# Patient Record
Sex: Male | Born: 1990
Health system: Southern US, Community
[De-identification: ages and names within clinical notes are randomized; demographics above are authoritative.]

## PROBLEM LIST (undated history)

## (undated) DIAGNOSIS — F419 Anxiety disorder, unspecified: Secondary | ICD-10-CM

## (undated) DIAGNOSIS — E0500 Thyrotoxicosis with diffuse goiter without thyrotoxic crisis or storm: Secondary | ICD-10-CM

## (undated) DIAGNOSIS — E039 Hypothyroidism, unspecified: Secondary | ICD-10-CM

## (undated) DIAGNOSIS — R002 Palpitations: Secondary | ICD-10-CM

## (undated) DIAGNOSIS — K8301 Primary sclerosing cholangitis: Secondary | ICD-10-CM

## (undated) DIAGNOSIS — F1910 Other psychoactive substance abuse, uncomplicated: Secondary | ICD-10-CM

## (undated) DIAGNOSIS — K219 Gastro-esophageal reflux disease without esophagitis: Secondary | ICD-10-CM

## (undated) DIAGNOSIS — I8290 Acute embolism and thrombosis of unspecified vein: Secondary | ICD-10-CM

## (undated) DIAGNOSIS — F32A Depression, unspecified: Secondary | ICD-10-CM

## (undated) DIAGNOSIS — I10 Essential (primary) hypertension: Secondary | ICD-10-CM

## (undated) DIAGNOSIS — D689 Coagulation defect, unspecified: Secondary | ICD-10-CM

## (undated) DIAGNOSIS — E559 Vitamin D deficiency, unspecified: Secondary | ICD-10-CM

## (undated) DIAGNOSIS — E785 Hyperlipidemia, unspecified: Secondary | ICD-10-CM

## (undated) HISTORY — DX: Coagulation defect, unspecified: D68.9

## (undated) HISTORY — DX: Depression, unspecified: F32.A

## (undated) HISTORY — DX: Essential (primary) hypertension: I10

## (undated) HISTORY — DX: Anxiety disorder, unspecified: F41.9

## (undated) HISTORY — PX: WISDOM TOOTH EXTRACTION: SHX21

## (undated) HISTORY — DX: Thyrotoxicosis with diffuse goiter without thyrotoxic crisis or storm: E05.00

## (undated) HISTORY — DX: Acute embolism and thrombosis of unspecified vein: I82.90

## (undated) HISTORY — DX: Other psychoactive substance abuse, uncomplicated: F19.10

---
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
## 2011-11-20 ENCOUNTER — Ambulatory Visit: Payer: Self-pay | Admitting: Internal Medicine

## 2011-11-21 ENCOUNTER — Ambulatory Visit (INDEPENDENT_AMBULATORY_CARE_PROVIDER_SITE_OTHER): Payer: 59 | Admitting: Internal Medicine

## 2011-11-21 ENCOUNTER — Encounter: Payer: Self-pay | Admitting: Internal Medicine

## 2011-11-21 DIAGNOSIS — Z0000 Encounter for general adult medical examination without abnormal findings: Secondary | ICD-10-CM

## 2011-11-21 NOTE — Patient Instructions (Signed)
Please bring your immunization records

## 2011-11-21 NOTE — Progress Notes (Signed)
  Subjective:    Patient ID: Kyle Gonzales, male    DOB: Mar 06, 1991, 21 y.o.   MRN: [id number]  HPI CPX  Past medical history None Past surgical history L knee arthroscopy 2012, ACL MCL Social history Live w/parents, single, no children Not in school or working at present  Tobacco-- no ETOH--no Drugs-- marihuana rarely Family history Diabetes--no CAD--no Stroke--no Colon cancer--no Breast cancer--no Prostate cancer--no   Review of Systems Denies chest pain or shortness of breath Note nausea, vomiting, diarrhea. No dysuria or gross hematuria. Denies any anxiety or depression.     Objective:   Physical Exam  Constitutional: He is oriented to person, place, and time. He appears well-developed. No distress.  HENT:  Head: Normocephalic and atraumatic.  Neck: No thyromegaly present.  Cardiovascular: Normal rate, regular rhythm and normal heart sounds.   No murmur heard. Pulmonary/Chest: Effort normal and breath sounds normal. No respiratory distress. He has no wheezes. He has no rales.  Abdominal: Soft. Bowel sounds are normal. He exhibits no distension. There is no tenderness. There is no rebound.  Musculoskeletal: He exhibits no edema.  Neurological: He is alert and oriented to person, place, and time.  Skin: Skin is warm and dry. He is not diaphoretic.  Psychiatric: He has a normal mood and affect. His behavior is normal. Judgment and thought content normal.      Assessment & Plan:

## 2011-11-21 NOTE — Assessment & Plan Note (Addendum)
New patient, he is feeling well, physical exam normal. The patient declined blood tests. Patient is counseled today: Diet, exercise, self testicular exam, safe drive, safe sex. Also discuss the dangers of drugs, alcohol and tobacco use. Recommendation to bring [place] his immunization records.

## 2012-06-11 ENCOUNTER — Ambulatory Visit (INDEPENDENT_AMBULATORY_CARE_PROVIDER_SITE_OTHER): Payer: 59 | Admitting: Family Medicine

## 2012-06-11 ENCOUNTER — Encounter: Payer: Self-pay | Admitting: Family Medicine

## 2012-06-11 VITALS — BP 120/74 | HR 62 | Temp 98.2°F | Wt 226.2 lb

## 2012-06-11 DIAGNOSIS — S6990XA Unspecified injury of unspecified wrist, hand and finger(s), initial encounter: Secondary | ICD-10-CM

## 2012-06-11 DIAGNOSIS — S6980XA Other specified injuries of unspecified wrist, hand and finger(s), initial encounter: Secondary | ICD-10-CM

## 2012-06-11 NOTE — Progress Notes (Signed)
  Subjective:    Patient ID: Kyle Gonzales, male    DOB: 09/18/1991, 21 y.o.   MRN: [id number]  HPI Pt here c/o R middle finger being jammed during basketball game.  It is no longer painful but is swollen.     Review of Systems    as above Objective:   Physical Exam  Constitutional: He is oriented to person, place, and time. He appears well-developed and well-nourished.  Musculoskeletal: He exhibits edema. He exhibits no tenderness.       R middle finger--- pip joint swollen, no pain with palpation  Neurological: He is alert and oriented to person, place, and time.  Psychiatric: He has a normal mood and affect. His behavior is normal. Judgment and thought content normal.          Assessment & Plan:

## 2012-06-11 NOTE — Assessment & Plan Note (Signed)
Check xray Refer to ortho if swelling does not go down

## 2012-06-11 NOTE — Patient Instructions (Signed)
Jammed Finger  A jammed finger is a term used to describe a variety of injuries. The injuries usually involve the joint in the middle of the finger (not the joint near the tip of the finger, and not the joint close to the hand). Usually, a jammed finger involves injured tendons or ligaments (sprain).  CAUSES   "Jamming" a finger usually refers to "stubbing" the finger on an object, such as a ball during an athletic activity. Usually, the joint is extended at the time of injury, and the blow forces the joint further into extension than it normally goes.  SYMPTOMS   · Pain.  · Swelling.  · Discoloration and bruising around the joint.  · Difficulty bending, straightening, and using the finger normally.  DIAGNOSIS   An X-ray may be done to make sure there is no broken bone (fracture).  TREATMENT   · Put ice on the injured area.  · Put ice in a plastic bag.  · Place a towel between your skin and the bag.  · Leave the ice on for 15 to 20 minutes at a time, 3 to 4 times a day.  · Raise (elevate) the affected finger above the level of your heart to decrease swelling.  · Take medicine as directed by your caregiver.  Depending on the type of injury, your caregiver may also recommend that you:  · "Buddy tape" the injured finger to the finger or fingers beside it.  · Wear a protective splint.  · Do strengthening exercises after the finger has begun to heal.  · Do physical therapy to regain strength and mobility in the finger.  · Follow up with a hand specialist.  HOME CARE INSTRUCTIONS   Avoid activities that may injure the finger again until it is totally healed.  SEEK IMMEDIATE MEDICAL CARE IF:   · You develop pain that is more severe.  · You develop increased swelling.  · There is an obvious deformity in the joint.  · You have severe bruising.  · You have red or blue discoloration.  · You or your child has an oral temperature above 102° F (38.9° C), not controlled by medicine.  · You have an abnormally cold  finger.  · Feeling in your finger is absent or decreasing.  MAKE SURE YOU:   · Understand these instructions.  · Will watch your condition.  · Will get help right away if you are not doing well or get worse.  Document Released: 03/22/2010 Document Revised: 09/21/2011 Document Reviewed: 03/22/2010  [employer]® Patient Information ©2012 [employer].

## 2012-09-20 ENCOUNTER — Telehealth: Payer: Self-pay

## 2012-09-20 DIAGNOSIS — M79644 Pain in right finger(s): Secondary | ICD-10-CM

## 2012-09-20 NOTE — Telephone Encounter (Signed)
Pain is going on for months, recommend ortho referral, please arrange. They can do the x-ray.

## 2012-09-20 NOTE — Telephone Encounter (Signed)
Pt mom LMOVM stating pt finger still not better requesting referral for xray to be done. PLz advise   MW WU#[id number]

## 2012-09-20 NOTE — Telephone Encounter (Signed)
lmovm for pt to return call. Entered referral

## 2012-09-20 NOTE — Telephone Encounter (Signed)
Please advise 

## 2014-11-24 ENCOUNTER — Ambulatory Visit (INDEPENDENT_AMBULATORY_CARE_PROVIDER_SITE_OTHER): Payer: 59 | Admitting: Internal Medicine

## 2014-11-24 ENCOUNTER — Encounter: Payer: Self-pay | Admitting: Internal Medicine

## 2014-11-24 VITALS — BP 138/82 | HR 64 | Temp 99.0°F | Resp 16 | Ht 75.0 in | Wt 227.0 lb

## 2014-11-24 DIAGNOSIS — E785 Hyperlipidemia, unspecified: Secondary | ICD-10-CM

## 2014-11-24 DIAGNOSIS — R945 Abnormal results of liver function studies: Secondary | ICD-10-CM

## 2014-11-24 DIAGNOSIS — E559 Vitamin D deficiency, unspecified: Secondary | ICD-10-CM

## 2014-11-24 DIAGNOSIS — Z79899 Other long term (current) drug therapy: Secondary | ICD-10-CM

## 2014-11-24 DIAGNOSIS — R7309 Other abnormal glucose: Secondary | ICD-10-CM

## 2014-11-24 DIAGNOSIS — R7989 Other specified abnormal findings of blood chemistry: Secondary | ICD-10-CM

## 2014-11-24 DIAGNOSIS — R5383 Other fatigue: Secondary | ICD-10-CM

## 2014-11-24 LAB — LIPID PANEL
CHOLESTEROL: 176 mg/dL (ref 0–200)
HDL: 62 mg/dL (ref >39)
LDL Cholesterol: 100 mg/dL — ABNORMAL HIGH (ref 0–99)
TRIGLYCERIDES: 68 mg/dL (ref <150)
Total CHOL/HDL Ratio: 2.8 Ratio
VLDL: 14 mg/dL (ref 0–40)

## 2014-11-24 LAB — BASIC METABOLIC PANEL WITH GFR
BUN: 9 mg/dL (ref 6–23)
CALCIUM: 9.9 mg/dL (ref 8.4–10.5)
CO2: 26 meq/L (ref 19–32)
CREATININE: 0.88 mg/dL (ref 0.50–1.35)
Chloride: 105 mEq/L (ref 96–112)
Glucose, Bld: 94 mg/dL (ref 70–99)
Potassium: 4 mEq/L (ref 3.5–5.3)
Sodium: 141 mEq/L (ref 135–145)

## 2014-11-24 LAB — HEPATITIS B SURFACE ANTIBODY,QUALITATIVE: HEP B S AB: NEGATIVE

## 2014-11-24 LAB — IRON AND TIBC
%SAT: 15 % — AB (ref 20–55)
Iron: 61 ug/dL (ref 42–165)
TIBC: 408 ug/dL (ref 215–435)
UIBC: 347 ug/dL (ref 125–400)

## 2014-11-24 LAB — HEPATIC FUNCTION PANEL
ALBUMIN: 4.9 g/dL (ref 3.5–5.2)
ALK PHOS: 59 U/L (ref 39–117)
ALT: 51 U/L (ref 0–53)
AST: 28 U/L (ref 0–37)
BILIRUBIN DIRECT: 0.1 mg/dL (ref 0.0–0.3)
Indirect Bilirubin: 0.5 mg/dL (ref 0.2–1.2)
Total Bilirubin: 0.6 mg/dL (ref 0.2–1.2)
Total Protein: 7.7 g/dL (ref 6.0–8.3)

## 2014-11-24 LAB — HEMOGLOBIN A1C
Hgb A1c MFr Bld: 5.7 % — ABNORMAL HIGH (ref <5.7)
MEAN PLASMA GLUCOSE: 117 mg/dL — AB (ref <117)

## 2014-11-24 LAB — HEPATITIS B CORE ANTIBODY, TOTAL: HEP B C TOTAL AB: NONREACTIVE

## 2014-11-24 LAB — MAGNESIUM: Magnesium: 2.1 mg/dL (ref 1.5–2.5)

## 2014-11-24 LAB — HEPATITIS C ANTIBODY: HCV Ab: NEGATIVE

## 2014-11-24 LAB — HEPATITIS A ANTIBODY, TOTAL: Hep A Total Ab: NONREACTIVE

## 2014-11-24 NOTE — Progress Notes (Signed)
Patient ID: Letcher Schweikert, male   DOB: [birth year], 24 y.o.   MRN: [id number]   Annual Screening Comprehensive Examination   This very nice 24 y.o. SBM presents to establish as a new patient and for complete physical.  Patient has no major health issues.   No Current Meds   No Known Allergies  Neg past medical history   DT at age 24 yo per pt's mother   Past Surgical History  Procedure Laterality Date  . Anterior cruciate ligament repair  2012   FH (+) for GER in Father & HTN & Thyroid Dz in Mother  History   Social History  . Marital Status: Single   Occupational History  . Works as a Higher education careers adviser for [employer] History Main Topics  . Smoking status: Never Smoker   . Smokeless tobacco: Never Used  . Alcohol Use: No  . Drug Use: No  . Sexual Activity: Not on file     ROS Constitutional: Denies fever, chills, weight loss/gain, headaches, insomnia, fatigue, night sweats, and change in appetite. Eyes: Denies redness, blurred vision, diplopia, discharge, itchy, watery eyes.  ENT: Denies discharge, congestion, post nasal drip, epistaxis, sore throat, earache, hearing loss, dental pain, Tinnitus, Vertigo, Sinus pain, snoring.  Cardio: Denies chest pain, palpitations, irregular heartbeat, syncope, dyspnea, diaphoresis, orthopnea, PND, claudication, edema Respiratory: denies cough, dyspnea, DOE, pleurisy, hoarseness, laryngitis, wheezing.  Gastrointestinal: Denies dysphagia, heartburn, reflux, water brash, pain, cramps, nausea, vomiting, bloating, diarrhea, constipation, hematemesis, melena, hematochezia, jaundice, hemorrhoids Genitourinary: Denies dysuria, frequency, urgency, nocturia, hesitancy, discharge, hematuria, flank pain Breast: Breast lumps, nipple discharge, bleeding.  Musculoskeletal: Denies arthralgia, myalgia, stiffness, Jt. Swelling, pain, limp, and strain/sprain. Skin: Denies puritis, rash, hives, warts, acne, eczema, changing in skin lesion Neuro: Weakness, tremor,  incoordination, spasms, paresthesia, pain Psychiatric: Denies confusion, memory loss, sensory loss. Endocrine: Denies change in weight, skin, hair change, nocturia, and paresthesia, diabetic polys, visual blurring, hyper /hypo glycemic episodes.  Heme/Lymph: No excessive bleeding, bruising, enlarged lymph nodes.   Physical Exam  BP 138/82   Pulse 64  Temp 99 F  Resp 16  Ht 6\' 3"    Wt 227 lb     BMI 28.37  General Appearance: Well nourished and in no apparent distress. Eyes: PERRLA, EOMs, conjunctiva no swelling or erythema, normal fundi and vessels. Sinuses: No frontal/maxillary tenderness ENT/Mouth: EACs patent / TMs  nl. Nares clear without erythema, swelling, mucoid exudates. Oral hygiene is good. No erythema, swelling, or exudate. Tongue normal, non-obstructing. Tonsils not swollen or erythematous. Hearing normal.  Neck: Supple, thyroid normal. No bruits, nodes or JVD. Respiratory: Respiratory effort normal.  BS equal and clear bilateral without rales, rhonci, wheezing or stridor. Cardio: Heart sounds are normal with regular rate and rhythm and no murmurs, rubs or gallops. Peripheral pulses are normal and equal bilaterally without edema. No aortic or femoral bruits. Chest: symmetric with normal excursions and percussion. Abdomen: Flat, soft, with bowl sounds. Nontender, no guarding, rebound, hernias, masses, or organomegaly.  Lymphatics: Non tender without lymphadenopathy.  Genitourinary: Nl Male. testes Nl. No Ing Hernia. Musculoskeletal: Full ROM all peripheral extremities, joint stability, 5/5 strength, and normal gait. Skin: Warm and dry without rashes, lesions, cyanosis, clubbing or  ecchymosis.  Neuro: Cranial nerves intact, reflexes equal bilaterally. Normal muscle tone, no cerebellar symptoms. Sensation intact.  Pysch: Awake and oriented X 3, normal affect, Insight and Judgment appropriate.   Assessment and Plan  1.  Annual Screening Examination 2.  Screening elev  BP 3.  Screening  Abn. Lipids 4.  Screening Abn LFT's/ Viral Hepatitis  5.  Screening Vit D Deficiency  Continue prudent diet as discussed, weight control, regular exercise, and medications. Routine screening labs and tests as requested with regular follow-up as recommended.

## 2014-11-24 NOTE — Patient Instructions (Signed)
Vitamin D Deficiency Vitamin D is an important vitamin that your body needs. Having too little of it in your body is called a deficiency. A very bad deficiency can make your bones soft and can cause a condition called rickets.  Vitamin D is important to your body for different reasons, such as:   It helps your body absorb 2 minerals called calcium and phosphorus.  It helps make your bones healthy.  It may prevent some diseases, such as diabetes and multiple sclerosis.  It helps your muscles and heart. You can get vitamin D in several ways. It is a natural part of some foods. The vitamin is also added to some dairy products and cereals. Some people take vitamin D supplements. Also, your body makes vitamin D when you are in the sun. It changes the sun's rays into a form of the vitamin that your body can use. CAUSES   Not eating enough foods that contain vitamin D.  Not getting enough sunlight.  Having certain digestive system diseases that make it hard to absorb vitamin D. These diseases include Crohn's disease, chronic pancreatitis, and cystic fibrosis.  Having a surgery in which part of the stomach or small intestine is removed.  Being obese. Fat cells pull vitamin D out of your blood. That means that obese people may not have enough vitamin D left in their blood and in other body tissues.  Having chronic kidney or liver disease. RISK FACTORS Risk factors are things that make you more likely to develop a vitamin D deficiency. They include:  Being older.  Not being able to get outside very much.  Living in a nursing home.  Having had broken bones.  Having weak or thin bones (osteoporosis).  Having a disease or condition that changes how your body absorbs vitamin D.  Having dark skin.  Some medicines such as seizure medicines or steroids.  Being overweight or obese. SYMPTOMS Mild cases of vitamin D deficiency may not have any symptoms. If you have a very bad case, symptoms  may include:  Bone pain.  Muscle pain.  Falling often.  Broken bones caused by a minor injury, due to osteoporosis. DIAGNOSIS A blood test is the best way to tell if you have a vitamin D deficiency. TREATMENT Vitamin D deficiency can be treated in different ways. Treatment for vitamin D deficiency depends on what is causing it. Options include:  Taking vitamin D supplements.  Taking a calcium supplement. Your caregiver will suggest what dose is best for you. HOME CARE INSTRUCTIONS  Take any supplements that your caregiver prescribes. Follow the directions carefully. Take only the suggested amount.  Have your blood tested 2 months after you start taking supplements.  Eat foods that contain vitamin D. Healthy choices include:  Fortified dairy products, cereals, or juices. Fortified means vitamin D has been added to the food. Check the label on the package to be sure.  Fatty fish like salmon or trout.  Eggs.  Oysters.  Do not use a tanning bed.  Keep your weight at a healthy level. Lose weight if you need to.  Keep all follow-up appointments. Your caregiver will need to perform blood tests to make sure your vitamin D deficiency is going away. SEEK MEDICAL CARE IF:  You have any questions about your treatment.  You continue to have symptoms of vitamin D deficiency.  You have nausea or vomiting.  You are constipated.  You feel confused.  You have severe abdominal or back pain. MAKE  SURE YOU:  Understand these instructions.  Will watch your condition.  Will get help right away if you are not doing well or get worse. Document Released: 12/25/2011 Document Revised: 01/27/2013 Document Reviewed: 12/25/2011 [facility] Patient Information 2015 [place], [place]. This information is not intended to replace advice given to you by your health care provider. Make sure you discuss any questions you have with your health care  provider.  +++++++++++++++++++++++++++++++++++++++++++++++++++  Recommend the book "The END of DIETING" by Dr Excell Seltzer   & the book "The END of DIABETES " by Dr Excell Seltzer  At [email or web address] - get book & Audio CD's      Being diabetic has a  300% increased risk for heart attack, stroke, cancer, and alzheimer- type vascular dementia. It is very important that you work harder with diet by avoiding all foods that are white. Avoid white rice (brown & wild rice is OK), white potatoes (sweetpotatoes in moderation is OK), White bread or wheat bread or anything made out of white flour like bagels, donuts, rolls, buns, biscuits, cakes, pastries, cookies, pizza crust, and pasta (made from white flour & egg whites) - vegetarian pasta or spinach or wheat pasta is OK. Multigrain breads like Arnold's or [employer], or multigrain sandwich thins or flatbreads.  Diet, exercise and weight loss can reverse and cure diabetes in the early stages.  Diet, exercise and weight loss is very important in the control and prevention of complications of diabetes which affects every system in your body, ie. Brain - dementia/stroke, eyes - glaucoma/blindness, heart - heart attack/heart failure, kidneys - dialysis, stomach - gastric paralysis, intestines - malabsorption, nerves - severe painful neuritis, circulation - gangrene & loss of a leg(s), and finally cancer and Alzheimers.    I recommend avoid fried & greasy foods,  sweets/candy, white rice (brown or wild rice or Quinoa is OK), white potatoes (sweet potatoes are OK) - anything made from white flour - bagels, doughnuts, rolls, buns, biscuits,white and wheat breads, pizza crust and traditional pasta made of white flour & egg white(vegetarian pasta or spinach or wheat pasta is OK).  Multi-grain bread is OK - like multi-grain flat bread or sandwich thins. Avoid alcohol in excess. Exercise is also important.    Eat all the vegetables you want - avoid meat, especially red  meat and dairy - especially cheese.  Cheese is the most concentrated form of trans-fats which is the worst thing to clog up our arteries. Veggie cheese is OK which can be found in the fresh produce section at [facility] or [employer] or [employer]   ++++++++++++++++++++++++++++++++++++++++++++++++  Preventive Care for Adults A healthy lifestyle and preventive care can promote health and wellness. Preventive health guidelines for men include the following key practices:  A routine yearly physical is a good way to check with your health care provider about your health and preventative screening. It is a chance to share any concerns and updates on your health and to receive a thorough exam.  Visit your dentist for a routine exam and preventative care every 6 months. Brush your teeth twice a day and floss once a day. Good oral hygiene prevents tooth decay and gum disease.  The frequency of eye exams is based on your age, health, family medical history, use of contact lenses, and other factors. Follow your health care provider's recommendations for frequency of eye exams.  Eat a healthy diet. Foods such as vegetables, fruits, whole grains, low-fat dairy products, and lean protein foods contain  the nutrients you need without too many calories. Decrease your intake of foods high in solid fats, added sugars, and salt. Eat the right amount of calories for you.Get information about a proper diet from your health care provider, if necessary.  Regular physical exercise is one of the most important things you can do for your health. Most adults should get at least 150 minutes of moderate-intensity exercise (any activity that increases your heart rate and causes you to sweat) each week. In addition, most adults need muscle-strengthening exercises on 2 or more days a week.  Maintain a healthy weight. The body mass index (BMI) is a screening tool to identify possible weight problems. It provides an estimate of  body fat based on height and weight. Your health care provider can find your BMI and can help you achieve or maintain a healthy weight.For adults 20 years and older:  A BMI below 18.5 is considered underweight.  A BMI of 18.5 to 24.9 is normal.  A BMI of 25 to 29.9 is considered overweight.  A BMI of 30 and above is considered obese.  Maintain normal blood lipids and cholesterol levels by exercising and minimizing your intake of saturated fat. Eat a balanced diet with plenty of fruit and vegetables. Blood tests for lipids and cholesterol should begin at age 80 and be repeated every 5 years. If your lipid or cholesterol levels are high, you are over 50, or you are at high risk for heart disease, you may need your cholesterol levels checked more frequently.Ongoing high lipid and cholesterol levels should be treated with medicines if diet and exercise are not working.  If you smoke, find out from your health care provider how to quit. If you do not use tobacco, do not start.  Lung cancer screening is recommended for adults aged 30-80 years who are at high risk for developing lung cancer because of a history of smoking. A yearly low-dose CT scan of the lungs is recommended for people who have at least a 30-pack-year history of smoking and are a current smoker or have quit within the past 15 years. A pack year of smoking is smoking an average of 1 pack of cigarettes a day for 1 year (for example: 1 pack a day for 30 years or 2 packs a day for 15 years). Yearly screening should continue until the smoker has stopped smoking for at least 15 years. Yearly screening should be stopped for people who develop a health problem that would prevent them from having lung cancer treatment.  If you choose to drink alcohol, do not have more than 2 drinks per day. One drink is considered to be 12 ounces (355 mL) of beer, 5 ounces (148 mL) of wine, or 1.5 ounces (44 mL) of liquor.  High blood pressure causes heart  disease and increases the risk of stroke. Your blood pressure should be checked. Ongoing high blood pressure should be treated with medicines, if weight loss and exercise are not effective.  If you are 29-24 years old, ask your health care provider if you should take aspirin to prevent heart disease.  Diabetes screening involves taking a blood sample to check your fasting blood sugar level. Testing should be considered at a younger age or be carried out more frequently if you are overweight and have at least 1 risk factor for diabetes.  Colorectal cancer can be detected and often prevented. Most routine colorectal cancer screening begins at the age of 31 and continues through age  75. However, your health care provider may recommend screening at an earlier age if you have risk factors for colon cancer. On a yearly basis, your health care provider may provide home test kits to check for hidden blood in the stool. Use of a small camera at the end of a tube to directly examine the colon (sigmoidoscopy or colonoscopy) can detect the earliest forms of colorectal cancer. Talk to your health care provider about this at age 21, when routine screening begins. Direct exam of the colon should be repeated every 5-10 years through age 3, unless early forms of precancerous polyps or small growths are found.  Screening for abdominal aortic aneurysm (AAA)  are recommended for persons over age 20 who have history of hypertensionor who are current or former smokers.  Talk with your health care provider about prostate cancer screening.  Testicular cancer screening is recommended for adult males. Screening includes self-exam, a health care provider exam, and other screening tests. Consult with your health care provider about any symptoms you have or any concerns you have about testicular cancer.  Use sunscreen. Apply sunscreen liberally and repeatedly throughout the day. You should seek shade when your shadow is shorter  than you. Protect yourself by wearing long sleeves, pants, a wide-brimmed hat, and sunglasses year round, whenever you are outdoors.  Once a month, do a whole-body skin exam, using a mirror to look at the skin on your back. Tell your health care provider about new moles, moles that have irregular borders, moles that are larger than a pencil eraser, or moles that have changed in shape or color.  Stay current with required vaccines (immunizations).  Influenza vaccine. All adults should be immunized every year.  Tetanus, diphtheria, and acellular pertussis (Td, Tdap) vaccine. An adult who has not previously received Tdap or who does not know his vaccine status should receive 1 dose of Tdap. This initial dose should be followed by tetanus and diphtheria toxoids (Td) booster doses every 10 years. Adults with an unknown or incomplete history of completing a 3-dose immunization series with Td-containing vaccines should begin or complete a primary immunization series including a Tdap dose. Adults should receive a Td booster every 10 years.  Zoster vaccine. One dose is recommended for adults aged 39 years or older unless certain conditions are present.    Pneumococcal 13-valent conjugate (PCV13) vaccine. When indicated, a person who is uncertain of his immunization history and has no record of immunization should receive the PCV13 vaccine. An adult aged 25 years or older who has certain medical conditions and has not been previously immunized should receive 1 dose of PCV13 vaccine. This PCV13 should be followed with a dose of pneumococcal polysaccharide (PPSV23) vaccine. The PPSV23 vaccine dose should be obtained at least 8 weeks after the dose of PCV13 vaccine. An adult aged 73 years or older who has certain medical conditions and previously received 1 or more doses of PPSV23 vaccine should receive 1 dose of PCV13. The PCV13 vaccine dose should be obtained 1 or more years after the last PPSV23 vaccine  dose.    Pneumococcal polysaccharide (PPSV23) vaccine. When PCV13 is also indicated, PCV13 should be obtained first. All adults aged 62 years and older should be immunized. An adult younger than age 68 years who has certain medical conditions should be immunized. Any person who resides in a nursing home or long-term care facility should be immunized. An adult smoker should be immunized. People with an immunocompromised condition and certain other  conditions should receive both PCV13 and PPSV23 vaccines. People with human immunodeficiency virus (HIV) infection should be immunized as soon as possible after diagnosis. Immunization during chemotherapy or radiation therapy should be avoided. Routine use of PPSV23 vaccine is not recommended for American Indians, [place] Natives, or people younger than 65 years unless there are medical conditions that require PPSV23 vaccine. When indicated, people who have unknown immunization and have no record of immunization should receive PPSV23 vaccine. One-time revaccination 5 years after the first dose of PPSV23 is recommended for people aged 19-64 years who have chronic kidney failure, nephrotic syndrome, asplenia, or immunocompromised conditions. People who received 1-2 doses of PPSV23 before age 17 years should receive another dose of PPSV23 vaccine at age 99 years or later if at least 5 years have passed since the previous dose. Doses of PPSV23 are not needed for people immunized with PPSV23 at or after age 39 years.  Hepatitis A vaccine. Adults who wish to be protected from this disease, have certain high-risk conditions, work with hepatitis A-infected animals, work in hepatitis A research labs, or travel to or work in countries with a high rate of hepatitis A should be immunized. Adults who were previously unvaccinated and who anticipate close contact with an international adoptee during the first 60 days after arrival in the [place] from a country with a high rate  of hepatitis A should be immunized.  Hepatitis B vaccine. Adults should be immunized if they wish to be protected from this disease, have certain high-risk conditions, may be exposed to blood or other infectious body fluids, are household contacts or sex partners of hepatitis B positive people, are clients or workers in certain care facilities, or travel to or work in countries with a high rate of hepatitis B.  Preventive Service / Frequency  Ages 78 to 48  Blood pressure check.  Lipid and cholesterol check.  Hepatitis C blood test.** / For any individual with known risks for hepatitis C.  Skin self-exam. / Monthly.  Influenza vaccine. / Every year.  Tetanus, diphtheria, and acellular pertussis (Tdap, Td) vaccine.** / Consult your health care provider. 1 dose of Td every 10 years.  HPV vaccine. / 3 doses over 6 months, if 63 or younger.  Measles, mumps, rubella (MMR) vaccine.** / You need at least 1 dose of MMR if you were born in 1957 or later. You may also need a second dose.  Pneumococcal 13-valent conjugate (PCV13) vaccine.** / Consult your health care provider.  Pneumococcal polysaccharide (PPSV23) vaccine.** / 1 to 2 doses if you smoke cigarettes or if you have certain conditions.  Meningococcal vaccine.** / 1 dose if you are age 57 to 26 years and a Market researcher living in a residence hall, or have one of several medical conditions. You may also need additional booster doses.  Hepatitis A vaccine.** / Consult your health care provider.  Hepatitis B vaccine.** / Consult your health care provider.

## 2014-11-25 LAB — URINALYSIS, MICROSCOPIC ONLY
Bacteria, UA: NONE SEEN
Casts: NONE SEEN
Crystals: NONE SEEN
SQUAMOUS EPITHELIAL / LPF: NONE SEEN

## 2014-11-25 LAB — CBC WITH DIFFERENTIAL/PLATELET
Basophils Absolute: 0 10*3/uL (ref 0.0–0.1)
Basophils Relative: 0 % (ref 0–1)
Eosinophils Absolute: 0 10*3/uL (ref 0.0–0.7)
Eosinophils Relative: 1 % (ref 0–5)
HEMATOCRIT: 43.4 % (ref 39.0–52.0)
HEMOGLOBIN: 14.3 g/dL (ref 13.0–17.0)
LYMPHS ABS: 0.8 10*3/uL (ref 0.7–4.0)
Lymphocytes Relative: 20 % (ref 12–46)
MCH: 28.9 pg (ref 26.0–34.0)
MCHC: 32.9 g/dL (ref 30.0–36.0)
MCV: 87.7 fL (ref 78.0–100.0)
MPV: 10.5 fL (ref 8.6–12.4)
Monocytes Absolute: 0.3 10*3/uL (ref 0.1–1.0)
Monocytes Relative: 6 % (ref 3–12)
NEUTROS ABS: 3.1 10*3/uL (ref 1.7–7.7)
NEUTROS PCT: 73 % (ref 43–77)
Platelets: 110 10*3/uL — ABNORMAL LOW (ref 150–400)
RBC: 4.95 MIL/uL (ref 4.22–5.81)
RDW: 15.2 % (ref 11.5–15.5)
WBC: 4.2 10*3/uL (ref 4.0–10.5)

## 2014-11-25 LAB — TESTOSTERONE: TESTOSTERONE: 450 ng/dL (ref 300–890)

## 2014-11-25 LAB — TSH: TSH: 2.059 u[IU]/mL (ref 0.350–4.500)

## 2014-11-25 LAB — INSULIN, FASTING: Insulin fasting, serum: 9.6 u[IU]/mL (ref 2.0–19.6)

## 2014-11-25 LAB — VITAMIN D 25 HYDROXY (VIT D DEFICIENCY, FRACTURES): Vit D, 25-Hydroxy: 7 ng/mL — ABNORMAL LOW (ref 30–100)

## 2014-11-25 LAB — VITAMIN B12: VITAMIN B 12: 580 pg/mL (ref 211–911)

## 2014-11-26 LAB — HEPATITIS B E ANTIBODY: HEPATITIS BE ANTIBODY: NONREACTIVE

## 2015-08-11 ENCOUNTER — Encounter: Payer: Self-pay | Admitting: Physician Assistant

## 2015-08-11 ENCOUNTER — Ambulatory Visit (INDEPENDENT_AMBULATORY_CARE_PROVIDER_SITE_OTHER): Payer: 59 | Admitting: Physician Assistant

## 2015-08-11 VITALS — BP 120/60 | HR 101 | Temp 98.2°F | Resp 16 | Ht 75.0 in | Wt 189.0 lb

## 2015-08-11 DIAGNOSIS — R945 Abnormal results of liver function studies: Secondary | ICD-10-CM

## 2015-08-11 DIAGNOSIS — R112 Nausea with vomiting, unspecified: Secondary | ICD-10-CM | POA: Diagnosis not present

## 2015-08-11 DIAGNOSIS — E559 Vitamin D deficiency, unspecified: Secondary | ICD-10-CM | POA: Diagnosis not present

## 2015-08-11 DIAGNOSIS — E0590 Thyrotoxicosis, unspecified without thyrotoxic crisis or storm: Secondary | ICD-10-CM

## 2015-08-11 DIAGNOSIS — R634 Abnormal weight loss: Secondary | ICD-10-CM | POA: Diagnosis not present

## 2015-08-11 DIAGNOSIS — R5383 Other fatigue: Secondary | ICD-10-CM | POA: Diagnosis not present

## 2015-08-11 DIAGNOSIS — Z1159 Encounter for screening for other viral diseases: Secondary | ICD-10-CM

## 2015-08-11 DIAGNOSIS — R7989 Other specified abnormal findings of blood chemistry: Secondary | ICD-10-CM

## 2015-08-11 LAB — CBC WITH DIFFERENTIAL/PLATELET
Basophils Absolute: 0 10*3/uL (ref 0.0–0.1)
Basophils Relative: 0 % (ref 0–1)
EOS ABS: 0.1 10*3/uL (ref 0.0–0.7)
Eosinophils Relative: 2 % (ref 0–5)
HEMATOCRIT: 39.9 % (ref 39.0–52.0)
Hemoglobin: 13.9 g/dL (ref 13.0–17.0)
LYMPHS ABS: 1.2 10*3/uL (ref 0.7–4.0)
LYMPHS PCT: 23 % (ref 12–46)
MCH: 28.4 pg (ref 26.0–34.0)
MCHC: 34.8 g/dL (ref 30.0–36.0)
MCV: 81.4 fL (ref 78.0–100.0)
MONOS PCT: 15 % — AB (ref 3–12)
MPV: 10.4 fL (ref 8.6–12.4)
Monocytes Absolute: 0.8 10*3/uL (ref 0.1–1.0)
Neutro Abs: 3 10*3/uL (ref 1.7–7.7)
Neutrophils Relative %: 60 % (ref 43–77)
PLATELETS: 159 10*3/uL (ref 150–400)
RBC: 4.9 MIL/uL (ref 4.22–5.81)
RDW: 13.1 % (ref 11.5–15.5)
WBC: 5 10*3/uL (ref 4.0–10.5)

## 2015-08-11 MED ORDER — ONDANSETRON HCL 4 MG PO TABS: 4.0000 mg | ORAL_TABLET | Freq: Three times a day (TID) | ORAL | Status: DC | PRN

## 2015-08-11 NOTE — Patient Instructions (Signed)

## 2015-08-11 NOTE — Progress Notes (Addendum)
Subjective:    Patient ID: Kyle Gonzales, male    DOB: [birth year], 24 y.o.   MRN: [id number]  HPI 24 y.o. AAM presents with weight loss, N/V. He was 227 back in Feb for a physical and he is now down to 189 today. He has not been trying to lose weight. He states for the last month he has been having vomiting. He is vomiting once a day, digested food, occ with or without food, has no appetite and decreased energy. Has had some allergy symptoms last month. Has had some SOB with playing basketball.  He normally is eating 2 times a day, will do sandwiches, hot pockets.  He does smoke mariajuana out of cigars, no other drug use, does not drink alcohol. Has been sexually active, not right now, 61 male partners. No fever, chills, night sweats, AB pain, diarrhea, constipation, CP. Had negative Hep panel in Feb with normal kidney, TSH, liver, CBC other than thrombocytopenia and pre DM.   Wt Readings from Last 5 Encounters:  08/11/15 189 lb (85.73 kg)  11/24/14 227 lb (102.967 kg)  06/11/12 226 lb 3.2 oz (102.604 kg) (98 %*, Z = 1.99)  11/21/11 240 lb (108.863 kg) (99 %*, Z = 2.26)   * Growth percentiles are based on CDC 2-20 Years data.   Blood pressure 120/60, pulse 101, temperature 98.2 F (36.8 C), temperature source Temporal, resp. rate 16, height 6\' 3"  (1.905 m), weight 189 lb (85.73 kg), SpO2 98 %.   Review of Systems  Constitutional: Positive for appetite change, fatigue and unexpected weight change. Negative for fever, chills, diaphoresis and activity change.  HENT: Negative.  Negative for trouble swallowing.   Respiratory: Positive for shortness of breath (with exertion). Negative for apnea, cough, choking, chest tightness, wheezing and stridor.   Cardiovascular: Negative.  Negative for chest pain and leg swelling.  Gastrointestinal: Positive for nausea, vomiting and abdominal distention. Negative for abdominal pain, diarrhea, constipation, blood in stool, anal bleeding and rectal pain.   Endocrine: Negative.  Negative for polydipsia, polyphagia and polyuria.  Genitourinary: Negative.   Musculoskeletal: Negative.  Negative for myalgias and arthralgias.  Skin: Negative.  Negative for rash.  Neurological: Negative.  Negative for dizziness.  Hematological: Negative.   Psychiatric/Behavioral: Negative.        Objective:   Physical Exam  Constitutional: He appears well-developed. No distress.  HENT:  Head: Normocephalic and atraumatic.  Mouth/Throat: No oropharyngeal exudate.  Eyes: Conjunctivae and EOM are normal. Pupils are equal, round, and reactive to light.  Neck: Normal range of motion. Neck supple. Thyromegaly (nontender but full thyroid) present.  Cardiovascular: Regular rhythm, normal heart sounds and normal pulses.   No extrasystoles are present. Tachycardia present.  PMI is not displaced.   Pulmonary/Chest: Breath sounds normal. No respiratory distress.  Course breath sounds  Abdominal: Soft. Normal appearance and bowel sounds are normal. He exhibits no distension and no mass. There is tenderness in the right lower quadrant. There is rebound (mild rebound). There is no rigidity, no guarding and no tenderness at McBurney's point.  Lymphadenopathy:    He has no cervical adenopathy.      Assessment & Plan:  Weight loss, fatigue, N/V Some mild right sided lower AB pain with mild questionable rebound, no other peritoneal signs.  Check CBC, CMET, TSH, HIV, mono, celiac, no diarrhea to indicate IBD, rule out malignancy.  Will get CXR with smoking history rule out malignancy/TB.  May need CT AB with N/V and RLQ pending labs  zofran for nausea  Any AB pain go to ER.   Addendum: Patient with hyperthyroidism, will get RAI uptake to differentiate between thyroiditis or graves/hyperfunctioning nodule, will start on prednisone.  Elevated LFTs, no alcohol/tylenol use- will get AB [place]

## 2015-08-12 ENCOUNTER — Other Ambulatory Visit: Payer: Self-pay | Admitting: Physician Assistant

## 2015-08-12 LAB — HEPATIC FUNCTION PANEL
ALK PHOS: 112 U/L (ref 40–115)
ALT: 225 U/L — ABNORMAL HIGH (ref 9–46)
AST: 105 U/L — AB (ref 10–40)
Albumin: 4.6 g/dL (ref 3.6–5.1)
BILIRUBIN DIRECT: 0.6 mg/dL — AB (ref <=0.2)
BILIRUBIN INDIRECT: 1.1 mg/dL (ref 0.2–1.2)
BILIRUBIN TOTAL: 1.7 mg/dL — AB (ref 0.2–1.2)
Total Protein: 7.4 g/dL (ref 6.1–8.1)

## 2015-08-12 LAB — EPSTEIN-BARR VIRUS VCA ANTIBODY PANEL
EBV EA IgG: <5 U/mL (ref <9.0)
EBV NA IgG: 374 U/mL — ABNORMAL HIGH (ref <18.0)
EBV VCA IgM: <10 U/mL (ref <36.0)

## 2015-08-12 LAB — BASIC METABOLIC PANEL WITH GFR
BUN: 9 mg/dL (ref 7–25)
CHLORIDE: 100 mmol/L (ref 98–110)
CO2: 28 mmol/L (ref 20–31)
CREATININE: 0.62 mg/dL (ref 0.60–1.35)
Calcium: 10.7 mg/dL — ABNORMAL HIGH (ref 8.6–10.3)
GFR, Est African American: >89 mL/min (ref >=60)
GFR, Est Non African American: >89 mL/min (ref >=60)
Glucose, Bld: 92 mg/dL (ref 65–99)
Potassium: 4.3 mmol/L (ref 3.5–5.3)
Sodium: 140 mmol/L (ref 135–146)

## 2015-08-12 LAB — HIV ANTIBODY (ROUTINE TESTING W REFLEX): HIV: NONREACTIVE

## 2015-08-12 LAB — TSH

## 2015-08-12 LAB — HEMOGLOBIN A1C
Hgb A1c MFr Bld: 5.3 % (ref <5.7)
Mean Plasma Glucose: 105 mg/dL (ref <117)

## 2015-08-12 LAB — GLIA (IGA/G) + TTG IGA
GLIADIN IGA: 9 U (ref <20)
Gliadin IgG: 4 Units (ref <20)
TISSUE TRANSGLUTAMINASE AB, IGA: 1 U/mL (ref <4)

## 2015-08-12 NOTE — Addendum Note (Signed)
Addended by: Vicie Mutters R on: 08/12/2015 08:47 AM   Modules accepted: Orders

## 2015-08-13 ENCOUNTER — Ambulatory Visit (HOSPITAL_COMMUNITY)
Admission: RE | Admit: 2015-08-13 | Discharge: 2015-08-13 | Disposition: A | Discharge status: Home / Self Care | Payer: 59 | Source: Ambulatory Visit | Attending: Physician Assistant | Admitting: Physician Assistant

## 2015-08-13 DIAGNOSIS — R109 Unspecified abdominal pain: Secondary | ICD-10-CM | POA: Insufficient documentation

## 2015-08-13 DIAGNOSIS — R634 Abnormal weight loss: Secondary | ICD-10-CM | POA: Diagnosis not present

## 2015-08-13 DIAGNOSIS — K828 Other specified diseases of gallbladder: Secondary | ICD-10-CM | POA: Insufficient documentation

## 2015-08-13 DIAGNOSIS — R7989 Other specified abnormal findings of blood chemistry: Secondary | ICD-10-CM | POA: Diagnosis present

## 2015-08-13 DIAGNOSIS — R945 Abnormal results of liver function studies: Secondary | ICD-10-CM

## 2015-08-13 DIAGNOSIS — R112 Nausea with vomiting, unspecified: Secondary | ICD-10-CM

## 2015-08-17 ENCOUNTER — Encounter (HOSPITAL_COMMUNITY)
Admission: RE | Admit: 2015-08-17 | Discharge: 2015-08-17 | Disposition: A | Discharge status: Home / Self Care | Payer: 59 | Source: Ambulatory Visit | Attending: Physician Assistant | Admitting: Physician Assistant

## 2015-08-17 DIAGNOSIS — E0590 Thyrotoxicosis, unspecified without thyrotoxic crisis or storm: Secondary | ICD-10-CM

## 2015-08-17 DIAGNOSIS — R634 Abnormal weight loss: Secondary | ICD-10-CM

## 2015-08-17 DIAGNOSIS — R112 Nausea with vomiting, unspecified: Secondary | ICD-10-CM | POA: Insufficient documentation

## 2015-08-17 DIAGNOSIS — R5383 Other fatigue: Secondary | ICD-10-CM

## 2015-08-17 MED ORDER — SODIUM IODIDE I 131 CAPSULE
10.7000 | Freq: Once | INTRAVENOUS | Status: AC | PRN
  Administered 2015-08-17: 10.7 via ORAL

## 2015-08-18 ENCOUNTER — Telehealth: Payer: Self-pay | Admitting: Physician Assistant

## 2015-08-18 ENCOUNTER — Encounter: Payer: Self-pay | Admitting: Physician Assistant

## 2015-08-18 ENCOUNTER — Encounter (HOSPITAL_COMMUNITY)
Admission: RE | Admit: 2015-08-18 | Discharge: 2015-08-18 | Disposition: A | Discharge status: Home / Self Care | Payer: 59 | Source: Ambulatory Visit | Attending: Physician Assistant | Admitting: Physician Assistant

## 2015-08-18 ENCOUNTER — Other Ambulatory Visit: Payer: Self-pay | Admitting: Physician Assistant

## 2015-08-18 DIAGNOSIS — R634 Abnormal weight loss: Secondary | ICD-10-CM | POA: Diagnosis not present

## 2015-08-18 DIAGNOSIS — R5383 Other fatigue: Secondary | ICD-10-CM | POA: Diagnosis not present

## 2015-08-18 DIAGNOSIS — R7989 Other specified abnormal findings of blood chemistry: Secondary | ICD-10-CM

## 2015-08-18 DIAGNOSIS — R945 Abnormal results of liver function studies: Secondary | ICD-10-CM

## 2015-08-18 DIAGNOSIS — E0500 Thyrotoxicosis with diffuse goiter without thyrotoxic crisis or storm: Secondary | ICD-10-CM

## 2015-08-18 DIAGNOSIS — E0590 Thyrotoxicosis, unspecified without thyrotoxic crisis or storm: Secondary | ICD-10-CM

## 2015-08-18 DIAGNOSIS — R112 Nausea with vomiting, unspecified: Secondary | ICD-10-CM | POA: Diagnosis not present

## 2015-08-18 HISTORY — DX: Thyrotoxicosis with diffuse goiter without thyrotoxic crisis or storm: E05.00

## 2015-08-18 MED ORDER — SODIUM PERTECHNETATE TC 99M INJECTION
10.0000 | Freq: Once | INTRAVENOUS | Status: AC | PRN
  Administered 2015-08-18: 10 via INTRAVENOUS

## 2015-08-18 MED ORDER — ATENOLOL 50 MG PO TABS: ORAL_TABLET | ORAL | Status: DC

## 2015-08-18 MED ORDER — PREDNISONE 20 MG PO TABS: ORAL_TABLET | ORAL | Status: DC

## 2015-08-18 NOTE — Telephone Encounter (Signed)
+   Graves disease on RAI uptake, will set up radioactive iodine treatment. Discussed with mother. Will send in prednisone and atenolol for symptoms at this time, will need TSH every month after treatment for 6 months, will need follow up on LFTs too.   Labs in epic.

## 2015-08-19 ENCOUNTER — Encounter: Payer: Self-pay | Admitting: Physician Assistant

## 2015-08-20 MED ORDER — PROMETHAZINE-CODEINE 6.25-10 MG/5ML PO SYRP: ORAL_SOLUTION | ORAL | Status: DC

## 2015-08-26 ENCOUNTER — Encounter (HOSPITAL_COMMUNITY)
Admission: RE | Admit: 2015-08-26 | Discharge: 2015-08-26 | Disposition: A | Discharge status: Home / Self Care | Payer: 59 | Source: Ambulatory Visit | Attending: Physician Assistant | Admitting: Physician Assistant

## 2015-08-26 ENCOUNTER — Encounter: Payer: Self-pay | Admitting: Physician Assistant

## 2015-08-26 DIAGNOSIS — R634 Abnormal weight loss: Secondary | ICD-10-CM | POA: Insufficient documentation

## 2015-08-26 DIAGNOSIS — E0590 Thyrotoxicosis, unspecified without thyrotoxic crisis or storm: Secondary | ICD-10-CM | POA: Insufficient documentation

## 2015-08-26 DIAGNOSIS — E0500 Thyrotoxicosis with diffuse goiter without thyrotoxic crisis or storm: Secondary | ICD-10-CM

## 2015-08-26 MED ORDER — SODIUM IODIDE I 131 CAPSULE
9.9100 | Freq: Once | INTRAVENOUS | Status: AC | PRN
  Administered 2015-08-26: 9.91 via ORAL

## 2015-08-29 ENCOUNTER — Encounter: Payer: Self-pay | Admitting: Physician Assistant

## 2015-09-01 ENCOUNTER — Encounter: Payer: Self-pay | Admitting: Physician Assistant

## 2015-09-13 ENCOUNTER — Other Ambulatory Visit: Payer: Self-pay | Admitting: Physician Assistant

## 2015-09-13 MED ORDER — PREDNISONE 20 MG PO TABS: ORAL_TABLET | ORAL | Status: DC

## 2015-09-14 ENCOUNTER — Encounter: Payer: Self-pay | Admitting: Physician Assistant

## 2015-09-16 ENCOUNTER — Ambulatory Visit: Payer: Self-pay | Admitting: Physician Assistant

## 2015-09-23 ENCOUNTER — Ambulatory Visit: Payer: Self-pay | Admitting: Physician Assistant

## 2015-09-23 ENCOUNTER — Encounter: Payer: Self-pay | Admitting: Physician Assistant

## 2015-09-23 ENCOUNTER — Ambulatory Visit (INDEPENDENT_AMBULATORY_CARE_PROVIDER_SITE_OTHER): Payer: 59 | Admitting: Physician Assistant

## 2015-09-23 ENCOUNTER — Other Ambulatory Visit: Payer: Self-pay

## 2015-09-23 VITALS — BP 140/80 | HR 109 | Temp 98.1°F | Resp 16 | Ht 75.0 in | Wt 191.0 lb

## 2015-09-23 DIAGNOSIS — Z79899 Other long term (current) drug therapy: Secondary | ICD-10-CM

## 2015-09-23 DIAGNOSIS — E0500 Thyrotoxicosis with diffuse goiter without thyrotoxic crisis or storm: Secondary | ICD-10-CM

## 2015-09-23 DIAGNOSIS — R7989 Other specified abnormal findings of blood chemistry: Secondary | ICD-10-CM | POA: Diagnosis not present

## 2015-09-23 DIAGNOSIS — R945 Abnormal results of liver function studies: Secondary | ICD-10-CM

## 2015-09-23 LAB — CBC WITH DIFFERENTIAL/PLATELET
BASOS ABS: 0 10*3/uL (ref 0.0–0.1)
BASOS PCT: 0 % (ref 0–1)
EOS ABS: 0.1 10*3/uL (ref 0.0–0.7)
Eosinophils Relative: 1 % (ref 0–5)
HCT: 43.8 % (ref 39.0–52.0)
Hemoglobin: 14.4 g/dL (ref 13.0–17.0)
Lymphocytes Relative: 15 % (ref 12–46)
Lymphs Abs: 0.9 10*3/uL (ref 0.7–4.0)
MCH: 27.2 pg (ref 26.0–34.0)
MCHC: 32.9 g/dL (ref 30.0–36.0)
MCV: 82.8 fL (ref 78.0–100.0)
MPV: 10.1 fL (ref 8.6–12.4)
Monocytes Absolute: 0.7 10*3/uL (ref 0.1–1.0)
Monocytes Relative: 12 % (ref 3–12)
NEUTROS PCT: 72 % (ref 43–77)
Neutro Abs: 4.5 10*3/uL (ref 1.7–7.7)
PLATELETS: 150 10*3/uL (ref 150–400)
RBC: 5.29 MIL/uL (ref 4.22–5.81)
RDW: 15.9 % — AB (ref 11.5–15.5)
WBC: 6.2 10*3/uL (ref 4.0–10.5)

## 2015-09-23 LAB — TSH

## 2015-09-23 MED ORDER — TRAZODONE HCL 50 MG PO TABS: ORAL_TABLET | ORAL | Status: DC

## 2015-09-23 MED ORDER — ATENOLOL 100 MG PO TABS: 100.0000 mg | ORAL_TABLET | Freq: Every day | ORAL | Status: DC

## 2015-09-23 MED ORDER — PREDNISONE 20 MG PO TABS: ORAL_TABLET | ORAL | Status: AC

## 2015-09-23 NOTE — Patient Instructions (Signed)
As your thyroid levels get better your symptoms will improve  You may need to cut the atenolol in half if you have any dizziness or your heart rate starts to decrease.   I'm going to refill the prednisone for you, take it with food  This can cause heart burn so you can take tums or zantac 300mg  at night.   I'm going to give you trazodone for sleep, try 1/2 pill at first, then you can go to a whole pill and can take up to 2 pills at night.   Hyperthyroidism Hyperthyroidism is when the thyroid is too active (overactive). Your thyroid is a large gland that is located in your neck. The thyroid helps to control how your body uses food (metabolism). When your thyroid is overactive, it produces too much of a hormone called thyroxine.  CAUSES Causes of hyperthyroidism may include:  Graves disease. This is when your immune system attacks the thyroid gland. This is the most common cause.  Inflammation of the thyroid gland.  Tumor in the thyroid gland or somewhere else.  Excessive use of thyroid medicines, including:  Prescription thyroid supplement.  Herbal supplements that mimic thyroid hormones.  Solid or fluid-filled lumps within your thyroid gland (thyroid nodules).  Excessive ingestion of iodine. RISK FACTORS  Being male.  Having a family history of thyroid conditions. SIGNS AND SYMPTOMS Signs and symptoms of hyperthyroidism may include:  Nervousness.  Inability to tolerate heat.  Unexplained weight loss.  Diarrhea.  Change in the texture of hair or skin.  Heart skipping beats or making extra beats.  Rapid heart rate.  Loss of menstruation.  Shaky hands.  Fatigue.  Restlessness.  Increased appetite.  Sleep problems.  Enlarged thyroid gland or nodules. DIAGNOSIS  Diagnosis of hyperthyroidism may include:  Medical history and physical exam.  Blood tests.  Ultrasound tests. TREATMENT Treatment may include:  Medicines to control your  thyroid.  Surgery to remove your thyroid.  Radiation therapy. HOME CARE INSTRUCTIONS   Take medicines only as directed by your health care provider.  Do not use any tobacco products, including cigarettes, chewing tobacco, or electronic cigarettes. If you need help quitting, ask your health care provider.  Do not exercise or do physical activity until your health care provider approves.  Keep all follow-up appointments as directed by your health care provider. This is important. SEEK MEDICAL CARE IF:  Your symptoms do not get better with treatment.  You have fever.  You are taking thyroid replacement medicine and you:  Have depression.  Feel mentally and physically slow.  Have weight gain. SEEK IMMEDIATE MEDICAL CARE IF:   You have decreased alertness or a change in your awareness.  You have abdominal pain.  You feel dizzy.  You have a rapid heartbeat.  You have an irregular heartbeat.   This information is not intended to replace advice given to you by your health care provider. Make sure you discuss any questions you have with your health care provider.   Document Released: 10/02/2005 Document Revised: 10/23/2014 Document Reviewed: 02/17/2014 Elsevier Interactive Patient Education [employer].

## 2015-09-23 NOTE — Progress Notes (Signed)
Subjective:    Patient ID: Kyle Gonzales, male    DOB: 1990-10-23, 23 y.o.   MRN: [id number]  HPI 24 y.o. AAM with diagnosed recently with Graves disease s/p RAI therapy on 08/26/2015 presents for follow up.   He states he is having 3 BMS a day but no diarrhea, he has trouble sleeping and will stay up until 4 AM and gets up at 11AM. He was having tremors but currently is doing better. He was having palpitations and tremors but this has improved, he is still on atenolol 50mg  at night and prednisone has 4 days left.  He also had abnormal liver function last visit, denies alcohol use, had normal [place] AB.   Lab Results  Component Value Date   TSH <0.008* 08/11/2015   Lab Results  Component Value Date   ALT 225* 08/11/2015   AST 105* 08/11/2015   ALKPHOS 112 08/11/2015   BILITOT 1.7* 08/11/2015    Wt Readings from Last 3 Encounters:  09/23/15 191 lb (86.637 kg)  08/11/15 189 lb (85.73 kg)  11/24/14 227 lb (102.967 kg)    Blood pressure 140/80, pulse 109, temperature 98.1 F (36.7 C), temperature source Temporal, resp. rate 16, height 6\' 3"  (1.905 m), weight 191 lb (86.637 kg), SpO2 99 %.   Current Outpatient Prescriptions on File Prior to Visit  Medication Sig Dispense Refill  . atenolol (TENORMIN) 50 MG tablet Start 1/2 pill at night for fast heart rates, can increase to 1 pill at night. 30 tablet 2  . ondansetron (ZOFRAN) 4 MG tablet Take 1 tablet (4 mg total) by mouth every 8 (eight) hours as needed for nausea or vomiting. 60 tablet 0  . predniSONE (DELTASONE) 20 MG tablet 1 pill 3 x a day for 3 days, 1 pill 2 x a day x 3 days, 1 pill a day x 5 days with food 20 tablet 0  . promethazine-codeine (PHENERGAN WITH CODEINE) 6.25-10 MG/5ML syrup 5-11ml at bed time for sore throat and sleep Max: 71mL per day 240 mL 0   No current facility-administered medications on file prior to visit.    Past Medical History  Diagnosis Date  . Graves disease 08/18/2015    Review of Systems   Constitutional: Positive for fatigue and unexpected weight change. Negative for fever, chills, diaphoresis, activity change and appetite change.  HENT: Negative.  Negative for trouble swallowing.   Respiratory: Positive for shortness of breath (with exertion). Negative for apnea, cough, choking, chest tightness, wheezing and stridor.   Cardiovascular: Positive for palpitations. Negative for chest pain and leg swelling.  Gastrointestinal: Negative for nausea, vomiting, abdominal pain, diarrhea, constipation, blood in stool, abdominal distention, anal bleeding and rectal pain.  Endocrine: Negative.  Negative for polydipsia, polyphagia and polyuria.  Genitourinary: Negative.   Musculoskeletal: Negative.  Negative for myalgias and arthralgias.  Skin: Negative.  Negative for rash.  Neurological: Negative.  Negative for dizziness.  Hematological: Negative.   Psychiatric/Behavioral: Positive for sleep disturbance. Negative for suicidal ideas, hallucinations, behavioral problems, confusion, self-injury, dysphoric mood, decreased concentration and agitation. The patient is not nervous/anxious and is not hyperactive.        Objective:   Physical Exam  Constitutional: He appears well-developed. No distress.  HENT:  Head: Normocephalic and atraumatic.  Mouth/Throat: No oropharyngeal exudate.  Eyes: Conjunctivae and EOM are normal. Pupils are equal, round, and reactive to light.  Neck: Normal range of motion. Neck supple. Thyromegaly (nontender but full thyroid) present.  Cardiovascular: Regular rhythm, normal heart  sounds and normal pulses.   No extrasystoles are present. Tachycardia present.  PMI is not displaced.   Pulmonary/Chest: Breath sounds normal. No respiratory distress.  Abdominal: Soft. Normal appearance and bowel sounds are normal. He exhibits no distension and no mass. There is no tenderness. There is no rigidity, no rebound, no guarding and no tenderness at McBurney's point.   Lymphadenopathy:    He has no cervical adenopathy.       Assessment & Plan:  1. Abnormal LFTs Recheck, had normal US - Hepatic function panel  2. Graves disease S/p RAI therapy, will check TSH once a month until normal Discussed with patient likely will need thyroid replacment therapy in the future Will continue to treat symptoms of graves- atenolol, trazodone, and prednisone sent in - TSH  3. Medication management - CBC with Differential/Platelet - BASIC METABOLIC PANEL WITH GFR

## 2015-09-24 ENCOUNTER — Encounter: Payer: Self-pay | Admitting: Physician Assistant

## 2015-09-24 LAB — HEPATIC FUNCTION PANEL
ALT: 347 U/L — ABNORMAL HIGH (ref 9–46)
AST: 106 U/L — AB (ref 10–40)
Albumin: 4.2 g/dL (ref 3.6–5.1)
Alkaline Phosphatase: 126 U/L — ABNORMAL HIGH (ref 40–115)
BILIRUBIN DIRECT: 0.4 mg/dL — AB (ref <=0.2)
BILIRUBIN TOTAL: 1.2 mg/dL (ref 0.2–1.2)
Indirect Bilirubin: 0.8 mg/dL (ref 0.2–1.2)
Total Protein: 7.5 g/dL (ref 6.1–8.1)

## 2015-09-24 LAB — BASIC METABOLIC PANEL WITH GFR
BUN: 9 mg/dL (ref 7–25)
CHLORIDE: 100 mmol/L (ref 98–110)
CO2: 29 mmol/L (ref 20–31)
CREATININE: 0.72 mg/dL (ref 0.60–1.35)
Calcium: 9.8 mg/dL (ref 8.6–10.3)
GFR, Est Non African American: >89 mL/min (ref >=60)
Glucose, Bld: 85 mg/dL (ref 65–99)
POTASSIUM: 4 mmol/L (ref 3.5–5.3)
SODIUM: 140 mmol/L (ref 135–146)

## 2015-09-24 NOTE — Addendum Note (Signed)
Addended by: Vicie Mutters R on: 09/24/2015 10:46 AM   Modules accepted: Orders

## 2015-10-25 ENCOUNTER — Other Ambulatory Visit: Payer: Self-pay

## 2015-10-28 ENCOUNTER — Other Ambulatory Visit: Payer: Self-pay

## 2015-11-04 ENCOUNTER — Other Ambulatory Visit: Payer: Self-pay

## 2015-11-10 ENCOUNTER — Other Ambulatory Visit: Payer: Self-pay | Admitting: Physician Assistant

## 2015-11-10 ENCOUNTER — Other Ambulatory Visit: Payer: 59

## 2015-11-10 DIAGNOSIS — E0500 Thyrotoxicosis with diffuse goiter without thyrotoxic crisis or storm: Secondary | ICD-10-CM

## 2015-11-10 DIAGNOSIS — R7989 Other specified abnormal findings of blood chemistry: Secondary | ICD-10-CM

## 2015-11-10 DIAGNOSIS — R945 Abnormal results of liver function studies: Principal | ICD-10-CM

## 2015-11-10 DIAGNOSIS — E0590 Thyrotoxicosis, unspecified without thyrotoxic crisis or storm: Secondary | ICD-10-CM

## 2015-11-11 LAB — HEPATIC FUNCTION PANEL
ALK PHOS: 93 U/L (ref 40–115)
ALT: 111 U/L — ABNORMAL HIGH (ref 9–46)
AST: 50 U/L — ABNORMAL HIGH (ref 10–40)
Albumin: 4.2 g/dL (ref 3.6–5.1)
BILIRUBIN DIRECT: 0.1 mg/dL (ref <=0.2)
BILIRUBIN INDIRECT: 0.4 mg/dL (ref 0.2–1.2)
TOTAL PROTEIN: 6.8 g/dL (ref 6.1–8.1)
Total Bilirubin: 0.5 mg/dL (ref 0.2–1.2)

## 2015-11-11 LAB — ANTI-NUCLEAR AB-TITER (ANA TITER)

## 2015-11-11 LAB — ANA: ANA: POSITIVE — AB

## 2015-11-11 LAB — FERRITIN: Ferritin: 221 ng/mL (ref 22–322)

## 2015-11-11 LAB — GAMMA GT: GGT: 891 U/L — ABNORMAL HIGH (ref 7–51)

## 2015-11-11 LAB — TSH: TSH: 0.056 u[IU]/mL — AB (ref 0.350–4.500)

## 2015-11-11 LAB — ANTI-DNA ANTIBODY, DOUBLE-STRANDED

## 2015-11-11 LAB — IRON: IRON: 58 ug/dL (ref 50–195)

## 2015-11-12 LAB — MITOCHONDRIAL/SMOOTH MUSCLE AB PNL
Mitochondrial M2 Ab, IgG: <=20 Units (ref <=20.0)
Smooth Muscle Ab: <20 U (ref <20)

## 2015-11-13 LAB — COPPER, SERUM: COPPER: 101 ug/dL (ref 70–175)

## 2015-11-30 ENCOUNTER — Encounter: Payer: Self-pay | Admitting: Physician Assistant

## 2015-12-04 DIAGNOSIS — R002 Palpitations: Secondary | ICD-10-CM | POA: Insufficient documentation

## 2015-12-04 DIAGNOSIS — E039 Hypothyroidism, unspecified: Secondary | ICD-10-CM | POA: Insufficient documentation

## 2015-12-04 NOTE — Progress Notes (Signed)
Patient ID: Kyle Gonzales, male   DOB: 06/30/1991, 25 y.o.   MRN: [id number]  Annual  Screening/Preventative Visit And Comprehensive Evaluation & Examination     This very nice 25 y.o. SBM presents for a Wellness/Preventative Visit & comprehensive evaluation and management of multiple medical co-morbidities.  Patient has been followed for palpitations and this past year was dx'd with Graves' Dz in Nov 2016 and treated with RAI 131 and is still in observation mode. Patient also has hx/o Vitamin D Deficiency.     Today's BP: 126/88 mmHg. Patient denies any cardiac symptoms as chest pain, palpitations, shortness of breath, dizziness or ankle swelling.     Patient's  hyperlipidemia is controlled with diet & he has hx/o borderline high normal LDL of 100 in Feb 2016. Last lipids were as below: Lab Results  Component Value Date   CHOL 176 11/24/2014   HDL 62 11/24/2014   LDLCALC 100* 11/24/2014   TRIG 68 11/24/2014   CHOLHDL 2.8 11/24/2014      Patient is overweight  (BMI 28) and is screened expectantly for prediabetes and patient denies reactive hypoglycemic symptoms, visual blurring, diabetic polys or paresthesias. Last A1c was  5.3% on 08/11/2015.       Finally, patient has history of Vitamin D Deficiency of "7" in Feb 2016.   Medication Sig  . atenolol  100 MG  Take 1 tablet (100 mg total) by mouth at bedtime.  Marland Kitchen FLONASE nasal spray 2 sprays by Each Nare route daily.  . ondansetron (ZOFRAN) 4 MG  Take 1 tablet (4 mg total) by mouth every 8 (eight) hours as needed for nausea or vomiting.  Marland Kitchen PSEUDOEPHEDRINE 120 MG 12 hr  Take 120 mg by mouth.  . traZODone (DESYREL) 50 MG  1/2-2 pills at bed time for sleep   No Known Allergies   Past Medical History  Diagnosis Date  . Graves disease 08/18/2015   Health Maintenance  Topic Date Due  . Samul Dada  09/26/2011  . INFLUENZA VACCINE  09/21/2016 (Originally 05/17/2015)  . HIV Screening  Completed   There is no immunization history on file for  this patient.  Past Surgical History  Procedure Laterality Date  . Anterior cruciate ligament repair  2012   History reviewed. No pertinent family history.  Social History   Social History  . Marital Status: Single    Spouse Name: N/A  . Number of Children: N/A  . Years of Education: N/A   Occupational History  .    Social History Main Topics  . Smoking status: Never Smoker   . Smokeless tobacco: Never Used  . Alcohol Use: No  . Drug Use: No  . Sexual Activity:     ROS Constitutional: Denies fever, chills, weight loss/gain, headaches, insomnia,  night sweats or change in appetite. Does c/o fatigue. Eyes: Denies redness, blurred vision, diplopia, discharge, itchy or watery eyes.  ENT: Denies discharge, congestion, post nasal drip, epistaxis, sore throat, earache, hearing loss, dental pain, Tinnitus, Vertigo, Sinus pain or snoring.  Cardio: Denies chest pain, palpitations, irregular heartbeat, syncope, dyspnea, diaphoresis, orthopnea, PND, claudication or edema Respiratory: denies cough, dyspnea, DOE, pleurisy, hoarseness, laryngitis or wheezing.  Gastrointestinal: Denies dysphagia, heartburn, reflux, water brash, pain, cramps, nausea, vomiting, bloating, diarrhea, constipation, hematemesis, melena, hematochezia, jaundice or hemorrhoids Genitourinary: Denies dysuria, frequency, urgency, nocturia, hesitancy, discharge, hematuria or flank pain Musculoskeletal: Denies arthralgia, myalgia, stiffness, Jt. Swelling, pain, limp or strain/sprain. Denies Falls. Skin: Denies puritis, rash, hives, warts, acne, eczema or change  in skin lesion Neuro: No weakness, tremor, incoordination, spasms, paresthesia or pain Psychiatric: Denies confusion, memory loss or sensory loss. Denies Depression. Endocrine: Denies change in weight, skin, hair change, nocturia, and paresthesia, diabetic polys, visual blurring or hyper / hypo glycemic episodes.  Heme/Lymph: No excessive bleeding, bruising or  enlarged lymph nodes.  Physical Exam  BP 126/88 mmHg  Pulse 72  Temp(Src) 97.7 F (36.5 C)  Resp 16  Ht 6\' 3"  (1.905 m)  Wt 240 lb 12.8 oz (109.226 kg)  BMI 30.10 kg/m2  General Appearance: Well nourished, in no apparent distress. Eyes: PERRLA, EOMs, conjunctiva no swelling or erythema, normal fundi and vessels. Sinuses: No frontal/maxillary tenderness ENT/Mouth: EACs patent / TMs  nl. Nares clear without erythema, swelling, mucoid exudates. Oral hygiene is good. No erythema, swelling, or exudate. Tongue normal, non-obstructing. Tonsils not swollen or erythematous. Hearing normal.  Neck: Supple, thyroid normal. No bruits, nodes or JVD. Respiratory: Respiratory effort normal.  BS equal and clear bilateral without rales, rhonci, wheezing or stridor. Cardio: Heart sounds are normal with regular rate and rhythm and no murmurs, rubs or gallops. Peripheral pulses are normal and equal bilaterally without edema. No aortic or femoral bruits. Chest: symmetric with normal excursions and percussion.  Abdomen: Soft, with Nl bowel sounds. Nontender, no guarding, rebound, hernias, masses, or organomegaly.  Lymphatics: Non tender without lymphadenopathy.  Genitourinary: No hernias.Testes nl. DRE - prostate nl for age - smooth & firm w/o nodules. Musculoskeletal: Full ROM all peripheral extremities, joint stability, 5/5 strength, and normal gait. Skin: Warm and dry without rashes, lesions, cyanosis, clubbing or  ecchymosis.  Neuro: Cranial nerves intact, reflexes equal bilaterally. Normal muscle tone, no cerebellar symptoms. Sensation intact.  Pysch: Alert and oriented X 3 with normal affect, insight and judgment appropriate.   Assessment and Plan  1. Annual Preventative/Screening Exam   - TSH  2. Palpitations  - EKG 12-Lead  3. Screening for cholesterol level  - Lipid panel  4. Screening for diabetes mellitus  - Hemoglobin A1c - Insulin, random  5. Vitamin D deficiency  - VITAMIN D  25 Hydroxy   6. 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 Screening for rectal cancer  - POC Hemoccult Bld/Stl   8. Other fatigue  - Testosterone  9. Medication management  - Urinalysis, Routine w reflex microscopic - CBC with Differential/Platelet - BASIC METABOLIC PANEL WITH GFR - Hepatic function panel - Magnesium  10. Screening examination for pulmonary tuberculosis  - PPD  11. Elevated liver function tests  - Hepatic function panel  12. Hyperthyroidism  - TSH     Continue prudent diet as discussed, weight control, BP monitoring, regular exercise, and medications as discussed.  Discussed med effects and SE's. Routine screening labs and tests as requested with regular follow-up as recommended. Over 40 minutes of exam, counseling, chart review and high complex critical decision making was performed

## 2015-12-04 NOTE — Patient Instructions (Addendum)
Hypothyroidism Hypothyroidism is a disorder of the thyroid. The thyroid is a large gland that is located in the lower front of the neck. The thyroid releases hormones that control how the body works. With hypothyroidism, the thyroid does not make enough of these hormones. CAUSES Causes of hypothyroidism may include:  Viral infections.  Pregnancy.  Your own defense system (immune system) attacking your thyroid.  Certain medicines.  Birth defects.  Past radiation treatments to your head or neck.  Past treatment with radioactive iodine.  Past surgical removal of part or all of your thyroid.  Problems with the gland that is located in the center of your brain (pituitary). SIGNS AND SYMPTOMS Signs and symptoms of hypothyroidism may include:  Feeling as though you have no energy (lethargy).  Inability to tolerate cold.  Weight gain that is not explained by a change in diet or exercise habits.  Dry skin.  Coarse hair.  Menstrual irregularity.  Slowing of thought processes.  Constipation.  Sadness or depression. DIAGNOSIS  Your health care provider may diagnose hypothyroidism with blood tests and ultrasound tests. TREATMENT Hypothyroidism is treated with medicine that replaces the hormones that your body does not make. After you begin treatment, it may take several weeks for symptoms to go away. HOME CARE INSTRUCTIONS   Take medicines only as directed by your health care provider.  If you start taking any new medicines, tell your health care provider.  Keep all follow-up visits as directed by your health care provider. This is important. As your condition improves, your dosage needs may change. You will need to have blood tests regularly so that your health care provider can watch your condition. SEEK MEDICAL CARE IF:  Your symptoms do not get better with treatment.  You are taking thyroid replacement medicine and:  You sweat excessively.  You have tremors.  You  feel anxious.  You lose weight rapidly.  You cannot tolerate heat.  You have emotional swings.  You have diarrhea.  You feel weak. SEEK IMMEDIATE MEDICAL CARE IF:   You develop chest pain.  You develop an irregular heartbeat.  You develop a rapid heartbeat.   This information is not intended to replace advice given to you by your health care provider. Make sure you discuss any questions you have with your health care provider.   Document Released: 10/02/2005 Document Revised: 10/23/2014 Document Reviewed: 02/17/2014 Elsevier Interactive Patient Education 2016 [employer]  ++++++++++++++++++++++++++++++++++++++++++++++++++++++++ Recommend Adult Low Dose Aspirin or   coated  Aspirin 81 mg daily   To reduce risk of Colon Cancer 20 %,   Skin Cancer 26 % ,   Melanoma 46%   and   Pancreatic cancer 60%   ++++++++++++++++++++++++++++++++++++++++++++++++++++++ Vitamin D goal   is between 70-100.   Please make sure that you are taking your Vitamin D as directed.   It is very important as a natural anti-inflammatory   helping hair, skin, and nails, as well as reducing stroke and heart attack risk.   It helps your bones and helps with mood.  It also decreases numerous cancer risks so please take it as directed.   Low Vit D is associated with a 200-300% higher risk for CANCER   and 200-300% higher risk for HEART   ATTACK  &  STROKE.   .....................................Marland Kitchen  It is also associated with higher death rate at younger ages,   autoimmune diseases like Rheumatoid arthritis, Lupus, Multiple Sclerosis.     Also many other serious conditions, like depression,  Alzheimer's  Dementia, infertility, muscle aches, fatigue, fibromyalgia - just to name a few.  ++++++++++++++++++++++++++++++++++++++++++++++++  Recommend the book "The END of DIETING" by Dr Excell Seltzer   & the book "The END of DIABETES " by Dr Excell Seltzer  At [email or web address] - get book & Audio  CD's     Being diabetic has a  300% increased risk for heart attack, stroke, cancer, and alzheimer- type vascular dementia. It is very important that you work harder with diet by avoiding all foods that are white. Avoid white rice (brown & wild rice is OK), white potatoes (sweetpotatoes in moderation is OK), White bread or wheat bread or anything made out of white flour like bagels, donuts, rolls, buns, biscuits, cakes, pastries, cookies, pizza crust, and pasta (made from white flour & egg whites) - vegetarian pasta or spinach or wheat pasta is OK. Multigrain breads like Arnold's or Pepperidge [employer], or multigrain sandwich thins or flatbreads.  Diet, exercise and weight loss can reverse and cure diabetes in the early stages.  Diet, exercise and weight loss is very important in the control and prevention of complications of diabetes which affects every system in your body, ie. Brain - dementia/stroke, eyes - glaucoma/blindness, heart - heart attack/heart failure, kidneys - dialysis, stomach - gastric paralysis, intestines - malabsorption, nerves - severe painful neuritis, circulation - gangrene & loss of a leg(s), and finally cancer and Alzheimers.    I recommend avoid fried & greasy foods,  sweets/candy, white rice (brown or wild rice or Quinoa is OK), white potatoes (sweet potatoes are OK) - anything made from white flour - bagels, doughnuts, rolls, buns, biscuits,white and wheat breads, pizza crust and traditional pasta made of white flour & egg white(vegetarian pasta or spinach or wheat pasta is OK).  Multi-grain bread is OK - like multi-grain flat bread or sandwich thins. Avoid alcohol in excess. Exercise is also important.    Eat all the vegetables you want - avoid meat, especially red meat and dairy - especially cheese.  Cheese is the most concentrated form of trans-fats which is the worst thing to clog up our arteries. Veggie cheese is OK which can be found in the fresh produce section at [employer]  or [employer] or [employer]  ++++++++++++++++++++++++++++++++++++++++++++++++++ DASH Eating Plan  DASH stands for "Dietary Approaches to Stop Hypertension."   The DASH eating plan is a healthy eating plan that has been shown to reduce high blood pressure (hypertension). Additional health benefits may include reducing the risk of type 2 diabetes mellitus, heart disease, and stroke. The DASH eating plan may also help with weight loss.  WHAT DO I NEED TO KNOW ABOUT THE DASH EATING PLAN?  For the DASH eating plan, you will follow these general guidelines:  Choose foods with a percent daily value for sodium of less than 5% (as listed on the food label).  Use salt-free seasonings or herbs instead of table salt or sea salt.  Check with your health care provider or pharmacist before using salt substitutes.  Eat lower-sodium products, often labeled as "lower sodium" or "no salt added."  Eat fresh foods.  Eat more vegetables, fruits, and low-fat dairy products.    Choose whole grains. Look for the word "whole" as the first word in the ingredient list.  Choose fish   Limit sweets, desserts, sugars, and sugary drinks.  Choose heart-healthy fats.  Eat veggie cheese   Eat more home-cooked food and less restaurant, buffet, and fast food.  Limit fried foods.  Cook foods using methods other than frying.  Limit canned vegetables. If you do use them, rinse them well to decrease the sodium.  When eating at a restaurant, ask that your food be prepared with less salt, or no salt if possible.                      WHAT FOODS CAN I EAT?  Read Dr Fara Olden Fuhrman's books on The End of Dieting & The End of Diabetes  Grains  Whole grain or whole wheat bread. Brown rice. Whole grain or whole wheat pasta. Quinoa, bulgur, and whole grain cereals. Low-sodium cereals. Corn or whole wheat flour tortillas. Whole grain cornbread. Whole grain crackers. Low-sodium crackers.  Vegetables  Fresh or  frozen vegetables (raw, steamed, roasted, or grilled). Low-sodium or reduced-sodium tomato and vegetable juices. Low-sodium or reduced-sodium tomato sauce and paste. Low-sodium or reduced-sodium canned vegetables.   Fruits  All fresh, canned (in natural juice), or frozen fruits.  Protein Products   All fish and seafood.  Dried beans, peas, or lentils. Unsalted nuts and seeds. Unsalted canned beans.  Dairy  Low-fat dairy products, such as skim or 1% milk, 2% or reduced-fat cheeses, low-fat ricotta or cottage cheese, or plain low-fat yogurt. Low-sodium or reduced-sodium cheeses.  Fats and Oils  Tub margarines without trans fats. Light or reduced-fat mayonnaise and salad dressings (reduced sodium). Avocado. Safflower, olive, or canola oils. Natural peanut or almond butter.  Other  Unsalted popcorn and pretzels. The items listed above may not be a complete list of recommended foods or beverages. Contact your dietitian for more options.  +++++++++++++++++++++++++++++++++++++++++++  WHAT FOODS ARE NOT RECOMMENDED?  Grains/ White flour or wheat flour  White bread. White pasta. White rice. Refined cornbread. Bagels and croissants. Crackers that contain trans fat.  Vegetables  Creamed or fried vegetables. Vegetables in a . Regular canned vegetables. Regular canned tomato sauce and paste. Regular tomato and vegetable juices.  Fruits  Dried fruits. Canned fruit in light or heavy syrup. Fruit juice.  Meat and Other Protein Products  Meat in general - RED mwaet & White meat.  Fatty cuts of meat. Ribs, chicken wings, bacon, sausage, bologna, salami, chitterlings, fatback, hot dogs, bratwurst, and packaged luncheon meats.  Dairy  Whole or 2% milk, cream, half-and-half, and cream cheese. Whole-fat or sweetened yogurt. Full-fat cheeses or blue cheese. Nondairy creamers and whipped toppings. Processed cheese, cheese spreads, or cheese curds.  Condiments  Onion and garlic salt, seasoned  salt, table salt, and sea salt. Canned and packaged gravies. Worcestershire sauce. Tartar sauce. Barbecue sauce. Teriyaki sauce. Soy sauce, including reduced sodium. Steak sauce. Fish sauce. Oyster sauce. Cocktail sauce. Horseradish. Ketchup and mustard. Meat flavorings and tenderizers. Bouillon cubes. Hot sauce. Tabasco sauce. Marinades. Taco seasonings. Relishes.  Fats and Oils Butter, stick margarine, lard, shortening and bacon fat. Coconut, palm kernel, or palm oils. Regular salad dressings.  Pickles and olives. Salted popcorn and pretzels.  The items listed above may not be a complete list of foods and beverages to avoid.

## 2015-12-06 ENCOUNTER — Encounter: Payer: Self-pay | Admitting: Internal Medicine

## 2015-12-06 ENCOUNTER — Ambulatory Visit (INDEPENDENT_AMBULATORY_CARE_PROVIDER_SITE_OTHER): Payer: 59 | Admitting: Internal Medicine

## 2015-12-06 VITALS — BP 126/88 | HR 72 | Temp 97.7°F | Resp 16 | Ht 75.0 in | Wt 240.8 lb

## 2015-12-06 DIAGNOSIS — E559 Vitamin D deficiency, unspecified: Secondary | ICD-10-CM

## 2015-12-06 DIAGNOSIS — Z111 Encounter for screening for respiratory tuberculosis: Secondary | ICD-10-CM | POA: Diagnosis not present

## 2015-12-06 DIAGNOSIS — Z131 Encounter for screening for diabetes mellitus: Secondary | ICD-10-CM

## 2015-12-06 DIAGNOSIS — R945 Abnormal results of liver function studies: Secondary | ICD-10-CM

## 2015-12-06 DIAGNOSIS — Z0001 Encounter for general adult medical examination with abnormal findings: Secondary | ICD-10-CM

## 2015-12-06 DIAGNOSIS — R002 Palpitations: Secondary | ICD-10-CM

## 2015-12-06 DIAGNOSIS — E0590 Thyrotoxicosis, unspecified without thyrotoxic crisis or storm: Secondary | ICD-10-CM

## 2015-12-06 DIAGNOSIS — Z79899 Other long term (current) drug therapy: Secondary | ICD-10-CM

## 2015-12-06 DIAGNOSIS — Z0000 Encounter for general adult medical examination without abnormal findings: Secondary | ICD-10-CM | POA: Diagnosis not present

## 2015-12-06 DIAGNOSIS — E039 Hypothyroidism, unspecified: Secondary | ICD-10-CM

## 2015-12-06 DIAGNOSIS — R7989 Other specified abnormal findings of blood chemistry: Secondary | ICD-10-CM

## 2015-12-06 DIAGNOSIS — Z13220 Encounter for screening for lipoid disorders: Secondary | ICD-10-CM

## 2015-12-06 DIAGNOSIS — R5383 Other fatigue: Secondary | ICD-10-CM

## 2015-12-06 DIAGNOSIS — Z1212 Encounter for screening for malignant neoplasm of rectum: Secondary | ICD-10-CM

## 2015-12-06 LAB — CBC WITH DIFFERENTIAL/PLATELET
Basophils Absolute: 0 10*3/uL (ref 0.0–0.1)
Basophils Relative: 0 % (ref 0–1)
Eosinophils Absolute: 0.1 10*3/uL (ref 0.0–0.7)
Eosinophils Relative: 2 % (ref 0–5)
HCT: 46.2 % (ref 39.0–52.0)
HEMOGLOBIN: 14.9 g/dL (ref 13.0–17.0)
LYMPHS ABS: 1.3 10*3/uL (ref 0.7–4.0)
Lymphocytes Relative: 25 % (ref 12–46)
MCH: 28.7 pg (ref 26.0–34.0)
MCHC: 32.3 g/dL (ref 30.0–36.0)
MCV: 89 fL (ref 78.0–100.0)
MONO ABS: 0.4 10*3/uL (ref 0.1–1.0)
MONOS PCT: 7 % (ref 3–12)
MPV: 10.6 fL (ref 8.6–12.4)
NEUTROS ABS: 3.4 10*3/uL (ref 1.7–7.7)
NEUTROS PCT: 66 % (ref 43–77)
Platelets: 146 10*3/uL — ABNORMAL LOW (ref 150–400)
RBC: 5.19 MIL/uL (ref 4.22–5.81)
RDW: 17.2 % — ABNORMAL HIGH (ref 11.5–15.5)
WBC: 5.1 10*3/uL (ref 4.0–10.5)

## 2015-12-06 LAB — HEMOGLOBIN A1C
Hgb A1c MFr Bld: 5.7 % — ABNORMAL HIGH (ref <5.7)
Mean Plasma Glucose: 117 mg/dL — ABNORMAL HIGH (ref <117)

## 2015-12-07 ENCOUNTER — Other Ambulatory Visit: Payer: Self-pay | Admitting: Internal Medicine

## 2015-12-07 DIAGNOSIS — E039 Hypothyroidism, unspecified: Secondary | ICD-10-CM

## 2015-12-07 LAB — BASIC METABOLIC PANEL WITH GFR
BUN: 12 mg/dL (ref 7–25)
CHLORIDE: 103 mmol/L (ref 98–110)
CO2: 26 mmol/L (ref 20–31)
CREATININE: 1.32 mg/dL (ref 0.60–1.35)
Calcium: 9.5 mg/dL (ref 8.6–10.3)
GFR, Est African American: 87 mL/min (ref >=60)
GFR, Est Non African American: 76 mL/min (ref >=60)
GLUCOSE: 90 mg/dL (ref 65–99)
Potassium: 4.6 mmol/L (ref 3.5–5.3)
Sodium: 139 mmol/L (ref 135–146)

## 2015-12-07 LAB — URINALYSIS, ROUTINE W REFLEX MICROSCOPIC
BILIRUBIN URINE: NEGATIVE
GLUCOSE, UA: NEGATIVE
Hgb urine dipstick: NEGATIVE
KETONES UR: NEGATIVE
Leukocytes, UA: NEGATIVE
Nitrite: NEGATIVE
PROTEIN: NEGATIVE
SPECIFIC GRAVITY, URINE: 1.023 (ref 1.001–1.035)
pH: 5.5 (ref 5.0–8.0)

## 2015-12-07 LAB — HEPATIC FUNCTION PANEL
ALBUMIN: 4.4 g/dL (ref 3.6–5.1)
ALT: 82 U/L — AB (ref 9–46)
AST: 60 U/L — AB (ref 10–40)
Alkaline Phosphatase: 104 U/L (ref 40–115)
Bilirubin, Direct: 0.1 mg/dL (ref <=0.2)
Indirect Bilirubin: 0.5 mg/dL (ref 0.2–1.2)
TOTAL PROTEIN: 7.3 g/dL (ref 6.1–8.1)
Total Bilirubin: 0.6 mg/dL (ref 0.2–1.2)

## 2015-12-07 LAB — INSULIN, RANDOM: INSULIN: 16.9 u[IU]/mL (ref 2.0–19.6)

## 2015-12-07 LAB — TESTOSTERONE: Testosterone: 440 ng/dL (ref 250–827)

## 2015-12-07 LAB — MAGNESIUM: Magnesium: 2.1 mg/dL (ref 1.5–2.5)

## 2015-12-07 LAB — LIPID PANEL
CHOLESTEROL: 352 mg/dL — AB (ref 125–200)
HDL: 119 mg/dL (ref >=40)
LDL CALC: 212 mg/dL — AB (ref <130)
TRIGLYCERIDES: 104 mg/dL (ref <150)
Total CHOL/HDL Ratio: 3 Ratio (ref <=5.0)
VLDL: 21 mg/dL (ref <30)

## 2015-12-07 LAB — VITAMIN D 25 HYDROXY (VIT D DEFICIENCY, FRACTURES): Vit D, 25-Hydroxy: 20 ng/mL — ABNORMAL LOW (ref 30–100)

## 2015-12-07 LAB — TSH: TSH: 93.83 mIU/L — ABNORMAL HIGH (ref 0.40–4.50)

## 2015-12-07 MED ORDER — LEVOTHYROXINE SODIUM 125 MCG PO TABS: ORAL_TABLET | ORAL | Status: DC

## 2015-12-08 LAB — TB SKIN TEST
Induration: 0 mm
TB Skin Test: NEGATIVE

## 2015-12-13 ENCOUNTER — Encounter: Payer: Self-pay | Admitting: Internal Medicine

## 2016-01-05 ENCOUNTER — Ambulatory Visit: Payer: Self-pay | Admitting: Internal Medicine

## 2016-01-11 ENCOUNTER — Encounter: Payer: Self-pay | Admitting: Internal Medicine

## 2016-01-11 ENCOUNTER — Ambulatory Visit (INDEPENDENT_AMBULATORY_CARE_PROVIDER_SITE_OTHER): Payer: 59 | Admitting: Internal Medicine

## 2016-01-11 VITALS — BP 128/76 | HR 56 | Temp 97.5°F | Resp 16 | Ht 75.0 in | Wt 242.8 lb

## 2016-01-11 DIAGNOSIS — Z79899 Other long term (current) drug therapy: Secondary | ICD-10-CM | POA: Diagnosis not present

## 2016-01-11 DIAGNOSIS — E039 Hypothyroidism, unspecified: Secondary | ICD-10-CM | POA: Diagnosis not present

## 2016-01-11 DIAGNOSIS — E785 Hyperlipidemia, unspecified: Secondary | ICD-10-CM | POA: Diagnosis not present

## 2016-01-11 DIAGNOSIS — E782 Mixed hyperlipidemia: Secondary | ICD-10-CM | POA: Insufficient documentation

## 2016-01-11 LAB — TSH: TSH: 46.25 mIU/L — ABNORMAL HIGH (ref 0.40–4.50)

## 2016-01-11 NOTE — Patient Instructions (Signed)
Hypothyroidism Hypothyroidism is a disorder of the thyroid. The thyroid is a large gland that is located in the lower front of the neck. The thyroid releases hormones that control how the body works. With hypothyroidism, the thyroid does not make enough of these hormones. CAUSES Causes of hypothyroidism may include:  Viral infections.  Pregnancy.  Your own defense system (immune system) attacking your thyroid.  Certain medicines.  Birth defects.  Past radiation treatments to your head or neck.  Past treatment with radioactive iodine.  Past surgical removal of part or all of your thyroid.  Problems with the gland that is located in the center of your brain (pituitary). SIGNS AND SYMPTOMS Signs and symptoms of hypothyroidism may include:  Feeling as though you have no energy (lethargy).  Inability to tolerate cold.  Weight gain that is not explained by a change in diet or exercise habits.  Dry skin.  Coarse hair.  Menstrual irregularity.  Slowing of thought processes.  Constipation.  Sadness or depression. DIAGNOSIS  Your health care provider may diagnose hypothyroidism with blood tests and ultrasound tests. TREATMENT Hypothyroidism is treated with medicine that replaces the hormones that your body does not make. After you begin treatment, it may take several weeks for symptoms to go away. HOME CARE INSTRUCTIONS   Take medicines only as directed by your health care provider.  If you start taking any new medicines, tell your health care provider.  Keep all follow-up visits as directed by your health care provider. This is important. As your condition improves, your dosage needs may change. You will need to have blood tests regularly so that your health care provider can watch your condition. SEEK MEDICAL CARE IF:  Your symptoms do not get better with treatment.  You are taking thyroid replacement medicine and:  You sweat excessively.  You have tremors.  You  feel anxious.  You lose weight rapidly.  You cannot tolerate heat.  You have emotional swings.  You have diarrhea.  You feel weak. SEEK IMMEDIATE MEDICAL CARE IF:   You develop chest pain.  You develop an irregular heartbeat.  You develop a rapid heartbeat.   This information is not intended to replace advice given to you by your health care provider. Make sure you discuss any questions you have with your health care provider.   Document Released: 10/02/2005 Document Revised: 10/23/2014 Document Reviewed: 02/17/2014 Elsevier Interactive Patient Education 2016 [employer].  High Cholesterol High cholesterol refers to having a high level of cholesterol in your blood. Cholesterol is a white, waxy, fat-like protein that your body needs in small amounts. Your liver makes all the cholesterol you need. Excess cholesterol comes from the food you eat. Cholesterol travels in your bloodstream through your blood vessels. If you have high cholesterol, deposits (plaque) may build up on the walls of your blood vessels. This makes the arteries narrower and stiffer. Plaque increases your risk of heart attack and stroke. Work with your health care provider to keep your cholesterol levels in a healthy range. RISK FACTORS Several things can make you more likely to have high cholesterol. These include:   Eating foods high in animal fat (saturated fat) or cholesterol.  Being overweight.  Not getting enough exercise.  Having a family history of high cholesterol. SIGNS AND SYMPTOMS High cholesterol does not cause symptoms. DIAGNOSIS  Your health care provider can do a blood test to check whether you have high cholesterol. If you are older than 20, your health care provider may  check your cholesterol every 4-6 years. You may be checked more often if you already have high cholesterol or other risk factors for heart disease. The blood test for cholesterol measures the following:  Bad cholesterol  (LDL cholesterol). This is the type of cholesterol that causes heart disease. This number should be less than 100.  Good cholesterol (HDL cholesterol). This type helps protect against heart disease. A healthy level of HDL cholesterol is 60 or higher.  Total cholesterol. This is the combined number of LDL cholesterol and HDL cholesterol. A healthy number is less than 200. TREATMENT  High cholesterol can be treated with diet changes, lifestyle changes, and medicine.   Diet changes may include eating more whole grains, fruits, vegetables, nuts, and fish. You may also have to cut back on red meat and foods with a lot of added sugar.  Lifestyle changes may include getting at least 40 minutes of aerobic exercise three times a week. Aerobic exercises include walking, biking, and swimming. Aerobic exercise along with a healthy diet can help you maintain a healthy weight. Lifestyle changes may also include quitting smoking.  If diet and lifestyle changes are not enough to lower your cholesterol, your health care provider may prescribe a statin medicine. This medicine has been shown to lower cholesterol and also lower the risk of heart disease. HOME CARE INSTRUCTIONS  Only take over-the-counter or prescription medicines as directed by your health care provider.   Follow a healthy diet as directed by your health care provider. For instance:   Eat chicken (without skin), fish, veal, shellfish, ground [place] breast, and round or loin cuts of red meat.  Do not eat fried foods and fatty meats, such as hot dogs and salami.   Eat plenty of fruits, such as apples.   Eat plenty of vegetables, such as broccoli, potatoes, and carrots.   Eat beans, peas, and lentils.   Eat grains, such as barley, rice, couscous, and bulgur wheat.   Eat pasta without cream sauces.   Use skim or nonfat milk and low-fat or nonfat yogurt and cheeses. Do not eat or drink whole milk, cream, ice cream, egg yolks, and  hard cheeses.   Do not eat stick margarine or tub margarines that contain trans fats (also called partially hydrogenated oils).   Do not eat cakes, cookies, crackers, or other baked goods that contain trans fats.   Do not eat saturated tropical oils, such as coconut and palm oil.   Exercise as directed by your health care provider. Increase your activity level with activities such as gardening or walking.   Keep all follow-up appointments.  SEEK MEDICAL CARE IF:  You are struggling to maintain a healthy diet or weight.  You need help starting an exercise program.  You need help to stop smoking. SEEK IMMEDIATE MEDICAL CARE IF:  You have chest pain.  You have trouble breathing.   This information is not intended to replace advice given to you by your health care provider. Make sure you discuss any questions you have with your health care provider.   Document Released: 10/02/2005 Document Revised: 10/23/2014 Document Reviewed: 07/25/2013 Elsevier Interactive Patient Education [employer].

## 2016-01-11 NOTE — Progress Notes (Signed)
   Subjective:    Patient ID: Kyle Gonzales, male    DOB: [birth year], 25 y.o.   MRN: [id number]  HPI  This nice 25 yo SBM was dx'd and treated  For Grave's Dz in Nov 2016 with I-131 and was followed until TSH elevated at 93.83 on 12/06/2015 an he was started on L-Thyroxine 125 mcg. He disavows any sx's consistent with Hypo or hyperthyroidism. Also his lipid profile was marked ly abn at that time with T Chol 352 & LDL 212.   Medication Sig  . atenolol  100 MG tablet Take 1 tablet (100 mg total) by mouth at bedtime.  Marland Kitchen FLONASE nasal spray 2 sprays by Each Nare route daily.  Marland Kitchen levothyroxine 125 MCG tablet Take 1 tablet every morning on an empty stomach for 30 minutes  . traZODone  50 MG tablet 1/2-2 pills at bed time for sleep   No Known Allergies   Past Medical History  Diagnosis Date  . Graves disease 08/18/2015   Review of Systems 10 point systems review negative except as above.    Objective:   Physical Exam  BP 128/76 mmHg  Pulse 56  Temp(Src) 97.5 F (36.4 C)  Resp 16  Ht 6\' 3"  (1.905 m)  Wt 242 lb 12.8 oz (110.133 kg)  BMI 30.35 kg/m2  HEENT - Eac's patent. TM's Nl. EOM's full. PERRLA. NasoOroPharynx clear. Neck - supple. Nl Thyroid. Carotids 2+ & No bruits, nodes, JVD Chest - Clear equal BS w/o Rales, rhonchi, wheezes. Cor - Nl HS. RRR w/o sig MGR. PP 1(+). No edema. Abd - No palpable organomegaly, masses or tenderness. BS nl. MS- FROM w/o deformities. Muscle power, tone and bulk Nl. Gait Nl. Neuro - No obvious Cr N abnormalities. Sensory, motor and Cerebellar functions appear Nl w/o focal abnormalities.    Assessment & Plan:   1. Hypothyroidism, unspecified hypothyroidism type  - TSH  2. Hyperlipidemia  - Lipid panel  3. Medication management

## 2016-01-12 LAB — LIPID PANEL
CHOL/HDL RATIO: 3.3 ratio (ref <=5.0)
Cholesterol: 202 mg/dL — ABNORMAL HIGH (ref 125–200)
HDL: 62 mg/dL (ref >=40)
LDL Cholesterol: 117 mg/dL (ref <130)
Triglycerides: 114 mg/dL (ref <150)
VLDL: 23 mg/dL (ref <30)

## 2016-01-23 ENCOUNTER — Other Ambulatory Visit: Payer: Self-pay | Admitting: Physician Assistant

## 2016-02-10 ENCOUNTER — Ambulatory Visit: Payer: Self-pay | Admitting: Internal Medicine

## 2016-02-20 ENCOUNTER — Other Ambulatory Visit: Payer: Self-pay | Admitting: Internal Medicine

## 2016-03-02 ENCOUNTER — Other Ambulatory Visit: Payer: 59

## 2016-03-02 DIAGNOSIS — E039 Hypothyroidism, unspecified: Secondary | ICD-10-CM

## 2016-03-02 LAB — TSH: TSH: 2.4 mIU/L (ref 0.40–4.50)

## 2016-03-02 NOTE — Progress Notes (Unsigned)
Patient unsure of current dose of Levothyroxine.  States "somedays he takes 1 pill and somedays he takes 1 and 1/2 pills but not certain of the days or dosage".

## 2016-03-14 ENCOUNTER — Ambulatory Visit: Payer: Self-pay | Admitting: Internal Medicine

## 2016-03-28 ENCOUNTER — Other Ambulatory Visit: Payer: Self-pay | Admitting: Physician Assistant

## 2016-03-29 ENCOUNTER — Ambulatory Visit: Payer: Self-pay | Admitting: Internal Medicine

## 2016-04-05 ENCOUNTER — Other Ambulatory Visit: Payer: Self-pay | Admitting: Physician Assistant

## 2016-04-05 MED ORDER — TRAZODONE HCL 50 MG PO TABS: ORAL_TABLET | ORAL | Status: DC

## 2016-04-12 ENCOUNTER — Encounter: Payer: Self-pay | Admitting: Internal Medicine

## 2016-04-12 ENCOUNTER — Ambulatory Visit (INDEPENDENT_AMBULATORY_CARE_PROVIDER_SITE_OTHER): Payer: 59 | Admitting: Internal Medicine

## 2016-04-12 VITALS — BP 132/76 | HR 92 | Temp 98.2°F | Resp 16 | Ht 75.0 in | Wt 245.0 lb

## 2016-04-12 DIAGNOSIS — E0500 Thyrotoxicosis with diffuse goiter without thyrotoxic crisis or storm: Secondary | ICD-10-CM

## 2016-04-12 DIAGNOSIS — E039 Hypothyroidism, unspecified: Secondary | ICD-10-CM

## 2016-04-12 DIAGNOSIS — E785 Hyperlipidemia, unspecified: Secondary | ICD-10-CM

## 2016-04-12 DIAGNOSIS — R002 Palpitations: Secondary | ICD-10-CM

## 2016-04-12 DIAGNOSIS — Z9114 Patient's other noncompliance with medication regimen: Secondary | ICD-10-CM

## 2016-04-12 DIAGNOSIS — Z79899 Other long term (current) drug therapy: Secondary | ICD-10-CM | POA: Diagnosis not present

## 2016-04-12 DIAGNOSIS — R7303 Prediabetes: Secondary | ICD-10-CM

## 2016-04-12 DIAGNOSIS — F1210 Cannabis abuse, uncomplicated: Secondary | ICD-10-CM

## 2016-04-12 DIAGNOSIS — R079 Chest pain, unspecified: Secondary | ICD-10-CM

## 2016-04-12 DIAGNOSIS — E559 Vitamin D deficiency, unspecified: Secondary | ICD-10-CM

## 2016-04-12 LAB — CBC WITH DIFFERENTIAL/PLATELET
BASOS ABS: 0 {cells}/uL (ref 0–200)
BASOS PCT: 0 %
EOS ABS: 44 {cells}/uL (ref 15–500)
Eosinophils Relative: 1 %
HEMATOCRIT: 41.3 % (ref 38.5–50.0)
Hemoglobin: 14.1 g/dL (ref 13.2–17.1)
LYMPHS PCT: 22 %
Lymphs Abs: 968 cells/uL (ref 850–3900)
MCH: 30.6 pg (ref 27.0–33.0)
MCHC: 34.1 g/dL (ref 32.0–36.0)
MCV: 89.6 fL (ref 80.0–100.0)
MONO ABS: 352 {cells}/uL (ref 200–950)
MPV: 10.3 fL (ref 7.5–12.5)
Monocytes Relative: 8 %
NEUTROS PCT: 69 %
Neutro Abs: 3036 cells/uL (ref 1500–7800)
Platelets: 134 10*3/uL — ABNORMAL LOW (ref 140–400)
RBC: 4.61 MIL/uL (ref 4.20–5.80)
RDW: 13.7 % (ref 11.0–15.0)
WBC: 4.4 10*3/uL (ref 3.8–10.8)

## 2016-04-12 LAB — BASIC METABOLIC PANEL WITH GFR
BUN: 8 mg/dL (ref 7–25)
CO2: 26 mmol/L (ref 20–31)
CREATININE: 0.92 mg/dL (ref 0.60–1.35)
Calcium: 10.1 mg/dL (ref 8.6–10.3)
Chloride: 103 mmol/L (ref 98–110)
GFR, Est Non African American: >89 mL/min (ref >=60)
GLUCOSE: 101 mg/dL — AB (ref 65–99)
POTASSIUM: 4.2 mmol/L (ref 3.5–5.3)
Sodium: 141 mmol/L (ref 135–146)

## 2016-04-12 LAB — LIPID PANEL
Cholesterol: 186 mg/dL (ref 125–200)
HDL: 58 mg/dL (ref >=40)
LDL Cholesterol: 105 mg/dL (ref <130)
Total CHOL/HDL Ratio: 3.2 Ratio (ref <=5.0)
Triglycerides: 115 mg/dL (ref <150)
VLDL: 23 mg/dL (ref <30)

## 2016-04-12 LAB — HEPATIC FUNCTION PANEL
ALBUMIN: 4.7 g/dL (ref 3.6–5.1)
ALK PHOS: 89 U/L (ref 40–115)
ALT: 255 U/L — ABNORMAL HIGH (ref 9–46)
AST: 368 U/L — AB (ref 10–40)
Bilirubin, Direct: 0.2 mg/dL (ref <=0.2)
Indirect Bilirubin: 0.6 mg/dL (ref 0.2–1.2)
TOTAL PROTEIN: 7.3 g/dL (ref 6.1–8.1)
Total Bilirubin: 0.8 mg/dL (ref 0.2–1.2)

## 2016-04-12 LAB — TSH: TSH: 0.07 mIU/L — ABNORMAL LOW (ref 0.40–4.50)

## 2016-04-12 MED ORDER — ATENOLOL 100 MG PO TABS: 100.0000 mg | ORAL_TABLET | Freq: Every day | ORAL | Status: DC

## 2016-04-12 MED ORDER — LEVOTHYROXINE SODIUM 125 MCG PO TABS: ORAL_TABLET | ORAL | Status: DC

## 2016-04-12 MED ORDER — RANITIDINE HCL 300 MG PO TABS: ORAL_TABLET | ORAL | Status: DC

## 2016-04-12 MED ORDER — TRAZODONE HCL 50 MG PO TABS: ORAL_TABLET | ORAL | Status: DC

## 2016-04-12 MED ORDER — FLUTICASONE PROPIONATE 50 MCG/ACT NA SUSP: NASAL | Status: DC

## 2016-04-12 NOTE — Progress Notes (Signed)
Assessment and Plan:  Hypertension:  -Continue medication,  -monitor blood pressure at home.  -Continue DASH diet.   -Reminder to go to the ER if any CP, SOB, nausea, dizziness, severe HA, changes vision/speech, left arm numbness and tingling, and jaw pain.  Cholesterol: -Continue diet and exercise.  -Check cholesterol.   Pre-diabetes: -Continue diet and exercise.  -Check A1C  Vitamin D Def: -check level -continue medications.   Palpitations -EKG- NSR -secondary to marijuana use -cont atenolol  Graves Disease with hypothyroid after I-131 ablation -TSH -suspicion that he is not taking his thyroid medication properly  Chest pain -EKG normal -possible reflux -try zantac -if no improvement will send to Cards  Marijuana abuse -suggesting quitting  Non-compliance -spoke with patient about proper use of medication  Continue diet and meds as discussed. Further disposition pending results of labs.  HPI 25 y.o. male  presents for 3 month follow up with hypertension, hyperlipidemia, prediabetes and vitamin D.   His blood pressure has been controlled at home, today their BP is BP: 132/76 mmHg.   He does not workout. He denies chest pain, shortness of breath, dizziness.   He is on cholesterol medication and denies myalgias. His cholesterol is at goal. The cholesterol last visit was:   Lab Results  Component Value Date   CHOL 202* 01/11/2016   HDL 62 01/11/2016   LDLCALC 117 01/11/2016   TRIG 114 01/11/2016   CHOLHDL 3.3 01/11/2016     He has been working on diet and exercise for prediabetes, and denies foot ulcerations, hyperglycemia, hypoglycemia , increased appetite, nausea, paresthesia of the feet, polydipsia, polyuria, visual disturbances, vomiting and weight loss. Last A1C in the office was:  Lab Results  Component Value Date   HGBA1C 5.7* 12/06/2015    Patient is on Vitamin D supplement.  Lab Results  Component Value Date   VD25OH 20* 12/06/2015     He  reports that sometimes he feels like he sometimes has some discomfort in his chest.  He reports that it happens a lot more after he smokes marijuana.  He reports that it does happen sometimes when he isn't smoking.  He does not have any shortness or breath or dizziness when this happens. He is taking his atenolol.  He reports that he takes it in the evening.    He reports that he has a hard time with taking his thyroid medication and eating.  Per nurse report he thinks that he is taking thyroid medication daily with sometimes takes 1.5 tablets per day.      He reports that he does get dizzy and lightheaded with walking.  He reports that he has never fainted.  He does not get dizzy with non-exertional acitivites.  He does feel like he gets tired really fast.    Current Medications:  Current Outpatient Prescriptions on File Prior to Visit  Medication Sig Dispense Refill  . atenolol (TENORMIN) 100 MG tablet take 1 tablet by mouth at bedtime 90 tablet 1  . fluticasone (FLONASE) 50 MCG/ACT nasal spray 2 sprays by Each Nare route daily.    Marland Kitchen levothyroxine (SYNTHROID, LEVOTHROID) 125 MCG tablet take 1 tablet by mouth every morning ON AN EMPTY STOMACH 90 tablet 1  . traZODone (DESYREL) 50 MG tablet 1/2-2 pills at bed time for sleep 60 tablet 0   No current facility-administered medications on file prior to visit.    Medical History:  Past Medical History  Diagnosis Date  . Graves disease 08/18/2015  Allergies: No Known Allergies   Review of Systems:  Review of Systems  Constitutional: Negative for fever, chills and malaise/fatigue.  HENT: Negative for congestion, ear pain and sore throat.   Respiratory: Negative for cough, shortness of breath and wheezing.   Cardiovascular: Negative for chest pain, palpitations and leg swelling.  Gastrointestinal: Positive for heartburn. Negative for abdominal pain, diarrhea, constipation, blood in stool and melena.  Genitourinary: Negative.   Skin:  Negative.   Neurological: Negative for dizziness, sensory change, loss of consciousness and headaches.       Lightheaded with activity  Psychiatric/Behavioral: Negative for depression. The patient is not nervous/anxious and does not have insomnia.     Family history- Review and unchanged  Social history- Review and unchanged  Physical Exam: BP 132/76 mmHg  Pulse 92  Temp(Src) 98.2 F (36.8 C) (Temporal)  Resp 16  Ht 6\' 3"  (1.905 m)  Wt 245 lb (111.131 kg)  BMI 30.62 kg/m2 Wt Readings from Last 3 Encounters:  04/12/16 245 lb (111.131 kg)  01/11/16 242 lb 12.8 oz (110.133 kg)  12/06/15 240 lb 12.8 oz (109.226 kg)    General Appearance: Well nourished well developed, in no apparent distress. Eyes: PERRLA, EOMs, conjunctiva no swelling or erythema ENT/Mouth: Ear canals normal without obstruction, swelling, erythma, discharge.  TMs normal bilaterally.  Oropharynx moist, clear, without exudate, or postoropharyngeal swelling. Neck: Supple, thyroid normal,no cervical adenopathy  Respiratory: Respiratory effort normal, Breath sounds clear A&P without rhonchi, wheeze, or rale.  No retractions, no accessory usage. Cardio: RRR with no MRGs. Brisk peripheral pulses without edema.  Abdomen: Soft, + BS,  Non tender, no guarding, rebound, hernias, masses. Musculoskeletal: Full ROM, 5/5 strength, Normal gait Skin: Warm, dry without rashes, lesions, ecchymosis.  Neuro: Awake and oriented X 3, Cranial nerves intact. Normal muscle tone, no cerebellar symptoms. Psych: Normal affect, Insight and Judgment appropriate.    Starlyn Skeans, PA-C 11:23 AM [facility]

## 2016-04-13 ENCOUNTER — Other Ambulatory Visit: Payer: Self-pay | Admitting: Internal Medicine

## 2016-04-13 LAB — HEMOGLOBIN A1C
HEMOGLOBIN A1C: 5.5 % (ref <5.7)
MEAN PLASMA GLUCOSE: 111 mg/dL

## 2016-04-13 MED ORDER — LEVOTHYROXINE SODIUM 75 MCG PO TABS: ORAL_TABLET | ORAL | Status: DC

## 2016-04-21 ENCOUNTER — Encounter: Payer: Self-pay | Admitting: Physician Assistant

## 2016-05-13 ENCOUNTER — Other Ambulatory Visit: Payer: Self-pay | Admitting: Internal Medicine

## 2016-05-25 ENCOUNTER — Other Ambulatory Visit: Payer: 59

## 2016-05-25 DIAGNOSIS — E039 Hypothyroidism, unspecified: Secondary | ICD-10-CM

## 2016-05-25 DIAGNOSIS — Z79899 Other long term (current) drug therapy: Secondary | ICD-10-CM

## 2016-05-25 LAB — HEPATIC FUNCTION PANEL
ALBUMIN: 4.9 g/dL (ref 3.6–5.1)
ALT: 75 U/L — ABNORMAL HIGH (ref 9–46)
AST: 33 U/L (ref 10–40)
Alkaline Phosphatase: 89 U/L (ref 40–115)
BILIRUBIN TOTAL: 0.8 mg/dL (ref 0.2–1.2)
Bilirubin, Direct: 0.2 mg/dL (ref <=0.2)
Indirect Bilirubin: 0.6 mg/dL (ref 0.2–1.2)
Total Protein: 7.5 g/dL (ref 6.1–8.1)

## 2016-05-25 LAB — TSH: TSH: 4.07 m[IU]/L (ref 0.40–4.50)

## 2016-06-22 ENCOUNTER — Ambulatory Visit: Payer: Self-pay | Admitting: Internal Medicine

## 2016-06-24 ENCOUNTER — Encounter: Payer: Self-pay | Admitting: Physician Assistant

## 2016-07-24 ENCOUNTER — Other Ambulatory Visit: Payer: 59

## 2016-07-24 DIAGNOSIS — E039 Hypothyroidism, unspecified: Secondary | ICD-10-CM

## 2016-07-24 LAB — TSH: TSH: 1.71 mIU/L (ref 0.40–4.50)

## 2016-07-26 ENCOUNTER — Other Ambulatory Visit: Payer: Self-pay | Admitting: Internal Medicine

## 2016-08-01 ENCOUNTER — Other Ambulatory Visit: Payer: Self-pay | Admitting: Internal Medicine

## 2016-09-29 ENCOUNTER — Other Ambulatory Visit: Payer: Self-pay | Admitting: *Deleted

## 2016-10-02 ENCOUNTER — Other Ambulatory Visit: Payer: Self-pay | Admitting: *Deleted

## 2016-10-02 MED ORDER — BISOPROLOL-HYDROCHLOROTHIAZIDE 10-6.25 MG PO TABS: 1.0000 | ORAL_TABLET | Freq: Every day | ORAL | 3 refills | Status: DC

## 2016-10-25 ENCOUNTER — Other Ambulatory Visit: Payer: Self-pay | Admitting: Internal Medicine

## 2016-11-20 ENCOUNTER — Ambulatory Visit: Payer: Self-pay | Admitting: Internal Medicine

## 2016-12-12 ENCOUNTER — Encounter: Payer: Self-pay | Admitting: Internal Medicine

## 2016-12-18 ENCOUNTER — Ambulatory Visit: Payer: Self-pay | Admitting: Internal Medicine

## 2016-12-27 ENCOUNTER — Ambulatory Visit (INDEPENDENT_AMBULATORY_CARE_PROVIDER_SITE_OTHER): Payer: 59 | Admitting: Internal Medicine

## 2016-12-27 ENCOUNTER — Encounter: Payer: Self-pay | Admitting: Internal Medicine

## 2016-12-27 VITALS — BP 130/82 | HR 64 | Temp 97.9°F | Resp 16 | Ht 75.0 in | Wt 251.2 lb

## 2016-12-27 DIAGNOSIS — R5383 Other fatigue: Secondary | ICD-10-CM

## 2016-12-27 DIAGNOSIS — E782 Mixed hyperlipidemia: Secondary | ICD-10-CM

## 2016-12-27 DIAGNOSIS — Z0000 Encounter for general adult medical examination without abnormal findings: Secondary | ICD-10-CM | POA: Diagnosis not present

## 2016-12-27 DIAGNOSIS — E890 Postprocedural hypothyroidism: Secondary | ICD-10-CM

## 2016-12-27 DIAGNOSIS — Z111 Encounter for screening for respiratory tuberculosis: Secondary | ICD-10-CM

## 2016-12-27 DIAGNOSIS — R0989 Other specified symptoms and signs involving the circulatory and respiratory systems: Secondary | ICD-10-CM

## 2016-12-27 DIAGNOSIS — Z79899 Other long term (current) drug therapy: Secondary | ICD-10-CM

## 2016-12-27 DIAGNOSIS — E559 Vitamin D deficiency, unspecified: Secondary | ICD-10-CM

## 2016-12-27 DIAGNOSIS — Z0001 Encounter for general adult medical examination with abnormal findings: Secondary | ICD-10-CM

## 2016-12-27 DIAGNOSIS — Z1212 Encounter for screening for malignant neoplasm of rectum: Secondary | ICD-10-CM

## 2016-12-27 DIAGNOSIS — Z131 Encounter for screening for diabetes mellitus: Secondary | ICD-10-CM

## 2016-12-27 LAB — HEPATIC FUNCTION PANEL
ALT: 122 U/L — ABNORMAL HIGH (ref 9–46)
AST: 71 U/L — ABNORMAL HIGH (ref 10–40)
Albumin: 4.7 g/dL (ref 3.6–5.1)
Alkaline Phosphatase: 66 U/L (ref 40–115)
BILIRUBIN DIRECT: 0.1 mg/dL (ref <=0.2)
BILIRUBIN INDIRECT: 0.3 mg/dL (ref 0.2–1.2)
BILIRUBIN TOTAL: 0.4 mg/dL (ref 0.2–1.2)
Total Protein: 7.6 g/dL (ref 6.1–8.1)

## 2016-12-27 LAB — IRON AND TIBC
%SAT: 13 % — AB (ref 15–60)
IRON: 51 ug/dL (ref 50–195)
TIBC: 386 ug/dL (ref 250–425)
UIBC: 335 ug/dL (ref 125–400)

## 2016-12-27 LAB — CBC WITH DIFFERENTIAL/PLATELET
BASOS ABS: 0 {cells}/uL (ref 0–200)
Basophils Relative: 0 %
EOS ABS: 51 {cells}/uL (ref 15–500)
Eosinophils Relative: 1 %
HCT: 43.5 % (ref 38.5–50.0)
Hemoglobin: 14 g/dL (ref 13.2–17.1)
LYMPHS PCT: 24 %
Lymphs Abs: 1224 cells/uL (ref 850–3900)
MCH: 29 pg (ref 27.0–33.0)
MCHC: 32.2 g/dL (ref 32.0–36.0)
MCV: 90.1 fL (ref 80.0–100.0)
MONOS PCT: 9 %
MPV: 10 fL (ref 7.5–12.5)
Monocytes Absolute: 459 cells/uL (ref 200–950)
NEUTROS PCT: 66 %
Neutro Abs: 3366 cells/uL (ref 1500–7800)
Platelets: 124 10*3/uL — ABNORMAL LOW (ref 140–400)
RBC: 4.83 MIL/uL (ref 4.20–5.80)
RDW: 15.5 % — AB (ref 11.0–15.0)
WBC: 5.1 10*3/uL (ref 3.8–10.8)

## 2016-12-27 LAB — BASIC METABOLIC PANEL WITH GFR
BUN: 12 mg/dL (ref 7–25)
CHLORIDE: 101 mmol/L (ref 98–110)
CO2: 27 mmol/L (ref 20–31)
CREATININE: 0.97 mg/dL (ref 0.60–1.35)
Calcium: 9.5 mg/dL (ref 8.6–10.3)
GFR, Est Non African American: >89 mL/min (ref >=60)
Glucose, Bld: 109 mg/dL — ABNORMAL HIGH (ref 65–99)
Potassium: 4.2 mmol/L (ref 3.5–5.3)
Sodium: 137 mmol/L (ref 135–146)

## 2016-12-27 LAB — LIPID PANEL
CHOL/HDL RATIO: 4.2 ratio (ref <5.0)
CHOLESTEROL: 240 mg/dL — AB (ref <200)
HDL: 57 mg/dL (ref >40)
LDL Cholesterol: 155 mg/dL — ABNORMAL HIGH (ref <100)
TRIGLYCERIDES: 142 mg/dL (ref <150)
VLDL: 28 mg/dL (ref <30)

## 2016-12-27 LAB — TSH: TSH: 1.79 mIU/L (ref 0.40–4.50)

## 2016-12-27 LAB — VITAMIN B12: Vitamin B-12: 611 pg/mL (ref 200–1100)

## 2016-12-27 LAB — MAGNESIUM: MAGNESIUM: 2 mg/dL (ref 1.5–2.5)

## 2016-12-27 MED ORDER — CITALOPRAM HYDROBROMIDE 40 MG PO TABS: ORAL_TABLET | ORAL | 1 refills | Status: DC

## 2016-12-27 NOTE — Patient Instructions (Addendum)
Obsessive-Compulsive Disorder Obsessive-compulsive disorder (OCD) is a brain-based anxiety disorder. People with OCD have obsessions, compulsions, or both. Obsessions are unwanted and distressing thoughts, ideas, or urges that keep entering your mind and result in anxiety. You may find yourself trying to ignore them. You may try to stop or undo them with a compulsion. Compulsions are repetitive physical or mental acts that you feel you have to do. They may reduce or prevent any emotional distress, but in most instances, they are ineffective. Compulsions can be very time-consuming, often taking more than one hour each day. They can interfere with personal relationships and normal activities at home, school, or work. OCD can begin in childhood, but it usually starts in young adulthood and continues throughout life. Many people with OCD also have depression or another mental health disorder. What are the causes? The cause of this condition is not known. What increases the risk? This condition is more like to develop in:  People who have experienced trauma.  People who have a family history of OCD.  Women during and after pregnancy.  People who have infections and post-infectious autoimmune syndrome.  People who have other mental health conditions.  People who abuse substances. What are the signs or symptoms? Symptoms of OCD include compulsions and obsessions. People with obsessions usually have a fear that something terrible will happen or that they will do something terrible. Examples of common obsessions include:  Fear of contamination with germs, waste, or poisonous substances.  Fear of making the wrong decision.  Violent or sexual thoughts or urges towards others.  Need for symmetry or exactness. Examples of common compulsions include:  Excessive handwashing or bathing due to fear of contamination.  Checking things over and over to make sure you finished a task, such as making sure  you locked a door or unplugged a toaster.  Repeating an act or phrase over and over, sometimes a specific number of times, until it feels right.  Arranging and rearranging objects to keep them in a certain order.  Having a very hard time making a decision and sticking to it. How is this diagnosed? OCD is diagnosed through an assessment by your health care provider. Your health care provider will ask questions about any obsessions or compulsions you have and how they affect your life. Your health care provider may also ask about your medical history, prescription medicines, and drug use. Certain medical conditions and substances can cause symptoms that are similar to OCD. Your health care provider may also refer you to a mental health specialist. How is this treated? Treatment may include:  Cognitive therapy. This is a form of talk therapy. The goal is to identify and change the irrational thoughts associated with obsessions.  Behavioral therapy. A type of behavioral therapy called exposure and response prevention is often used. In this therapy, you will be exposed to the distressing situation that triggers your compulsion and be prevented from responding to it. With repetition of this process over time, you will no longer feel the distress or need to perform the compulsion.  Self-soothing. Meditation, deep breathing, or yoga can help you manage the physiological symptoms of anxiety and can help with how you think.  Medicine. Certain types of antidepressant medicine may help reduce or control OCD symptoms. Medicine is most effective when used with cognitive or behavioral therapy. Treatment usually involves a combination of therapy and medicines. For severe OCD that does not respond to talk therapy and medicine, brain surgery or electrical stimulation of  specific areas of the brain (deep brain stimulation) may be considered. Follow these instructions at home:  Take over-the-counter and  prescription medicines only as told by your health care provider. Do not start taking any new medicines with approval from your health care provider.  Consider joining a support group for people with OCD.  Keep all follow-up visits as told by your health care provider. This is important. Contact a health care provider if:  You are not able to take your medicines as prescribed.  Your symptoms get worse. Get help right away if:  You have suicidal thoughts or thoughts about hurting yourself or others. If you ever feel like you may hurt yourself or others, or have thoughts about taking your own life, get help right away. You can go to your nearest emergency department or call:  Your local emergency services (911 in the [place]  A suicide crisis helpline, such as the [facility] at 252-044-8686. This is open 24-hours a day. Summary  Obsessive-compulsive disorder (OCD) is a brain-based anxiety disorder. People with OCD have obsessions, compulsions, or both.  OCD can interfere with personal relationships and normal activities at home, school, or work.  Treatment usually involves a combination of therapy and medicines. This information is not intended to replace advice given to you by your health care provider. Make sure you discuss any questions you have with your health care provider. Document Released: 09/26/2001 Document Revised: 07/17/2016 Document Reviewed: 07/17/2016 Elsevier Interactive Patient Education  2017 [place].  +++++++++++++++++++++++ Preventive Care for Adults  A healthy lifestyle and preventive care can promote health and wellness. Preventive health guidelines for men include the following key practices:  A routine yearly physical is a good way to check with your health care provider about your health and preventative screening. It is a chance to share any concerns and updates on your health and to receive a thorough exam.  Visit your  dentist for a routine exam and preventative care every 6 months. Brush your teeth twice a day and floss once a day. Good oral hygiene prevents tooth decay and gum disease.  The frequency of eye exams is based on your age, health, family medical history, use of contact lenses, and other factors. Follow your health care provider's recommendations for frequency of eye exams.  Eat a healthy diet. Foods such as vegetables, fruits, whole grains, low-fat dairy products, and lean protein foods contain the nutrients you need without too many calories. Decrease your intake of foods high in solid fats, added sugars, and salt. Eat the right amount of calories for you.Get information about a proper diet from your health care provider, if necessary.  Regular physical exercise is one of the most important things you can do for your health. Most adults should get at least 150 minutes of moderate-intensity exercise (any activity that increases your heart rate and causes you to sweat) each week. In addition, most adults need muscle-strengthening exercises on 2 or more days a week.  Maintain a healthy weight. The body mass index (BMI) is a screening tool to identify possible weight problems. It provides an estimate of body fat based on height and weight. Your health care provider can find your BMI and can help you achieve or maintain a healthy weight.For adults 20 years and older:  A BMI below 18.5 is considered underweight.  A BMI of 18.5 to 24.9 is normal.  A BMI of 25 to 29.9 is considered overweight.  A BMI of 30  and above is considered obese.  Maintain normal blood lipids and cholesterol levels by exercising and minimizing your intake of saturated fat. Eat a balanced diet with plenty of fruit and vegetables. Blood tests for lipids and cholesterol should begin at age 60 and be repeated every 5 years. If your lipid or cholesterol levels are high, you are over 50, or you are at high risk for heart disease, you  may need your cholesterol levels checked more frequently.Ongoing high lipid and cholesterol levels should be treated with medicines if diet and exercise are not working.  If you smoke, find out from your health care provider how to quit. If you do not use tobacco, do not start.  Lung cancer screening is recommended for adults aged 56-80 years who are at high risk for developing lung cancer because of a history of smoking. A yearly low-dose CT scan of the lungs is recommended for people who have at least a 30-pack-year history of smoking and are a current smoker or have quit within the past 15 years. A pack year of smoking is smoking an average of 1 pack of cigarettes a day for 1 year (for example: 1 pack a day for 30 years or 2 packs a day for 15 years). Yearly screening should continue until the smoker has stopped smoking for at least 15 years. Yearly screening should be stopped for people who develop a health problem that would prevent them from having lung cancer treatment.  If you choose to drink alcohol, do not have more than 2 drinks per day. One drink is considered to be 12 ounces (355 mL) of beer, 5 ounces (148 mL) of wine, or 1.5 ounces (44 mL) of liquor.  High blood pressure causes heart disease and increases the risk of stroke. Your blood pressure should be checked. Ongoing high blood pressure should be treated with medicines, if weight loss and exercise are not effective.  If you are 55-67 years old, ask your health care provider if you should take aspirin to prevent heart disease.  Diabetes screening involves taking a blood sample to check your fasting blood sugar level. Testing should be considered at a younger age or be carried out more frequently if you are overweight and have at least 1 risk factor for diabetes.  Colorectal cancer can be detected and often prevented. Most routine colorectal cancer screening begins at the age of 56 and continues through age 61. However, your health  care provider may recommend screening at an earlier age if you have risk factors for colon cancer. On a yearly basis, your health care provider may provide home test kits to check for hidden blood in the stool. Use of a small camera at the end of a tube to directly examine the colon (sigmoidoscopy or colonoscopy) can detect the earliest forms of colorectal cancer. Talk to your health care provider about this at age 42, when routine screening begins. Direct exam of the colon should be repeated every 5-10 years through age 26, unless early forms of precancerous polyps or small growths are found.  Screening for abdominal aortic aneurysm (AAA)  are recommended for persons over age 55 who have history of hypertensionor who are current or former smokers.  Talk with your health care provider about prostate cancer screening.  Testicular cancer screening is recommended for adult males. Screening includes self-exam, a health care provider exam, and other screening tests. Consult with your health care provider about any symptoms you have or any concerns you have  about testicular cancer.  Use sunscreen. Apply sunscreen liberally and repeatedly throughout the day. You should seek shade when your shadow is shorter than you. Protect yourself by wearing long sleeves, pants, a wide-brimmed hat, and sunglasses year round, whenever you are outdoors.  Once a month, do a whole-body skin exam, using a mirror to look at the skin on your back. Tell your health care provider about new moles, moles that have irregular borders, moles that are larger than a pencil eraser, or moles that have changed in shape or color.  Stay current with required vaccines (immunizations).  Influenza vaccine. All adults should be immunized every year.  Tetanus, diphtheria, and acellular pertussis (Td, Tdap) vaccine. An adult who has not previously received Tdap or who does not know his vaccine status should receive 1 dose of Tdap. This initial  dose should be followed by tetanus and diphtheria toxoids (Td) booster doses every 10 years. Adults with an unknown or incomplete history of completing a 3-dose immunization series with Td-containing vaccines should begin or complete a primary immunization series including a Tdap dose. Adults should receive a Td booster every 10 years.  Zoster vaccine. One dose is recommended for adults aged 57 years or older unless certain conditions are present.    Pneumococcal 13-valent conjugate (PCV13) vaccine. When indicated, a person who is uncertain of his immunization history and has no record of immunization should receive the PCV13 vaccine. An adult aged 62 years or older who has certain medical conditions and has not been previously immunized should receive 1 dose of PCV13 vaccine. This PCV13 should be followed with a dose of pneumococcal polysaccharide (PPSV23) vaccine. The PPSV23 vaccine dose should be obtained at least 8 weeks after the dose of PCV13 vaccine. An adult aged 24 years or older who has certain medical conditions and previously received 1 or more doses of PPSV23 vaccine should receive 1 dose of PCV13. The PCV13 vaccine dose should be obtained 1 or more years after the last PPSV23 vaccine dose.    Pneumococcal polysaccharide (PPSV23) vaccine. When PCV13 is also indicated, PCV13 should be obtained first. All adults aged 37 years and older should be immunized. An adult younger than age 61 years who has certain medical conditions should be immunized. Any person who resides in a nursing home or long-term care facility should be immunized. An adult smoker should be immunized. People with an immunocompromised condition and certain other conditions should receive both PCV13 and PPSV23 vaccines. People with human immunodeficiency virus (HIV) infection should be immunized as soon as possible after diagnosis. Immunization during chemotherapy or radiation therapy should be avoided. Routine use of PPSV23  vaccine is not recommended for American Indians, [place] Natives, or people younger than 65 years unless there are medical conditions that require PPSV23 vaccine. When indicated, people who have unknown immunization and have no record of immunization should receive PPSV23 vaccine. One-time revaccination 5 years after the first dose of PPSV23 is recommended for people aged 19-64 years who have chronic kidney failure, nephrotic syndrome, asplenia, or immunocompromised conditions. People who received 1-2 doses of PPSV23 before age 66 years should receive another dose of PPSV23 vaccine at age 84 years or later if at least 5 years have passed since the previous dose. Doses of PPSV23 are not needed for people immunized with PPSV23 at or after age 60 years.  Hepatitis A vaccine. Adults who wish to be protected from this disease, have certain high-risk conditions, work with hepatitis A-infected animals, work in hepatitis  A research labs, or travel to or work in countries with a high rate of hepatitis A should be immunized. Adults who were previously unvaccinated and who anticipate close contact with an international adoptee during the first 60 days after arrival in the [place] from a country with a high rate of hepatitis A should be immunized.  Hepatitis B vaccine. Adults should be immunized if they wish to be protected from this disease, have certain high-risk conditions, may be exposed to blood or other infectious body fluids, are household contacts or sex partners of hepatitis B positive people, are clients or workers in certain care facilities, or travel to or work in countries with a high rate of hepatitis B.  Preventive Service / Frequency  Ages 74 to 19  Blood pressure check.  Lipid and cholesterol check.  Hepatitis C blood test.** / For any individual with known risks for hepatitis C.  Skin self-exam. / Monthly.  Influenza vaccine. / Every year.  Tetanus, diphtheria, and acellular pertussis  (Tdap, Td) vaccine.** / Consult your health care provider. 1 dose of Td every 10 years.  HPV vaccine. / 3 doses over 6 months, if 35 or younger.  Measles, mumps, rubella (MMR) vaccine.** / You need at least 1 dose of MMR if you were born in 1957 or later. You may also need a second dose.  Pneumococcal 13-valent conjugate (PCV13) vaccine.** / Consult your health care provider.  Pneumococcal polysaccharide (PPSV23) vaccine.** / 1 to 2 doses if you smoke cigarettes or if you have certain conditions.  Meningococcal vaccine.** / 1 dose if you are age 11 to 69 years and a Market researcher living in a residence hall, or have one of several medical conditions. You may also need additional booster doses.  Hepatitis A vaccine.** / Consult your health care provider.  Hepatitis B vaccine.** / Consult your health care provider. +++++++++ Recommend Adult Low Dose Aspirin or  coated  Aspirin 81 mg daily  To reduce risk of Colon Cancer 20 %,  Skin Cancer 26 % ,  Melanoma 46%  and  Pancreatic cancer 60% ++++++++++++++++++ Vitamin D goal  is between 70-100.  Please make sure that you are taking your Vitamin D as directed.  It is very important as a natural anti-inflammatory  helping hair, skin, and nails, as well as reducing stroke and heart attack risk.  It helps your bones and helps with mood. It also decreases numerous cancer risks so please take it as directed.  Low Vit D is associated with a 200-300% higher risk for CANCER  and 200-300% higher risk for HEART   ATTACK  &  STROKE.   .....................................Marland Kitchen It is also associated with higher death rate at younger ages,  autoimmune diseases like Rheumatoid arthritis, Lupus, Multiple Sclerosis.    Also many other serious conditions, like depression, Alzheimer's Dementia, infertility, muscle aches, fatigue, fibromyalgia - just to name a few. +++++++++++++++++++++ Recommend the book "The END of DIETING" by Dr Excell Seltzer   & the book "The END of DIABETES " by Dr Excell Seltzer At [email or web address] - get book & Audio CD's    Being diabetic has a  300% increased risk for heart attack, stroke, cancer, and alzheimer- type vascular dementia. It is very important that you work harder with diet by avoiding all foods that are white. Avoid white rice (brown & wild rice is OK), white potatoes (sweetpotatoes in moderation is OK), White bread or wheat bread or anything made out of white flour  like bagels, donuts, rolls, buns, biscuits, cakes, pastries, cookies, pizza crust, and pasta (made from white flour & egg whites) - vegetarian pasta or spinach or wheat pasta is OK. Multigrain breads like Arnold's or Pepper[employer], or multigrain sandwich thins or flatbreads.  Diet, exercise and weight loss can reverse and cure diabetes in the early stages.  Diet, exercise and weight loss is very important in the control and prevention of complications of diabetes which affects every system in your body, ie. Brain - dementia/stroke, eyes - glaucoma/blindness, heart - heart attack/heart failure, kidneys - dialysis, stomach - gastric paralysis, intestines - malabsorption, nerves - severe painful neuritis, circulation - gangrene & loss of a leg(s), and finally cancer and Alzheimers.    I recommend avoid fried & greasy foods,  sweets/candy, white rice (brown or wild rice or Quinoa is OK), white potatoes (sweet potatoes are OK) - anything made from white flour - bagels, doughnuts, rolls, buns, biscuits,white and wheat breads, pizza crust and traditional pasta made of white flour & egg white(vegetarian pasta or spinach or wheat pasta is OK).  Multi-grain bread is OK - like multi-grain flat bread or sandwich thins. Avoid alcohol in excess. Exercise is also important.    Eat all the vegetables you want - avoid meat, especially red meat and dairy - especially cheese.  Cheese is the most concentrated form of trans-fats which is the worst thing to clog up our arteries.  Veggie cheese is OK which can be found in the fresh produce section at [employer] or [employer] or [employer]  +++++++++++++++++++ DASH Eating Plan  DASH stands for "Dietary Approaches to Stop Hypertension."   The DASH eating plan is a healthy eating plan that has been shown to reduce high blood pressure (hypertension). Additional health benefits may include reducing the risk of type 2 diabetes mellitus, heart disease, and stroke. The DASH eating plan may also help with weight loss. WHAT DO I NEED TO KNOW ABOUT THE DASH EATING PLAN? For the DASH eating plan, you will follow these general guidelines:  Choose foods with a percent daily value for sodium of less than 5% (as listed on the food label).  Use salt-free seasonings or herbs instead of table salt or sea salt.  Check with your health care provider or pharmacist before using salt substitutes.  Eat lower-sodium products, often labeled as "lower sodium" or "no salt added."  Eat fresh foods.  Eat more vegetables, fruits, and low-fat dairy products.  Choose whole grains. Look for the word "whole" as the first word in the ingredient list.  Choose fish   Limit sweets, desserts, sugars, and sugary drinks.  Choose heart-healthy fats.  Eat veggie cheese   Eat more home-cooked food and less restaurant, buffet, and fast food.  Limit fried foods.  Cook foods using methods other than frying.  Limit canned vegetables. If you do use them, rinse them well to decrease the sodium.  When eating at a restaurant, ask that your food be prepared with less salt, or no salt if possible.                      WHAT FOODS CAN I EAT? Read Dr Fara Olden Fuhrman's books on The End of Dieting & The End of Diabetes  Grains Whole grain or whole wheat bread. Brown rice. Whole grain or whole wheat pasta. Quinoa, bulgur, and whole grain cereals. Low-sodium cereals. Corn or whole wheat flour tortillas. Whole grain cornbread. Whole grain crackers. Low-sodium  crackers.  Vegetables Fresh or frozen vegetables (raw, steamed, roasted, or grilled). Low-sodium or reduced-sodium tomato and vegetable juices. Low-sodium or reduced-sodium tomato sauce and paste. Low-sodium or reduced-sodium canned vegetables.   Fruits All fresh, canned (in natural juice), or frozen fruits.  Protein Products  All fish and seafood.  Dried beans, peas, or lentils. Unsalted nuts and seeds. Unsalted canned beans.  Dairy Low-fat dairy products, such as skim or 1% milk, 2% or reduced-fat cheeses, low-fat ricotta or cottage cheese, or plain low-fat yogurt. Low-sodium or reduced-sodium cheeses.  Fats and Oils Tub margarines without trans fats. Light or reduced-fat mayonnaise and salad dressings (reduced sodium). Avocado. Safflower, olive, or canola oils. Natural peanut or almond butter.  Other Unsalted popcorn and pretzels. The items listed above may not be a complete list of recommended foods or beverages. Contact your dietitian for more options.  +++++++++++++++++++  WHAT FOODS ARE NOT RECOMMENDED? Grains/ White flour or wheat flour White bread. White pasta. White rice. Refined cornbread. Bagels and croissants. Crackers that contain trans fat.  Vegetables  Creamed or fried vegetables. Vegetables in a . Regular canned vegetables. Regular canned tomato sauce and paste. Regular tomato and vegetable juices.  Fruits Dried fruits. Canned fruit in light or heavy syrup. Fruit juice.  Meat and Other Protein Products Meat in general - RED meat & White meat.  Fatty cuts of meat. Ribs, chicken wings, all processed meats as bacon, sausage, bologna, salami, fatback, hot dogs, bratwurst and packaged luncheon meats.  Dairy Whole or 2% milk, cream, half-and-half, and cream cheese. Whole-fat or sweetened yogurt. Full-fat cheeses or blue cheese. Non-dairy creamers and whipped toppings. Processed cheese, cheese spreads, or cheese curds.  Condiments Onion and garlic salt, seasoned  salt, table salt, and sea salt. Canned and packaged gravies. Worcestershire sauce. Tartar sauce. Barbecue sauce. Teriyaki sauce. Soy sauce, including reduced sodium. Steak sauce. Fish sauce. Oyster sauce. Cocktail sauce. Horseradish. Ketchup and mustard. Meat flavorings and tenderizers. Bouillon cubes. Hot sauce. Tabasco sauce. Marinades. Taco seasonings. Relishes.  Fats and Oils Butter, stick margarine, lard, shortening and bacon fat. Coconut, palm kernel, or palm oils. Regular salad dressings.  Pickles and olives. Salted popcorn and pretzels.  The items listed above may not be a complete list of foods and beverages to avoid.

## 2016-12-27 NOTE — Progress Notes (Signed)
[facility]   [name], M.D.    Uvaldo Bristle. Silverio Lay, P.A.-C      Starlyn Skeans, P.A.-C  [facility]                [address] [place] [address] Telephone [phone] Telefax [phone] Annual  Screening/Preventative Visit  & Comprehensive Evaluation & Examination     This very nice 26 y.o. SBM presents for a Screening/Preventative Visit & comprehensive evaluation and management of multiple medical co-morbidities.  Patient has been followed for HTN, Prediabetes, Hyperlipidemia, post ablative Graves' Disease and Vitamin D Deficiency.     HTN predates since Feb 2016. Patient's BP has been controlled at home.  Today's BP 130/82. Patient denies any cardiac symptoms as chest pain, palpitations, shortness of breath, dizziness or ankle swelling.     In Feb 2017 patients' lipid were out of range with elevated T Chol 352 and LDL 212 when his Thyroid functions were underactive post I-131 RAI treatment. Patient's lipids are now controlled with diet since normalization of his thyroid function. Last lipids were at goal: Lab Results  Component Value Date   CHOL 186 04/12/2016   HDL 58 04/12/2016   LDLCALC 105 04/12/2016   TRIG 115 04/12/2016   CHOLHDL 3.2 04/12/2016      Patient has obesity (BMI 31+) and is screened proactively for prediabetes and patient denies reactive hypoglycemic symptoms, visual blurring, diabetic polys or paresthesias. Last A1c was at goal: Lab Results  Component Value Date   HGBA1C 5.5 04/12/2016       Patient has hx/o Graves' Disease (Nov 2016) treated with I-131 and started on replacement in Feb 2017. Finally, patient has history of Vitamin D Deficiency ("7" in Feb 2016) and last vitamin D was extremely low : Lab Results  Component Value Date   VD25OH 20 (L) 12/06/2015   Current Outpatient Prescriptions on File Prior to Visit  Medication Sig  . bisoprolol-hctz  10-6.25  Take 1 tablet by mouth daily.  Marland Kitchen FLONASE nasal spray instill 2 sprays into each nostril once daily  . levothyroxine  75 MCG tablet take 1 tablet by mouth every morning ON AN EMPTY STOMACH  . ranitidine 300 MG tablet Take 1 tablet as needed for heartburn.  Do not exceed 2 tablets in 24 hours.  . traZODone  50 MG tablet take 1/2 to 2 tablets by mouth at bedtime if needed for sleep   No Known Allergies   Past Medical History:  Diagnosis Date  . Graves disease 08/18/2015   Health Maintenance  Topic Date Due  . INFLUENZA VACCINE  05/16/2016  . TETANUS/TDAP  10/16/2018  . HIV Screening  Completed   Immunization History  Administered Date(s) Administered  . PPD Test 12/06/2015  . Td 10/16/2008   Past Surgical History:  Procedure Laterality Date  . ANTERIOR CRUCIATE LIGAMENT REPAIR  2012   No family history on file. Social History   Social History  . Marital status: Single    Spouse name: N/A  . Number of children: N/A  . Years of education: N/A   Occupational History  . Currently unemployed and living at home. Quit last job because only paid $8 - minimum wage.    Social History Main Topics  . Smoking status: Current Every Day Smoker  . Smokeless tobacco: Never Used  . Alcohol use No  .  Drug use: Smokes MJ 2 x/da  . Sexual activity: Active    ROS Constitutional: Denies fever, chills, weight loss/gain, headaches, insomnia,  night sweats or change in appetite. Does c/o fatigue. Eyes: Denies redness, blurred vision, diplopia, discharge, itchy or watery eyes.  ENT: Denies discharge, congestion, post nasal drip, epistaxis, sore throat, earache, hearing loss, dental pain, Tinnitus, Vertigo, Sinus pain or snoring.  Cardio: Denies chest pain, palpitations, irregular heartbeat, syncope, dyspnea, diaphoresis, orthopnea, PND, claudication or edema Respiratory: denies cough, dyspnea, DOE, pleurisy, hoarseness, laryngitis or wheezing.  Gastrointestinal: Denies dysphagia,  heartburn, reflux, water brash, pain, cramps, nausea, vomiting, bloating, diarrhea, constipation, hematemesis, melena, hematochezia, jaundice or hemorrhoids Genitourinary: Denies dysuria, frequency, urgency, nocturia, hesitancy, discharge, hematuria or flank pain Musculoskeletal: Denies arthralgia, myalgia, stiffness, Jt. Swelling, pain, limp or strain/sprain. Denies Falls. Skin: Denies puritis, rash, hives, warts, acne, eczema or change in skin lesion Neuro: No weakness, tremor, incoordination, spasms, paresthesia or pain Psychiatric: Denies confusion, memory loss or sensory loss. Denies Depression. Endocrine: Denies change in weight, skin, hair change, nocturia, and paresthesia, diabetic polys, visual blurring or hyper / hypo glycemic episodes.  Heme/Lymph: No excessive bleeding, bruising or enlarged lymph nodes.  Physical Exam  BP 130/82   Pulse 64   Temp 97.9 F (36.6 C)   Resp 16   Ht 6\' 3"  (1.905 m)   Wt 251 lb 3.2 oz (113.9 kg)   BMI 31.40 kg/m   General Appearance: Well nourished, in no apparent distress.  Eyes: PERRLA, EOMs, conjunctiva no swelling or erythema, normal fundi and vessels. Sinuses: No frontal/maxillary tenderness ENT/Mouth: EACs patent / TMs  nl. Nares clear without erythema, swelling, mucoid exudates. Oral hygiene is good. No erythema, swelling, or exudate. Tongue normal, non-obstructing. Tonsils not swollen or erythematous. Hearing normal.  Neck: Supple, thyroid normal. No bruits, nodes or JVD. Respiratory: Respiratory effort normal.  BS equal and clear bilateral without rales, rhonci, wheezing or stridor. Cardio: Heart sounds are normal with regular rate and rhythm and no murmurs, rubs or gallops. Peripheral pulses are normal and equal bilaterally without edema. No aortic or femoral bruits. Chest: symmetric with normal excursions and percussion.  Abdomen: Soft, with Nl bowel sounds. Nontender, no guarding, rebound, hernias, masses, or organomegaly.   Lymphatics: Non tender without lymphadenopathy.  Genitourinary: No hernias.Testes nl. DRE - deferred for age Musculoskeletal: Full ROM all peripheral extremities, joint stability, 5/5 strength, and normal gait. Skin: Warm and dry without rashes, lesions, cyanosis, clubbing or  ecchymosis.  Neuro: Cranial nerves intact, reflexes equal bilaterally. Normal muscle tone, no cerebellar symptoms. Sensation intact.  Pysch: Alert and oriented X 3 with normal affect, insight and judgment appropriate.   Assessment and Plan  1. Annual Preventative/Screening Exam   2. Labile hypertension  - Microalbumin / creatinine urine ratio - Urinalysis, Routine w reflex microscopic - CBC with Differential/Platelet - BASIC METABOLIC PANEL WITH GFR - Magnesium - TSH  3. Mixed hyperlipidemia  - Hepatic function panel - Lipid panel - TSH  4. Screening for diabetes mellitus  - Hemoglobin A1c - Insulin, random  5. Vitamin D deficiency  - recc take Vit D 10,000 units/daily - VITAMIN D 25 Hydroxy   6. Postablative hypothyroidism   7. Screening examination for pulmonary tuberculosis   8. Screening for rectal cancer  - POC Hemoccult Bld/Stl   9. Other fatigue  - Vitamin B12 - Iron and TIBC - Testosterone - CBC with Differential/Platelet  10. Medication management  - Urinalysis, Routine w reflex microscopic - CBC with Differential/Platelet -  BASIC METABOLIC PANEL WITH GFR - Hepatic function panel - Magnesium - Lipid panel - TSH - Hemoglobin A1c - Insulin, random - VITAMIN D 25 Hydroxy       Continue prudent diet as discussed, weight control, BP monitoring, regular exercise, and medications as discussed.  Long discussion re: future goal for employment only be benefited by more education and encouraged him to return to [employer] and request Guidance counseling for career goals. Continue 3 month f/u to maintain med compliance in the socially immature individual. Discussed med effects and  SE's. Routine screening labs and tests as requested with regular follow-up as recommended. Over 40 minutes of exam, counseling, chart review and high complex critical decision making was performed

## 2016-12-28 ENCOUNTER — Other Ambulatory Visit: Payer: Self-pay | Admitting: Internal Medicine

## 2016-12-28 DIAGNOSIS — R7989 Other specified abnormal findings of blood chemistry: Secondary | ICD-10-CM

## 2016-12-28 DIAGNOSIS — R945 Abnormal results of liver function studies: Secondary | ICD-10-CM

## 2016-12-28 LAB — URINALYSIS, ROUTINE W REFLEX MICROSCOPIC
BILIRUBIN URINE: NEGATIVE
Glucose, UA: NEGATIVE
HGB URINE DIPSTICK: NEGATIVE
KETONES UR: NEGATIVE
Leukocytes, UA: NEGATIVE
NITRITE: NEGATIVE
PH: 5.5 (ref 5.0–8.0)
SPECIFIC GRAVITY, URINE: 1.029 (ref 1.001–1.035)

## 2016-12-28 LAB — VITAMIN D 25 HYDROXY (VIT D DEFICIENCY, FRACTURES): VIT D 25 HYDROXY: 57 ng/mL (ref 30–100)

## 2016-12-28 LAB — URINALYSIS, MICROSCOPIC ONLY
Bacteria, UA: NONE SEEN [HPF]
CRYSTALS: NONE SEEN [HPF]
Casts: NONE SEEN [LPF]
Squamous Epithelial / LPF: NONE SEEN [HPF] (ref <=5)
Yeast: NONE SEEN [HPF]

## 2016-12-28 LAB — HEMOGLOBIN A1C
Hgb A1c MFr Bld: 5.5 % (ref <5.7)
Mean Plasma Glucose: 111 mg/dL

## 2016-12-28 LAB — MICROALBUMIN / CREATININE URINE RATIO
CREATININE, URINE: 359 mg/dL (ref 20–370)
MICROALB UR: 0.8 mg/dL
Microalb Creat Ratio: 2 mcg/mg creat (ref <30)

## 2016-12-28 LAB — INSULIN, RANDOM: Insulin: 36.8 u[IU]/mL — ABNORMAL HIGH (ref 2.0–19.6)

## 2016-12-28 LAB — TESTOSTERONE: TESTOSTERONE: 608 ng/dL (ref 250–827)

## 2016-12-29 ENCOUNTER — Telehealth: Payer: Self-pay | Admitting: *Deleted

## 2016-12-29 NOTE — Telephone Encounter (Signed)
Patient called with questions in regard to his lab results.  He was concerned about his liver enzymes being elevated.  I explained there could be different causes and encouraged him to schedule a lab only appointment for more labs. Lab only appointment is scheduled for 03/19/02018.

## 2017-01-01 ENCOUNTER — Other Ambulatory Visit: Payer: 59

## 2017-01-01 DIAGNOSIS — R945 Abnormal results of liver function studies: Secondary | ICD-10-CM

## 2017-01-01 DIAGNOSIS — R7989 Other specified abnormal findings of blood chemistry: Secondary | ICD-10-CM

## 2017-01-01 LAB — HEPATITIS A ANTIBODY, TOTAL: Hep A Total Ab: NONREACTIVE

## 2017-01-01 LAB — GAMMA GT: GGT: 964 U/L — ABNORMAL HIGH (ref 7–51)

## 2017-01-01 LAB — PROTIME-INR
INR: 1
Prothrombin Time: 10.8 s (ref 9.0–11.5)

## 2017-01-01 LAB — HEPATITIS B CORE ANTIBODY, TOTAL: Hep B Core Total Ab: NONREACTIVE

## 2017-01-01 LAB — HEPATITIS B SURFACE ANTIBODY,QUALITATIVE: HEP B S AB: NEGATIVE

## 2017-01-01 LAB — HEPATITIS C ANTIBODY: HCV AB: NEGATIVE

## 2017-01-03 ENCOUNTER — Emergency Department (HOSPITAL_BASED_OUTPATIENT_CLINIC_OR_DEPARTMENT_OTHER)
Admission: EM | Admit: 2017-01-03 | Discharge: 2017-01-03 | Disposition: A | Discharge status: Home / Self Care | Payer: 59 | Attending: Emergency Medicine | Admitting: Emergency Medicine

## 2017-01-03 ENCOUNTER — Telehealth: Payer: Self-pay | Admitting: *Deleted

## 2017-01-03 ENCOUNTER — Encounter (HOSPITAL_BASED_OUTPATIENT_CLINIC_OR_DEPARTMENT_OTHER): Payer: Self-pay

## 2017-01-03 ENCOUNTER — Encounter: Payer: Self-pay | Admitting: Gastroenterology

## 2017-01-03 ENCOUNTER — Other Ambulatory Visit: Payer: Self-pay | Admitting: Internal Medicine

## 2017-01-03 DIAGNOSIS — R110 Nausea: Secondary | ICD-10-CM

## 2017-01-03 DIAGNOSIS — E039 Hypothyroidism, unspecified: Secondary | ICD-10-CM | POA: Diagnosis not present

## 2017-01-03 DIAGNOSIS — R945 Abnormal results of liver function studies: Secondary | ICD-10-CM | POA: Insufficient documentation

## 2017-01-03 DIAGNOSIS — R112 Nausea with vomiting, unspecified: Secondary | ICD-10-CM | POA: Insufficient documentation

## 2017-01-03 DIAGNOSIS — R7989 Other specified abnormal findings of blood chemistry: Secondary | ICD-10-CM

## 2017-01-03 LAB — HEPATITIS B E ANTIBODY: Hepatitis Be Antibody: NONREACTIVE

## 2017-01-03 MED ORDER — ONDANSETRON HCL 4 MG PO TABS: 4.0000 mg | ORAL_TABLET | Freq: Four times a day (QID) | ORAL | 0 refills | Status: DC

## 2017-01-03 MED ORDER — ONDANSETRON 4 MG PO TBDP
ORAL_TABLET | ORAL | Status: AC
  Administered 2017-01-03: 4 mg
  Filled 2017-01-03: qty 1

## 2017-01-03 MED ORDER — ONDANSETRON HCL 8 MG PO TABS: 4.0000 mg | ORAL_TABLET | Freq: Once | ORAL | Status: DC

## 2017-01-03 NOTE — ED Notes (Signed)
ED Provider at bedside. 

## 2017-01-03 NOTE — Telephone Encounter (Signed)
Patient called and asked about his recent labs showing his liver enzymes have continued to be elevated.  He was advised that Dr Melford Aase has requested a referral to a GI doctor for further testing on his liver to determine the cause.

## 2017-01-03 NOTE — Discharge Instructions (Signed)
Medications: Zofran  Treatment: Take Zofran every 6 hours as needed for nausea and vomiting. You can split your Zantac and take 1 in the morning and one at night, 30 minutes prior to eating.  Follow-up: Please follow-up with your primary care provider as well as a gastroenterologist for further evaluation and treatment of your symptoms. Please return to the emergency department to develop any new or worsening symptoms including fevers, severe abdominal pain, intractable vomiting, or any other concerning symptoms.

## 2017-01-03 NOTE — ED Provider Notes (Signed)
[facility] Provider Note   CSN: [id number] Arrival date & time: 01/03/17  2002   By signing my name below, I, Delton Prairie, attest that this documentation has been prepared under the direction and in the presence of   Bryndan Bilyk,PA-C. Electronically Signed: Delton Prairie, ED Scribe. 01/03/17. 10:17 PM.   History   Chief Complaint Chief Complaint  Patient presents with  . Elevated Hepatic Enzymes   The history is provided by the patient. No language interpreter was used.   HPI Comments:  Kyle Gonzales is a 26 y.o. male, with a PMHx of GERD, hypothyroidism and graves disease, who presents to the Emergency Department complaining of acute onset nausea secondary to being anxious x today. Pt states his PCP recently told him his liver enzyme was elevated. He reports 1 episode of nausea and vomiting today after reading online about the possible causes of his elevated LFTS and GGT. Over the past few months, patient has had intermittent loose stools and intermittent abdominal discomfort which he describes as an ache, which is chronic. He denies any abdominal pain or aching at this time. No alleviating factors noted. Pt denies diarrhea, constipation, any urinary symptoms, chest pain, SOB, back pain or any other associated symptoms. He also denies alcohol use. No other complaints noted. Pt has an appointment schedule with a gastroenterologist on 02/12/2017, however patient was hoping to have answers to why his LFTs are elevated before then.  Past Medical History:  Diagnosis Date  . Graves disease 08/18/2015    Patient Active Problem List   Diagnosis Date Noted  . Hyperlipidemia 01/11/2016  . Medication management 01/11/2016  . Palpitations 12/04/2015  . Hypothyroidism 12/04/2015  . Graves disease 08/18/2015  . Vitamin D deficiency 08/11/2015    Past Surgical History:  Procedure Laterality Date  . ANTERIOR CRUCIATE LIGAMENT REPAIR  2012    Home Medications    Prior to  Admission medications   Medication Sig Start Date End Date Taking? Authorizing Provider  ALPRAZolam Duanne Moron) 0.5 MG tablet Take 1/2 to 1 tablet 3 times a day, only if needed for anxiety. 01/04/17   [name], MD  bisoprolol-hydrochlorothiazide [facility]) 10-6.25 MG tablet Take 1 tablet by mouth daily. 10/02/16   [name], MD  citalopram (CELEXA) 40 MG tablet Take 1 tablet daily for OCD 12/27/16 06/29/17  [name], MD  fluticasone [facility]) 50 MCG/ACT nasal spray instill 2 sprays into each nostril once daily 05/13/16   [name], MD  levothyroxine (SYNTHROID, LEVOTHROID) 75 MCG tablet take 1 tablet by mouth every morning ON AN EMPTY STOMACH 10/25/16   [name], MD  ondansetron (ZOFRAN) 4 MG tablet Take 1 tablet (4 mg total) by mouth every 6 (six) hours. 01/03/17   Frederica Kuster, PA-C  ranitidine (ZANTAC) 300 MG tablet Take 1 tablet as needed for heartburn.  Do not exceed 2 tablets in 24 hours. 04/12/16 04/12/17  Courtney Forcucci, PA-C  traZODone (DESYREL) 50 MG tablet take 1/2 to 2 tablets by mouth at bedtime if needed for sleep 01/04/17   [name], MD    Family History No family history on file.  Social History Social History  Substance Use Topics  . Smoking status: Never Smoker  . Smokeless tobacco: Never Used     Comment: Marijuana  . Alcohol use No     Allergies   Tylenol [acetaminophen]   Review of Systems Review of Systems  Constitutional: Negative for chills and fever.  HENT: Negative for facial swelling and sore  throat.   Respiratory: Negative for shortness of breath.   Cardiovascular: Negative for chest pain.  Gastrointestinal: Positive for nausea and vomiting. Negative for abdominal pain.  Genitourinary: Negative for dysuria.  Musculoskeletal: Negative for back pain.  Skin: Negative for rash and wound.  Neurological: Negative for headaches.  Psychiatric/Behavioral: The patient is not nervous/anxious.      Physical Exam Updated Vital  Signs BP 138/74 (BP Location: Right Arm)   Pulse 74   Temp 99 F (37.2 C)   Resp 18   Ht 6\' 3"  (1.905 m)   Wt 113.4 kg   SpO2 98%   BMI 31.25 kg/m   Physical Exam  Constitutional: He appears well-developed and well-nourished. No distress.  HENT:  Head: Normocephalic and atraumatic.  Mouth/Throat: Oropharynx is clear and moist. No oropharyngeal exudate.  Eyes: Conjunctivae are normal. Pupils are equal, round, and reactive to light. Right eye exhibits no discharge. Left eye exhibits no discharge. No scleral icterus.  Neck: Normal range of motion. Neck supple. No thyromegaly present.  Cardiovascular: Normal rate, regular rhythm, normal heart sounds and intact distal pulses.  Exam reveals no gallop and no friction rub.   No murmur heard. Pulmonary/Chest: Effort normal and breath sounds normal. No stridor. No respiratory distress. He has no wheezes. He has no rales.  Abdominal: Soft. Bowel sounds are normal. He exhibits no distension. There is no tenderness. There is no rebound and no guarding.  Musculoskeletal: He exhibits no edema.  Lymphadenopathy:    He has no cervical adenopathy.  Neurological: He is alert. Coordination normal.  Skin: Skin is warm and dry. No rash noted. He is not diaphoretic. No pallor.  Psychiatric: He has a normal mood and affect.  Nursing note and vitals reviewed.  ED Treatments / Results  DIAGNOSTIC STUDIES:  Oxygen Saturation is 100% on RA, normal by my interpretation.    COORDINATION OF CARE:  10:17 PM Discussed treatment plan with pt at bedside and pt agreed to plan.  Labs (all labs ordered are listed, but only abnormal results are displayed) Labs Reviewed - No data to display  EKG  EKG Interpretation None       Radiology No results found.  Procedures Procedures (including critical care time)  Medications Ordered in ED Medications  ondansetron (ZOFRAN-ODT) 4 MG disintegrating tablet (4 mg  Given 01/03/17 2341)     Initial  Impression / Assessment and Plan / ED Course  I have reviewed the triage vital signs and the nursing notes.  Pertinent labs & imaging results that were available during my care of the patient were reviewed by me and considered in my medical decision making (see chart for details).     Patient presenting with his mother after being told he has an elevated GGT level and referred to GI. Patient has had his LFTs rechecked every 6 months over the past 1-2 years because of elevation. Most recently, AST 71, ALT 122 on 12/27/2016 and GGT 964 on 01/01/2017. Patient was referred to gastroenterology by PCP. He has an appointment on 02/12/2017. Patient is very anxious about what could be causing his elevations. Abdominal exam is benign, nontender. No indication for emergent imaging at this time. Patient without pain at the time. Patient reported nausea prior to discharge and given Zofran with relief. I feel there may be an anxiety component and I tried to encourage patient not to stress and read too much online about his situation and to wait and see what GI advises. Discharge home with  Zofran. Patient advised follow-up with PCP and gastroenterology as recommended. Strict return precautions given. Patient and mother understand and agree with plan. Patient vitals stable throughout ED course and discharged in satisfactory condition. I discussed patient case with Dr. Johnney Killian who guided the patient's management and agrees with plan.  Final Clinical Impressions(s) / ED Diagnoses   Final diagnoses:  Elevated LFTs  Nausea    New Prescriptions Discharge Medication List as of 01/03/2017 11:32 PM    START taking these medications   Details  ondansetron (ZOFRAN) 4 MG tablet Take 1 tablet (4 mg total) by mouth every 6 (six) hours., Starting Wed 01/03/2017, Print      I personally performed the services described in this documentation, which was scribed in my presence. The recorded information has been reviewed and is  accurate.     Frederica Kuster, PA-C 01/04/17 1519    Charlesetta Shanks, MD 01/13/17 [phone]

## 2017-01-03 NOTE — ED Triage Notes (Addendum)
c/o pt nausea-was made aware of elevated LFTs by PCP-pt is anxious about findings-pt has been having repeat LFTs every 23mos "for a while"-note reads plan is for GI referral-pt ND-steady gait

## 2017-01-04 ENCOUNTER — Emergency Department (HOSPITAL_BASED_OUTPATIENT_CLINIC_OR_DEPARTMENT_OTHER)
Admission: EM | Admit: 2017-01-04 | Discharge: 2017-01-04 | Disposition: A | Discharge status: Home / Self Care | Payer: 59 | Attending: Emergency Medicine | Admitting: Emergency Medicine

## 2017-01-04 ENCOUNTER — Telehealth: Payer: Self-pay | Admitting: Internal Medicine

## 2017-01-04 ENCOUNTER — Encounter (HOSPITAL_BASED_OUTPATIENT_CLINIC_OR_DEPARTMENT_OTHER): Payer: Self-pay | Admitting: Emergency Medicine

## 2017-01-04 ENCOUNTER — Other Ambulatory Visit: Payer: Self-pay | Admitting: *Deleted

## 2017-01-04 ENCOUNTER — Other Ambulatory Visit: Payer: Self-pay | Admitting: Physician Assistant

## 2017-01-04 DIAGNOSIS — Z79899 Other long term (current) drug therapy: Secondary | ICD-10-CM | POA: Insufficient documentation

## 2017-01-04 DIAGNOSIS — R112 Nausea with vomiting, unspecified: Secondary | ICD-10-CM | POA: Insufficient documentation

## 2017-01-04 DIAGNOSIS — R740 Nonspecific elevation of levels of transaminase and lactic acid dehydrogenase [LDH]: Secondary | ICD-10-CM | POA: Diagnosis not present

## 2017-01-04 DIAGNOSIS — E039 Hypothyroidism, unspecified: Secondary | ICD-10-CM | POA: Diagnosis not present

## 2017-01-04 DIAGNOSIS — Z5181 Encounter for therapeutic drug level monitoring: Secondary | ICD-10-CM | POA: Diagnosis not present

## 2017-01-04 DIAGNOSIS — R7401 Elevation of levels of liver transaminase levels: Secondary | ICD-10-CM

## 2017-01-04 HISTORY — DX: Palpitations: R00.2

## 2017-01-04 HISTORY — DX: Hyperlipidemia, unspecified: E78.5

## 2017-01-04 HISTORY — DX: Vitamin D deficiency, unspecified: E55.9

## 2017-01-04 LAB — COMPREHENSIVE METABOLIC PANEL
ALT: 366 U/L — AB (ref 17–63)
AST: 124 U/L — ABNORMAL HIGH (ref 15–41)
Albumin: 5.1 g/dL — ABNORMAL HIGH (ref 3.5–5.0)
Alkaline Phosphatase: 98 U/L (ref 38–126)
Anion gap: 15 (ref 5–15)
BUN: 13 mg/dL (ref 6–20)
CHLORIDE: 103 mmol/L (ref 101–111)
CO2: 19 mmol/L — ABNORMAL LOW (ref 22–32)
CREATININE: 1.16 mg/dL (ref 0.61–1.24)
Calcium: 10.1 mg/dL (ref 8.9–10.3)
Glucose, Bld: 123 mg/dL — ABNORMAL HIGH (ref 65–99)
Potassium: 3.2 mmol/L — ABNORMAL LOW (ref 3.5–5.1)
Sodium: 137 mmol/L (ref 135–145)
Total Bilirubin: 1.6 mg/dL — ABNORMAL HIGH (ref 0.3–1.2)
Total Protein: 8.6 g/dL — ABNORMAL HIGH (ref 6.5–8.1)

## 2017-01-04 LAB — CBC WITH DIFFERENTIAL/PLATELET
Basophils Absolute: 0 10*3/uL (ref 0.0–0.1)
Basophils Relative: 0 %
EOS PCT: 0 %
Eosinophils Absolute: 0 10*3/uL (ref 0.0–0.7)
HCT: 42.8 % (ref 39.0–52.0)
Hemoglobin: 14.9 g/dL (ref 13.0–17.0)
LYMPHS ABS: 1.2 10*3/uL (ref 0.7–4.0)
LYMPHS PCT: 13 %
MCH: 30 pg (ref 26.0–34.0)
MCHC: 34.8 g/dL (ref 30.0–36.0)
MCV: 86.1 fL (ref 78.0–100.0)
MONO ABS: 0.6 10*3/uL (ref 0.1–1.0)
MONOS PCT: 7 %
Neutro Abs: 7 10*3/uL (ref 1.7–7.7)
Neutrophils Relative %: 80 %
PLATELETS: 156 10*3/uL (ref 150–400)
RBC: 4.97 MIL/uL (ref 4.22–5.81)
RDW: 14.3 % (ref 11.5–15.5)
WBC: 8.8 10*3/uL (ref 4.0–10.5)

## 2017-01-04 LAB — PROTIME-INR
INR: 1.03
PROTHROMBIN TIME: 13.5 s (ref 11.4–15.2)

## 2017-01-04 LAB — LIPASE, BLOOD: LIPASE: 23 U/L (ref 11–51)

## 2017-01-04 MED ORDER — METOCLOPRAMIDE HCL 5 MG/ML IJ SOLN
10.0000 mg | Freq: Once | INTRAMUSCULAR | Status: AC
  Administered 2017-01-04: 10 mg via INTRAVENOUS
  Filled 2017-01-04: qty 2

## 2017-01-04 MED ORDER — ALPRAZOLAM 0.5 MG PO TABS: ORAL_TABLET | ORAL | 0 refills | Status: DC

## 2017-01-04 MED ORDER — SODIUM CHLORIDE 0.9 % IV BOLUS (SEPSIS)
1000.0000 mL | Freq: Once | INTRAVENOUS | Status: AC
  Administered 2017-01-04: 1000 mL via INTRAVENOUS

## 2017-01-04 MED ORDER — ONDANSETRON HCL 4 MG/2ML IJ SOLN
4.0000 mg | Freq: Once | INTRAMUSCULAR | Status: AC
  Administered 2017-01-04: 4 mg via INTRAVENOUS
  Filled 2017-01-04: qty 2

## 2017-01-04 MED ORDER — TRAZODONE HCL 50 MG PO TABS: ORAL_TABLET | ORAL | 0 refills | Status: DC

## 2017-01-04 MED ORDER — DICYCLOMINE HCL 10 MG PO CAPS
20.0000 mg | ORAL_CAPSULE | Freq: Once | ORAL | Status: AC
  Administered 2017-01-04: 20 mg via ORAL
  Filled 2017-01-04: qty 2

## 2017-01-04 NOTE — Telephone Encounter (Signed)
Called to check on patient and confirmed [facility] referral appointment. Patient's mother aware of GI appointment. 01-09-17 @ 9:30am with [facility]. She states patient is very anxious and worked up, is there anything he can take short term to relax him. She thinks the anxiety is making his symptoms worse. [email or web address]

## 2017-01-04 NOTE — ED Notes (Signed)
Patient reports that he is having some numbness to his hands. Patient made aware that the cause could be that he is hyperventilation

## 2017-01-04 NOTE — Telephone Encounter (Signed)
Patient called and stated he is still nausea. Not able to eat. He is drinking fluids to avoid dehydration.  Advised patient that Zofran was rx'd and  is for nausea every 6hrs. Continue following orders from ED to take 1/2 Zantac BID. Tums prn.  Recommended bland diet, grits, toast, saltines, popicles, jello, ginger ale. Also, Rx was called in for anxiety and renewal of trazadone per patient request for sleep.Patient Requested to be seen Friday, 01-05-17 with [facility], gave patient phone number and encouraged him to check and see if they had any cancellations.

## 2017-01-04 NOTE — ED Triage Notes (Signed)
Patient was here about 3 hours ago. The patient reports that he has had not stopped vomiting since he left here

## 2017-01-04 NOTE — ED Provider Notes (Signed)
[facility] DEPT [facility] Provider Note   CSN: [id number] Arrival date & time: 01/04/17  0142     History   Chief Complaint Chief Complaint  Patient presents with  . Emesis    HPI Kyle Gonzales is a 26 y.o. male.Presenting today with nausea vomiting and stomach upset. Patient states for the past year he has had transaminitis has been evaluated by his primary care physician. Recently his level was not 140 which is down from previous levels. They do not know what is causing it to the septum to a gastroenterologist for follow-up. He presented tonight because he continues to have nausea vomiting and stomach upset. His appetite has been decreased. This is been going on for 2 days. He was seen here earlier and discharged home with Zofran without relief. Patient is concerned something serious is now going on. He denies fevers or diarrhea. He's had a hepatitis panel which was negative. He does not recall having any ultrasounds or CT scans performed on his liver. There are no further complaints.  10 Systems reviewed and are negative for acute change except as noted in the HPI.    HPI  Past Medical History:  Diagnosis Date  . Graves disease 08/18/2015    Patient Active Problem List   Diagnosis Date Noted  . Hyperlipidemia 01/11/2016  . Medication management 01/11/2016  . Palpitations 12/04/2015  . Hypothyroidism 12/04/2015  . Graves disease 08/18/2015  . Vitamin D deficiency 08/11/2015    Past Surgical History:  Procedure Laterality Date  . ANTERIOR CRUCIATE LIGAMENT REPAIR  2012       Home Medications    Prior to Admission medications   Medication Sig Start Date End Date Taking? Authorizing Provider  bisoprolol-hydrochlorothiazide (ZIAC) 10-6.25 MG tablet Take 1 tablet by mouth daily. 10/02/16   [name], MD  citalopram (CELEXA) 40 MG tablet Take 1 tablet daily for OCD 12/27/16 06/29/17  [name], MD  fluticasone [facility]) 50 MCG/ACT nasal spray instill 2 sprays  into each nostril once daily 05/13/16   [name], MD  levothyroxine (SYNTHROID, LEVOTHROID) 75 MCG tablet take 1 tablet by mouth every morning ON AN EMPTY STOMACH 10/25/16   [name], MD  ondansetron (ZOFRAN) 4 MG tablet Take 1 tablet (4 mg total) by mouth every 6 (six) hours. 01/03/17   Frederica Kuster, PA-C  ranitidine (ZANTAC) 300 MG tablet Take 1 tablet as needed for heartburn.  Do not exceed 2 tablets in 24 hours. 04/12/16 04/12/17  Courtney Forcucci, PA-C  traZODone (DESYREL) 50 MG tablet take 1/2 to 2 tablets by mouth at bedtime if needed for sleep 08/02/16   Vicie Mutters, PA-C    Family History History reviewed. No pertinent family history.  Social History Social History  Substance Use Topics  . Smoking status: Never Smoker  . Smokeless tobacco: Never Used     Comment: Marijuana  . Alcohol use No     Allergies   Tylenol [acetaminophen]   Review of Systems Review of Systems   Physical Exam Updated Vital Signs BP (!) 152/88 (BP Location: Right Arm)   Pulse 91   Temp 98.7 F (37.1 C) (Oral)   Resp (!) 26   Ht 6\' 3"  (1.905 m)   Wt 250 lb (113.4 kg)   SpO2 100%   BMI 31.25 kg/m   Physical Exam  Constitutional: He is oriented to person, place, and time. Vital signs are normal. He appears well-developed and well-nourished.  Non-toxic appearance. He does not appear ill. No distress.  HENT:  Head: Normocephalic and atraumatic.  Nose: Nose normal.  Mouth/Throat: Oropharynx is clear and moist. No oropharyngeal exudate.  Eyes: Conjunctivae and EOM are normal. Pupils are equal, round, and reactive to light. No scleral icterus.  Neck: Normal range of motion. Neck supple. No tracheal deviation, no edema, no erythema and normal range of motion present. No thyroid mass and no thyromegaly present.  Cardiovascular: Normal rate, regular rhythm, S1 normal, S2 normal, normal heart sounds, intact distal pulses and normal pulses.  Exam reveals no gallop and no friction  rub.   No murmur heard. Pulmonary/Chest: Effort normal and breath sounds normal. No respiratory distress. He has no wheezes. He has no rhonchi. He has no rales.  Abdominal: Soft. Normal appearance and bowel sounds are normal. He exhibits no distension, no ascites and no mass. There is no hepatosplenomegaly. There is no tenderness. There is no rebound, no guarding and no CVA tenderness.  Musculoskeletal: Normal range of motion. He exhibits no edema or tenderness.  Lymphadenopathy:    He has no cervical adenopathy.  Neurological: He is alert and oriented to person, place, and time. He has normal strength. No cranial nerve deficit or sensory deficit.  Skin: Skin is warm, dry and intact. No petechiae and no rash noted. He is not diaphoretic. No erythema. No pallor.  Nursing note and vitals reviewed.    ED Treatments / Results  Labs (all labs ordered are listed, but only abnormal results are displayed) Labs Reviewed  COMPREHENSIVE METABOLIC PANEL - Abnormal; Notable for the following:       Result Value   Potassium 3.2 (*)    CO2 19 (*)    Glucose, Bld 123 (*)    Total Protein 8.6 (*)    Albumin 5.1 (*)    AST 124 (*)    ALT 366 (*)    Total Bilirubin 1.6 (*)    All other components within normal limits  CBC WITH DIFFERENTIAL/PLATELET  LIPASE, BLOOD  PROTIME-INR    EKG  EKG Interpretation None       Radiology No results found.  Procedures Procedures (including critical care time)  Medications Ordered in ED Medications  sodium chloride 0.9 % bolus 1,000 mL (1,000 mLs Intravenous New Bag/Given 01/04/17 0234)  ondansetron (ZOFRAN) injection 4 mg (4 mg Intravenous Given 01/04/17 0234)  metoCLOPramide (REGLAN) injection 10 mg (10 mg Intravenous Given 01/04/17 0234)  dicyclomine (BENTYL) capsule 20 mg (20 mg Oral Given 01/04/17 0234)     Initial Impression / Assessment and Plan / ED Course  I have reviewed the triage vital signs and the nursing notes.  Pertinent labs &  imaging results that were available during my care of the patient were reviewed by me and considered in my medical decision making (see chart for details).     Patient presents emergency department for nausea and vomiting, possible liver trouble. He is bounced back tonight so we'll obtain laboratory studies and treatment with IV fluids, Zofran, Reglan, Bentyl. I anticipate his liver enzymes will be up to have been for the past year. I do not believe the patient requires emergent imaging. We'll reassess for symptomatic improvement.  3:22 AM Upon repeat evaluation, patient states he feels much better. He has zofran rx from last visit to use as needed.  GI fu advised as provided by his PCP.  LFTs are improved from what he said prior.  Bili is up at 1.6 and past US reveal cholelithiasis. This may also be drom mild dehydration. Patient  informed of this.  He appears well and in NAD. VS remain within his normal limits and he is safe for DC.  Final Clinical Impressions(s) / ED Diagnoses   Final diagnoses:  Transaminitis  Non-intractable vomiting with nausea, unspecified vomiting type    New Prescriptions New Prescriptions   No medications on file     Everlene Balls, MD 01/04/17 [phone]

## 2017-01-04 NOTE — ED Notes (Signed)
Patient is sleeping at this time.

## 2017-01-06 ENCOUNTER — Encounter (HOSPITAL_COMMUNITY): Payer: Self-pay

## 2017-01-06 ENCOUNTER — Emergency Department (HOSPITAL_COMMUNITY)
Admission: EM | Admit: 2017-01-06 | Discharge: 2017-01-06 | Disposition: A | Discharge status: Home / Self Care | Payer: 59 | Attending: Emergency Medicine | Admitting: Emergency Medicine

## 2017-01-06 ENCOUNTER — Other Ambulatory Visit: Payer: Self-pay | Admitting: Internal Medicine

## 2017-01-06 DIAGNOSIS — E039 Hypothyroidism, unspecified: Secondary | ICD-10-CM | POA: Diagnosis not present

## 2017-01-06 DIAGNOSIS — R109 Unspecified abdominal pain: Secondary | ICD-10-CM | POA: Diagnosis present

## 2017-01-06 DIAGNOSIS — F1920 Other psychoactive substance dependence, uncomplicated: Secondary | ICD-10-CM | POA: Insufficient documentation

## 2017-01-06 DIAGNOSIS — R112 Nausea with vomiting, unspecified: Secondary | ICD-10-CM | POA: Diagnosis not present

## 2017-01-06 DIAGNOSIS — R1033 Periumbilical pain: Secondary | ICD-10-CM | POA: Diagnosis not present

## 2017-01-06 LAB — URINALYSIS, ROUTINE W REFLEX MICROSCOPIC
Bilirubin Urine: NEGATIVE
GLUCOSE, UA: NEGATIVE mg/dL
HGB URINE DIPSTICK: NEGATIVE
KETONES UR: 80 mg/dL — AB
Leukocytes, UA: NEGATIVE
Nitrite: NEGATIVE
PH: 6 (ref 5.0–8.0)
Protein, ur: NEGATIVE mg/dL
SPECIFIC GRAVITY, URINE: 1.029 (ref 1.005–1.030)

## 2017-01-06 LAB — CBC WITH DIFFERENTIAL/PLATELET
BASOS PCT: 0 %
Basophils Absolute: 0 10*3/uL (ref 0.0–0.1)
EOS ABS: 0 10*3/uL (ref 0.0–0.7)
Eosinophils Relative: 0 %
HEMATOCRIT: 41.4 % (ref 39.0–52.0)
HEMOGLOBIN: 14.3 g/dL (ref 13.0–17.0)
LYMPHS ABS: 1.4 10*3/uL (ref 0.7–4.0)
Lymphocytes Relative: 13 %
MCH: 29.7 pg (ref 26.0–34.0)
MCHC: 34.5 g/dL (ref 30.0–36.0)
MCV: 85.9 fL (ref 78.0–100.0)
MONOS PCT: 7 %
Monocytes Absolute: 0.7 10*3/uL (ref 0.1–1.0)
NEUTROS ABS: 8.4 10*3/uL — AB (ref 1.7–7.7)
NEUTROS PCT: 80 %
Platelets: 147 10*3/uL — ABNORMAL LOW (ref 150–400)
RBC: 4.82 MIL/uL (ref 4.22–5.81)
RDW: 14.1 % (ref 11.5–15.5)
WBC: 10.6 10*3/uL — AB (ref 4.0–10.5)

## 2017-01-06 LAB — COMPREHENSIVE METABOLIC PANEL WITH GFR
ALT: 166 U/L — ABNORMAL HIGH (ref 17–63)
AST: 41 U/L (ref 15–41)
Albumin: 5 g/dL (ref 3.5–5.0)
Alkaline Phosphatase: 81 U/L (ref 38–126)
Anion gap: 11 (ref 5–15)
BUN: 13 mg/dL (ref 6–20)
CO2: 24 mmol/L (ref 22–32)
Calcium: 10.1 mg/dL (ref 8.9–10.3)
Chloride: 100 mmol/L — ABNORMAL LOW (ref 101–111)
Creatinine, Ser: 1.19 mg/dL (ref 0.61–1.24)
GFR calc Af Amer: >60 mL/min (ref >60)
GFR calc non Af Amer: >60 mL/min (ref >60)
Glucose, Bld: 104 mg/dL — ABNORMAL HIGH (ref 65–99)
Potassium: 3.6 mmol/L (ref 3.5–5.1)
Sodium: 135 mmol/L (ref 135–145)
Total Bilirubin: 1.5 mg/dL — ABNORMAL HIGH (ref 0.3–1.2)
Total Protein: 8.6 g/dL — ABNORMAL HIGH (ref 6.5–8.1)

## 2017-01-06 LAB — LIPASE, BLOOD: Lipase: 22 U/L (ref 11–51)

## 2017-01-06 MED ORDER — HALOPERIDOL LACTATE 5 MG/ML IJ SOLN
2.0000 mg | Freq: Once | INTRAMUSCULAR | Status: AC
  Administered 2017-01-06: 2 mg via INTRAMUSCULAR
  Filled 2017-01-06: qty 1

## 2017-01-06 MED ORDER — LORAZEPAM 1 MG PO TABS
1.0000 mg | ORAL_TABLET | Freq: Once | ORAL | Status: AC
  Administered 2017-01-06: 1 mg via ORAL
  Filled 2017-01-06: qty 1

## 2017-01-06 MED ORDER — NAPROXEN 500 MG PO TABS
500.0000 mg | ORAL_TABLET | Freq: Once | ORAL | Status: AC
  Administered 2017-01-06: 500 mg via ORAL
  Filled 2017-01-06: qty 1

## 2017-01-06 MED ORDER — ONDANSETRON 8 MG PO TBDP
8.0000 mg | ORAL_TABLET | Freq: Once | ORAL | Status: AC
  Administered 2017-01-06: 8 mg via ORAL
  Filled 2017-01-06: qty 1

## 2017-01-06 NOTE — ED Triage Notes (Signed)
Patient c/o of abdominal pain that started over a 1 week ago. Pt state he make him self vomit because it make him feel better.

## 2017-01-06 NOTE — Discharge Instructions (Addendum)
Take Zofran as needed for nausea and vomiting Take Xanax for anxiety Take Aleve for pain Follow up with GI on Tuesday

## 2017-01-06 NOTE — ED Provider Notes (Signed)
[facility] DEPT Provider Note   CSN: [id number] Arrival date & time: 01/06/17  0940     History   Chief Complaint Chief Complaint  Patient presents with  . Abdominal Pain    HPI Kyle Gonzales is a 26 y.o. male who presents with abdominal pain. Past medical history significant for Graves' disease, hyperlipidemia, hypertension, elevated LFTs, anxiety. He states over the past week he has had intermittent nausea and vomiting. Yesterday he started to have centralized abdominal pain. It is intermittent, sharp, better with standing and walking, worse with lying down. He has made himself vomit twice because it makes him feel better. He endorses mild constipation. No fever, chills, chest pain, shortness of breath, diarrhea, hematochezia/melena, dysuria. He has been seen for this twice in the past week, given reassurance and Zofran. Yesterday he had a lot of nausea but this has gotten better. Today his main concern is abdominal pain. Has had negative Hep panel in 2016. LFTs are chronically elevated - were initially noted to be elevated in Oct of 2016 as well. Had Abdominal US which was remarkable for sludge but not cholecystitis. They have appt with GI on Tuesday.    HPI  Past Medical History:  Diagnosis Date  . Graves disease 08/18/2015  . Hyperlipidemia   . Hypothyroidism   . Palpitations   . Vitamin D deficiency     Patient Active Problem List   Diagnosis Date Noted  . Hyperlipidemia 01/11/2016  . Medication management 01/11/2016  . Palpitations 12/04/2015  . Hypothyroidism 12/04/2015  . Graves disease 08/18/2015  . Vitamin D deficiency 08/11/2015    Past Surgical History:  Procedure Laterality Date  . ANTERIOR CRUCIATE LIGAMENT REPAIR  2012       Home Medications    Prior to Admission medications   Medication Sig Start Date End Date Taking? Authorizing Provider  ALPRAZolam Duanne Moron) 0.5 MG tablet Take 1/2 to 1 tablet 3 times a day, only if needed for anxiety. 01/04/17    [name], MD  bisoprolol-hydrochlorothiazide [facility]) 10-6.25 MG tablet Take 1 tablet by mouth daily. 10/02/16   [name], MD  citalopram (CELEXA) 40 MG tablet Take 1 tablet daily for OCD 12/27/16 06/29/17  [name], MD  fluticasone [facility]) 50 MCG/ACT nasal spray instill 2 sprays into each nostril once daily 05/13/16   [name], MD  levothyroxine (SYNTHROID, LEVOTHROID) 75 MCG tablet take 1 tablet by mouth every morning ON AN EMPTY STOMACH 10/25/16   [name], MD  ondansetron (ZOFRAN) 4 MG tablet Take 1 tablet (4 mg total) by mouth every 6 (six) hours. 01/03/17   Frederica Kuster, PA-C  ranitidine (ZANTAC) 300 MG tablet Take 1 tablet as needed for heartburn.  Do not exceed 2 tablets in 24 hours. 04/12/16 04/12/17  Courtney Forcucci, PA-C  traZODone (DESYREL) 50 MG tablet take 1/2 to 2 tablets by mouth at bedtime if needed for sleep 01/04/17   [name], MD    Family History History reviewed. No pertinent family history.  Social History Social History  Substance Use Topics  . Smoking status: Never Smoker  . Smokeless tobacco: Never Used     Comment: Marijuana  . Alcohol use No     Allergies   Tylenol [acetaminophen]   Review of Systems Review of Systems  Constitutional: Negative for chills and fever.  Respiratory: Negative for shortness of breath.   Cardiovascular: Negative for chest pain.  Gastrointestinal: Positive for abdominal pain, constipation, nausea and vomiting. Negative for diarrhea.  Genitourinary: Negative  for dysuria.  All other systems reviewed and are negative.    Physical Exam Updated Vital Signs BP (!) 155/97   Pulse 62   Temp 98.1 F (36.7 C) (Oral)   Resp 15   Ht 6\' 3"  (1.905 m)   Wt 113.4 kg   SpO2 100%   BMI 31.25 kg/m   Physical Exam  Constitutional: He is oriented to person, place, and time. He appears well-developed and well-nourished. No distress.  HENT:  Head: Normocephalic and atraumatic.  Eyes:  Conjunctivae are normal. Pupils are equal, round, and reactive to light. Right eye exhibits no discharge. Left eye exhibits no discharge. No scleral icterus.  Neck: Normal range of motion.  Cardiovascular: Normal rate and regular rhythm.  Exam reveals no gallop and no friction rub.   No murmur heard. Pulmonary/Chest: Effort normal and breath sounds normal. No respiratory distress. He has no wheezes. He has no rales. He exhibits no tenderness.  Abdominal: Soft. Bowel sounds are normal. He exhibits no distension and no mass. There is no tenderness. There is no rebound and no guarding. No hernia.  Abdomen is non-tender but pt reports pain is in middle of abdomen when it does hurts  Neurological: He is alert and oriented to person, place, and time.  Skin: Skin is warm and dry.  Psychiatric: His behavior is normal. His mood appears anxious.  Nursing note and vitals reviewed.    ED Treatments / Results  Labs (all labs ordered are listed, but only abnormal results are displayed) Labs Reviewed  CBC WITH DIFFERENTIAL/PLATELET - Abnormal; Notable for the following:       Result Value   WBC 10.6 (*)    Platelets 147 (*)    Neutro Abs 8.4 (*)    All other components within normal limits  COMPREHENSIVE METABOLIC PANEL - Abnormal; Notable for the following:    Chloride 100 (*)    Glucose, Bld 104 (*)    Total Protein 8.6 (*)    ALT 166 (*)    Total Bilirubin 1.5 (*)    All other components within normal limits  URINALYSIS, ROUTINE W REFLEX MICROSCOPIC - Abnormal; Notable for the following:    Ketones, ur 80 (*)    All other components within normal limits  LIPASE, BLOOD    EKG  EKG Interpretation None       Radiology No results found.  Procedures Procedures (including critical care time)  Medications Ordered in ED Medications  ondansetron (ZOFRAN-ODT) disintegrating tablet 8 mg (8 mg Oral Given 01/06/17 1025)  naproxen (NAPROSYN) tablet 500 mg (500 mg Oral Given 01/06/17 1038)    LORazepam (ATIVAN) tablet 1 mg (1 mg Oral Given 01/06/17 1231)  haloperidol lactate (HALDOL) injection 2 mg (2 mg Intramuscular Given 01/06/17 1337)     Initial Impression / Assessment and Plan / ED Course  I have reviewed the triage vital signs and the nursing notes.  Pertinent labs & imaging results that were available during my care of the patient were reviewed by me and considered in my medical decision making (see chart for details).  26 year old male with abdominal pain, N/V. He is extremely anxious and I believe this plays a large part in his symptoms. Possibly also marijuana induced N/V. He is hypertensive - otherwise vitals are normal. Abdominal exam is benign - he denies tenderness.   On recheck, pain is better but nausea is worse. CBC remarkable for mild leukocytosis. CMP remarkable for improved LFTs. AST is normal and ALT  has gone down from 366 2 days ago to 166. UA has 80 ketones. No vomiting in the ED. He is still very upset about not getting imaging. I do not feel this is strongly indicated. Ativan ordered.  Shared visit with Dr. Wilson Singer who agrees pt does not need scan. IM Haldol ordered.  Pt requesting d/c.  He is tolerating PO. Safe for d/c  Final Clinical Impressions(s) / ED Diagnoses   Final diagnoses:  Periumbilical abdominal pain  Non-intractable vomiting with nausea, unspecified vomiting type    New Prescriptions New Prescriptions   No medications on file     Recardo Evangelist, PA-C 01/06/17 1541    Virgel Manifold, MD 01/07/17 [phone]

## 2017-01-09 ENCOUNTER — Encounter: Payer: Self-pay | Admitting: Physician Assistant

## 2017-01-09 ENCOUNTER — Other Ambulatory Visit (INDEPENDENT_AMBULATORY_CARE_PROVIDER_SITE_OTHER): Payer: 59

## 2017-01-09 ENCOUNTER — Ambulatory Visit (INDEPENDENT_AMBULATORY_CARE_PROVIDER_SITE_OTHER): Payer: 59 | Admitting: Physician Assistant

## 2017-01-09 VITALS — BP 110/80 | HR 80 | Ht 75.0 in | Wt 242.0 lb

## 2017-01-09 DIAGNOSIS — R7989 Other specified abnormal findings of blood chemistry: Secondary | ICD-10-CM | POA: Diagnosis not present

## 2017-01-09 DIAGNOSIS — R1013 Epigastric pain: Secondary | ICD-10-CM

## 2017-01-09 DIAGNOSIS — R110 Nausea: Secondary | ICD-10-CM

## 2017-01-09 DIAGNOSIS — R932 Abnormal findings on diagnostic imaging of liver and biliary tract: Secondary | ICD-10-CM

## 2017-01-09 DIAGNOSIS — R945 Abnormal results of liver function studies: Principal | ICD-10-CM

## 2017-01-09 LAB — SEDIMENTATION RATE: SED RATE: 18 mm/h — AB (ref 0–15)

## 2017-01-09 LAB — HIGH SENSITIVITY CRP: CRP, High Sensitivity: 25.86 mg/L — ABNORMAL HIGH (ref 0.000–5.000)

## 2017-01-09 LAB — PROTIME-INR
INR: 1.1 ratio — ABNORMAL HIGH (ref 0.8–1.0)
PROTHROMBIN TIME: 11.6 s (ref 9.6–13.1)

## 2017-01-09 MED ORDER — ONDANSETRON HCL 4 MG PO TABS: 4.0000 mg | ORAL_TABLET | Freq: Four times a day (QID) | ORAL | 1 refills | Status: DC

## 2017-01-09 MED ORDER — OMEPRAZOLE 40 MG PO CPDR: 40.0000 mg | DELAYED_RELEASE_CAPSULE | Freq: Every day | ORAL | 1 refills | Status: DC

## 2017-01-09 NOTE — Progress Notes (Signed)
Subjective:    Patient ID: Kyle Gonzales, male    DOB: [birth year], 26 y.o.   MRN: [id number]  HPI Kyle Gonzales is a pleasant 26 year old African-American male, new to GI today referred by Dr. [name] for evaluation of persistent daily elevated LFTs. Patient has had 3 recent ER visits due to complaints of nausea, upset stomach and anxiety over elevated LFTs. Patient has history of Graves' disease.  Reviewing his labs he has had a transaminitis present over the past 2 years, last set of normal LFTs was in February 2016. In June 2017 T bili was 0.8/AST 368/ALT of 255. Hepatitis A, B, and C serologies have been done in the past as has HIV in 2016 which was negative. [name] Most recent labs on 01/04/2017 T bili 1.6/AST 124/ALT 366, CBC within normal limits, ferritin low at 13. 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 Patient has no family history of liver disease that he is aware of He does have family history of maternal uncle with colon cancer diagnosed in his 54s.  He says he had not had any symptoms referrable to his GI tract until about 3 weeks ago and since then has been having some intermittent nausea, couple of episodes of vomiting. He said no fever or chills. Appetite has been up and down. He describes some intermittent dull aching in his upper abdomen and bloating. His bowel movements have been normal without melena or hematochezia. He admits to being very anxious and is worried about cancer.   He does not drink any alcohol on a regular basis, he does smoke marijuana and vapes  Review of Systems Pertinent positive and negative review of systems were noted in the above HPI section.  All other review of systems was otherwise negative.  Outpatient Encounter Prescriptions as of  01/09/2017  Medication Sig  . ALPRAZolam (XANAX) 0.5 MG tablet Take 1/2 to 1 tablet 3 times a day, only if needed for anxiety.  . bisoprolol-hydrochlorothiazide (ZIAC) 10-6.25 MG tablet Take 1 tablet by mouth daily.  . Cholecalciferol (VITAMIN D3) 5000 units CAPS Take 10,000 Units by mouth daily.  . citalopram (CELEXA) 40 MG tablet Take 1 tablet daily for OCD  . fluticasone (FLONASE) 50 MCG/ACT nasal spray instill 2 sprays into each nostril once daily  . levothyroxine (SYNTHROID, LEVOTHROID) 75 MCG tablet take 1 tablet by mouth every morning ON AN EMPTY STOMACH  . ondansetron (ZOFRAN) 4 MG tablet Take 1 tablet (4 mg total) by mouth every 6 (six) hours.  . ranitidine (ZANTAC) 300 MG tablet Take 1 tablet as needed for heartburn.  Do not exceed 2 tablets in 24 hours.  . Tetrahydroz-Glyc-Hyprom-PEG (VISINE MAXIMUM REDNESS RELIEF) 0.05-0.2-0.36-1 % SOLN Apply 1 drop to eye daily.  . traZODone (DESYREL) 50 MG tablet take 1/2 to 2 tablets by mouth at bedtime if needed for sleep  . [DISCONTINUED] ondansetron (ZOFRAN) 4 MG tablet Take 1 tablet (4 mg total) by mouth every 6 (six) hours.  Marland Kitchen omeprazole (PRILOSEC) 40 MG capsule Take 1 capsule (40 mg total) by mouth daily.   No facility-administered encounter medications on file as of 01/09/2017.    Allergies  Allergen Reactions  . Tylenol [Acetaminophen]     r/t elevated LFTs   Patient Active Problem List   Diagnosis Date Noted  . Drug addiction /  Marajuana ([place]) 01/06/2017  . Hyperlipidemia 01/11/2016  . Medication management 01/11/2016  . Palpitations 12/04/2015  . Hypothyroidism 12/04/2015  . Graves disease 08/18/2015  . Vitamin D deficiency 08/11/2015   Social History   Social History  . Marital status: Single    Spouse name: N/A  . Number of children: N/A  . Years of education: N/A   Occupational History  . Not on file.   Social History Main Topics  . Smoking status: Never Smoker  . Smokeless tobacco: Never Used     Comment:  Marijuana  . Alcohol use No  . Drug use: Yes    Types: Marijuana  . Sexual activity: Not on file   Other Topics Concern  . Not on file   Social History Narrative  . No narrative on file    Kyle Gonzales'Gonzales family history includes Colon cancer in his maternal grandmother; Diabetes in his maternal grandfather, maternal grandmother, and mother; Stomach cancer in his maternal grandfather and maternal grandmother.      Objective:    Vitals:   01/09/17 0937  BP: 110/80  Pulse: 80    Physical Exam  well-developed young African-American male, in no acute distress, pleasant accompanied by his mother blood pressure 110/80, pulse 80, height 6 foot 3, weight 242, BMI 30.2. HEENT; nontraumatic normocephalic EOMI PERRLA, Sclera anicteric, Cardiovascular; regular rate and rhythm with S1-S2 no murmur or gallop, Pulmonary ;clear bilaterally, Abdomen ;large soft nontender nondistended no appreciable mass or hepatosplenomegaly, bowel sounds are present, no fluid wave, Rectal ;exam not done, Extremities ;no clubbing cyanosis or edema skin warm and dry, Neuropsych; mood and affect appropriate       Assessment & Plan:   #74 26 year old African-American male with history of Graves' disease with persistently elevated LFTs 2 years. Patient had been asymptomatic until about 3 weeks ago when he began having some problems with intermittent nausea and dull aching in his upper abdomen which is been intermittent. Etiology of his symptoms is not clear. He has had prior viral hepatitis serologies which were negative, previous HIV negative. 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 We will need to rule him out for underlying cirrhosis, autoimmune liver disease, genetic/metabolic liver disease.   Plan; Schedule for MRI of the abdomen/attention liver We'll check autoimmune markers, metabolic hepatic markers,  pro time/INR, sedimentation rate and CRP. We'll refill Zofran 4 mg every 6 hours when necessary for nausea We'll start a short course of Protonix 40 mg by mouth every morning We briefly discussed possibility of liver biopsy pending results of MRI and labs. Tried to discourage him from repeated trips to the emergency room and assured  him that we would evaluate him thoroughly and help him manage his symptoms. Patient will be established with Dr. Havery Moros. I also discussed the importance of abstinence statements from alcohol, marijuana and/or any other recreational substances.  Kyle Gonzales Alinah Sheard PA-C 01/09/2017   Cc: [name], MD

## 2017-01-09 NOTE — Patient Instructions (Signed)
If you are age 25 or older, your body mass index should be between 23-30. Your Body mass index is 30.25 kg/m. If this is out of the aforementioned range listed, please consider follow up with your Primary Care Provider.  If you are age 48 or younger, your body mass index should be between 19-25. Your Body mass index is 30.25 kg/m. If this is out of the aformentioned range listed, please consider follow up with your Primary Care Provider.   We have sent the following medications to your pharmacy for you to pick up at your convenience: [place] physician has requested that you go to the basement for lab work before leaving today.  You have been scheduled for an MRI at [facility] on 01/12/17 . Your appointment time is 700 am. Please arrive 15 minutes prior to your appointment time for registration purposes. Please make certain not to have anything to eat or drink 4 hours prior to your test. In addition, if you have any metal in your body, have a pacemaker or defibrillator, please be sure to let your ordering physician know. This test typically takes 45 minutes to 1 hour to complete.  Thank you for choosing me and [facility].  Nicoletta Ba, PA

## 2017-01-10 LAB — ANTI-SMOOTH MUSCLE ANTIBODY, IGG

## 2017-01-10 LAB — ANA: Anti Nuclear Antibody(ANA): NEGATIVE

## 2017-01-10 NOTE — Progress Notes (Signed)
Agree with assessment and plan. Will await cross sectional imaging of the liver with MRI and labs to assess for chronic liver diseases. If etiology remains unclear or pending labs, I suspect he will need a liver biopsy.

## 2017-01-11 LAB — ALPHA-1-ANTITRYPSIN: A1 ANTITRYPSIN SER: 166 mg/dL (ref 83–199)

## 2017-01-11 LAB — MITOCHONDRIAL ANTIBODIES: Mitochondrial M2 Ab, IgG: <=20 Units (ref <=20.0)

## 2017-01-11 LAB — CERULOPLASMIN: CERULOPLASMIN: 35 mg/dL (ref 18–36)

## 2017-01-12 ENCOUNTER — Ambulatory Visit (HOSPITAL_COMMUNITY)
Admission: RE | Admit: 2017-01-12 | Discharge: 2017-01-12 | Disposition: A | Discharge status: Home / Self Care | Payer: 59 | Source: Ambulatory Visit | Attending: Physician Assistant | Admitting: Physician Assistant

## 2017-01-12 DIAGNOSIS — R945 Abnormal results of liver function studies: Secondary | ICD-10-CM

## 2017-01-12 DIAGNOSIS — R110 Nausea: Secondary | ICD-10-CM | POA: Diagnosis not present

## 2017-01-12 DIAGNOSIS — R932 Abnormal findings on diagnostic imaging of liver and biliary tract: Secondary | ICD-10-CM | POA: Insufficient documentation

## 2017-01-12 DIAGNOSIS — R7989 Other specified abnormal findings of blood chemistry: Secondary | ICD-10-CM | POA: Insufficient documentation

## 2017-01-12 DIAGNOSIS — R1013 Epigastric pain: Secondary | ICD-10-CM | POA: Insufficient documentation

## 2017-01-12 MED ORDER — GADOBENATE DIMEGLUMINE 529 MG/ML IV SOLN
20.0000 mL | Freq: Once | INTRAVENOUS | Status: AC | PRN
  Administered 2017-01-12: 20 mL via INTRAVENOUS

## 2017-01-16 ENCOUNTER — Other Ambulatory Visit: Payer: Self-pay | Admitting: Internal Medicine

## 2017-01-18 ENCOUNTER — Other Ambulatory Visit: Payer: 59

## 2017-01-18 ENCOUNTER — Ambulatory Visit (INDEPENDENT_AMBULATORY_CARE_PROVIDER_SITE_OTHER): Payer: 59 | Admitting: Gastroenterology

## 2017-01-18 ENCOUNTER — Encounter: Payer: Self-pay | Admitting: Gastroenterology

## 2017-01-18 VITALS — BP 120/76 | HR 96 | Ht 74.0 in | Wt 239.5 lb

## 2017-01-18 DIAGNOSIS — R7989 Other specified abnormal findings of blood chemistry: Secondary | ICD-10-CM

## 2017-01-18 DIAGNOSIS — R945 Abnormal results of liver function studies: Principal | ICD-10-CM

## 2017-01-18 DIAGNOSIS — R932 Abnormal findings on diagnostic imaging of liver and biliary tract: Secondary | ICD-10-CM

## 2017-01-18 DIAGNOSIS — K219 Gastro-esophageal reflux disease without esophagitis: Secondary | ICD-10-CM

## 2017-01-18 NOTE — Patient Instructions (Addendum)
If you are age 25 or older, your body mass index should be between 23-30. Your Body mass index is 30.75 kg/m. If this is out of the aforementioned range listed, please consider follow up with your Primary Care Provider.  If you are age 26 or younger, your body mass index should be between 19-25. Your Body mass index is 30.75 kg/m. If this is out of the aformentioned range listed, please consider follow up with your Primary Care Provider.   Your physician has requested that you go to the basement for the following lab work before leaving today:  Igg, IGG 4, P-anca  Thank you.

## 2017-01-18 NOTE — Progress Notes (Signed)
HPI :  26 year old male here for a follow-up visit. Kyle Gonzales seen in the clinic by Kyle Gonzales last month for chronic elevation in ALT and bilirubin as well as abnormal liver imaging on remote ultrasound. He has a chronic ALT elevation dating back a few years, upwards of 300s, with normal AP and mild hyperbilirubinemia.  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 Caudate lobe enlargement without obvious cirrhosis. Mild intrahepatic ductal dilation with normal CBD. Labs for chronic liver diseases negative to date.  He is feeling well, feels normal today. No abdominal pains. No history of jaundice. No pruritus.  No diarrhea or constipation. No blood in the stools.  No FH of liver disease. No FH of IBD.   He was given omeprazole 40mg  for symptoms of reflux and this is working well for his GERD. Patient present with mother and we discussed findings.     Past Medical History:  Diagnosis Date  . Graves disease 08/18/2015  . Hyperlipidemia   . Hypothyroidism   . Palpitations   . Vitamin D deficiency      Past Surgical History:  Procedure Laterality Date  . ANTERIOR CRUCIATE LIGAMENT REPAIR  2012   Family History  Problem Relation Age of Onset  . Diabetes Mother   . Colon cancer Maternal Grandmother   . Stomach cancer Maternal Grandmother   . Diabetes Maternal Grandmother   . Stomach cancer Maternal Grandfather   . Diabetes Maternal Grandfather    Social History  Substance Use Topics  . Smoking status: Never Smoker  . Smokeless tobacco: Never Used     Comment: Marijuana  . Alcohol use No   Current Outpatient Prescriptions  Medication Sig Dispense Refill  . ALPRAZolam (XANAX) 0.5 MG tablet Take 1/2 to 1 tablet 3 times a day, only if needed for anxiety. 50 tablet 0  . bisoprolol-hydrochlorothiazide (ZIAC) 10-6.25 MG tablet Take 1 tablet by mouth daily. 30 tablet 3  . Cholecalciferol (VITAMIN D3) 5000 units CAPS Take  10,000 Units by mouth daily.    . citalopram (CELEXA) 40 MG tablet Take 1 tablet daily for OCD 90 tablet 1  . fluticasone (FLONASE) 50 MCG/ACT nasal spray instill 2 sprays into each nostril once daily 16 g 0  . levothyroxine (SYNTHROID, LEVOTHROID) 75 MCG tablet take 1 tablet by mouth every morning ON AN EMPTY STOMACH 30 tablet 2  . omeprazole (PRILOSEC) 40 MG capsule Take 1 capsule (40 mg total) by mouth daily. 30 capsule 1  . ondansetron (ZOFRAN) 4 MG tablet Take 1 tablet (4 mg total) by mouth every 6 (six) hours. 50 tablet 1  . ranitidine (ZANTAC) 300 MG tablet Take 1 tablet as needed for heartburn.  Do not exceed 2 tablets in 24 hours. 90 tablet 1  . Tetrahydroz-Glyc-Hyprom-PEG (VISINE MAXIMUM REDNESS RELIEF) 0.05-0.2-0.36-1 % SOLN Apply 1 drop to eye daily.    . traZODone (DESYREL) 50 MG tablet take 1/2 to 2 tablets by mouth at bedtime if needed for sleep 60 tablet 0   No current facility-administered medications for this visit.    Allergies  Allergen Reactions  . Tylenol [Acetaminophen]     r/t elevated LFTs     Review of Systems: All systems reviewed and negative except where noted in HPI.    Kyle Gonzales W Wo Contrast  Result Date: 01/12/2017 CLINICAL DATA:  Persistently elevated liver function tests. 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 History of Graves disease.  Evaluate for cirrhosis or autoimmune liver disease. EXAM: MRI Gonzales WITHOUT AND WITH CONTRAST TECHNIQUE: Multiplanar multisequence Kyle imaging of the Gonzales was performed both before and after the administration of intravenous contrast. CONTRAST:  67mL MULTIHANCE GADOBENATE DIMEGLUMINE 529 MG/ML IV SOLN COMPARISON:  Ultrasound of the Gonzales of 08/13/2015. FINDINGS: Portions of exam are mild to moderately motion degraded. Lower chest: Normal heart size without pericardial or pleural effusion. Hepatobiliary: Right hepatic lobe 17 mm lesion on image 26/series 3 is consistent  with a hemangioma. The caudate lobe is enlarged, including on image 31/series 902. No specific evidence of cirrhosis. Gallbladder sludge or small stones, without acute cholecystitis. There is mild intrahepatic biliary duct dilatation throughout. Example image 15/series 3, image 26/series 902. The common duct is normal in caliber, without obstructive stone or mass to explain intrahepatic duct dilatation. Pancreas:  Normal, without mass or ductal dilatation. Spleen:  Normal in size, without focal abnormality. Adrenals/Urinary Tract: Normal adrenal glands. Normal kidneys, without hydronephrosis. Stomach/Bowel: Normal stomach and small bowel loops. No evidence of colitis. Vascular/Lymphatic: Normal caliber of the aorta and branch vessels. 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 Example image 21/series 3. Apparent right portal vein central hypoenhancement is favored to be artifactual, including image 49/ series 905. No retroperitoneal or retrocrural adenopathy. Other:  No ascites. Musculoskeletal: No acute osseous abnormality. IMPRESSION: 1. Mild to moderately motion degraded exam. 2. Cavernous transformation of the portal vein, as on prior ultrasound. Caudate lobe enlargement, nonspecific. No specific evidence of cirrhosis. 3. Intrahepatic biliary duct dilatation, without common duct dilatation. No obstructive stone or mass. Suspect infectious or inflammatory etiology, including primary sclerosing cholangitis. HIV cholangiopathy could have a similar appearance, but the patient is HIV negative per clinic note. ERCP may be informative. Electronically Signed   By: Kyle Gonzales M.D.   On: 01/12/2017 12:30   Lab Results  Component Value Date   WBC 10.6 (H) 01/06/2017   HGB 14.3 01/06/2017   HCT 41.4 01/06/2017   MCV 85.9 01/06/2017   PLT 147 (L) 01/06/2017    Lab Results  Component Value Date   INR 1.1 (H) 01/09/2017   INR 1.03 01/04/2017   INR 1.0 01/01/2017    Lab Results  Component  Value Date   ALT 166 (H) 01/06/2017   AST 41 01/06/2017   ALKPHOS 81 01/06/2017   BILITOT 1.5 (H) 01/06/2017      Physical Exam: BP 120/76 (BP Location: Left Arm, Patient Position: Sitting, Cuff Size: Large)   Pulse 96   Ht 6\' 2"  (1.88 m) Comment: height measured without shoes  Wt 239 lb 8 oz (108.6 kg)   BMI 30.75 kg/m  Constitutional: Pleasant,well-developed, male in no acute distress. HEENT: Normocephalic and atraumatic. Conjunctivae are normal. No scleral icterus. Neck supple.  Cardiovascular: Normal rate, regular rhythm.  Pulmonary/chest: Effort normal and breath sounds normal. No wheezing, rales or rhonchi. Abdominal: Soft, protuberant, nontender. There are no masses palpable. No hepatomegaly. Extremities: no edema Lymphadenopathy: No cervical adenopathy noted. Neurological: Alert and oriented to person place and time. Skin: Skin is warm and dry. No rashes noted. Psychiatric: Normal mood and affect. Behavior is normal.   ASSESSMENT AND PLAN: 26 year old male here for reassessment of the following issues:  Chronic elevation in liver associated enzymes / Abnormal liver imaging - patient has had chronic ALT and bilirubin elevation dating back a few years. [place] in 2016 showed cavernous transformation of the portal vein but he did not have any follow up evaluation until recently. Labs  for chronic liver diseases including AMA, SMA, ANA, ceruloplasmin, alpha one antitrypsin, iron studies, viral hepatitis negative. 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 I discussed the MRI findings with Dr. Clovis Riley of radiology - specifically question whether or not this has typical appearance for [facility], which he states was possible but not classic. Study was mildly limited by motion artifact. His AP is normal. Differential includes PSC, small duct PSC, IgG4  cholangiopathy, infiltrative disorders such as sarcoidosis, etc. amongst others. I will send IgG level, IgG4, and p-ANCA to further evaluated. I otherwise discussed next step to further evaluate and make diagnosis would be a liver biopsy and or MRCP. After discussion with radiologist, I think liver biopsy may be more useful. If liver biopsy is normal or non-diagnostic would proceed with MRCP to further assess for [facility]. If he has [place] he would need a colonoscopy to screen for IBD. I discussed long term risks of chronic parenchymal inflammation to include cirrhosis, and we need to diagnose etiology and treat accordingly. Following discussion of all of these issues patient and mother was agreeable to liver biopsy if needed. Will coordinate and await labs, further recommendations pending the results.   GERD - symptoms resolved with omeprazole, he can use it as needed moving forward.  Belton Cellar, MD [facility] Gastroenterology Pager [phone]

## 2017-01-19 ENCOUNTER — Other Ambulatory Visit: Payer: Self-pay

## 2017-01-19 ENCOUNTER — Telehealth: Payer: Self-pay

## 2017-01-19 DIAGNOSIS — R932 Abnormal findings on diagnostic imaging of liver and biliary tract: Secondary | ICD-10-CM

## 2017-01-19 DIAGNOSIS — R7989 Other specified abnormal findings of blood chemistry: Secondary | ICD-10-CM

## 2017-01-19 DIAGNOSIS — R945 Abnormal results of liver function studies: Secondary | ICD-10-CM

## 2017-01-19 LAB — PAN-ANCA
ANCA Screen: NEGATIVE
Myeloperoxidase Abs: <1
Serine Protease 3: <1

## 2017-01-19 LAB — IGG 4: IGG 4: 91 mg/dL (ref 2–96)

## 2017-01-19 LAB — IGG: IGG (IMMUNOGLOBIN G), SERUM: 1336 mg/dL (ref 694–1618)

## 2017-01-19 NOTE — Telephone Encounter (Signed)
-----   Message from Manus Gunning, MD sent at 01/18/2017 12:25 PM EDT ----- Regarding: liver biopsy Hi Almyra Free, I saw this patient this morning but had to touch base with radiology prior to proceeding with a liver biopsy, but I do think this patient needs a liver biopsy. Can you help coordinate liver biopsy with IR. I have discussed it with the patient and his mom and they wanted to proceed if needed. Can you let me know? Thanks much

## 2017-01-19 NOTE — Telephone Encounter (Signed)
Great - thanks

## 2017-01-19 NOTE — Telephone Encounter (Signed)
Order placed for IR liver biopsy, spoke to [place] in scheduling and they will call patient/mom to get this scheduled. Left a message for patient's mom to expect a call from [facility] radiology about scheduling this test.

## 2017-01-22 ENCOUNTER — Telehealth: Payer: Self-pay | Admitting: Gastroenterology

## 2017-01-23 NOTE — Telephone Encounter (Signed)
Patient mom calling back regarding this. Best # [phone]

## 2017-01-26 ENCOUNTER — Other Ambulatory Visit: Payer: Self-pay | Admitting: Radiology

## 2017-01-29 ENCOUNTER — Ambulatory Visit (HOSPITAL_COMMUNITY)
Admission: RE | Admit: 2017-01-29 | Discharge: 2017-01-29 | Disposition: A | Discharge status: Home / Self Care | Payer: 59 | Source: Ambulatory Visit | Attending: Gastroenterology | Admitting: Gastroenterology

## 2017-01-29 ENCOUNTER — Encounter (HOSPITAL_COMMUNITY): Payer: Self-pay

## 2017-01-29 DIAGNOSIS — Z7951 Long term (current) use of inhaled steroids: Secondary | ICD-10-CM | POA: Insufficient documentation

## 2017-01-29 DIAGNOSIS — Z79899 Other long term (current) drug therapy: Secondary | ICD-10-CM | POA: Diagnosis not present

## 2017-01-29 DIAGNOSIS — R748 Abnormal levels of other serum enzymes: Secondary | ICD-10-CM | POA: Diagnosis present

## 2017-01-29 DIAGNOSIS — E559 Vitamin D deficiency, unspecified: Secondary | ICD-10-CM | POA: Insufficient documentation

## 2017-01-29 DIAGNOSIS — R7989 Other specified abnormal findings of blood chemistry: Secondary | ICD-10-CM

## 2017-01-29 DIAGNOSIS — E039 Hypothyroidism, unspecified: Secondary | ICD-10-CM | POA: Insufficient documentation

## 2017-01-29 DIAGNOSIS — R932 Abnormal findings on diagnostic imaging of liver and biliary tract: Secondary | ICD-10-CM

## 2017-01-29 DIAGNOSIS — R945 Abnormal results of liver function studies: Secondary | ICD-10-CM

## 2017-01-29 LAB — CBC
HCT: 39.7 % (ref 39.0–52.0)
HEMOGLOBIN: 13.3 g/dL (ref 13.0–17.0)
MCH: 28.9 pg (ref 26.0–34.0)
MCHC: 33.5 g/dL (ref 30.0–36.0)
MCV: 86.1 fL (ref 78.0–100.0)
Platelets: 109 10*3/uL — ABNORMAL LOW (ref 150–400)
RBC: 4.61 MIL/uL (ref 4.22–5.81)
RDW: 14.2 % (ref 11.5–15.5)
WBC: 4.2 10*3/uL (ref 4.0–10.5)

## 2017-01-29 LAB — PROTIME-INR
INR: 0.97
Prothrombin Time: 12.9 seconds (ref 11.4–15.2)

## 2017-01-29 LAB — APTT: aPTT: 27 seconds (ref 24–36)

## 2017-01-29 MED ORDER — MIDAZOLAM HCL 2 MG/2ML IJ SOLN
INTRAMUSCULAR | Status: AC
  Filled 2017-01-29: qty 6

## 2017-01-29 MED ORDER — OXYCODONE HCL 5 MG PO TABS
5.0000 mg | ORAL_TABLET | ORAL | Status: DC | PRN
Start: 2017-01-29 — End: 2017-01-30
  Administered 2017-01-29: 5 mg via ORAL
  Filled 2017-01-29 (×3): qty 1

## 2017-01-29 MED ORDER — FENTANYL CITRATE (PF) 100 MCG/2ML IJ SOLN
INTRAMUSCULAR | Status: AC
  Filled 2017-01-29: qty 4

## 2017-01-29 MED ORDER — SODIUM CHLORIDE 0.9 % IV SOLN
INTRAVENOUS | Status: DC
  Administered 2017-01-29: 11:00:00 via INTRAVENOUS

## 2017-01-29 MED ORDER — MIDAZOLAM HCL 2 MG/2ML IJ SOLN
INTRAMUSCULAR | Status: AC | PRN
  Administered 2017-01-29 (×3): 1 mg via INTRAVENOUS

## 2017-01-29 MED ORDER — FENTANYL CITRATE (PF) 100 MCG/2ML IJ SOLN
INTRAMUSCULAR | Status: AC | PRN
  Administered 2017-01-29: 50 ug via INTRAVENOUS

## 2017-01-29 NOTE — Procedures (Signed)
US guided core biopsies of right hepatic lobe.  3 cores obtained.  No immediate complication.  Minimal blood loss.   

## 2017-01-29 NOTE — Progress Notes (Signed)
Pre liver biopsy in Short Stay. In computer unable to chart when home meds last taken (computer froze -working on getting fixed). Last doses of all daily medicines yesterday 10 am (Xanax, Ziac, Vit D3, Celexa, Synthroid, Prilosec). Trazodone last pm. One week ago Zofran and Zantac.

## 2017-01-29 NOTE — Consult Note (Signed)
Chief Complaint: Patient was seen in consultation today for image guided random core liver biopsy  Referring Physician(s): Manus Gunning  Supervising Physician: Markus Daft  Patient Status: [facility] - Out-pt  History of Present Illness: Kyle Gonzales is a 26 y.o. 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 He presents today for image guided random core liver biopsy for further evaluation.  Past Medical History:  Diagnosis Date  . Graves disease 08/18/2015  . Hyperlipidemia   . Hypothyroidism   . Palpitations   . Vitamin D deficiency     Past Surgical History:  Procedure Laterality Date  . ANTERIOR CRUCIATE LIGAMENT REPAIR  2012    Allergies: Tylenol [acetaminophen]  Medications: Prior to Admission medications   Medication Sig Start Date End Date Taking? Authorizing Provider  ALPRAZolam Duanne Moron) 0.5 MG tablet Take 1/2 to 1 tablet 3 times a day, only if needed for anxiety. 01/04/17   [name], MD  bisoprolol-hydrochlorothiazide [facility]) 10-6.25 MG tablet Take 1 tablet by mouth daily. 10/02/16   [name], MD  Cholecalciferol (VITAMIN D3) 5000 units CAPS Take 10,000 Units by mouth daily.    Historical Provider, MD  citalopram (CELEXA) 40 MG tablet Take 1 tablet daily for OCD 12/27/16 06/29/17  [name], MD  fluticasone [facility]) 50 MCG/ACT nasal spray instill 2 sprays into each nostril once daily 05/13/16   [name], MD  levothyroxine (SYNTHROID, LEVOTHROID) 75 MCG tablet take 1 tablet by mouth every morning ON AN EMPTY STOMACH 01/16/17   Vicie Mutters, PA-C  omeprazole (PRILOSEC) 40 MG capsule Take 1 capsule (40 mg total) by mouth daily. 01/09/17   Amy S Esterwood, PA-C  ondansetron (ZOFRAN) 4 MG tablet Take  1 tablet (4 mg total) by mouth every 6 (six) hours. 01/09/17   Amy S Esterwood, PA-C  ranitidine (ZANTAC) 300 MG tablet Take 1 tablet as needed for heartburn.  Do not exceed 2 tablets in 24 hours. 04/12/16 04/12/17  Courtney Forcucci, PA-C  Tetrahydroz-Glyc-Hyprom-PEG (VISINE MAXIMUM REDNESS RELIEF) 0.05-0.2-0.36-1 % SOLN Apply 1 drop to eye daily.    Historical Provider, MD  traZODone (DESYREL) 50 MG tablet take 1/2 to 2 tablets by mouth at bedtime if needed for sleep 01/04/17   [name], MD     Family History  Problem Relation Age of Onset  . Diabetes Mother   . Colon cancer Maternal Grandmother   . Stomach cancer Maternal Grandmother   . Diabetes Maternal Grandmother   . Stomach cancer Maternal Grandfather   . Diabetes Maternal Grandfather     Social History   Social History  . Marital status: Single    Spouse name: N/A  . Number of children: N/A  . Years of education: N/A   Social History Main Topics  . Smoking status: Never Smoker  . Smokeless tobacco: Never Used     Comment: Marijuana  . Alcohol use No  . Drug use: Yes    Types: Marijuana  . Sexual activity: Not on file   Other Topics Concern  . Not on file   Social History Narrative  . No narrative on file      Review of Systems denies fever, headache, chest pain,  cough, abdominal pain, nausea, vomiting or abnormal bleeding. He does have some occasional back pain.  Vital Signs: BP Marland Kitchen)  150/72 (BP Location: Right Arm)   Pulse 76   Temp 97.4 F (36.3 C) (Oral)   Resp 18   SpO2 100%   Physical Exam Awake, alert. Chest is clear to auscultation bilaterally. Heart with regular rate and rhythm. Abdomen soft, positive bowel sounds, nontender. No lower extremity edema.  Mallampati Score:     Imaging: Mr Abdomen W Wo Contrast  Result Date: 01/12/2017 CLINICAL DATA:  Persistently elevated liver function tests. 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 History of  Graves disease. Evaluate for cirrhosis or autoimmune liver disease. EXAM: MRI ABDOMEN WITHOUT AND WITH CONTRAST TECHNIQUE: Multiplanar multisequence MR imaging of the abdomen was performed both before and after the administration of intravenous contrast. CONTRAST:  67mL MULTIHANCE GADOBENATE DIMEGLUMINE 529 MG/ML IV SOLN COMPARISON:  Ultrasound of the abdomen of 08/13/2015. FINDINGS: Portions of exam are mild to moderately motion degraded. Lower chest: Normal heart size without pericardial or pleural effusion. Hepatobiliary: Right hepatic lobe 17 mm lesion on image 26/series 3 is consistent with a hemangioma. The caudate lobe is enlarged, including on image 31/series 902. No specific evidence of cirrhosis. Gallbladder sludge or small stones, without acute cholecystitis. There is mild intrahepatic biliary duct dilatation throughout. Example image 15/series 3, image 26/series 902. The common duct is normal in caliber, without obstructive stone or mass to explain intrahepatic duct dilatation. Pancreas:  Normal, without mass or ductal dilatation. Spleen:  Normal in size, without focal abnormality. Adrenals/Urinary Tract: Normal adrenal glands. Normal kidneys, without hydronephrosis. Stomach/Bowel: Normal stomach and small bowel loops. No evidence of colitis. Vascular/Lymphatic: Normal caliber of the aorta and branch vessels. 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 Example image 21/series 3. Apparent right portal vein central hypoenhancement is favored to be artifactual, including image 49/ series 905. No retroperitoneal or retrocrural adenopathy. Other:  No ascites. Musculoskeletal: No acute osseous abnormality. IMPRESSION: 1. Mild to moderately motion degraded exam. 2. Cavernous transformation of the portal vein, as on prior ultrasound. Caudate lobe enlargement, nonspecific. No specific evidence of cirrhosis. 3. Intrahepatic biliary duct dilatation, without common duct dilatation. No obstructive  stone or mass. Suspect infectious or inflammatory etiology, including primary sclerosing cholangitis. HIV cholangiopathy could have a similar appearance, but the patient is HIV negative per clinic note. ERCP may be informative. Electronically Signed   By: Abigail Miyamoto M.D.   On: 01/12/2017 12:30    Labs:  CBC:  Recent Labs  04/12/16 1156 12/27/16 1008 01/04/17 0221 01/06/17 1016  WBC 4.4 5.1 8.8 10.6*  HGB 14.1 14.0 14.9 14.3  HCT 41.3 43.5 42.8 41.4  PLT 134* 124* 156 147*    COAGS:  Recent Labs  01/01/17 1103 01/04/17 0221 01/09/17 1035  INR 1.0 1.03 1.1*    BMP:  Recent Labs  04/12/16 1156 12/27/16 1008 01/04/17 0221 01/06/17 1016  NA 141 137 137 135  K 4.2 4.2 3.2* 3.6  CL 103 101 103 100*  CO2 26 27 19* 24  GLUCOSE 101* 109* 123* 104*  BUN 8 12 13 13   CALCIUM 10.1 9.5 10.1 10.1  CREATININE 0.92 0.97 1.16 1.19  GFRNONAA >89 >89 >60 >60  GFRAA >89 >89 >60 >60    LIVER FUNCTION TESTS:  Recent Labs  05/25/16 1642 12/27/16 1008 01/04/17 0221 01/06/17 1016  BILITOT 0.8 0.4 1.6* 1.5*  AST 33 71* 124* 41  ALT 75* 122* 366* 166*  ALKPHOS 89 66 98 81  PROT 7.5 7.6 8.6* 8.6*  ALBUMIN 4.9 4.7 5.1* 5.0  TUMOR MARKERS: No results for input(s): AFPTM, CEA, CA199, CHROMGRNA in the last 8760 hours.  Assessment and Plan: 26 y.o. 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 He presents today for image guided random core liver biopsy for further evaluation.Risks and benefits discussed with the patient/mother including, but not limited to bleeding, infection, damage to adjacent structures or low yield requiring additional tests. All of the patient's questions were answered, patient is agreeable to proceed.Consent  signed and in chart.      Thank you for this interesting consult.  I greatly enjoyed meeting Kyle Gonzales and look forward to participating in their care.  A copy of this report was sent to the requesting provider on this date.  Electronically Signed: D. Rowe Robert 01/29/2017, 11:17 AM   I spent a total of 20 minutes  in face to face in clinical consultation, greater than 50% of which was counseling/coordinating care for image guided random core liver biopsy

## 2017-01-29 NOTE — Discharge Instructions (Signed)
Moderate Conscious Sedation, Adult, Care After °These instructions provide you with information about caring for yourself after your procedure. Your health care provider may also give you more specific instructions. Your treatment has been planned according to current medical practices, but problems sometimes occur. Call your health care provider if you have any problems or questions after your procedure. °What can I expect after the procedure? °After your procedure, it is common: °· To feel sleepy for several hours. °· To feel clumsy and have poor balance for several hours. °· To have poor judgment for several hours. °· To vomit if you eat too soon. °Follow these instructions at home: °For at least 24 hours after the procedure:  ° °· Do not: °¨ Participate in activities where you could fall or become injured. °¨ Drive. °¨ Use heavy machinery. °¨ Drink alcohol. °¨ Take sleeping pills or medicines that cause drowsiness. °¨ Make important decisions or sign legal documents. °¨ Take care of children on your own. °· Rest. °Eating and drinking  °· Follow the diet recommended by your health care provider. °· If you vomit: °¨ Drink water, juice, or soup when you can drink without vomiting. °¨ Make sure you have little or no nausea before eating solid foods. °General instructions  °· Have a responsible adult stay with you until you are awake and alert. °· Take over-the-counter and prescription medicines only as told by your health care provider. °· If you smoke, do not smoke without supervision. °· Keep all follow-up visits as told by your health care provider. This is important. °Contact a health care provider if: °· You keep feeling nauseous or you keep vomiting. °· You feel light-headed. °· You develop a rash. °· You have a fever. °Get help right away if: °· You have trouble breathing. °This information is not intended to replace advice given to you by your health care provider. Make sure you discuss any questions you  have with your health care provider. °Document Released: 07/23/2013 Document Revised: 03/06/2016 Document Reviewed: 01/22/2016 °Elsevier Interactive Patient Education © 2017 [employer] ° ° °Liver Biopsy, Care After °These instructions give you information on caring for yourself after your procedure. Your doctor may also give you more specific instructions. Call your doctor if you have any problems or questions after your procedure. °Follow these instructions at home: °· Rest at home for 1-2 days or as told by your doctor. °· Have someone stay with you for at least 24 hours. °· Do not do these things in the first 24 hours: °¨ Drive. °¨ Use machinery. °¨ Take care of other people. °¨ Sign legal documents. °¨ Take a bath or shower. °· There are many different ways to close and cover a cut (incision). For example, a cut can be closed with stitches, skin glue, or adhesive strips. Follow your doctor's instructions on: °¨ Taking care of your cut. °¨ Changing and removing your bandage (dressing). °¨ Removing whatever was used to close your cut. °· Do not drink alcohol in the first week. °· Do not lift more than 5 pounds or play contact sports for the first 2 weeks. °· Take medicines only as told by your doctor. For 1 week, do not take medicine that has aspirin in it or medicines like ibuprofen. °· Get your test results. °Contact a doctor if: °· A cut bleeds and leaves more than just a small spot of blood. °· A cut is red, puffs up (swells), or hurts more than before. °· Fluid or   something else comes from a cut. °· A cut smells bad. °· You have a fever or chills. °Get help right away if: °· You have swelling, bloating, or pain in your belly (abdomen). °· You get dizzy or faint. °· You have a rash. °· You feel sick to your stomach (nauseous) or throw up (vomit). °· You have trouble breathing, feel short of breath, or feel faint. °· Your chest hurts. °· You have problems talking or seeing. °· You have trouble balancing or  moving your arms or legs. °This information is not intended to replace advice given to you by your health care provider. Make sure you discuss any questions you have with your health care provider. °Document Released: 07/11/2008 Document Revised: 03/09/2016 Document Reviewed: 11/28/2013 °Elsevier Interactive Patient Education © 2017 Elsevier Inc. ° °

## 2017-02-01 ENCOUNTER — Other Ambulatory Visit: Payer: Self-pay

## 2017-02-01 ENCOUNTER — Telehealth: Payer: Self-pay

## 2017-02-01 DIAGNOSIS — R7989 Other specified abnormal findings of blood chemistry: Secondary | ICD-10-CM

## 2017-02-01 DIAGNOSIS — R945 Abnormal results of liver function studies: Principal | ICD-10-CM

## 2017-02-01 NOTE — Telephone Encounter (Signed)
Spoke to patient's mother, she is aware of MRCP w/ and w/o that is scheduled for 5/1 at [facility], arrive at 7:45 for a 8:00 test. Understands to be NPO after midnight.

## 2017-02-12 ENCOUNTER — Ambulatory Visit: Payer: 59 | Admitting: Gastroenterology

## 2017-02-12 ENCOUNTER — Other Ambulatory Visit: Payer: Self-pay | Admitting: Gastroenterology

## 2017-02-12 DIAGNOSIS — R945 Abnormal results of liver function studies: Principal | ICD-10-CM

## 2017-02-12 DIAGNOSIS — R7989 Other specified abnormal findings of blood chemistry: Secondary | ICD-10-CM

## 2017-02-13 ENCOUNTER — Ambulatory Visit (HOSPITAL_COMMUNITY)
Admission: RE | Admit: 2017-02-13 | Discharge: 2017-02-13 | Disposition: A | Discharge status: Home / Self Care | Payer: 59 | Source: Ambulatory Visit | Attending: Gastroenterology | Admitting: Gastroenterology

## 2017-02-13 DIAGNOSIS — R7989 Other specified abnormal findings of blood chemistry: Secondary | ICD-10-CM | POA: Diagnosis present

## 2017-02-13 DIAGNOSIS — K8020 Calculus of gallbladder without cholecystitis without obstruction: Secondary | ICD-10-CM | POA: Diagnosis not present

## 2017-02-13 DIAGNOSIS — D1803 Hemangioma of intra-abdominal structures: Secondary | ICD-10-CM | POA: Diagnosis not present

## 2017-02-13 DIAGNOSIS — R945 Abnormal results of liver function studies: Secondary | ICD-10-CM

## 2017-02-13 DIAGNOSIS — R161 Splenomegaly, not elsewhere classified: Secondary | ICD-10-CM | POA: Insufficient documentation

## 2017-02-13 MED ORDER — GADOBENATE DIMEGLUMINE 529 MG/ML IV SOLN
20.0000 mL | Freq: Once | INTRAVENOUS | Status: AC | PRN
  Administered 2017-02-13: 20 mL via INTRAVENOUS

## 2017-02-15 ENCOUNTER — Other Ambulatory Visit: Payer: Self-pay | Admitting: Internal Medicine

## 2017-02-23 ENCOUNTER — Telehealth: Payer: Self-pay

## 2017-02-23 NOTE — Telephone Encounter (Signed)
Received confirmation of an appt for pt at [facility]. Appt is scheduled for June 19th at 11:00am. There office will send [place] notes after pt is seen.

## 2017-03-17 ENCOUNTER — Other Ambulatory Visit: Payer: Self-pay | Admitting: Physician Assistant

## 2017-04-08 DIAGNOSIS — R740 Nonspecific elevation of levels of transaminase and lactic acid dehydrogenase [LDH]: Secondary | ICD-10-CM

## 2017-04-08 DIAGNOSIS — R7989 Other specified abnormal findings of blood chemistry: Secondary | ICD-10-CM | POA: Insufficient documentation

## 2017-04-08 DIAGNOSIS — R7401 Elevation of levels of liver transaminase levels: Secondary | ICD-10-CM | POA: Insufficient documentation

## 2017-04-08 DIAGNOSIS — R945 Abnormal results of liver function studies: Secondary | ICD-10-CM | POA: Insufficient documentation

## 2017-04-09 ENCOUNTER — Ambulatory Visit (INDEPENDENT_AMBULATORY_CARE_PROVIDER_SITE_OTHER): Payer: 59 | Admitting: Internal Medicine

## 2017-04-09 ENCOUNTER — Ambulatory Visit: Payer: Self-pay | Admitting: Internal Medicine

## 2017-04-09 ENCOUNTER — Encounter: Payer: Self-pay | Admitting: Internal Medicine

## 2017-04-09 VITALS — BP 144/90 | HR 69 | Temp 97.7°F | Resp 16 | Ht 74.0 in | Wt 256.0 lb

## 2017-04-09 DIAGNOSIS — E890 Postprocedural hypothyroidism: Secondary | ICD-10-CM

## 2017-04-09 DIAGNOSIS — R7401 Elevation of levels of liver transaminase levels: Secondary | ICD-10-CM

## 2017-04-09 DIAGNOSIS — R945 Abnormal results of liver function studies: Secondary | ICD-10-CM | POA: Diagnosis not present

## 2017-04-09 DIAGNOSIS — Z79899 Other long term (current) drug therapy: Secondary | ICD-10-CM | POA: Diagnosis not present

## 2017-04-09 DIAGNOSIS — R7989 Other specified abnormal findings of blood chemistry: Secondary | ICD-10-CM

## 2017-04-09 DIAGNOSIS — K219 Gastro-esophageal reflux disease without esophagitis: Secondary | ICD-10-CM

## 2017-04-09 DIAGNOSIS — E782 Mixed hyperlipidemia: Secondary | ICD-10-CM | POA: Diagnosis not present

## 2017-04-09 DIAGNOSIS — I10 Essential (primary) hypertension: Secondary | ICD-10-CM

## 2017-04-09 DIAGNOSIS — E559 Vitamin D deficiency, unspecified: Secondary | ICD-10-CM

## 2017-04-09 DIAGNOSIS — R740 Nonspecific elevation of levels of transaminase and lactic acid dehydrogenase [LDH]: Secondary | ICD-10-CM

## 2017-04-09 DIAGNOSIS — Z131 Encounter for screening for diabetes mellitus: Secondary | ICD-10-CM

## 2017-04-09 LAB — HEPATIC FUNCTION PANEL
ALT: 65 U/L — ABNORMAL HIGH (ref 9–46)
AST: 39 U/L (ref 10–40)
Albumin: 4.3 g/dL (ref 3.6–5.1)
Alkaline Phosphatase: 112 U/L (ref 40–115)
BILIRUBIN DIRECT: 0.1 mg/dL (ref <=0.2)
BILIRUBIN TOTAL: 0.5 mg/dL (ref 0.2–1.2)
Indirect Bilirubin: 0.4 mg/dL (ref 0.2–1.2)
Total Protein: 7 g/dL (ref 6.1–8.1)

## 2017-04-09 LAB — CBC WITH DIFFERENTIAL/PLATELET
BASOS ABS: 0 {cells}/uL (ref 0–200)
Basophils Relative: 0 %
Eosinophils Absolute: 86 cells/uL (ref 15–500)
Eosinophils Relative: 2 %
HCT: 41.8 % (ref 38.5–50.0)
Hemoglobin: 13.5 g/dL (ref 13.2–17.1)
Lymphocytes Relative: 21 %
Lymphs Abs: 903 cells/uL (ref 850–3900)
MCH: 28.9 pg (ref 27.0–33.0)
MCHC: 32.3 g/dL (ref 32.0–36.0)
MCV: 89.5 fL (ref 80.0–100.0)
MONOS PCT: 13 %
MPV: 10.6 fL (ref 7.5–12.5)
Monocytes Absolute: 559 cells/uL (ref 200–950)
NEUTROS ABS: 2752 {cells}/uL (ref 1500–7800)
NEUTROS PCT: 64 %
PLATELETS: 125 10*3/uL — AB (ref 140–400)
RBC: 4.67 MIL/uL (ref 4.20–5.80)
RDW: 14.9 % (ref 11.0–15.0)
WBC: 4.3 10*3/uL (ref 3.8–10.8)

## 2017-04-09 LAB — BASIC METABOLIC PANEL WITH GFR
BUN: 9 mg/dL (ref 7–25)
CALCIUM: 9.1 mg/dL (ref 8.6–10.3)
CO2: 25 mmol/L (ref 20–31)
CREATININE: 1.04 mg/dL (ref 0.60–1.35)
Chloride: 104 mmol/L (ref 98–110)
GFR, Est African American: >89 mL/min (ref >=60)
GFR, Est Non African American: >89 mL/min (ref >=60)
Glucose, Bld: 89 mg/dL (ref 65–99)
Potassium: 3.9 mmol/L (ref 3.5–5.3)
SODIUM: 138 mmol/L (ref 135–146)

## 2017-04-09 LAB — LIPID PANEL
CHOL/HDL RATIO: 3.8 ratio (ref <5.0)
CHOLESTEROL: 233 mg/dL — AB (ref <200)
HDL: 62 mg/dL (ref >40)
LDL Cholesterol: 150 mg/dL — ABNORMAL HIGH (ref <100)
Triglycerides: 107 mg/dL (ref <150)
VLDL: 21 mg/dL (ref <30)

## 2017-04-09 LAB — TSH: TSH: 5.91 mIU/L — ABNORMAL HIGH (ref 0.40–4.50)

## 2017-04-09 NOTE — Progress Notes (Signed)
This very nice 26 y.o. SWM presents for 3 month follow up with Hypertension, Hyperlipidemia, Pre-Diabetes and Vitamin D Deficiency.  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 Patient also has GERD & has sx's of break thru dyspepsia if he forgets his meds.      Patient is treated for HTN (11/2014). Today's BP is not at goal and is elevated at 144/98. Patient has had no complaints of any cardiac type chest pain, palpitations, dyspnea/orthopnea/PND, dizziness, claudication, or dependent edema. It's speculated that he's not consistently taking his BP meds.     Patient has hx/o HLD with Chol 352 and LDL 212 in Feb 2017 and Chol 240 and LDL 155 in Mar this year.Treatment w/statins has been deferred pending of final dx of his liver disease.  Last Lipids were not at goal: Lab Results  Component Value Date   CHOL 240 (H) 12/27/2016   HDL 57 12/27/2016   LDLCALC 155 (H) 12/27/2016   TRIG 142 12/27/2016   CHOLHDL 4.2 12/27/2016      Also, the patient has history of Morbid Obesity (BMI 32+) and is screened proactively for PreDiabetes and has had no symptoms of reactive hypoglycemia, diabetic polys, paresthesias or visual blurring.  Last A1c was at goal: Lab Results  Component Value Date   HGBA1C 5.5 12/27/2016     Patient has hx/o Graves' Disease (08/2015) and was  treated with I-131 and started on thyroid replacement in Feb 2017.  Further, the patient also has history of Vitamin D Deficiency ("7" in 11/2014) and supplements vitamin D without any suspected side-effects. Last vitamin D was near goal (70-100): Lab Results  Component Value Date   VD25OH 57 12/27/2016   Current Outpatient Prescriptions on File Prior to Visit  Medication Sig  . ALPRAZolam  0.5 MG tablet Take 1/2 to 1 tab 3 times a day, only if needed for anxiety.  . bisoprolol-hctz (ZIAC) 10-6.25 MG tablet take 1 tab once daily  .  VITAMIN D 5000 units Take 10,000 Units  daily.  . citalopram  40 MG tablet Take 1 tablet daily for OCD  . FLONASE nasal spray instill 2 sprays into  nostrils once daily  . levothyroxine  75 MCG tablet take 1 tab every morning  . omeprazole  40 MG capsule take 1 caps once daily  . ranitidine  300 MG tablet Take 1 tab as needed for heartburn.    . Tetrahydroz-Glyc-Hyprom-PEG (VISINE MAXIMUM REDNESS RELIEF) 0.05-0.2-0.36-1 % SOLN Apply 1 drop to eye daily.  . traZODone 50 MG tablet take 1/2 to 2 tablets by mouth at bedtime if needed for sleep   Allergies  Allergen Reactions  . Tylenol [Acetaminophen]     r/t elevated LFTs   PMHx:   Past Medical History:  Diagnosis Date  . Graves disease 08/18/2015  . Hyperlipidemia   . Hypothyroidism   . Palpitations   . Vitamin D deficiency    Immunization History  Administered Date(s) Administered  . PPD Test 12/06/2015  . Td 10/16/2008   Past Surgical History:  Procedure Laterality Date  . ANTERIOR CRUCIATE LIGAMENT REPAIR  2012   FHx:    Reviewed / unchanged  SHx:    Reviewed / unchanged  Systems Review:  Constitutional: Denies fever, chills, wt changes, headaches, insomnia, fatigue, night sweats, change in appetite. Eyes: Denies redness, blurred vision, diplopia, discharge, itchy, watery eyes.  ENT: Denies discharge,  congestion, post nasal drip, epistaxis, sore throat, earache, hearing loss, dental pain, tinnitus, vertigo, sinus pain, snoring.  CV: Denies chest pain, palpitations, irregular heartbeat, syncope, dyspnea, diaphoresis, orthopnea, PND, claudication or edema. Respiratory: denies cough, dyspnea, DOE, pleurisy, hoarseness, laryngitis, wheezing.  Gastrointestinal: Denies dysphagia, odynophagia, heartburn, reflux, water brash, abdominal pain or cramps, nausea, vomiting, bloating, diarrhea, constipation, hematemesis, melena, hematochezia  or hemorrhoids. Genitourinary: Denies dysuria, frequency, urgency, nocturia, hesitancy,  discharge, hematuria or flank pain. Musculoskeletal: Denies arthralgias, myalgias, stiffness, jt. swelling, pain, limping or strain/sprain.  Skin: Denies pruritus, rash, hives, warts, acne, eczema or change in skin lesion(s). Neuro: No weakness, tremor, incoordination, spasms, paresthesia or pain. Psychiatric: Denies confusion, memory loss or sensory loss. Endo: Denies change in weight, skin or hair change.  Heme/Lymph: No excessive bleeding, bruising or enlarged lymph nodes.  Physical Exam  BP (!) 144/90   Pulse 69   Temp 97.7 F (36.5 C)   Resp 16   Ht 6\' 2"  (1.88 m)   Wt 256 lb (116.1 kg)   BMI 32.87 kg/m   Appears well nourished, well groomed  and in no distress.  Eyes: PERRLA, EOMs, conjunctiva no swelling or erythema. Sinuses: No frontal/maxillary tenderness ENT/Mouth: EAC's clear, TM's nl w/o erythema, bulging. Nares clear w/o erythema, swelling, exudates. Oropharynx clear without erythema or exudates. Oral hygiene is good. Tongue normal, non obstructing. Hearing intact.  Neck: Supple. Thyroid nl. Car 2+/2+ without bruits, nodes or JVD. Chest: Respirations nl with BS clear & equal w/o rales, rhonchi, wheezing or stridor.  Cor: Heart sounds normal w/ regular rate and rhythm without sig. murmurs, gallops, clicks or rubs. Peripheral pulses normal and equal  without edema.  Abdomen: Soft & bowel sounds normal. Non-tender w/o guarding, rebound, hernias, masses or organomegaly.  Lymphatics: Unremarkable.  Musculoskeletal: Full ROM all peripheral extremities, joint stability, 5/5 strength and normal gait.  Skin: Warm, dry without exposed rashes, lesions or ecchymosis apparent.  Neuro: Cranial nerves intact, reflexes equal bilaterally. Sensory-motor testing grossly intact. Tendon reflexes grossly intact.  Pysch: Alert & oriented x 3.  Insight and judgement nl & appropriate. No ideations.  Assessment and Plan:  1. Essential hypertension  - Continue medication, monitor blood  pressure at home.  - Continue DASH diet. Reminder to go to the ER if any CP,  SOB, nausea, dizziness, severe HA, changes vision/speech.   - CBC with Differential/Platelet - BASIC METABOLIC PANEL WITH GFR - Magnesium  2. Hyperlipidemia, mixed  - Continue diet/meds, exercise,& lifestyle modifications.  - Continue monitor periodic cholesterol/liver & renal functions   - Hepatic function panel - Lipid panel - TSH  3. Screening for diabetes mellitus  - Hemoglobin A1c - Insulin, random  4. Vitamin D deficiency  - Continue diet, exercise, lifestyle modifications.  - Monitor appropriate labs. - Continue supplementation. - VITAMIN D 25 Hydroxy   5. Postablative hypothyroidism  - TSH  6. Abnormal LFTs  - Gamma GT - Protime-INR  7. Transaminitis  - Gamma GT - Protime-INR  8. Gastroesophageal reflux disease   9. Medication management  - CBC with Differential/Platelet - BASIC METABOLIC PANEL WITH GFR - Hepatic function panel - Magnesium - Lipid panel - TSH - Hemoglobin A1c - Insulin, random - VITAMIN D 25 Hydroxy  - Gamma GT - Protime-INR       Discussed  regular exercise, BP monitoring, weight control to achieve/maintain BMI less than 25 and discussed med and SE's. Recommended labs to assess and monitor clinical status with further disposition pending results of  labs. Over 30 minutes of exam, counseling, chart review was performed.

## 2017-04-09 NOTE — Patient Instructions (Signed)

## 2017-04-10 LAB — PROTIME-INR
INR: 1
Prothrombin Time: 10.5 s (ref 9.0–11.5)

## 2017-04-10 LAB — HEMOGLOBIN A1C
Hgb A1c MFr Bld: 5.5 % (ref <5.7)
Mean Plasma Glucose: 111 mg/dL

## 2017-04-10 LAB — INSULIN, RANDOM: Insulin: 8.2 u[IU]/mL (ref 2.0–19.6)

## 2017-04-10 LAB — GAMMA GT: GGT: 761 U/L — AB (ref 3–70)

## 2017-04-10 LAB — VITAMIN D 25 HYDROXY (VIT D DEFICIENCY, FRACTURES): Vit D, 25-Hydroxy: 44 ng/mL (ref 30–100)

## 2017-04-10 LAB — MAGNESIUM: Magnesium: 2 mg/dL (ref 1.5–2.5)

## 2017-04-12 ENCOUNTER — Telehealth: Payer: Self-pay | Admitting: Gastroenterology

## 2017-04-12 NOTE — Telephone Encounter (Signed)
Kyle Gonzales, I received records from Dr. Precious Gilding office regarding this patient. 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 Given the high frequency of associated inflammatory bowel disease with PSC, it is recommended that he undergo a colonoscopy. I discussed this briefly with them at their last visit. Can you please contact him and see if they are willing to schedule this. If they have questions prior to scheduling, please let me know. Thanks

## 2017-04-13 NOTE — Telephone Encounter (Signed)
Left message for patient to call office.  

## 2017-04-16 ENCOUNTER — Encounter: Payer: Self-pay | Admitting: Gastroenterology

## 2017-04-16 NOTE — Telephone Encounter (Signed)
Spoke to patient's mother, let her know of the plan and recommendations. She will discuss with [name] and let us know about scheduling colonoscopy.

## 2017-04-27 ENCOUNTER — Other Ambulatory Visit: Payer: Self-pay | Admitting: Physician Assistant

## 2017-05-10 ENCOUNTER — Other Ambulatory Visit: Payer: Self-pay | Admitting: Internal Medicine

## 2017-05-10 NOTE — Telephone Encounter (Signed)
XANAX WAS CALLED INTO PHARMACY ON July 26TH 2018 at 4:13PM BY DD

## 2017-05-22 ENCOUNTER — Emergency Department (HOSPITAL_BASED_OUTPATIENT_CLINIC_OR_DEPARTMENT_OTHER)
Admission: EM | Admit: 2017-05-22 | Discharge: 2017-05-23 | Disposition: A | Discharge status: Home / Self Care | Payer: 59 | Attending: Emergency Medicine | Admitting: Emergency Medicine

## 2017-05-22 ENCOUNTER — Encounter (HOSPITAL_BASED_OUTPATIENT_CLINIC_OR_DEPARTMENT_OTHER): Payer: Self-pay

## 2017-05-22 DIAGNOSIS — Z79899 Other long term (current) drug therapy: Secondary | ICD-10-CM | POA: Insufficient documentation

## 2017-05-22 DIAGNOSIS — R112 Nausea with vomiting, unspecified: Secondary | ICD-10-CM | POA: Insufficient documentation

## 2017-05-22 DIAGNOSIS — E039 Hypothyroidism, unspecified: Secondary | ICD-10-CM | POA: Diagnosis not present

## 2017-05-22 DIAGNOSIS — F17210 Nicotine dependence, cigarettes, uncomplicated: Secondary | ICD-10-CM | POA: Diagnosis not present

## 2017-05-22 HISTORY — DX: Hypothyroidism, unspecified: E03.9

## 2017-05-22 LAB — CBC WITH DIFFERENTIAL/PLATELET
Basophils Absolute: 0 10*3/uL (ref 0.0–0.1)
Basophils Relative: 0 %
EOS ABS: 0 10*3/uL (ref 0.0–0.7)
Eosinophils Relative: 0 %
HEMATOCRIT: 41.7 % (ref 39.0–52.0)
HEMOGLOBIN: 14.3 g/dL (ref 13.0–17.0)
LYMPHS ABS: 0.7 10*3/uL (ref 0.7–4.0)
Lymphocytes Relative: 7 %
MCH: 29.2 pg (ref 26.0–34.0)
MCHC: 34.3 g/dL (ref 30.0–36.0)
MCV: 85.3 fL (ref 78.0–100.0)
MONOS PCT: 7 %
Monocytes Absolute: 0.7 10*3/uL (ref 0.1–1.0)
NEUTROS ABS: 8.7 10*3/uL — AB (ref 1.7–7.7)
NEUTROS PCT: 86 %
Platelets: 125 10*3/uL — ABNORMAL LOW (ref 150–400)
RBC: 4.89 MIL/uL (ref 4.22–5.81)
RDW: 13.9 % (ref 11.5–15.5)
WBC: 10 10*3/uL (ref 4.0–10.5)

## 2017-05-22 LAB — COMPREHENSIVE METABOLIC PANEL
ALBUMIN: 4.7 g/dL (ref 3.5–5.0)
ALK PHOS: 107 U/L (ref 38–126)
ALT: 154 U/L — ABNORMAL HIGH (ref 17–63)
ANION GAP: 14 (ref 5–15)
AST: 55 U/L — ABNORMAL HIGH (ref 15–41)
BILIRUBIN TOTAL: 1.1 mg/dL (ref 0.3–1.2)
BUN: 9 mg/dL (ref 6–20)
CALCIUM: 9.9 mg/dL (ref 8.9–10.3)
CO2: 20 mmol/L — ABNORMAL LOW (ref 22–32)
Chloride: 102 mmol/L (ref 101–111)
Creatinine, Ser: 0.95 mg/dL (ref 0.61–1.24)
GFR calc Af Amer: >60 mL/min (ref >60)
GFR calc non Af Amer: >60 mL/min (ref >60)
GLUCOSE: 127 mg/dL — AB (ref 65–99)
Potassium: 3.3 mmol/L — ABNORMAL LOW (ref 3.5–5.1)
Sodium: 136 mmol/L (ref 135–145)
TOTAL PROTEIN: 8.2 g/dL — AB (ref 6.5–8.1)

## 2017-05-22 MED ORDER — SODIUM CHLORIDE 0.9 % IV BOLUS (SEPSIS)
1000.0000 mL | Freq: Once | INTRAVENOUS | Status: AC
  Administered 2017-05-22: 1000 mL via INTRAVENOUS

## 2017-05-22 MED ORDER — PROMETHAZINE HCL 25 MG/ML IJ SOLN: 12.5000 mg | Freq: Once | INTRAMUSCULAR | Status: DC

## 2017-05-22 MED ORDER — HALOPERIDOL LACTATE 5 MG/ML IJ SOLN
5.0000 mg | Freq: Once | INTRAMUSCULAR | Status: AC
  Administered 2017-05-22: 5 mg via INTRAVENOUS
  Filled 2017-05-22: qty 1

## 2017-05-22 MED ORDER — ONDANSETRON HCL 4 MG/2ML IJ SOLN
4.0000 mg | Freq: Once | INTRAMUSCULAR | Status: AC
  Administered 2017-05-22: 4 mg via INTRAVENOUS
  Filled 2017-05-22: qty 2

## 2017-05-22 NOTE — ED Triage Notes (Signed)
C/o vomiting x today-NAD-steady gait

## 2017-05-22 NOTE — ED Provider Notes (Signed)
[place] [facility] Provider Note   CSN: [id number] Arrival date & time: 05/22/17  1929     History   Chief Complaint Chief Complaint  Patient presents with  . Emesis    HPI   Kyle Gonzales is a 26 y.o. male who presents to ED with c/o vomiting. Started suddenly this morning, ~10 episodes today, NBNB. Feeling nauseous. Ate some greasy noodles that he thinks may be contributing. No fever/chills. No diarrhea. No sick contacts. No abdominal pain. Does admit to regular marijuana use. States this is similar to ED visit back in March.       Past Medical History:  Diagnosis Date  . Graves disease 08/18/2015  . Hyperlipidemia   . Hypothyroid   . Hypothyroidism   . Palpitations   . Vitamin D deficiency     Patient Active Problem List   Diagnosis Date Noted  . Transaminitis 04/08/2017  . Abnormal LFTs 04/08/2017  . Drug addiction / Marajuana ([place]) 01/06/2017  . Hyperlipidemia, mixed 01/11/2016  . Medication management 01/11/2016  . Palpitations 12/04/2015  . Hypothyroidism 12/04/2015  . Graves disease 08/18/2015  . Vitamin D deficiency 08/11/2015    Past Surgical History:  Procedure Laterality Date  . ANTERIOR CRUCIATE LIGAMENT REPAIR  2012       Home Medications    Prior to Admission medications   Medication Sig Start Date End Date Taking? Authorizing Provider  bisoprolol-hydrochlorothiazide [facility]) 10-6.25 MG tablet take 1 tablet by mouth once daily 02/15/17   [name], MD  Cholecalciferol (VITAMIN D3) 5000 units CAPS Take 10,000 Units by mouth daily.    [provider]  citalopram (CELEXA) 40 MG tablet Take 1 tablet daily for OCD 12/27/16 06/29/17  [name], MD  fluticasone [facility]) 50 MCG/ACT nasal spray instill 2 sprays into each nostril once daily 05/13/16   [name], MD  levothyroxine (SYNTHROID, LEVOTHROID) 75 MCG tablet take 1 tablet by mouth every morning ON AN EMPTY STOMACH 04/27/17   [name], MD  omeprazole  (PRILOSEC) 40 MG capsule take 1 capsule by mouth once daily 03/19/17   Esterwood, Amy S, PA-C  ondansetron (ZOFRAN) 4 MG tablet  01/09/17   [provider]  ranitidine (ZANTAC) 300 MG tablet Take 1 tablet as needed for heartburn.  Do not exceed 2 tablets in 24 hours. 04/12/16 04/12/17  Forcucci, Courtney, PA-C  Tetrahydroz-Glyc-Hyprom-PEG (VISINE MAXIMUM REDNESS RELIEF) 0.05-0.2-0.36-1 % SOLN Apply 1 drop to eye daily.    [provider]    Family History Family History  Problem Relation Age of Onset  . Diabetes Mother   . Colon cancer Maternal Grandmother   . Stomach cancer Maternal Grandmother   . Diabetes Maternal Grandmother   . Stomach cancer Maternal Grandfather   . Diabetes Maternal Grandfather     Social History Social History  Substance Use Topics  . Smoking status: Current Some Day Smoker  . Smokeless tobacco: Never Used     Comment: Marijuana  . Alcohol use No     Allergies   Tylenol [acetaminophen]   Review of Systems Review of Systems  Constitutional: Negative for chills and fever.  HENT: Negative for congestion, postnasal drip, rhinorrhea, sneezing, sore throat and trouble swallowing.   Respiratory: Negative for chest tightness, shortness of breath and wheezing.   Cardiovascular: Negative for chest pain.  Gastrointestinal: Positive for nausea and vomiting. Negative for abdominal distention, abdominal pain, constipation and diarrhea.  Genitourinary: Negative for difficulty urinating, dysuria, frequency and urgency.  Musculoskeletal: Negative for arthralgias  and myalgias.  Neurological: Negative for dizziness, weakness and light-headedness.     Physical Exam Updated Vital Signs BP 135/90 (BP Location: Left Arm)   Pulse 84   Temp 98 F (36.7 C) (Oral)   Resp 20   Ht 6\' 3"  (1.905 m)   Wt 110.7 kg (244 lb)   SpO2 98%   BMI 30.50 kg/m   Physical Exam  Constitutional: He is oriented to person, place, and time. He appears well-developed and  well-nourished. No distress.  HENT:  Head: Normocephalic and atraumatic.  Nose: Nose normal.  Mouth/Throat: Oropharynx is clear and moist.  Eyes: Pupils are equal, round, and reactive to light. Conjunctivae and EOM are normal.  Neck: Normal range of motion. Neck supple.  Cardiovascular: Normal rate, regular rhythm and normal heart sounds.   No murmur heard. Pulmonary/Chest: Effort normal and breath sounds normal. He has no wheezes.  Abdominal: Soft. Bowel sounds are normal. He exhibits no distension. There is no tenderness. There is no rebound and no guarding.  Musculoskeletal: Normal range of motion. He exhibits no edema.  Lymphadenopathy:    He has no cervical adenopathy.  Neurological: He is alert and oriented to person, place, and time.  Skin: Skin is warm and dry. Capillary refill takes less than 2 seconds. He is not diaphoretic.  Psychiatric: He has a normal mood and affect.     ED Treatments / Results  Labs (all labs ordered are listed, but only abnormal results are displayed) Labs Reviewed  CBC WITH DIFFERENTIAL/PLATELET - Abnormal; Notable for the following:       Result Value   Platelets 125 (*)    Neutro Abs 8.7 (*)    All other components within normal limits  COMPREHENSIVE METABOLIC PANEL - Abnormal; Notable for the following:    Potassium 3.3 (*)    CO2 20 (*)    Glucose, Bld 127 (*)    Total Protein 8.2 (*)    AST 55 (*)    ALT 154 (*)    All other components within normal limits    EKG  EKG Interpretation None       Radiology No results found.  Procedures Procedures (including critical care time)  Medications Ordered in ED Medications  sodium chloride 0.9 % bolus 1,000 mL (1,000 mLs Intravenous New Bag/Given 05/22/17 2058)  ondansetron (ZOFRAN) injection 4 mg (4 mg Intravenous Given 05/22/17 2113)     Initial Impression / Assessment and Plan / ED Course  I have reviewed the triage vital signs and the nursing notes.  Pertinent labs & imaging  results that were available during my care of the patient were reviewed by me and considered in my medical decision making (see chart for details).   Patient with nausea and vomiting in the setting of regular marijuana use. Vitals stable. Improved with IV hydration and antiemetics. Is tolerating po well. Has no abdominal pain and exam is benign.  Final Clinical Impressions(s) / ED Diagnoses   Final diagnoses:  Non-intractable vomiting with nausea, unspecified vomiting type    New Prescriptions New Prescriptions   No medications on file     Bufford Lope, DO 05/23/17 8127    Gareth Morgan, MD 05/26/17 2212

## 2017-05-23 NOTE — Discharge Instructions (Signed)
You were evaluated for nausea and vomiting. After receiving anti-nausea medications and IV fluids, you improved to the point that we think it is safe for you to go home. Please keep well hydrated at home and take zofran as needed for nausea. Please follow up with your primary care doctor and your gastroenterologist as planned.

## 2017-05-23 NOTE — ED Notes (Signed)
ED Provider at bedside. 

## 2017-06-04 ENCOUNTER — Other Ambulatory Visit: Payer: Self-pay | Admitting: Physician Assistant

## 2017-06-11 ENCOUNTER — Telehealth: Payer: Self-pay | Admitting: *Deleted

## 2017-06-11 NOTE — Telephone Encounter (Signed)
Patient no show pre-visit today. Called patient at # listed in chart. No answer, left message for him to call us back before 5 pm today to reschedule pre-visit.

## 2017-06-13 ENCOUNTER — Ambulatory Visit (AMBULATORY_SURGERY_CENTER): Payer: Self-pay

## 2017-06-13 VITALS — Ht 75.0 in | Wt 252.8 lb

## 2017-06-13 DIAGNOSIS — K8301 Primary sclerosing cholangitis: Secondary | ICD-10-CM

## 2017-06-13 DIAGNOSIS — K830 Cholangitis: Secondary | ICD-10-CM

## 2017-06-13 MED ORDER — SUPREP BOWEL PREP KIT 17.5-3.13-1.6 GM/177ML PO SOLN: 1.0000 | Freq: Once | ORAL | 0 refills | Status: AC

## 2017-06-13 NOTE — Progress Notes (Signed)
No allergies to eggs or soy No diet meds No home oxygen No past problems with anesthesia  Registered emmi 

## 2017-06-14 ENCOUNTER — Encounter: Payer: Self-pay | Admitting: Gastroenterology

## 2017-06-25 ENCOUNTER — Ambulatory Visit (AMBULATORY_SURGERY_CENTER): Payer: 59 | Admitting: Gastroenterology

## 2017-06-25 ENCOUNTER — Encounter: Payer: Self-pay | Admitting: Gastroenterology

## 2017-06-25 VITALS — BP 99/68 | HR 61 | Temp 97.7°F | Resp 12 | Ht 74.0 in | Wt 256.0 lb

## 2017-06-25 DIAGNOSIS — K8301 Primary sclerosing cholangitis: Secondary | ICD-10-CM

## 2017-06-25 DIAGNOSIS — K830 Cholangitis: Secondary | ICD-10-CM

## 2017-06-25 DIAGNOSIS — K599 Functional intestinal disorder, unspecified: Secondary | ICD-10-CM | POA: Diagnosis not present

## 2017-06-25 DIAGNOSIS — K8309 Other cholangitis: Secondary | ICD-10-CM

## 2017-06-25 MED ORDER — SODIUM CHLORIDE 0.9 % IV SOLN: 500.0000 mL | INTRAVENOUS | Status: DC

## 2017-06-25 NOTE — Op Note (Signed)
[place] Patient Name: Norah Fick Procedure Date: 06/25/2017 11:13 AM MRN: [id number] Endoscopist: Remo Lipps P. 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 The patient's tolerance of                            previous anesthesia was also reviewed. The risks                            and benefits of the procedure and the sedation                            options and risks were discussed with the patient.                            All questions were answered, and informed consent                            was obtained. Prior Anticoagulants: The patient has                            taken no previous anticoagulant or antiplatelet                            agents. ASA Grade Assessment: II - A patient with                            mild systemic disease. After reviewing the risks                            and benefits, the patient was deemed in                            satisfactory condition to undergo the procedure.  After obtaining informed consent, the colonoscope                            was passed under direct vision. Throughout the                            procedure, the patient's blood pressure, pulse, and                            oxygen saturations were monitored continuously. The                            Colonoscope was introduced through the anus and                            advanced to the  the terminal ileum, with                            identification of the appendiceal orifice and IC                            valve. The colonoscopy was performed without                            difficulty. The patient tolerated the procedure                            well. The quality of the bowel preparation was                            good. The terminal ileum, ileocecal valve,                            appendiceal orifice, and rectum were photographed. Scope In: 11:22:06 AM Scope Out: 11:33:30 AM Scope Withdrawal Time: 0 hours 9 minutes 50 seconds  Total Procedure Duration: 0 hours 11 minutes 24 seconds  Findings:                 The perianal and digital rectal examinations were                            normal.                           The terminal ileum appeared normal.                           The exam was otherwise without abnormality on                            direct and retroflexion views. No inflammatory                            changes appreciated.  Biopsies for histology were taken with a cold                            forceps from the right colon, left colon and                            transverse colon for evaluation for colitis. Complications:            No immediate complications. Estimated blood loss:                            Minimal. Estimated Blood Loss:     Estimated blood loss was minimal. Impression:               - The examined portion of the ileum was normal.                           - The examination was otherwise normal on direct                            and retroflexion views.                           - Biopsies were taken with a cold forceps from the                            right colon, left colon and transverse colon for                            evaluation of microscopic colitis. Recommendation:           - Patient has a contact number available for                            emergencies. The signs and  symptoms of potential                            delayed complications were discussed with the                            patient. Return to normal activities tomorrow.                            Written discharge instructions were provided to the                            patient.                           - Resume previous diet.                           - Continue present medications.                           - Await pathology results. Remo Lipps P. Tamzin Bertling MD,  MD 06/25/2017 11:39:32 AM This report has been signed electronically.

## 2017-06-25 NOTE — Progress Notes (Signed)
Report given to PACU, vss 

## 2017-06-25 NOTE — Progress Notes (Signed)
Called to room to assist during endoscopic procedure.  Patient ID and intended procedure confirmed with present staff. Received instructions for my participation in the procedure from the performing physician.  

## 2017-06-25 NOTE — Progress Notes (Signed)
Pt's states no medical or surgical changes since previsit or office visit. 

## 2017-06-25 NOTE — Patient Instructions (Signed)
YOU HAD AN ENDOSCOPIC PROCEDURE TODAY AT [facility]:   Refer to the procedure report that was given to you for any specific questions about what was found during the examination.  If the procedure report does not answer your questions, please call your gastroenterologist to clarify.  If you requested that your care partner not be given the details of your procedure findings, then the procedure report has been included in a sealed envelope for you to review at your convenience later.  YOU SHOULD EXPECT: Some feelings of bloating in the abdomen. Passage of more gas than usual.  Walking can help get rid of the air that was put into your GI tract during the procedure and reduce the bloating. If you had a lower endoscopy (such as a colonoscopy or flexible sigmoidoscopy) you may notice spotting of blood in your stool or on the toilet paper. If you underwent a bowel prep for your procedure, you may not have a normal bowel movement for a few days.  Please Note:  You might notice some irritation and congestion in your nose or some drainage.  This is from the oxygen used during your procedure.  There is no need for concern and it should clear up in a day or so.  SYMPTOMS TO REPORT IMMEDIATELY:   Following lower endoscopy (colonoscopy or flexible sigmoidoscopy):  Excessive amounts of blood in the stool  Significant tenderness or worsening of abdominal pains  Swelling of the abdomen that is new, acute  Fever of 100F or higher   For urgent or emergent issues, a gastroenterologist can be reached at any hour by calling [phone].   DIET:  We do recommend a small meal at first, but then you may proceed to your regular diet.  Drink plenty of fluids but you should avoid alcoholic beverages for 24 hours.  ACTIVITY:  You should plan to take it easy for the rest of today and you should NOT DRIVE or use heavy machinery until tomorrow (because of the sedation medicines used during the test).     FOLLOW UP: Our staff will call the number listed on your records the next business day following your procedure to check on you and address any questions or concerns that you may have regarding the information given to you following your procedure. If we do not reach you, we will leave a message.  However, if you are feeling well and you are not experiencing any problems, there is no need to return our call.  We will assume that you have returned to your regular daily activities without incident.  If any biopsies were taken you will be contacted by phone or by letter within the next 1-3 weeks.  Please call us at [phone] if you have not heard about the biopsies in 3 weeks.    SIGNATURES/CONFIDENTIALITY: You and/or your care partner have signed paperwork which will be entered into your electronic medical record.  These signatures attest to the fact that that the information above on your After Visit Summary has been reviewed and is understood.  Full responsibility of the confidentiality of this discharge information lies with you and/or your care-partner.  Return per pathology results.

## 2017-06-26 ENCOUNTER — Telehealth: Payer: Self-pay

## 2017-06-26 NOTE — Telephone Encounter (Signed)
  Follow up Call-  Call back number 06/25/2017  Post procedure Call Back phone  # [phone]  Permission to leave phone message Yes  Some recent data might be hidden     Patient questions:  Do you have a fever, pain , or abdominal swelling? No. Pain Score  0 *  Have you tolerated food without any problems? Yes.    Have you been able to return to your normal activities? Yes.    Do you have any questions about your discharge instructions: Diet   No. Medications  No. Follow up visit  No.  Do you have questions or concerns about your Care? No.  Actions: * If pain score is 4 or above: No action needed, pain <4.

## 2017-07-02 ENCOUNTER — Encounter: Payer: Self-pay | Admitting: Gastroenterology

## 2017-07-06 ENCOUNTER — Other Ambulatory Visit: Payer: Self-pay | Admitting: Internal Medicine

## 2017-07-16 ENCOUNTER — Ambulatory Visit: Payer: Self-pay | Admitting: Internal Medicine

## 2017-07-16 NOTE — Progress Notes (Signed)
2 sd Reschedule  at appointment time

## 2017-07-18 ENCOUNTER — Other Ambulatory Visit: Payer: Self-pay | Admitting: Internal Medicine

## 2017-07-22 NOTE — Progress Notes (Signed)
2sd      R E _ S C H E D U L E  A t      t i m e     o f   O f f i c e      V i s i t

## 2017-07-23 ENCOUNTER — Ambulatory Visit: Payer: Self-pay | Admitting: Internal Medicine

## 2017-07-24 ENCOUNTER — Other Ambulatory Visit: Payer: Self-pay | Admitting: Internal Medicine

## 2017-08-11 ENCOUNTER — Other Ambulatory Visit: Payer: Self-pay | Admitting: Physician Assistant

## 2017-09-13 ENCOUNTER — Other Ambulatory Visit: Payer: Self-pay | Admitting: Internal Medicine

## 2017-09-17 ENCOUNTER — Ambulatory Visit: Payer: Self-pay | Admitting: Internal Medicine

## 2017-10-02 ENCOUNTER — Ambulatory Visit (INDEPENDENT_AMBULATORY_CARE_PROVIDER_SITE_OTHER): Payer: 59 | Admitting: Internal Medicine

## 2017-10-02 ENCOUNTER — Encounter: Payer: Self-pay | Admitting: Internal Medicine

## 2017-10-02 VITALS — BP 144/86 | HR 64 | Temp 97.1°F | Resp 18 | Ht 74.0 in | Wt 229.8 lb

## 2017-10-02 DIAGNOSIS — E782 Mixed hyperlipidemia: Secondary | ICD-10-CM | POA: Diagnosis not present

## 2017-10-02 DIAGNOSIS — Z79899 Other long term (current) drug therapy: Secondary | ICD-10-CM | POA: Diagnosis not present

## 2017-10-02 DIAGNOSIS — I10 Essential (primary) hypertension: Secondary | ICD-10-CM

## 2017-10-02 DIAGNOSIS — K219 Gastro-esophageal reflux disease without esophagitis: Secondary | ICD-10-CM

## 2017-10-02 DIAGNOSIS — R7303 Prediabetes: Secondary | ICD-10-CM

## 2017-10-02 DIAGNOSIS — K8301 Primary sclerosing cholangitis: Secondary | ICD-10-CM | POA: Diagnosis not present

## 2017-10-02 DIAGNOSIS — E559 Vitamin D deficiency, unspecified: Secondary | ICD-10-CM | POA: Diagnosis not present

## 2017-10-02 NOTE — Progress Notes (Signed)
This very nice 26 y.o. single BM  presents for  follow up with  Hypertension, Hyperlipidemia, Pre-Diabetes and Vitamin D Deficiency.  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 Patient also has GERD & has sx's of break thru dyspepsia if he forgets his meds.      Patient is treated for HTN (Feb 2016)  & today's BP is elevated at 144/86 and as usual his medication compliance is suspect. . Patient has had no complaints of any cardiac type chest pain, palpitations, dyspnea / orthopnea / PND, dizziness, claudication, or dependent edema.     Hyperlipidemia is not controlled with diet: Lab Results  Component Value Date   CHOL 233 (H) 04/09/2017   HDL 62 04/09/2017   LDLCALC 150 (H) 04/09/2017   TRIG 107 04/09/2017   CHOLHDL 3.8 04/09/2017      Also, the patient has history of Morbid Obesity (BMI 30) and is screened expectantly for PreDiabetes. He denies symptoms of reactive hypoglycemia, diabetic polys, paresthesias or visual blurring.  Last A1c was  Lab Results  Component Value Date   HGBA1C 5.5 04/09/2017      Further, the patient also has history of  Graves' Dz treated by RAI-131 in Nov 2016 and begun on thyroid replacement in Feb 2017.    He also has hx/o Vitamin D Deficiency ("7"/2016) and supplements vitamin D very sporadically.    Last vitamin D was  Still low : Lab Results  Component Value Date   VD25OH 37 04/09/2017   Current Outpatient Medications on File Prior to Visit  Medication Sig  . ALPRAZolam (XANAX) 0.5 MG tablet take 1/2 to 1 tablet by mouth if needed for anxiety (NOT DAILY)  . bisoprolol-hydrochlorothiazide (ZIAC) 10-6.25 MG tablet take 1 tablet by mouth once daily  . Cholecalciferol (VITAMIN D3) 5000 units CAPS Take 10,000 Units by mouth daily.  . citalopram (CELEXA) 40 MG tablet take 1 tablet by mouth once daily  . levothyroxine (SYNTHROID, LEVOTHROID) 75 MCG tablet  take 1 tablet  every morning ON AN EMPTY STOMACH - must have office visit before any further Refills !  . omeprazole (PRILOSEC) 40 MG capsule take 1 capsule by mouth once daily  . ranitidine (ZANTAC) 300 MG tablet Take 1 tablet as needed for heartburn.  Do not exceed 2 tablets in 24 hours.   No current facility-administered medications on file prior to visit.    Allergies  Allergen Reactions  . Tylenol [Acetaminophen]     r/t elevated LFTs   PMHx:   Past Medical History:  Diagnosis Date  . Graves disease 08/18/2015  . Hyperlipidemia   . Hypothyroid   . Hypothyroidism   . Palpitations   . Vitamin D deficiency    Immunization History  Administered Date(s) Administered  . PPD Test 12/06/2015  . Td 10/16/2008   Past Surgical History:  Procedure Laterality Date  . ANTERIOR CRUCIATE LIGAMENT REPAIR  2012  . WISDOM TOOTH EXTRACTION     FHx:    Reviewed / unchanged  SHx:    Reviewed / unchanged  Systems Review:  Constitutional: Denies fever, chills, wt changes, headaches, insomnia, fatigue, night sweats, change in appetite. Eyes: Denies redness, blurred vision, diplopia, discharge, itchy, watery eyes.  ENT: Denies discharge, congestion, post nasal drip, epistaxis, sore throat, earache, hearing loss, dental pain, tinnitus, vertigo, sinus pain, snoring.  CV: Denies chest pain, palpitations, irregular heartbeat, syncope, dyspnea, diaphoresis,  orthopnea, PND, claudication or edema. Respiratory: denies cough, dyspnea, DOE, pleurisy, hoarseness, laryngitis, wheezing.  Gastrointestinal: Denies dysphagia, odynophagia, heartburn, reflux, water brash, abdominal pain or cramps, nausea, vomiting, bloating, diarrhea, constipation, hematemesis, melena, hematochezia  or hemorrhoids. Genitourinary: Denies dysuria, frequency, urgency, nocturia, hesitancy, discharge, hematuria or flank pain. Musculoskeletal: Denies arthralgias, myalgias, stiffness, jt. swelling, pain, limping or strain/sprain.    Skin: Denies pruritus, rash, hives, warts, acne, eczema or change in skin lesion(s). Neuro: No weakness, tremor, incoordination, spasms, paresthesia or pain. Psychiatric: Denies confusion, memory loss or sensory loss. Endo: Denies change in weight, skin or hair change.  Heme/Lymph: No excessive bleeding, bruising or enlarged lymph nodes.  Physical Exam  BP (!) 144/86   Pulse 64   Temp (!) 97.1 F (36.2 C)   Resp 18   Ht 6\' 2"  (1.88 m)   Wt 229 lb 12.8 oz (104.2 kg)   BMI 29.50 kg/m   Appears well nourished, well groomed  and in no distress.  Eyes: PERRLA, EOMs, conjunctiva no swelling or erythema. Sinuses: No frontal/maxillary tenderness ENT/Mouth: EAC's clear, TM's nl w/o erythema, bulging. Nares clear w/o erythema, swelling, exudates. Oropharynx clear without erythema or exudates. Oral hygiene is good. Tongue normal, non obstructing. Hearing intact.  Neck: Supple. Thyroid nl. Car 2+/2+ without bruits, nodes or JVD. Chest: Respirations nl with BS clear & equal w/o rales, rhonchi, wheezing or stridor.  Cor: Heart sounds normal w/ regular rate and rhythm without sig. murmurs, gallops, clicks or rubs. Peripheral pulses normal and equal  without edema.  Abdomen: Soft & bowel sounds normal. Non-tender w/o guarding, rebound, hernias, masses or organomegaly.  Lymphatics: Unremarkable.  Musculoskeletal: Full ROM all peripheral extremities, joint stability, 5/5 strength and normal gait.  Skin: Warm, dry without exposed rashes, lesions or ecchymosis apparent.  Neuro: Cranial nerves intact, reflexes equal bilaterally. Sensory-motor testing grossly intact. Tendon reflexes grossly intact.  Pysch: Alert & oriented x 3.  Insight and judgement nl & appropriate. No ideations.  Assessment and Plan:  1. Essential hypertension  - Continue medication, monitor blood pressure at home.  - Continue DASH diet. Reminder to go to the ER if any CP,  SOB, nausea, dizziness, severe HA, changes  vision/speech.   - CBC with Differential/Platelet - BASIC METABOLIC PANEL WITH GFR - Magnesium - TSH  2. Hyperlipidemia, mixed  - Continue diet/meds, exercise,& lifestyle modifications.  - Continue monitor periodic cholesterol/liver & renal functions   - Hepatic function panel - Lipid panel - TSH  3. Prediabetes  - Continue diet, exercise, lifestyle modifications.  - Monitor appropriate labs. - Hemoglobin A1c - Insulin, random  4. Vitamin D deficiency  - Continue supplementation.  - VITAMIN D 25 Hydroxy  5. Primary sclerosing cholangitis  - Hepatic function panel - Gamma GT  6. Gastroesophageal reflux disease  - CBC with Differential/Platelet  7. Medication management  - CBC with Differential/Platelet - BASIC METABOLIC PANEL WITH GFR - Hepatic function panel - Magnesium - Lipid panel - TSH - Hemoglobin A1c - Insulin, random - VITAMIN D 25 Hydroxy - Gamma GT       Discussed  regular exercise, BP monitoring, weight control to achieve/maintain BMI less than 25 and discussed med and SE's. Recommended labs to assess and monitor clinical status with further disposition pending results of labs. Over 30 minutes of exam, counseling, chart review was performed.

## 2017-10-02 NOTE — Patient Instructions (Signed)

## 2017-10-03 ENCOUNTER — Other Ambulatory Visit: Payer: Self-pay | Admitting: Internal Medicine

## 2017-10-03 LAB — BASIC METABOLIC PANEL WITH GFR
BUN: 8 mg/dL (ref 7–25)
CHLORIDE: 103 mmol/L (ref 98–110)
CO2: 27 mmol/L (ref 20–32)
Calcium: 9.3 mg/dL (ref 8.6–10.3)
Creat: 0.9 mg/dL (ref 0.60–1.35)
GFR, EST AFRICAN AMERICAN: 137 mL/min/{1.73_m2} (ref >=60)
GFR, EST NON AFRICAN AMERICAN: 118 mL/min/{1.73_m2} (ref >=60)
Glucose, Bld: 96 mg/dL (ref 65–99)
POTASSIUM: 4 mmol/L (ref 3.5–5.3)
Sodium: 137 mmol/L (ref 135–146)

## 2017-10-03 LAB — CBC WITH DIFFERENTIAL/PLATELET
BASOS ABS: 9 {cells}/uL (ref 0–200)
Basophils Relative: 0.2 %
EOS ABS: 72 {cells}/uL (ref 15–500)
Eosinophils Relative: 1.6 %
HEMATOCRIT: 43.1 % (ref 38.5–50.0)
Hemoglobin: 14.4 g/dL (ref 13.2–17.1)
Lymphs Abs: 999 cells/uL (ref 850–3900)
MCH: 29.6 pg (ref 27.0–33.0)
MCHC: 33.4 g/dL (ref 32.0–36.0)
MCV: 88.5 fL (ref 80.0–100.0)
MONOS PCT: 7.2 %
MPV: 10.3 fL (ref 7.5–12.5)
NEUTROS PCT: 68.8 %
Neutro Abs: 3096 cells/uL (ref 1500–7800)
PLATELETS: 117 10*3/uL — AB (ref 140–400)
RBC: 4.87 10*6/uL (ref 4.20–5.80)
RDW: 13.7 % (ref 11.0–15.0)
TOTAL LYMPHOCYTE: 22.2 %
WBC: 4.5 10*3/uL (ref 3.8–10.8)
WBCMIX: 324 {cells}/uL (ref 200–950)

## 2017-10-03 LAB — VITAMIN D 25 HYDROXY (VIT D DEFICIENCY, FRACTURES): Vit D, 25-Hydroxy: 50 ng/mL (ref 30–100)

## 2017-10-03 LAB — HEMOGLOBIN A1C
HEMOGLOBIN A1C: 5.5 %{Hb} (ref <5.7)
Mean Plasma Glucose: 111 (calc)
eAG (mmol/L): 6.2 (calc)

## 2017-10-03 LAB — LIPID PANEL
Cholesterol: 239 mg/dL — ABNORMAL HIGH (ref <200)
HDL: 59 mg/dL (ref >40)
LDL CHOLESTEROL (CALC): 162 mg/dL — AB
NON-HDL CHOLESTEROL (CALC): 180 mg/dL — AB (ref <130)
TRIGLYCERIDES: 75 mg/dL (ref <150)
Total CHOL/HDL Ratio: 4.1 (calc) (ref <5.0)

## 2017-10-03 LAB — TSH: TSH: 1.06 m[IU]/L (ref 0.40–4.50)

## 2017-10-03 LAB — HEPATIC FUNCTION PANEL
AG Ratio: 1.6 (calc) (ref 1.0–2.5)
ALBUMIN MSPROF: 4.2 g/dL (ref 3.6–5.1)
ALT: 149 U/L — ABNORMAL HIGH (ref 9–46)
AST: 71 U/L — AB (ref 10–40)
Alkaline phosphatase (APISO): 135 U/L — ABNORMAL HIGH (ref 40–115)
BILIRUBIN DIRECT: 0.1 mg/dL (ref 0.0–0.2)
BILIRUBIN INDIRECT: 0.5 mg/dL (ref 0.2–1.2)
BILIRUBIN TOTAL: 0.6 mg/dL (ref 0.2–1.2)
Globulin: 2.6 g/dL (calc) (ref 1.9–3.7)
Total Protein: 6.8 g/dL (ref 6.1–8.1)

## 2017-10-03 LAB — INSULIN, RANDOM: Insulin: 12.5 u[IU]/mL (ref 2.0–19.6)

## 2017-10-03 LAB — MAGNESIUM: MAGNESIUM: 2 mg/dL (ref 1.5–2.5)

## 2017-10-03 LAB — GAMMA GT: GGT: 850 U/L — AB (ref 3–70)

## 2017-10-03 MED ORDER — EZETIMIBE 10 MG PO TABS: ORAL_TABLET | ORAL | 1 refills | Status: DC

## 2017-10-03 MED ORDER — LEVOTHYROXINE SODIUM 75 MCG PO TABS: ORAL_TABLET | ORAL | 0 refills | Status: DC

## 2017-10-23 ENCOUNTER — Other Ambulatory Visit: Payer: Self-pay | Admitting: Physician Assistant

## 2017-10-23 ENCOUNTER — Other Ambulatory Visit: Payer: Self-pay | Admitting: Internal Medicine

## 2017-11-01 ENCOUNTER — Ambulatory Visit: Payer: Self-pay | Admitting: Physician Assistant

## 2017-11-03 ENCOUNTER — Other Ambulatory Visit: Payer: Self-pay | Admitting: Internal Medicine

## 2017-11-05 NOTE — Progress Notes (Signed)
Assessment and Plan:  Kyle Gonzales was seen today for anxiety and erectile dysfunction.  Diagnoses and all orders for this visit:  Drug addiction / [employer] [facility]) Patient interested in discussing rehab -     Ambulatory referral to Psychiatry  Current severe episode of major depressive disorder without psychotic features, unspecified whether recurrent ([place]) Severe, ongoing, poorly controlled thus far by primary care management. Significantly limiting ability to work Has had SI, not active, without plan Depression/suicide hotline information provided, advised to present to ER for active SI/HI - denies actively at this time Due to severity and pressing nature with possible need for specialized resources will urgently refer to psych - discussed with patient and he is in agreement with this  -     Ambulatory referral to Psychiatry  Further disposition pending results of labs. Discussed med's effects and SE's.   Over 15 minutes of exam, counseling, chart review, and critical decision making was performed.   Future Appointments  Date Time Provider [place]  01/14/2018  9:00 AM [name], MD [facility] None    ------------------------------------------------------------------------------------------------------------------   HPI BP 122/74   Pulse 76   Temp 97.7 F (36.5 C)   Ht 6\' 2"  (1.88 m)   Wt 228 lb 12.8 oz (103.8 kg)   SpO2 98%   BMI 29.38 kg/m   26 y.o.male presents for concerns that his medications for anxiety and ?OCD are not controlling his symptoms. He is currently prescribed celexa 40 mg daily as well as xanax 0.5 mg - 1/2 -1 tab as needed for anxiety. He presents today describing multiple severe depressive symptoms - PHQ-9 of 19 - reporting difficulty with sleep, loss of interest, poor appetite, feelings of failure, agitation. He endorses he has had SI without specific plan intermittently in the past. 1 episode recently in this past month. Not active at this time  or this past week. 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 He worked there for 3 months.  He is currently on celexa 40 mg daily - he does see a therapist - has not seen in a while. Over 2 months - due to job and needing to be driven to appointments. Previously was seeing twice weekly. Has not seen psychiatrist. He reports he has been taking medications as prescribed, reports he has not noted significant improvement of symptoms.   He reports he is concerned about his addiction to marijuana; feels like he cannot focus or enjoy any activities. He has awareness of this, has thought about rehab but lacks motivation to pursue. His mother has been concerned about this and has commented. He reports daily marijuana use since he was 17 - progressive. He uses this to relax and help him focus- reports cannot even play video games lately without marijuana to help.   He reports rare use of ETOH; denies smoking or other recent drug use.   He is also concerned about ED - he has had 2 episodes of difficulty maintaining erection and achieving orgasm. 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 Graves disease 08/18/2015  .  Hyperlipidemia   . Hypothyroid   . Hypothyroidism   . Palpitations   . Vitamin D deficiency      Allergies  Allergen Reactions  . Tylenol [Acetaminophen]     r/t elevated LFTs    Current Outpatient Medications on File Prior to Visit  Medication Sig  . ALPRAZolam (XANAX) 0.5 MG tablet take 1/2 to 1 tablet by mouth if needed for anxiety (NOT DAILY)  .  bisoprolol-hydrochlorothiazide (ZIAC) 10-6.25 MG tablet take 1 tablet by mouth once daily  . Cholecalciferol (VITAMIN D3) 5000 units CAPS Take 10,000 Units by mouth daily.  . citalopram (CELEXA) 40 MG tablet take 1 tablet by mouth once daily  . ezetimibe (ZETIA) 10 MG tablet Take 1 tablet daily for Cholesterol  . levothyroxine (SYNTHROID, LEVOTHROID) 75 MCG tablet take 1 tablet  every morning ON AN EMPTY STOMACH  . omeprazole (PRILOSEC) 40 MG capsule take 1 capsule by mouth once daily  . ranitidine (ZANTAC) 300 MG tablet Take 1 tablet as needed for heartburn.  Do not exceed 2 tablets in 24 hours.   No current facility-administered medications on file prior to visit.     ROS: Review of Systems  Constitutional: Negative for chills, diaphoresis, fever, malaise/fatigue and weight loss.  HENT: Negative for hearing loss and tinnitus.   Eyes: Negative for blurred vision and double vision.  Respiratory: Negative for cough, shortness of breath and wheezing.   Cardiovascular: Negative for chest pain, palpitations, orthopnea, claudication and leg swelling.  Gastrointestinal: Negative for abdominal pain, blood in stool, constipation, diarrhea, heartburn, melena, nausea and vomiting.  Genitourinary: Negative.   Musculoskeletal: Negative for joint pain and myalgias.  Skin: Negative for itching and rash.  Neurological: Negative for dizziness, tingling, tremors, sensory change, focal weakness, seizures, weakness and headaches.  Endo/Heme/Allergies: Negative for polydipsia.  Psychiatric/Behavioral: Positive for depression, substance abuse and suicidal ideas (Not active, without plan). Negative for hallucinations and memory loss. The patient has insomnia.   All other systems reviewed and are negative.   Physical Exam:  BP 122/74   Pulse 76   Temp 97.7 F (36.5 C)   Ht 6\' 2"  (1.88 m)   Wt 228 lb 12.8 oz (103.8 kg)   SpO2 98%   BMI 29.38 kg/m   General Appearance: Well nourished, in no apparent  distress. Eyes: PERRLA, EOMs, conjunctiva no swelling or erythema Sinuses: No Frontal/maxillary tenderness ENT/Mouth: Ext aud canals clear, TMs without erythema, bulging. No erythema, swelling, or exudate on post pharynx.  Tonsils not swollen or erythematous. Hearing normal.  Neck: Supple, thyroid normal.  Respiratory: Respiratory effort normal, BS equal bilaterally without rales, rhonchi, wheezing or stridor.  Cardio: RRR with no MRGs. Brisk peripheral pulses without edema.  Abdomen: Soft, + BS.  Non tender, no guarding, rebound, hernias, masses. Lymphatics: Non tender without lymphadenopathy.  Musculoskeletal: Full ROM, 5/5 strength, normal gait.  Skin: Warm, dry without rashes, lesions, ecchymosis.  Neuro: Cranial nerves intact. Normal muscle tone, no cerebellar symptoms. Sensation intact.  Psych: Awake and oriented X 3, depressed affect, fidgeting throughout visit, good eye contact, cooperative, answers questions readily, Insight and Judgment appropriate.     Izora Ribas, NP 10:22 AM [facility]

## 2017-11-06 ENCOUNTER — Encounter: Payer: Self-pay | Admitting: Adult Health

## 2017-11-06 ENCOUNTER — Ambulatory Visit: Payer: 59 | Admitting: Adult Health

## 2017-11-06 VITALS — BP 122/74 | HR 76 | Temp 97.7°F | Ht 74.0 in | Wt 228.8 lb

## 2017-11-06 DIAGNOSIS — F322 Major depressive disorder, single episode, severe without psychotic features: Secondary | ICD-10-CM

## 2017-11-06 DIAGNOSIS — R45851 Suicidal ideations: Secondary | ICD-10-CM | POA: Diagnosis not present

## 2017-11-06 DIAGNOSIS — F1920 Other psychoactive substance dependence, uncomplicated: Secondary | ICD-10-CM

## 2017-11-06 NOTE — Patient Instructions (Signed)
Suicidal Feelings: How to Help Yourself  Suicide is the taking of one's own life. If you feel as though life is getting too tough to handle and are thinking about suicide, get help right away. To get help:  Call your local emergency services (911 in the [place]  Call a suicide hotline to speak with a trained counselor who understands how you are feeling. The following is a list of suicide hotlines in the [place]. For a list of hotlines in [place], visit www.suicide.org/hotlines/international/[place]-suicide-hotlines.html.  1-800-273-TALK (1-800-273-8255).  1-800-SUICIDE (1-800-784-2433).  1-888-628-9454. This is a hotline for Spanish speakers.  1-800-799-4TTY (1-800-799-4889). This is a hotline for TTY users.  1-866-4-U-TREVOR (1-866-488-7386). This is a hotline for lesbian, gay, bisexual, transgender, or questioning youth.  Contact a crisis center or a local suicide prevention center. To find a crisis center or suicide prevention center:  Call your local hospital, clinic, community service organization, mental health center, social service provider, or health department. Ask for assistance in connecting to a crisis center.  Visit www.suicidepreventionlifeline.org/getinvolved/locator for a list of crisis centers in the [place], or visit www.suicideprevention.[place]/thinking-about-suicide/find-a-crisis-centre for a list of centers in [place].  Visit the following websites:  National Suicide Prevention Lifeline: www.suicidepreventionlifeline.org  Hopeline: www.hopeline.com  American Foundation for Suicide Prevention: www.afsp.org  The Trevor Project (for lesbian, gay, bisexual, transgender, or questioning youth): www.thetrevorproject.org    How can I help myself feel better?  Promise yourself that you will not do anything drastic when you have suicidal feelings. Remember, there is hope. Many people have gotten through suicidal thoughts and feelings, and you will, too. You may have gotten through them before, and  this proves that you can get through them again.  Let family, friends, teachers, or counselors know how you are feeling. Try not to isolate yourself from those who care about you. Remember, they will want to help you. Talk with someone every day, even if you do not feel sociable. Face-to-face conversation is best.  Call a mental health professional and see one regularly.  Visit your primary health care provider every year.  Eat a well-balanced diet, and space your meals so you eat regularly.  Get plenty of rest.  Avoid alcohol and drugs, and remove them from your home. They will only make you feel worse.  If you are thinking of taking a lot of medicine, give your medicine to someone who can give it to you one day at a time. If you are on antidepressants and are concerned you will overdose, let your health care provider know so he or she can give you safer medicines. Ask your mental health professional about the possible side effects of any medicines you are taking.  Remove weapons, poisons, knives, and anything else that could harm you from your home.  Try to stick to routines. Follow a schedule every day. Put self-care on your schedule.  Make a list of realistic goals, and cross them off when you achieve them. Accomplishments give a sense of worth.  Wait until you are feeling better before doing the things you find difficult or unpleasant.  Exercise if you are able. You will feel better if you exercise for even a half hour each day.  Go out in the sun or into nature. This will help you recover from depression faster. If you have a favorite place to walk, go there.  Do the things that have always given you pleasure. Play your favorite music, read a good book, paint a picture, play your favorite   anything else that takes your mind off your depression if it is safe to do.  Keep your living space well lit.  When you are feeling well, write  yourself a letter about tips and support that you can read when you are not feeling well.  Remember that life's difficulties can be sorted out with help. Conditions can be treated. You can work on thoughts and strategies that serve you well. This information is not intended to replace advice given to you by your health care provider. Make sure you discuss any questions you have with your health care provider. Document Released: 04/08/2003 Document Revised: 05/31/2016 Document Reviewed: 01/27/2014 Elsevier Interactive Patient Education  2018 [employer].      Major Depressive Disorder, Adult Major depressive disorder (MDD) is a mental health condition. MDD often makes you feel sad, hopeless, or helpless. MDD can also cause symptoms in your body. MDD can affect your:  Work.  School.  Relationships.  Other normal activities.  MDD can range from mild to very bad. It may occur once (single episode MDD). It can also occur many times (recurrent MDD). The main symptoms of MDD often include:  Feeling sad, depressed, or irritable most of the time.  Loss of interest.  MDD symptoms also include:  Sleeping too much or too little.  Eating too much or too little.  A change in your weight.  Feeling tired (fatigue) or having low energy.  Feeling worthless.  Feeling guilty.  Trouble making decisions.  Trouble thinking clearly.  Thoughts of suicide or harming others.  Feeling weak.  Feeling agitated.  Keeping yourself from being around other people (isolation).  Follow these instructions at home: Activity  Do these things as told by your doctor: ? Go back to your normal activities. ? Exercise regularly. ? Spend time outdoors. Alcohol  Talk with your doctor about how alcohol can affect your antidepressant medicines.  Do not drink alcohol. Or, limit how much alcohol you drink. ? This means no more than 1 drink a day for nonpregnant women and 2 drinks a day for men. One  drink equals one of these:  12 oz of beer.  5 oz of wine.  1 oz of hard liquor. General instructions  Take over-the-counter and prescription medicines only as told by your doctor.  Eat a healthy diet.  Get plenty of sleep.  Find activities that you enjoy. Make time to do them.  Think about joining a support group. Your doctor may be able to suggest a group for you.  Keep all follow-up visits as told by your doctor. This is important. Where to find more information:  [employer] on Mental Illness: ? www.nami.[place]: ? https://carter.com/  National Suicide Prevention Lifeline: ? 802-635-0202. This is free, 24-hour help. Contact a doctor if:  Your symptoms get worse.  You have new symptoms. Get help right away if:  You self-harm.  You see, hear, taste, smell, or feel things that are not present (hallucinate). If you ever feel like you may hurt yourself or others, or have thoughts about taking your own life, get help right away. You can go to your nearest emergency department or call:  Your local emergency services (911 in the [place]  A suicide crisis helpline, such as the National Suicide Prevention Lifeline: ? 973-258-2259. This is open 24 hours a day.  This information is not intended to replace advice given to you by your health care provider. Make sure you  discuss any questions you have with your health care provider. Document Released: 09/13/2015 Document Revised: 06/18/2016 Document Reviewed: 06/18/2016 Elsevier Interactive Patient Education  2017 [employer].

## 2017-11-06 NOTE — Addendum Note (Signed)
Addended by: Izora Ribas on: 11/06/2017 01:17 PM   Modules accepted: Orders

## 2017-11-08 ENCOUNTER — Other Ambulatory Visit: Payer: Self-pay | Admitting: Nurse Practitioner

## 2017-11-08 DIAGNOSIS — K8301 Primary sclerosing cholangitis: Secondary | ICD-10-CM

## 2017-11-19 ENCOUNTER — Ambulatory Visit (HOSPITAL_COMMUNITY): Payer: 59 | Admitting: Psychiatry

## 2017-11-20 ENCOUNTER — Ambulatory Visit
Admission: RE | Admit: 2017-11-20 | Discharge: 2017-11-20 | Disposition: A | Discharge status: Home / Self Care | Payer: 59 | Source: Ambulatory Visit | Attending: Nurse Practitioner | Admitting: Nurse Practitioner

## 2017-11-20 DIAGNOSIS — K8301 Primary sclerosing cholangitis: Secondary | ICD-10-CM

## 2017-11-20 MED ORDER — GADOBENATE DIMEGLUMINE 529 MG/ML IV SOLN
20.0000 mL | Freq: Once | INTRAVENOUS | Status: AC | PRN
  Administered 2017-11-20: 20 mL via INTRAVENOUS

## 2017-12-04 ENCOUNTER — Ambulatory Visit (HOSPITAL_COMMUNITY): Payer: 59 | Admitting: Psychiatry

## 2017-12-19 ENCOUNTER — Other Ambulatory Visit: Payer: Self-pay | Admitting: Physician Assistant

## 2017-12-21 ENCOUNTER — Other Ambulatory Visit: Payer: Self-pay | Admitting: Internal Medicine

## 2017-12-21 ENCOUNTER — Other Ambulatory Visit: Payer: Self-pay | Admitting: Physician Assistant

## 2017-12-24 ENCOUNTER — Other Ambulatory Visit: Payer: Self-pay | Admitting: Internal Medicine

## 2017-12-28 NOTE — Progress Notes (Signed)
Psychiatric Initial Adult Assessment   Patient Identification: Kyle Gonzales MRN:  [id number] Date of Evaluation:  01/01/2018 Referral Source: Liane Comber, NP Chief Complaint:  "I want to get stable mind" Chief Complaint    Depression; Psychiatric Evaluation     Visit Diagnosis:    ICD-10-CM   1. Current moderate episode of major depressive disorder without prior episode (HCC) F32.1     History of Present Illness:   Kyle Gonzales is a 27 y.o. year old male with a history of depression, marijuana use, Hypertension, Hyperlipidemia, Pre-Diabetes and Vitamin D Deficiency, r/o PSC under evaluation, who is referred for depression.   Patient is relatively her poor historian and he does not elaborate the story. He also reports he has memory loss and he comes vague in contents at times. He states that he has been depressed,  which has been worsening for several months. He tends to stay in the house, playing video games. He "don't care" about people although he know that he should. He quit work a few weeks ago as he did not care anymore and got tired of doing the same thing. Although he may go out with his friends occasionally, he feels exhausted. He feels frustrated with "lots of stuff." He states that he was diagnosed with PSC a few years ago and was told that he may ended up needing liver transplant; otherwise he may die. He is hoping to have children in the future and wonders what happens to them. He also reports relationship issues with older woman he met once. He feels that she may not be interested in him, although she shows good caring when they talk with each other. He reports that his father left unexpectedly last year. He felt angry and surprised as there is not reason for him to leave the house. He also felt a little relieved, stating that he did not have good relationship with him (although he denies any abuse from him). Although it was stressful initially as her mother appeared to be  stressed, he believes that things has been getting better. He wants to get "sable mind" and go back to college.   he endorses insomnia.  He has fatigue.  He has difficulty in concentration.  He has appetite loss.  He feels depressed.  He has passive SI at times.  He feels anxious and tense at times.  He does not have panic attacks.  He denies decreased need for sleep or euphoria. He denies alcohol use. He uses one blunt of Marijuana, every day for recreational and mood. He states that it "makes everything more entertaining."    His mother, Kyle Gonzales presents the initial part of the interview with patient consent.  She states that she is concerned about him as he appears to be more depressed. He sleeps all the time or plays the game. He tends to stay in the house. She excused herself and left the room to allow some privacy for patient.   Wt Readings from Last 3 Encounters:  01/01/18 234 lb (106.1 kg)  11/06/17 228 lb 12.8 oz (103.8 kg)  10/02/17 229 lb 12.8 oz (104.2 kg)    Per PMP,  Xanax last filled on 05/11/2017    Associated Signs/Symptoms: Depression Symptoms:  depressed mood, anhedonia, insomnia, fatigue, (Hypo) Manic Symptoms:  denies decreased need for sleep, euphoria Anxiety Symptoms:  Excessive Worry, Psychotic Symptoms:  denies AH, VH, paranoia PTSD Symptoms: NA  Past Psychiatric History:  Outpatient: in [place] five months ago,  Psychiatry admission:  denies Previous suicide attempt:denies  Past trials of medication: fluoxetine, citalopram, Xanax History of violence: denies  Previous Psychotropic Medications: Yes   Substance Abuse History in the last 12 months:  Yes.    Consequences of Substance Abuse: Negative  Past Medical History:  Past Medical History:  Diagnosis Date  . Graves disease 08/18/2015  . Hyperlipidemia   . Hypothyroid   . Hypothyroidism   . Palpitations   . Vitamin D deficiency     Past Surgical History:  Procedure Laterality Date  .  ANTERIOR CRUCIATE LIGAMENT REPAIR  2012  . WISDOM TOOTH EXTRACTION      Family Psychiatric History:  denies  Family History:  Family History  Problem Relation Age of Onset  . Diabetes Mother   . Colon cancer Maternal Grandmother   . Stomach cancer Maternal Grandmother   . Diabetes Maternal Grandmother   . Stomach cancer Maternal Grandfather   . Diabetes Maternal Grandfather     Social History:   Social History   Socioeconomic History  . Marital status: Single    Spouse name: None  . Number of children: None  . Years of education: None  . Highest education level: None  Social Needs  . Financial resource strain: None  . Food insecurity - worry: None  . Food insecurity - inability: None  . Transportation needs - medical: None  . Transportation needs - non-medical: None  Occupational History  . None  Tobacco Use  . Smoking status: Current Some Day Smoker  . Smokeless tobacco: Never Used  . Tobacco comment: Marijuana  Substance and Sexual Activity  . Alcohol use: No  . Drug use: Yes    Types: Marijuana  . Sexual activity: None  Other Topics Concern  . None  Social History Narrative  . None    Additional Social History:  Work: Unemployed, used to work at [employer],, last two weeks Education: Comptroller from college, went to [employer] but could not finish due to transportation in 2013 He was born in [place], grew up in [place]. He has younger brother in [place]  Single, no children  Allergies:   Allergies  Allergen Reactions  . Tylenol [Acetaminophen]     r/t elevated LFTs    Metabolic Disorder Labs: Lab Results  Component Value Date   HGBA1C 5.5 10/02/2017   MPG 111 10/02/2017   MPG 111 04/09/2017   No results found for: PROLACTIN Lab Results  Component Value Date   CHOL 239 (H) 10/02/2017   TRIG 75 10/02/2017   HDL 59 10/02/2017   CHOLHDL 4.1 10/02/2017   VLDL 21 04/09/2017   LDLCALC 162 (H) 10/02/2017   LDLCALC 150 (H) 04/09/2017      Current Medications: Current Outpatient Medications  Medication Sig Dispense Refill  . bisoprolol-hydrochlorothiazide (ZIAC) 10-6.25 MG tablet take 1 tablet by mouth once daily 90 tablet 0  . Cholecalciferol (VITAMIN D3) 5000 units CAPS Take 10,000 Units by mouth daily.    . citalopram (CELEXA) 40 MG tablet take 1 tablet by mouth once daily 90 tablet 0  . ezetimibe (ZETIA) 10 MG tablet Take 1 tablet daily for Cholesterol 90 tablet 1  . levothyroxine (SYNTHROID, LEVOTHROID) 75 MCG tablet take 1 tablet by mouth every morning ON AN EMPTY STOMACH 30 tablet 0  . omeprazole (PRILOSEC) 40 MG capsule Take 1 capsule (40 mg total) by mouth daily. 30 capsule 1  . ondansetron (ZOFRAN) 4 MG tablet take 1 tablet by mouth every 6 hours 20 tablet 0  .  ALPRAZolam (XANAX) 0.5 MG tablet take 1/2 to 1 tablet by mouth if needed for anxiety (NOT DAILY)  0  . ranitidine (ZANTAC) 300 MG tablet Take 1 tablet as needed for heartburn.  Do not exceed 2 tablets in 24 hours. 90 tablet 1  . sertraline (ZOLOFT) 50 MG tablet 25 mg at night for one week, then 50 mg at night for one week, then 100 mg at night 30 tablet 1   No current facility-administered medications for this visit.     Neurologic: Headache: No Seizure: No Paresthesias:No  Musculoskeletal: Strength & Muscle Tone: within normal limits Gait & Station: normal Patient leans: N/A  Psychiatric Specialty Exam: Review of Systems  Psychiatric/Behavioral: Positive for depression and memory loss. Negative for hallucinations, substance abuse and suicidal ideas. The patient is nervous/anxious and has insomnia.   All other systems reviewed and are negative.   Blood pressure 110/69, pulse 63, height _0  (1.905 m), weight 234 lb (106.1 kg), SpO2 98 %.Body mass index is 29.25 kg/m.  General Appearance: Fairly Groomed  Eye Contact:  Good  Speech:  low in volume, increased latency  Volume:  Normal  Mood:  Depressed  Affect:  Appropriate and Blunt   Thought Process:  Coherent  Orientation:  Full (Time, Place, and Person)  Thought Content:  Logical and no paranoia  Suicidal Thoughts:  Yes.  without intent/plan  Homicidal Thoughts:  No  Memory:  Immediate;   Fair  Judgement:  Fair  Insight:  Present  Psychomotor Activity:  Decreased  Concentration:  Concentration: Poor and Attention Span: Poor  Recall:  Poor  Fund of Knowledge:Good  Language: Good  Akathisia:  No  Handed:  Right  AIMS (if indicated):  N/A  Assets:  Communication Skills Desire for Improvement  ADL's:  Intact  Cognition: WNL  Sleep:  poor   Assessment Kyle Gonzales is a 27 y.o. year old male with a history of depression, marijuana use, Hypertension, Hyperlipidemia, Graves' disease, Pre-Diabetes and Vitamin D Deficiency, r/o PSC under evaluation, who is referred for depression.   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  Psychosocial stressors including diagnosis of PSC, relationship issues and his father left the house unexpectedly last year.  Although reported fatigue and anhedonia may be multifactorial given his physical condition, he would be a good candidate for antidepressant to treat neurovegetative symptoms. Will switch from Celexa to sertraline to target depression given patient reports limited benefit from Celexa.  May consider adding low-dose of Wellbutrin to target significant fatigue in the future. Discussed behavioral activation. Explored his value of connectedness with others. He will greatly benefit from CBT; will make a referral.   # marijuana use disorder He is at precontemplative stage for marijuana use. Will continue motivational interview.    Plan 1. Decease citalopram 20 mg daily for one week, then discontinue 2. Start sertraline 25 mg at night for one week, then 50 mg at night for  one week, then 100 mg at night 3. Return to clinic in six weeks for 30 mins 4. Referral to therapy (on xanax 0.25-0.5 mg daily prn for anxiety)  The patient demonstrates the following risk factors for suicide: Chronic risk factors for suicide include: psychiatric disorder of depression and substance use disorder. Acute risk factors for suicide include: family or marital conflict and unemployment. Protective factors for this patient include: positive social  support, coping skills and hope for the future. Considering these factors, the overall suicide risk at this point appears to be low. Patient is appropriate for outpatient follow up. He denies gun access at home   Treatment Plan Summary: Plan as above   Norman Clay, MD 3/19/201910:58 AM

## 2018-01-01 ENCOUNTER — Ambulatory Visit (INDEPENDENT_AMBULATORY_CARE_PROVIDER_SITE_OTHER): Payer: 59 | Admitting: Psychiatry

## 2018-01-01 ENCOUNTER — Encounter (HOSPITAL_COMMUNITY): Payer: Self-pay | Admitting: Psychiatry

## 2018-01-01 ENCOUNTER — Encounter (INDEPENDENT_AMBULATORY_CARE_PROVIDER_SITE_OTHER): Payer: Self-pay

## 2018-01-01 VITALS — BP 110/69 | HR 63 | Ht 75.0 in | Wt 234.0 lb

## 2018-01-01 DIAGNOSIS — Z560 Unemployment, unspecified: Secondary | ICD-10-CM

## 2018-01-01 DIAGNOSIS — F321 Major depressive disorder, single episode, moderate: Secondary | ICD-10-CM | POA: Diagnosis not present

## 2018-01-01 DIAGNOSIS — F419 Anxiety disorder, unspecified: Secondary | ICD-10-CM | POA: Diagnosis not present

## 2018-01-01 DIAGNOSIS — R45851 Suicidal ideations: Secondary | ICD-10-CM | POA: Diagnosis not present

## 2018-01-01 DIAGNOSIS — R450 Nervousness: Secondary | ICD-10-CM

## 2018-01-01 DIAGNOSIS — G4700 Insomnia, unspecified: Secondary | ICD-10-CM

## 2018-01-01 MED ORDER — SERTRALINE HCL 50 MG PO TABS: ORAL_TABLET | ORAL | 1 refills | Status: DC

## 2018-01-01 NOTE — Patient Instructions (Addendum)
1. Decease citalopram 20 mg daily for one week, then discontinue 2. Start sertraline 25 mg at night for one week, then 50 mg at night for one week, then 100 mg at night 3. Return to clinic in six weeks for 30 mins 4. Referral to therapy 5. CONTACT INFORMATION  What to do if you need to get in touch with someone regarding a psychiatric issue:  1. EMERGENCY: For psychiatric emergencies (if you are suicidal or if there are any other safety issues) call 911 and/or go to your nearest Emergency Room immediately.   2. IF YOU NEED SOMEONE TO TALK TO RIGHT NOW: Given my clinical responsibilities, I may not be able to speak with you over the phone for a prolonged period of time.  a. You may always call The National Suicide Prevention Lifeline at 1-800-273-TALK 778-376-7135).  b. Your county of residence will also have local crisis services. For [facility]: [place] at [phone]

## 2018-01-04 ENCOUNTER — Other Ambulatory Visit: Payer: Self-pay | Admitting: Internal Medicine

## 2018-01-12 ENCOUNTER — Encounter: Payer: Self-pay | Admitting: Internal Medicine

## 2018-01-12 NOTE — Progress Notes (Signed)
      C  A  N  C  E  L  E  D          a  t   A  P  P  '  T           T  I  M  E                      This very nice 27 y.o. single BM presents for a Screening/Preventative Visit & comprehensive evaluation and management of multiple medical co-morbidities.  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 Patient also has GERD & has sx's of break thru dyspepsia if he forgets his meds. Patient recently seen by Dr Norman Clay for Depression about 10 days ago & is transitioned from Celexa to Zoloft.      HTN predates circa Feb 2016. Patient's BP has been controlled at home.  Today's  . Patient denies any cardiac symptoms as chest pain, palpitations, shortness of breath, dizziness or ankle swelling.     Patient's hyperlipidemia is not controlled with diet and Zetia with Statins deferred in consideration of his liver disease. Patient denies myalgias or other medication SE's. Last lipids were not at goal:  Lab Results  Component Value Date   CHOL 239 (H) 10/02/2017   HDL 59 10/02/2017   LDLCALC 162 (H) 10/02/2017   TRIG 75 10/02/2017   CHOLHDL 4.1 10/02/2017      Patient has Morbid Obesity and is followed expectantly for  Prediabetes and patient denies reactive hypoglycemic symptoms, visual blurring, diabetic polys or paresthesias. Last A1c was Normal & at goal: Lab Results  Component Value Date   HGBA1C 5.5 10/02/2017       Patient was treated Nov 2016 with RAI-131 for Grave's Dz and initiated on replacement in Feb 2016.  Finally, patient has history of Vitamin D Deficiency ("7"/2016) and last vitamin D was not at goal:                                                                                                                   Lab Results  Component Value Date   VD25OH 50 10/02/2017

## 2018-01-14 ENCOUNTER — Other Ambulatory Visit: Payer: Self-pay | Admitting: Internal Medicine

## 2018-01-14 ENCOUNTER — Ambulatory Visit: Payer: Self-pay | Admitting: Internal Medicine

## 2018-01-14 DIAGNOSIS — Z9119 Patient's noncompliance with other medical treatment and regimen: Secondary | ICD-10-CM | POA: Insufficient documentation

## 2018-01-14 DIAGNOSIS — Z5329 Procedure and treatment not carried out because of patient's decision for other reasons: Secondary | ICD-10-CM | POA: Insufficient documentation

## 2018-01-14 DIAGNOSIS — Z91199 Patient's noncompliance with other medical treatment and regimen due to unspecified reason: Secondary | ICD-10-CM | POA: Insufficient documentation

## 2018-01-18 ENCOUNTER — Other Ambulatory Visit: Payer: Self-pay | Admitting: Adult Health

## 2018-01-18 ENCOUNTER — Other Ambulatory Visit: Payer: Self-pay | Admitting: Internal Medicine

## 2018-01-20 ENCOUNTER — Other Ambulatory Visit: Payer: Self-pay | Admitting: Internal Medicine

## 2018-01-22 ENCOUNTER — Other Ambulatory Visit: Payer: Self-pay | Admitting: Internal Medicine

## 2018-01-22 MED ORDER — BISOPROLOL-HYDROCHLOROTHIAZIDE 10-6.25 MG PO TABS: 1.0000 | ORAL_TABLET | Freq: Every day | ORAL | 0 refills | Status: DC

## 2018-02-04 ENCOUNTER — Ambulatory Visit (INDEPENDENT_AMBULATORY_CARE_PROVIDER_SITE_OTHER): Payer: 59 | Admitting: Internal Medicine

## 2018-02-04 ENCOUNTER — Encounter: Payer: Self-pay | Admitting: Internal Medicine

## 2018-02-04 VITALS — BP 144/102 | HR 84 | Temp 97.3°F | Resp 18 | Ht 75.0 in | Wt 238.6 lb

## 2018-02-04 DIAGNOSIS — Z79899 Other long term (current) drug therapy: Secondary | ICD-10-CM

## 2018-02-04 DIAGNOSIS — E559 Vitamin D deficiency, unspecified: Secondary | ICD-10-CM

## 2018-02-04 DIAGNOSIS — Z0000 Encounter for general adult medical examination without abnormal findings: Secondary | ICD-10-CM | POA: Diagnosis not present

## 2018-02-04 DIAGNOSIS — R7303 Prediabetes: Secondary | ICD-10-CM

## 2018-02-04 DIAGNOSIS — Z0001 Encounter for general adult medical examination with abnormal findings: Secondary | ICD-10-CM

## 2018-02-04 DIAGNOSIS — Z111 Encounter for screening for respiratory tuberculosis: Secondary | ICD-10-CM

## 2018-02-04 DIAGNOSIS — R5383 Other fatigue: Secondary | ICD-10-CM

## 2018-02-04 DIAGNOSIS — I10 Essential (primary) hypertension: Secondary | ICD-10-CM

## 2018-02-04 DIAGNOSIS — Z1211 Encounter for screening for malignant neoplasm of colon: Secondary | ICD-10-CM

## 2018-02-04 DIAGNOSIS — E890 Postprocedural hypothyroidism: Secondary | ICD-10-CM

## 2018-02-04 DIAGNOSIS — K8301 Primary sclerosing cholangitis: Secondary | ICD-10-CM

## 2018-02-04 DIAGNOSIS — E782 Mixed hyperlipidemia: Secondary | ICD-10-CM

## 2018-02-04 DIAGNOSIS — Z1212 Encounter for screening for malignant neoplasm of rectum: Secondary | ICD-10-CM

## 2018-02-04 DIAGNOSIS — K219 Gastro-esophageal reflux disease without esophagitis: Secondary | ICD-10-CM

## 2018-02-04 NOTE — Progress Notes (Signed)
[facility]   [name], M.D.     Uvaldo Bristle. Silverio Lay, P.A.-C Liane Comber, [place]                [place] [address] Telephone [phone] Telefax [phone] Annual  Screening/Preventative Visit  & Comprehensive Evaluation & Examination     This very nice 27 y.o. single BM presents for a Screening/Preventative Visit & comprehensive evaluation and management of multiple medical co-morbidities.  Patient has been followed for HTN, HLD, Prediabetes and Vitamin D Deficiency. He also has hx/o GERD and Transaminitis suspect for PSC (Primary Sclerosing Cholangitis). Over the last year he is followed by Dr Norman Clay (Psychiatry) for Depression and 01/01/18 was advised switching his Celexa to Sertraline which he only took for a few days as it didn't help his mood in the same way that his Xanax does. 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     HTN predates since Feb 2016. Patient's BP has been controlled at home.  Today's BP was elevated at 144/102 after labs drawn and later rechecked at 138/87. Marland Kitchen Patient denies any cardiac symptoms as chest pain, palpitations, shortness of breath, dizziness or ankle swelling.     Patient's hyperlipidemia is not controlled with diet and poor compliance with medications (Zetia) . Statins are avoided due to his underlying liver disease.   Last lipids were not at goal: Lab Results  Component Value Date   CHOL 239 (H) 10/02/2017   HDL 59 10/02/2017   LDLCALC 162 (H) 10/02/2017   TRIG 75 10/02/2017   CHOLHDL 4.1 10/02/2017      Patient has Obesity (BMI 30) and is expectantly screened for prediabetes and patient denies reactive hypoglycemic symptoms, visual blurring, diabetic polys or paresthesias. Last A1c was Normal & at goal: Lab Results  Component Value Date   HGBA1C 5.5 10/02/2017        In  Nov 2016, Patient was treated with RAI-131 for Graves' Dz and started on replacement in Feb 2017.      Finally, patient has history of Vitamin D Deficiency ("7"/2016) and last vitamin D was improved:  Lab Results  Component Value Date   VD25OH 45 10/02/2017   Current Outpatient Medications on File Prior to Visit  Medication Sig  . ALPRAZolam  0.5 MG tablet take 1/2 to 1 tablet by mouth if needed for anxiety (NOT DAILY)  . bisoprolol-hctz 10-6.25  Take 1 tablet by mouth daily.  Marland Kitchen VITAMIN D 5000 units  Take 10,000 Units by mouth daily.  . citalopram  40 MG tablet TAKE 1 TABLET BY MOUTH ONCE DAILY  . ezetimibe (ZETIA) 10 MG t Take 1 tablet daily for Cholesterol  . levothyroxine  75 MCG tablet take 1 tablet by mouth every morning ON AN EMPTY STOMACH  . omeprazole  40 MG capsule Take 1 capsule (40 mg total) by mouth daily.  . ondansetron  4 MG tablet TAKE 1 TABLET BY MOUTH EVERY 6 HOURS  . ranitidine  300 MG tablet Take 1 tablet as needed for heartburn.  Do not exceed 2 tablets in 24 hours.  . sertraline (ZOLOFT) 50 MG tablet 25 mg at night for one week, then 50 mg at night for one week, then 100 mg at night (Patient not taking: Reported on 02/04/2018)  Allergies  Allergen Reactions  . Tylenol [Acetaminophen]     r/t elevated LFTs   Past Medical History:  Diagnosis Date  . Graves disease 08/18/2015  . Hyperlipidemia   . Hypothyroid   . Palpitations   . Vitamin D deficiency    Health Maintenance  Topic Date Due  . INFLUENZA VACCINE  05/16/2018  . TETANUS/TDAP  10/16/2018  . COLONOSCOPY  06/25/2020  . HIV Screening  Completed   Immunization History  Administered Date(s) Administered  . PPD Test 12/06/2015  . Td 10/16/2008   Last Colon - 9/10/2018Dr Armbruster recc 1 yr f/u   Past Surgical History:  Procedure Laterality Date  . ANTERIOR CRUCIATE LIGAMENT REPAIR  2012  . WISDOM TOOTH EXTRACTION     Family History  Problem Relation Age of Onset  . Diabetes Mother   . Colon  cancer Maternal Grandmother   . Stomach cancer Maternal Grandmother   . Diabetes Maternal Grandmother   . Stomach cancer Maternal Grandfather   . Diabetes Maternal Grandfather    Social History   Socioeconomic History  . Marital status: Single   Tobacco Use  . Smoking status: Current Some Day Smoker  . Smokeless tobacco: Never Used  . Tobacco comment: Marijuana  Substance and Sexual Activity  . Alcohol use: No  . Drug use: Yes    Types: Marijuana    ROS Constitutional: Denies fever, chills, weight loss/gain, headaches, insomnia,  night sweats or change in appetite. Does c/o fatigue. Eyes: Denies redness, blurred vision, diplopia, discharge, itchy or watery eyes.  ENT: Denies discharge, congestion, post nasal drip, epistaxis, sore throat, earache, hearing loss, dental pain, Tinnitus, Vertigo, Sinus pain or snoring.  Cardio: Denies chest pain, palpitations, irregular heartbeat, syncope, dyspnea, diaphoresis, orthopnea, PND, claudication or edema Respiratory: denies cough, dyspnea, DOE, pleurisy, hoarseness, laryngitis or wheezing.  Gastrointestinal: Denies dysphagia, heartburn, reflux, water brash, pain, cramps, nausea, vomiting, bloating, diarrhea, constipation, hematemesis, melena, hematochezia, jaundice or hemorrhoids Genitourinary: Denies dysuria, frequency, urgency, nocturia, hesitancy, discharge, hematuria or flank pain Musculoskeletal: Denies arthralgia, myalgia, stiffness, Jt. Swelling, pain, limp or strain/sprain. Denies Falls. Skin: Denies puritis, rash, hives, warts, acne, eczema or change in skin lesion Neuro: No weakness, tremor, incoordination, spasms, paresthesia or pain Psychiatric: Denies confusion, memory loss or sensory loss. Denies Depression. Endocrine: Denies change in weight, skin, hair change, nocturia, and paresthesia, diabetic polys, visual blurring or hyper / hypo glycemic episodes.  Heme/Lymph: No excessive bleeding, bruising or enlarged lymph  nodes.  Physical Exam  BP (!) 144/102   Pulse 84   Temp (!) 97.3 F (36.3 C)   Resp 18   Ht 6\' 3"  (1.905 m)   Wt 238 lb 9.6 oz (108.2 kg)   BMI 29.82 kg/m   General Appearance: Well nourished and well groomed and in no apparent distress.  Eyes: PERRLA, EOMs, conjunctiva no swelling or erythema, normal fundi and vessels. Sinuses: No frontal/maxillary tenderness ENT/Mouth: EACs patent / TMs  nl. Nares clear without erythema, swelling, mucoid exudates. Oral hygiene is good. No erythema, swelling, or exudate. Tongue normal, non-obstructing. Tonsils not swollen or erythematous. Hearing normal.  Neck: Supple, thyroid not palpable. No bruits, nodes or JVD. Respiratory: Respiratory effort normal.  BS equal and clear bilateral without rales, rhonci, wheezing or stridor. Cardio: Heart sounds are normal with regular rate and rhythm and no murmurs, rubs or gallops. Peripheral pulses are normal and equal bilaterally without edema. No aortic or femoral bruits. Chest: symmetric with normal excursions and percussion.  Abdomen: Soft, with  Nl bowel sounds. Nontender, no guarding, rebound, hernias, masses, or organomegaly.  Lymphatics: Non tender without lymphadenopathy.  Genitourinary: No hernias.Testes nl. DRE - prostate nl for age - smooth & firm w/o nodules. Musculoskeletal: Full ROM all peripheral extremities, joint stability, 5/5 strength, and normal gait. Skin: Warm and dry without rashes, lesions, cyanosis, clubbing or  ecchymosis.  Neuro: Cranial nerves intact, reflexes equal bilaterally. Normal muscle tone, no cerebellar symptoms. Sensation intact.  Pysch: Alert and oriented x 3 with indifferent affect, limited cognition, insight and judgment.   Assessment and Plan  1. Annual Preventative/Screening Exam   2. Essential hypertension  - Urinalysis, Routine w reflex microscopic - CBC with Differential/Platelet - COMPLETE METABOLIC PANEL WITH GFR - Magnesium - TSH  3. Hyperlipidemia,  mixed  - COMPLETE METABOLIC PANEL WITH GFR - Lipid panel - TSH  4. Prediabetes  - Hemoglobin A1c - Insulin, random  5. Vitamin D deficiency  - VITAMIN D 25 Hydroxyl  6. Primary sclerosing cholangitis  - COMPLETE METABOLIC PANEL WITH GFR - Gamma GT - Protime-INR  7. Gastroesophageal reflux disease  - CBC with Differential/Platelet  8. Postablative hypothyroidism  - TSH  9. Screening examination for pulmonary tuberculosis   10. Screening for colorectal cancer  - POC Hemoccult Bld/Stl   11. Fatigue  - Iron,Total/Total Iron Binding Cap - Vitamin B12 - CBC with Differential/Platelet  12. Medication management  - Urinalysis, Routine w reflex microscopic - Microalbumin / creatinine urine ratio - CBC with Differential/Platelet - COMPLETE METABOLIC PANEL WITH GFR - Magnesium - Lipid panel - TSH - Hemoglobin A1c - Insulin, random - VITAMIN D 25 Hydroxyl - Gamma GT - Protime-INR       Patient was counseled in prudent diet, weight control to achieve/maintain BMI less than 25, BP monitoring, regular exercise and medications as discussed.  Discussed med effects and SE's. Routine screening labs and tests as requested with regular follow-up as recommended. Over 40 minutes of exam, counseling, chart review and high complex critical decision making was performed

## 2018-02-04 NOTE — Patient Instructions (Signed)
Preventive Care for Adults  A healthy lifestyle and preventive care can promote health and wellness. Preventive health guidelines for men include the following key practices:  A routine yearly physical is a good way to check with your health care provider about your health and preventative screening. It is a chance to share any concerns and updates on your health and to receive a thorough exam.  Visit your dentist for a routine exam and preventative care every 6 months. Brush your teeth twice a day and floss once a day. Good oral hygiene prevents tooth decay and gum disease.  The frequency of eye exams is based on your age, health, family medical history, use of contact lenses, and other factors. Follow your health care provider's recommendations for frequency of eye exams.  Eat a healthy diet. Foods such as vegetables, fruits, whole grains, low-fat dairy products, and lean protein foods contain the nutrients you need without too many calories. Decrease your intake of foods high in solid fats, added sugars, and salt. Eat the right amount of calories for you.Get information about a proper diet from your health care provider, if necessary.  Regular physical exercise is one of the most important things you can do for your health. Most adults should get at least 150 minutes of moderate-intensity exercise (any activity that increases your heart rate and causes you to sweat) each week. In addition, most adults need muscle-strengthening exercises on 2 or more days a week.  Maintain a healthy weight. The body mass index (BMI) is a screening tool to identify possible weight problems. It provides an estimate of body fat based on height and weight. Your health care provider can find your BMI and can help you achieve or maintain a healthy weight.For adults 20 years and older:  A BMI below 18.5 is considered underweight.  A BMI of 18.5 to 24.9 is normal.  A BMI of 25 to 29.9 is considered overweight.  A  BMI of 30 and above is considered obese.  Maintain normal blood lipids and cholesterol levels by exercising and minimizing your intake of saturated fat. Eat a balanced diet with plenty of fruit and vegetables. Blood tests for lipids and cholesterol should begin at age 20 and be repeated every 5 years. If your lipid or cholesterol levels are high, you are over 50, or you are at high risk for heart disease, you may need your cholesterol levels checked more frequently.Ongoing high lipid and cholesterol levels should be treated with medicines if diet and exercise are not working.  If you smoke, find out from your health care provider how to quit. If you do not use tobacco, do not start.  Lung cancer screening is recommended for adults aged 55-80 years who are at high risk for developing lung cancer because of a history of smoking. A yearly low-dose CT scan of the lungs is recommended for people who have at least a 30-pack-year history of smoking and are a current smoker or have quit within the past 15 years. A pack year of smoking is smoking an average of 1 pack of cigarettes a day for 1 year (for example: 1 pack a day for 30 years or 2 packs a day for 15 years). Yearly screening should continue until the smoker has stopped smoking for at least 15 years. Yearly screening should be stopped for people who develop a health problem that would prevent them from having lung cancer treatment.  If you choose to drink alcohol, do not have more   than 2 drinks per day. One drink is considered to be 12 ounces (355 mL) of beer, 5 ounces (148 mL) of wine, or 1.5 ounces (44 mL) of liquor.  High blood pressure causes heart disease and increases the risk of stroke. Your blood pressure should be checked. Ongoing high blood pressure should be treated with medicines, if weight loss and exercise are not effective.  If you are 38-65 years old, ask your health care provider if you should take aspirin to prevent heart  disease.  Diabetes screening involves taking a blood sample to check your fasting blood sugar level. Testing should be considered at a younger age or be carried out more frequently if you are overweight and have at least 1 risk factor for diabetes.  Colorectal cancer can be detected and often prevented. Most routine colorectal cancer screening begins at the age of 56 and continues through age 26. However, your health care provider may recommend screening at an earlier age if you have risk factors for colon cancer. On a yearly basis, your health care provider may provide home test kits to check for hidden blood in the stool. Use of a small camera at the end of a tube to directly examine the colon (sigmoidoscopy or colonoscopy) can detect the earliest forms of colorectal cancer. Talk to your health care provider about this at age 96, when routine screening begins. Direct exam of the colon should be repeated every 5-10 years through age 69, unless early forms of precancerous polyps or small growths are found.  Screening for abdominal aortic aneurysm (AAA)  are recommended for persons over age 25 who have history of hypertensionor who are current or former smokers.  Talk with your health care provider about prostate cancer screening.  Testicular cancer screening is recommended for adult males. Screening includes self-exam, a health care provider exam, and other screening tests. Consult with your health care provider about any symptoms you have or any concerns you have about testicular cancer.  Use sunscreen. Apply sunscreen liberally and repeatedly throughout the day. You should seek shade when your shadow is shorter than you. Protect yourself by wearing long sleeves, pants, a wide-brimmed hat, and sunglasses year round, whenever you are outdoors.  Once a month, do a whole-body skin exam, using a mirror to look at the skin on your back. Tell your health care provider about new moles, moles that have  irregular borders, moles that are larger than a pencil eraser, or moles that have changed in shape or color.  Stay current with required vaccines (immunizations).  Influenza vaccine. All adults should be immunized every year.  Tetanus, diphtheria, and acellular pertussis (Td, Tdap) vaccine. An adult who has not previously received Tdap or who does not know his vaccine status should receive 1 dose of Tdap. This initial dose should be followed by tetanus and diphtheria toxoids (Td) booster doses every 10 years. Adults with an unknown or incomplete history of completing a 3-dose immunization series with Td-containing vaccines should begin or complete a primary immunization series including a Tdap dose. Adults should receive a Td booster every 10 years.  Zoster vaccine. One dose is recommended for adults aged 26 years or older unless certain conditions are present.    Pneumococcal 13-valent conjugate (PCV13) vaccine. When indicated, a person who is uncertain of his immunization history and has no record of immunization should receive the PCV13 vaccine. An adult aged 52 years or older who has certain medical conditions and has not been previously immunized  should receive 1 dose of PCV13 vaccine. This PCV13 should be followed with a dose of pneumococcal polysaccharide (PPSV23) vaccine. The PPSV23 vaccine dose should be obtained at least 8 weeks after the dose of PCV13 vaccine. An adult aged 104 years or older who has certain medical conditions and previously received 1 or more doses of PPSV23 vaccine should receive 1 dose of PCV13. The PCV13 vaccine dose should be obtained 1 or more years after the last PPSV23 vaccine dose.    Pneumococcal polysaccharide (PPSV23) vaccine. When PCV13 is also indicated, PCV13 should be obtained first. All adults aged 71 years and older should be immunized. An adult younger than age 40 years who has certain medical conditions should be immunized. Any person who resides in a  nursing home or long-term care facility should be immunized. An adult smoker should be immunized. People with an immunocompromised condition and certain other conditions should receive both PCV13 and PPSV23 vaccines. People with human immunodeficiency virus (HIV) infection should be immunized as soon as possible after diagnosis. Immunization during chemotherapy or radiation therapy should be avoided. Routine use of PPSV23 vaccine is not recommended for American Indians, [place] Natives, or people younger than 65 years unless there are medical conditions that require PPSV23 vaccine. When indicated, people who have unknown immunization and have no record of immunization should receive PPSV23 vaccine. One-time revaccination 5 years after the first dose of PPSV23 is recommended for people aged 19-64 years who have chronic kidney failure, nephrotic syndrome, asplenia, or immunocompromised conditions. People who received 1-2 doses of PPSV23 before age 82 years should receive another dose of PPSV23 vaccine at age 75 years or later if at least 5 years have passed since the previous dose. Doses of PPSV23 are not needed for people immunized with PPSV23 at or after age 49 years.  Hepatitis A vaccine. Adults who wish to be protected from this disease, have certain high-risk conditions, work with hepatitis A-infected animals, work in hepatitis A research labs, or travel to or work in countries with a high rate of hepatitis A should be immunized. Adults who were previously unvaccinated and who anticipate close contact with an international adoptee during the first 60 days after arrival in the [place] from a country with a high rate of hepatitis A should be immunized.  Hepatitis B vaccine. Adults should be immunized if they wish to be protected from this disease, have certain high-risk conditions, may be exposed to blood or other infectious body fluids, are household contacts or sex partners of hepatitis B positive  people, are clients or workers in certain care facilities, or travel to or work in countries with a high rate of hepatitis B.  Preventive Service / Frequency  Ages 59 to 45  Blood pressure check.  Lipid and cholesterol check.  Hepatitis C blood test.** / For any individual with known risks for hepatitis C.  Skin self-exam. / Monthly.  Influenza vaccine. / Every year.  Tetanus, diphtheria, and acellular pertussis (Tdap, Td) vaccine.** / Consult your health care provider. 1 dose of Td every 10 years.  HPV vaccine. / 3 doses over 6 months, if 51 or younger.  Measles, mumps, rubella (MMR) vaccine.** / You need at least 1 dose of MMR if you were born in 1957 or later. You may also need a second dose.  Pneumococcal 13-valent conjugate (PCV13) vaccine.** / Consult your health care provider.  Pneumococcal polysaccharide (PPSV23) vaccine.** / 1 to 2 doses if you smoke cigarettes or if you have  certain conditions.  Meningococcal vaccine.** / 1 dose if you are age 65 to 56 years and a Market researcher living in a residence hall, or have one of several medical conditions. You may also need additional booster doses.  Hepatitis A vaccine.** / Consult your health care provider.  Hepatitis B vaccine.** / Consult your health care provider. +++++++++ Recommend Adult Low Dose Aspirin or  coated  Aspirin 81 mg daily  To reduce risk of Colon Cancer 20 %,  Skin Cancer 26 % ,  Melanoma 46%  and  Pancreatic cancer 60% ++++++++++++++++++ Vitamin D goal  is between 70-100.  Please make sure that you are taking your Vitamin D as directed.  It is very important as a natural anti-inflammatory  helping hair, skin, and nails, as well as reducing stroke and heart attack risk.  It helps your bones and helps with mood. It also decreases numerous cancer risks so please take it as directed.  Low Vit D is associated with a 200-300% higher risk for CANCER  and 200-300% higher risk for HEART    ATTACK  &  STROKE.   .....................................Marland Kitchen It is also associated with higher death rate at younger ages,  autoimmune diseases like Rheumatoid arthritis, Lupus, Multiple Sclerosis.    Also many other serious conditions, like depression, Alzheimer's Dementia, infertility, muscle aches, fatigue, fibromyalgia - just to name a few. +++++++++++++++++++++ Recommend the book "The END of DIETING" by Dr Excell Seltzer  & the book "The END of DIABETES " by Dr Excell Seltzer At [email or web address] - get book & Audio CD's    Being diabetic has a  300% increased risk for heart attack, stroke, cancer, and alzheimer- type vascular dementia. It is very important that you work harder with diet by avoiding all foods that are white. Avoid white rice (brown & wild rice is OK), white potatoes (sweetpotatoes in moderation is OK), White bread or wheat bread or anything made out of white flour like bagels, donuts, rolls, buns, biscuits, cakes, pastries, cookies, pizza crust, and pasta (made from white flour & egg whites) - vegetarian pasta or spinach or wheat pasta is OK. Multigrain breads like Arnold's or [employer], or multigrain sandwich thins or flatbreads.  Diet, exercise and weight loss can reverse and cure diabetes in the early stages.  Diet, exercise and weight loss is very important in the control and prevention of complications of diabetes which affects every system in your body, ie. Brain - dementia/stroke, eyes - glaucoma/blindness, heart - heart attack/heart failure, kidneys - dialysis, stomach - gastric paralysis, intestines - malabsorption, nerves - severe painful neuritis, circulation - gangrene & loss of a leg(s), and finally cancer and Alzheimers.    I recommend avoid fried & greasy foods,  sweets/candy, white rice (brown or wild rice or Quinoa is OK), white potatoes (sweet potatoes are OK) - anything made from white flour - bagels, doughnuts, rolls, buns, biscuits,white and wheat breads, pizza crust  and traditional pasta made of white flour & egg white(vegetarian pasta or spinach or wheat pasta is OK).  Multi-grain bread is OK - like multi-grain flat bread or sandwich thins. Avoid alcohol in excess. Exercise is also important.    Eat all the vegetables you want - avoid meat, especially red meat and dairy - especially cheese.  Cheese is the most concentrated form of trans-fats which is the worst thing to clog up our arteries. Veggie cheese is OK which can be found in the fresh produce section at [facility] or  [employer] or [employer]  +++++++++++++++++++ DASH Eating Plan  DASH stands for "Dietary Approaches to Stop Hypertension."   The DASH eating plan is a healthy eating plan that has been shown to reduce high blood pressure (hypertension). Additional health benefits may include reducing the risk of type 2 diabetes mellitus, heart disease, and stroke. The DASH eating plan may also help with weight loss. WHAT DO I NEED TO KNOW ABOUT THE DASH EATING PLAN? For the DASH eating plan, you will follow these general guidelines:  Choose foods with a percent daily value for sodium of less than 5% (as listed on the food label).  Use salt-free seasonings or herbs instead of table salt or sea salt.  Check with your health care provider or pharmacist before using salt substitutes.  Eat lower-sodium products, often labeled as "lower sodium" or "no salt added."  Eat fresh foods.  Eat more vegetables, fruits, and low-fat dairy products.  Choose whole grains. Look for the word "whole" as the first word in the ingredient list.  Choose fish   Limit sweets, desserts, sugars, and sugary drinks.  Choose heart-healthy fats.  Eat veggie cheese   Eat more home-cooked food and less restaurant, buffet, and fast food.  Limit fried foods.  Cook foods using methods other than frying.  Limit canned vegetables. If you do use them, rinse them well to decrease the sodium.  When eating at a  restaurant, ask that your food be prepared with less salt, or no salt if possible.                      WHAT FOODS CAN I EAT? Read Dr Fara Olden Fuhrman's books on The End of Dieting & The End of Diabetes  Grains Whole grain or whole wheat bread. Brown rice. Whole grain or whole wheat pasta. Quinoa, bulgur, and whole grain cereals. Low-sodium cereals. Corn or whole wheat flour tortillas. Whole grain cornbread. Whole grain crackers. Low-sodium crackers.  Vegetables Fresh or frozen vegetables (raw, steamed, roasted, or grilled). Low-sodium or reduced-sodium tomato and vegetable juices. Low-sodium or reduced-sodium tomato sauce and paste. Low-sodium or reduced-sodium canned vegetables.   Fruits All fresh, canned (in natural juice), or frozen fruits.  Protein Products  All fish and seafood.  Dried beans, peas, or lentils. Unsalted nuts and seeds. Unsalted canned beans.  Dairy Low-fat dairy products, such as skim or 1% milk, 2% or reduced-fat cheeses, low-fat ricotta or cottage cheese, or plain low-fat yogurt. Low-sodium or reduced-sodium cheeses.  Fats and Oils Tub margarines without trans fats. Light or reduced-fat mayonnaise and salad dressings (reduced sodium). Avocado. Safflower, olive, or canola oils. Natural peanut or almond butter.  Other Unsalted popcorn and pretzels. The items listed above may not be a complete list of recommended foods or beverages. Contact your dietitian for more options.  +++++++++++++++++++  WHAT FOODS ARE NOT RECOMMENDED? Grains/ White flour or wheat flour White bread. White pasta. White rice. Refined cornbread. Bagels and croissants. Crackers that contain trans fat.  Vegetables  Creamed or fried vegetables. Vegetables in a . Regular canned vegetables. Regular canned tomato sauce and paste. Regular tomato and vegetable juices.  Fruits Dried fruits. Canned fruit in light or heavy syrup. Fruit juice.  Meat and Other Protein Products Meat in general - RED  meat & White meat.  Fatty cuts of meat. Ribs, chicken wings, all processed meats as bacon, sausage, bologna, salami, fatback, hot dogs, bratwurst and packaged luncheon meats.  Dairy Whole or 2% milk, cream,  half-and-half, and cream cheese. Whole-fat or sweetened yogurt. Full-fat cheeses or blue cheese. Non-dairy creamers and whipped toppings. Processed cheese, cheese spreads, or cheese curds.  Condiments Onion and garlic salt, seasoned salt, table salt, and sea salt. Canned and packaged gravies. Worcestershire sauce. Tartar sauce. Barbecue sauce. Teriyaki sauce. Soy sauce, including reduced sodium. Steak sauce. Fish sauce. Oyster sauce. Cocktail sauce. Horseradish. Ketchup and mustard. Meat flavorings and tenderizers. Bouillon cubes. Hot sauce. Tabasco sauce. Marinades. Taco seasonings. Relishes.  Fats and Oils Butter, stick margarine, lard, shortening and bacon fat. Coconut, palm kernel, or palm oils. Regular salad dressings.  Pickles and olives. Salted popcorn and pretzels.  The items listed above may not be a complete list of foods and beverages to avoid.    

## 2018-02-05 LAB — COMPLETE METABOLIC PANEL WITH GFR
AG Ratio: 1.9 (calc) (ref 1.0–2.5)
ALKALINE PHOSPHATASE (APISO): 138 U/L — AB (ref 40–115)
ALT: 93 U/L — AB (ref 9–46)
AST: 43 U/L — AB (ref 10–40)
Albumin: 4.7 g/dL (ref 3.6–5.1)
BUN: 8 mg/dL (ref 7–25)
CALCIUM: 9.8 mg/dL (ref 8.6–10.3)
CO2: 31 mmol/L (ref 20–32)
CREATININE: 1.03 mg/dL (ref 0.60–1.35)
Chloride: 104 mmol/L (ref 98–110)
GFR, EST NON AFRICAN AMERICAN: 100 mL/min/{1.73_m2} (ref >=60)
GFR, Est African American: 116 mL/min/{1.73_m2} (ref >=60)
GLUCOSE: 85 mg/dL (ref 65–99)
Globulin: 2.5 g/dL (calc) (ref 1.9–3.7)
Potassium: 3.6 mmol/L (ref 3.5–5.3)
SODIUM: 141 mmol/L (ref 135–146)
Total Bilirubin: 0.6 mg/dL (ref 0.2–1.2)
Total Protein: 7.2 g/dL (ref 6.1–8.1)

## 2018-02-05 LAB — CBC WITH DIFFERENTIAL/PLATELET
BASOS ABS: 22 {cells}/uL (ref 0–200)
Basophils Relative: 0.5 %
EOS PCT: 0.7 %
Eosinophils Absolute: 30 cells/uL (ref 15–500)
HEMATOCRIT: 39.1 % (ref 38.5–50.0)
Hemoglobin: 13.5 g/dL (ref 13.2–17.1)
LYMPHS ABS: 817 {cells}/uL — AB (ref 850–3900)
MCH: 30.5 pg (ref 27.0–33.0)
MCHC: 34.5 g/dL (ref 32.0–36.0)
MCV: 88.5 fL (ref 80.0–100.0)
MONOS PCT: 7 %
MPV: 11.2 fL (ref 7.5–12.5)
NEUTROS ABS: 3130 {cells}/uL (ref 1500–7800)
NEUTROS PCT: 72.8 %
Platelets: 136 10*3/uL — ABNORMAL LOW (ref 140–400)
RBC: 4.42 10*6/uL (ref 4.20–5.80)
RDW: 14.1 % (ref 11.0–15.0)
Total Lymphocyte: 19 %
WBC mixed population: 301 cells/uL (ref 200–950)
WBC: 4.3 10*3/uL (ref 3.8–10.8)

## 2018-02-05 LAB — VITAMIN B12: Vitamin B-12: 706 pg/mL (ref 200–1100)

## 2018-02-05 LAB — TSH: TSH: 3.14 mIU/L (ref 0.40–4.50)

## 2018-02-05 LAB — HEMOGLOBIN A1C
HEMOGLOBIN A1C: 5.5 %{Hb} (ref <5.7)
MEAN PLASMA GLUCOSE: 111 (calc)
eAG (mmol/L): 6.2 (calc)

## 2018-02-05 LAB — PROTIME-INR
INR: 1
Prothrombin Time: 10.1 s (ref 9.0–11.5)

## 2018-02-05 LAB — LIPID PANEL
CHOL/HDL RATIO: 3.1 (calc) (ref <5.0)
CHOLESTEROL: 183 mg/dL (ref <200)
HDL: 59 mg/dL (ref >40)
LDL CHOLESTEROL (CALC): 101 mg/dL — AB
Non-HDL Cholesterol (Calc): 124 mg/dL (calc) (ref <130)
TRIGLYCERIDES: 124 mg/dL (ref <150)

## 2018-02-05 LAB — IRON, TOTAL/TOTAL IRON BINDING CAP
%SAT: 17 % (ref 15–60)
Iron: 62 ug/dL (ref 50–195)
TIBC: 360 mcg/dL (calc) (ref 250–425)

## 2018-02-05 LAB — MAGNESIUM: MAGNESIUM: 1.9 mg/dL (ref 1.5–2.5)

## 2018-02-05 LAB — GAMMA GT: GGT: 730 U/L — ABNORMAL HIGH (ref 3–70)

## 2018-02-05 LAB — VITAMIN D 25 HYDROXY (VIT D DEFICIENCY, FRACTURES): VIT D 25 HYDROXY: 67 ng/mL (ref 30–100)

## 2018-02-08 ENCOUNTER — Other Ambulatory Visit: Payer: Self-pay | Admitting: Internal Medicine

## 2018-02-08 MED ORDER — LEVOTHYROXINE SODIUM 75 MCG PO TABS: ORAL_TABLET | ORAL | 1 refills | Status: DC

## 2018-02-13 NOTE — Progress Notes (Deleted)
BH MD/PA/NP OP Progress Note  02/13/2018 1:41 PM Kyle Gonzales  MRN:  [id number]  Chief Complaint:  HPI:  Quit taking after a few days,  Reclusive  uds  Visit Diagnosis: No diagnosis found.  Past Psychiatric History:  I have reviewed the patient's psychiatry history in detail and updated the patient record. Outpatient: in [place] five months ago,  Psychiatry admission: denies Previous suicide attempt:denies  Past trials of medication: fluoxetine, citalopram, Xanax History of violence: denies   Past Medical History:  Past Medical History:  Diagnosis Date  . Graves disease 08/18/2015  . Hyperlipidemia   . Hypothyroid   . Palpitations   . Vitamin D deficiency     Past Surgical History:  Procedure Laterality Date  . ANTERIOR CRUCIATE LIGAMENT REPAIR  2012  . WISDOM TOOTH EXTRACTION      Family Psychiatric History:  I have reviewed the patient's family history in detail and updated the patient record.  Family History:  Family History  Problem Relation Age of Onset  . Diabetes Mother   . Colon cancer Maternal Grandmother   . Stomach cancer Maternal Grandmother   . Diabetes Maternal Grandmother   . Stomach cancer Maternal Grandfather   . Diabetes Maternal Grandfather     Social History:  Social History   Socioeconomic History  . Marital status: Single    Spouse name: Not on file  . Number of children: Not on file  . Years of education: Not on file  . Highest education level: Not on file  Occupational History  . Not on file  Social Needs  . Financial resource strain: Not on file  . Food insecurity:    Worry: Not on file    Inability: Not on file  . Transportation needs:    Medical: Not on file    Non-medical: Not on file  Tobacco Use  . Smoking status: Current Some Day Smoker  . Smokeless tobacco: Never Used  . Tobacco comment: Marijuana  Substance and Sexual Activity  . Alcohol use: No  . Drug use: Yes    Types: Marijuana  . Sexual activity: Not  on file  Lifestyle  . Physical activity:    Days per week: Not on file    Minutes per session: Not on file  . Stress: Not on file  Relationships  . Social connections:    Talks on phone: Not on file    Gets together: Not on file    Attends religious service: Not on file    Active member of club or organization: Not on file    Attends meetings of clubs or organizations: Not on file    Relationship status: Not on file  Other Topics Concern  . Not on file  Social History Narrative  . Not on file    Allergies:  Allergies  Allergen Reactions  . Tylenol [Acetaminophen]     r/t elevated LFTs    Metabolic Disorder Labs: Lab Results  Component Value Date   HGBA1C 5.5 02/04/2018   MPG 111 02/04/2018   MPG 111 10/02/2017   No results found for: PROLACTIN Lab Results  Component Value Date   CHOL 183 02/04/2018   TRIG 124 02/04/2018   HDL 59 02/04/2018   CHOLHDL 3.1 02/04/2018   VLDL 21 04/09/2017   LDLCALC 101 (H) 02/04/2018   LDLCALC 162 (H) 10/02/2017   Lab Results  Component Value Date   TSH 3.14 02/04/2018   TSH 1.06 10/02/2017    Therapeutic Level Labs: No  results found for: LITHIUM No results found for: VALPROATE No components found for:  CBMZ  Current Medications: Current Outpatient Medications  Medication Sig Dispense Refill  . ALPRAZolam (XANAX) 0.5 MG tablet take 1/2 to 1 tablet by mouth if needed for anxiety (NOT DAILY)  0  . bisoprolol-hydrochlorothiazide (ZIAC) 10-6.25 MG tablet Take 1 tablet by mouth daily. 90 tablet 0  . Cholecalciferol (VITAMIN D3) 5000 units CAPS Take 10,000 Units by mouth daily.    . citalopram (CELEXA) 40 MG tablet TAKE 1 TABLET BY MOUTH ONCE DAILY 30 tablet 0  . ezetimibe (ZETIA) 10 MG tablet Take 1 tablet daily for Cholesterol 90 tablet 1  . levothyroxine (SYNTHROID, LEVOTHROID) 75 MCG tablet take 1 tablet by mouth every morning ON AN EMPTY STOMACH 90 tablet 1  . omeprazole (PRILOSEC) 40 MG capsule Take 1 capsule (40 mg total)  by mouth daily. 30 capsule 1  . ondansetron (ZOFRAN) 4 MG tablet TAKE 1 TABLET BY MOUTH EVERY 6 HOURS 20 tablet 0  . ranitidine (ZANTAC) 300 MG tablet Take 1 tablet as needed for heartburn.  Do not exceed 2 tablets in 24 hours. 90 tablet 1  . sertraline (ZOLOFT) 50 MG tablet 25 mg at night for one week, then 50 mg at night for one week, then 100 mg at night (Patient not taking: Reported on 02/04/2018) 30 tablet 1   No current facility-administered medications for this visit.      Musculoskeletal: Strength & Muscle Tone: within normal limits Gait & Station: normal Patient leans: N/A  Psychiatric Specialty Exam: ROS  There were no vitals taken for this visit.There is no height or weight on file to calculate BMI.  General Appearance: Fairly Groomed  Eye Contact:  Good  Speech:  Clear and Coherent  Volume:  Normal  Mood:  {BHH MOOD:22306}  Affect:  {Affect (PAA):22687}  Thought Process:  Coherent  Orientation:  Full (Time, Place, and Person)  Thought Content: Logical   Suicidal Thoughts:  {ST/HT (PAA):22692}  Homicidal Thoughts:  {ST/HT (PAA):22692}  Memory:  Immediate;   Good  Judgement:  {Judgement (PAA):22694}  Insight:  {Insight (PAA):22695}  Psychomotor Activity:  Normal  Concentration:  Concentration: Good and Attention Span: Good  Recall:  Good  Fund of Knowledge: Good  Language: Good  Akathisia:  No  Handed:  Right  AIMS (if indicated): not done  Assets:  Communication Skills Desire for Improvement  ADL's:  Intact  Cognition: WNL  Sleep:  {BHH GOOD/FAIR/POOR:22877}   Screenings: PHQ2-9     Office Visit from 02/04/2018 in [place] [facility] [place] Visit from 01/14/2018 in [place] [facility] [place] Office Visit from 11/06/2017 in [place] [facility] [place] Office Visit from 10/02/2017 in [place] [facility] [place] Office Visit from 04/09/2017 in [place] [facility]  PHQ-2 Total Score  0  4  4  0  0  PHQ-9 Total Score  -  8  19  -  -       Assessment and Plan:  Willliam Gonzales is a 27 y.o. year old male with a history of depression, marijuana use, Hypertension, Hyperlipidemia, Graves' disease, Pre-Diabetes and Vitamin D Deficiency, r/o PSC under evaluation, who presents for follow up appointment for No diagnosis found.  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  Psychosocial stressors including diagnosis of PSC,  relationship issues and his father left the house unexpectedly last year.  Although reported fatigue and anhedonia may be multifactorial given his physical condition, he would be a good candidate for antidepressant to treat neurovegetative symptoms. Will switch from Celexa to sertraline to target depression given patient reports limited benefit from Celexa.  May consider adding low-dose of Wellbutrin to target significant fatigue in the future. Discussed behavioral activation. Explored his value of connectedness with others. He will greatly benefit from CBT; will make a referral.   # marijuana use disorder He is at precontemplative stage for marijuana use. Will continue motivational interview.    Plan 1. Decease citalopram 20 mg daily for one week, then discontinue 2. Start sertraline 25 mg at night for one week, then 50 mg at night for one week, then 100 mg at night 3. Return to clinic in six weeks for 30 mins 4. Referral to therapy (on xanax 0.25-0.5 mg daily prn for anxiety)  The patient demonstrates the following risk factors for suicide: Chronic risk factors for suicide include: psychiatric disorder of depression and substance use disorder. Acute risk factors for suicide include: family or marital conflict and unemployment. Protective factors for this patient include:  positive social support, coping skills and hope for the future. Considering these factors, the overall suicide risk at this point appears to be low. Patient is appropriate for outpatient follow up. He denies gun access at home    Norman Clay, MD 02/13/2018, 1:41 PM

## 2018-02-15 ENCOUNTER — Ambulatory Visit (HOSPITAL_COMMUNITY): Payer: 59 | Admitting: Psychiatry

## 2018-02-17 ENCOUNTER — Other Ambulatory Visit: Payer: Self-pay | Admitting: Internal Medicine

## 2018-02-17 MED ORDER — ONDANSETRON HCL 4 MG PO TABS
4.0000 mg | ORAL_TABLET | Freq: Four times a day (QID) | ORAL | 0 refills | Status: DC
Start: 2018-02-17 — End: 2018-03-01

## 2018-02-18 ENCOUNTER — Other Ambulatory Visit: Payer: Self-pay | Admitting: *Deleted

## 2018-02-18 MED ORDER — EZETIMIBE 10 MG PO TABS: ORAL_TABLET | ORAL | 0 refills | Status: DC

## 2018-02-18 MED ORDER — CITALOPRAM HYDROBROMIDE 40 MG PO TABS: 40.0000 mg | ORAL_TABLET | Freq: Every day | ORAL | 0 refills | Status: DC

## 2018-02-21 ENCOUNTER — Ambulatory Visit (HOSPITAL_COMMUNITY): Payer: 59 | Admitting: Psychiatry

## 2018-02-25 ENCOUNTER — Emergency Department (HOSPITAL_COMMUNITY)
Admission: EM | Admit: 2018-02-25 | Discharge: 2018-02-26 | Disposition: A | Discharge status: Other Acute Inpatient Hospital | Payer: 59 | Source: Home / Self Care | Attending: Emergency Medicine | Admitting: Emergency Medicine

## 2018-02-25 DIAGNOSIS — R45851 Suicidal ideations: Secondary | ICD-10-CM

## 2018-02-25 DIAGNOSIS — F1220 Cannabis dependence, uncomplicated: Secondary | ICD-10-CM | POA: Insufficient documentation

## 2018-02-25 DIAGNOSIS — F332 Major depressive disorder, recurrent severe without psychotic features: Secondary | ICD-10-CM | POA: Insufficient documentation

## 2018-02-25 DIAGNOSIS — F17200 Nicotine dependence, unspecified, uncomplicated: Secondary | ICD-10-CM | POA: Insufficient documentation

## 2018-02-25 DIAGNOSIS — Z79899 Other long term (current) drug therapy: Secondary | ICD-10-CM | POA: Insufficient documentation

## 2018-02-25 DIAGNOSIS — E0500 Thyrotoxicosis with diffuse goiter without thyrotoxic crisis or storm: Secondary | ICD-10-CM | POA: Insufficient documentation

## 2018-02-25 DIAGNOSIS — K8301 Primary sclerosing cholangitis: Secondary | ICD-10-CM

## 2018-02-25 DIAGNOSIS — E039 Hypothyroidism, unspecified: Secondary | ICD-10-CM

## 2018-02-25 NOTE — ED Triage Notes (Signed)
Pt states he has been having suicidal ideations for a couple of months now but is scared  Pt denies having a plan at this time  Pt stopped taking citalopram and started taking zoloft about a month ago  Pt missed his last 2 appointments with his drs   Pt is calm and cooperative in triage  Mother at bedside

## 2018-02-26 ENCOUNTER — Other Ambulatory Visit: Payer: Self-pay | Admitting: Internal Medicine

## 2018-02-26 ENCOUNTER — Other Ambulatory Visit: Payer: Self-pay

## 2018-02-26 ENCOUNTER — Telehealth (HOSPITAL_COMMUNITY): Payer: Self-pay | Admitting: *Deleted

## 2018-02-26 ENCOUNTER — Inpatient Hospital Stay (HOSPITAL_COMMUNITY)
Admission: AD | Admit: 2018-02-26 | Discharge: 2018-03-01 | DRG: 885 | Disposition: A | Discharge status: Home / Self Care | Payer: 59 | Source: Intra-hospital | Attending: Psychiatry | Admitting: Psychiatry

## 2018-02-26 ENCOUNTER — Encounter (HOSPITAL_COMMUNITY): Payer: Self-pay | Admitting: Emergency Medicine

## 2018-02-26 ENCOUNTER — Encounter (HOSPITAL_COMMUNITY): Payer: Self-pay | Admitting: *Deleted

## 2018-02-26 DIAGNOSIS — Z79899 Other long term (current) drug therapy: Secondary | ICD-10-CM

## 2018-02-26 DIAGNOSIS — E782 Mixed hyperlipidemia: Secondary | ICD-10-CM | POA: Diagnosis present

## 2018-02-26 DIAGNOSIS — K769 Liver disease, unspecified: Secondary | ICD-10-CM | POA: Diagnosis present

## 2018-02-26 DIAGNOSIS — Z635 Disruption of family by separation and divorce: Secondary | ICD-10-CM | POA: Diagnosis not present

## 2018-02-26 DIAGNOSIS — K8301 Primary sclerosing cholangitis: Secondary | ICD-10-CM | POA: Diagnosis present

## 2018-02-26 DIAGNOSIS — Z79890 Hormone replacement therapy (postmenopausal): Secondary | ICD-10-CM | POA: Diagnosis not present

## 2018-02-26 DIAGNOSIS — E039 Hypothyroidism, unspecified: Secondary | ICD-10-CM | POA: Diagnosis present

## 2018-02-26 DIAGNOSIS — R45851 Suicidal ideations: Secondary | ICD-10-CM | POA: Diagnosis present

## 2018-02-26 DIAGNOSIS — F332 Major depressive disorder, recurrent severe without psychotic features: Secondary | ICD-10-CM | POA: Diagnosis present

## 2018-02-26 DIAGNOSIS — E0500 Thyrotoxicosis with diffuse goiter without thyrotoxic crisis or storm: Secondary | ICD-10-CM | POA: Diagnosis present

## 2018-02-26 DIAGNOSIS — F1290 Cannabis use, unspecified, uncomplicated: Secondary | ICD-10-CM | POA: Diagnosis not present

## 2018-02-26 DIAGNOSIS — R454 Irritability and anger: Secondary | ICD-10-CM | POA: Diagnosis not present

## 2018-02-26 DIAGNOSIS — I10 Essential (primary) hypertension: Secondary | ICD-10-CM | POA: Diagnosis present

## 2018-02-26 DIAGNOSIS — G4700 Insomnia, unspecified: Secondary | ICD-10-CM | POA: Diagnosis present

## 2018-02-26 DIAGNOSIS — Z560 Unemployment, unspecified: Secondary | ICD-10-CM | POA: Diagnosis not present

## 2018-02-26 DIAGNOSIS — E559 Vitamin D deficiency, unspecified: Secondary | ICD-10-CM | POA: Diagnosis present

## 2018-02-26 DIAGNOSIS — F339 Major depressive disorder, recurrent, unspecified: Secondary | ICD-10-CM | POA: Diagnosis present

## 2018-02-26 DIAGNOSIS — K219 Gastro-esophageal reflux disease without esophagitis: Secondary | ICD-10-CM | POA: Diagnosis present

## 2018-02-26 DIAGNOSIS — Z886 Allergy status to analgesic agent status: Secondary | ICD-10-CM | POA: Diagnosis not present

## 2018-02-26 DIAGNOSIS — F419 Anxiety disorder, unspecified: Secondary | ICD-10-CM | POA: Diagnosis present

## 2018-02-26 DIAGNOSIS — F1210 Cannabis abuse, uncomplicated: Secondary | ICD-10-CM | POA: Diagnosis not present

## 2018-02-26 DIAGNOSIS — F1099 Alcohol use, unspecified with unspecified alcohol-induced disorder: Secondary | ICD-10-CM | POA: Diagnosis not present

## 2018-02-26 DIAGNOSIS — Z720 Tobacco use: Secondary | ICD-10-CM | POA: Diagnosis not present

## 2018-02-26 DIAGNOSIS — K8309 Other cholangitis: Secondary | ICD-10-CM | POA: Diagnosis not present

## 2018-02-26 LAB — COMPREHENSIVE METABOLIC PANEL
ALBUMIN: 4.5 g/dL (ref 3.5–5.0)
ALT: 111 U/L — AB (ref 17–63)
AST: 32 U/L (ref 15–41)
Alkaline Phosphatase: 169 U/L — ABNORMAL HIGH (ref 38–126)
Anion gap: 10 (ref 5–15)
BILIRUBIN TOTAL: 0.9 mg/dL (ref 0.3–1.2)
BUN: 10 mg/dL (ref 6–20)
CHLORIDE: 103 mmol/L (ref 101–111)
CO2: 26 mmol/L (ref 22–32)
CREATININE: 1.02 mg/dL (ref 0.61–1.24)
Calcium: 10 mg/dL (ref 8.9–10.3)
GFR calc Af Amer: >60 mL/min (ref >60)
GFR calc non Af Amer: >60 mL/min (ref >60)
Glucose, Bld: 116 mg/dL — ABNORMAL HIGH (ref 65–99)
POTASSIUM: 3.6 mmol/L (ref 3.5–5.1)
Sodium: 139 mmol/L (ref 135–145)
TOTAL PROTEIN: 8.1 g/dL (ref 6.5–8.1)

## 2018-02-26 LAB — RAPID URINE DRUG SCREEN, HOSP PERFORMED
AMPHETAMINES: NOT DETECTED
BENZODIAZEPINES: NOT DETECTED
Barbiturates: NOT DETECTED
Cocaine: NOT DETECTED
Opiates: NOT DETECTED
Tetrahydrocannabinol: POSITIVE — AB

## 2018-02-26 LAB — ETHANOL: Alcohol, Ethyl (B): <10 mg/dL (ref <10)

## 2018-02-26 LAB — CBC
HCT: 41.8 % (ref 39.0–52.0)
Hemoglobin: 14.1 g/dL (ref 13.0–17.0)
MCH: 31 pg (ref 26.0–34.0)
MCHC: 33.7 g/dL (ref 30.0–36.0)
MCV: 91.9 fL (ref 78.0–100.0)
Platelets: 145 10*3/uL — ABNORMAL LOW (ref 150–400)
RBC: 4.55 MIL/uL (ref 4.22–5.81)
RDW: 14.3 % (ref 11.5–15.5)
WBC: 5.8 10*3/uL (ref 4.0–10.5)

## 2018-02-26 LAB — ACETAMINOPHEN LEVEL: Acetaminophen (Tylenol), Serum: <10 ug/mL — ABNORMAL LOW (ref 10–30)

## 2018-02-26 LAB — SALICYLATE LEVEL: Salicylate Lvl: <7 mg/dL (ref 2.8–30.0)

## 2018-02-26 MED ORDER — SERTRALINE HCL 100 MG PO TABS
100.0000 mg | ORAL_TABLET | Freq: Every day | ORAL | Status: DC
  Administered 2018-02-26 – 2018-02-28 (×3): 100 mg via ORAL
  Filled 2018-02-26 (×6): qty 1

## 2018-02-26 MED ORDER — LEVOTHYROXINE SODIUM 75 MCG PO TABS
75.0000 ug | ORAL_TABLET | Freq: Every day | ORAL | Status: DC
  Administered 2018-02-26: 75 ug via ORAL
  Filled 2018-02-26: qty 1

## 2018-02-26 MED ORDER — MAGNESIUM HYDROXIDE 400 MG/5ML PO SUSP: 30.0000 mL | Freq: Every day | ORAL | Status: DC | PRN

## 2018-02-26 MED ORDER — ALUM & MAG HYDROXIDE-SIMETH 200-200-20 MG/5ML PO SUSP: 30.0000 mL | ORAL | Status: DC | PRN

## 2018-02-26 MED ORDER — BISOPROLOL-HYDROCHLOROTHIAZIDE 10-6.25 MG PO TABS
1.0000 | ORAL_TABLET | Freq: Every day | ORAL | Status: DC
  Administered 2018-02-27 – 2018-03-01 (×3): 1 via ORAL
  Filled 2018-02-26 (×5): qty 1

## 2018-02-26 MED ORDER — FAMOTIDINE 20 MG PO TABS
20.0000 mg | ORAL_TABLET | Freq: Every day | ORAL | Status: DC
  Administered 2018-02-26: 20 mg via ORAL
  Filled 2018-02-26: qty 1

## 2018-02-26 MED ORDER — VITAMIN D3 25 MCG (1000 UNIT) PO TABS
10000.0000 [IU] | ORAL_TABLET | Freq: Every day | ORAL | Status: DC
  Administered 2018-02-26: 10000 [IU] via ORAL
  Filled 2018-02-26: qty 10

## 2018-02-26 MED ORDER — SERTRALINE HCL 50 MG PO TABS: 100.0000 mg | ORAL_TABLET | Freq: Every day | ORAL | Status: DC

## 2018-02-26 MED ORDER — ZIPRASIDONE MESYLATE 20 MG IM SOLR: 20.0000 mg | INTRAMUSCULAR | Status: DC | PRN

## 2018-02-26 MED ORDER — HYDROXYZINE HCL 25 MG PO TABS
25.0000 mg | ORAL_TABLET | Freq: Three times a day (TID) | ORAL | Status: DC | PRN
  Administered 2018-02-27 – 2018-02-28 (×2): 25 mg via ORAL
  Filled 2018-02-26 (×2): qty 1

## 2018-02-26 MED ORDER — EZETIMIBE 10 MG PO TABS
10.0000 mg | ORAL_TABLET | Freq: Every day | ORAL | Status: DC
  Administered 2018-02-27 – 2018-03-01 (×3): 10 mg via ORAL
  Filled 2018-02-26 (×6): qty 1

## 2018-02-26 MED ORDER — VITAMIN D3 25 MCG (1000 UNIT) PO TABS
10000.0000 [IU] | ORAL_TABLET | Freq: Every day | ORAL | Status: DC
  Administered 2018-02-27 – 2018-03-01 (×3): 10000 [IU] via ORAL
  Filled 2018-02-26 (×5): qty 10

## 2018-02-26 MED ORDER — BISOPROLOL-HYDROCHLOROTHIAZIDE 10-6.25 MG PO TABS
1.0000 | ORAL_TABLET | Freq: Every day | ORAL | Status: DC
  Administered 2018-02-26: 1 via ORAL
  Filled 2018-02-26: qty 1

## 2018-02-26 MED ORDER — LEVOTHYROXINE SODIUM 75 MCG PO TABS
75.0000 ug | ORAL_TABLET | Freq: Every day | ORAL | Status: DC
  Administered 2018-02-27 – 2018-03-01 (×3): 75 ug via ORAL
  Filled 2018-02-26 (×8): qty 1

## 2018-02-26 MED ORDER — LORAZEPAM 1 MG PO TABS: 1.0000 mg | ORAL_TABLET | ORAL | Status: DC | PRN

## 2018-02-26 MED ORDER — ZOLPIDEM TARTRATE 5 MG PO TABS: 5.0000 mg | ORAL_TABLET | Freq: Every evening | ORAL | Status: DC | PRN

## 2018-02-26 MED ORDER — TRAZODONE HCL 50 MG PO TABS
50.0000 mg | ORAL_TABLET | Freq: Every evening | ORAL | Status: DC | PRN
  Administered 2018-02-26 – 2018-02-28 (×3): 50 mg via ORAL
  Filled 2018-02-26 (×3): qty 1

## 2018-02-26 MED ORDER — RISPERIDONE 1 MG PO TBDP: 2.0000 mg | ORAL_TABLET | Freq: Three times a day (TID) | ORAL | Status: DC | PRN

## 2018-02-26 MED ORDER — EZETIMIBE 10 MG PO TABS
10.0000 mg | ORAL_TABLET | Freq: Every day | ORAL | Status: DC
  Administered 2018-02-26: 10 mg via ORAL
  Filled 2018-02-26: qty 1

## 2018-02-26 MED ORDER — FAMOTIDINE 20 MG PO TABS
20.0000 mg | ORAL_TABLET | Freq: Every day | ORAL | Status: DC
  Administered 2018-02-27 – 2018-03-01 (×3): 20 mg via ORAL
  Filled 2018-02-26 (×6): qty 1

## 2018-02-26 NOTE — ED Notes (Signed)
Pt's mother took his belongings home.

## 2018-02-26 NOTE — ED Notes (Signed)
Bed: [facility] Expected date:  Expected time:  Means of arrival:  Comments: Hold for triage 5

## 2018-02-26 NOTE — ED Notes (Signed)
Collected urine and called phlebotomy to collect labs.

## 2018-02-26 NOTE — ED Notes (Signed)
Report given to [employer] and [employer] called.

## 2018-02-26 NOTE — ED Notes (Signed)
Urine cup and instructions given to pt

## 2018-02-26 NOTE — Telephone Encounter (Signed)
Dr Ena Dawley [facility] for the BH-E.D.  # [phone] called to inform that  Patient is begin admitted to the adult unit [facility] # [phone]. All  Admitting notes will be under  MRN # [id number]

## 2018-02-26 NOTE — Tx Team (Signed)
Initial Treatment Plan 02/26/2018 5:10 PM Kyle Gonzales FEX:[id number]    PATIENT STRESSORS: Educational concerns Financial difficulties Health problems Marital or family conflict Occupational concerns   PATIENT STRENGTHS: Ability for insight Average or above average intelligence Capable of independent living Communication skills General fund of knowledge Supportive family/friends   PATIENT IDENTIFIED PROBLEMS: Depression Suicidal thoughts "Maybe look at my medications"                     DISCHARGE CRITERIA:  Ability to meet basic life and health needs Improved stabilization in mood, thinking, and/or behavior Reduction of life-threatening or endangering symptoms to within safe limits Verbal commitment to aftercare and medication compliance  PRELIMINARY DISCHARGE PLAN: Attend aftercare/continuing care group Return to previous living arrangement  PATIENT/FAMILY INVOLVEMENT: This treatment plan has been presented to and reviewed with the patient, Kyle Gonzales, and/or family member, .  The patient and family have been given the opportunity to ask questions and make suggestions.  [place], [place], [place] 02/26/2018, 5:10 PM

## 2018-02-26 NOTE — Telephone Encounter (Signed)
Noted. Thanks.

## 2018-02-26 NOTE — Progress Notes (Signed)
Pt currently presents with a flat affect and guarded behavior. Pt reports he is unable to identify a goal for tonight. Pt chooses not to attend group. Reports that he needs to talk to someone, asks to call mom during group time. Pt states "I just have a bunch of stuff running around in my head." Reports relationship issues with "a friend, she won't return my calls." Pt reports difficulty falling asleep prior to arrival, requests sleep aid tonight.  Pt provided with medications per providers orders. Pt's labs and vitals were monitored throughout the night. Pt given a 1:1 about emotional and mental status. Pt supported and encouraged to express concerns and questions. Encouraged pt to remain in common areas and speak with peers. Pt educated on medications, physiology of depression and assertiveness techniques.  Pt's safety ensured with 15 minute and environmental checks. Pt currently denies SI/HI and A/V hallucinations. Pt verbally agrees to seek staff if SI/HI or A/VH occurs and to consult with staff before acting on any harmful thoughts. Will continue POC.

## 2018-02-26 NOTE — ED Notes (Signed)
Blood draw attempt unsuccessful. Pt sts he is "not good" with needles and each time draws his are back.   RN notified.

## 2018-02-26 NOTE — ED Notes (Signed)
Pt is upset that he has not been seen by a provider  Pt states he wants to kill himself and no one will even come to see him  Explained to the pt that the dept was very busy and the doctor would be in as soon as he could  Pt states he will kill himself right now  Spoke with EDP

## 2018-02-26 NOTE — BHH Group Notes (Signed)
Pt did not attend wrap up group this evening. Pt stayed in their room instead 

## 2018-02-26 NOTE — ED Notes (Signed)
Pt transported to [facility] by [place] transportation. He did not have any belongings because his mother took them home. He was calm and cooperative.

## 2018-02-26 NOTE — Progress Notes (Signed)
Kyle Gonzales is a 27 year old male pt admitted on voluntary basis. On admission he spoke about how he has been feeling depressed and suicidal and reports issues with his relationship with girlfriend, parents going through a divorce, health condition he has, lack of income and not in school currently. He reports that he has a psychiatrist and a PCP and takes his medications as he should but reports he is unsure if they are helping. He does endorse regular marijuana usage but denies any other substance abuse issues. He reports that he lives at home with his mother and reports that he will return there upon discharge. [name] was oriented to the unit and safety maintained.

## 2018-02-26 NOTE — BH Assessment (Addendum)
Assessment Note  Kyle Gonzales is an 27 y.o. male. The pt came in due to having suicidal thoughts.  He denied having a plan.  The pt stated he is stressed over problems with his girlfriend and health problems (primary sclerosing Cholangitis).  He denies having any previous suicide attempts.  He goes to [facility] outpatient in [place],  He missed his last 2 appointments.  He reports he has been taking his medication as prescribed.  He has not been hospitalized for psychiatric reasons in the past.    He currently lives with his mother and reports a good relationship with her.  He is not currently working .  The pt last worked earlier this year.  He denies a history of self harm, access to guns, legal issues and abuse.  He reported he smokes half a gram of marijuana daily.  At this time, a UDS has not been completed.  The pt stated he is sleeping about 6 hours a day and has a decreased appetite.  He expressed he is often more irritable, has problems concentrating, feels bad about himself, little interest in pleasurable activities and feels hopeless.  The pt denies HI and psychosis.    Diagnosis: F33.2 Major depressive disorder, Recurrent episode, Severe  F12.20 Cannabis use disorder, Moderate   Past Medical History:  Past Medical History:  Diagnosis Date  . Graves disease 08/18/2015  . Hyperlipidemia   . Hypothyroid   . Palpitations   . Vitamin D deficiency     Past Surgical History:  Procedure Laterality Date  . ANTERIOR CRUCIATE LIGAMENT REPAIR  2012  . WISDOM TOOTH EXTRACTION      Family History:  Family History  Problem Relation Age of Onset  . Diabetes Mother   . Colon cancer Maternal Grandmother   . Stomach cancer Maternal Grandmother   . Diabetes Maternal Grandmother   . Stomach cancer Maternal Grandfather   . Diabetes Maternal Grandfather     Social History:  reports that he has been smoking.  He has never used smokeless tobacco. He reports that he has current or past  drug history. Drug: Marijuana. He reports that he does not drink alcohol.  Additional Social History:  Alcohol / Drug Use Pain Medications: See MAR Prescriptions: See MAR Over the Counter: See MAR History of alcohol / drug use?: Yes Longest period of sobriety (when/how long): NA Substance #1 Name of Substance 1: marijuana 1 - Age of First Use: 17 1 - Amount (size/oz): half a gram 1 - Frequency: daily 1 - Duration: 9 years 1 - Last Use / Amount: 02/25/2018  CIWA: CIWA-Ar BP: (!) 146/87 Pulse Rate: 84 COWS:    Allergies:  Allergies  Allergen Reactions  . Tylenol [Acetaminophen]     r/t elevated LFTs    Home Medications:  (Not in a hospital admission)  OB/GYN Status:  No LMP for male patient.  General Assessment Data [facility] of [facility]: [facility] TTS Assessment: In system Is this a Tele or Face-to-Face Assessment?: Face-to-Face Is this an Initial Assessment or a Re-assessment for this encounter?: Initial Assessment Marital status: Single Maiden name: NA Is patient pregnant?: Other (Comment)(male) Living Arrangements: Parent Can pt return to current living arrangement?: Yes Admission Status: Voluntary Is patient capable of signing voluntary admission?: Yes Referral Source: Self/Family/Friend Insurance type: [place]     Crisis Care Plan Living Arrangements: Parent Legal Guardian: Other:(Self) Name of Psychiatrist[facility] outpatient Name [facility] Therapist[facility]     Risk to self  with the past 6 months Suicidal Ideation: Yes-Currently Present Has patient been a risk to self within the past 6 months prior to admission? : No Suicidal Intent: Yes-Currently Present Has patient had any suicidal intent within the past 6 months prior to admission? : Yes Is patient at risk for suicide?: Yes Suicidal Plan?: No Has patient had any suicidal plan within the past 6 months prior to admission? : No Access to Means: No What has been your use of drugs/alcohol  within the last 12 months?: daily marijuana use Previous Attempts/Gestures: No How many times?: 0 Other Self Harm Risks: none Triggers for Past Attempts: None known Intentional Self Injurious Behavior: None Family Suicide History: No Recent stressful life event(s): Conflict (Comment), Recent negative physical changes(argument with girl friend) Persecutory voices/beliefs?: No Depression: Yes Depression Symptoms: Despondent, Insomnia, Isolating, Guilt, Loss of interest in usual pleasures, Feeling worthless/self pity, Feeling angry/irritable Substance abuse history and/or treatment for substance abuse?: Yes Suicide prevention information given to non-admitted patients: Yes  Risk to Others within the past 6 months Homicidal Ideation: No Does patient have any lifetime risk of violence toward others beyond the six months prior to admission? : No Thoughts of Harm to Others: No Current Homicidal Intent: No Current Homicidal Plan: No Access to Homicidal Means: No Identified Victim: none History of harm to others?: No Assessment of Violence: None Noted Violent Behavior Description: none Does patient have access to weapons?: No Criminal Charges Pending?: No Does patient have a court date: No Is patient on probation?: No  Psychosis Hallucinations: None noted Delusions: None noted  Mental Status Report Appearance/Hygiene: In scrubs, Unremarkable Eye Contact: Fair Motor Activity: Unremarkable, Freedom of movement Speech: Logical/coherent, Soft Level of Consciousness: Alert Mood: Depressed Affect: Depressed Anxiety Level: Minimal Thought Processes: Coherent, Relevant Judgement: Impaired Orientation: Person, Place, Time, Situation, Appropriate for developmental age Obsessive Compulsive Thoughts/Behaviors: None  Cognitive Functioning Concentration: Normal Memory: Recent Intact, Remote Intact Is patient IDD: No Is patient DD?: No Insight: Poor Impulse Control: Fair Appetite:  Poor Have you had any weight changes? : No Change Sleep: Decreased Total Hours of Sleep: 6 Vegetative Symptoms: None  ADLScreening [facility] Assessment Services) Patient's cognitive ability adequate to safely complete daily activities?: Yes Patient able to express need for assistance with ADLs?: Yes Independently performs ADLs?: Yes (appropriate for developmental age)  Prior Inpatient Therapy Prior Inpatient Therapy: No  Prior Outpatient Therapy Prior Outpatient Therapy: Yes Prior Therapy Dates: current Prior Therapy Facilty/Provider(s): [facility] Reason for Treatment: depression Does patient have an ACCT team?: No Does patient have Intensive In-House Services?  : No Does patient have [facility] services? : No Does patient have P4CC services?: No  ADL Screening (condition at time of admission) Patient's cognitive ability adequate to safely complete daily activities?: Yes Patient able to express need for assistance with ADLs?: Yes Independently performs ADLs?: Yes (appropriate for developmental age)       Abuse/Neglect Assessment (Assessment to be complete while patient is alone) Abuse/Neglect Assessment Can Be Completed: Yes Physical Abuse: Denies Verbal Abuse: Denies Sexual Abuse: Denies Exploitation of patient/patient's resources: Denies Self-Neglect: Denies Values / Beliefs Cultural Requests During Hospitalization: None Spiritual Requests During Hospitalization: None Consults Spiritual Care Consult Needed: No Social Work Consult Needed: No            Disposition:  Disposition Initial Assessment Completed for this Encounter: Yes  On Site Evaluation by:   Reviewed with Physician:    Enzo Montgomery 02/26/2018 6:18 AM

## 2018-02-26 NOTE — ED Notes (Signed)
Blood drawn by phlebotomy

## 2018-02-26 NOTE — BH Assessment (Addendum)
[facility] Assessment Progress Note  Per Buford Dresser, DO, this pt requires psychiatric hospitalization at this time.  Letitia Libra, RN, [facility] has assigned pt to [facility] Rm 401-1; [place] will be ready to receive pt at 17:00.  Pt has signed Voluntary Admission and Consent for Treatment, as well as Consent to Release Information to [place] in [place], and a notification call has been placed.  Signed forms have been faxed to [facility].  Pt's nurse, Diane, has been notified, and agrees to send original paperwork along with pt via [facility] [place], and to call report to [phone].  Jalene Mullet, [place] [phone]  Addendum:  After further investigation, this writer found that pt's outpatient provider is the [facility] [place].  Pt has revised Consent to Release Information accordingly, and notification call has been placed to the correct provider.  Jalene Mullet, [place] Coordinator [phone]

## 2018-02-26 NOTE — Progress Notes (Signed)
Patient ID: Kyle Gonzales, male   DOB: October 19, 1990, 27 y.o.   MRN: [id number] Pt resting quietly in bed.  Denied SI, HI, AVH.  Needs assessed.  Pt denied.  Fifteen minute checks in progress for patient safety.  Pt safe on unit.

## 2018-02-27 DIAGNOSIS — R45851 Suicidal ideations: Secondary | ICD-10-CM

## 2018-02-27 DIAGNOSIS — Z560 Unemployment, unspecified: Secondary | ICD-10-CM

## 2018-02-27 DIAGNOSIS — F1210 Cannabis abuse, uncomplicated: Secondary | ICD-10-CM

## 2018-02-27 DIAGNOSIS — K769 Liver disease, unspecified: Secondary | ICD-10-CM

## 2018-02-27 DIAGNOSIS — F419 Anxiety disorder, unspecified: Secondary | ICD-10-CM

## 2018-02-27 DIAGNOSIS — F1099 Alcohol use, unspecified with unspecified alcohol-induced disorder: Secondary | ICD-10-CM

## 2018-02-27 DIAGNOSIS — F332 Major depressive disorder, recurrent severe without psychotic features: Principal | ICD-10-CM

## 2018-02-27 DIAGNOSIS — Z720 Tobacco use: Secondary | ICD-10-CM

## 2018-02-27 DIAGNOSIS — R454 Irritability and anger: Secondary | ICD-10-CM

## 2018-02-27 DIAGNOSIS — Z635 Disruption of family by separation and divorce: Secondary | ICD-10-CM

## 2018-02-27 NOTE — ED Provider Notes (Signed)
[facility] DEPT Provider Note   CSN: [id number] Arrival date & time: 02/25/18  2323     History   Chief Complaint Chief Complaint  Patient presents with  . Suicidal    HPI Kyle Gonzales is a 27 y.o. male.  HPI  27 year old male with history of Graves' disease, hyperlipidemia, PSC, depression comes in with chief complaint of suicidal ideations. Patient does not have an active plan and he denies any previous attempts of suicide.  Patient states that he had a personal relationship issue recently which grew him toward suicidal thoughts.  He feels like his suicidal thoughts are getting stronger and therefore is decided to seek out help.  Patient here with his mother who is at bedside.  Patient states that he has been taking his medications as prescribed and denies any substance abuse.  Past Medical History:  Diagnosis Date  . Graves disease 08/18/2015  . Hyperlipidemia   . Hypothyroid   . Palpitations   . Vitamin D deficiency     Patient Active Problem List   Diagnosis Date Noted  . MDD (major depressive disorder), recurrent severe, without psychosis ([place]) 02/26/2018  . Non-compliance 01/14/2018  . No-show for appointment 01/14/2018  . Current severe episode of major depressive disorder without psychotic features ([place]) 11/06/2017  . Transaminitis 04/08/2017  . Abnormal LFTs 04/08/2017  . Drug addiction / Marajuana ([place]) 01/06/2017  . Hyperlipidemia, mixed 01/11/2016  . Medication management 01/11/2016  . Palpitations 12/04/2015  . Hypothyroidism 12/04/2015  . Graves disease 08/18/2015  . Vitamin D deficiency 08/11/2015    Past Surgical History:  Procedure Laterality Date  . ANTERIOR CRUCIATE LIGAMENT REPAIR  2012  . WISDOM TOOTH EXTRACTION          Home Medications    Prior to Admission medications   Medication Sig Start Date End Date Taking? Authorizing Provider  bisoprolol-hydrochlorothiazide (ZIAC) 10-6.25 MG tablet Take 1  tablet by mouth daily. 01/22/18  Yes Liane Comber, NP  Cholecalciferol (VITAMIN D3) 5000 units CAPS Take 10,000 Units by mouth daily.   Yes [provider]  ezetimibe (ZETIA) 10 MG tablet Take 1 tablet daily for Cholesterol 02/18/18  Yes [name], MD  levothyroxine (SYNTHROID, LEVOTHROID) 75 MCG tablet take 1 tablet by mouth every morning ON AN EMPTY STOMACH 02/08/18  Yes [name], MD  omeprazole (PRILOSEC) 40 MG capsule Take 1 capsule (40 mg total) by mouth daily. 12/19/17  Yes Esterwood, Amy S, PA-C  ondansetron (ZOFRAN) 4 MG tablet Take 1 tablet (4 mg total) by mouth every 6 (six) hours. 02/17/18  Yes [name], MD  ranitidine (ZANTAC) 300 MG tablet Take 1 tablet as needed for heartburn.  Do not exceed 2 tablets in 24 hours. 04/12/16 02/26/18 Yes Forcucci, Courtney, PA-C  sertraline (ZOLOFT) 50 MG tablet 25 mg at night for one week, then 50 mg at night for one week, then 100 mg at night 01/01/18  Yes Hisada, Reina, MD  citalopram (CELEXA) 40 MG tablet Take 1 tablet (40 mg total) by mouth daily. Patient not taking: Reported on 02/26/2018 02/18/18   [name], MD    Family History Family History  Problem Relation Age of Onset  . Diabetes Mother   . Colon cancer Maternal Grandmother   . Stomach cancer Maternal Grandmother   . Diabetes Maternal Grandmother   . Stomach cancer Maternal Grandfather   . Diabetes Maternal Grandfather     Social History Social History   Tobacco Use  . Smoking status:  Current Some Day Smoker  . Smokeless tobacco: Never Used  . Tobacco comment: Marijuana  Substance Use Topics  . Alcohol use: No  . Drug use: Yes    Types: Marijuana     Allergies   Tylenol [acetaminophen]   Review of Systems Review of Systems  Constitutional: Positive for activity change.  Respiratory: Negative for shortness of breath.   Cardiovascular: Negative for chest pain.  Gastrointestinal: Negative for nausea and vomiting.  Psychiatric/Behavioral:  Positive for suicidal ideas.     Physical Exam Updated Vital Signs BP 134/66 (BP Location: Right Arm)   Pulse 62   Temp 97.6 F (36.4 C) (Oral)   Resp 18   Ht 6\' 3"  (1.905 m)   Wt 104.3 kg (230 lb)   SpO2 100%   BMI 28.75 kg/m   Physical Exam  Constitutional: He is oriented to person, place, and time. He appears well-developed.  HENT:  Head: Atraumatic.  Neck: Neck supple.  Cardiovascular: Normal rate.  Pulmonary/Chest: Effort normal.  Neurological: He is alert and oriented to person, place, and time.  Skin: Skin is warm.  Psychiatric:  Flat affect  Nursing note and vitals reviewed.    ED Treatments / Results  Labs (all labs ordered are listed, but only abnormal results are displayed) Labs Reviewed  COMPREHENSIVE METABOLIC PANEL - Abnormal; Notable for the following components:      Result Value   Glucose, Bld 116 (*)    ALT 111 (*)    Alkaline Phosphatase 169 (*)    All other components within normal limits  ACETAMINOPHEN LEVEL - Abnormal; Notable for the following components:   Acetaminophen (Tylenol), Serum <10 (*)    All other components within normal limits  CBC - Abnormal; Notable for the following components:   Platelets 145 (*)    All other components within normal limits  RAPID URINE DRUG SCREEN, HOSP PERFORMED - Abnormal; Notable for the following components:   Tetrahydrocannabinol POSITIVE (*)    All other components within normal limits  ETHANOL  SALICYLATE LEVEL    EKG None  Radiology No results found.  Procedures Procedures (including critical care time)  Medications Ordered in ED Medications - No data to display   Initial Impression / Assessment and Plan / ED Course  I have reviewed the triage vital signs and the nursing notes.  Pertinent labs & imaging results that were available during my care of the patient were reviewed by me and considered in my medical decision making (see chart for details).     27 year old comes in with  chief complaint of depression and suicidal thoughts. It seems like patient is having an episode of major depression with suicidal thoughts.  We will consult psych for optimization.  Patient has multiple medical conditions and he has primary sclerosing cholangitis for which he is not on any medication.  LFTs are slightly elevated because of his primary hepatobiliary disease.  Final Clinical Impressions(s) / ED Diagnoses   Final diagnoses:  MDD (major depressive disorder), recurrent severe, without psychosis [facility])    ED Discharge Orders    None       Varney Biles, MD 02/27/18 0246

## 2018-02-27 NOTE — Progress Notes (Signed)
Recreation Therapy Notes  Date: 5.15.19 Time: 0930 Location: [address] Dayroom  Group Topic: Stress Management  Goal Area(s) Addresses:  Patient will verbalize importance of using healthy stress management.  Patient will identify positive emotions associated with healthy stress management.   Intervention: Stress Management  Activity :  Meditation.  LRT played a meditation on being resilient in the face of adversity.  Patients were to follow along as the meditation played.   Education:  Stress Management, Discharge Planning.   Education Outcome: Acknowledges edcuation/In group clarification offered/Needs additional education  Clinical Observations/Feedback: Pt did not attend group.    Victorino Sparrow, LRT/CTRS         Ria Comment, Lorelei Heikkila A 02/27/2018 11:06 AM

## 2018-02-27 NOTE — BHH Group Notes (Signed)
LCSW Group Therapy Note 02/27/2018 2:29 PM  Type of Therapy/Topic: Group Therapy: Feelings about Diagnosis  Participation Level: Did Not Attend   Description of Group:  This group will allow patients to explore their thoughts and feelings about diagnoses they have received. Patients will be guided to explore their level of understanding and acceptance of these diagnoses. Facilitator will encourage patients to process their thoughts and feelings about the reactions of others to their diagnosis and will guide patients in identifying ways to discuss their diagnosis with significant others in their lives. This group will be process-oriented, with patients participating in exploration of their own experiences, giving and receiving support, and processing challenge from other group members.  Therapeutic Goals: 1. Patient will demonstrate understanding of diagnosis as evidenced by identifying two or more symptoms of the disorder 2. Patient will be able to express two feelings regarding the diagnosis 3. Patient will demonstrate their ability to communicate their needs through discussion and/or role play  Summary of Patient Progress:  Invited, chose not to attend.     Therapeutic Modalities:  Cognitive Behavioral Therapy Brief Therapy Feelings Identification    [place] Clinical Social Worker

## 2018-02-27 NOTE — BHH Counselor (Signed)
Adult Comprehensive Assessment  Patient ID: Kyle Gonzales, male   DOB: 1991/01/16, 27 y.o.   MRN: [id number]  Information Source: Information source: Patient  Current Stressors:  Educational / Learning stressors: Patient denies any stressors  Employment / Job issues: Unemployed Family Relationships: Patient reports his parents are getting a divorce; Strained relationship currently Museum/gallery curator / Lack of resources (include bankruptcy): No income Housing / Lack of housing: Patient reports living with his mother Physical health (include injuries & life threatening diseases): Liver condition Social relationships: Patient denies any stressors  Substance abuse: Patient reports smokes cannabis on a daily basis.  Bereavement / Loss: Patient reports his parents are getting a divorce. He states his father left the family last October   Living/Environment/Situation:  Living Arrangements: Parent Living conditions (as described by patient or guardian): "Good and safe" How long has patient lived in current situation?: "All my life"; 25 years  What is atmosphere in current home: Comfortable, Supportive  Family History:  Marital status: Single Are you sexually active?: Yes What is your sexual orientation?: Heterosexual  Has your sexual activity been affected by drugs, alcohol, medication, or emotional stress?: No Does patient have children?: No  Childhood History:  By whom was/is the patient raised?: Both parents Additional childhood history information: N/A  Description of patient's relationship with caregiver when they were a child: Patient reports having a good relationship with his mother during his childhood. He reports having a "strained relationship" with his father Patient's description of current relationship with people who raised him/her: Patient reports currently being close to his mother, however he does not talk to his father  How were you disciplined when you got in trouble as a  child/adolescent?: Whoopings and restricitions  Does patient have siblings?: Yes Number of Siblings: 1 Description of patient's current relationship with siblings: Patient reports that he and his younger brother are "cool". Did patient suffer any verbal/emotional/physical/sexual abuse as a child?: No Did patient suffer from severe childhood neglect?: No Has patient ever been sexually abused/assaulted/raped as an adolescent or adult?: No Was the patient ever a victim of a crime or a disaster?: No Witnessed domestic violence?: No Has patient been effected by domestic violence as an adult?: No  Education:  Highest grade of school patient has completed: 12th grade; Some college Currently a student?: No Learning disability?: No  Employment/Work Situation:   Employment situation: Unemployed Patient's job has been impacted by current illness: No What is the longest time patient has a held a job?: 1 year Where was the patient employed at that time?: [employer] Has patient ever been in the [employer]?: No Has patient ever served in combat?: No Did You Receive Any Psychiatric Treatment/Services While in Passenger transport manager?: No Are There Guns or Other Weapons in [place]?: No  Financial Resources:   Museum/gallery curator resources: Support from parents / caregiver, No income, Multimedia programmer Does patient have a Programmer, applications or guardian?: No  Alcohol/Substance Abuse:   What has been your use of drugs/alcohol within the last 12 months?: Patient reports smoking cannabis on a daily basis.  If attempted suicide, did drugs/alcohol play a role in this?: No Alcohol/Substance Abuse Treatment Hx: Denies past history Has alcohol/substance abuse ever caused legal problems?: No  Social Support System:   Patient's Community Support System: Fair Dietitian Support System: "My mother" Type of faith/religion: "I do believe in God" How does patient's faith help to cope with current  illness?: N/A  Leisure/Recreation:   Leisure and Hobbies: "hanging  out with my friends, go out occassionally, smoke weed and hang with family"  Strengths/Needs:   What things does the patient do well?: "Easy going" In what areas does patient struggle / problems for patient: "Work on my anger and learn how to control my emotions"  Discharge Plan:   Does patient have access to transportation?: Yes Will patient be returning to same living situation after discharge?: Yes Currently receiving community mental health services: Yes (From Whom)([facility] ) Does patient have financial barriers related to discharge medications?: Yes Patient description of barriers related to discharge medications: No income   Summary/Recommendations:   Summary and Recommendations (to be completed by the evaluator): Navdeep is a 27 year old male who is diagnosed with Major Depressive disorder, recurrent episode, severe. He presented to the hospital seeking treatment for suicidal ideation. During the assessment, Rayen was pleasant and cooperative. He presented with a flat affect. Marquez reports that he came to the hospital because he was feeling overhwlemed. He reports that he follows up with [facility] for medication management and therapy services. Ernesto reports he is returning home with his mother at discharge. [place] can benefit from crisis stabilization, medication management, therapeutic milieua and referral services.   Marylee Floras. 02/27/2018

## 2018-02-27 NOTE — Progress Notes (Signed)
Pt presents with a flat affect and anxious mood. Pt rates depression 2/10. Anxiety 3/10. Pt denies SI/HI. Pt expressed that he's been having suicidal thought and unable to control his mood so he went to the hosp seeking help. Pt expressed that he wants to be able to control his anxiety, depression and anger. Pt stated goal for today is to "stay positive". Pt hypertensive this am. Writer notified MD. Will administered B/p med when it arrives from [facility] and recheck B/p.  Medications reviewed with pt. Verbal support provided. Pt encouraged to attend groups. 15 minute checks performed for safety. Will continue to monitor B/P.   Pt compliant with tx plan. No side effects to meds verbalized by pt. No concerns verbalized by pt.

## 2018-02-27 NOTE — BHH Suicide Risk Assessment (Signed)
[facility] INPATIENT:  Family/Significant Other Suicide Prevention Education  Suicide Prevention Education:  Education Completed; Duante Arocho, mother [phone]) has been identified by the patient as the family member/significant other with whom the patient will be residing, and identified as the person(s) who will aid the patient in the event of a mental health crisis (suicidal ideations/suicide attempt).  With written consent from the patient, the family member/significant other has been provided the following suicide prevention education, prior to the and/or following the discharge of the patient.  The suicide prevention education provided includes the following:  Suicide risk factors  Suicide prevention and interventions  National Suicide Hotline telephone number  [facility] assessment telephone number  [facility] Emergency Assistance [place] and/or Residential Mobile Crisis Unit telephone number  Request made of family/significant other to:  Remove weapons (e.g., guns, rifles, knives), all items previously/currently identified as safety concern.    Remove drugs/medications (over-the-counter, prescriptions, illicit drugs), all items previously/currently identified as a safety concern.  The family member/significant other verbalizes understanding of the suicide prevention education information provided.  The family member/significant other agrees to remove the items of safety concern listed above.  Marylee Floras 02/27/2018, 4:01 PM

## 2018-02-27 NOTE — Progress Notes (Signed)
Adult Psychoeducational Group Note  Date:  02/27/2018 Time:  9:28 AM  Group Topic/Focus:  Goals Group:   The focus of this group is to help patients establish daily goals to achieve during treatment and discuss how the patient can incorporate goal setting into their daily lives to aide in recovery.  Participation Level:  Active  Participation Quality:  Appropriate  Affect:  Appropriate  Cognitive:  Appropriate  Insight: Appropriate  Engagement in Group:  Engaged  Modes of Intervention:  Discussion  Additional Comments:  Pt was in group and was listening about how to formulate a SMART goal.   Tyrell Antonio Kendelle Schweers 02/27/2018, 9:28 AM

## 2018-02-27 NOTE — BHH Suicide Risk Assessment (Signed)
[facility] Admission Suicide Risk Assessment   Nursing information obtained from:   patient and chart  Demographic factors:   27 year old single male, unemployed, lives with mother  Current Mental Status:   see below Loss Factors:   chronic liver disease, parents divorcing, unemployment  Historical Factors:   depression Risk Reduction Factors:   resilience   Total Time spent with patient: 45 minutes Principal Problem: MDD Diagnosis:   Patient Active Problem List   Diagnosis Date Noted  . MDD (major depressive disorder), recurrent severe, without psychosis ([place]) [F33.2] 02/26/2018  . Non-compliance [Z91.19] 01/14/2018  . No-show for appointment [Z53.29] 01/14/2018  . Current severe episode of major depressive disorder without psychotic features ([place]) [F32.2] 11/06/2017  . Transaminitis [R74.0] 04/08/2017  . Abnormal LFTs [R94.5] 04/08/2017  . Drug addiction / Marajuana ([place]) [F19.20] 01/06/2017  . Hyperlipidemia, mixed [E78.2] 01/11/2016  . Medication management [Z79.899] 01/11/2016  . Palpitations [R00.2] 12/04/2015  . Hypothyroidism [E03.9] 12/04/2015  . Graves disease [E05.00] 08/18/2015  . Vitamin D deficiency [E55.9] 08/11/2015    Continued Clinical Symptoms:  Alcohol Use Disorder Identification Test Final Score (AUDIT): 0 The "Alcohol Use Disorders Identification Test", Guidelines for Use in Primary Care, Second Edition.  World Pharmacologist [facility]). Score between 0-7:  no or low risk or alcohol related problems. Score between 8-15:  moderate risk of alcohol related problems. Score between 16-19:  high risk of alcohol related problems. Score 20 or above:  warrants further diagnostic evaluation for alcohol dependence and treatment.   CLINICAL FACTORS:  27 year old single male, lives with mother, unemployed. Presents to hospital voluntarily due to chronic, worsening depression and passive SI. Attributes depression in part to chronic liver disease ( reports diagnosis of primary  cholangitis- currently asymptomatic), and parents going through divorce   Psychiatric Specialty Exam: Physical Exam  ROS  Blood pressure 140/78, pulse (!) 53, temperature 97.9 F (36.6 C), temperature source Oral, resp. rate 12, height 6\' 3"  (1.905 m), weight 101.6 kg (224 lb).Body mass index is 28 kg/m.  See admit note MSE   COGNITIVE FEATURES THAT CONTRIBUTE TO RISK:  Closed-mindedness and Loss of executive function    SUICIDE RISK:   Moderate:  Frequent suicidal ideation with limited intensity, and duration, some specificity in terms of plans, no associated intent, good self-control, limited dysphoria/symptomatology, some risk factors present, and identifiable protective factors, including available and accessible social support.  PLAN OF CARE: Patient will be admitted to inpatient psychiatric unit for stabilization and safety. Will provide and encourage milieu participation. Provide medication management and maked adjustments as needed.  Will follow daily.    I certify that inpatient services furnished can reasonably be expected to improve the patient's condition.   Jenne Campus, MD 02/27/2018, 10:55 AM

## 2018-02-27 NOTE — Progress Notes (Addendum)
Patient ID: Kyle Gonzales, male   DOB: Sep 08, 1991, 27 y.o.   MRN: [id number]  Pt currently presents with an anxious affect and cautious behavior. Pt reports to Probation officer that he had a "better day" today. Pt reports ongoing anxiety. Chooses to attend group, seen interacting positively with peers. Pt reports difficulty falling asleep with trazodone alone.    Pt provided with medications per providers orders. Pt's labs and vitals were monitored throughout the night. Pt given a 1:1 about emotional and mental status. Pt supported and encouraged to express concerns and questions. Pt educated on medications.  Pt's safety ensured with 15 minute and environmental checks. Pt currently denies SI/HI and A/V hallucinations. Pt verbally agrees to seek staff if SI/HI or A/VH occurs and to consult with staff before acting on any harmful thoughts. Will continue POC.

## 2018-02-27 NOTE — H&P (Signed)
Psychiatric Admission Assessment Adult  Patient Identification: Kyle Gonzales MRN:  [id number] Date of Evaluation:  02/27/2018 Chief Complaint:  " I need help" Principal Diagnosis: MDD, no psychotic features  Diagnosis:   Patient Active Problem List   Diagnosis Date Noted  . MDD (major depressive disorder), recurrent severe, without psychosis ([place]) [F33.2] 02/26/2018  . Non-compliance [Z91.19] 01/14/2018  . No-show for appointment [Z53.29] 01/14/2018  . Current severe episode of major depressive disorder without psychotic features ([place]) [F32.2] 11/06/2017  . Transaminitis [R74.0] 04/08/2017  . Abnormal LFTs [R94.5] 04/08/2017  . Drug addiction / Marajuana ([place]) [F19.20] 01/06/2017  . Hyperlipidemia, mixed [E78.2] 01/11/2016  . Medication management [Z79.899] 01/11/2016  . Palpitations [R00.2] 12/04/2015  . Hypothyroidism [E03.9] 12/04/2015  . Graves disease [E05.00] 08/18/2015  . Vitamin D deficiency [E55.9] 08/11/2015   History of Present Illness: 27 year old single male, lives with mother . Presented to hospital voluntarily, due to worsening depression, passive suicidal ideations. States he has been facing significant stressors which have contributed to depression, including chronic liver disease and his  parents being in the process of divorcing ( states father left home a few months ago), but states he has been struggling with intermittent depression for several years. He reports he has had intermittent suicidal ideations, which he describes as passive " like thoughts of dying", over the last few months . Endorses some neuro-vegetative symptoms as below. Denies psychotic symptoms. In addition to depression he also reports history suggestive of Intermittent Explosive Disorder, describes episodic angry outbursts,explosiveness of short duration Associated Signs/Symptoms: Depression Symptoms:  depressed mood, insomnia, suicidal thoughts without plan, anxiety, decreased appetite, some  unspecified weight loss  (Hypo) Manic Symptoms:  None noted or endorsed Anxiety Symptoms:  Reports increased anxiety and worry, denies panic attacks Psychotic Symptoms:  Denies  PTSD Symptoms: denies   Total Time spent with patient: 45 minutes  Past Psychiatric History:  No prior psychiatric admissions, has never attempted suicide , denies history of self cutting, does not endorse any clear history of mania or hypomania, describes symptoms suggestive of Intermittent Explosive Disorder, which he describes as brief episodes of anger, explosiveness of short duration ( minutes ) after which he often feels guilty and apologetic . Denies history of psychosis. Reports increasing anxiety over recent weeks to months, denies panic attacks, no agoraphobia  Is the patient at risk to self? Yes.    Has the patient been a risk to self in the past 6 months? Yes.    Has the patient been a risk to self within the distant past? No.  Is the patient a risk to others? No.  Has the patient been a risk to others in the past 6 months? No.  Has the patient been a risk to others within the distant past? No.   Prior Inpatient Therapy:  denies  Prior Outpatient Therapy:  follows up at [facility]  Alcohol Screening: 1. How often do you have a drink containing alcohol?: Never 2. How many drinks containing alcohol do you have on a typical day when you are drinking?: 1 or 2 3. How often do you have six or more drinks on one occasion?: Never AUDIT-C Score: 0 4. How often during the last year have you found that you were not able to stop drinking once you had started?: Never 5. How often during the last year have you failed to do what was normally expected from you becasue of drinking?: Never 6. How often during the last year have  you needed a first drink in the morning to get yourself going after a heavy drinking session?: Never 7. How often during the last year have you had a feeling of guilt of remorse  after drinking?: Never 8. How often during the last year have you been unable to remember what happened the night before because you had been drinking?: Never 9. Have you or someone else been injured as a result of your drinking?: No 10. Has a relative or friend or a doctor or another health worker been concerned about your drinking or suggested you cut down?: No Alcohol Use Disorder Identification Test Final Score (AUDIT): 0 Intervention/Follow-up: AUDIT Score <7 follow-up not indicated Substance Abuse History in the last 12 months: denies alcohol abuse , endorses cannabis abuse, uses regularly, no other drug abuse . Consequences of Substance Abuse: Does not endorse  Previous Psychotropic Medications: had been on Celexa but felt it did not help, about three weeks started Zoloft. Denies side effects.  Psychological Evaluations: No  Past Medical History: reports history of primary sclerosing cholangitis- states it was diagnosed last year.  States he is asymptomatic, but has been told he may need a liver transplant within a decade.  Past Medical History:  Diagnosis Date  . Graves disease 08/18/2015  . Hyperlipidemia   . Hypothyroid   . Palpitations   . Vitamin D deficiency     Past Surgical History:  Procedure Laterality Date  . ANTERIOR CRUCIATE LIGAMENT REPAIR  2012  . WISDOM TOOTH EXTRACTION     Family History: parents alive, currently going through divorce, has one sibling  Family History  Problem Relation Age of Onset  . Diabetes Mother   . Colon cancer Maternal Grandmother   . Stomach cancer Maternal Grandmother   . Diabetes Maternal Grandmother   . Stomach cancer Maternal Grandfather   . Diabetes Maternal Grandfather    Family Psychiatric  History: denies psychiatric illness in family, no suicides in family  Tobacco Screening: does not smoke, occasionally vapes  Social History: single, no children, currently unemployed, lives with mother, highest educational level - partial  college   Social History   Substance and Sexual Activity  Alcohol Use No     Social History   Substance and Sexual Activity  Drug Use Yes  . Types: Marijuana    Additional Social History:  Allergies:   Allergies  Allergen Reactions  . Tylenol [Acetaminophen]     r/t elevated LFTs   Lab Results:  Results for orders placed or performed during the hospital encounter of 02/25/18 (from the past 48 hour(s))  Rapid urine drug screen (hospital performed)     Status: Abnormal   Collection Time: 02/26/18 10:55 AM  Result Value Ref Range   Opiates NONE DETECTED NONE DETECTED   Cocaine NONE DETECTED NONE DETECTED   Benzodiazepines NONE DETECTED NONE DETECTED   Amphetamines NONE DETECTED NONE DETECTED   Tetrahydrocannabinol POSITIVE (A) NONE DETECTED   Barbiturates NONE DETECTED NONE DETECTED    Comment: (NOTE) DRUG SCREEN FOR MEDICAL PURPOSES ONLY.  IF CONFIRMATION IS NEEDED FOR ANY PURPOSE, NOTIFY LAB WITHIN 5 DAYS. LOWEST DETECTABLE LIMITS FOR URINE DRUG SCREEN Drug Class                     Cutoff (ng/mL) Amphetamine and metabolites    1000 Barbiturate and metabolites    200 Benzodiazepine                 025 Tricyclics and metabolites  300 Opiates and metabolites        300 Cocaine and metabolites        300 THC                            50 Performed at [facility] [address], [place], [place]  [address]   Comprehensive metabolic panel     Status: Abnormal   Collection Time: 02/26/18 11:25 AM  Result Value Ref Range   Sodium 139 135 - 145 mmol/L   Potassium 3.6 3.5 - 5.1 mmol/L   Chloride 103 101 - 111 mmol/L   CO2 26 22 - 32 mmol/L   Glucose, Bld 116 (H) 65 - 99 mg/dL   BUN 10 6 - 20 mg/dL   Creatinine, Ser 1.02 0.61 - 1.24 mg/dL   Calcium 10.0 8.9 - 10.3 mg/dL   Total Protein 8.1 6.5 - 8.1 g/dL   Albumin 4.5 3.5 - 5.0 g/dL   AST 32 15 - 41 U/L   ALT 111 (H) 17 - 63 U/L   Alkaline Phosphatase 169 (H) 38 - 126 U/L   Total Bilirubin  0.9 0.3 - 1.2 mg/dL   GFR calc non Af Amer >60 >60 mL/min   GFR calc Af Amer >60 >60 mL/min    Comment: (NOTE) The eGFR has been calculated using the CKD EPI equation. This calculation has not been validated in all clinical situations. eGFR's persistently <60 mL/min signify possible Chronic Kidney Disease.    Anion gap 10 5 - 15    Comment: Performed at [facility], [place] [address] [place], [place] [address]  Ethanol     Status: None   Collection Time: 02/26/18 11:25 AM  Result Value Ref Range   Alcohol, Ethyl (B) <10 <10 mg/dL    Comment: (NOTE) Lowest detectable limit for serum alcohol is 10 mg/dL. For medical purposes only. Performed at [facility], [place] [address], [place], [place] [address]   Salicylate level     Status: None   Collection Time: 02/26/18 11:25 AM  Result Value Ref Range   Salicylate Lvl <8.8 2.8 - 30.0 mg/dL    Comment: Performed at [facility], [place] [address], [place], [place] [address]  Acetaminophen level     Status: Abnormal   Collection Time: 02/26/18 11:25 AM  Result Value Ref Range   Acetaminophen (Tylenol), Serum <10 (L) 10 - 30 ug/mL    Comment: (NOTE) Therapeutic concentrations vary significantly. A range of 10-30 ug/mL  may be an effective concentration for many patients. However, some  are best treated at concentrations outside of this range. Acetaminophen concentrations >150 ug/mL at 4 hours after ingestion  and >50 ug/mL at 12 hours after ingestion are often associated with  toxic reactions. Performed at [facility], [place] [address] [place],  [address]   cbc     Status: Abnormal   Collection Time: 02/26/18 11:25 AM  Result Value Ref Range   WBC 5.8 4.0 - 10.5 K/uL   RBC 4.55 4.22 - 5.81 MIL/uL   Hemoglobin 14.1 13.0 - 17.0 g/dL   HCT 41.8 39.0 - 52.0 %   MCV 91.9 78.0 - 100.0 fL   MCH 31.0 26.0 - 34.0 pg   MCHC 33.7 30.0 - 36.0 g/dL   RDW 14.3 11.5  - 15.5 %   Platelets 145 (L) 150 - 400 K/uL    Comment: Performed  at [facility], [place] [address], [place], [place] [address]    Blood Alcohol level:  Lab Results  Component Value Date   ETH <10 74/16/3845    Metabolic Disorder Labs:  Lab Results  Component Value Date   HGBA1C 5.5 02/04/2018   MPG 111 02/04/2018   MPG 111 10/02/2017   No results found for: PROLACTIN Lab Results  Component Value Date   CHOL 183 02/04/2018   TRIG 124 02/04/2018   HDL 59 02/04/2018   CHOLHDL 3.1 02/04/2018   VLDL 21 04/09/2017   LDLCALC 101 (H) 02/04/2018   LDLCALC 162 (H) 10/02/2017    Current Medications: Current Facility-Administered Medications  Medication Dose Route Frequency Provider Last Rate Last Dose  . alum & mag hydroxide-simeth (MAALOX/MYLANTA) 200-200-20 MG/5ML suspension 30 mL  30 mL Oral Q4H PRN Ethelene Hal, NP      . bisoprolol-hydrochlorothiazide [facility]) 10-6.25 MG per tablet 1 tablet  1 tablet Oral Daily Ethelene Hal, NP      . cholecalciferol (VITAMIN D) tablet 10,000 Units  10,000 Units Oral Daily Ethelene Hal, NP   10,000 Units at 02/27/18 0809  . ezetimibe (ZETIA) tablet 10 mg  10 mg Oral Daily Ethelene Hal, NP   10 mg at 02/27/18 0751  . famotidine (PEPCID) tablet 20 mg  20 mg Oral Daily Ethelene Hal, NP   20 mg at 02/27/18 0751  . hydrOXYzine (ATARAX/VISTARIL) tablet 25 mg  25 mg Oral TID PRN Ethelene Hal, NP      . levothyroxine (SYNTHROID, LEVOTHROID) tablet 75 mcg  75 mcg Oral Daily Ethelene Hal, NP   75 mcg at 02/27/18 3646  . LORazepam (ATIVAN) tablet 1 mg  1 mg Oral PRN Ethelene Hal, NP       And  . ziprasidone (GEODON) injection 20 mg  20 mg Intramuscular PRN Ethelene Hal, NP      . magnesium hydroxide (MILK OF MAGNESIA) suspension 30 mL  30 mL Oral Daily PRN Ethelene Hal, NP      . sertraline (ZOLOFT) tablet 100 mg  100 mg Oral QHS Ethelene Hal,  NP   100 mg at 02/26/18 2245  . traZODone (DESYREL) tablet 50 mg  50 mg Oral QHS PRN Ethelene Hal, NP   50 mg at 02/26/18 2245   PTA Medications: Medications Prior to Admission  Medication Sig Dispense Refill Last Dose  . bisoprolol-hydrochlorothiazide (ZIAC) 10-6.25 MG tablet Take 1 tablet by mouth daily. 90 tablet 0 02/25/2018 at 1800  . Cholecalciferol (VITAMIN D3) 5000 units CAPS Take 10,000 Units by mouth daily.   02/25/2018 at Unknown time  . citalopram (CELEXA) 40 MG tablet Take 1 tablet (40 mg total) by mouth daily. (Patient not taking: Reported on 02/26/2018) 90 tablet 0 Not Taking at Unknown time  . ezetimibe (ZETIA) 10 MG tablet Take 1 tablet daily for Cholesterol 90 tablet 0 02/25/2018 at Unknown time  . levothyroxine (SYNTHROID, LEVOTHROID) 75 MCG tablet take 1 tablet by mouth every morning ON AN EMPTY STOMACH 90 tablet 1 02/25/2018 at Unknown time  . omeprazole (PRILOSEC) 40 MG capsule Take 1 capsule (40 mg total) by mouth daily. 30 capsule 1 02/25/2018 at Unknown time  . ondansetron (ZOFRAN) 4 MG tablet Take 1 tablet (4 mg total) by mouth every 6 (six) hours. 20 tablet 0 Past Month at Unknown time  . ranitidine (ZANTAC) 300 MG tablet Take 1 tablet as needed for heartburn.  Do not exceed 2 tablets in 24 hours. 90 tablet 1 not used  . sertraline (ZOLOFT) 50 MG tablet 25 mg at night for one week, then 50 mg at night for one week, then 100 mg at night 30 tablet 1 02/25/2018 at Unknown time    Musculoskeletal: Strength & Muscle Tone: within normal limits Gait & Station: normal Patient leans: N/A  Psychiatric Specialty Exam: Physical Exam  Review of Systems  Constitutional: Negative.   HENT: Negative.   Eyes: Negative.   Respiratory: Negative.   Cardiovascular: Negative.   Gastrointestinal: Negative for abdominal pain, blood in stool, constipation, nausea and vomiting.       Denies acholia or choluria, denies RUQ pain or abdominal discomfort   Musculoskeletal: Negative.    Skin: Negative.  Negative for itching and rash.  Neurological: Negative for seizures and headaches.  Psychiatric/Behavioral: Positive for depression and suicidal ideas.  All other systems reviewed and are negative.   Blood pressure 140/78, pulse (!) 53, temperature 97.9 F (36.6 C), temperature source Oral, resp. rate 12, height 6' 3"  (1.905 m), weight 101.6 kg (224 lb).Body mass index is 28 kg/m.  General Appearance: Fairly Groomed  Eye Contact:  Fair  Speech:  Normal Rate  Volume:  Decreased  Mood:  Depressed  Affect:  constricted  Thought Process:  Linear and Descriptions of Associations: Intact  Orientation:  Other:  fully alert and attentive  Thought Content:  denies hallucinations, no delusions , not internally preoccupied   Suicidal Thoughts:  No denies any suicidal or self injurious ideations at this time and contracts for safety   Homicidal Thoughts:  No denies any homicidal or violent ideations  Memory:  recent and remote grossly intact   Judgement:  Fair  Insight:  Fair  Psychomotor Activity:  Decreased  Concentration:  Concentration: Good and Attention Span: Good  Recall:  Good  Fund of Knowledge:  Good  Language:  Good  Akathisia:  Negative  Handed:  Right  AIMS (if indicated):     Assets:  Communication Skills Desire for Improvement Resilience  ADL's:  Intact  Cognition:  WNL  Sleep:  Number of Hours: 4.5    Treatment Plan Summary: Daily contact with patient to assess and evaluate symptoms and progress in treatment, Medication management, Plan inpatient admission and medications as below  Observation Level/Precautions:  15 minute checks  Laboratory:  as needed  Psychotherapy:  [place], group therapy  Medications: Zoloft was increased from 50 mgrs QDAY to 100 mgrs QDAY, states he has been on this medication for 3 weeks or so, denies side effects thus far     Consultations:-  As needed   Discharge Concerns:  -  Estimated LOS: 4 days   Other:      Physician Treatment Plan for Primary Diagnosis: MDD, no psychotic features  Long Term Goal(s): Improvement in symptoms so as ready for discharge  Short Term Goals: Ability to identify changes in lifestyle to reduce recurrence of condition will improve and Ability to maintain clinical measurements within normal limits will improve  Physician Treatment Plan for Secondary Diagnosis: suicidal ideations Long Term Goal(s): Improvement in symptoms so as ready for discharge  Short Term Goals: Ability to identify changes in lifestyle to reduce recurrence of condition will improve, Ability to verbalize feelings will improve, Ability to disclose and discuss suicidal ideas, Ability to demonstrate self-control will improve, Ability to identify and develop effective coping behaviors will improve and Ability to maintain clinical measurements within normal limits will improve  I certify that inpatient services furnished can reasonably be expected to improve the patient's condition.    Jenne Campus, MD 5/15/201910:22 AM

## 2018-02-27 NOTE — Tx Team (Signed)
Interdisciplinary Treatment and Diagnostic Plan Update  02/27/2018 Time of Session: 9:30am Kyle Gonzales MRN: [id number]  Principal Diagnosis: <principal problem not specified>  Secondary Diagnoses: Active Problems:   MDD (major depressive disorder), recurrent severe, without psychosis ([name])   Current Medications:  Current Facility-Administered Medications  Medication Dose Route Frequency Provider Last Rate Last Dose  . alum & mag hydroxide-simeth (MAALOX/MYLANTA) 200-200-20 MG/5ML suspension 30 mL  30 mL Oral Q4H PRN Ethelene Hal, NP      . bisoprolol-hydrochlorothiazide [facility]) 10-6.25 MG per tablet 1 tablet  1 tablet Oral Daily Ethelene Hal, NP   Stopped at 02/27/18 1212  . cholecalciferol (VITAMIN D) tablet 10,000 Units  10,000 Units Oral Daily Ethelene Hal, NP   10,000 Units at 02/27/18 0809  . ezetimibe (ZETIA) tablet 10 mg  10 mg Oral Daily Ethelene Hal, NP   10 mg at 02/27/18 0751  . famotidine (PEPCID) tablet 20 mg  20 mg Oral Daily Ethelene Hal, NP   20 mg at 02/27/18 0751  . hydrOXYzine (ATARAX/VISTARIL) tablet 25 mg  25 mg Oral TID PRN Ethelene Hal, NP      . levothyroxine (SYNTHROID, LEVOTHROID) tablet 75 mcg  75 mcg Oral Daily Ethelene Hal, NP   75 mcg at 02/27/18 4665  . LORazepam (ATIVAN) tablet 1 mg  1 mg Oral PRN Ethelene Hal, NP       And  . ziprasidone (GEODON) injection 20 mg  20 mg Intramuscular PRN Ethelene Hal, NP      . magnesium hydroxide (MILK OF MAGNESIA) suspension 30 mL  30 mL Oral Daily PRN Ethelene Hal, NP      . sertraline (ZOLOFT) tablet 100 mg  100 mg Oral QHS Ethelene Hal, NP   100 mg at 02/26/18 2245  . traZODone (DESYREL) tablet 50 mg  50 mg Oral QHS PRN Ethelene Hal, NP   50 mg at 02/26/18 2245   PTA Medications: Medications Prior to Admission  Medication Sig Dispense Refill Last Dose  . bisoprolol-hydrochlorothiazide (ZIAC) 10-6.25 MG tablet  Take 1 tablet by mouth daily. 90 tablet 0 02/25/2018 at 1800  . Cholecalciferol (VITAMIN D3) 5000 units CAPS Take 10,000 Units by mouth daily.   02/25/2018 at Unknown time  . citalopram (CELEXA) 40 MG tablet Take 1 tablet (40 mg total) by mouth daily. (Patient not taking: Reported on 02/26/2018) 90 tablet 0 Not Taking at Unknown time  . ezetimibe (ZETIA) 10 MG tablet Take 1 tablet daily for Cholesterol 90 tablet 0 02/25/2018 at Unknown time  . levothyroxine (SYNTHROID, LEVOTHROID) 75 MCG tablet take 1 tablet by mouth every morning ON AN EMPTY STOMACH 90 tablet 1 02/25/2018 at Unknown time  . omeprazole (PRILOSEC) 40 MG capsule Take 1 capsule (40 mg total) by mouth daily. 30 capsule 1 02/25/2018 at Unknown time  . ondansetron (ZOFRAN) 4 MG tablet Take 1 tablet (4 mg total) by mouth every 6 (six) hours. 20 tablet 0 Past Month at Unknown time  . ranitidine (ZANTAC) 300 MG tablet Take 1 tablet as needed for heartburn.  Do not exceed 2 tablets in 24 hours. 90 tablet 1 not used  . sertraline (ZOLOFT) 50 MG tablet 25 mg at night for one week, then 50 mg at night for one week, then 100 mg at night 30 tablet 1 02/25/2018 at Unknown time    Patient Stressors: Educational concerns Financial difficulties Health problems Marital or family conflict Occupational concerns  Patient Strengths:  Ability for insight Average or above average intelligence Capable of independent living Communication skills General fund of knowledge Supportive family/friends  Treatment Modalities: Medication Management, Group therapy, Case management,  1 to 1 session with clinician, Psychoeducation, Recreational therapy.   Physician Treatment Plan for Primary Diagnosis: <principal problem not specified> Long Term Goal(s): Improvement in symptoms so as ready for discharge Improvement in symptoms so as ready for discharge   Short Term Goals: Ability to identify changes in lifestyle to reduce recurrence of condition will  improve Ability to maintain clinical measurements within normal limits will improve Ability to identify changes in lifestyle to reduce recurrence of condition will improve Ability to verbalize feelings will improve Ability to disclose and discuss suicidal ideas Ability to demonstrate self-control will improve Ability to identify and develop effective coping behaviors will improve Ability to maintain clinical measurements within normal limits will improve  Medication Management: Evaluate patient's response, side effects, and tolerance of medication regimen.  Therapeutic Interventions: 1 to 1 sessions, Unit Group sessions and Medication administration.  Evaluation of Outcomes: Not Met  Physician Treatment Plan for Secondary Diagnosis: Active Problems:   MDD (major depressive disorder), recurrent severe, without psychosis ([name])  Long Term Goal(s): Improvement in symptoms so as ready for discharge Improvement in symptoms so as ready for discharge   Short Term Goals: Ability to identify changes in lifestyle to reduce recurrence of condition will improve Ability to maintain clinical measurements within normal limits will improve Ability to identify changes in lifestyle to reduce recurrence of condition will improve Ability to verbalize feelings will improve Ability to disclose and discuss suicidal ideas Ability to demonstrate self-control will improve Ability to identify and develop effective coping behaviors will improve Ability to maintain clinical measurements within normal limits will improve     Medication Management: Evaluate patient's response, side effects, and tolerance of medication regimen.  Therapeutic Interventions: 1 to 1 sessions, Unit Group sessions and Medication administration.  Evaluation of Outcomes: Not Met   RN Treatment Plan for Primary Diagnosis: <principal problem not specified> Long Term Goal(s): Knowledge of disease and therapeutic regimen to maintain health  will improve  Short Term Goals: Ability to remain free from injury will improve, Ability to verbalize frustration and anger appropriately will improve, Ability to demonstrate self-control, Ability to participate in decision making will improve, Ability to verbalize feelings will improve, Ability to disclose and discuss suicidal ideas, Ability to identify and develop effective coping behaviors will improve and Compliance with prescribed medications will improve  Medication Management: RN will administer medications as ordered by provider, will assess and evaluate patient's response and provide education to patient for prescribed medication. RN will report any adverse and/or side effects to prescribing provider.  Therapeutic Interventions: 1 on 1 counseling sessions, Psychoeducation, Medication administration, Evaluate responses to treatment, Monitor vital signs and CBGs as ordered, Perform/monitor CIWA, COWS, AIMS and Fall Risk screenings as ordered, Perform wound care treatments as ordered.  Evaluation of Outcomes: Not Met   LCSW Treatment Plan for Primary Diagnosis: <principal problem not specified> Long Term Goal(s): Safe transition to appropriate next level of care at discharge, Engage patient in therapeutic group addressing interpersonal concerns.  Short Term Goals: Engage patient in aftercare planning with referrals and resources, Increase social support, Increase ability to appropriately verbalize feelings, Increase emotional regulation, Facilitate acceptance of mental health diagnosis and concerns, Facilitate patient progression through stages of change regarding substance use diagnoses and concerns, Identify triggers associated with mental health/substance abuse issues and Increase skills for wellness  and recovery  Therapeutic Interventions: Assess for all discharge needs, 1 to 1 time with Education officer, museum, Explore available resources and support systems, Assess for adequacy in community support  network, Educate family and significant other(s) on suicide prevention, Complete Psychosocial Assessment, Interpersonal group therapy.  Evaluation of Outcomes: Not Met   Progress in Treatment: Attending groups: No. Participating in groups: No. Taking medication as prescribed: Yes. Toleration medication: Yes. Family/Significant other contact made: No, will contact:  the patient's mother Patient understands diagnosis: Yes. Discussing patient identified problems/goals with staff: Yes. Medical problems stabilized or resolved: Yes. Denies suicidal/homicidal ideation: Yes. Issues/concerns per patient self-inventory: No. Other:  New problem(s) identified: None   New Short Term/Long Term Goal(s):medication stabilization, elimination of SI thoughts, development of comprehensive mental wellness plan.   Patient Goals: "to not get in these bad moods"  Discharge Plan or Barriers: Patient plans to return home with his mother and continue to follow up with [place] for medication management and therapy services.   Reason for Continuation of Hospitalization: Anxiety Depression Medication stabilization  Estimated Length of Stay: Monday, 03/04/18  Attendees: Patient: Kyle Gonzales  02/27/2018 3:15 PM  Physician: Dr. Neita Garnet, MD 02/27/2018 3:15 PM  Nursing: Sharl Ma. Viona Gilmore, RN 02/27/2018 3:15 PM  RN Care Manager: Rhunette Croft 02/27/2018 3:15 PM  Social Worker: Radonna Ricker, [place] 02/27/2018 3:15 PM  Recreational Therapist: X 02/27/2018 3:15 PM  Other: X 02/27/2018 3:15 PM  Other: X 02/27/2018 3:15 PM  Other:X 02/27/2018 3:15 PM    Scribe for Treatment Team: Marylee Floras, [place] 02/27/2018 3:15 PM

## 2018-02-28 DIAGNOSIS — G4700 Insomnia, unspecified: Secondary | ICD-10-CM

## 2018-02-28 DIAGNOSIS — E039 Hypothyroidism, unspecified: Secondary | ICD-10-CM

## 2018-02-28 DIAGNOSIS — F1290 Cannabis use, unspecified, uncomplicated: Secondary | ICD-10-CM

## 2018-02-28 DIAGNOSIS — K8309 Other cholangitis: Secondary | ICD-10-CM

## 2018-02-28 NOTE — Progress Notes (Signed)
[facility] MD Progress Note  02/28/2018 4:11 PM Kyle Gonzales  MRN:  [id number] Subjective: Reports he is feeling better.  Denies suicidal ideations.  Currently denies medication side effects. Objective: I have discussed case with treatment team and have met with patient 27 year old male, lives with mother, presented to the hospital voluntarily due to worsening depression and passive suicidal ideations.  Stressors include parents in the process of divorcing, diagnoses of chronic liver disease. 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  Of note, a liver biopsy from April 2018 was reported as unremarkable hepatic parenchyma. Patient presents with a partially improved mood and a fuller range of affect today.  He has been visible on unit and noted to be interacting appropriately with other peers of around his age.  Cooperative on approach. Currently denies medication side effects. Denies suicidal ideations.  Principal Problem: Depression Diagnosis:   Patient Active Problem List   Diagnosis Date Noted  . MDD (major depressive disorder), recurrent severe, without psychosis ([place]) [F33.2] 02/26/2018  . Non-compliance [Z91.19] 01/14/2018  . No-show for appointment [Z53.29] 01/14/2018  . Current severe episode of major depressive disorder without psychotic features ([place]) [F32.2] 11/06/2017  . Transaminitis [R74.0] 04/08/2017  . Abnormal LFTs [R94.5] 04/08/2017  . Drug addiction / Marajuana ([place]) [F19.20] 01/06/2017  . Hyperlipidemia, mixed [E78.2] 01/11/2016  . Medication management [Z79.899] 01/11/2016  . Palpitations [R00.2] 12/04/2015  . Hypothyroidism [E03.9] 12/04/2015  . Graves disease [E05.00] 08/18/2015  . Vitamin D deficiency [E55.9] 08/11/2015   Total Time spent with patient: 20 minutes  Past Psychiatric History:   Past Medical History:  Past Medical History:  Diagnosis Date  . Graves disease 08/18/2015  . Hyperlipidemia   . Hypothyroid   .  Palpitations   . Vitamin D deficiency     Past Surgical History:  Procedure Laterality Date  . ANTERIOR CRUCIATE LIGAMENT REPAIR  2012  . WISDOM TOOTH EXTRACTION     Family History:  Family History  Problem Relation Age of Onset  . Diabetes Mother   . Colon cancer Maternal Grandmother   . Stomach cancer Maternal Grandmother   . Diabetes Maternal Grandmother   . Stomach cancer Maternal Grandfather   . Diabetes Maternal Grandfather    Family Psychiatric  History:  Social History:  Social History   Substance and Sexual Activity  Alcohol Use No     Social History   Substance and Sexual Activity  Drug Use Yes  . Types: Marijuana    Social History   Socioeconomic History  . Marital status: Single    Spouse name: Not on file  . Number of children: Not on file  . Years of education: Not on file  . Highest education level: Not on file  Occupational History  . Not on file  Social Needs  . Financial resource strain: Not on file  . Food insecurity:    Worry: Not on file    Inability: Not on file  . Transportation needs:    Medical: Not on file    Non-medical: Not on file  Tobacco Use  . Smoking status: Current Some Day Smoker  . Smokeless tobacco: Never Used  . Tobacco comment: Marijuana  Substance and Sexual Activity  . Alcohol use: No  . Drug use: Yes    Types: Marijuana  . Sexual activity: Yes  Lifestyle  . Physical activity:    Days per week: Not on file    Minutes per session: Not on file  . Stress:  Not on file  Relationships  . Social connections:    Talks on phone: Not on file    Gets together: Not on file    Attends religious service: Not on file    Active member of club or organization: Not on file    Attends meetings of clubs or organizations: Not on file    Relationship status: Not on file  Other Topics Concern  . Not on file  Social History Narrative  . Not on file   Additional Social History:   Sleep: Good  Appetite:  Good  Current  Medications: Current Facility-Administered Medications  Medication Dose Route Frequency Provider Last Rate Last Dose  . alum & mag hydroxide-simeth (MAALOX/MYLANTA) 200-200-20 MG/5ML suspension 30 mL  30 mL Oral Q4H PRN Ethelene Hal, NP      . bisoprolol-hydrochlorothiazide [facility]) 10-6.25 MG per tablet 1 tablet  1 tablet Oral Daily Ethelene Hal, NP   1 tablet at 02/28/18 0800  . cholecalciferol (VITAMIN D) tablet 10,000 Units  10,000 Units Oral Daily Ethelene Hal, NP   10,000 Units at 02/28/18 0800  . ezetimibe (ZETIA) tablet 10 mg  10 mg Oral Daily Ethelene Hal, NP   10 mg at 02/28/18 0800  . famotidine (PEPCID) tablet 20 mg  20 mg Oral Daily Ethelene Hal, NP   20 mg at 02/28/18 0800  . hydrOXYzine (ATARAX/VISTARIL) tablet 25 mg  25 mg Oral TID PRN Ethelene Hal, NP   25 mg at 02/27/18 2240  . levothyroxine (SYNTHROID, LEVOTHROID) tablet 75 mcg  75 mcg Oral Daily Ethelene Hal, NP   75 mcg at 02/28/18 7510  . LORazepam (ATIVAN) tablet 1 mg  1 mg Oral PRN Ethelene Hal, NP       And  . ziprasidone (GEODON) injection 20 mg  20 mg Intramuscular PRN Ethelene Hal, NP      . magnesium hydroxide (MILK OF MAGNESIA) suspension 30 mL  30 mL Oral Daily PRN Ethelene Hal, NP      . sertraline (ZOLOFT) tablet 100 mg  100 mg Oral QHS Ethelene Hal, NP   100 mg at 02/27/18 2240  . traZODone (DESYREL) tablet 50 mg  50 mg Oral QHS PRN Ethelene Hal, NP   50 mg at 02/27/18 2240    Lab Results: No results found for this or any previous visit (from the past 48 hour(s)).  Blood Alcohol level:  Lab Results  Component Value Date   ETH <10 25/85/2778    Metabolic Disorder Labs: Lab Results  Component Value Date   HGBA1C 5.5 02/04/2018   MPG 111 02/04/2018   MPG 111 10/02/2017   No results found for: PROLACTIN Lab Results  Component Value Date   CHOL 183 02/04/2018   TRIG 124 02/04/2018   HDL 59  02/04/2018   CHOLHDL 3.1 02/04/2018   VLDL 21 04/09/2017   LDLCALC 101 (H) 02/04/2018   LDLCALC 162 (H) 10/02/2017    Physical Findings: AIMS: Facial and Oral Movements Muscles of Facial Expression: None, normal Lips and Perioral Area: None, normal Jaw: None, normal Tongue: None, normal,Extremity Movements Upper (arms, wrists, hands, fingers): None, normal Lower (legs, knees, ankles, toes): None, normal, Trunk Movements Neck, shoulders, hips: None, normal, Overall Severity Severity of abnormal movements (highest score from questions above): None, normal Incapacitation due to abnormal movements: None, normal Patient's awareness of abnormal movements (rate only patient's report): No Awareness, Dental Status Current problems with teeth and/or  dentures?: No Does patient usually wear dentures?: No  CIWA:    COWS:     Musculoskeletal: Strength & Muscle Tone: within normal limits Gait & Station: normal Patient leans: N/A  Psychiatric Specialty Exam: Physical Exam  ROS no headache, no chest pain, no vomiting, no diarrhea, no pruritus, no fever, no chills  Blood pressure 125/77, pulse 84, temperature 98.9 F (37.2 C), temperature source Oral, resp. rate 16, height 6' 3"  (1.905 m), weight 101.6 kg (224 lb).Body mass index is 28 kg/m.  General Appearance: Fairly Groomed  Eye Contact:  Good  Speech:  Normal Rate  Volume:  Normal  Mood:  Mood presents less depressed today  Affect:  More reactive, smiles at times appropriately  Thought Process:  Linear and Descriptions of Associations: Intact  Orientation:  Full (Time, Place, and Person)  Thought Content:  No hallucinations, no delusions, less intensely ruminative about stressors  Suicidal Thoughts:  No today denies suicidal ideations, denies self-injurious ideations, is able to contract for safety on unit, no homicidal or violent ideations  Homicidal Thoughts:  No  Memory:  Recent and remote grossly intact  Judgement:   Fair-improving  Insight:  Fair-improving  Psychomotor Activity:  Normal  Concentration:  Concentration: Good and Attention Span: Good  Recall:  Good  Fund of Knowledge:  Good  Language:  Good  Akathisia:  Negative  Handed:  Right  AIMS (if indicated):     Assets:  Communication Skills Desire for Improvement Resilience  ADL's:  Intact  Cognition:  WNL  Sleep:  Number of Hours: 6.25   Assessment-patient presents with partially improved mood and range of affect today.  He denies suicidal ideations.  He is participative in milieu.  Tolerating Zoloft trial well thus far, currently does not endorse side effects.  Of note reports that a prior diagnosis of primary cholangitis has been a major stressor and expresses thinking that he will need a liver transplant a few years from now or possibly die.  However , April 2018 liver biopsy reported as unremarkable.  I have encouraged him to follow-up with his primary care to talk to primary care doctor for continuing monitoring and review.   Treatment Plan Summary: Daily contact with patient to assess and evaluate symptoms and progress in treatment, Medication management, Plan Inpatient treatment and Medications as below Encourage  group and milieu participation to work on coping skills and symptom reduction Continue Zoloft 100 mg daily for depression and anxiety Continue trazodone 50 mg nightly as needed for insomnia as needed Continue Vistaril 25 mg every 8 hours as needed for anxiety as needed Continue Synthroid 75 mcg daily for hypothyroidism Treatment team working on disposition [place], MD 02/28/2018, 4:11 PM

## 2018-02-28 NOTE — Progress Notes (Signed)
Adult Psychoeducational Group Note  Date:  02/28/2018 Time:  5:32 PM  Group Topic/Focus:  Coping With Mental Health Crisis:   The purpose of this group is to help patients identify strategies for coping with mental health crisis.  Group discusses possible causes of crisis and ways to manage them effectively.  Participation Level:  Active  Participation Quality:  Appropriate  Affect:  Appropriate  Cognitive:  Oriented  Insight: Good  Engagement in Group:  Engaged  Modes of Intervention:  Discussion  Additional Comments:  Patient was active in group  [place], [place] 02/28/2018, 5:32 PM

## 2018-02-28 NOTE — Progress Notes (Signed)
Pt presents with a flat affect and anxious mood. Pt reports decreased depression and anxiety today. Pt denies SI/HI. Pt expressed that he slept better last night compared to the other night.  Pt reports that he haven't been able to come up with any coping skills for managing his anger or depression and plans to work on it today. Pt stated goal for today is to "stay positive and work on coping skills".   Medications reviewed with pt. Verbal support provided. Pt encouraged to attend groups. 15 minute checks performed for safety. Will continue to monitor B/P.   Pt compliant with tx plan. No side effects to meds verbalized by pt. No concerns verbalized by pt.

## 2018-02-28 NOTE — Progress Notes (Addendum)
Patient ID: Kyle Gonzales, male   DOB: 19-Feb-1991, 27 y.o.   MRN: [id number]  Pt currently presents with a flat affect and appropriate behavior. Pt seen interacting poitively with peers, attends group tonight. Pt reports to Probation officer that his goal is to go home soon. States his plan for discharge is to keep talking to other people and "keep using coping skills for my triggers." When asked what triggers he has identified, pt states "I know them but I don't want to talk about them." Pt reports difficulty falling asleep with current medication regimen.   Pt provided with medications per providers orders. Pt's labs and vitals were monitored throughout the night. Pt given a 1:1 about emotional and mental status. Pt supported and encouraged to express concerns and questions. Pt educated on medications and assertiveness techniques.   Pt's safety ensured with 15 minute and environmental checks. Pt currently denies SI/HI and A/V hallucinations. Pt verbally agrees to seek staff if SI/HI or A/VH occurs and to consult with staff before acting on any harmful thoughts. Will continue POC.

## 2018-02-28 NOTE — BHH Group Notes (Signed)
[facility] LCSW Group Therapy Note  Date/Time: 02/28/18, 1315  Type of Therapy/Topic:  Group Therapy:  Balance in Life  Participation Level:  minimal  Description of Group:    This group will address the concept of balance and how it feels and looks when one is unbalanced. Patients will be encouraged to process areas in their lives that are out of balance, and identify reasons for remaining unbalanced. Facilitators will guide patients utilizing problem- solving interventions to address and correct the stressor making their life unbalanced. Understanding and applying boundaries will be explored and addressed for obtaining  and maintaining a balanced life. Patients will be encouraged to explore ways to assertively make their unbalanced needs known to significant others in their lives, using other group members and facilitator for support and feedback.  Therapeutic Goals: 1. Patient will identify two or more emotions or situations they have that consume much of in their lives. 2. Patient will identify signs/triggers that life has become out of balance:  3. Patient will identify two ways to set boundaries in order to achieve balance in their lives:  4. Patient will demonstrate ability to communicate their needs through discussion and/or role plays  Summary of Patient Progress:Pt identified friends and mental/emotional as areas of his life that are out of balance.  Pt did not participate in group discussion, but did answer CSW questions.          Therapeutic Modalities:   Cognitive Behavioral Therapy Solution-Focused Therapy Assertiveness Training  Lurline Idol, [place]

## 2018-03-01 MED ORDER — TRAZODONE HCL 50 MG PO TABS: 50.0000 mg | ORAL_TABLET | Freq: Every evening | ORAL | 0 refills | Status: DC | PRN

## 2018-03-01 MED ORDER — HYDROXYZINE HCL 25 MG PO TABS: 25.0000 mg | ORAL_TABLET | Freq: Three times a day (TID) | ORAL | 0 refills | Status: DC | PRN

## 2018-03-01 MED ORDER — SERTRALINE HCL 100 MG PO TABS: 100.0000 mg | ORAL_TABLET | Freq: Every day | ORAL | 0 refills | Status: DC

## 2018-03-01 NOTE — Progress Notes (Signed)
Recreation Therapy Notes  Date: 5.17.19 Time: 0930 Location: [address] Dayroom  Group Topic: Stress Management  Goal Area(s) Addresses:  Patient will verbalize importance of using healthy stress management.  Patient will identify positive emotions associated with healthy stress management.   Intervention: Stress Management  Activity :  Progressive Muscle Relaxation.  LRT lead patients through the process of tensing each muscle group then releasing the tension.  Patients were to follow along as LRT read script to guide patients through the process.  Education:  Stress Management, Discharge Planning.   Education Outcome: Acknowledges edcuation/In group clarification offered/Needs additional education  Clinical Observations/Feedback: Pt did not attend group.    Victorino Sparrow, LRT/CTRS         Victorino Sparrow A 03/01/2018 12:17 PM

## 2018-03-01 NOTE — Progress Notes (Signed)
Discharge note: RN reviewed all discharge paperwork with patient and he verbalized understanding. Prescriptions given to patient, all questions answered. All belongings were returned to patient and was walked to the lobby where his mother was waiting. Patient thanked staff for everything during his stay.

## 2018-03-01 NOTE — Discharge Summary (Addendum)
Physician Discharge Summary Note  Patient:  Kyle Gonzales is an 27 y.o., male MRN:  [id number] DOB:  06-Dec-1990 Patient phone:  [phone] (home)  Patient address:   [address] [place] [address],  Total Time spent with patient: 30 minutes  Date of Admission:  02/26/2018 Date of Discharge: 03/01/2018  Reason for Admission:  27 year old single male, lives with mother . Presented to hospital voluntarily, due to worsening depression, passive suicidal ideations. States he has been facing significant stressors which have contributed to depression, including chronic liver disease and his  parents being in the process of divorcing ( states father left home a few months ago), but states he has been struggling with intermittent depression for several years. He reports he has had intermittent suicidal ideations, which he describes as passive " like thoughts of dying", over the last few months . Endorses some neuro-vegetative symptoms as below. Denies psychotic symptoms. In addition to depression he also reports history suggestive of Intermittent Explosive Disorder, describes episodic angry outbursts,explosiveness of short duration Associated Signs/Symptoms: Depression Symptoms:  depressed mood, insomnia, suicidal thoughts without plan, anxiety, decreased appetite, some unspecified weight loss  (Hypo) Manic Symptoms:  None noted or endorsed Anxiety Symptoms:  Reports increased anxiety and worry, denies panic attacks Psychotic Symptoms:  Denies  PTSD Symptoms: denies     Principal Problem: <principal problem not specified> Discharge Diagnoses: Patient Active Problem List   Diagnosis Date Noted  . MDD (major depressive disorder), recurrent severe, without psychosis ([place]) [F33.2] 02/26/2018  . Non-compliance [Z91.19] 01/14/2018  . No-show for appointment [Z53.29] 01/14/2018  . Current severe episode of major depressive disorder without psychotic features ([place]) [F32.2] 11/06/2017  .  Transaminitis [R74.0] 04/08/2017  . Abnormal LFTs [R94.5] 04/08/2017  . Drug addiction / Marajuana ([place]) [F19.20] 01/06/2017  . Hyperlipidemia, mixed [E78.2] 01/11/2016  . Medication management [Z79.899] 01/11/2016  . Palpitations [R00.2] 12/04/2015  . Hypothyroidism [E03.9] 12/04/2015  . Graves disease [E05.00] 08/18/2015  . Vitamin D deficiency [E55.9] 08/11/2015    Past Psychiatric History: No prior psychiatric admissions, has never attempted suicide , denies history of self cutting, does not endorse any clear history of mania or hypomania, describes symptoms suggestive of Intermittent Explosive Disorder, which he describes as brief episodes of anger, explosiveness of short duration ( minutes ) after which he often feels guilty and apologetic . Denies history of psychosis. Reports increasing anxiety over recent weeks to months, denies panic attacks, no agoraphobia  Substance Abuse History in the last 12 months: denies alcohol abuse , endorses cannabis abuse, uses regularly, no other drug abuse . Consequences of Substance Abuse: Does not endorse  Previous Psychotropic Medications: had been on Celexa but felt it did not help, about three weeks started Zoloft. Denies side effects    Past Medical History:  Past Medical History:  Diagnosis Date  . Graves disease 08/18/2015  . Hyperlipidemia   . Hypothyroid   . Palpitations   . Vitamin D deficiency     Past Surgical History:  Procedure Laterality Date  . ANTERIOR CRUCIATE LIGAMENT REPAIR  2012  . WISDOM TOOTH EXTRACTION     Family History:  Family History  Problem Relation Age of Onset  . Diabetes Mother   . Colon cancer Maternal Grandmother   . Stomach cancer Maternal Grandmother   . Diabetes Maternal Grandmother   . Stomach cancer Maternal Grandfather   . Diabetes Maternal Grandfather    Family Psychiatric  History: denies psychiatric illness in family, no suicides in family  Social History:  Social History    Substance and Sexual Activity  Alcohol Use No     Social History   Substance and Sexual Activity  Drug Use Yes  . Types: Marijuana    Social History   Socioeconomic History  . Marital status: Single    Spouse name: Not on file  . Number of children: Not on file  . Years of education: Not on file  . Highest education level: Not on file  Occupational History  . Not on file  Social Needs  . Financial resource strain: Not on file  . Food insecurity:    Worry: Not on file    Inability: Not on file  . Transportation needs:    Medical: Not on file    Non-medical: Not on file  Tobacco Use  . Smoking status: Current Some Day Smoker  . Smokeless tobacco: Never Used  . Tobacco comment: Marijuana  Substance and Sexual Activity  . Alcohol use: No  . Drug use: Yes    Types: Marijuana  . Sexual activity: Yes  Lifestyle  . Physical activity:    Days per week: Not on file    Minutes per session: Not on file  . Stress: Not on file  Relationships  . Social connections:    Talks on phone: Not on file    Gets together: Not on file    Attends religious service: Not on file    Active member of club or organization: Not on file    Attends meetings of clubs or organizations: Not on file    Relationship status: Not on file  Other Topics Concern  . Not on file  Social History Narrative  . Not on file   Hospital Course:   Kyle Gonzales was admitted for <principal problem not specified> and crisis management.  He was treated with the following medications zoloft 100mg  po daily for depression, Trazodone 50mg  po for insomnia, Hydroxyzine 25mg  po for anxiety.  Kyle Gonzales was discharged with current medication and was instructed on how to take medications as prescribed; (details listed below under Medication List).  Medical problems were identified and treated as needed.  Home medications were restarted as appropriate.  Labs were reviewed and assessed upon admission to the ED determined to  be abnormal include alkaline phosphatase at 169, ALT at 111, GGT at 730, decreased platelet count at 145, decreased lymphocyte count at 817, and UDS positive for THC.  Improvement was monitored by observation and Kyle Gonzales daily report of symptom reduction.  Emotional and mental status was monitored by daily self-inventory reports completed by Kyle Gonzales and clinical staff.         Kyle Gonzales was evaluated by the treatment team for stability and plans for continued recovery upon discharge.  Kyle Gonzales motivation was an integral factor for scheduling further treatment.  Employment, transportation, bed availability, health status, family support, and any pending legal issues were also considered during his hospital stay.  He was offered further treatment options upon discharge including but not limited to Residential, Intensive Outpatient, and Outpatient treatment.  Kyle Gonzales will follow up with the services as listed below under Follow Up Information.     Upon completion of this admission the Kyle Gonzales was both mentally and medically stable for discharge denying suicidal/homicidal ideation, auditory/visual/tactile hallucinations, delusional thoughts and paranoia.     Physical Findings: AIMS: Facial and Oral Movements Muscles of Facial Expression: None, normal Lips and Perioral Area: None, normal Jaw: None,  normal Tongue: None, normal,Extremity Movements Upper (arms, wrists, hands, fingers): None, normal Lower (legs, knees, ankles, toes): None, normal, Trunk Movements Neck, shoulders, hips: None, normal, Overall Severity Severity of abnormal movements (highest score from questions above): None, normal Incapacitation due to abnormal movements: None, normal Patient's awareness of abnormal movements (rate only patient's report): No Awareness, Dental Status Current problems with teeth and/or dentures?: No Does patient usually wear dentures?: No  CIWA:    COWS:      Musculoskeletal: Strength & Muscle Tone: within normal limits Gait & Station: normal Patient leans: N/A  Psychiatric Specialty Exam: Physical Exam  ROS  Blood pressure 122/78, pulse 93, temperature 98.6 F (37 C), temperature source Oral, resp. rate 18, height 6\' 3"  (1.905 m), weight 101.6 kg (224 lb).Body mass index is 28 kg/m.  Sleep:  Number of Hours: 5.75     Have you used any form of tobacco in the last 30 days? (Cigarettes, Smokeless Tobacco, Cigars, and/or Pipes): Yes  Has this patient used any form of tobacco in the last 30 days? (Cigarettes, Smokeless Tobacco, Cigars, and/or Pipes)  Yes, A prescription for an FDA-approved tobacco cessation medication was offered at discharge and the patient refused  Blood Alcohol level:  Lab Results  Component Value Date   ETH <10 71/03/2693    Metabolic Disorder Labs:  Lab Results  Component Value Date   HGBA1C 5.5 02/04/2018   MPG 111 02/04/2018   MPG 111 10/02/2017   No results found for: PROLACTIN Lab Results  Component Value Date   CHOL 183 02/04/2018   TRIG 124 02/04/2018   HDL 59 02/04/2018   CHOLHDL 3.1 02/04/2018   VLDL 21 04/09/2017   LDLCALC 101 (H) 02/04/2018   LDLCALC 162 (H) 10/02/2017    See Psychiatric Specialty Exam and Suicide Risk Assessment completed by Attending Physician prior to discharge.  Discharge destination:  Home  Is patient on multiple antipsychotic therapies at discharge:  No   Has Patient had three or more failed trials of antipsychotic monotherapy by history:  No  Recommended Plan for Multiple Antipsychotic Therapies: NA  Discharge Instructions    Discharge instructions   Complete by:  As directed    Please continue to take medications as directed. If your symptoms return, worsen, or persist please call your 911, report to local ER, or contact crisis hotline. Please do not drink alcohol or use any illegal substances while taking prescription medications.     Allergies as of  03/01/2018      Reactions   Tylenol [acetaminophen]    r/t elevated LFTs      Medication List    STOP taking these medications   citalopram 40 MG tablet Commonly known as:  CELEXA   ondansetron 4 MG tablet Commonly known as:  ZOFRAN     TAKE these medications     Indication  bisoprolol-hydrochlorothiazide 10-6.25 MG tablet Commonly known as:  ZIAC Take 1 tablet by mouth daily.  Indication:  High Blood Pressure Disorder   ezetimibe 10 MG tablet Commonly known as:  ZETIA Take 1 tablet daily for Cholesterol  Indication:  High Amount of Fats in the Blood   hydrOXYzine 25 MG tablet Commonly known as:  ATARAX/VISTARIL Take 1 tablet (25 mg total) by mouth 3 (three) times daily as needed for anxiety.  Indication:  Feeling Anxious   levothyroxine 75 MCG tablet Commonly known as:  SYNTHROID, LEVOTHROID take 1 tablet by mouth every morning ON AN EMPTY STOMACH  Indication:  Underactive Thyroid  omeprazole 40 MG capsule Commonly known as:  PRILOSEC Take 1 capsule (40 mg total) by mouth daily.  Indication:  Heartburn   ranitidine 300 MG tablet Commonly known as:  ZANTAC Take 1 tablet as needed for heartburn.  Do not exceed 2 tablets in 24 hours.  Indication:  Stomach Ulcer, Gastroesophageal Reflux Disease   sertraline 100 MG tablet Commonly known as:  ZOLOFT Take 1 tablet (100 mg total) by mouth at bedtime. What changed:    medication strength  how much to take  how to take this  when to take this  additional instructions  Indication:  Major Depressive Disorder   traZODone 50 MG tablet Commonly known as:  DESYREL Take 1 tablet (50 mg total) by mouth at bedtime as needed for sleep.  Indication:  Trouble Sleeping   Vitamin D3 5000 units Caps Take 10,000 Units by mouth daily.  Indication:  vitamin d deficiency      Follow-up Information    [facility] Follow up.   Why:  Appointment is  Contact information: [address]  [place], [place] [address]  [phone] [phone]        [facility] Follow up.   Specialty:  Behavioral Health Why:  Appointment is  Contact information: [address] [place] [phone]          Follow-up recommendations:  Activity:  Increase activity as tolerated. Diet:  Routine house diet as directed Tests:  Routine testing as suggested by outpatient psychiatrist.  No additional testing warranted at this time.  Elevated liver enzymes, will need to be repeated by outpatient psychiatrist, and consider possible referral to hepatology clinic. Other:  Even if you begin to feel better continue taking medications as directed.  Only stop if under the care of psychiatry or prescriber.   Signed: Nanci Pina, FNP 03/01/2018, 10:26 AM   Patient seen, Suicide Assessment Completed.  Disposition Plan Reviewed

## 2018-03-01 NOTE — Progress Notes (Addendum)
  [facility] Adult Case Management Discharge Plan :  Will you be returning to the same living situation after discharge:  Yes,  patient is returning home with his mother At discharge, do you have transportation home?: Yes,  patient reports his mother will pick him up at discharge Do you have the ability to pay for your medications: Yes,  [employer], support from parent  Release of information consent forms completed and in the chart;  Patient's signature needed at discharge.  Patient to Follow up at: Follow-up Information    [facility]. Go on 03/21/2018.   Specialty:  Behavioral Health Why:   Appointment is 03/21/18 at 10:00am.  Contact information: [address] [place] [phone]          Next level of care provider has access to [place] and Suicide Prevention discussed: Yes,  with the patient's mother  Have you used any form of tobacco in the last 30 days? (Cigarettes, Smokeless Tobacco, Cigars, and/or Pipes): Yes  Has patient been referred to the Quitline?: Patient refused referral  Patient has been referred for addiction treatment: N/A  Marylee Floras, [place] 03/01/2018, 11:54 AM

## 2018-03-01 NOTE — BHH Suicide Risk Assessment (Signed)
[facility] Discharge Suicide Risk Assessment   Principal Problem: depression Discharge Diagnoses:  Patient Active Problem List   Diagnosis Date Noted  . MDD (major depressive disorder), recurrent severe, without psychosis ([place]) [F33.2] 02/26/2018  . Non-compliance [Z91.19] 01/14/2018  . No-show for appointment [Z53.29] 01/14/2018  . Current severe episode of major depressive disorder without psychotic features ([place]) [F32.2] 11/06/2017  . Transaminitis [R74.0] 04/08/2017  . Abnormal LFTs [R94.5] 04/08/2017  . Drug addiction / Marajuana ([place]) [F19.20] 01/06/2017  . Hyperlipidemia, mixed [E78.2] 01/11/2016  . Medication management [Z79.899] 01/11/2016  . Palpitations [R00.2] 12/04/2015  . Hypothyroidism [E03.9] 12/04/2015  . Graves disease [E05.00] 08/18/2015  . Vitamin D deficiency [E55.9] 08/11/2015    Total Time spent with patient: 30 minutes  Musculoskeletal: Strength & Muscle Tone: within normal limits Gait & Station: normal Patient leans: N/A  Psychiatric Specialty Exam: ROS no headache, no chest pain, no shortness of breath, no vomiting, no diarrhea, no fever, no chills   Blood pressure 122/78, pulse 93, temperature 98.6 F (37 C), temperature source Oral, resp. rate 18, height 6\' 3"  (1.905 m), weight 101.6 kg (224 lb).Body mass index is 28 kg/m.  General Appearance: Well Groomed  Eye Contact::  Good  Speech:  Normal Rate409  Volume:  Normal  Mood:  patient reports his mood is " pretty good",presents euthymic  Affect:  Appropriate and Full Range  Thought Process:  Linear and Descriptions of Associations: Intact  Orientation:  Full (Time, Place, and Person)  Thought Content:  no hallucinations, no delusions, not internally preoccupied   Suicidal Thoughts:  No denies any suicidal or self injurious ideations, denies any homicidal or violent ideations   Homicidal Thoughts:  No  Memory:  recent and remote grossly intact   Judgement:  Other:  improved   Insight:  improved    Psychomotor Activity:  Normal  Concentration:  Good  Recall:  Good  Fund of Knowledge:Good  Language: Good  Akathisia:  Negative  Handed:  Right  AIMS (if indicated):     Assets:  Communication Skills Desire for Improvement Resilience  Sleep:  Number of Hours: 5.75  Cognition: WNL  ADL's:  Intact   Mental Status Per Nursing Assessment::   On Admission:     Demographic Factors:  27 year old single no children, lives with mother, currently unemployed   Loss Factors: unemployment, concern about having a chronic liver disease   Historical Factors: No prior psychiatric admissions, no history of suicide attempts, history of depression  Risk Reduction Factors:   Sense of responsibility to family and Positive coping skills or problem solving skills  Continued Clinical Symptoms:  At this time patient is alert, attentive, well groomed,mood improved and currently denies depression, affect reactive, fuller in range, no thought disorder , no suicidal or self injurious ideations, no homicidal or violent ideations, no hallucinations, no delusions, future oriented , states he is planning to look for jobs after discharging . Denies medication side effects. We have reviewed side effect profile, to include risk of sexual dysfunction. Behavior on unit in good control, pleasant on approach.  Cognitive Features That Contribute To Risk:  No gross cognitive deficits noted upon discharge. Is alert , attentive, and oriented x 3     Suicide Risk:  Mild:  Suicidal ideation of limited frequency, intensity, duration, and specificity.  There are no identifiable plans, no associated intent, mild dysphoria and related symptoms, good self-control (both objective and subjective assessment), few other risk factors, and identifiable protective factors, including available  and accessible social support.  Follow-up Information    [facility]. Go on 03/21/2018.    Specialty:  Behavioral Health Why:   Appointment is 03/21/18 at 10:00am.  Contact information: [address] [place] [phone]          Plan Of Care/Follow-up recommendations:  Activity:  as tolerated  Diet:  Regular  Tests:  NA Other:  See below Patient is expressing readiness for discharge and is leaving unit in good spirits States mother is picking him up later today Plans to follow up as above  He has a GI specialist, Dr. Enis Gash for monitoring of chronic liver disease . Jenne Campus, MD 03/01/2018, 1:09 PM

## 2018-03-01 NOTE — Progress Notes (Signed)
Patient self inventory: Patient slept fair, sleep medication helped. Appetite has been fair, , energy level has been normal, concentration has been good. Depression is rated at 0, hopelessness rated as 0, and anxiety rated at 2. Denies SI/HI/ADH. Patient's goal is to "focus on getting out of here."  Patient compliant with medication administration. Safety maintained with 15 minute checks.

## 2018-03-19 NOTE — Progress Notes (Deleted)
[facility] MD/PA/NP OP Progress Note  03/19/2018 4:25 PM Kyle Gonzales  MRN:  [id number]  Chief Complaint:  HPI:  - He was admitted to [facility] voluntary for worsening depression, passive SI. Sertraline was uptitrated.   MRI 11/2017 Stable mild dilatation and irregularity of intrahepatic bile ducts, consistent with sclerosing cholangitis. No hepatic neoplasm identified.  Colon biopsy 06/2017 Surgical [P], transverse and right colon and left colon - BENIGN COLONIC MUCOSA. - NO SIGNIFICANT INFLAMMATION OR OTHER ABNORMALITIES IDENTIFIED  Liver, needle/core biopsy, right lobe 01/2017 - ESSENTIALLY UNREMARKABLE HEPATIC PARENCHYMA. - SEE COMMENT.  Visit Diagnosis: No diagnosis found.  Past Psychiatric History: Please see initial evaluation for full details. I have reviewed the history. No updates at this time.     Past Medical History:  Past Medical History:  Diagnosis Date  . Graves disease 08/18/2015  . Hyperlipidemia   . Hypothyroid   . Palpitations   . Vitamin D deficiency     Past Surgical History:  Procedure Laterality Date  . ANTERIOR CRUCIATE LIGAMENT REPAIR  2012  . WISDOM TOOTH EXTRACTION      Family Psychiatric History: Please see initial evaluation for full details. I have reviewed the history. No updates at this time.     Family History:  Family History  Problem Relation Age of Onset  . Diabetes Mother   . Colon cancer Maternal Grandmother   . Stomach cancer Maternal Grandmother   . Diabetes Maternal Grandmother   . Stomach cancer Maternal Grandfather   . Diabetes Maternal Grandfather     Social History:  Social History   Socioeconomic History  . Marital status: Single    Spouse name: Not on file  . Number of children: Not on file  . Years of education: Not on file  . Highest education level: Not on file  Occupational History  . Not on file  Social Needs  . Financial resource strain: Not on file  . Food insecurity:    Worry: Not on file    Inability: Not  on file  . Transportation needs:    Medical: Not on file    Non-medical: Not on file  Tobacco Use  . Smoking status: Current Some Day Smoker  . Smokeless tobacco: Never Used  . Tobacco comment: Marijuana  Substance and Sexual Activity  . Alcohol use: No  . Drug use: Yes    Types: Marijuana  . Sexual activity: Yes  Lifestyle  . Physical activity:    Days per week: Not on file    Minutes per session: Not on file  . Stress: Not on file  Relationships  . Social connections:    Talks on phone: Not on file    Gets together: Not on file    Attends religious service: Not on file    Active member of club or organization: Not on file    Attends meetings of clubs or organizations: Not on file    Relationship status: Not on file  Other Topics Concern  . Not on file  Social History Narrative  . Not on file    Allergies:  Allergies  Allergen Reactions  . Tylenol [Acetaminophen]     r/t elevated LFTs    Metabolic Disorder Labs: Lab Results  Component Value Date   HGBA1C 5.5 02/04/2018   MPG 111 02/04/2018   MPG 111 10/02/2017   No results found for: PROLACTIN Lab Results  Component Value Date   CHOL 183 02/04/2018   TRIG 124 02/04/2018   HDL 59  02/04/2018   CHOLHDL 3.1 02/04/2018   VLDL 21 04/09/2017   LDLCALC 101 (H) 02/04/2018   LDLCALC 162 (H) 10/02/2017   Lab Results  Component Value Date   TSH 3.14 02/04/2018   TSH 1.06 10/02/2017    Therapeutic Level Labs: No results found for: LITHIUM No results found for: VALPROATE No components found for:  CBMZ  Current Medications: Current Outpatient Medications  Medication Sig Dispense Refill  . bisoprolol-hydrochlorothiazide (ZIAC) 10-6.25 MG tablet Take 1 tablet by mouth daily. 90 tablet 0  . Cholecalciferol (VITAMIN D3) 5000 units CAPS Take 10,000 Units by mouth daily.    Marland Kitchen ezetimibe (ZETIA) 10 MG tablet Take 1 tablet daily for Cholesterol 90 tablet 0  . hydrOXYzine (ATARAX/VISTARIL) 25 MG tablet Take 1 tablet  (25 mg total) by mouth 3 (three) times daily as needed for anxiety. 30 tablet 0  . levothyroxine (SYNTHROID, LEVOTHROID) 75 MCG tablet take 1 tablet by mouth every morning ON AN EMPTY STOMACH 90 tablet 1  . omeprazole (PRILOSEC) 40 MG capsule Take 1 capsule (40 mg total) by mouth daily. 30 capsule 1  . ranitidine (ZANTAC) 300 MG tablet Take 1 tablet as needed for heartburn.  Do not exceed 2 tablets in 24 hours. 90 tablet 1  . sertraline (ZOLOFT) 100 MG tablet Take 1 tablet (100 mg total) by mouth at bedtime. 30 tablet 0  . traZODone (DESYREL) 50 MG tablet Take 1 tablet (50 mg total) by mouth at bedtime as needed for sleep. 30 tablet 0   No current facility-administered medications for this visit.      Musculoskeletal: Strength & Muscle Tone: within normal limits Gait & Station: normal Patient leans: N/A  Psychiatric Specialty Exam: ROS  There were no vitals taken for this visit.There is no height or weight on file to calculate BMI.  General Appearance: Fairly Groomed  Eye Contact:  Good  Speech:  Clear and Coherent  Volume:  Normal  Mood:  {BHH MOOD:22306}  Affect:  {Affect (PAA):22687}  Thought Process:  Coherent  Orientation:  Full (Time, Place, and Person)  Thought Content: Logical   Suicidal Thoughts:  {ST/HT (PAA):22692}  Homicidal Thoughts:  {ST/HT (PAA):22692}  Memory:  Immediate;   Good  Judgement:  Good  Insight:  {Insight (PAA):22695}  Psychomotor Activity:  Normal  Concentration:  Concentration: Good and Attention Span: Good  Recall:  Good  Fund of Knowledge: Good  Language: Good  Akathisia:  No  Handed:  Right  AIMS (if indicated): not done  Assets:  Communication Skills Desire for Improvement  ADL's:  Intact  Cognition: WNL  Sleep:  {BHH GOOD/FAIR/POOR:22877}   Screenings: AIMS     Admission (Discharged) from 02/26/2018 in [place] [facility]  AIMS Total Score  0    AUDIT     Admission (Discharged) from 02/26/2018 in [place] [facility]  Alcohol Use Disorder Identification Test Final Score (AUDIT)  0    PHQ2-9     Office Visit from 02/04/2018 in [place] [facility] [place] Office Visit from 01/14/2018 in [place] [facility] [place] Office Visit from 11/06/2017 in [place] [facility] [place] Visit from 10/02/2017 in [place] [facility] [place] Office Visit from 04/09/2017 in [place] [facility]  PHQ-2 Total Score  0  4  4  0  0  PHQ-9 Total Score  -  8  19  -  -       Assessment and Plan:  Abdimalik Mayorquin is a 27 y.o. year old male with a history of depression, marijuana use, Hypertension, Hyperlipidemia, Graves' disease, Pre-Diabetes and Vitamin D Deficiency, r/o PSC under evaluation, who presents for follow up appointment for No diagnosis found.  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  Psychosocial stressors including diagnosis of PSC, relationship issues and his father left the house unexpectedly last year.  Although reported fatigue and anhedonia may be multifactorial given his physical condition, he would be a good candidate for antidepressant to treat neurovegetative symptoms. Will switch from Celexa to sertraline to target depression given patient reports limited benefit from Celexa.  May consider adding low-dose of Wellbutrin to target significant fatigue in the future. Discussed behavioral activation. Explored his value of connectedness with others. He will greatly benefit from CBT; will make a referral.   # Marijuana use disorder He is at precontemplative stage for marijuana use. Will continue motivational interview.    Plan 1. Decease citalopram 20 mg daily for one week, then discontinue 2. Start sertraline 25 mg at  night for one week, then 50 mg at night for one week, then 100 mg at night 3. Return to clinic in six weeks for 30 mins 4. Referral to therapy (on xanax 0.25-0.5 mg daily prn for anxiety)  The patient demonstrates the following risk factors for suicide: Chronic risk factors for suicide include: psychiatric disorder of depression and substance use disorder. Acute risk factors for suicide include: family or marital conflict and unemployment. Protective factors for this patient include: positive social support, coping skills and hope for the future. Considering these factors, the overall suicide risk at this point appears to be low. Patient is appropriate for outpatient follow up. He denies gun access at home     Norman Clay, MD 03/19/2018, 4:25 PM

## 2018-03-21 ENCOUNTER — Ambulatory Visit (HOSPITAL_COMMUNITY): Payer: 59 | Admitting: Psychiatry

## 2018-03-31 ENCOUNTER — Other Ambulatory Visit: Payer: Self-pay | Admitting: Internal Medicine

## 2018-03-31 ENCOUNTER — Other Ambulatory Visit: Payer: Self-pay | Admitting: Physician Assistant

## 2018-04-01 ENCOUNTER — Other Ambulatory Visit (HOSPITAL_COMMUNITY): Payer: Self-pay | Admitting: Psychiatry

## 2018-04-01 MED ORDER — SERTRALINE HCL 100 MG PO TABS: 100.0000 mg | ORAL_TABLET | Freq: Every day | ORAL | 0 refills | Status: DC

## 2018-04-01 NOTE — Progress Notes (Deleted)
[facility] MD/PA/NP OP Progress Note  04/01/2018 3:23 PM Kyle Gonzales  MRN:  [id number]  Chief Complaint:  HPI:  - He was admitted to [facility] voluntary for worsening depression, passive SI. Sertraline was uptitrated.   MRI 11/2017 Stable mild dilatation and irregularity of intrahepatic bile ducts, consistent with sclerosing cholangitis. No hepatic neoplasm identified.  Colon biopsy 06/2017 Surgical [P], transverse and right colon and left colon - BENIGN COLONIC MUCOSA. - NO SIGNIFICANT INFLAMMATION OR OTHER ABNORMALITIES IDENTIFIED  Liver, needle/core biopsy, right lobe 01/2017 - ESSENTIALLY UNREMARKABLE HEPATIC PARENCHYMA. - SEE COMMENT.  Visit Diagnosis: No diagnosis found.  Past Psychiatric History: Please see initial evaluation for full details. I have reviewed the history. No updates at this time.     Past Medical History:  Past Medical History:  Diagnosis Date  . Graves disease 08/18/2015  . Hyperlipidemia   . Hypothyroid   . Palpitations   . Vitamin D deficiency     Past Surgical History:  Procedure Laterality Date  . ANTERIOR CRUCIATE LIGAMENT REPAIR  2012  . WISDOM TOOTH EXTRACTION      Family Psychiatric History: Please see initial evaluation for full details. I have reviewed the history. No updates at this time.     Family History:  Family History  Problem Relation Age of Onset  . Diabetes Mother   . Colon cancer Maternal Grandmother   . Stomach cancer Maternal Grandmother   . Diabetes Maternal Grandmother   . Stomach cancer Maternal Grandfather   . Diabetes Maternal Grandfather     Social History:  Social History   Socioeconomic History  . Marital status: Single    Spouse name: Not on file  . Number of children: Not on file  . Years of education: Not on file  . Highest education level: Not on file  Occupational History  . Not on file  Social Needs  . Financial resource strain: Not on file  . Food insecurity:    Worry: Not on file    Inability: Not  on file  . Transportation needs:    Medical: Not on file    Non-medical: Not on file  Tobacco Use  . Smoking status: Current Some Day Smoker  . Smokeless tobacco: Never Used  . Tobacco comment: Marijuana  Substance and Sexual Activity  . Alcohol use: No  . Drug use: Yes    Types: Marijuana  . Sexual activity: Yes  Lifestyle  . Physical activity:    Days per week: Not on file    Minutes per session: Not on file  . Stress: Not on file  Relationships  . Social connections:    Talks on phone: Not on file    Gets together: Not on file    Attends religious service: Not on file    Active member of club or organization: Not on file    Attends meetings of clubs or organizations: Not on file    Relationship status: Not on file  Other Topics Concern  . Not on file  Social History Narrative  . Not on file    Allergies:  Allergies  Allergen Reactions  . Tylenol [Acetaminophen]     r/t elevated LFTs    Metabolic Disorder Labs: Lab Results  Component Value Date   HGBA1C 5.5 02/04/2018   MPG 111 02/04/2018   MPG 111 10/02/2017   No results found for: PROLACTIN Lab Results  Component Value Date   CHOL 183 02/04/2018   TRIG 124 02/04/2018   HDL 59  02/04/2018   CHOLHDL 3.1 02/04/2018   VLDL 21 04/09/2017   LDLCALC 101 (H) 02/04/2018   LDLCALC 162 (H) 10/02/2017   Lab Results  Component Value Date   TSH 3.14 02/04/2018   TSH 1.06 10/02/2017    Therapeutic Level Labs: No results found for: LITHIUM No results found for: VALPROATE No components found for:  CBMZ  Current Medications: Current Outpatient Medications  Medication Sig Dispense Refill  . bisoprolol-hydrochlorothiazide (ZIAC) 10-6.25 MG tablet Take 1 tablet by mouth daily. 90 tablet 0  . Cholecalciferol (VITAMIN D3) 5000 units CAPS Take 10,000 Units by mouth daily.    Marland Kitchen ezetimibe (ZETIA) 10 MG tablet Take 1 tablet daily for Cholesterol 90 tablet 0  . hydrOXYzine (ATARAX/VISTARIL) 25 MG tablet Take 1 tablet  (25 mg total) by mouth 3 (three) times daily as needed for anxiety. 30 tablet 0  . levothyroxine (SYNTHROID, LEVOTHROID) 75 MCG tablet take 1 tablet by mouth every morning ON AN EMPTY STOMACH 90 tablet 1  . omeprazole (PRILOSEC) 40 MG capsule Take 1 capsule (40 mg total) by mouth daily. Please schedule an Office visit for future refills: [phone] 30 capsule 2  . ranitidine (ZANTAC) 300 MG tablet Take 1 tablet as needed for heartburn.  Do not exceed 2 tablets in 24 hours. 90 tablet 1  . sertraline (ZOLOFT) 100 MG tablet Take 1 tablet (100 mg total) by mouth at bedtime. 30 tablet 0  . traZODone (DESYREL) 50 MG tablet Take 1 tablet (50 mg total) by mouth at bedtime as needed for sleep. 30 tablet 0   No current facility-administered medications for this visit.      Musculoskeletal: Strength & Muscle Tone: within normal limits Gait & Station: normal Patient leans: N/A  Psychiatric Specialty Exam: ROS  There were no vitals taken for this visit.There is no height or weight on file to calculate BMI.  General Appearance: Fairly Groomed  Eye Contact:  Good  Speech:  Clear and Coherent  Volume:  Normal  Mood:  {BHH MOOD:22306}  Affect:  {Affect (PAA):22687}  Thought Process:  Coherent  Orientation:  Full (Time, Place, and Person)  Thought Content: Logical   Suicidal Thoughts:  {ST/HT (PAA):22692}  Homicidal Thoughts:  {ST/HT (PAA):22692}  Memory:  Immediate;   Good  Judgement:  {Judgement (PAA):22694}  Insight:  {Insight (PAA):22695}  Psychomotor Activity:  Normal  Concentration:  Concentration: Good and Attention Span: Good  Recall:  Good  Fund of Knowledge: Good  Language: Good  Akathisia:  No  Handed:  Right  AIMS (if indicated): not done  Assets:  Communication Skills Desire for Improvement  ADL's:  Intact  Cognition: WNL  Sleep:  {BHH GOOD/FAIR/POOR:22877}   Screenings: AIMS     Admission (Discharged) from 02/26/2018 in [place] [facility]   AIMS Total Score  0    AUDIT     Admission (Discharged) from 02/26/2018 in [place] [facility]  Alcohol Use Disorder Identification Test Final Score (AUDIT)  0    PHQ2-9     Office Visit from 02/04/2018 in [place] [facility] [place] Office Visit from 01/14/2018 in [place] [facility] [place] Office Visit from 11/06/2017 in [place] [facility] [place] Office Visit from 10/02/2017 in [place] [facility] [place] Office Visit from 04/09/2017 in [place] [facility]  PHQ-2 Total Score  0  4  4  0  0  PHQ-9 Total Score  -  8  19  -  -  Assessment and Plan:  Kyle Gonzales is a 27 y.o. year old male with a history of depression, marijuana use, hypertension, hyperlipidemia,  Graves' disease, Pre-Diabetes and Vitamin D Deficiency, r/o PSC under evaluation , who presents for follow up appointment for No diagnosis found.  # MDD, moderate single episode without psychotic features # r/o marijuana induced mood disorder Exam is notable for some delay in speech with blunt affect and patient endorses neurovegetative symptoms.  Psychosocial stressors including diagnosis of PSC, relationship issues and his father left the house unexpectedly last year.  Although reported fatigue and anhedonia may be multifactorial given his physical condition, he would be a good candidate for antidepressant to treat neurovegetative symptoms. Will switch from Celexa to sertraline to target depression given patient reports limited benefit from Celexa.  May consider adding low-dose of Wellbutrin to target significant fatigue in the future. Discussed behavioral activation. Explored his value of connectedness with others. He will greatly benefit from CBT; will make a referral.   # Marijuana use disorder He is at precontemplative stage for marijuana use. Will continue motivational interview.    Plan 1.  Decease citalopram 20 mg daily for one week, then discontinue 2. Start sertraline 25 mg at night for one week, then 50 mg at night for one week, then 100 mg at night 3. Return to clinic in six weeks for 30 mins 4. Referral to therapy (on xanax 0.25-0.5 mg daily prn for anxiety)  The patient demonstrates the following risk factors for suicide: Chronic risk factors for suicide include: psychiatric disorder of depression and substance use disorder. Acute risk factors for suicide include: family or marital conflict and unemployment. Protective factors for this patient include: positive social support, coping skills and hope for the future. Considering these factors, the overall suicide risk at this point appears to be low. Patient is appropriate for outpatient follow up. He denies gun access at home    Norman Clay, MD 04/01/2018, 3:23 PM

## 2018-04-02 ENCOUNTER — Ambulatory Visit: Payer: Self-pay | Admitting: Adult Health

## 2018-04-04 ENCOUNTER — Ambulatory Visit (HOSPITAL_COMMUNITY): Payer: Self-pay | Admitting: Psychiatry

## 2018-04-07 ENCOUNTER — Other Ambulatory Visit: Payer: Self-pay | Admitting: Internal Medicine

## 2018-04-10 ENCOUNTER — Other Ambulatory Visit: Payer: Self-pay

## 2018-04-10 ENCOUNTER — Emergency Department (HOSPITAL_COMMUNITY)
Admission: EM | Admit: 2018-04-10 | Discharge: 2018-04-10 | Disposition: A | Discharge status: Home / Self Care | Payer: Self-pay | Attending: Emergency Medicine | Admitting: Emergency Medicine

## 2018-04-10 ENCOUNTER — Encounter (HOSPITAL_COMMUNITY): Payer: Self-pay | Admitting: *Deleted

## 2018-04-10 DIAGNOSIS — Z79899 Other long term (current) drug therapy: Secondary | ICD-10-CM | POA: Insufficient documentation

## 2018-04-10 DIAGNOSIS — R1013 Epigastric pain: Secondary | ICD-10-CM | POA: Insufficient documentation

## 2018-04-10 DIAGNOSIS — F17200 Nicotine dependence, unspecified, uncomplicated: Secondary | ICD-10-CM | POA: Insufficient documentation

## 2018-04-10 DIAGNOSIS — E039 Hypothyroidism, unspecified: Secondary | ICD-10-CM | POA: Insufficient documentation

## 2018-04-10 HISTORY — DX: Gastro-esophageal reflux disease without esophagitis: K21.9

## 2018-04-10 LAB — COMPREHENSIVE METABOLIC PANEL
ALT: 17 U/L (ref 0–44)
ANION GAP: 8 (ref 5–15)
AST: 18 U/L (ref 15–41)
Albumin: 4.1 g/dL (ref 3.5–5.0)
Alkaline Phosphatase: 100 U/L (ref 38–126)
BILIRUBIN TOTAL: 0.3 mg/dL (ref 0.3–1.2)
BUN: 11 mg/dL (ref 6–20)
CHLORIDE: 104 mmol/L (ref 98–111)
CO2: 30 mmol/L (ref 22–32)
Calcium: 9.6 mg/dL (ref 8.9–10.3)
Creatinine, Ser: 1.13 mg/dL (ref 0.61–1.24)
Glucose, Bld: 89 mg/dL (ref 70–99)
POTASSIUM: 3.9 mmol/L (ref 3.5–5.1)
Sodium: 142 mmol/L (ref 135–145)
TOTAL PROTEIN: 8.1 g/dL (ref 6.5–8.1)

## 2018-04-10 LAB — CBC WITH DIFFERENTIAL/PLATELET
BASOS ABS: 0 10*3/uL (ref 0.0–0.1)
BASOS PCT: 0 %
Eosinophils Absolute: 0.1 10*3/uL (ref 0.0–0.7)
Eosinophils Relative: 1 %
HEMATOCRIT: 39.9 % (ref 39.0–52.0)
Hemoglobin: 13 g/dL (ref 13.0–17.0)
Lymphocytes Relative: 16 %
Lymphs Abs: 1 10*3/uL (ref 0.7–4.0)
MCH: 30 pg (ref 26.0–34.0)
MCHC: 32.6 g/dL (ref 30.0–36.0)
MCV: 92.1 fL (ref 78.0–100.0)
Monocytes Absolute: 0.6 10*3/uL (ref 0.1–1.0)
Monocytes Relative: 10 %
NEUTROS ABS: 4.3 10*3/uL (ref 1.7–7.7)
NEUTROS PCT: 73 %
Platelets: 154 10*3/uL (ref 150–400)
RBC: 4.33 MIL/uL (ref 4.22–5.81)
RDW: 13.7 % (ref 11.5–15.5)
WBC: 5.9 10*3/uL (ref 4.0–10.5)

## 2018-04-10 LAB — URINALYSIS, ROUTINE W REFLEX MICROSCOPIC
BACTERIA UA: NONE SEEN
Bilirubin Urine: NEGATIVE
Glucose, UA: NEGATIVE mg/dL
Ketones, ur: 5 mg/dL — AB
Leukocytes, UA: NEGATIVE
Nitrite: NEGATIVE
Protein, ur: NEGATIVE mg/dL
SPECIFIC GRAVITY, URINE: 1.033 — AB (ref 1.005–1.030)
pH: 5 (ref 5.0–8.0)

## 2018-04-10 LAB — LIPASE, BLOOD: LIPASE: 31 U/L (ref 11–51)

## 2018-04-10 MED ORDER — SUCRALFATE 1 GM/10ML PO SUSP: 1.0000 g | Freq: Three times a day (TID) | ORAL | 0 refills | Status: DC

## 2018-04-10 MED ORDER — GI COCKTAIL ~~LOC~~
30.0000 mL | Freq: Once | ORAL | Status: AC
  Administered 2018-04-10: 30 mL via ORAL
  Filled 2018-04-10: qty 30

## 2018-04-10 MED ORDER — FAMOTIDINE 20 MG PO TABS: 20.0000 mg | ORAL_TABLET | Freq: Two times a day (BID) | ORAL | 0 refills | Status: DC

## 2018-04-10 NOTE — Discharge Instructions (Addendum)
1.  Continue your daily omeprazole. 2.  Start Pepcid twice daily for the next 2 weeks.  Take Carafate as prescribed 3 times a day for the next week. 3.  Follow reflux diet. 4.  Schedule a recheck with your family doctor and gastroenterologist as soon as possible.

## 2018-04-10 NOTE — ED Provider Notes (Signed)
[facility] DEPT Provider Note   CSN: [id number] Arrival date & time: 04/10/18  0849     History   Chief Complaint Chief Complaint  Patient presents with  . Abdominal Pain    HPI Miron Marxen is a 27 y.o. male.  HPI Patient has been seen at [facility] gastroenterology for chronic elevation in ALT and bilirubin.  He has had liver imaging including MRI.  For the past week he has been getting epigastric discomfort.  It is often associated with eating.  He reports typically his symptoms are controlled with omeprazole which he reports he is been taking.  He has had occasional vomiting but not recurrent.  No fever, no blood in emesis.  Reports once he had some discomfort with urination during the week but not since. Past Medical History:  Diagnosis Date  . GERD (gastroesophageal reflux disease)   . Graves disease 08/18/2015  . Hyperlipidemia   . Hypothyroid   . Palpitations   . Vitamin D deficiency     Patient Active Problem List   Diagnosis Date Noted  . MDD (major depressive disorder), recurrent severe, without psychosis ([place]) 02/26/2018  . Non-compliance 01/14/2018  . No-show for appointment 01/14/2018  . Current severe episode of major depressive disorder without psychotic features ([place]) 11/06/2017  . Transaminitis 04/08/2017  . Abnormal LFTs 04/08/2017  . Drug addiction / Marajuana ([place]) 01/06/2017  . Hyperlipidemia, mixed 01/11/2016  . Medication management 01/11/2016  . Palpitations 12/04/2015  . Hypothyroidism 12/04/2015  . Graves disease 08/18/2015  . Vitamin D deficiency 08/11/2015    Past Surgical History:  Procedure Laterality Date  . ANTERIOR CRUCIATE LIGAMENT REPAIR  2012  . WISDOM TOOTH EXTRACTION          Home Medications    Prior to Admission medications   Medication Sig Start Date End Date Taking? Authorizing Provider  bisoprolol-hydrochlorothiazide (ZIAC) 10-6.25 MG tablet Take 1 tablet by mouth daily. 01/22/18  Yes  Liane Comber, NP  Cholecalciferol (VITAMIN D3) 5000 units CAPS Take 10,000 Units by mouth daily.   Yes [provider]  ezetimibe (ZETIA) 10 MG tablet Take 1 tablet daily for Cholesterol 02/18/18  Yes [name], MD  hydrOXYzine (ATARAX/VISTARIL) 25 MG tablet Take 1 tablet (25 mg total) by mouth 3 (three) times daily as needed for anxiety. 03/01/18  Yes Starkes, Gayland Curry, FNP  levothyroxine (SYNTHROID, LEVOTHROID) 75 MCG tablet take 1 tablet by mouth every morning ON AN EMPTY STOMACH 02/08/18  Yes [name], MD  omeprazole (PRILOSEC) 40 MG capsule Take 1 capsule (40 mg total) by mouth daily. Please schedule an Office visit for future refills: [phone] 04/01/18  Yes Armbruster, Carlota Raspberry, MD  sertraline (ZOLOFT) 100 MG tablet Take 1 tablet (100 mg total) by mouth at bedtime. 04/01/18  Yes Norman Clay, MD  famotidine (PEPCID) 20 MG tablet Take 1 tablet (20 mg total) by mouth 2 (two) times daily. 04/10/18   Charlesetta Shanks, MD  ranitidine (ZANTAC) 300 MG tablet Take 1 tablet as needed for heartburn.  Do not exceed 2 tablets in 24 hours. Patient not taking: Reported on 04/10/2018 04/12/16 02/26/18  Forcucci, Loma Sousa, PA-C  sucralfate (CARAFATE) 1 GM/10ML suspension Take 10 mLs (1 g total) by mouth 4 (four) times daily -  with meals and at bedtime. 04/10/18   Charlesetta Shanks, MD  traZODone (DESYREL) 50 MG tablet Take 1 tablet (50 mg total) by mouth at bedtime as needed for sleep. 03/01/18   Nanci Pina, FNP  Family History Family History  Problem Relation Age of Onset  . Diabetes Mother   . Colon cancer Maternal Grandmother   . Stomach cancer Maternal Grandmother   . Diabetes Maternal Grandmother   . Stomach cancer Maternal Grandfather   . Diabetes Maternal Grandfather     Social History Social History   Tobacco Use  . Smoking status: Current Some Day Smoker  . Smokeless tobacco: Never Used  . Tobacco comment: Marijuana  Substance Use Topics  . Alcohol use: No  .  Drug use: Yes    Types: Marijuana     Allergies   Tylenol [acetaminophen]   Review of Systems Review of Systems 10 Systems reviewed and are negative for acute change except as noted in the HPI.   Physical Exam Updated Vital Signs BP (!) 144/68 (BP Location: Right Arm)   Temp 98 F (36.7 C) (Oral)   Resp 16   Ht 6\' 3"  (1.905 m)   Wt 104.3 kg (230 lb)   SpO2 98%   BMI 28.75 kg/m   Physical Exam  Constitutional: He is oriented to person, place, and time. He appears well-developed and well-nourished.  HENT:  Head: Normocephalic and atraumatic.  Mouth/Throat: Oropharynx is clear and moist.  Eyes: EOM are normal.  Neck: Neck supple.  Cardiovascular: Normal rate, regular rhythm, normal heart sounds and intact distal pulses.  Pulmonary/Chest: Effort normal and breath sounds normal.  Abdominal: Soft. Bowel sounds are normal. He exhibits no distension. There is no tenderness.  Musculoskeletal: Normal range of motion. He exhibits no edema.  Neurological: He is alert and oriented to person, place, and time. He has normal strength. Coordination normal. GCS eye subscore is 4. GCS verbal subscore is 5. GCS motor subscore is 6.  Skin: Skin is warm, dry and intact.  Psychiatric: He has a normal mood and affect.     ED Treatments / Results  Labs (all labs ordered are listed, but only abnormal results are displayed) Labs Reviewed  URINALYSIS, ROUTINE W REFLEX MICROSCOPIC - Abnormal; Notable for the following components:      Result Value   Color, Urine AMBER (*)    Specific Gravity, Urine 1.033 (*)    Hgb urine dipstick SMALL (*)    Ketones, ur 5 (*)    All other components within normal limits  COMPREHENSIVE METABOLIC PANEL  LIPASE, BLOOD  CBC WITH DIFFERENTIAL/PLATELET    EKG None  Radiology No results found.  Procedures Procedures (including critical care time)  Medications Ordered in ED Medications  gi cocktail (Maalox,Lidocaine,Donnatal) (30 mLs Oral Given  04/10/18 8676)     Initial Impression / Assessment and Plan / ED Course  I have reviewed the triage vital signs and the nursing notes.  Pertinent labs & imaging results that were available during my care of the patient were reviewed by me and considered in my medical decision making (see chart for details).      Final Clinical Impressions(s) / ED Diagnoses   Final diagnoses:  Epigastric pain   Patient has history of an unspecified liver disorder.  He has seen gastroenterology for this in the past.  Today there are no anomalies in his LFTs or other labs.  Patient's abdomen is soft and really does not have any reproducible pain today.  He describes symptoms as often being food associated.  He does have known history of GERD.  At this time, plan will be to increase his treatment for gastritis and GERD with daily Carafate and Pepcid for  a week in addition to omeprazole.  He is advised he must schedule follow-up with GI.  Patient is also advised to discontinue NSAIDs at this time. ED Discharge Orders        Ordered    famotidine (PEPCID) 20 MG tablet  2 times daily     04/10/18 1114    sucralfate (CARAFATE) 1 GM/10ML suspension  3 times daily with meals & bedtime     04/10/18 1114       Charlesetta Shanks, MD 04/10/18 1118

## 2018-04-10 NOTE — ED Triage Notes (Signed)
Abd pain in mid abd, vague description of symptoms, few days ago some vomiting, this morning one loose BM. Pt has history of GERD

## 2018-04-15 DIAGNOSIS — I10 Essential (primary) hypertension: Secondary | ICD-10-CM | POA: Insufficient documentation

## 2018-04-15 NOTE — Progress Notes (Signed)
Assessment and Plan:  Kyle Gonzales was seen today for acute visit.  Diagnoses and all orders for this visit:  Paronychia of left middle finger Advised soak with epsom 4 times daily, avoid pulling skin, biting, etc to avoid recurrence Information provided on AVS Contact office if not improving Reevaluate at follow up as scheduled      -     doxycycline (VIBRAMYCIN) 100 MG capsule; Take 1 capsule twice daily with food  Hypertension, unspecified type Much improved from last visit; continue ziac Monitor blood pressure at home; call if consistently over 130/80 Continue DASH diet.   Reminder to go to the ER if any CP, SOB, nausea, dizziness, severe HA, changes vision/speech, left arm numbness and tingling and jaw pain.  Further disposition pending results of labs. Discussed med's effects and SE's.   Over 15 minutes of exam, counseling, chart review, and critical decision making was performed.   Future Appointments  Date Time Provider [place]  05/29/2018  3:00 PM Alonza Smoker, LCSW [facility] None  06/04/2018  2:30 PM Liane Comber, NP [facility] None  09/04/2018  2:30 PM [name], MD [facility] None  03/11/2019  3:00 PM [name], MD [facility] None    ------------------------------------------------------------------------------------------------------------------   HPI BP 122/66   Pulse 68   Temp 97.7 F (36.5 C)   Resp 16   Ht 6\' 3"  (1.905 m)   Wt 234 lb (106.1 kg)   SpO2 99%   BMI 29.25 kg/m   26 y.o.male presents for evaluation of finger; presents with swelling/injection of skin surrounding nail of left 3rd digit, reportedly ongoing intermittently for 2 months or so. He reports he has been applying ice which initially helped and nearly resolved. He does report he has been pressing on area and has expressed some purulent material.   He is newly on ziac 10-6.25 for htn; he reports he is taking medication daily but has not been taking BP at home, today their  BP is BP: 122/66  He does workout. He denies chest pain, shortness of breath, dizziness.   Past Medical History:  Diagnosis Date  . GERD (gastroesophageal reflux disease)   . Graves disease 08/18/2015  . Hyperlipidemia   . Hypothyroid   . Palpitations   . Vitamin D deficiency      Allergies  Allergen Reactions  . Tylenol [Acetaminophen]     r/t elevated LFTs    Current Outpatient Medications on File Prior to Visit  Medication Sig  . bisoprolol-hydrochlorothiazide (ZIAC) 10-6.25 MG tablet Take 1 tablet by mouth daily.  . Cholecalciferol (VITAMIN D3) 5000 units CAPS Take 10,000 Units by mouth daily.  Marland Kitchen ezetimibe (ZETIA) 10 MG tablet Take 1 tablet daily for Cholesterol  . famotidine (PEPCID) 20 MG tablet Take 1 tablet (20 mg total) by mouth 2 (two) times daily.  . hydrOXYzine (ATARAX/VISTARIL) 25 MG tablet Take 1 tablet (25 mg total) by mouth 3 (three) times daily as needed for anxiety.  Marland Kitchen levothyroxine (SYNTHROID, LEVOTHROID) 75 MCG tablet take 1 tablet by mouth every morning ON AN EMPTY STOMACH  . omeprazole (PRILOSEC) 40 MG capsule Take 1 capsule (40 mg total) by mouth daily. Please schedule an Office visit for future refills: [phone]  . sertraline (ZOLOFT) 100 MG tablet Take 1 tablet (100 mg total) by mouth at bedtime.  . sucralfate (CARAFATE) 1 GM/10ML suspension Take 10 mLs (1 g total) by mouth 4 (four) times daily -  with meals and at bedtime.  . traZODone (DESYREL) 50 MG tablet  Take 1 tablet (50 mg total) by mouth at bedtime as needed for sleep.  . ranitidine (ZANTAC) 300 MG tablet Take 1 tablet as needed for heartburn.  Do not exceed 2 tablets in 24 hours. (Patient not taking: Reported on 04/10/2018)   No current facility-administered medications on file prior to visit.     ROS: all negative except above.   Physical Exam:  BP 122/66   Pulse 68   Temp 97.7 F (36.5 C)   Resp 16   Ht 6\' 3"  (1.905 m)   Wt 234 lb (106.1 kg)   SpO2 99%   BMI 29.25 kg/m    General Appearance: Well nourished, in no apparent distress. Neck: Supple Respiratory: Respiratory effort normal, BS equal bilaterally without rales, rhonchi, wheezing or stridor.  Cardio: RRR with no MRGs. Brisk peripheral pulses without edema.  Lymphatics: Non tender without lymphadenopathy.  Musculoskeletal: Full ROM, normal gait.  Skin: Warm, dry; presents with inflammation/erythema of skin surrounding left 3rd digit nail without fluctuance; unable to express purulent material today; no apparent subungal abscess   Psych: Awake and oriented X 3, normal affect, Insight and Judgment appropriate.     Izora Ribas, NP 4:41 PM [facility]

## 2018-04-16 ENCOUNTER — Encounter: Payer: Self-pay | Admitting: Adult Health

## 2018-04-16 ENCOUNTER — Ambulatory Visit: Payer: 59 | Admitting: Adult Health

## 2018-04-16 VITALS — BP 122/66 | HR 68 | Temp 97.7°F | Resp 16 | Ht 75.0 in | Wt 234.0 lb

## 2018-04-16 DIAGNOSIS — L03012 Cellulitis of left finger: Secondary | ICD-10-CM

## 2018-04-16 DIAGNOSIS — I10 Essential (primary) hypertension: Secondary | ICD-10-CM

## 2018-04-16 MED ORDER — DOXYCYCLINE HYCLATE 100 MG PO CAPS: ORAL_CAPSULE | ORAL | 0 refills | Status: DC

## 2018-04-16 NOTE — Patient Instructions (Signed)
Paronychia Paronychia is an infection of the skin that surrounds a nail. It usually affects the skin around a fingernail, but it may also occur near a toenail. It often causes pain and swelling around the nail. This condition may come on suddenly or develop over a longer period. In some cases, a collection of pus (abscess) can form near or under the nail. Usually, paronychia is not serious and it clears up with treatment. What are the causes? This condition may be caused by bacteria or fungi. It is commonly caused by either Streptococcus or Staphylococcus bacteria. The bacteria or fungi often cause the infection by getting into the affected area through an opening in the skin, such as a cut or a hangnail. What increases the risk? This condition is more likely to develop in:  People who get their hands wet often, such as those who work as Designer, industrial/product, bartenders, or nurses.  People who bite their fingernails or suck their thumbs.  People who trim their nails too short.  People who have hangnails or injured fingertips.  People who get manicures.  People who have diabetes.  What are the signs or symptoms? Symptoms of this condition include:  Redness and swelling of the skin near the nail.  Tenderness around the nail when you touch the area.  Pus-filled bumps under the cuticle. The cuticle is the skin at the base or sides of the nail.  Fluid or pus under the nail.  Throbbing pain in the area.  How is this diagnosed? This condition is usually diagnosed with a physical exam. In some cases, a sample of pus may be taken from an abscess to be tested in a lab. This can help to determine what type of bacteria or fungi is causing the condition. How is this treated? Treatment for this condition depends on the cause and severity of the condition. If the condition is mild, it may clear up on its own in a few days. Your health care provider may recommend soaking the affected area in warm water a  few times a day. When treatment is needed, the options may include:  Antibiotic medicine, if the condition is caused by a bacterial infection.  Antifungal medicine, if the condition is caused by a fungal infection.  Incision and drainage, if an abscess is present. In this procedure, the health care provider will cut open the abscess so the pus can drain out.  Follow these instructions at home:  Soak the affected area in warm water if directed to do so by your health care provider. You may be told to do this for 20 minutes, 2-3 times a day. Keep the area dry in between soakings.  Take medicines only as directed by your health care provider.  If you were prescribed an antibiotic medicine, finish all of it even if you start to feel better.  Keep the affected area clean.  Do not try to drain a fluid-filled bump yourself.  If you will be washing dishes or performing other tasks that require your hands to get wet, wear rubber gloves. You should also wear gloves if your hands might come in contact with irritating substances, such as cleaners or chemicals.  Follow your health care provider's instructions about: ? Wound care. ? Bandage (dressing) changes and removal. Contact a health care provider if:  Your symptoms get worse or do not improve with treatment.  You have a fever or chills.  You have redness spreading from the affected area.  You have continued  or increased fluid, blood, or pus coming from the affected area.  Your finger or knuckle becomes swollen or is difficult to move. This information is not intended to replace advice given to you by your health care provider. Make sure you discuss any questions you have with your health care provider. Document Released: 03/28/2001 Document Revised: 03/09/2016 Document Reviewed: 09/09/2014 Elsevier Interactive Patient Education  2018 [employer].    Doxycycline tablets or capsules What is this medicine? DOXYCYCLINE (dox i SYE  kleen) is a tetracycline antibiotic. It kills certain bacteria or stops their growth. It is used to treat many kinds of infections, like dental, skin, respiratory, and urinary tract infections. It also treats acne, Lyme disease, malaria, and certain sexually transmitted infections. This medicine may be used for other purposes; ask your health care provider or pharmacist if you have questions. COMMON BRAND NAME(S): Acticlate, Adoxa, Adoxa CK, Adoxa Pak, Adoxa TT, Alodox, Avidoxy, Doxal, Mondoxyne NL, Monodox, Morgidox 1x, Morgidox 1x Kit, Morgidox 2x, Morgidox 2x Kit, NutriDox, Ocudox, TARGADOX, Vibra-Tabs, Vibramycin What should I tell my health care provider before I take this medicine? They need to know if you have any of these conditions: -liver disease -long exposure to sunlight like working outdoors -stomach problems like colitis -an unusual or allergic reaction to doxycycline, tetracycline antibiotics, other medicines, foods, dyes, or preservatives -pregnant or trying to get pregnant -breast-feeding How should I use this medicine? Take this medicine by mouth with a full glass of water. Follow the directions on the prescription label. It is best to take this medicine without food, but if it upsets your stomach take it with food. Take your medicine at regular intervals. Do not take your medicine more often than directed. Take all of your medicine as directed even if you think you are better. Do not skip doses or stop your medicine early. Talk to your pediatrician regarding the use of this medicine in children. While this drug may be prescribed for selected conditions, precautions do apply. Overdosage: If you think you have taken too much of this medicine contact a poison control center or emergency room at once. NOTE: This medicine is only for you. Do not share this medicine with others. What if I miss a dose? If you miss a dose, take it as soon as you can. If it is almost time for your next  dose, take only that dose. Do not take double or extra doses. What may interact with this medicine? -antacids -barbiturates -birth control pills -bismuth subsalicylate -carbamazepine -methoxyflurane -other antibiotics -phenytoin -vitamins that contain iron -warfarin This list may not describe all possible interactions. Give your health care provider a list of all the medicines, herbs, non-prescription drugs, or dietary supplements you use. Also tell them if you smoke, drink alcohol, or use illegal drugs. Some items may interact with your medicine. What should I watch for while using this medicine? Tell your doctor or health care professional if your symptoms do not improve. Do not treat diarrhea with over the counter products. Contact your doctor if you have diarrhea that lasts more than 2 days or if it is severe and watery. Do not take this medicine just before going to bed. It may not dissolve properly when you lay down and can cause pain in your throat. Drink plenty of fluids while taking this medicine to also help reduce irritation in your throat. This medicine can make you more sensitive to the sun. Keep out of the sun. If you cannot avoid being in  the sun, wear protective clothing and use sunscreen. Do not use sun lamps or tanning beds/booths. Birth control pills may not work properly while you are taking this medicine. Talk to your doctor about using an extra method of birth control. If you are being treated for a sexually transmitted infection, avoid sexual contact until you have finished your treatment. Your sexual partner may also need treatment. Avoid antacids, aluminum, calcium, magnesium, and iron products for 4 hours before and 2 hours after taking a dose of this medicine. If you are using this medicine to prevent malaria, you should still protect yourself from contact with mosquitos. Stay in screened-in areas, use mosquito nets, keep your body covered, and use an insect  repellent. What side effects may I notice from receiving this medicine? Side effects that you should report to your doctor or health care professional as soon as possible: -allergic reactions like skin rash, itching or hives, swelling of the face, lips, or tongue -difficulty breathing -fever -itching in the rectal or genital area -pain on swallowing -redness, blistering, peeling or loosening of the skin, including inside the mouth -severe stomach pain or cramps -unusual bleeding or bruising -unusually weak or tired -yellowing of the eyes or skin Side effects that usually do not require medical attention (report to your doctor or health care professional if they continue or are bothersome): -diarrhea -loss of appetite -nausea, vomiting This list may not describe all possible side effects. Call your doctor for medical advice about side effects. You may report side effects to FDA at 1-800-FDA-1088. Where should I keep my medicine? Keep out of the reach of children. Store at room temperature, below 30 degrees C (86 degrees F). Protect from light. Keep container tightly closed. Throw away any unused medicine after the expiration date. Taking this medicine after the expiration date can make you seriously ill. NOTE: This sheet is a summary. It may not cover all possible information. If you have questions about this medicine, talk to your doctor, pharmacist, or health care provider.  2018 Elsevier/Gold Standard (2015-11-03 17:11:22)

## 2018-04-24 ENCOUNTER — Emergency Department (HOSPITAL_COMMUNITY)
Admission: EM | Admit: 2018-04-24 | Discharge: 2018-04-25 | Disposition: A | Discharge status: Home / Self Care | Payer: 59 | Source: Home / Self Care | Attending: Emergency Medicine | Admitting: Emergency Medicine

## 2018-04-24 ENCOUNTER — Encounter (HOSPITAL_COMMUNITY): Payer: Self-pay | Admitting: Emergency Medicine

## 2018-04-24 ENCOUNTER — Emergency Department (HOSPITAL_COMMUNITY): Payer: 59

## 2018-04-24 ENCOUNTER — Other Ambulatory Visit: Payer: Self-pay

## 2018-04-24 DIAGNOSIS — Z79899 Other long term (current) drug therapy: Secondary | ICD-10-CM

## 2018-04-24 DIAGNOSIS — K8301 Primary sclerosing cholangitis: Secondary | ICD-10-CM | POA: Insufficient documentation

## 2018-04-24 DIAGNOSIS — E039 Hypothyroidism, unspecified: Secondary | ICD-10-CM

## 2018-04-24 DIAGNOSIS — R1013 Epigastric pain: Secondary | ICD-10-CM

## 2018-04-24 DIAGNOSIS — F17210 Nicotine dependence, cigarettes, uncomplicated: Secondary | ICD-10-CM

## 2018-04-24 DIAGNOSIS — I81 Portal vein thrombosis: Secondary | ICD-10-CM | POA: Diagnosis not present

## 2018-04-24 DIAGNOSIS — R109 Unspecified abdominal pain: Secondary | ICD-10-CM | POA: Diagnosis not present

## 2018-04-24 LAB — CBC
HCT: 42.6 % (ref 39.0–52.0)
Hemoglobin: 14.7 g/dL (ref 13.0–17.0)
MCH: 30.2 pg (ref 26.0–34.0)
MCHC: 34.5 g/dL (ref 30.0–36.0)
MCV: 87.5 fL (ref 78.0–100.0)
PLATELETS: 202 10*3/uL (ref 150–400)
RBC: 4.87 MIL/uL (ref 4.22–5.81)
RDW: 13.8 % (ref 11.5–15.5)
WBC: 13.4 10*3/uL — ABNORMAL HIGH (ref 4.0–10.5)

## 2018-04-24 LAB — LIPASE, BLOOD: Lipase: 25 U/L (ref 11–51)

## 2018-04-24 LAB — COMPREHENSIVE METABOLIC PANEL
ALT: 17 U/L (ref 0–44)
ANION GAP: 17 — AB (ref 5–15)
AST: 22 U/L (ref 15–41)
Albumin: 4.9 g/dL (ref 3.5–5.0)
Alkaline Phosphatase: 80 U/L (ref 38–126)
BUN: 14 mg/dL (ref 6–20)
CALCIUM: 9.9 mg/dL (ref 8.9–10.3)
CO2: 20 mmol/L — AB (ref 22–32)
Chloride: 102 mmol/L (ref 98–111)
Creatinine, Ser: 1.16 mg/dL (ref 0.61–1.24)
GFR calc Af Amer: >60 mL/min (ref >60)
GLUCOSE: 138 mg/dL — AB (ref 70–99)
Potassium: 3.2 mmol/L — ABNORMAL LOW (ref 3.5–5.1)
Sodium: 139 mmol/L (ref 135–145)
TOTAL PROTEIN: 8.7 g/dL — AB (ref 6.5–8.1)
Total Bilirubin: 1.1 mg/dL (ref 0.3–1.2)

## 2018-04-24 LAB — URINALYSIS, ROUTINE W REFLEX MICROSCOPIC
Bacteria, UA: NONE SEEN
Bilirubin Urine: NEGATIVE
GLUCOSE, UA: NEGATIVE mg/dL
Ketones, ur: 20 mg/dL — AB
Leukocytes, UA: NEGATIVE
NITRITE: NEGATIVE
PH: 6 (ref 5.0–8.0)
PROTEIN: NEGATIVE mg/dL
SPECIFIC GRAVITY, URINE: 1.023 (ref 1.005–1.030)

## 2018-04-24 LAB — PROTIME-INR
INR: 1.14
Prothrombin Time: 14.5 seconds (ref 11.4–15.2)

## 2018-04-24 MED ORDER — GI COCKTAIL ~~LOC~~
30.0000 mL | Freq: Once | ORAL | Status: AC
  Administered 2018-04-24: 30 mL via ORAL
  Filled 2018-04-24: qty 30

## 2018-04-24 MED ORDER — FAMOTIDINE IN NACL 20-0.9 MG/50ML-% IV SOLN
20.0000 mg | Freq: Two times a day (BID) | INTRAVENOUS | Status: DC
  Administered 2018-04-24: 20 mg via INTRAVENOUS
  Filled 2018-04-24: qty 50

## 2018-04-24 MED ORDER — ONDANSETRON HCL 4 MG/2ML IJ SOLN
4.0000 mg | Freq: Once | INTRAMUSCULAR | Status: AC
  Administered 2018-04-24: 4 mg via INTRAVENOUS
  Filled 2018-04-24: qty 2

## 2018-04-24 MED ORDER — HYDROMORPHONE HCL 1 MG/ML IJ SOLN
1.0000 mg | Freq: Once | INTRAMUSCULAR | Status: AC
  Administered 2018-04-24: 1 mg via INTRAVENOUS
  Filled 2018-04-24: qty 1

## 2018-04-24 MED ORDER — SODIUM CHLORIDE 0.9 % IV BOLUS
1000.0000 mL | Freq: Once | INTRAVENOUS | Status: AC
  Administered 2018-04-24: 1000 mL via INTRAVENOUS

## 2018-04-24 NOTE — ED Triage Notes (Signed)
Pt from home with c/o central upper abdomial pain that began this morning. Pt reports 3 episodes of emesis but denies diarrhea. 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

## 2018-04-24 NOTE — ED Provider Notes (Signed)
[facility] DEPT Provider Note   CSN: [id number] Arrival date & time: 04/24/18  2035     History   Chief Complaint Chief Complaint  Patient presents with  . Abdominal Pain    HPI Kyle Gonzales is a 27 y.o. male.   27 y/o male with hx of graves disease, HLD, primary sclerosing cholangitis, GERD presents to the ED for complaints of epigastric pain. Pain began this afternoon and has been intermittent and waxing and waning in severity; reports it has become more constant recently. The pain does not radiate and patient denies any known modifying factors of his symptoms. No medications taken PTA. Symptoms associated with nausea and NB/NB vomiting x 4. No medications taken PTA and the patient has not had any associated fevers, bowel changes, urinary symptoms. Followed by GI every 6 months for [facility]; last MRCP was in February 2019.  The history is provided by the patient. No language interpreter was used.  Abdominal Pain      Past Medical History:  Diagnosis Date  . GERD (gastroesophageal reflux disease)   . Graves disease 08/18/2015  . Hyperlipidemia   . Hypothyroid   . Palpitations   . Vitamin D deficiency     Patient Active Problem List   Diagnosis Date Noted  . Hypertension 04/15/2018  . MDD (major depressive disorder), recurrent severe, without psychosis ([place]) 02/26/2018  . Non-compliance 01/14/2018  . No-show for appointment 01/14/2018  . Current severe episode of major depressive disorder without psychotic features ([place]) 11/06/2017  . Transaminitis 04/08/2017  . Abnormal LFTs 04/08/2017  . Drug addiction / Marajuana ([place]) 01/06/2017  . Hyperlipidemia, mixed 01/11/2016  . Medication management 01/11/2016  . Palpitations 12/04/2015  . Hypothyroidism 12/04/2015  . Graves disease 08/18/2015  . Vitamin D deficiency 08/11/2015    Past Surgical History:  Procedure Laterality Date  . ANTERIOR CRUCIATE LIGAMENT REPAIR  2012  . WISDOM TOOTH  EXTRACTION          Home Medications    Prior to Admission medications   Medication Sig Start Date End Date Taking? Authorizing Provider  bisoprolol-hydrochlorothiazide (ZIAC) 10-6.25 MG tablet Take 1 tablet by mouth daily. 01/22/18  Yes Liane Comber, NP  Cholecalciferol (VITAMIN D3) 5000 units CAPS Take 10,000 Units by mouth daily.   Yes [provider]  doxycycline (VIBRAMYCIN) 100 MG capsule Take 1 capsule twice daily with food 04/16/18  Yes Liane Comber, NP  ezetimibe (ZETIA) 10 MG tablet Take 1 tablet daily for Cholesterol 02/18/18  Yes [name], MD  famotidine (PEPCID) 20 MG tablet Take 1 tablet (20 mg total) by mouth 2 (two) times daily. 04/10/18  Yes Charlesetta Shanks, MD  hydrOXYzine (ATARAX/VISTARIL) 25 MG tablet Take 1 tablet (25 mg total) by mouth 3 (three) times daily as needed for anxiety. 03/01/18  Yes Starkes, Gayland Curry, FNP  levothyroxine (SYNTHROID, LEVOTHROID) 75 MCG tablet take 1 tablet by mouth every morning ON AN EMPTY STOMACH 02/08/18  Yes [name], MD  omeprazole (PRILOSEC) 40 MG capsule Take 1 capsule (40 mg total) by mouth daily. Please schedule an Office visit for future refills: [phone] 04/01/18  Yes Armbruster, Carlota Raspberry, MD  sertraline (ZOLOFT) 100 MG tablet Take 1 tablet (100 mg total) by mouth at bedtime. 04/01/18  Yes Norman Clay, MD  traZODone (DESYREL) 50 MG tablet Take 1 tablet (50 mg total) by mouth at bedtime as needed for sleep. 03/01/18  Yes Starkes, Gayland Curry, FNP  ondansetron (ZOFRAN) 4 MG tablet Take 1 tablet (  4 mg total) by mouth every 8 (eight) hours as needed for nausea or vomiting. 04/25/18   Antonietta Breach, PA-C  ranitidine (ZANTAC) 300 MG tablet Take 1 tablet as needed for heartburn.  Do not exceed 2 tablets in 24 hours. Patient not taking: Reported on 04/10/2018 04/12/16 02/26/18  Forcucci, Loma Sousa, PA-C  sucralfate (CARAFATE) 1 g tablet Take 1 tablet (1 g total) by mouth 2 (two) times daily. 04/25/18   Antonietta Breach, PA-C     Family History Family History  Problem Relation Age of Onset  . Diabetes Mother   . Colon cancer Maternal Grandmother   . Stomach cancer Maternal Grandmother   . Diabetes Maternal Grandmother   . Stomach cancer Maternal Grandfather   . Diabetes Maternal Grandfather     Social History Social History   Tobacco Use  . Smoking status: Current Some Day Smoker  . Smokeless tobacco: Never Used  . Tobacco comment: Marijuana  Substance Use Topics  . Alcohol use: No  . Drug use: Yes    Types: Marijuana     Allergies   Tylenol [acetaminophen]   Review of Systems Review of Systems  Gastrointestinal: Positive for abdominal pain.  Ten systems reviewed and are negative for acute change, except as noted in the HPI.    Physical Exam Updated Vital Signs BP (!) 143/89   Pulse (!) 46   Resp 17   SpO2 97%   Physical Exam  Constitutional: He is oriented to person, place, and time. He appears well-developed and well-nourished. No distress.  Appears uncomfortable.   HENT:  Head: Normocephalic and atraumatic.  Eyes: Conjunctivae and EOM are normal. No scleral icterus.  Neck: Normal range of motion.  Cardiovascular: Normal rate, regular rhythm and intact distal pulses.  Pulmonary/Chest: Effort normal. No stridor. No respiratory distress. He has no wheezes. He has no rales.  Lungs CTAB. Respirations even and unlabored  Abdominal: He exhibits no mass. There is tenderness. There is no rebound.  TTP in the epigastrium and the RUQ. No palpable masses or rigidity; no guarding. Exam without peritoneal signs.  Musculoskeletal: Normal range of motion.  Neurological: He is alert and oriented to person, place, and time. He exhibits normal muscle tone. Coordination normal.  Moving all extremities spontaneously.  Skin: Skin is warm and dry. No rash noted. He is not diaphoretic. No erythema. No pallor.  Psychiatric: He has a normal mood and affect. His behavior is normal.  Nursing note and  vitals reviewed.    ED Treatments / Results  Labs (all labs ordered are listed, but only abnormal results are displayed) Labs Reviewed  COMPREHENSIVE METABOLIC PANEL - Abnormal; Notable for the following components:      Result Value   Potassium 3.2 (*)    CO2 20 (*)    Glucose, Bld 138 (*)    Total Protein 8.7 (*)    Anion gap 17 (*)    All other components within normal limits  CBC - Abnormal; Notable for the following components:   WBC 13.4 (*)    All other components within normal limits  URINALYSIS, ROUTINE W REFLEX MICROSCOPIC - Abnormal; Notable for the following components:   Hgb urine dipstick SMALL (*)    Ketones, ur 20 (*)    All other components within normal limits  LIPASE, BLOOD  PROTIME-INR    EKG None  Radiology US Abdomen Complete  Result Date: 04/25/2018 CLINICAL DATA:  Abdominal pain today. History of primary sclerosing cholangitis. EXAM: ABDOMEN ULTRASOUND COMPLETE COMPARISON:  MRI of the liver November 20, 2017. FINDINGS: Gallbladder: Layering echogenic gallbladder sludge without discrete gallstone. No pericholecystic fluid. No gallbladder wall thickening. No sonographic Murphy sign elicited. Common bile duct: Diameter: 4 mm. Liver: Echogenic hepatic lesions measuring to 2.7 cm previously characterized as hemangiomas. Mildly prominent intrahepatic biliary dilatation, unchanged. Within normal limits in parenchymal echogenicity. Portal vein is patent on color Doppler imaging with normal direction of blood flow towards the liver. 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 Portal vein is not discretely identified. IVC: No abnormality visualized. Pancreas: Visualized portion unremarkable. Spleen: Size and appearance within normal limits. Right Kidney: Length: 12 cm. Echogenicity within normal limits. No mass or hydronephrosis visualized. Left Kidney: Length: 12.1 cm. Echogenicity within normal limits. No mass or hydronephrosis visualized. Abdominal aorta: No aneurysm  visualized. Other findings: None. IMPRESSION: 1. Gallbladder sludge without sonographic findings of acute cholecystitis. 2. Chronic cavernous transformation of portal vein. 3. Chronic biliary duct dilatation consistent with known sclerosing cholangitis. Electronically Signed   By: Elon Alas M.D.   On: 04/25/2018 00:39    Procedures Procedures (including critical care time)  Medications Ordered in ED Medications  famotidine (PEPCID) IVPB 20 mg premix (0 mg Intravenous Stopped 04/24/18 2319)  sodium chloride 0.9 % bolus 1,000 mL (0 mLs Intravenous Stopped 04/25/18 0050)  ondansetron (ZOFRAN) injection 4 mg (4 mg Intravenous Given 04/24/18 2249)  HYDROmorphone (DILAUDID) injection 1 mg (1 mg Intravenous Given 04/24/18 2249)  gi cocktail (Maalox,Lidocaine,Donnatal) (30 mLs Oral Given 04/24/18 2321)  ketorolac (TORADOL) 30 MG/ML injection 30 mg (30 mg Intravenous Given 04/25/18 0241)  dicyclomine (BENTYL) injection 20 mg (20 mg Intramuscular Given 04/25/18 0359)    1:00 AM Patient reassessed.  Pain has significantly improved.  The patient is resting comfortably.  States he received more significant improvement following GI cocktail.  He notes this was previously prescribed to him, but he was unable to fill the prescription secondary to cost.  1:14 AM Ultrasound findings reviewed which show gallbladder sludge without findings of acute cholecystitis.  No increased LFTs or Murphy sign.  Other ultrasound findings are chronic and stable, consistent with history of sclerosing cholangitis.  2:01 AM Currently tolerating ginger ale. States pain has improved greatly.  3:30 AM Patient reports increased cramping prior to discharge. This did not improve with Toradol. Will give IM Bentyl for symptomatic management.   Initial Impression / Assessment and Plan / ED Course  I have reviewed the triage vital signs and the nursing notes.  Pertinent labs & imaging results that were available during my care of  the patient were reviewed by me and considered in my medical decision making (see chart for details).     27 year old male presents to the emergency department for waxing and waning abdominal pain.  This is primarily focused in his epigastrium; mildly into the right upper quadrant.  He has had associated nausea and vomiting.  Work-up today is been generally reassuring.  Patient does have a history of primary sclerosing cholangitis.  Ultrasound imaging today suggest this to be stable.  LFTs reassuring with normal lipase.  No electrolyte derangements.  He has a mild leukocytosis which is nonspecific.  Leukocytosis thought less likely to represent infection.  He specifically has no sonographic evidence of acute cholecystitis today.  No peritoneal signs on abdominal exam.  The patient has been managed symptomatically in the emergency department with improvement in his pain.  He was seen for similar pain at the end of June.  Given symptom chronicity, have discussed importance of  gastroenterology follow-up.  He was prescribed Carafate, but was unable to fill this prescription secondary to cost.  Will transition to Carafate tablets and have the patient continue his daily medicines.  Return precautions discussed and provided. Patient discharged in stable condition with no unaddressed concerns.   Final Clinical Impressions(s) / ED Diagnoses   Final diagnoses:  Epigastric pain    ED Discharge Orders        Ordered    ondansetron (ZOFRAN) 4 MG tablet  Every 8 hours PRN     04/25/18 0247    sucralfate (CARAFATE) 1 g tablet  2 times daily     04/25/18 0247       Antonietta Breach, PA-C 04/25/18 7949    Dorie Rank, MD 04/26/18 1032

## 2018-04-25 MED ORDER — SUCRALFATE 1 G PO TABS: 1.0000 g | ORAL_TABLET | Freq: Two times a day (BID) | ORAL | 1 refills | Status: DC

## 2018-04-25 MED ORDER — DICYCLOMINE HCL 10 MG/ML IM SOLN
20.0000 mg | Freq: Once | INTRAMUSCULAR | Status: AC
  Administered 2018-04-25: 20 mg via INTRAMUSCULAR
  Filled 2018-04-25: qty 2

## 2018-04-25 MED ORDER — ONDANSETRON HCL 4 MG PO TABS: 4.0000 mg | ORAL_TABLET | Freq: Three times a day (TID) | ORAL | 0 refills | Status: DC | PRN

## 2018-04-25 MED ORDER — KETOROLAC TROMETHAMINE 30 MG/ML IJ SOLN
30.0000 mg | Freq: Once | INTRAMUSCULAR | Status: AC
  Administered 2018-04-25: 30 mg via INTRAVENOUS
  Filled 2018-04-25: qty 1

## 2018-04-25 NOTE — Discharge Instructions (Signed)
Continue your daily medications.  You have been prescribed Carafate to take for symptom prevention.  Use this with Pepcid as previously advised.  You may take Zofran as needed for nausea or vomiting.  Avoid spicy foods, citrus fruits, coffee, chocolate.  Follow-up with your gastroenterologist.

## 2018-04-25 NOTE — ED Notes (Signed)
Pt stated he was feeling better and no longer wanted to wait. Pt refused dc vitals. Pt ambulatory and in no apparent distress. Pt denied pain at present time

## 2018-04-25 NOTE — ED Notes (Signed)
Patient provided with sprite and crackers

## 2018-04-25 NOTE — ED Notes (Signed)
Patient states he was fine until he arrived to the lobby when he began to have stomach cramping. Patient placed back in the system, waiting for IM bentyl orders and will reassess in 30-60 minutes.

## 2018-04-26 ENCOUNTER — Other Ambulatory Visit: Payer: Self-pay | Admitting: Internal Medicine

## 2018-04-26 ENCOUNTER — Emergency Department (HOSPITAL_COMMUNITY): Payer: 59

## 2018-04-26 ENCOUNTER — Encounter (HOSPITAL_COMMUNITY): Payer: Self-pay | Admitting: Emergency Medicine

## 2018-04-26 ENCOUNTER — Inpatient Hospital Stay (HOSPITAL_COMMUNITY)
Admission: EM | Admit: 2018-04-26 | Discharge: 2018-05-02 | DRG: 442 | Disposition: A | Discharge status: Home / Self Care | Payer: 59 | Attending: Internal Medicine | Admitting: Internal Medicine

## 2018-04-26 ENCOUNTER — Ambulatory Visit: Payer: 59 | Admitting: Internal Medicine

## 2018-04-26 VITALS — BP 132/84 | HR 80 | Temp 97.3°F | Resp 16 | Ht 75.0 in | Wt 220.6 lb

## 2018-04-26 DIAGNOSIS — F339 Major depressive disorder, recurrent, unspecified: Secondary | ICD-10-CM | POA: Diagnosis present

## 2018-04-26 DIAGNOSIS — K8301 Primary sclerosing cholangitis: Secondary | ICD-10-CM

## 2018-04-26 DIAGNOSIS — K210 Gastro-esophageal reflux disease with esophagitis: Secondary | ICD-10-CM | POA: Diagnosis present

## 2018-04-26 DIAGNOSIS — R066 Hiccough: Secondary | ICD-10-CM

## 2018-04-26 DIAGNOSIS — D696 Thrombocytopenia, unspecified: Secondary | ICD-10-CM | POA: Diagnosis present

## 2018-04-26 DIAGNOSIS — F17210 Nicotine dependence, cigarettes, uncomplicated: Secondary | ICD-10-CM | POA: Diagnosis present

## 2018-04-26 DIAGNOSIS — D72829 Elevated white blood cell count, unspecified: Secondary | ICD-10-CM | POA: Diagnosis not present

## 2018-04-26 DIAGNOSIS — E876 Hypokalemia: Secondary | ICD-10-CM

## 2018-04-26 DIAGNOSIS — R109 Unspecified abdominal pain: Secondary | ICD-10-CM | POA: Diagnosis present

## 2018-04-26 DIAGNOSIS — E0500 Thyrotoxicosis with diffuse goiter without thyrotoxic crisis or storm: Secondary | ICD-10-CM | POA: Diagnosis present

## 2018-04-26 DIAGNOSIS — Z79890 Hormone replacement therapy (postmenopausal): Secondary | ICD-10-CM

## 2018-04-26 DIAGNOSIS — R112 Nausea with vomiting, unspecified: Secondary | ICD-10-CM | POA: Diagnosis not present

## 2018-04-26 DIAGNOSIS — F1920 Other psychoactive substance dependence, uncomplicated: Secondary | ICD-10-CM | POA: Diagnosis present

## 2018-04-26 DIAGNOSIS — Z9114 Patient's other noncompliance with medication regimen: Secondary | ICD-10-CM

## 2018-04-26 DIAGNOSIS — R1084 Generalized abdominal pain: Secondary | ICD-10-CM

## 2018-04-26 DIAGNOSIS — I81 Portal vein thrombosis: Principal | ICD-10-CM | POA: Diagnosis present

## 2018-04-26 DIAGNOSIS — Z888 Allergy status to other drugs, medicaments and biological substances status: Secondary | ICD-10-CM

## 2018-04-26 DIAGNOSIS — Z9119 Patient's noncompliance with other medical treatment and regimen: Secondary | ICD-10-CM

## 2018-04-26 DIAGNOSIS — E782 Mixed hyperlipidemia: Secondary | ICD-10-CM | POA: Diagnosis present

## 2018-04-26 DIAGNOSIS — F419 Anxiety disorder, unspecified: Secondary | ICD-10-CM | POA: Diagnosis present

## 2018-04-26 DIAGNOSIS — Z79899 Other long term (current) drug therapy: Secondary | ICD-10-CM

## 2018-04-26 DIAGNOSIS — K567 Ileus, unspecified: Secondary | ICD-10-CM | POA: Diagnosis present

## 2018-04-26 DIAGNOSIS — R188 Other ascites: Secondary | ICD-10-CM | POA: Diagnosis present

## 2018-04-26 DIAGNOSIS — I10 Essential (primary) hypertension: Secondary | ICD-10-CM | POA: Diagnosis present

## 2018-04-26 DIAGNOSIS — F332 Major depressive disorder, recurrent severe without psychotic features: Secondary | ICD-10-CM | POA: Diagnosis present

## 2018-04-26 DIAGNOSIS — R079 Chest pain, unspecified: Secondary | ICD-10-CM

## 2018-04-26 DIAGNOSIS — Z91199 Patient's noncompliance with other medical treatment and regimen due to unspecified reason: Secondary | ICD-10-CM

## 2018-04-26 DIAGNOSIS — I82409 Acute embolism and thrombosis of unspecified deep veins of unspecified lower extremity: Secondary | ICD-10-CM

## 2018-04-26 DIAGNOSIS — F1120 Opioid dependence, uncomplicated: Secondary | ICD-10-CM | POA: Diagnosis present

## 2018-04-26 DIAGNOSIS — K828 Other specified diseases of gallbladder: Secondary | ICD-10-CM | POA: Diagnosis present

## 2018-04-26 DIAGNOSIS — E039 Hypothyroidism, unspecified: Secondary | ICD-10-CM | POA: Diagnosis present

## 2018-04-26 LAB — COMPLETE METABOLIC PANEL WITH GFR
AG Ratio: 1.6 (calc) (ref 1.0–2.5)
ALT: 12 U/L (ref 9–46)
AST: 17 U/L (ref 10–40)
Albumin: 4.8 g/dL (ref 3.6–5.1)
Alkaline phosphatase (APISO): 79 U/L (ref 40–115)
BUN: 11 mg/dL (ref 7–25)
CALCIUM: 10 mg/dL (ref 8.6–10.3)
CO2: 30 mmol/L (ref 20–32)
CREATININE: 1.1 mg/dL (ref 0.60–1.35)
Chloride: 98 mmol/L (ref 98–110)
GFR, EST NON AFRICAN AMERICAN: 93 mL/min/{1.73_m2} (ref >=60)
GFR, Est African American: 108 mL/min/{1.73_m2} (ref >=60)
GLUCOSE: 114 mg/dL — AB (ref 65–99)
Globulin: 3 g/dL (calc) (ref 1.9–3.7)
POTASSIUM: 3.8 mmol/L (ref 3.5–5.3)
Sodium: 138 mmol/L (ref 135–146)
Total Bilirubin: 1.3 mg/dL — ABNORMAL HIGH (ref 0.2–1.2)
Total Protein: 7.8 g/dL (ref 6.1–8.1)

## 2018-04-26 LAB — CBC WITH DIFFERENTIAL/PLATELET
BASOS ABS: 45 {cells}/uL (ref 0–200)
BASOS ABS: 0 10*3/uL (ref 0.0–0.1)
BASOS PCT: 0.4 %
Basophils Relative: 0 %
EOS ABS: 56 {cells}/uL (ref 15–500)
EOS ABS: 0 10*3/uL (ref 0.0–0.7)
Eosinophils Relative: 0.5 %
Eosinophils Relative: 0 %
HCT: 41.7 % (ref 39.0–52.0)
HEMATOCRIT: 42.3 % (ref 38.5–50.0)
HEMOGLOBIN: 14.7 g/dL (ref 13.2–17.1)
Hemoglobin: 14.2 g/dL (ref 13.0–17.0)
Lymphocytes Relative: 4 %
Lymphs Abs: 414 cells/uL — ABNORMAL LOW (ref 850–3900)
Lymphs Abs: 0.4 10*3/uL — ABNORMAL LOW (ref 0.7–4.0)
MCH: 29.3 pg (ref 27.0–33.0)
MCH: 30 pg (ref 26.0–34.0)
MCHC: 34.8 g/dL (ref 32.0–36.0)
MCHC: 34.1 g/dL (ref 30.0–36.0)
MCV: 84.3 fL (ref 80.0–100.0)
MCV: 88 fL (ref 78.0–100.0)
MONO ABS: 0.4 10*3/uL (ref 0.1–1.0)
MPV: 11.7 fL (ref 7.5–12.5)
Monocytes Relative: 4.6 %
Monocytes Relative: 4 %
NEUTROS ABS: 10170 {cells}/uL — AB (ref 1500–7800)
NEUTROS ABS: 8.2 10*3/uL — AB (ref 1.7–7.7)
NEUTROS PCT: 92 %
Neutrophils Relative %: 90.8 %
PLATELETS: 61 10*3/uL — AB (ref 150–400)
Platelets: 89 10*3/uL — ABNORMAL LOW (ref 140–400)
RBC: 5.02 10*6/uL (ref 4.20–5.80)
RBC: 4.74 MIL/uL (ref 4.22–5.81)
RDW: 13.4 % (ref 11.0–15.0)
RDW: 14 % (ref 11.5–15.5)
Total Lymphocyte: 3.7 %
WBC: 11.2 10*3/uL — ABNORMAL HIGH (ref 3.8–10.8)
WBC: 9 10*3/uL (ref 4.0–10.5)
WBCMIX: 515 {cells}/uL (ref 200–950)

## 2018-04-26 LAB — URINALYSIS, ROUTINE W REFLEX MICROSCOPIC
BILIRUBIN URINE: NEGATIVE
Bacteria, UA: NONE SEEN
Glucose, UA: NEGATIVE mg/dL
KETONES UR: 20 mg/dL — AB
LEUKOCYTES UA: NEGATIVE
NITRITE: NEGATIVE
PROTEIN: 30 mg/dL — AB
SPECIFIC GRAVITY, URINE: 1.025 (ref 1.005–1.030)
pH: 5 (ref 5.0–8.0)

## 2018-04-26 LAB — COMPREHENSIVE METABOLIC PANEL
ALT: 14 U/L (ref 0–44)
AST: 20 U/L (ref 15–41)
Albumin: 4.2 g/dL (ref 3.5–5.0)
Alkaline Phosphatase: 64 U/L (ref 38–126)
Anion gap: 11 (ref 5–15)
BILIRUBIN TOTAL: 1.1 mg/dL (ref 0.3–1.2)
BUN: 12 mg/dL (ref 6–20)
CO2: 26 mmol/L (ref 22–32)
Calcium: 9.3 mg/dL (ref 8.9–10.3)
Chloride: 101 mmol/L (ref 98–111)
Creatinine, Ser: 1 mg/dL (ref 0.61–1.24)
Glucose, Bld: 117 mg/dL — ABNORMAL HIGH (ref 70–99)
POTASSIUM: 3.5 mmol/L (ref 3.5–5.1)
Sodium: 138 mmol/L (ref 135–145)
TOTAL PROTEIN: 8 g/dL (ref 6.5–8.1)

## 2018-04-26 LAB — MAGNESIUM: Magnesium: 2 mg/dL (ref 1.5–2.5)

## 2018-04-26 LAB — RAPID URINE DRUG SCREEN, HOSP PERFORMED
Amphetamines: NOT DETECTED
Benzodiazepines: NOT DETECTED
Cocaine: NOT DETECTED
Opiates: NOT DETECTED
Tetrahydrocannabinol: POSITIVE — AB

## 2018-04-26 LAB — LIPASE, BLOOD: LIPASE: 20 U/L (ref 11–51)

## 2018-04-26 LAB — ETHANOL

## 2018-04-26 MED ORDER — FENTANYL CITRATE (PF) 100 MCG/2ML IJ SOLN
100.0000 ug | INTRAMUSCULAR | Status: DC | PRN
  Administered 2018-04-26 (×3): 100 ug via INTRAVENOUS
  Filled 2018-04-26 (×3): qty 2

## 2018-04-26 MED ORDER — ONDANSETRON HCL 8 MG PO TABS: ORAL_TABLET | ORAL | 0 refills | Status: DC

## 2018-04-26 MED ORDER — LORAZEPAM 2 MG/ML IJ SOLN
1.0000 mg | Freq: Once | INTRAMUSCULAR | Status: AC
  Administered 2018-04-26: 1 mg via INTRAVENOUS
  Filled 2018-04-26: qty 1

## 2018-04-26 MED ORDER — METOCLOPRAMIDE HCL 10 MG PO TABS: ORAL_TABLET | ORAL | 0 refills | Status: DC

## 2018-04-26 MED ORDER — DIAZEPAM 10 MG PO TABS: 10.0000 mg | ORAL_TABLET | Freq: Four times a day (QID) | ORAL | 0 refills | Status: DC | PRN

## 2018-04-26 MED ORDER — SODIUM CHLORIDE 0.9 % IV BOLUS
1000.0000 mL | Freq: Once | INTRAVENOUS | Status: AC
  Administered 2018-04-26: 1000 mL via INTRAVENOUS

## 2018-04-26 MED ORDER — GADOBENATE DIMEGLUMINE 529 MG/ML IV SOLN
20.0000 mL | Freq: Once | INTRAVENOUS | Status: AC | PRN
  Administered 2018-04-26: 20 mL via INTRAVENOUS

## 2018-04-26 MED ORDER — ONDANSETRON HCL 4 MG/2ML IJ SOLN
4.0000 mg | Freq: Once | INTRAMUSCULAR | Status: AC
Start: 2018-04-26 — End: 2018-04-26
  Administered 2018-04-26: 4 mg via INTRAVENOUS
  Filled 2018-04-26: qty 2

## 2018-04-26 MED ORDER — FENTANYL CITRATE (PF) 100 MCG/2ML IJ SOLN
100.0000 ug | Freq: Once | INTRAMUSCULAR | Status: AC
  Administered 2018-04-26: 100 ug via INTRAVENOUS
  Filled 2018-04-26: qty 2

## 2018-04-26 MED ORDER — OXYCODONE HCL 5 MG PO TABS: 5.0000 mg | ORAL_TABLET | ORAL | 0 refills | Status: DC | PRN

## 2018-04-26 NOTE — ED Notes (Signed)
Attempted blood draw, was unsuccessful  

## 2018-04-26 NOTE — ED Triage Notes (Signed)
Patient c/o epigastric pain x2 days. Seen yesterday for same. Reports seen at PCP this morning. C/o continued abodminal pain.

## 2018-04-26 NOTE — ED Notes (Signed)
Pt cannot use restroom at this time, aware urine specimen is needed.  

## 2018-04-26 NOTE — ED Provider Notes (Addendum)
[facility] DEPT Provider Note   CSN: [id number] Arrival date & time: 04/26/18  1524     History   Chief Complaint Chief Complaint  Patient presents with  . Abdominal Pain    HPI Kyle Gonzales is a 27 y.o. male.  HPI   He complains of pain in his upper abdomen and lower chest present for 3 days, worse with movement, present both day and night.  He was in the ED about 48 hours ago evaluated for same, had imaging and labs and was discharged.  Today who felt like he was having reflux or esophagitis and encouraged him to continue on his omeprazole and Carafate.  He also gave him a prescription for Zofran for nausea.  He has decreased appetite today but no vomiting.  He denies diarrhea.  He denies fever, chills, weakness or dizziness.  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  He denies productive cough, shortness of breath, focal weakness or paresthesia.  There are no other known modifying factors.  Past Medical History:  Diagnosis Date  . GERD (gastroesophageal reflux disease)   . Graves disease 08/18/2015  . Hyperlipidemia   . Hypothyroid   . Palpitations   . Vitamin D deficiency     Patient Active Problem List   Diagnosis Date Noted  . Hypertension 04/15/2018  . MDD (major depressive disorder), recurrent severe, without psychosis ([place]) 02/26/2018  . Non-compliance 01/14/2018  . No-show for appointment 01/14/2018  . Current severe episode of major depressive disorder without psychotic features ([place]) 11/06/2017  . Transaminitis 04/08/2017  . Abnormal LFTs 04/08/2017  . Drug addiction / Marajuana ([place]) 01/06/2017  . Hyperlipidemia, mixed 01/11/2016  . Medication management 01/11/2016  . Palpitations 12/04/2015  . Hypothyroidism 12/04/2015  . Graves disease 08/18/2015  . Vitamin D deficiency 08/11/2015    Past Surgical  History:  Procedure Laterality Date  . ANTERIOR CRUCIATE LIGAMENT REPAIR  2012  . WISDOM TOOTH EXTRACTION          Home Medications    Prior to Admission medications   Medication Sig Start Date End Date Taking? Authorizing Provider  bisoprolol-hydrochlorothiazide (ZIAC) 10-6.25 MG tablet Take 1 tablet by mouth daily. 01/22/18  Yes Liane Comber, NP  Cholecalciferol (VITAMIN D3) 5000 units CAPS Take 10,000 Units by mouth daily.   Yes [provider]  ezetimibe (ZETIA) 10 MG tablet Take 1 tablet daily for Cholesterol 02/18/18  Yes [name], MD  famotidine (PEPCID) 20 MG tablet Take 1 tablet (20 mg total) by mouth 2 (two) times daily. 04/10/18  Yes Charlesetta Shanks, MD  hydrOXYzine (ATARAX/VISTARIL) 25 MG tablet Take 1 tablet (25 mg total) by mouth 3 (three) times daily as needed for anxiety. 03/01/18  Yes Starkes, Gayland Curry, FNP  levothyroxine (SYNTHROID, LEVOTHROID) 75 MCG tablet take 1 tablet by mouth every morning ON AN EMPTY STOMACH 02/08/18  Yes [name], MD  metoCLOPramide (REGLAN) 10 MG tablet Take 1 tablet 4 x/day before meals and bedtime for Nausea & bloating 04/26/18  Yes [name], MD  omeprazole (PRILOSEC) 40 MG capsule Take 1 capsule (40 mg total) by mouth daily. Please schedule an Office visit for future refills: [phone] 04/01/18  Yes Armbruster, Carlota Raspberry, MD  ondansetron [facility]) 8 MG tablet Take 1 tablet 3 x/ day before meals - Nausea 04/26/18  Yes [name], MD  sertraline (ZOLOFT) 100 MG tablet Take 1 tablet (  100 mg total) by mouth at bedtime. 04/01/18  Yes Hisada, Elie Goody, MD  sucralfate (CARAFATE) 1 g tablet Take 1 tablet (1 g total) by mouth 2 (two) times daily. 04/25/18  Yes Antonietta Breach, PA-C  traZODone (DESYREL) 50 MG tablet Take 1 tablet (50 mg total) by mouth at bedtime as needed for sleep. 03/01/18  Yes Starkes, Gayland Curry, FNP  diazepam (VALIUM) 10 MG tablet Take 1 tablet (10 mg total) by mouth every 6 (six) hours as needed (Muscle spasm).  04/26/18   Daleen Bo, MD  oxyCODONE (OXY IR/ROXICODONE) 5 MG immediate release tablet Take 1 tablet (5 mg total) by mouth every 4 (four) hours as needed for severe pain. 04/26/18   Daleen Bo, MD    Family History Family History  Problem Relation Age of Onset  . Diabetes Mother   . Colon cancer Maternal Grandmother   . Stomach cancer Maternal Grandmother   . Diabetes Maternal Grandmother   . Stomach cancer Maternal Grandfather   . Diabetes Maternal Grandfather     Social History Social History   Tobacco Use  . Smoking status: Current Some Day Smoker  . Smokeless tobacco: Never Used  . Tobacco comment: Marijuana  Substance Use Topics  . Alcohol use: No  . Drug use: Yes    Types: Marijuana     Allergies   Tylenol [acetaminophen]   Review of Systems Review of Systems  All other systems reviewed and are negative.    Physical Exam Updated Vital Signs BP (!) 132/91 (BP Location: Left Arm)   Pulse 77   Temp 98.2 F (36.8 C) (Oral)   Resp 16   SpO2 100%   Physical Exam  Constitutional: He is oriented to person, place, and time. He appears well-developed and well-nourished. He appears ill. He appears distressed (Standing at bedside, leaning over and supporting himself on his arms because of the abdominal pain.).  HENT:  Head: Normocephalic and atraumatic.  Right Ear: External ear normal.  Left Ear: External ear normal.  Eyes: Pupils are equal, round, and reactive to light. Conjunctivae and EOM are normal.  Neck: Normal range of motion and phonation normal. Neck supple.  Cardiovascular: Normal rate, regular rhythm and normal heart sounds.  Pulmonary/Chest: Effort normal and breath sounds normal. He exhibits no bony tenderness.  Abdominal: Soft. He exhibits no mass. There is tenderness (Diffuse, moderate). There is no rebound and no guarding.  Genitourinary:  Genitourinary Comments: No costovertebral angle tenderness with percussion  Musculoskeletal: Normal  range of motion.  Neurological: He is alert and oriented to person, place, and time. No cranial nerve deficit or sensory deficit. He exhibits normal muscle tone. Coordination normal.  Skin: Skin is warm, dry and intact.  Psychiatric: He has a normal mood and affect. His behavior is normal. Judgment and thought content normal.  Nursing note and vitals reviewed.    ED Treatments / Results  Labs (all labs ordered are listed, but only abnormal results are displayed) Labs Reviewed  COMPREHENSIVE METABOLIC PANEL - Abnormal; Notable for the following components:      Result Value   Glucose, Bld 117 (*)    All other components within normal limits  CBC WITH DIFFERENTIAL/PLATELET - Abnormal; Notable for the following components:   Platelets 61 (*)    Neutro Abs 8.2 (*)    Lymphs Abs 0.4 (*)    All other components within normal limits  RAPID URINE DRUG SCREEN, HOSP PERFORMED - Abnormal; Notable for the following components:   Tetrahydrocannabinol  POSITIVE (*)    Barbiturates   (*)    Value: Result not available. Reagent lot number recalled by manufacturer.   All other components within normal limits  URINALYSIS, ROUTINE W REFLEX MICROSCOPIC - Abnormal; Notable for the following components:   Hgb urine dipstick MODERATE (*)    Ketones, ur 20 (*)    Protein, ur 30 (*)    All other components within normal limits  ETHANOL  LIPASE, BLOOD    EKG None  Radiology Mr Abdomen W Or Wo Contrast  Result Date: 04/26/2018 CLINICAL DATA:  Followup cholecystitis and cholangitis. History of primary sclerosing cholangitis. EXAM: MRI ABDOMEN WITHOUT AND WITH CONTRAST TECHNIQUE: Multiplanar multisequence MR imaging of the abdomen was performed both before and after the administration of intravenous contrast. CONTRAST:  74mL MULTIHANCE GADOBENATE DIMEGLUMINE 529 MG/ML IV SOLN COMPARISON:  Ultrasound 04/24/2018.  MRI 11/20/2017 and 02/13/2017. FINDINGS: Lower chest:  The visualized lower chest appears  unremarkable. Hepatobiliary: Typical hemangiomas are again noted within the right hepatic lobe, measuring 2.0 cm on image 15/5 and 13 mm on image 25/5. These demonstrate typical enhancement. There are no suspicious hepatic findings. There is stable mild intrahepatic biliary dilatation and irregularity consistent with sclerosing cholangitis. There is dependent sludge within the gallbladder. No evidence of choledocholithiasis or extrahepatic biliary dilatation. 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 Pancreas: Unremarkable. No pancreatic ductal dilatation or surrounding inflammatory changes. Spleen: Normal in size without focal abnormality. Adrenals/Urinary Tract: Both adrenal glands appear normal. The kidneys appear normal without evidence of urinary tract calculus or hydronephrosis. Bladder not imaged. Stomach/Bowel: The stomach appears decompressed. There are multiple mildly dilated loops of small bowel in the mid abdomen. The visualized colon appears normal in caliber. Vascular/Lymphatic: There are no enlarged abdominal lymph nodes. No acute vascular findings are seen. 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 Other: There is a small amount of ascites which appears new. No focal extraluminal fluid collection seen. Musculoskeletal: No acute or significant osseous findings. IMPRESSION: 1. Mild small bowel dilatation and ascites. Findings could be secondary to ileus and peritonitis. Radiographic follow up recommended to exclude early obstruction. 2. Stable appearance of the biliary system consistent with sclerosing cholangitis. No significant biliary dilatation or evidence of choledocholithiasis. Mild gallbladder sludge. 3. 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 4. Stable hepatic hemangiomas. Electronically Signed   By: Richardean Sale M.D.   On: 04/26/2018 23:15   US Abdomen Complete  Result Date: 04/25/2018 CLINICAL DATA:  Abdominal pain today.  History of primary sclerosing cholangitis. EXAM: ABDOMEN ULTRASOUND COMPLETE COMPARISON:  MRI of the liver November 20, 2017. FINDINGS: Gallbladder: Layering echogenic gallbladder sludge without discrete gallstone. No pericholecystic fluid. No gallbladder wall thickening. No sonographic Murphy sign elicited. Common bile duct: Diameter: 4 mm. Liver: Echogenic hepatic lesions measuring to 2.7 cm previously characterized as hemangiomas. Mildly prominent intrahepatic biliary dilatation, unchanged. Within normal limits in parenchymal echogenicity. Portal vein is patent on color Doppler imaging with normal direction of blood flow towards the liver. 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 Portal vein is not discretely identified. IVC: No abnormality visualized. Pancreas: Visualized portion unremarkable. Spleen: Size and appearance within normal limits. Right Kidney: Length: 12 cm. Echogenicity within normal limits. No mass or hydronephrosis visualized. Left Kidney: Length: 12.1 cm. Echogenicity within normal limits. No mass or hydronephrosis visualized. Abdominal aorta: No aneurysm visualized. Other findings: None. IMPRESSION: 1. Gallbladder sludge without sonographic findings of acute cholecystitis. 2. Chronic cavernous transformation of portal vein. 3. Chronic  biliary duct dilatation consistent with known sclerosing cholangitis. Electronically Signed   By: Elon Alas M.D.   On: 04/25/2018 00:39    Procedures Procedures (including critical care time)  Medications Ordered in ED Medications  fentaNYL (SUBLIMAZE) injection 100 mcg (100 mcg Intravenous Given 04/26/18 2041)  sodium chloride 0.9 % bolus 1,000 mL (0 mLs Intravenous Stopped 04/26/18 1848)  ondansetron (ZOFRAN) injection 4 mg (4 mg Intravenous Given 04/26/18 1750)  gadobenate dimeglumine (MULTIHANCE) injection 20 mL (20 mLs Intravenous Contrast Given 04/26/18 2242)  LORazepam (ATIVAN) injection 1 mg (1 mg Intravenous Given 04/26/18 2347)  fentaNYL  (SUBLIMAZE) injection 100 mcg (100 mcg Intravenous Given 04/26/18 2349)     Initial Impression / Assessment and Plan / ED Course  I have reviewed the triage vital signs and the nursing notes.  Pertinent labs & imaging results that were available during my care of the patient were reviewed by me and considered in my medical decision making (see chart for details).  Clinical Course as of Jul 13 0000  Fri Apr 26, 2018  2351 Normal except mild elevation in hemoglobin, ketones and protein  Urinalysis, Routine w reflex microscopic(!) [EW]  2351 Normal except presence of THC  Urine rapid drug screen (hosp performed)(!) [EW]  2351 Normal   CBC with Differential(!) [EW]  2351 No significant acute abnormalities.  Possible nonspecific ileus.  Images reviewed by me  MR Abdomen W or Wo Contrast [EW]    Clinical Course User Index [EW] Daleen Bo, MD     Patient Vitals for the past 24 hrs:  BP Temp Temp src Pulse Resp SpO2  04/26/18 2002 (!) 132/91 - - 77 16 100 %  04/26/18 1900 129/66 - - 63 - 98 %  04/26/18 1800 (!) 141/82 - - 62 - 98 %  04/26/18 1748 (!) 143/73 - - 77 18 100 %  04/26/18 1536 120/81 98.2 F (36.8 C) Oral 85 18 100 %    11:33 PM Reevaluation with update and discussion. After initial assessment and treatment, an updated evaluation reveals patient is now lying down supine in the bed, and complaining of pain in his right clavicle.  He states this pain occurred when he got off the MRI table onto the stretcher.  He is not complaining of abdominal pain at this time.  He states that his pain currently is 10/10.  He agreed to try additional medication.  Patient's family member who is with him updated on findings. Daleen Bo   Medical Decision Making: Nonspecific abdominal and chest pain, MRI is reassuring without evidence for acute problems related to prior diagnosis of primary sclerosing cholangitis.  Stable chronic abnormalities of gallbladder sludge, and liver hemangiomas.   Mild ileus is nonspecific.  Mild ascites is nonspecific.  Patient is not presenting with signs and symptoms of bowel obstruction.  This is unlikely to represent esophagitis as a primary cause of his discomfort.  Patient treated symptomatically for musculoskeletal pain.  CRITICAL CARE-no Performed by: Daleen Bo   Nursing Notes Reviewed/ Care Coordinated Applicable Imaging Reviewed Interpretation of Laboratory Data incorporated into ED treatment  12:01 AM-patient feels like he cannot go home, until his pain is controlled.  He would like to be admitted overnight, to receive ongoing treatment prior to discharge.  12:02 AM-Consult complete with hospitalist. Patient case explained and discussed. she agrees to admit patient for further evaluation and treatment. Call ended at 12:15 AM    Final Clinical Impressions(s) / ED Diagnoses   Final diagnoses:  Abdominal  pain, unspecified abdominal location  Chest pain, unspecified type    ED Discharge Orders        Ordered    oxyCODONE (OXY IR/ROXICODONE) 5 MG immediate release tablet  Every 4 hours PRN     04/26/18 2349    diazepam (VALIUM) 10 MG tablet  Every 6 hours PRN     04/26/18 2349       Daleen Bo, MD 04/26/18 2352      Daleen Bo, MD 04/27/18 0021

## 2018-04-26 NOTE — Progress Notes (Signed)
Subjective:    Patient ID: Kyle Gonzales, male    DOB: October 05, 1991, 27 y.o.   MRN: [id number]  HPI   This 27 yo single BM with HTN, Hypothyroidism, HLD, GERD, Vit D Deficiency, Primary Sclerosing Cholangitis (PSC), Depression and Illicit drug Use was just evaluated in the ER on 04/24/2018 for abdominal pain with Abd. U/S showing GB sludge. WBC was slightly elevated at 13.4, low K 3.2 and he was given iv famodipine, ondansetron, Hydromorphone, Ketorolac, then dicyclomine IM and oral GI cocktail during the course of his evaluation and his sx's clinically improved and he was discharged with return precautions. Since then he has experienced intermittent abdominal cramping with no N/V.   Medication Sig  . bisoprolol-hydrochlorothiazide (ZIAC) 10-6.25 MG tablet Take 1 tablet by mouth daily.  . Cholecalciferol (VITAMIN D3) 5000 units CAPS Take 10,000 Units by mouth daily.  Marland Kitchen ezetimibe (ZETIA) 10 MG tablet Take 1 tablet daily for Cholesterol  . famotidine (PEPCID) 20 MG tablet Take 1 tablet (20 mg total) by mouth 2 (two) times daily.  . hydrOXYzine (ATARAX/VISTARIL) 25 MG tablet Take 1 tablet (25 mg total) by mouth 3 (three) times daily as needed for anxiety.  Marland Kitchen levothyroxine (SYNTHROID, LEVOTHROID) 75 MCG tablet take 1 tablet by mouth every morning ON AN EMPTY STOMACH  . omeprazole (PRILOSEC) 40 MG capsule Take 1 capsule (40 mg total) by mouth daily. Please schedule an Office visit for future refills: [phone]  . ondansetron (ZOFRAN) 4 MG tablet Take 1 tablet (4 mg total) by mouth every 8 (eight) hours as needed for nausea or vomiting.  . sertraline (ZOLOFT) 100 MG tablet Take 1 tablet (100 mg total) by mouth at bedtime.  . sucralfate (CARAFATE) 1 g tablet Take 1 tablet (1 g total) by mouth 2 (two) times daily.  . traZODone (DESYREL) 50 MG tablet Take 1 tablet (50 mg total) by mouth at bedtime as needed for sleep.  . ranitidine (ZANTAC) 300 MG tablet Take 1 tablet as needed for heartburn.  Do not  exceed 2 tablets in 24 hours. (Patient not taking: Reported on 04/10/2018)   Past Medical History:  Diagnosis Date  . GERD (gastroesophageal reflux disease)   . Graves disease 08/18/2015  . Hyperlipidemia   . Hypothyroid   . Palpitations   . Vitamin D deficiency    Past Surgical History:  Procedure Laterality Date  . ANTERIOR CRUCIATE LIGAMENT REPAIR  2012  . WISDOM TOOTH EXTRACTION     Review of Systems    10 point systems review negative except as above.    Objective:   Physical Exam  BP 132/84   Pulse 80   Temp (!) 97.3 F (36.3 C)   Resp 16   Ht 6\' 3"  (1.905 m)   Wt 220 lb 9.6 oz (100.1 kg)   BMI 27.57 kg/m   HEENT - WNL. Neck - supple.  Chest - Clear equal BS. Cor - Nl HS. RRR w/o sig m. No edema. Abd - soft, flat with sl diffuse tenderness, but for focal points and no guarding or rebound. BS active. MS- FROM w/o deformities.  Gait Nl. Neuro -  Nl w/o focal abnormalities.    Assessment & Plan:   1. Generalized abdominal pain  - CBC with Differential/Platelet - COMPLETE METABOLIC PANEL WITH GFR - metoCLOPramide (REGLAN) 10 MG tablet; Take 1 tablet 4 x/day before meals and bedtime for Nausea & bloating  Dispense: 60 tablet; Refill: 0  2. Non-intractable vomiting with nausea, unspecified vomiting type  -  metoCLOPramide (REGLAN) 10 MG tablet; Take 1 tablet 4 x/day before meals and bedtime for Nausea & bloating  Dispense: 60 tablet; Refill: 0 - ondansetron (ZOFRAN) 8 MG tablet; Take 1 tablet 3 x/ day before meals - Nausea  Dispense: 60 tablet; Refill: 0  3. Singultus  - metoCLOPramide (REGLAN) 10 MG tablet; Take 1 tablet 4 x/day before meals and bedtime for Nausea & bloating  Dispense: 60 tablet; Refill: 0  4. Leukocytosis, unspecified type  - CBC with Differential/Platelet  5. Hypokalemia  - COMPLETE METABOLIC PANEL WITH GFR  6. Medication management  - CBC with Differential/Platelet - COMPLETE METABOLIC PANEL WITH GFR - Magnesium

## 2018-04-26 NOTE — Patient Instructions (Addendum)
   Use up the Carafate (Sulcrafate) Liquid 4 x/day - before meals & Bedtime  After use up the liquid Sulcrafate  Then can use the tablets - dissolve in 4-6 oz of water and also take same schedule 4 x/day   Take Prilosec  (Omeprazole) 40 mg capsule  1st thing in the morning  Take Pepcid (Famodipine) 20 mg tablet at Bedtime  Take the Zofran 8 mg 3 x/d before meals   [place] diet as discussed   If abdominal pain worse or uncontrolled nausea/vomitting , go to ER

## 2018-04-27 ENCOUNTER — Encounter (HOSPITAL_COMMUNITY): Payer: Self-pay | Admitting: Radiology

## 2018-04-27 ENCOUNTER — Other Ambulatory Visit: Payer: Self-pay

## 2018-04-27 ENCOUNTER — Encounter: Payer: Self-pay | Admitting: Internal Medicine

## 2018-04-27 ENCOUNTER — Observation Stay (HOSPITAL_COMMUNITY): Payer: 59

## 2018-04-27 DIAGNOSIS — I10 Essential (primary) hypertension: Secondary | ICD-10-CM

## 2018-04-27 DIAGNOSIS — F1920 Other psychoactive substance dependence, uncomplicated: Secondary | ICD-10-CM

## 2018-04-27 DIAGNOSIS — K8301 Primary sclerosing cholangitis: Secondary | ICD-10-CM | POA: Diagnosis not present

## 2018-04-27 DIAGNOSIS — R079 Chest pain, unspecified: Secondary | ICD-10-CM | POA: Diagnosis not present

## 2018-04-27 DIAGNOSIS — R1031 Right lower quadrant pain: Secondary | ICD-10-CM | POA: Diagnosis not present

## 2018-04-27 DIAGNOSIS — E782 Mixed hyperlipidemia: Secondary | ICD-10-CM

## 2018-04-27 DIAGNOSIS — F332 Major depressive disorder, recurrent severe without psychotic features: Secondary | ICD-10-CM

## 2018-04-27 DIAGNOSIS — R1084 Generalized abdominal pain: Secondary | ICD-10-CM | POA: Diagnosis not present

## 2018-04-27 DIAGNOSIS — R109 Unspecified abdominal pain: Secondary | ICD-10-CM | POA: Diagnosis present

## 2018-04-27 LAB — CBC
HEMATOCRIT: 38.6 % — AB (ref 39.0–52.0)
Hemoglobin: 12.8 g/dL — ABNORMAL LOW (ref 13.0–17.0)
MCH: 29.6 pg (ref 26.0–34.0)
MCHC: 33.2 g/dL (ref 30.0–36.0)
MCV: 89.1 fL (ref 78.0–100.0)
Platelets: 67 10*3/uL — ABNORMAL LOW (ref 150–400)
RBC: 4.33 MIL/uL (ref 4.22–5.81)
RDW: 14.1 % (ref 11.5–15.5)
WBC: 5.8 10*3/uL (ref 4.0–10.5)

## 2018-04-27 LAB — SEDIMENTATION RATE: Sed Rate: 70 mm/hr — ABNORMAL HIGH (ref 0–16)

## 2018-04-27 LAB — MAGNESIUM: Magnesium: 2 mg/dL (ref 1.7–2.4)

## 2018-04-27 LAB — COMPREHENSIVE METABOLIC PANEL
ALT: 12 U/L (ref 0–44)
AST: 15 U/L (ref 15–41)
Albumin: 3.6 g/dL (ref 3.5–5.0)
Alkaline Phosphatase: 62 U/L (ref 38–126)
Anion gap: 10 (ref 5–15)
BUN: 11 mg/dL (ref 6–20)
CHLORIDE: 101 mmol/L (ref 98–111)
CO2: 28 mmol/L (ref 22–32)
Calcium: 9.3 mg/dL (ref 8.9–10.3)
Creatinine, Ser: 1 mg/dL (ref 0.61–1.24)
Glucose, Bld: 110 mg/dL — ABNORMAL HIGH (ref 70–99)
POTASSIUM: 3.5 mmol/L (ref 3.5–5.1)
SODIUM: 139 mmol/L (ref 135–145)
Total Bilirubin: 1 mg/dL (ref 0.3–1.2)
Total Protein: 7.4 g/dL (ref 6.5–8.1)

## 2018-04-27 LAB — TSH: TSH: 5.591 u[IU]/mL — AB (ref 0.350–4.500)

## 2018-04-27 LAB — PHOSPHORUS: PHOSPHORUS: 2.2 mg/dL — AB (ref 2.5–4.6)

## 2018-04-27 LAB — HIV ANTIBODY (ROUTINE TESTING W REFLEX): HIV SCREEN 4TH GENERATION: NONREACTIVE

## 2018-04-27 LAB — TROPONIN I

## 2018-04-27 LAB — LACTIC ACID, PLASMA: Lactic Acid, Venous: 1 mmol/L (ref 0.5–1.9)

## 2018-04-27 MED ORDER — PANTOPRAZOLE SODIUM 40 MG PO TBEC
40.0000 mg | DELAYED_RELEASE_TABLET | Freq: Every day | ORAL | Status: DC
  Administered 2018-04-28 – 2018-04-29 (×2): 40 mg via ORAL
  Filled 2018-04-27 (×2): qty 1

## 2018-04-27 MED ORDER — POTASSIUM PHOSPHATES 15 MMOLE/5ML IV SOLN
10.0000 meq | Freq: Once | INTRAVENOUS | Status: AC
  Administered 2018-04-27: 10 meq via INTRAVENOUS
  Filled 2018-04-27: qty 2.27

## 2018-04-27 MED ORDER — ACETAMINOPHEN 325 MG PO TABS: 650.0000 mg | ORAL_TABLET | Freq: Four times a day (QID) | ORAL | Status: DC | PRN

## 2018-04-27 MED ORDER — PREDNISONE 20 MG PO TABS
20.0000 mg | ORAL_TABLET | Freq: Every day | ORAL | Status: DC
  Administered 2018-04-27 – 2018-04-29 (×3): 20 mg via ORAL
  Filled 2018-04-27 (×3): qty 1

## 2018-04-27 MED ORDER — SUCRALFATE 1 G PO TABS
1.0000 g | ORAL_TABLET | Freq: Two times a day (BID) | ORAL | Status: DC
  Administered 2018-04-28 – 2018-04-29 (×3): 1 g via ORAL
  Filled 2018-04-27 (×3): qty 1

## 2018-04-27 MED ORDER — MORPHINE SULFATE (PF) 2 MG/ML IV SOLN
2.0000 mg | INTRAVENOUS | Status: DC | PRN
  Administered 2018-04-27: 2 mg via INTRAVENOUS
  Administered 2018-04-27: 3 mg via INTRAVENOUS
  Administered 2018-04-27 – 2018-04-28 (×5): 2 mg via INTRAVENOUS
  Filled 2018-04-27 (×2): qty 1
  Filled 2018-04-27: qty 2
  Filled 2018-04-27 (×4): qty 1

## 2018-04-27 MED ORDER — IOPAMIDOL (ISOVUE-300) INJECTION 61%
100.0000 mL | Freq: Once | INTRAVENOUS | Status: AC | PRN
  Administered 2018-04-27: 100 mL via INTRAVENOUS

## 2018-04-27 MED ORDER — IOPAMIDOL (ISOVUE-300) INJECTION 61%
INTRAVENOUS | Status: AC
  Filled 2018-04-27: qty 100

## 2018-04-27 MED ORDER — LEVOTHYROXINE SODIUM 75 MCG PO TABS
75.0000 ug | ORAL_TABLET | Freq: Every day | ORAL | Status: DC
  Administered 2018-04-28 – 2018-05-02 (×5): 75 ug via ORAL
  Filled 2018-04-27 (×5): qty 1

## 2018-04-27 MED ORDER — ONDANSETRON HCL 4 MG PO TABS: 4.0000 mg | ORAL_TABLET | Freq: Four times a day (QID) | ORAL | Status: DC | PRN

## 2018-04-27 MED ORDER — ONDANSETRON HCL 4 MG/2ML IJ SOLN
4.0000 mg | Freq: Four times a day (QID) | INTRAMUSCULAR | Status: DC | PRN
  Administered 2018-04-27 – 2018-05-02 (×8): 4 mg via INTRAVENOUS
  Filled 2018-04-27 (×8): qty 2

## 2018-04-27 MED ORDER — ACETAMINOPHEN 650 MG RE SUPP: 650.0000 mg | Freq: Four times a day (QID) | RECTAL | Status: DC | PRN

## 2018-04-27 MED ORDER — SODIUM CHLORIDE 0.9 % IV SOLN
INTRAVENOUS | Status: AC
  Administered 2018-04-27: 03:00:00 via INTRAVENOUS

## 2018-04-27 NOTE — ED Notes (Signed)
Report called to floor but pt. Has to go to CT scan prior to transferring to the floor.

## 2018-04-27 NOTE — Consult Note (Addendum)
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  His liver chemistries are normal and U/S, CT scan, and MRI all show stable biliary duct dilation / cavernous transformation of PV. His ESR is quite elevated at 70 however.  He does have gallbladder sludge without acute cholecystitis on imaging.  -Given hx ? Small duct PSC and elevated ESR it is reasonable to try prednisone. 20 mg daily -PPI.  -With gallbladder sludge will consider HIDA  -Will also consider EGD given majority of pain is upper with associated N/V -agree with carafate , PPI and anti-emetics for now - ? inflamed appendix on imaging. Surgery doesn't feel he has appendicitis and agree location of pain makes it unlikely.    HPI: Kyle Gonzales is a 27 y.o. male with a history of Graves' disease, anxiety and significant depression requiring inpatient care in May of this year. We evaluated Kyle Gonzales in March 2018 for chronically abnormal liver chemistries, abdominal pain and nausea.  Imaging showed intrahepatic biliary duct dilation and cavernous transformation of the portal vein.  Liver biopsy however was essentially unremarkable.  She was referred to [facility] liver care for further evaluation.  He saw Dr. 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  Last MR was February 2019 with findings of stable mild dilation and irregularity of the intrahepatic ducts consistent with sclerosing cholangitis.  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  Since [facility] can be associated with IBD, patient  underwent colonoscopy with biopsies and everything was normal  Patient presented to the emergency department 3 days ago for evaluation of epigastric pain.  Pain  initially intermittent, became more constant.  To me patient described more generalized abdominal pain though the worst of it is across the upper abdomen and RUQ. There is no radiation of pain to the back. Pain not worse with PO intake but only consuming crackers and applesauce. The pain definitely gets worse when standing / walking / moving about.  He has some associated nausea and vomiting.. No significant NSAID use. No associated bowel changes.  No fevers.  Pain could not be controlled in the ED and patient was admitted for further evaluation and treatment.  In the ED white count was 13.4.  Liver chemistries were normal.  Ultrasound showed gallbladder sludge without findings of acute cholecystitis.  CT scan was limited by motion and lack of enteric contrast but there was question of inflammation of the appendix.  Liver and biliary duct with same chronic changes.  MRI shows mild small bowel dilation and ascites.  Stable appearance of the biliary system.  Mild gallbladder sludge.  Surgery evaluated patient for possible appendicitis.  They do not feel patient has appendicitis, GI evaluation recommended.     Past Medical History:  Diagnosis Date  . GERD (gastroesophageal reflux disease)   . Graves disease 08/18/2015  . Hyperlipidemia   . Hypothyroid   . Palpitations   . Vitamin D deficiency     Past Surgical History:  Procedure Laterality Date  . ANTERIOR CRUCIATE LIGAMENT REPAIR  2012  . WISDOM TOOTH EXTRACTION      Prior to Admission medications   Medication Sig Start Date End Date Taking? Authorizing Provider  bisoprolol-hydrochlorothiazide (  ZIAC) 10-6.25 MG tablet Take 1 tablet by mouth daily. 01/22/18  Yes Liane Comber, NP  Cholecalciferol (VITAMIN D3) 5000 units CAPS Take 10,000 Units by mouth daily.   Yes [provider]  ezetimibe (ZETIA) 10 MG tablet Take 1 tablet daily for Cholesterol 02/18/18  Yes [name], MD  famotidine (PEPCID) 20 MG tablet Take 1 tablet (20 mg  total) by mouth 2 (two) times daily. 04/10/18  Yes Charlesetta Shanks, MD  hydrOXYzine (ATARAX/VISTARIL) 25 MG tablet Take 1 tablet (25 mg total) by mouth 3 (three) times daily as needed for anxiety. 03/01/18  Yes Starkes, Gayland Curry, FNP  levothyroxine (SYNTHROID, LEVOTHROID) 75 MCG tablet take 1 tablet by mouth every morning ON AN EMPTY STOMACH 02/08/18  Yes [name], MD  metoCLOPramide (REGLAN) 10 MG tablet Take 1 tablet 4 x/day before meals and bedtime for Nausea & bloating 04/26/18  Yes [name], MD  omeprazole (PRILOSEC) 40 MG capsule Take 1 capsule (40 mg total) by mouth daily. Please schedule an Office visit for future refills: [phone] 04/01/18  Yes Armbruster, Carlota Raspberry, MD  ondansetron [facility]) 8 MG tablet Take 1 tablet 3 x/ day before meals - Nausea 04/26/18  Yes [name], MD  sertraline (ZOLOFT) 100 MG tablet Take 1 tablet (100 mg total) by mouth at bedtime. 04/01/18  Yes Hisada, Elie Goody, MD  sucralfate (CARAFATE) 1 g tablet Take 1 tablet (1 g total) by mouth 2 (two) times daily. 04/25/18  Yes Antonietta Breach, PA-C  traZODone (DESYREL) 50 MG tablet Take 1 tablet (50 mg total) by mouth at bedtime as needed for sleep. 03/01/18  Yes Starkes, Gayland Curry, FNP  diazepam (VALIUM) 10 MG tablet Take 1 tablet (10 mg total) by mouth every 6 (six) hours as needed (Muscle spasm). 04/26/18   Daleen Bo, MD  oxyCODONE (OXY IR/ROXICODONE) 5 MG immediate release tablet Take 1 tablet (5 mg total) by mouth every 4 (four) hours as needed for severe pain. 04/26/18   Daleen Bo, MD    Current Facility-Administered Medications  Medication Dose Route Frequency Provider Last Rate Last Dose  . 0.9 %  sodium chloride infusion   Intravenous Continuous Toy Baker, MD 100 mL/hr at 04/27/18 0257    . acetaminophen (TYLENOL) tablet 650 mg  650 mg Oral Q6H PRN Toy Baker, MD       Or  . acetaminophen (TYLENOL) suppository 650 mg  650 mg Rectal Q6H PRN Doutova, Anastassia, MD      .  iopamidol (ISOVUE-300) 61 % injection           . [START ON 04/28/2018] levothyroxine (SYNTHROID, LEVOTHROID) tablet 75 mcg  75 mcg Oral QAC breakfast Doutova, Anastassia, MD      . morphine 2 MG/ML injection 2-3 mg  2-3 mg Intravenous Q4H PRN Toy Baker, MD   2 mg at 04/27/18 0740  . ondansetron (ZOFRAN) tablet 4 mg  4 mg Oral Q6H PRN Toy Baker, MD       Or  . ondansetron (ZOFRAN) injection 4 mg  4 mg Intravenous Q6H PRN Toy Baker, MD      . Derrill Memo ON 04/28/2018] pantoprazole (PROTONIX) EC tablet 40 mg  40 mg Oral Daily Doutova, Anastassia, MD      . Derrill Memo ON 04/28/2018] sucralfate (CARAFATE) tablet 1 g  1 g Oral BID Toy Baker, MD        Allergies as of 04/26/2018 - Review Complete 04/26/2018  Allergen Reaction Noted  . Tylenol [acetaminophen]  01/03/2017  Family History  Problem Relation Age of Onset  . Diabetes Mother   . Colon cancer Maternal Grandmother   . Stomach cancer Maternal Grandmother   . Diabetes Maternal Grandmother   . Stomach cancer Maternal Grandfather   . Diabetes Maternal Grandfather     Social History   Socioeconomic History  . Marital status: Single    Spouse name: Not on file  . Number of children: Not on file  . Years of education: Not on file  . Highest education level: Not on file  Occupational History  . Not on file  Social Needs  . Financial resource strain: Not on file  . Food insecurity:    Worry: Not on file    Inability: Not on file  . Transportation needs:    Medical: Not on file    Non-medical: Not on file  Tobacco Use  . Smoking status: Current Some Day Smoker  . Smokeless tobacco: Never Used  . Tobacco comment: Marijuana  Substance and Sexual Activity  . Alcohol use: No  . Drug use: Yes    Types: Marijuana  . Sexual activity: Yes  Lifestyle  . Physical activity:    Days per week: Not on file    Minutes per session: Not on file  . Stress: Not on file  Relationships  . Social  connections:    Talks on phone: Not on file    Gets together: Not on file    Attends religious service: Not on file    Active member of club or organization: Not on file    Attends meetings of clubs or organizations: Not on file    Relationship status: Not on file  . Intimate partner violence:    Fear of current or ex partner: Not on file    Emotionally abused: Not on file    Physically abused: Not on file    Forced sexual activity: Not on file  Other Topics Concern  . Not on file  Social History Narrative  . Not on file    Review of Systems: All systems reviewed and negative except where noted in HPI.  Physical Exam: Vital signs in last 24 hours: Temp:  [98.2 F (36.8 C)-98.3 F (36.8 C)] 98.3 F (36.8 C) (07/13 0229) Pulse Rate:  [62-85] 72 (07/13 0229) Resp:  [14-20] 20 (07/13 0229) BP: (120-144)/(66-91) 143/88 (07/13 0229) SpO2:  [91 %-100 %] 96 % (07/13 0229) Weight:  [221 lb 5.5 oz (100.4 kg)] 221 lb 5.5 oz (100.4 kg) (07/13 0257)   General:   Alert, well-developed, male in NAD Psych:   cooperative. Flat affect. Eyes:  Pupils equal, sclera clear, no icterus.   Conjunctiva pink. Ears:  Normal auditory acuity. Nose:  No deformity, discharge,  or lesions. Neck:  Supple; no masses Lungs:  Clear throughout to auscultation.   No wheezes, crackles, or rhonchi.  Heart:  Regular rate and rhythm; no murmurs, no edema Abdomen:  Soft, non-distended, diffusely tender with light palpation.  BS active, no palp mass   Rectal:  Deferred  Msk:  Symmetrical without gross deformities. . Neurologic:  Alert and  oriented x4;  grossly normal neurologically. Skin:  Intact without significant lesions or rashes..   Intake/Output from previous day: 07/12 0701 - 07/13 0700 In: 105 [I.V.:105] Out: -  Intake/Output this shift: Total I/O In: -  Out: 200 [Urine:200]  Lab Results: Recent Labs    04/26/18 1125 04/26/18 1843 04/27/18 0522  WBC 11.2* 9.0 5.8  HGB 14.7 14.2  12.8*    HCT 42.3 41.7 38.6*  PLT 89* 61* 67*   BMET Recent Labs    04/26/18 1125 04/26/18 1843 04/27/18 0522  NA 138 138 139  K 3.8 3.5 3.5  CL 98 101 101  CO2 _0 GLUCOSE 114* 117* 110*  BUN _1 CREATININE 1.10 1.00 1.00  CALCIUM 10.0 9.3 9.3   LFT Recent Labs    04/27/18 0522  PROT 7.4  ALBUMIN 3.6  AST 15  ALT 12  ALKPHOS 62  BILITOT 1.0   PT/INR Recent Labs    04/24/18 2249  LABPROT 14.5  INR 1.14     Studies/Results: Mr Abdomen W Or Wo Contrast  Result Date: 04/26/2018 CLINICAL DATA:  Followup cholecystitis and cholangitis. History of primary sclerosing cholangitis. EXAM: MRI ABDOMEN WITHOUT AND WITH CONTRAST TECHNIQUE: Multiplanar multisequence MR imaging of the abdomen was performed both before and after the administration of intravenous contrast. CONTRAST:  41m MULTIHANCE GADOBENATE DIMEGLUMINE 529 MG/ML IV SOLN COMPARISON:  Ultrasound 04/24/2018.  MRI 11/20/2017 and 02/13/2017. FINDINGS: Lower chest:  The visualized lower chest appears unremarkable. Hepatobiliary: Typical hemangiomas are again noted within the right hepatic lobe, measuring 2.0 cm on image 15/5 and 13 mm on image 25/5. These demonstrate typical enhancement. There are no suspicious hepatic findings. There is stable mild intrahepatic biliary dilatation and irregularity consistent with sclerosing cholangitis. There is dependent sludge within the gallbladder. No evidence of choledocholithiasis or extrahepatic biliary dilatation. 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 Pancreas: Unremarkable. No pancreatic ductal dilatation or surrounding inflammatory changes. Spleen: Normal in size without focal abnormality. Adrenals/Urinary Tract: Both adrenal glands appear normal. The kidneys appear normal without evidence of urinary tract calculus or hydronephrosis. Bladder not imaged. Stomach/Bowel: The stomach appears decompressed. There are multiple mildly dilated loops of small bowel  in the mid abdomen. The visualized colon appears normal in caliber. Vascular/Lymphatic: There are no enlarged abdominal lymph nodes. No acute vascular findings are seen. 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 Other: There is a small amount of ascites which appears new. No focal extraluminal fluid collection seen. Musculoskeletal: No acute or significant osseous findings. IMPRESSION: 1. Mild small bowel dilatation and ascites. Findings could be secondary to ileus and peritonitis. Radiographic follow up recommended to exclude early obstruction. 2. Stable appearance of the biliary system consistent with sclerosing cholangitis. No significant biliary dilatation or evidence of choledocholithiasis. Mild gallbladder sludge. 3. 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 4. Stable hepatic hemangiomas. Electronically Signed   By: WRichardean SaleM.D.   On: 04/26/2018 23:15   Ct Abdomen Pelvis W Contrast  Result Date: 04/27/2018 CLINICAL DATA:  Abdominal pain with chills but no fever for 3 days. Bowel dilatation and ascites on MRCP. History of sclerosing cholangitis. EXAM: CT ABDOMEN AND PELVIS WITH CONTRAST TECHNIQUE: Multidetector CT imaging of the abdomen and pelvis was performed using the standard protocol following bolus administration of intravenous contrast. CONTRAST:  1049mISOVUE-300 IOPAMIDOL (ISOVUE-300) INJECTION 61% COMPARISON:  MRI 04/26/2018 and 11/20/2017. FINDINGS: Lower chest: Mild linear atelectasis at both lung bases. No significant pleural or pericardial effusion. Hepatobiliary: There are known hemangiomas within the right hepatic lobe which are stable. There is stable mild intrahepatic biliary dilatation and irregularity consistent with sclerosing cholangitis. Dependent high-density sludge is present in the gallbladder lumen. There is no extrahepatic biliary dilatation. Pancreas: Unremarkable. No pancreatic ductal dilatation or surrounding inflammatory changes.  Spleen: Normal in size without focal abnormality. Adrenals/Urinary  Tract: Both adrenal glands appear normal. Both kidneys appear normal. No evidence of urinary tract calculus or hydronephrosis. There is contrast material within the bladder. No evidence of bladder wall thickening. Stomach/Bowel: The initial portal phase images are motion degraded (due to patient vomiting) and no enteric contrast was administered. Delayed imaging was performed through the pelvis. No significant small or large bowel wall thickening, distention or surrounding inflammation. Because of these limitations, confident identification of the appendix is limited, although there is an apparent blind ending tubular structure extending into the midline pelvis which measures up to 11 mm in diameter on image 65/4. This is also seen on coronal images 59 and 60 of series 9. Vascular/Lymphatic: There are no enlarged abdominal or pelvic lymph nodes. No acute vascular findings. 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 Reproductive: The prostate gland and seminal vesicles appear normal. Other: Small amount of ascites, predominately in the pelvis and subhepatic space. No focal extraluminal fluid collection. There is mild mesenteric edema. Musculoskeletal: No acute or significant osseous findings. IMPRESSION: 1. Study is limited by motion and the lack of enteric contrast. There is possible inflammation of the appendix in the low pelvis, and acute appendicitis cannot be excluded. Surgical evaluation recommended. 2. There is mild ascites and nonspecific mesenteric edema. No focal extraluminal fluid collection or bowel obstruction identified. 3. Stable appearance of the liver with mild intrahepatic biliary dilatation and irregularity attributed to sclerosing cholangitis. 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 4. These results were called by telephone at the time of interpretation on 04/27/2018 at 3:02 am to the patient's nurse Corliss Blacker, who verbally acknowledged these results. Electronically Signed   By: Richardean Sale M.D.   On: 04/27/2018 03:02     Tye Savoy, NP-C @  04/27/2018, 12:26 PM    Attending physician's note   I have taken a history, examined the patient and reviewed the chart. I agree with the Advanced Practitioner's note, impression and recommendations. 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  Mesenteric adenitis Visualization of appendix was limited, cannot exclude appendicitis.  Surgery was consulted and did not think this is acute appendicitis. We will start low-dose steroids prednisone 20 mg daily Continue pain management and supportive care If no significant improvement in pain, will consider EGD on Monday with MAC to exclude upper GI pathology   K. Denzil Magnuson , MD [phone]

## 2018-04-27 NOTE — Plan of Care (Signed)
  Problem: Pain Managment: Goal: General experience of comfort will improve Outcome: Progressing   

## 2018-04-27 NOTE — ED Notes (Signed)
ED TO INPATIENT HANDOFF REPORT  Name/Age/Gender Kyle Gonzales 27 y.o. male  Code Status Code Status History    Date Active Date Inactive Code Status Order ID Comments User Context   02/26/2018 1702 03/01/2018 2059 Full Code [id number]  Ethelene Hal, NP Inpatient   02/26/2018 0457 02/26/2018 1608 Full Code [id number]  Varney Biles, MD ED      Home/SNF/Other Home  Chief Complaint abd pain  Level of Care/Admitting Diagnosis ED Disposition    ED Disposition Condition [facility] Area: [facility] [[id number]]  Level of Care: Med-Surg [16]  Diagnosis: Abdominal pain [335456]  Admitting Physician: Toy Baker [[id number]]  Attending Physician: Toy Baker [[id number]]  PT Class (Do Not Modify): Observation [104]  PT Acc Code (Do Not Modify): Observation [10022]       Medical History Past Medical History:  Diagnosis Date  . GERD (gastroesophageal reflux disease)   . Graves disease 08/18/2015  . Hyperlipidemia   . Hypothyroid   . Palpitations   . Vitamin D deficiency     Allergies Allergies  Allergen Reactions  . Tylenol [Acetaminophen]     r/t elevated LFTs    IV Location/Drains/Wounds Patient Lines/Drains/Airways Status   Active Line/Drains/Airways    Name:   Placement date:   Placement time:   Site:   Days:   Peripheral IV 04/26/18 Right Hand   04/26/18    1708    Hand   1          Labs/Imaging Results for orders placed or performed during the hospital encounter of 04/26/18 (from the past 48 hour(s))  Comprehensive metabolic panel     Status: Abnormal   Collection Time: 04/26/18  6:43 PM  Result Value Ref Range   Sodium 138 135 - 145 mmol/L   Potassium 3.5 3.5 - 5.1 mmol/L   Chloride 101 98 - 111 mmol/L    Comment: Please note change in reference range.   CO2 26 22 - 32 mmol/L   Glucose, Bld 117 (H) 70 - 99 mg/dL    Comment: Please note change in reference range.   BUN 12 6 - 20 mg/dL    Comment: Please note  change in reference range.   Creatinine, Ser 1.00 0.61 - 1.24 mg/dL   Calcium 9.3 8.9 - 10.3 mg/dL   Total Protein 8.0 6.5 - 8.1 g/dL   Albumin 4.2 3.5 - 5.0 g/dL   AST 20 15 - 41 U/L   ALT 14 0 - 44 U/L    Comment: Please note change in reference range.   Alkaline Phosphatase 64 38 - 126 U/L   Total Bilirubin 1.1 0.3 - 1.2 mg/dL   GFR calc non Af Amer >60 >60 mL/min   GFR calc Af Amer >60 >60 mL/min    Comment: (NOTE) The eGFR has been calculated using the CKD EPI equation. This calculation has not been validated in all clinical situations. eGFR's persistently <60 mL/min signify possible Chronic Kidney Disease.    Anion gap 11 5 - 15    Comment: Performed at [facility], [place] [address] [place], [place] [address]  Ethanol     Status: None   Collection Time: 04/26/18  6:43 PM  Result Value Ref Range   Alcohol, Ethyl (B) <10 <10 mg/dL    Comment: (NOTE) Lowest detectable limit for serum alcohol is 10 mg/dL. For medical purposes only. Performed at [facility], [place] [address], [place], [place] [address]  Lipase, blood     Status: None   Collection Time: 04/26/18  6:43 PM  Result Value Ref Range   Lipase 20 11 - 51 U/L    Comment: Performed at [facility], [place] [address], [place], [place] [address]  CBC with Differential     Status: Abnormal   Collection Time: 04/26/18  6:43 PM  Result Value Ref Range   WBC 9.0 4.0 - 10.5 K/uL    Comment: WHITE COUNT CONFIRMED ON SMEAR   RBC 4.74 4.22 - 5.81 MIL/uL   Hemoglobin 14.2 13.0 - 17.0 g/dL   HCT 41.7 39.0 - 52.0 %   MCV 88.0 78.0 - 100.0 fL   MCH 30.0 26.0 - 34.0 pg   MCHC 34.1 30.0 - 36.0 g/dL   RDW 14.0 11.5 - 15.5 %   Platelets 61 (L) 150 - 400 K/uL    Comment: REPEATED TO VERIFY SPECIMEN CHECKED FOR CLOTS PLATELET COUNT CONFIRMED BY SMEAR DELTA CHECK NOTED    Neutrophils Relative % 92 %   Lymphocytes Relative 4 %   Monocytes Relative 4 %   Eosinophils  Relative 0 %   Basophils Relative 0 %   Neutro Abs 8.2 (H) 1.7 - 7.7 K/uL   Lymphs Abs 0.4 (L) 0.7 - 4.0 K/uL   Monocytes Absolute 0.4 0.1 - 1.0 K/uL   Eosinophils Absolute 0.0 0.0 - 0.7 K/uL   Basophils Absolute 0.0 0.0 - 0.1 K/uL   RBC Morphology BURR CELLS    WBC Morphology VACUOLATED NEUTROPHILS     Comment: Performed at [facility], [place] [address], [place], [place] [address]  Urine rapid drug screen (hosp performed)     Status: Abnormal   Collection Time: 04/26/18  8:03 PM  Result Value Ref Range   Opiates NONE DETECTED NONE DETECTED   Cocaine NONE DETECTED NONE DETECTED   Benzodiazepines NONE DETECTED NONE DETECTED   Amphetamines NONE DETECTED NONE DETECTED   Tetrahydrocannabinol POSITIVE (A) NONE DETECTED   Barbiturates (A) NONE DETECTED    Result not available. Reagent lot number recalled by manufacturer.    Comment: Performed at [facility], [place] [address], [place], [place] [address]  Urinalysis, Routine w reflex microscopic     Status: Abnormal   Collection Time: 04/26/18  8:03 PM  Result Value Ref Range   Color, Urine YELLOW YELLOW   APPearance CLEAR CLEAR   Specific Gravity, Urine 1.025 1.005 - 1.030   pH 5.0 5.0 - 8.0   Glucose, UA NEGATIVE NEGATIVE mg/dL   Hgb urine dipstick MODERATE (A) NEGATIVE   Bilirubin Urine NEGATIVE NEGATIVE   Ketones, ur 20 (A) NEGATIVE mg/dL   Protein, ur 30 (A) NEGATIVE mg/dL   Nitrite NEGATIVE NEGATIVE   Leukocytes, UA NEGATIVE NEGATIVE   RBC / HPF 11-20 0 - 5 RBC/hpf   WBC, UA 0-5 0 - 5 WBC/hpf   Bacteria, UA NONE SEEN NONE SEEN   Mucus PRESENT     Comment: Performed at [facility], [place] [address], [place], [place] [address]   Mr Abdomen W Or Wo Contrast  Result Date: 04/26/2018 CLINICAL DATA:  Followup cholecystitis and cholangitis. History of primary sclerosing cholangitis. EXAM: MRI ABDOMEN WITHOUT AND WITH CONTRAST TECHNIQUE: Multiplanar multisequence MR imaging of the  abdomen was performed both before and after the administration of intravenous contrast. CONTRAST:  28m MULTIHANCE GADOBENATE DIMEGLUMINE 529 MG/ML IV SOLN COMPARISON:  Ultrasound 04/24/2018.  MRI 11/20/2017 and 02/13/2017. FINDINGS: Lower chest:  The visualized lower chest appears unremarkable. Hepatobiliary: Typical hemangiomas are again noted within the right hepatic lobe, measuring 2.0 cm on image 15/5 and 13 mm on image 25/5. These demonstrate typical enhancement. There are no suspicious hepatic findings. There is stable mild intrahepatic biliary dilatation and irregularity consistent with sclerosing cholangitis. There is dependent sludge within the gallbladder. No evidence of choledocholithiasis or extrahepatic biliary dilatation. 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 Pancreas: Unremarkable. No pancreatic ductal dilatation or surrounding inflammatory changes. Spleen: Normal in size without focal abnormality. Adrenals/Urinary Tract: Both adrenal glands appear normal. The kidneys appear normal without evidence of urinary tract calculus or hydronephrosis. Bladder not imaged. Stomach/Bowel: The stomach appears decompressed. There are multiple mildly dilated loops of small bowel in the mid abdomen. The visualized colon appears normal in caliber. Vascular/Lymphatic: There are no enlarged abdominal lymph nodes. No acute vascular findings are seen. 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 Other: There is a small amount of ascites which appears new. No focal extraluminal fluid collection seen. Musculoskeletal: No acute or significant osseous findings. IMPRESSION: 1. Mild small bowel dilatation and ascites. Findings could be secondary to ileus and peritonitis. Radiographic follow up recommended to exclude early obstruction. 2. Stable appearance of the biliary system consistent with sclerosing cholangitis. No significant biliary dilatation or evidence of  choledocholithiasis. Mild gallbladder sludge. 3. 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 4. Stable hepatic hemangiomas. Electronically Signed   By: Richardean Sale M.D.   On: 04/26/2018 23:15    Pending Labs Unresulted Labs (From admission, onward)   Start     Ordered   04/27/18 0116  Haptoglobin  Add-on,   R     04/27/18 0116   04/27/18 0103  Sedimentation rate  Once,   R     04/27/18 0102   04/27/18 0044  Troponin I  Add-on,   R     04/27/18 0043   04/27/18 0044  Lactic acid, plasma  Once,   STAT     04/27/18 0043   Signed and Held  HIV antibody (Routine Testing)  Tomorrow morning,   R     Signed and Held   Signed and Held  Magnesium  Tomorrow morning,   R    Comments:  Call MD if <1.5    Signed and Held   Signed and Held  Phosphorus  Tomorrow morning,   R     Signed and Held   Signed and Held  TSH  Once,   R    Comments:  Cancel if already done within 1 month and notify MD    Signed and Held   Signed and Held  Comprehensive metabolic panel  Once,   R    Comments:  Cal MD for K<3.5 or >5.0    Signed and Held   Signed and Held  CBC  Once,   R    Comments:  Call for hg <8.0    Signed and Held      Vitals/Pain Today's Vitals   04/27/18 0000 04/27/18 0018 04/27/18 0100 04/27/18 0111  BP: 132/80 132/80 (!) 144/87   Pulse: 70 78 80   Resp:  14    Temp:      TempSrc:      SpO2: 91% 98% 98%   PainSc:    2     Isolation Precautions No active isolations  Medications Medications  fentaNYL (SUBLIMAZE) injection 100 mcg (100 mcg Intravenous Given 04/26/18 2041)  sodium  chloride 0.9 % bolus 1,000 mL (0 mLs Intravenous Stopped 04/26/18 1848)  ondansetron (ZOFRAN) injection 4 mg (4 mg Intravenous Given 04/26/18 1750)  gadobenate dimeglumine (MULTIHANCE) injection 20 mL (20 mLs Intravenous Contrast Given 04/26/18 2242)  LORazepam (ATIVAN) injection 1 mg (1 mg Intravenous Given 04/26/18 2347)  fentaNYL (SUBLIMAZE) injection 100 mcg (100 mcg Intravenous Given  04/26/18 2349)    Mobility walks

## 2018-04-27 NOTE — Progress Notes (Signed)
[facility]   Patient admitted after midnight, see H&P for further details.  Patient seen and examined and chart reviewed.  28 year old male with history of Graves' disease, PSC, anxiety and significant depression presented to the emergency department complaining of epigastric pain for 3 days prior to admission.  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  ESR elevated and gallbladder sludge without acute cholecystitis.  CT also showed questionable inflamed appendix.  Surgery was consulted and felt that this is not acute appendicitis, GI was consulted and recommended trial of prednisone, PPI and considering HIDA scan.  If no improvement with prednisone considering EGD to rule out upper GI pathology.  For now continue pain control.  Advance to clear liquids and continue to monitor.  Chipper Oman, MD

## 2018-04-27 NOTE — Consult Note (Signed)
Reason for Consult:ab pain Referring Physician: Dr Henrene Dodge is an 27 y.o. male. HPI: 39 yom with multiple medical issues including PSC, Graves disease, anxiety, narcotic abuse asked to see for possible appendicitis by [facility].  He has 3 days of mostly upper abdominal pain with some chest pain. He has had bms but he states less flatus now.  He has had n/v.   Has been to er several times.  Has [place] 10 July that was really negative and lfts ok.  He has not been taking all meds due to cost.  He has undergone mr that shows some fluid and mild ileus. Ct follow up shows questionable enlarged appendix but this is limited study.  His wbc was 11.1, tb 1.3 on admission. These are normal today     Past Medical History:  Diagnosis Date  . GERD (gastroesophageal reflux disease)   . Graves disease 08/18/2015  . Hyperlipidemia   . Hypothyroid   . Palpitations   . Vitamin D deficiency     Past Surgical History:  Procedure Laterality Date  . ANTERIOR CRUCIATE LIGAMENT REPAIR  2012  . WISDOM TOOTH EXTRACTION      Family History  Problem Relation Age of Onset  . Diabetes Mother   . Colon cancer Maternal Grandmother   . Stomach cancer Maternal Grandmother   . Diabetes Maternal Grandmother   . Stomach cancer Maternal Grandfather   . Diabetes Maternal Grandfather     Social History:  reports that he has been smoking.  He has never used smokeless tobacco. He reports that he has current or past drug history. Drug: Marijuana. He reports that he does not drink alcohol.  Allergies:  Allergies  Allergen Reactions  . Tylenol [Acetaminophen]     r/t elevated LFTs    Medications: I have reviewed the patient's current medications.  Results for orders placed or performed during the hospital encounter of 04/26/18 (from the past 48 hour(s))  Comprehensive metabolic panel     Status: Abnormal   Collection Time: 04/26/18  6:43 PM  Result Value Ref Range   Sodium 138 135 - 145 mmol/L   Potassium  3.5 3.5 - 5.1 mmol/L   Chloride 101 98 - 111 mmol/L    Comment: Please note change in reference range.   CO2 26 22 - 32 mmol/L   Glucose, Bld 117 (H) 70 - 99 mg/dL    Comment: Please note change in reference range.   BUN 12 6 - 20 mg/dL    Comment: Please note change in reference range.   Creatinine, Ser 1.00 0.61 - 1.24 mg/dL   Calcium 9.3 8.9 - 10.3 mg/dL   Total Protein 8.0 6.5 - 8.1 g/dL   Albumin 4.2 3.5 - 5.0 g/dL   AST 20 15 - 41 U/L   ALT 14 0 - 44 U/L    Comment: Please note change in reference range.   Alkaline Phosphatase 64 38 - 126 U/L   Total Bilirubin 1.1 0.3 - 1.2 mg/dL   GFR calc non Af Amer >60 >60 mL/min   GFR calc Af Amer >60 >60 mL/min    Comment: (NOTE) The eGFR has been calculated using the CKD EPI equation. This calculation has not been validated in all clinical situations. eGFR's persistently <60 mL/min signify possible Chronic Kidney Disease.    Anion gap 11 5 - 15    Comment: Performed at [facility], [place] [address] [place], [place] [address]  Ethanol  Status: None   Collection Time: 04/26/18  6:43 PM  Result Value Ref Range   Alcohol, Ethyl (B) <10 <10 mg/dL    Comment: (NOTE) Lowest detectable limit for serum alcohol is 10 mg/dL. For medical purposes only. Performed at [facility], [place] [address] [place], [place] [address]   Lipase, blood     Status: None   Collection Time: 04/26/18  6:43 PM  Result Value Ref Range   Lipase 20 11 - 51 U/L    Comment: Performed at [facility], [place] [address], [place], [place] [address]  CBC with Differential     Status: Abnormal   Collection Time: 04/26/18  6:43 PM  Result Value Ref Range   WBC 9.0 4.0 - 10.5 K/uL    Comment: WHITE COUNT CONFIRMED ON SMEAR   RBC 4.74 4.22 - 5.81 MIL/uL   Hemoglobin 14.2 13.0 - 17.0 g/dL   HCT 41.7 39.0 - 52.0 %   MCV 88.0 78.0 - 100.0 fL   MCH 30.0 26.0 - 34.0 pg   MCHC 34.1 30.0 - 36.0 g/dL   RDW 14.0  11.5 - 15.5 %   Platelets 61 (L) 150 - 400 K/uL    Comment: REPEATED TO VERIFY SPECIMEN CHECKED FOR CLOTS PLATELET COUNT CONFIRMED BY SMEAR DELTA CHECK NOTED    Neutrophils Relative % 92 %   Lymphocytes Relative 4 %   Monocytes Relative 4 %   Eosinophils Relative 0 %   Basophils Relative 0 %   Neutro Abs 8.2 (H) 1.7 - 7.7 K/uL   Lymphs Abs 0.4 (L) 0.7 - 4.0 K/uL   Monocytes Absolute 0.4 0.1 - 1.0 K/uL   Eosinophils Absolute 0.0 0.0 - 0.7 K/uL   Basophils Absolute 0.0 0.0 - 0.1 K/uL   RBC Morphology BURR CELLS    WBC Morphology VACUOLATED NEUTROPHILS     Comment: Performed at [facility], [place] [address], [place], [place] [address]  Urine rapid drug screen (hosp performed)     Status: Abnormal   Collection Time: 04/26/18  8:03 PM  Result Value Ref Range   Opiates NONE DETECTED NONE DETECTED   Cocaine NONE DETECTED NONE DETECTED   Benzodiazepines NONE DETECTED NONE DETECTED   Amphetamines NONE DETECTED NONE DETECTED   Tetrahydrocannabinol POSITIVE (A) NONE DETECTED   Barbiturates (A) NONE DETECTED    Result not available. Reagent lot number recalled by manufacturer.    Comment: Performed at [facility], [place] [address] [place], [place] [address]  Urinalysis, Routine w reflex microscopic     Status: Abnormal   Collection Time: 04/26/18  8:03 PM  Result Value Ref Range   Color, Urine YELLOW YELLOW   APPearance CLEAR CLEAR   Specific Gravity, Urine 1.025 1.005 - 1.030   pH 5.0 5.0 - 8.0   Glucose, UA NEGATIVE NEGATIVE mg/dL   Hgb urine dipstick MODERATE (A) NEGATIVE   Bilirubin Urine NEGATIVE NEGATIVE   Ketones, ur 20 (A) NEGATIVE mg/dL   Protein, ur 30 (A) NEGATIVE mg/dL   Nitrite NEGATIVE NEGATIVE   Leukocytes, UA NEGATIVE NEGATIVE   RBC / HPF 11-20 0 - 5 RBC/hpf   WBC, UA 0-5 0 - 5 WBC/hpf   Bacteria, UA NONE SEEN NONE SEEN   Mucus PRESENT     Comment: Performed at [facility], [place] [address] [place],  [place] [address]  Troponin I     Status: None   Collection Time: 04/27/18  1:20 AM  Result Value Ref Range   Troponin I <0.03 <0.03 ng/mL    Comment: Performed at [facility], [place] [address] [place], [place] [address]  Lactic acid, plasma     Status: None   Collection Time: 04/27/18  1:20 AM  Result Value Ref Range   Lactic Acid, Venous 1.0 0.5 - 1.9 mmol/L    Comment: Performed at [facility], [place] [address] [place], [place] [address]  Sedimentation rate     Status: Abnormal   Collection Time: 04/27/18  1:20 AM  Result Value Ref Range   Sed Rate 70 (H) 0 - 16 mm/hr    Comment: Performed at [facility], [place] [address], [place], [place] [address]  Magnesium     Status: None   Collection Time: 04/27/18  5:22 AM  Result Value Ref Range   Magnesium 2.0 1.7 - 2.4 mg/dL    Comment: Performed at [facility], [place] [address] [place] [address], [place], [place] [address]  Phosphorus     Status: Abnormal   Collection Time: 04/27/18  5:22 AM  Result Value Ref Range   Phosphorus 2.2 (L) 2.5 - 4.6 mg/dL    Comment: Performed at [facility], [place] [address] [place], [place] [address]  TSH     Status: Abnormal   Collection Time: 04/27/18  5:22 AM  Result Value Ref Range   TSH 5.591 (H) 0.350 - 4.500 uIU/mL    Comment: Performed by a 3rd Generation assay with a functional sensitivity of <=0.01 uIU/mL. Performed at [facility], [place] [address], [place], [place] [address]   Comprehensive metabolic panel     Status: Abnormal   Collection Time: 04/27/18  5:22 AM  Result Value Ref Range   Sodium 139 135 - 145 mmol/L   Potassium 3.5 3.5 - 5.1 mmol/L   Chloride 101 98 - 111 mmol/L    Comment: Please note change in reference range.   CO2 28 22 - 32 mmol/L   Glucose, Bld 110 (H) 70 - 99 mg/dL    Comment: Please note change in reference range.   BUN 11 6 - 20 mg/dL    Comment: Please note change in  reference range.   Creatinine, Ser 1.00 0.61 - 1.24 mg/dL   Calcium 9.3 8.9 - 10.3 mg/dL   Total Protein 7.4 6.5 - 8.1 g/dL   Albumin 3.6 3.5 - 5.0 g/dL   AST 15 15 - 41 U/L   ALT 12 0 - 44 U/L    Comment: Please note change in reference range.   Alkaline Phosphatase 62 38 - 126 U/L   Total Bilirubin 1.0 0.3 - 1.2 mg/dL   GFR calc non Af Amer >60 >60 mL/min   GFR calc Af Amer >60 >60 mL/min    Comment: (NOTE) The eGFR has been calculated using the CKD EPI equation. This calculation has not been validated in all clinical situations. eGFR's persistently <60 mL/min signify possible Chronic Kidney Disease.    Anion gap 10 5 - 15    Comment: Performed at [facility], [place] [address], [place], [place] [address]  CBC     Status: Abnormal   Collection Time: 04/27/18  5:22 AM  Result Value Ref Range   WBC 5.8 4.0 - 10.5 K/uL   RBC 4.33 4.22 - 5.81 MIL/uL   Hemoglobin 12.8 (L) 13.0 - 17.0 g/dL   HCT 38.6 (L) 39.0 - 52.0 %   MCV  89.1 78.0 - 100.0 fL   MCH 29.6 26.0 - 34.0 pg   MCHC 33.2 30.0 - 36.0 g/dL   RDW 14.1 11.5 - 15.5 %   Platelets 67 (L) 150 - 400 K/uL    Comment: CONSISTENT WITH PREVIOUS RESULT Performed at [facility], [place] [address], [place], [place] [address]     Mr Abdomen W Or Wo Contrast  Result Date: 04/26/2018 CLINICAL DATA:  Followup cholecystitis and cholangitis. History of primary sclerosing cholangitis. EXAM: MRI ABDOMEN WITHOUT AND WITH CONTRAST TECHNIQUE: Multiplanar multisequence MR imaging of the abdomen was performed both before and after the administration of intravenous contrast. CONTRAST:  43m MULTIHANCE GADOBENATE DIMEGLUMINE 529 MG/ML IV SOLN COMPARISON:  Ultrasound 04/24/2018.  MRI 11/20/2017 and 02/13/2017. FINDINGS: Lower chest:  The visualized lower chest appears unremarkable. Hepatobiliary: Typical hemangiomas are again noted within the right hepatic lobe, measuring 2.0 cm on image 15/5 and 13 mm on image 25/5.  These demonstrate typical enhancement. There are no suspicious hepatic findings. There is stable mild intrahepatic biliary dilatation and irregularity consistent with sclerosing cholangitis. There is dependent sludge within the gallbladder. No evidence of choledocholithiasis or extrahepatic biliary dilatation. 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 Pancreas: Unremarkable. No pancreatic ductal dilatation or surrounding inflammatory changes. Spleen: Normal in size without focal abnormality. Adrenals/Urinary Tract: Both adrenal glands appear normal. The kidneys appear normal without evidence of urinary tract calculus or hydronephrosis. Bladder not imaged. Stomach/Bowel: The stomach appears decompressed. There are multiple mildly dilated loops of small bowel in the mid abdomen. The visualized colon appears normal in caliber. Vascular/Lymphatic: There are no enlarged abdominal lymph nodes. No acute vascular findings are seen. 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 Other: There is a small amount of ascites which appears new. No focal extraluminal fluid collection seen. Musculoskeletal: No acute or significant osseous findings. IMPRESSION: 1. Mild small bowel dilatation and ascites. Findings could be secondary to ileus and peritonitis. Radiographic follow up recommended to exclude early obstruction. 2. Stable appearance of the biliary system consistent with sclerosing cholangitis. No significant biliary dilatation or evidence of choledocholithiasis. Mild gallbladder sludge. 3. 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 4. Stable hepatic hemangiomas. Electronically Signed   By: WRichardean SaleM.D.   On: 04/26/2018 23:15   Ct Abdomen Pelvis W Contrast  Result Date: 04/27/2018 CLINICAL DATA:  Abdominal pain with chills but no fever for 3 days. Bowel dilatation and ascites on MRCP. History of sclerosing cholangitis. EXAM: CT ABDOMEN AND PELVIS WITH CONTRAST  TECHNIQUE: Multidetector CT imaging of the abdomen and pelvis was performed using the standard protocol following bolus administration of intravenous contrast. CONTRAST:  1042mISOVUE-300 IOPAMIDOL (ISOVUE-300) INJECTION 61% COMPARISON:  MRI 04/26/2018 and 11/20/2017. FINDINGS: Lower chest: Mild linear atelectasis at both lung bases. No significant pleural or pericardial effusion. Hepatobiliary: There are known hemangiomas within the right hepatic lobe which are stable. There is stable mild intrahepatic biliary dilatation and irregularity consistent with sclerosing cholangitis. Dependent high-density sludge is present in the gallbladder lumen. There is no extrahepatic biliary dilatation. Pancreas: Unremarkable. No pancreatic ductal dilatation or surrounding inflammatory changes. Spleen: Normal in size without focal abnormality. Adrenals/Urinary Tract: Both adrenal glands appear normal. Both kidneys appear normal. No evidence of urinary tract calculus or hydronephrosis. There is contrast material within the bladder. No evidence of bladder wall thickening. Stomach/Bowel: The initial portal phase images are motion degraded (due to patient vomiting) and no enteric contrast was administered. Delayed imaging was  performed through the pelvis. No significant small or large bowel wall thickening, distention or surrounding inflammation. Because of these limitations, confident identification of the appendix is limited, although there is an apparent blind ending tubular structure extending into the midline pelvis which measures up to 11 mm in diameter on image 65/4. This is also seen on coronal images 59 and 60 of series 9. Vascular/Lymphatic: There are no enlarged abdominal or pelvic lymph nodes. No acute vascular findings. 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 Reproductive: The prostate gland and seminal vesicles appear normal. Other: Small amount of ascites, predominately in the pelvis and subhepatic space. No  focal extraluminal fluid collection. There is mild mesenteric edema. Musculoskeletal: No acute or significant osseous findings. IMPRESSION: 1. Study is limited by motion and the lack of enteric contrast. There is possible inflammation of the appendix in the low pelvis, and acute appendicitis cannot be excluded. Surgical evaluation recommended. 2. There is mild ascites and nonspecific mesenteric edema. No focal extraluminal fluid collection or bowel obstruction identified. 3. Stable appearance of the liver with mild intrahepatic biliary dilatation and irregularity attributed to sclerosing cholangitis. 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 4. These results were called by telephone at the time of interpretation on 04/27/2018 at 3:02 am to the patient's nurse Corliss Blacker, who verbally acknowledged these results. Electronically Signed   By: Richardean Sale M.D.   On: 04/27/2018 03:02    Review of Systems  Constitutional: Positive for chills.  Cardiovascular: Positive for chest pain.  Gastrointestinal: Positive for abdominal pain, constipation, nausea and vomiting.  All other systems reviewed and are negative.  Blood pressure (!) 143/88, pulse 72, temperature 98.3 F (36.8 C), resp. rate 20, height 6' 3" (1.905 m), weight 100.4 kg (221 lb 5.5 oz), SpO2 96 %. Physical Exam cv rr Lungs clear  abd mildly tender diffusely, no peritoneal signs, bs present  Assessment/Plan: Abd pain  Not sure of source, do not think he has appendicitis by exam/review of imaging or by history I do not think he needs surgery Will recheck abd film in am due to question of mild ileus, not sure of what fluid is from right now GI consult might be reasonable at some point as well due to underlying [place] and pain May need mrcp or hida at some point- he does have sludge stones Will follow with you  Rolm Bookbinder 04/27/2018, 10:01 AM

## 2018-04-27 NOTE — H&P (Addendum)
Kyle Gonzales WLS:[id number] DOB: [birth year] DOA: 04/26/2018     PCP: [name], MD   Outpatient Specialists:   GI Dr Norman Clay (Psychiatry)  Patient arrived to ER on 04/26/18 at 1524  Patient coming from:   home Lives   With family    Chief Complaint:  Chief Complaint  Patient presents with  . Abdominal Pain    HPI: Kyle Gonzales is a 27 y.o. male with medical history significant of narcotic abuse, esophagitis, HTN, medical noncompliance the prediabetes,  GERD and recurrent dyspepsia     Presented with  Severe abdominal/chest pain epigastric pain for the past 3 days. First felt like esophagitis. But now feels different feels like severe abdominal pain worse with ambulation associated with chills no fever, normal BM yesterday. This feels different from prior.  Pain is worse with movement felt like reflux esophagitis he seen his PCP today who recommended continuation of omeprazole Carafate given prescription for Zofran he denies any current nausea or diarrhea no fevers or chills has recurrent abdominal pain similar to prior and has been followed by GI otherwise no shortness of breath no cough. Has been seen in the ER for significant dyspepsia on 26 June was given prescription for Carafate and Pepcid to be taken in addition to his omeprazole told to stop NSAIDs and follow-up with GI.  He came back on 10 July reporting epigastric pain waxing and waning severity more constant some associated nausea but nonbilious nonbloody. At that time LFTs were reassuring normal lipase ultrasound of abdomen was unremarkable electrolytes within normal limits was managed symptomatically in the ER with improvement of his pain.  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  Last MRCP was in February 2019  has  history of  Graves' Dz treated by [id number] in Nov 2016 and begun on thyroid replacement in Feb 2017 Chronic anxiety on benzodiazepines Upper lipidemia not on statin secondary to underlying abnormal liver functions While in ER: Abdominal pain uncontrolled despite 100 of fentanyl x2 Patient continues to have significant abdominal pain MRI of the abdomen was done showed mild small bowel dilatation and ascites Could not rule out ileus or peritonitis due to findings of mild ascites After MRI was done the pain has resolved spontaneously but then he developed right clavicle pain. Abdominal ultrasound showing gallbladder sludge chronic cavernous transformation of portal vein and chronic biliary diet dilatation secondary to known sclerosing cholangitis Patient states he cannot go home and wanted to be admitted.  Following Medications were ordered in ER: Medications  fentaNYL (SUBLIMAZE) injection 100 mcg (100 mcg Intravenous Given 04/26/18 2041)  sodium chloride 0.9 % bolus 1,000 mL (0 mLs Intravenous Stopped 04/26/18 1848)  ondansetron (ZOFRAN) injection 4 mg (4 mg Intravenous Given 04/26/18 1750)  gadobenate dimeglumine (MULTIHANCE) injection 20 mL (20 mLs Intravenous Contrast Given 04/26/18 2242)  LORazepam (ATIVAN) injection 1 mg (1 mg Intravenous Given 04/26/18 2347)  fentaNYL (SUBLIMAZE) injection 100 mcg (100 mcg Intravenous Given 04/26/18 2349)    Significant initial  Findings: Abnormal Labs Reviewed  COMPREHENSIVE METABOLIC PANEL - Abnormal; Notable for the following components:      Result Value   Glucose, Bld 117 (*)    All other components within normal limits  CBC WITH DIFFERENTIAL/PLATELET - Abnormal; Notable for the following components:   Platelets 61 (*)    Neutro Abs  8.2 (*)    Lymphs Abs 0.4 (*)    All other components within normal limits  RAPID URINE DRUG SCREEN, HOSP PERFORMED - Abnormal; Notable for the following components:   Tetrahydrocannabinol POSITIVE (*)     Barbiturates   (*)    Value: Result not available. Reagent lot number recalled by manufacturer.   All other components within normal limits  URINALYSIS, ROUTINE W REFLEX MICROSCOPIC - Abnormal; Notable for the following components:   Hgb urine dipstick MODERATE (*)    Ketones, ur 20 (*)    Protein, ur 30 (*)    All other components within normal limits     Na 138 K 3.5  Cr   stable,   Lab Results  Component Value Date   CREATININE 1.00 04/26/2018   CREATININE 1.10 04/26/2018   CREATININE 1.16 04/24/2018      WBC  9.0  HG/HCT   stable,       Component Value Date/Time   HGB 14.2 04/26/2018 1843   HCT 41.7 04/26/2018 1843   plt 61 down from prior    Lactic Acid, Venous No results found for: LATICACIDVEN  Lipase 20    UA   no evidence of UTI        MRI abd  -  Mild small bowel dilatation and ascites nonspecific could be secondary to ileus and peritonitis  ECG:  Not obtained    ED Triage Vitals  Enc Vitals Group     BP 04/26/18 1536 120/81     Pulse Rate 04/26/18 1536 85     Resp 04/26/18 1536 18     Temp 04/26/18 1536 98.2 F (36.8 C)     Temp Source 04/26/18 1536 Oral     SpO2 04/26/18 1536 100 %     Weight --      Height --      Head Circumference --      Peak Flow --      Pain Score 04/26/18 1543 10     Pain Loc --      Pain Edu? --      Excl. in [place]? --   TMAX(24)@       Latest  Blood pressure 132/80, pulse 70, temperature 98.2 F (36.8 C), temperature source Oral, resp. rate 16, SpO2 91 %.      Hospitalist was called for admission for abdominal pain unclear etiology difficult to control loss of appetite,    Review of Systems:    Pertinent positives include: fatigue, abdominal pain, nausea,  Constitutional:  No weight loss, night sweats, Fevers, chills, weight loss  HEENT:  No headaches, Difficulty swallowing,Tooth/dental problems,Sore throat,  No sneezing, itching, ear ache, nasal congestion, post nasal drip,  Cardio-vascular:  No  chest pain, Orthopnea, PND, anasarca, dizziness, palpitations.no Bilateral lower extremity swelling  GI:  No heartburn, indigestion,  vomiting, diarrhea, change in bowel habits,melena, blood in stool, hematemesis Resp:  no shortness of breath at rest. No dyspnea on exertion, No excess mucus, no productive cough, No non-productive cough, No coughing up of blood.No change in color of mucus.No wheezing. Skin:  no rash or lesions. No jaundice GU:  no dysuria, change in color of urine, no urgency or frequency. No straining to urinate.  No flank pain.  Musculoskeletal:  No joint pain or no joint swelling. No decreased range of motion. No back pain.  Psych:  No change in mood or affect. No depression or anxiety. No memory loss.  Neuro: no localizing neurological  complaints, no tingling, no weakness, no double vision, no gait abnormality, no slurred speech, no confusion  All systems reviewed and apart from [place] all are negative  Past Medical History:   Past Medical History:  Diagnosis Date  . GERD (gastroesophageal reflux disease)   . Graves disease 08/18/2015  . Hyperlipidemia   . Hypothyroid   . Palpitations   . Vitamin D deficiency       Past Surgical History:  Procedure Laterality Date  . ANTERIOR CRUCIATE LIGAMENT REPAIR  2012  . WISDOM TOOTH EXTRACTION      Social History:  Ambulatory  independently      reports that he has been smoking.  He has never used smokeless tobacco. He reports that he has current or past drug history. Drug: Marijuana. He reports that he does not drink alcohol.     Family History:   Family History  Problem Relation Age of Onset  . Diabetes Mother   . Colon cancer Maternal Grandmother   . Stomach cancer Maternal Grandmother   . Diabetes Maternal Grandmother   . Stomach cancer Maternal Grandfather   . Diabetes Maternal Grandfather     Allergies: Allergies  Allergen Reactions  . Tylenol [Acetaminophen]     r/t elevated LFTs     Prior  to Admission medications   Medication Sig Start Date End Date Taking? Authorizing Provider  bisoprolol-hydrochlorothiazide (ZIAC) 10-6.25 MG tablet Take 1 tablet by mouth daily. 01/22/18  Yes Liane Comber, NP  Cholecalciferol (VITAMIN D3) 5000 units CAPS Take 10,000 Units by mouth daily.   Yes [provider]  ezetimibe (ZETIA) 10 MG tablet Take 1 tablet daily for Cholesterol 02/18/18  Yes [name], MD  famotidine (PEPCID) 20 MG tablet Take 1 tablet (20 mg total) by mouth 2 (two) times daily. 04/10/18  Yes Charlesetta Shanks, MD  hydrOXYzine (ATARAX/VISTARIL) 25 MG tablet Take 1 tablet (25 mg total) by mouth 3 (three) times daily as needed for anxiety. 03/01/18  Yes Starkes, Gayland Curry, FNP  levothyroxine (SYNTHROID, LEVOTHROID) 75 MCG tablet take 1 tablet by mouth every morning ON AN EMPTY STOMACH 02/08/18  Yes [name], MD  metoCLOPramide (REGLAN) 10 MG tablet Take 1 tablet 4 x/day before meals and bedtime for Nausea & bloating 04/26/18  Yes [name], MD  omeprazole (PRILOSEC) 40 MG capsule Take 1 capsule (40 mg total) by mouth daily. Please schedule an Office visit for future refills: [phone] 04/01/18  Yes Armbruster, Carlota Raspberry, MD  ondansetron [facility]) 8 MG tablet Take 1 tablet 3 x/ day before meals - Nausea 04/26/18  Yes [name], MD  sertraline (ZOLOFT) 100 MG tablet Take 1 tablet (100 mg total) by mouth at bedtime. 04/01/18  Yes Hisada, Elie Goody, MD  sucralfate (CARAFATE) 1 g tablet Take 1 tablet (1 g total) by mouth 2 (two) times daily. 04/25/18  Yes Antonietta Breach, PA-C  traZODone (DESYREL) 50 MG tablet Take 1 tablet (50 mg total) by mouth at bedtime as needed for sleep. 03/01/18  Yes Starkes, Gayland Curry, FNP  diazepam (VALIUM) 10 MG tablet Take 1 tablet (10 mg total) by mouth every 6 (six) hours as needed (Muscle spasm). 04/26/18   Daleen Bo, MD  oxyCODONE (OXY IR/ROXICODONE) 5 MG immediate release tablet Take 1 tablet (5 mg total) by mouth every 4 (four) hours as  needed for severe pain. 04/26/18   Daleen Bo, MD   Physical Exam: Blood pressure 132/80, pulse 70, temperature 98.2 F (36.8 C), temperature source Oral, resp. rate 16,  SpO2 91 %. 1. General:  in No Acute distress  acutely ill -appearing 2. Psychological: Alert and  Oriented 3. Head/ENT:    Dry Mucous Membranes                          Head Non traumatic, neck supple                           Poor Dentition 4. SKIN:   decreased Skin turgor,  Skin clean Dry and intact no rash 5. Heart: Regular rate and rhythm no Murmur, no Rub or gallop 6. Lungs: Clear to auscultation bilaterally, no wheezes or crackles   7. Abdomen: Soft, diffusely tender, slightly  Distended diminished bowel sounds no rebound or referred pain.  Some guarding present 8. Lower extremities: no clubbing, cyanosis, or  edema 9. Neurologically Grossly intact, moving all 4 extremities equally   10. MSK: Normal range of motion   LABS:     Recent Labs  Lab 04/24/18 2137 04/26/18 1125 04/26/18 1843  WBC 13.4* 11.2* 9.0  NEUTROABS  --  10,170* 8.2*  HGB 14.7 14.7 14.2  HCT 42.6 42.3 41.7  MCV 87.5 84.3 88.0  PLT 202 89* 61*   Basic Metabolic Panel: Recent Labs  Lab 04/24/18 2137 04/26/18 1125 04/26/18 1843  NA 139 138 138  K 3.2* 3.8 3.5  CL 102 98 101  CO2 20* 30 26  GLUCOSE 138* 114* 117*  BUN 14 11 12   CREATININE 1.16 1.10 1.00  CALCIUM 9.9 10.0 9.3  MG  --  2.0  --       Recent Labs  Lab 04/24/18 2137 04/26/18 1125 04/26/18 1843  AST 22 17 20   ALT 17 12 14   ALKPHOS 80  --  64  BILITOT 1.1 1.3* 1.1  PROT 8.7* 7.8 8.0  ALBUMIN 4.9  --  4.2   Recent Labs  Lab 04/24/18 2137 04/26/18 1843  LIPASE 25 20   No results for input(s): AMMONIA in the last 168 hours.    HbA1C: No results for input(s): HGBA1C in the last 72 hours. CBG: No results for input(s): GLUCAP in the last 168 hours.    Urine analysis:    Component Value Date/Time   COLORURINE YELLOW 04/26/2018 2003    APPEARANCEUR CLEAR 04/26/2018 2003   LABSPEC 1.025 04/26/2018 2003   PHURINE 5.0 04/26/2018 2003   GLUCOSEU NEGATIVE 04/26/2018 2003   HGBUR MODERATE (A) 04/26/2018 2003   BILIRUBINUR NEGATIVE 04/26/2018 2003   KETONESUR 20 (A) 04/26/2018 2003   PROTEINUR 30 (A) 04/26/2018 2003   NITRITE NEGATIVE 04/26/2018 2003   LEUKOCYTESUR NEGATIVE 04/26/2018 2003       Cultures: No results found for: SDES, SPECREQUEST, CULT, REPTSTATUS   Radiological Exams on Admission: Mr Abdomen W Or Wo Contrast  Result Date: 04/26/2018 CLINICAL DATA:  Followup cholecystitis and cholangitis. History of primary sclerosing cholangitis. EXAM: MRI ABDOMEN WITHOUT AND WITH CONTRAST TECHNIQUE: Multiplanar multisequence MR imaging of the abdomen was performed both before and after the administration of intravenous contrast. CONTRAST:  23mL MULTIHANCE GADOBENATE DIMEGLUMINE 529 MG/ML IV SOLN COMPARISON:  Ultrasound 04/24/2018.  MRI 11/20/2017 and 02/13/2017. FINDINGS: Lower chest:  The visualized lower chest appears unremarkable. Hepatobiliary: Typical hemangiomas are again noted within the right hepatic lobe, measuring 2.0 cm on image 15/5 and 13 mm on image 25/5. These demonstrate typical enhancement. There are no suspicious hepatic findings. There is stable mild  intrahepatic biliary dilatation and irregularity consistent with sclerosing cholangitis. There is dependent sludge within the gallbladder. No evidence of choledocholithiasis or extrahepatic biliary dilatation. 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 Pancreas: Unremarkable. No pancreatic ductal dilatation or surrounding inflammatory changes. Spleen: Normal in size without focal abnormality. Adrenals/Urinary Tract: Both adrenal glands appear normal. The kidneys appear normal without evidence of urinary tract calculus or hydronephrosis. Bladder not imaged. Stomach/Bowel: The stomach appears decompressed. There are multiple mildly dilated loops of  small bowel in the mid abdomen. The visualized colon appears normal in caliber. Vascular/Lymphatic: There are no enlarged abdominal lymph nodes. No acute vascular findings are seen. 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 Other: There is a small amount of ascites which appears new. No focal extraluminal fluid collection seen. Musculoskeletal: No acute or significant osseous findings. IMPRESSION: 1. Mild small bowel dilatation and ascites. Findings could be secondary to ileus and peritonitis. Radiographic follow up recommended to exclude early obstruction. 2. Stable appearance of the biliary system consistent with sclerosing cholangitis. No significant biliary dilatation or evidence of choledocholithiasis. Mild gallbladder sludge. 3. 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 4. Stable hepatic hemangiomas. Electronically Signed   By: Richardean Sale M.D.   On: 04/26/2018 23:15   US Abdomen Complete  Result Date: 04/25/2018 CLINICAL DATA:  Abdominal pain today. History of primary sclerosing cholangitis. EXAM: ABDOMEN ULTRASOUND COMPLETE COMPARISON:  MRI of the liver November 20, 2017. FINDINGS: Gallbladder: Layering echogenic gallbladder sludge without discrete gallstone. No pericholecystic fluid. No gallbladder wall thickening. No sonographic Murphy sign elicited. Common bile duct: Diameter: 4 mm. Liver: Echogenic hepatic lesions measuring to 2.7 cm previously characterized as hemangiomas. Mildly prominent intrahepatic biliary dilatation, unchanged. Within normal limits in parenchymal echogenicity. Portal vein is patent on color Doppler imaging with normal direction of blood flow towards the liver. 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 Portal vein is not discretely identified. IVC: No abnormality visualized. Pancreas: Visualized portion unremarkable. Spleen: Size and appearance within normal limits. Right Kidney: Length: 12 cm. Echogenicity within normal limits. No mass or  hydronephrosis visualized. Left Kidney: Length: 12.1 cm. Echogenicity within normal limits. No mass or hydronephrosis visualized. Abdominal aorta: No aneurysm visualized. Other findings: None. IMPRESSION: 1. Gallbladder sludge without sonographic findings of acute cholecystitis. 2. Chronic cavernous transformation of portal vein. 3. Chronic biliary duct dilatation consistent with known sclerosing cholangitis. Electronically Signed   By: Elon Alas M.D.   On: 04/25/2018 00:39    Chart has been reviewed    Assessment/Plan  27 y.o. male with medical history significant of narcotic abuse, esophagitis, HTN, medical noncompliance the prediabetes,  GERD and recurrent dyspepsia   Admitted for abdominal pain unclear etiology difficult to control loss of appetite,    Present on Admission: . Abdominal pain -obtain CT of abdomen given significant abdominal pain with questionable peritoneal signs and abnormal MRI.  Discussed with radiologist recommends obtaining CT with contrast to evaluate this further. At this point no evidence of infectious process but will await results of further imaging if any surgical indication will need surgery consult for now keep n.p.o. rehydrate  . Hyperlipidemia, mixed stable patient not on statin secondary to history of LFT elevations . Hypertension currently stable will keep n.p.o. until CT scan obtain . Hypothyroidism stable restart Synthroid when able to tolerate p.o. . MDD (major depressive disorder), recurrent severe, without psychosis ([place]) -home medications when able to tolerate p.o.  . Drug addiction / [employer] [facility]) speak about importance of quitting  Could explain some nausea and vomiting symptoms  Thrombocytopenia -worsening from baseline but has been persistent.  Will evaluate for splenomegaly no elevated bilirubin to suggest hemolysis but check haptoglobin, no evidence of schistocytes, check coagulation and fibrinogen Other plan as per orders.  DVT  prophylaxis:  SCD      Code Status:  FULL CODE   Family Communication:   Family   at  Bedside  plan of care was discussed with   mother  Disposition Plan:     To home once workup is complete and patient is stable    Consults called: none  Admission status:   obs   Level of care      medical floor        Perlita Forbush 04/27/2018, 1:21 AM    [facility]  Pager [phone]   after 2 AM please page floor coverage PA If 7AM-7PM, please contact the day team taking care of the patient  [email or web address]  Password [email or web address]

## 2018-04-28 ENCOUNTER — Observation Stay (HOSPITAL_COMMUNITY): Payer: 59

## 2018-04-28 DIAGNOSIS — K210 Gastro-esophageal reflux disease with esophagitis: Secondary | ICD-10-CM | POA: Diagnosis present

## 2018-04-28 DIAGNOSIS — E0500 Thyrotoxicosis with diffuse goiter without thyrotoxic crisis or storm: Secondary | ICD-10-CM | POA: Diagnosis present

## 2018-04-28 DIAGNOSIS — R109 Unspecified abdominal pain: Secondary | ICD-10-CM | POA: Diagnosis present

## 2018-04-28 DIAGNOSIS — Z79899 Other long term (current) drug therapy: Secondary | ICD-10-CM | POA: Diagnosis not present

## 2018-04-28 DIAGNOSIS — D696 Thrombocytopenia, unspecified: Secondary | ICD-10-CM | POA: Diagnosis present

## 2018-04-28 DIAGNOSIS — Z9114 Patient's other noncompliance with medication regimen: Secondary | ICD-10-CM | POA: Diagnosis not present

## 2018-04-28 DIAGNOSIS — Z79890 Hormone replacement therapy (postmenopausal): Secondary | ICD-10-CM | POA: Diagnosis not present

## 2018-04-28 DIAGNOSIS — F419 Anxiety disorder, unspecified: Secondary | ICD-10-CM | POA: Diagnosis present

## 2018-04-28 DIAGNOSIS — K567 Ileus, unspecified: Secondary | ICD-10-CM | POA: Diagnosis present

## 2018-04-28 DIAGNOSIS — E039 Hypothyroidism, unspecified: Secondary | ICD-10-CM | POA: Diagnosis present

## 2018-04-28 DIAGNOSIS — E876 Hypokalemia: Secondary | ICD-10-CM | POA: Diagnosis not present

## 2018-04-28 DIAGNOSIS — K828 Other specified diseases of gallbladder: Secondary | ICD-10-CM | POA: Diagnosis present

## 2018-04-28 DIAGNOSIS — R188 Other ascites: Secondary | ICD-10-CM | POA: Diagnosis present

## 2018-04-28 DIAGNOSIS — F332 Major depressive disorder, recurrent severe without psychotic features: Secondary | ICD-10-CM | POA: Diagnosis present

## 2018-04-28 DIAGNOSIS — R1084 Generalized abdominal pain: Secondary | ICD-10-CM | POA: Diagnosis not present

## 2018-04-28 DIAGNOSIS — I81 Portal vein thrombosis: Secondary | ICD-10-CM | POA: Diagnosis present

## 2018-04-28 DIAGNOSIS — I10 Essential (primary) hypertension: Secondary | ICD-10-CM | POA: Diagnosis present

## 2018-04-28 DIAGNOSIS — K8301 Primary sclerosing cholangitis: Secondary | ICD-10-CM | POA: Diagnosis present

## 2018-04-28 DIAGNOSIS — Z9119 Patient's noncompliance with other medical treatment and regimen: Secondary | ICD-10-CM | POA: Diagnosis not present

## 2018-04-28 DIAGNOSIS — F17210 Nicotine dependence, cigarettes, uncomplicated: Secondary | ICD-10-CM | POA: Diagnosis present

## 2018-04-28 DIAGNOSIS — E782 Mixed hyperlipidemia: Secondary | ICD-10-CM | POA: Diagnosis present

## 2018-04-28 DIAGNOSIS — F1120 Opioid dependence, uncomplicated: Secondary | ICD-10-CM | POA: Diagnosis present

## 2018-04-28 DIAGNOSIS — Z888 Allergy status to other drugs, medicaments and biological substances status: Secondary | ICD-10-CM | POA: Diagnosis not present

## 2018-04-28 LAB — CBC WITH DIFFERENTIAL/PLATELET
Basophils Absolute: 0 10*3/uL (ref 0.0–0.1)
Basophils Relative: 0 %
EOS ABS: 0 10*3/uL (ref 0.0–0.7)
EOS PCT: 0 %
HCT: 37.7 % — ABNORMAL LOW (ref 39.0–52.0)
Hemoglobin: 12.5 g/dL — ABNORMAL LOW (ref 13.0–17.0)
LYMPHS ABS: 0.5 10*3/uL — AB (ref 0.7–4.0)
LYMPHS PCT: 6 %
MCH: 29.2 pg (ref 26.0–34.0)
MCHC: 33.2 g/dL (ref 30.0–36.0)
MCV: 88.1 fL (ref 78.0–100.0)
MONO ABS: 0.5 10*3/uL (ref 0.1–1.0)
Monocytes Relative: 7 %
Neutro Abs: 6.3 10*3/uL (ref 1.7–7.7)
Neutrophils Relative %: 87 %
PLATELETS: 101 10*3/uL — AB (ref 150–400)
RBC: 4.28 MIL/uL (ref 4.22–5.81)
RDW: 14.1 % (ref 11.5–15.5)
WBC: 7.3 10*3/uL (ref 4.0–10.5)

## 2018-04-28 LAB — MAGNESIUM: MAGNESIUM: 2 mg/dL (ref 1.7–2.4)

## 2018-04-28 LAB — BASIC METABOLIC PANEL
Anion gap: 9 (ref 5–15)
BUN: 9 mg/dL (ref 6–20)
CO2: 29 mmol/L (ref 22–32)
CREATININE: 0.9 mg/dL (ref 0.61–1.24)
Calcium: 9.1 mg/dL (ref 8.9–10.3)
Chloride: 100 mmol/L (ref 98–111)
GFR calc Af Amer: >60 mL/min (ref >60)
GLUCOSE: 131 mg/dL — AB (ref 70–99)
POTASSIUM: 3.4 mmol/L — AB (ref 3.5–5.1)
Sodium: 138 mmol/L (ref 135–145)

## 2018-04-28 LAB — PHOSPHORUS: Phosphorus: 3.3 mg/dL (ref 2.5–4.6)

## 2018-04-28 LAB — HAPTOGLOBIN: Haptoglobin: 213 mg/dL — ABNORMAL HIGH (ref 34–200)

## 2018-04-28 MED ORDER — POTASSIUM CHLORIDE CRYS ER 20 MEQ PO TBCR
40.0000 meq | EXTENDED_RELEASE_TABLET | ORAL | Status: AC
  Administered 2018-04-28 (×2): 40 meq via ORAL
  Filled 2018-04-28 (×2): qty 2

## 2018-04-28 MED ORDER — FAMOTIDINE 20 MG PO TABS
20.0000 mg | ORAL_TABLET | Freq: Two times a day (BID) | ORAL | Status: DC
  Administered 2018-04-28 – 2018-04-29 (×3): 20 mg via ORAL
  Filled 2018-04-28 (×3): qty 1

## 2018-04-28 MED ORDER — FLUTICASONE PROPIONATE 50 MCG/ACT NA SUSP
1.0000 | Freq: Every day | NASAL | Status: DC
  Administered 2018-04-28 – 2018-05-02 (×5): 1 via NASAL
  Filled 2018-04-28: qty 16

## 2018-04-28 MED ORDER — BISOPROLOL-HYDROCHLOROTHIAZIDE 10-6.25 MG PO TABS
1.0000 | ORAL_TABLET | Freq: Every day | ORAL | Status: DC
  Administered 2018-04-28 – 2018-05-02 (×5): 1 via ORAL
  Filled 2018-04-28 (×5): qty 1

## 2018-04-28 MED ORDER — MORPHINE SULFATE (PF) 2 MG/ML IV SOLN
1.0000 mg | INTRAVENOUS | Status: DC | PRN
  Administered 2018-04-29 – 2018-05-02 (×3): 1 mg via INTRAVENOUS
  Filled 2018-04-28 (×3): qty 1

## 2018-04-28 MED ORDER — POLYETHYLENE GLYCOL 3350 17 G PO PACK
17.0000 g | PACK | Freq: Every day | ORAL | Status: DC
  Administered 2018-04-28: 17 g via ORAL
  Filled 2018-04-28 (×4): qty 1

## 2018-04-28 MED ORDER — KETOROLAC TROMETHAMINE 15 MG/ML IJ SOLN
15.0000 mg | Freq: Three times a day (TID) | INTRAMUSCULAR | Status: DC | PRN
  Administered 2018-04-28 – 2018-05-02 (×11): 15 mg via INTRAVENOUS
  Filled 2018-04-28 (×12): qty 1

## 2018-04-28 NOTE — Progress Notes (Signed)
Pt does not tolerate potasium PO. RN dissolved in applesauce and pt was unable to swallow due to nausea.

## 2018-04-28 NOTE — Progress Notes (Addendum)
[facility] Progress Note   Chief Complaint:   Abdominal pain    SUBJECTIVE:    feels like prednisone is helping. Mainly upper abdomen is "just sore" and wants heating pad. Tolerating liquids   ASSESSMENT AND PLAN:    52. 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 Liver chemistries are normal. Stable biliary duct dilation / cavernous transformation of PV on u/s, CT scan and MRI but his ESR is 70 for unclear reasons. Mesenteric adenitis? PSC? Started on prednisone yesterday. Feels a little better today.   2. Gallbladder sludge on imaging, assuming this is incidental finding at this point.   3. ? Inflamed appendix on imaging. Surgery following and doesn't feel he has appendicitis.   4. Ileus. Abdomen only mildly distended if at all. A few bowel sounds. No vomiting. -Follow for now. He is ambulating. Hopefully off narcotics soon -continue miralax   OBJECTIVE:     Vital signs in last 24 hours: Temp:  [98.2 F (36.8 C)-98.8 F (37.1 C)] 98.3 F (36.8 C) (07/14 0410) Pulse Rate:  [71-90] 89 (07/14 0410) Resp:  [18-20] 18 (07/14 0410) BP: (138-144)/(80-82) 140/80 (07/14 0410) SpO2:  [97 %-100 %] 100 % (07/14 0410) Last BM Date: 04/26/18 General:   Alert, well-developed,   in NAD EENT:  Normal hearing, non icteric sclera, conjunctive pink.  Heart:  Regular rate and rhythm; no murmurs. No lower extremity edema Pulm: Normal respiratory effort, lungs CTA bilaterally without wheezes or crackles. Abdomen:  Soft, minimally distended, nontender.  A few bowel sounds, no masses felt.     Neurologic:  Alert and  oriented x4;  grossly normal neurologically. Psych:  Pleasant, cooperative.  Normal mood and affect.   Intake/Output from previous day: 07/13 0701 - 07/14 0700 In: 183.4 [IV Piggyback:183.4] Out: 375 [Urine:375] Intake/Output this shift: No intake/output data  recorded.  Lab Results: Recent Labs    04/26/18 1843 04/27/18 0522 04/28/18 0549  WBC 9.0 5.8 7.3  HGB 14.2 12.8* 12.5*  HCT 41.7 38.6* 37.7*  PLT 61* 67* 101*   BMET Recent Labs    04/26/18 1843 04/27/18 0522 04/28/18 0549  NA 138 139 138  K 3.5 3.5 3.4*  CL 101 101 100  CO2 26 28 29   GLUCOSE 117* 110* 131*  BUN 12 11 9   CREATININE 1.00 1.00 0.90  CALCIUM 9.3 9.3 9.1   LFT Recent Labs    04/27/18 0522  PROT 7.4  ALBUMIN 3.6  AST 15  ALT 12  ALKPHOS 62  BILITOT 1.0   PT/INR No results for input(s): LABPROT, INR in the last 72 hours. Hepatitis Panel No results for input(s): HEPBSAG, HCVAB, HEPAIGM, HEPBIGM in the last 72 hours.  Mr Abdomen W Or Wo Contrast  Result Date: 04/26/2018 CLINICAL DATA:  Followup cholecystitis and cholangitis. History of primary sclerosing cholangitis. EXAM: MRI ABDOMEN WITHOUT AND WITH CONTRAST TECHNIQUE: Multiplanar multisequence MR imaging of the abdomen was performed both before and after the administration of intravenous contrast. CONTRAST:  72m MULTIHANCE GADOBENATE DIMEGLUMINE 529 MG/ML IV SOLN COMPARISON:  Ultrasound 04/24/2018.  MRI 11/20/2017 and 02/13/2017. FINDINGS: Lower chest:  The visualized lower chest appears unremarkable. Hepatobiliary: Typical hemangiomas are again noted within the right hepatic lobe, measuring 2.0 cm on image 15/5 and 13 mm on image 25/5. These demonstrate typical enhancement. There are no suspicious hepatic findings. There is stable mild intrahepatic biliary dilatation and irregularity consistent  with sclerosing cholangitis. There is dependent sludge within the gallbladder. No evidence of choledocholithiasis or extrahepatic biliary dilatation. 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 Pancreas: Unremarkable. No pancreatic ductal dilatation or surrounding inflammatory changes. Spleen: Normal in size without focal abnormality. Adrenals/Urinary Tract: Both adrenal glands appear normal.  The kidneys appear normal without evidence of urinary tract calculus or hydronephrosis. Bladder not imaged. Stomach/Bowel: The stomach appears decompressed. There are multiple mildly dilated loops of small bowel in the mid abdomen. The visualized colon appears normal in caliber. Vascular/Lymphatic: There are no enlarged abdominal lymph nodes. No acute vascular findings are seen. 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 Other: There is a small amount of ascites which appears new. No focal extraluminal fluid collection seen. Musculoskeletal: No acute or significant osseous findings. IMPRESSION: 1. Mild small bowel dilatation and ascites. Findings could be secondary to ileus and peritonitis. Radiographic follow up recommended to exclude early obstruction. 2. Stable appearance of the biliary system consistent with sclerosing cholangitis. No significant biliary dilatation or evidence of choledocholithiasis. Mild gallbladder sludge. 3. 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 4. Stable hepatic hemangiomas. Electronically Signed   By: Richardean Sale M.D.   On: 04/26/2018 23:15   Ct Abdomen Pelvis W Contrast  Result Date: 04/27/2018 CLINICAL DATA:  Abdominal pain with chills but no fever for 3 days. Bowel dilatation and ascites on MRCP. History of sclerosing cholangitis. EXAM: CT ABDOMEN AND PELVIS WITH CONTRAST TECHNIQUE: Multidetector CT imaging of the abdomen and pelvis was performed using the standard protocol following bolus administration of intravenous contrast. CONTRAST:  165m ISOVUE-300 IOPAMIDOL (ISOVUE-300) INJECTION 61% COMPARISON:  MRI 04/26/2018 and 11/20/2017. FINDINGS: Lower chest: Mild linear atelectasis at both lung bases. No significant pleural or pericardial effusion. Hepatobiliary: There are known hemangiomas within the right hepatic lobe which are stable. There is stable mild intrahepatic biliary dilatation and irregularity consistent with sclerosing  cholangitis. Dependent high-density sludge is present in the gallbladder lumen. There is no extrahepatic biliary dilatation. Pancreas: Unremarkable. No pancreatic ductal dilatation or surrounding inflammatory changes. Spleen: Normal in size without focal abnormality. Adrenals/Urinary Tract: Both adrenal glands appear normal. Both kidneys appear normal. No evidence of urinary tract calculus or hydronephrosis. There is contrast material within the bladder. No evidence of bladder wall thickening. Stomach/Bowel: The initial portal phase images are motion degraded (due to patient vomiting) and no enteric contrast was administered. Delayed imaging was performed through the pelvis. No significant small or large bowel wall thickening, distention or surrounding inflammation. Because of these limitations, confident identification of the appendix is limited, although there is an apparent blind ending tubular structure extending into the midline pelvis which measures up to 11 mm in diameter on image 65/4. This is also seen on coronal images 59 and 60 of series 9. Vascular/Lymphatic: There are no enlarged abdominal or pelvic lymph nodes. No acute vascular findings. 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 Reproductive: The prostate gland and seminal vesicles appear normal. Other: Small amount of ascites, predominately in the pelvis and subhepatic space. No focal extraluminal fluid collection. There is mild mesenteric edema. Musculoskeletal: No acute or significant osseous findings. IMPRESSION: 1. Study is limited by motion and the lack of enteric contrast. There is possible inflammation of the appendix in the low pelvis, and acute appendicitis cannot be excluded. Surgical evaluation recommended. 2. There is mild ascites and nonspecific mesenteric edema. No focal extraluminal fluid collection or bowel obstruction identified. 3. Stable appearance of the liver with mild  intrahepatic biliary dilatation and irregularity  attributed to sclerosing cholangitis. 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 4. These results were called by telephone at the time of interpretation on 04/27/2018 at 3:02 am to the patient's nurse Corliss Blacker, who verbally acknowledged these results. Electronically Signed   By: Richardean Sale M.D.   On: 04/27/2018 03:02   Dg Abd Portable 1v  Result Date: 04/28/2018 CLINICAL DATA:  Evaluate ileus. EXAM: PORTABLE ABDOMEN - 1 VIEW COMPARISON:  04/27/2018. FINDINGS: Diffuse gaseous distension of the large and small bowel loops are identified. This appears increased from previous exam with increase in diameter of small bowel loops. IMPRESSION: 1. Interval progression of ileus pattern. Electronically Signed   By: Kerby Moors M.D.   On: 04/28/2018 07:55    Principal Problem:   Abdominal pain Active Problems:   Hypothyroidism   Hyperlipidemia, mixed   Drug addiction / Marajuana ([place])   Non-compliance   MDD (major depressive disorder), recurrent severe, without psychosis ([place])   Hypertension   Primary sclerosing cholangitis     LOS: 0 days   Kyle Gonzales ,NP 04/28/2018, 10:25 AM     Attending physician's note   I have taken an interval history, reviewed the chart and examined the patient. I agree with the Advanced Practitioner's note, impression and recommendations.  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

## 2018-04-28 NOTE — Progress Notes (Signed)
PROGRESS NOTE [facility]   Kalep Full   WJX:[id number] DOB: [birth year]  DOA: 04/26/2018 PCP: [name], MD   Brief Narrative:  Kyle Gonzales 27 year old male with history of Graves' disease, PSC, anxiety and significant depression presented to the emergency department complaining of epigastric pain for 3 days prior to admission. 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 ESR elevated and gallbladder sludge without acute cholecystitis.  CT also showed questionable inflamed appendix.  Surgery was consulted and felt that this is not acute appendicitis.  Patient was admitted with working diagnosis of intractable abdominal pain with nausea and vomiting.  GI was consulted suspect mesenteric adenitis versus PSC started on trial of prednisone.  Subjective: Patient seen and examined, he has some improvement since yesterday.  Still having nausea and vomiting and main epigastric pain.  No acute events overnight.  Denies vomiting.   Assessment & Plan: Abdominal pain Multifactorial, ileus vs mesenteric adenitis versus PSC in the differential diagnosis. CT scan showed ?  Inflamed appendix, surgery following an feels this is not acute appendicitis.  GI was consulted started patient on prednisone with mild improvement.  Encourage ambulation, continue clear liquid diet.  Will decrease narcotic medication.  Use Toradol for pain.  GI recommendations appreciated.  Ileus Ambulation, avoid narcotics, continue MiraLAX, surgery following  Hypertension BP slight above goal. Will resume home medication, continue to monitor BP closely  Hypothyroidism Continue Synthroid  DVT prophylaxis: SCDs, ambulation Code Status: Full code Family Communication: Mother at bedside Disposition Plan: Home in 1 to 2 days  Consultants:   GI  General  surgery  Procedures:   None  Antimicrobials:  None   Objective: Vitals:   04/27/18 1355 04/27/18 2009 04/27/18 2348 04/28/18 0410  BP: 138/82 (!) 144/80 (!) 141/80 140/80  Pulse: 83 71 90 89  Resp: 20 18 19 18   Temp: 98.8 F (37.1 C) 98.7 F (37.1 C) 98.2 F (36.8 C) 98.3 F (36.8 C)  TempSrc: Oral Oral Oral Oral  SpO2: 97% 99% 100% 100%  Weight:      Height:        Intake/Output Summary (Last 24 hours) at 04/28/2018 1408 Last data filed at 04/27/2018 2200 Gross per 24 hour  Intake 183.4 ml  Output 175 ml  Net 8.4 ml   Filed Weights   04/27/18 0257  Weight: 100.4 kg (221 lb 5.5 oz)    Examination:  General exam: Appears calm and comfortable  HEENT: AC/AT, PERRLA, OP moist and clear Respiratory system: Clear to auscultation. No wheezes,crackle or rhonchi Cardiovascular system: S1 & S2 heard, RRR. No JVD, murmurs, rubs or gallops Gastrointestinal system: Abdomen is nondistended, soft and nontender. No organomegaly or masses felt. Normal bowel sounds heard. Central nervous system: Alert and oriented. No focal neurological deficits. Extremities: No pedal edema. Symmetric, strength 5/5   Skin: No rashes, lesions or ulcers Psychiatry: Judgement and insight appear normal. Mood & affect appropriate.    Data Reviewed: I have personally reviewed following labs and imaging studies  CBC: Recent Labs  Lab 04/24/18 2137 04/26/18 1125 04/26/18 1843 04/27/18 0522 04/28/18 0549  WBC 13.4* 11.2* 9.0 5.8 7.3  NEUTROABS  --  10,170* 8.2*  --  6.3  HGB 14.7 14.7 14.2 12.8* 12.5*  HCT 42.6 42.3 41.7 38.6* 37.7*  MCV 87.5 84.3 88.0 89.1 88.1  PLT 202 89* 61* 67* 101*  Basic Metabolic Panel: Recent Labs  Lab 04/24/18 2137 04/26/18 1125 04/26/18 1843 04/27/18 0522 04/28/18 0549  NA 139 138 138 139 138  K 3.2* 3.8 3.5 3.5 3.4*  CL 102 98 101 101 100  CO2 20* 30 26 28 29   GLUCOSE 138* 114* 117* 110* 131*  BUN 14 11 12 11 9   CREATININE 1.16 1.10 1.00 1.00 0.90    CALCIUM 9.9 10.0 9.3 9.3 9.1  MG  --  2.0  --  2.0 2.0  PHOS  --   --   --  2.2* 3.3   GFR: Estimated Creatinine Clearance: 150 mL/min (by C-G formula based on SCr of 0.9 mg/dL). Liver Function Tests: Recent Labs  Lab 04/24/18 2137 04/26/18 1125 04/26/18 1843 04/27/18 0522  AST 22 17 20 15   ALT 17 12 14 12   ALKPHOS 80  --  64 62  BILITOT 1.1 1.3* 1.1 1.0  PROT 8.7* 7.8 8.0 7.4  ALBUMIN 4.9  --  4.2 3.6   Recent Labs  Lab 04/24/18 2137 04/26/18 1843  LIPASE 25 20   No results for input(s): AMMONIA in the last 168 hours. Coagulation Profile: Recent Labs  Lab 04/24/18 2249  INR 1.14   Cardiac Enzymes: Recent Labs  Lab 04/27/18 0120  TROPONINI <0.03   BNP (last 3 results) No results for input(s): PROBNP in the last 8760 hours. HbA1C: No results for input(s): HGBA1C in the last 72 hours. CBG: No results for input(s): GLUCAP in the last 168 hours. Lipid Profile: No results for input(s): CHOL, HDL, LDLCALC, TRIG, CHOLHDL, LDLDIRECT in the last 72 hours. Thyroid Function Tests: Recent Labs    04/27/18 0522  TSH 5.591*   Anemia Panel: No results for input(s): VITAMINB12, FOLATE, FERRITIN, TIBC, IRON, RETICCTPCT in the last 72 hours. Sepsis Labs: Recent Labs  Lab 04/27/18 0120  LATICACIDVEN 1.0    No results found for this or any previous visit (from the past 240 hour(s)).    Radiology Studies: Mr Abdomen W Or Wo Contrast  Result Date: 04/26/2018 CLINICAL DATA:  Followup cholecystitis and cholangitis. History of primary sclerosing cholangitis. EXAM: MRI ABDOMEN WITHOUT AND WITH CONTRAST TECHNIQUE: Multiplanar multisequence MR imaging of the abdomen was performed both before and after the administration of intravenous contrast. CONTRAST:  53m MULTIHANCE GADOBENATE DIMEGLUMINE 529 MG/ML IV SOLN COMPARISON:  Ultrasound 04/24/2018.  MRI 11/20/2017 and 02/13/2017. FINDINGS: Lower chest:  The visualized lower chest appears unremarkable. Hepatobiliary: Typical  hemangiomas are again noted within the right hepatic lobe, measuring 2.0 cm on image 15/5 and 13 mm on image 25/5. These demonstrate typical enhancement. There are no suspicious hepatic findings. There is stable mild intrahepatic biliary dilatation and irregularity consistent with sclerosing cholangitis. There is dependent sludge within the gallbladder. No evidence of choledocholithiasis or extrahepatic biliary dilatation. 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 Pancreas: Unremarkable. No pancreatic ductal dilatation or surrounding inflammatory changes. Spleen: Normal in size without focal abnormality. Adrenals/Urinary Tract: Both adrenal glands appear normal. The kidneys appear normal without evidence of urinary tract calculus or hydronephrosis. Bladder not imaged. Stomach/Bowel: The stomach appears decompressed. There are multiple mildly dilated loops of small bowel in the mid abdomen. The visualized colon appears normal in caliber. Vascular/Lymphatic: There are no enlarged abdominal lymph nodes. No acute vascular findings are seen. 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 Other: There is a small amount of ascites which appears new. No focal extraluminal fluid collection seen. Musculoskeletal: No acute or significant osseous findings.  IMPRESSION: 1. Mild small bowel dilatation and ascites. Findings could be secondary to ileus and peritonitis. Radiographic follow up recommended to exclude early obstruction. 2. Stable appearance of the biliary system consistent with sclerosing cholangitis. No significant biliary dilatation or evidence of choledocholithiasis. Mild gallbladder sludge. 3. 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 4. Stable hepatic hemangiomas. Electronically Signed   By: Richardean Sale M.D.   On: 04/26/2018 23:15   Ct Abdomen Pelvis W Contrast  Result Date: 04/27/2018 CLINICAL DATA:  Abdominal pain with chills but no fever for 3 days.  Bowel dilatation and ascites on MRCP. History of sclerosing cholangitis. EXAM: CT ABDOMEN AND PELVIS WITH CONTRAST TECHNIQUE: Multidetector CT imaging of the abdomen and pelvis was performed using the standard protocol following bolus administration of intravenous contrast. CONTRAST:  110m ISOVUE-300 IOPAMIDOL (ISOVUE-300) INJECTION 61% COMPARISON:  MRI 04/26/2018 and 11/20/2017. FINDINGS: Lower chest: Mild linear atelectasis at both lung bases. No significant pleural or pericardial effusion. Hepatobiliary: There are known hemangiomas within the right hepatic lobe which are stable. There is stable mild intrahepatic biliary dilatation and irregularity consistent with sclerosing cholangitis. Dependent high-density sludge is present in the gallbladder lumen. There is no extrahepatic biliary dilatation. Pancreas: Unremarkable. No pancreatic ductal dilatation or surrounding inflammatory changes. Spleen: Normal in size without focal abnormality. Adrenals/Urinary Tract: Both adrenal glands appear normal. Both kidneys appear normal. No evidence of urinary tract calculus or hydronephrosis. There is contrast material within the bladder. No evidence of bladder wall thickening. Stomach/Bowel: The initial portal phase images are motion degraded (due to patient vomiting) and no enteric contrast was administered. Delayed imaging was performed through the pelvis. No significant small or large bowel wall thickening, distention or surrounding inflammation. Because of these limitations, confident identification of the appendix is limited, although there is an apparent blind ending tubular structure extending into the midline pelvis which measures up to 11 mm in diameter on image 65/4. This is also seen on coronal images 59 and 60 of series 9. Vascular/Lymphatic: There are no enlarged abdominal or pelvic lymph nodes. No acute vascular findings. 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 Reproductive: The prostate gland and  seminal vesicles appear normal. Other: Small amount of ascites, predominately in the pelvis and subhepatic space. No focal extraluminal fluid collection. There is mild mesenteric edema. Musculoskeletal: No acute or significant osseous findings. IMPRESSION: 1. Study is limited by motion and the lack of enteric contrast. There is possible inflammation of the appendix in the low pelvis, and acute appendicitis cannot be excluded. Surgical evaluation recommended. 2. There is mild ascites and nonspecific mesenteric edema. No focal extraluminal fluid collection or bowel obstruction identified. 3. Stable appearance of the liver with mild intrahepatic biliary dilatation and irregularity attributed to sclerosing cholangitis. 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 4. These results were called by telephone at the time of interpretation on 04/27/2018 at 3:02 am to the patient's nurse MCorliss Blacker who verbally acknowledged these results. Electronically Signed   By: WRichardean SaleM.D.   On: 04/27/2018 03:02   Dg Abd Portable 1v  Result Date: 04/28/2018 CLINICAL DATA:  Evaluate ileus. EXAM: PORTABLE ABDOMEN - 1 VIEW COMPARISON:  04/27/2018. FINDINGS: Diffuse gaseous distension of the large and small bowel loops are identified. This appears increased from previous exam with increase in diameter of small bowel loops. IMPRESSION: 1. Interval progression of ileus pattern. Electronically Signed   By: TKerby MoorsM.D.   On: 04/28/2018 07:55      Scheduled Meds: .  levothyroxine  75 mcg Oral QAC breakfast  . pantoprazole  40 mg Oral Daily  . polyethylene glycol  17 g Oral Daily  . potassium chloride  40 mEq Oral Q4H  . predniSONE  20 mg Oral Q breakfast  . sucralfate  1 g Oral BID   Continuous Infusions:   LOS: 0 days    Time spent: Total of 25 minutes spent with pt, greater than 50% of which was spent in discussion of  treatment, counseling and coordination of care   Chipper Oman, MD Pager:  Text Page via [email or web address]   If 7PM-7AM, please contact night-coverage [email or web address] 04/28/2018, 2:08 PM   Note - This record has been created using [employer]. Chart creation errors have been sought, but may not always have been located. Such creation errors do not reflect on the standard of medical care.

## 2018-04-28 NOTE — Progress Notes (Signed)
Subjective/Chief Complaint: Feels better, took some clears, some nausea, no bowel function per patient   Objective: Vital signs in last 24 hours: Temp:  [98.2 F (36.8 C)-98.8 F (37.1 C)] 98.3 F (36.8 C) (07/14 0410) Pulse Rate:  [71-90] 89 (07/14 0410) Resp:  [18-20] 18 (07/14 0410) BP: (138-144)/(80-82) 140/80 (07/14 0410) SpO2:  [97 %-100 %] 100 % (07/14 0410) Last BM Date: 04/26/18  Intake/Output from previous day: 07/13 0701 - 07/14 0700 In: 183.4 [IV Piggyback:183.4] Out: 375 [Urine:375] Intake/Output this shift: No intake/output data recorded.  abd soft today, not really tender on my exam with some bs  Lab Results:  Recent Labs    04/27/18 0522 04/28/18 0549  WBC 5.8 7.3  HGB 12.8* 12.5*  HCT 38.6* 37.7*  PLT 67* 101*   BMET Recent Labs    04/27/18 0522 04/28/18 0549  NA 139 138  K 3.5 3.4*  CL 101 100  CO2 28 29  GLUCOSE 110* 131*  BUN 11 9  CREATININE 1.00 0.90  CALCIUM 9.3 9.1   PT/INR No results for input(s): LABPROT, INR in the last 72 hours. ABG No results for input(s): PHART, HCO3 in the last 72 hours.  Invalid input(s): PCO2, PO2  Studies/Results: Mr Abdomen W Or Wo Contrast  Result Date: 04/26/2018 CLINICAL DATA:  Followup cholecystitis and cholangitis. History of primary sclerosing cholangitis. EXAM: MRI ABDOMEN WITHOUT AND WITH CONTRAST TECHNIQUE: Multiplanar multisequence MR imaging of the abdomen was performed both before and after the administration of intravenous contrast. CONTRAST:  69mL MULTIHANCE GADOBENATE DIMEGLUMINE 529 MG/ML IV SOLN COMPARISON:  Ultrasound 04/24/2018.  MRI 11/20/2017 and 02/13/2017. FINDINGS: Lower chest:  The visualized lower chest appears unremarkable. Hepatobiliary: Typical hemangiomas are again noted within the right hepatic lobe, measuring 2.0 cm on image 15/5 and 13 mm on image 25/5. These demonstrate typical enhancement. There are no suspicious hepatic findings. There is stable mild intrahepatic  biliary dilatation and irregularity consistent with sclerosing cholangitis. There is dependent sludge within the gallbladder. No evidence of choledocholithiasis or extrahepatic biliary dilatation. 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 Pancreas: Unremarkable. No pancreatic ductal dilatation or surrounding inflammatory changes. Spleen: Normal in size without focal abnormality. Adrenals/Urinary Tract: Both adrenal glands appear normal. The kidneys appear normal without evidence of urinary tract calculus or hydronephrosis. Bladder not imaged. Stomach/Bowel: The stomach appears decompressed. There are multiple mildly dilated loops of small bowel in the mid abdomen. The visualized colon appears normal in caliber. Vascular/Lymphatic: There are no enlarged abdominal lymph nodes. No acute vascular findings are seen. 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 Other: There is a small amount of ascites which appears new. No focal extraluminal fluid collection seen. Musculoskeletal: No acute or significant osseous findings. IMPRESSION: 1. Mild small bowel dilatation and ascites. Findings could be secondary to ileus and peritonitis. Radiographic follow up recommended to exclude early obstruction. 2. Stable appearance of the biliary system consistent with sclerosing cholangitis. No significant biliary dilatation or evidence of choledocholithiasis. Mild gallbladder sludge. 3. 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 4. Stable hepatic hemangiomas. Electronically Signed   By: Richardean Sale M.D.   On: 04/26/2018 23:15   Ct Abdomen Pelvis W Contrast  Result Date: 04/27/2018 CLINICAL DATA:  Abdominal pain with chills but no fever for 3 days. Bowel dilatation and ascites on MRCP. History of sclerosing cholangitis. EXAM: CT ABDOMEN AND PELVIS WITH CONTRAST TECHNIQUE: Multidetector CT imaging of the abdomen and pelvis was performed using the standard  protocol following  bolus administration of intravenous contrast. CONTRAST:  120mL ISOVUE-300 IOPAMIDOL (ISOVUE-300) INJECTION 61% COMPARISON:  MRI 04/26/2018 and 11/20/2017. FINDINGS: Lower chest: Mild linear atelectasis at both lung bases. No significant pleural or pericardial effusion. Hepatobiliary: There are known hemangiomas within the right hepatic lobe which are stable. There is stable mild intrahepatic biliary dilatation and irregularity consistent with sclerosing cholangitis. Dependent high-density sludge is present in the gallbladder lumen. There is no extrahepatic biliary dilatation. Pancreas: Unremarkable. No pancreatic ductal dilatation or surrounding inflammatory changes. Spleen: Normal in size without focal abnormality. Adrenals/Urinary Tract: Both adrenal glands appear normal. Both kidneys appear normal. No evidence of urinary tract calculus or hydronephrosis. There is contrast material within the bladder. No evidence of bladder wall thickening. Stomach/Bowel: The initial portal phase images are motion degraded (due to patient vomiting) and no enteric contrast was administered. Delayed imaging was performed through the pelvis. No significant small or large bowel wall thickening, distention or surrounding inflammation. Because of these limitations, confident identification of the appendix is limited, although there is an apparent blind ending tubular structure extending into the midline pelvis which measures up to 11 mm in diameter on image 65/4. This is also seen on coronal images 59 and 60 of series 9. Vascular/Lymphatic: There are no enlarged abdominal or pelvic lymph nodes. No acute vascular findings. 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 Reproductive: The prostate gland and seminal vesicles appear normal. Other: Small amount of ascites, predominately in the pelvis and subhepatic space. No focal extraluminal fluid collection. There is mild mesenteric edema. Musculoskeletal: No acute or significant  osseous findings. IMPRESSION: 1. Study is limited by motion and the lack of enteric contrast. There is possible inflammation of the appendix in the low pelvis, and acute appendicitis cannot be excluded. Surgical evaluation recommended. 2. There is mild ascites and nonspecific mesenteric edema. No focal extraluminal fluid collection or bowel obstruction identified. 3. Stable appearance of the liver with mild intrahepatic biliary dilatation and irregularity attributed to sclerosing cholangitis. 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 4. These results were called by telephone at the time of interpretation on 04/27/2018 at 3:02 am to the patient's nurse Corliss Blacker, who verbally acknowledged these results. Electronically Signed   By: Richardean Sale M.D.   On: 04/27/2018 03:02   Dg Abd Portable 1v  Result Date: 04/28/2018 CLINICAL DATA:  Evaluate ileus. EXAM: PORTABLE ABDOMEN - 1 VIEW COMPARISON:  04/27/2018. FINDINGS: Diffuse gaseous distension of the large and small bowel loops are identified. This appears increased from previous exam with increase in diameter of small bowel loops. IMPRESSION: 1. Interval progression of ileus pattern. Electronically Signed   By: Kerby Moors M.D.   On: 04/28/2018 07:55    Anti-infectives: Anti-infectives (From admission, onward)   None      Assessment/Plan: Abdominal pain PSC -has ileus on plain film today, nothing concerning on exam - agree with GI plan and will continue to follow -do not think he has appendicitis, clinically or by imaging- he has normal mri limited quailty ct scan and exam that is not consistent  Rolm Bookbinder 04/28/2018

## 2018-04-29 ENCOUNTER — Encounter (HOSPITAL_COMMUNITY): Payer: Self-pay | Admitting: Radiology

## 2018-04-29 ENCOUNTER — Inpatient Hospital Stay (HOSPITAL_COMMUNITY): Payer: 59

## 2018-04-29 DIAGNOSIS — I81 Portal vein thrombosis: Principal | ICD-10-CM

## 2018-04-29 DIAGNOSIS — I82409 Acute embolism and thrombosis of unspecified deep veins of unspecified lower extremity: Secondary | ICD-10-CM

## 2018-04-29 DIAGNOSIS — K567 Ileus, unspecified: Secondary | ICD-10-CM

## 2018-04-29 LAB — BASIC METABOLIC PANEL
Anion gap: 10 (ref 5–15)
BUN: 16 mg/dL (ref 6–20)
CALCIUM: 8.9 mg/dL (ref 8.9–10.3)
CO2: 28 mmol/L (ref 22–32)
CREATININE: 0.93 mg/dL (ref 0.61–1.24)
Chloride: 99 mmol/L (ref 98–111)
GFR calc Af Amer: >60 mL/min (ref >60)
GLUCOSE: 122 mg/dL — AB (ref 70–99)
Potassium: 3.1 mmol/L — ABNORMAL LOW (ref 3.5–5.1)
SODIUM: 137 mmol/L (ref 135–145)

## 2018-04-29 LAB — HEPARIN LEVEL (UNFRACTIONATED): Heparin Unfractionated: 0.35 IU/mL (ref 0.30–0.70)

## 2018-04-29 LAB — HEPATIC FUNCTION PANEL
ALT: 14 U/L (ref 0–44)
AST: 13 U/L — ABNORMAL LOW (ref 15–41)
Albumin: 3.2 g/dL — ABNORMAL LOW (ref 3.5–5.0)
Alkaline Phosphatase: 56 U/L (ref 38–126)
BILIRUBIN DIRECT: 0.4 mg/dL — AB (ref 0.0–0.2)
BILIRUBIN INDIRECT: 1.3 mg/dL — AB (ref 0.3–0.9)
TOTAL PROTEIN: 6.9 g/dL (ref 6.5–8.1)
Total Bilirubin: 1.7 mg/dL — ABNORMAL HIGH (ref 0.3–1.2)

## 2018-04-29 LAB — ANTITHROMBIN III: ANTITHROMB III FUNC: 88 % (ref 75–120)

## 2018-04-29 LAB — CBC
HCT: 37.4 % — ABNORMAL LOW (ref 39.0–52.0)
HEMOGLOBIN: 12.5 g/dL — AB (ref 13.0–17.0)
MCH: 29.4 pg (ref 26.0–34.0)
MCHC: 33.4 g/dL (ref 30.0–36.0)
MCV: 88 fL (ref 78.0–100.0)
Platelets: 123 10*3/uL — ABNORMAL LOW (ref 150–400)
RBC: 4.25 MIL/uL (ref 4.22–5.81)
RDW: 14.1 % (ref 11.5–15.5)
WBC: 4.5 10*3/uL (ref 4.0–10.5)

## 2018-04-29 LAB — MAGNESIUM: MAGNESIUM: 2.1 mg/dL (ref 1.7–2.4)

## 2018-04-29 MED ORDER — METOCLOPRAMIDE HCL 5 MG/ML IJ SOLN
5.0000 mg | Freq: Four times a day (QID) | INTRAMUSCULAR | Status: DC | PRN
  Administered 2018-04-29 – 2018-05-01 (×3): 5 mg via INTRAVENOUS
  Filled 2018-04-29 (×3): qty 2

## 2018-04-29 MED ORDER — IOPAMIDOL (ISOVUE-300) INJECTION 61%
100.0000 mL | Freq: Once | INTRAVENOUS | Status: AC | PRN
  Administered 2018-04-29: 100 mL via INTRAVENOUS

## 2018-04-29 MED ORDER — IOPAMIDOL (ISOVUE-300) INJECTION 61%
INTRAVENOUS | Status: AC
  Filled 2018-04-29: qty 100

## 2018-04-29 MED ORDER — HEPARIN BOLUS VIA INFUSION
4000.0000 [IU] | Freq: Once | INTRAVENOUS | Status: AC
  Administered 2018-04-29: 4000 [IU] via INTRAVENOUS
  Filled 2018-04-29: qty 4000

## 2018-04-29 MED ORDER — HEPARIN (PORCINE) IN NACL 100-0.45 UNIT/ML-% IJ SOLN
1600.0000 [IU]/h | INTRAMUSCULAR | Status: DC
  Administered 2018-04-29 – 2018-05-01 (×4): 1600 [IU]/h via INTRAVENOUS
  Filled 2018-04-29 (×5): qty 250

## 2018-04-29 MED ORDER — DIPHENHYDRAMINE HCL 50 MG/ML IJ SOLN
12.5000 mg | Freq: Once | INTRAMUSCULAR | Status: AC
  Administered 2018-04-29: 12.5 mg via INTRAVENOUS
  Filled 2018-04-29: qty 1

## 2018-04-29 MED ORDER — POTASSIUM CHLORIDE 10 MEQ/100ML IV SOLN
10.0000 meq | INTRAVENOUS | Status: AC
  Administered 2018-04-29 (×3): 10 meq via INTRAVENOUS
  Filled 2018-04-29 (×2): qty 100

## 2018-04-29 MED ORDER — FAMOTIDINE IN NACL 20-0.9 MG/50ML-% IV SOLN
20.0000 mg | Freq: Two times a day (BID) | INTRAVENOUS | Status: DC
  Administered 2018-04-29 – 2018-04-30 (×4): 20 mg via INTRAVENOUS
  Filled 2018-04-29 (×4): qty 50

## 2018-04-29 MED ORDER — GI COCKTAIL ~~LOC~~
30.0000 mL | Freq: Three times a day (TID) | ORAL | Status: DC | PRN
  Administered 2018-04-29 – 2018-05-02 (×6): 30 mL via ORAL
  Filled 2018-04-29 (×6): qty 30

## 2018-04-29 NOTE — Progress Notes (Signed)
ANTICOAGULATION CONSULT NOTE - Follow up  Pharmacy Consult for heparin Indication: SMV thrombus  Allergies  Allergen Reactions  . Tylenol [Acetaminophen]     r/t elevated LFTs    Patient Measurements: Height: 6\' 3"  (190.5 cm) Weight: 221 lb 5.5 oz (100.4 kg) IBW/kg (Calculated) : 84.5 Heparin Dosing Weight: 100.4  Vital Signs: Temp: 98 F (36.7 C) (07/15 1409) Temp Source: Oral (07/15 1409) BP: 148/80 (07/15 1409) Pulse Rate: 64 (07/15 1409)  Labs: Recent Labs    04/27/18 0120  04/27/18 0522 04/28/18 0549 04/29/18 0850 04/29/18 2017  HGB  --    < > 12.8* 12.5* 12.5*  --   HCT  --   --  38.6* 37.7* 37.4*  --   PLT  --   --  67* 101* 123*  --   HEPARINUNFRC  --   --   --   --   --  0.35  CREATININE  --   --  1.00 0.90 0.93  --   TROPONINI <0.03  --   --   --   --   --    < > = values in this interval not displayed.    Estimated Creatinine Clearance: 145.1 mL/min (by C-G formula based on SCr of 0.93 mg/dL).   Medical History: Past Medical History:  Diagnosis Date  . GERD (gastroesophageal reflux disease)   . Graves disease 08/18/2015  . Hyperlipidemia   . Hypothyroid   . Palpitations   . Vitamin D deficiency     Medications:  Medications Prior to Admission  Medication Sig Dispense Refill Last Dose  . bisoprolol-hydrochlorothiazide (ZIAC) 10-6.25 MG tablet Take 1 tablet by mouth daily. 90 tablet 0 04/26/2018 at Unknown time  . Cholecalciferol (VITAMIN D3) 5000 units CAPS Take 10,000 Units by mouth daily.   04/26/2018 at Unknown time  . ezetimibe (ZETIA) 10 MG tablet Take 1 tablet daily for Cholesterol 90 tablet 0 04/26/2018 at Unknown time  . famotidine (PEPCID) 20 MG tablet Take 1 tablet (20 mg total) by mouth 2 (two) times daily. 30 tablet 0 04/26/2018 at Unknown time  . hydrOXYzine (ATARAX/VISTARIL) 25 MG tablet Take 1 tablet (25 mg total) by mouth 3 (three) times daily as needed for anxiety. 30 tablet 0 04/25/2018 at Unknown time  . levothyroxine (SYNTHROID,  LEVOTHROID) 75 MCG tablet take 1 tablet by mouth every morning ON AN EMPTY STOMACH 90 tablet 1 04/26/2018 at Unknown time  . metoCLOPramide (REGLAN) 10 MG tablet Take 1 tablet 4 x/day before meals and bedtime for Nausea & bloating 60 tablet 0 04/26/2018 at Unknown time  . omeprazole (PRILOSEC) 40 MG capsule Take 1 capsule (40 mg total) by mouth daily. Please schedule an Office visit for future refills: [phone] 30 capsule 2 04/26/2018 at Unknown time  . ondansetron (ZOFRAN) 8 MG tablet Take 1 tablet 3 x/ day before meals - Nausea 60 tablet 0 04/26/2018 at Unknown time  . sertraline (ZOLOFT) 100 MG tablet Take 1 tablet (100 mg total) by mouth at bedtime. 30 tablet 0 04/25/2018 at Unknown time  . sucralfate (CARAFATE) 1 g tablet Take 1 tablet (1 g total) by mouth 2 (two) times daily. 60 tablet 1 04/26/2018 at Unknown time  . traZODone (DESYREL) 50 MG tablet Take 1 tablet (50 mg total) by mouth at bedtime as needed for sleep. 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  Wt 100.4  kg, Hg 12.5 from 14.2 on 7/12, PLTC low at 123, was 61 on admit and 202 on 7/10.     Heparin level therapeutic at current IV heparin rate of 1600 units/hr  No reported bleeding or problems per RN  Goal of Therapy:  Heparin level 0.3-0.7 units/ml Monitor platelets by anticoagulation protocol: Yes   Plan:  1) Continue current IV heparin rate of 1600 units/hr 2) Recheck heparin level tomorrow 7/16 at 0300 3) Daily heparin level and CBC   Adrian Saran, PharmD, BCPS Pager [phone] 04/29/2018 9:04 PM

## 2018-04-29 NOTE — Progress Notes (Signed)
Progress Note   Subjective  Chief Complaint: Abdominal pain  This morning found sitting up by his bedside, tells me that he still feels minutely better than he did when coming in as he is able to stand up and walk for short distances, but overall feels like he is not improving like he would want to.  Continues with a right upper quadrant pain and some waves of nausea.  Per family member at bedside patient did become severely nauseous after taking his pills and did vomit them up.  She asked if they could be IV instead.  Of note patient tells me that he vomited after IV contrast during last CT, if we need to repeat this he would need premed.   Objective   Vital signs in last 24 hours: Temp:  [98.3 F (36.8 C)-99.1 F (37.3 C)] 98.3 F (36.8 C) (07/15 0513) Pulse Rate:  [64-68] 65 (07/15 0513) Resp:  [16-18] 16 (07/15 0513) BP: (142-147)/(76-82) 142/79 (07/15 0513) SpO2:  [97 %-100 %] 99 % (07/15 0513) Last BM Date: 04/28/18 General:    AA male in NAD Heart:  Regular rate and rhythm; no murmurs Lungs: Respirations even and unlabored, lungs CTA bilaterally Abdomen:  Soft, Mild ruq ttp and nondistended. Normal bowel sounds. Extremities:  Without edema. Neurologic:  Alert and oriented,  grossly normal neurologically. Psych:  Cooperative. Normal mood and affect.  Intake/Output from previous day: 07/14 0701 - 07/15 0700 In: -  Out: 425 [Urine:425]  Lab Results: Recent Labs    04/26/18 1843 04/27/18 0522 04/28/18 0549  WBC 9.0 5.8 7.3  HGB 14.2 12.8* 12.5*  HCT 41.7 38.6* 37.7*  PLT 61* 67* 101*   BMET Recent Labs    04/27/18 0522 04/28/18 0549 04/29/18 0850  NA 139 138 137  K 3.5 3.4* 3.1*  CL 101 100 99  CO2 28 29 28   GLUCOSE 110* 131* 122*  BUN 11 9 16   CREATININE 1.00 0.90 0.93  CALCIUM 9.3 9.1 8.9   LFT Recent Labs    04/27/18 0522  PROT 7.4  ALBUMIN 3.6  AST 15  ALT 12  ALKPHOS 62  BILITOT 1.0   Studies/Results: Dg Abd Portable 1v  Result  Date: 04/28/2018 CLINICAL DATA:  Evaluate ileus. EXAM: PORTABLE ABDOMEN - 1 VIEW COMPARISON:  04/27/2018. FINDINGS: Diffuse gaseous distension of the large and small bowel loops are identified. This appears increased from previous exam with increase in diameter of small bowel loops. IMPRESSION: 1. Interval progression of ileus pattern. Electronically Signed   By: Kerby Moors M.D.   On: 04/28/2018 07:55     Assessment / Plan:   Assessment: 1.  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  Gallbladder sludge: likely incidental 3.?  Inflamed appendix on imaging:sx does not feel appendicitis 4.  Ileus: some nausea, small BM this morning, but last "good one" was a week ago  Plan: 1.  We will await to Dr. Vena Rua assessment.  Discussed with patient that we may proceed with repeat CT at this time.  Patient would need premed as he did have vomiting with contrast at last CT. 2.  Patient also asked for his pills to be IV.  We will discuss  switching to IV Prednisone with Dr. Hilarie Fredrickson. 3.  Continue other supportive measures 4.  Please await any further recommendations from Dr. Hilarie Fredrickson later today  Thank you for your kind consultation, we will continue to follow along.    LOS: 1 day   Levin Erp  04/29/2018, 10:01 AM  Pager # [phone]

## 2018-04-29 NOTE — Progress Notes (Signed)
[facility] Progress Note     Subjective: CC-  Sitting up in bed, family at bedside. States that overall he feels better than when he came in, but still feels uncomfortable. States that his pain is mostly localized now to his RUQ. Worse with movement. Reports nausea and vomiting. Taking in few full liquids. Appetite suppressed. He had 3 small loose BMs yesterday. Passing flatus. GI planning to repeat CT scan today.  Objective: Vital signs in last 24 hours: Temp:  [98.3 F (36.8 C)-99.1 F (37.3 C)] 98.3 F (36.8 C) (07/15 0513) Pulse Rate:  [64-68] 65 (07/15 0513) Resp:  [16-18] 16 (07/15 0513) BP: (142-147)/(76-82) 142/79 (07/15 0513) SpO2:  [97 %-100 %] 99 % (07/15 0513) Last BM Date: 04/28/18  Intake/Output from previous day: 07/14 0701 - 07/15 0700 In: -  Out: 425 [Urine:425] Intake/Output this shift: Total I/O In: -  Out: 1 [Urine:1]  PE: Gen:  Alert, NAD, pleasant HEENT: EOM's intact, pupils equal and round Card:  RRR, no M/G/R heard Pulm:  CTAB, no W/R/R, effort normal Abd: Soft, ND, TTP RUQ, +BS, no HSM, no hernia Psych: A&Ox3  Skin: no rashes noted, warm and dry  Lab Results:  Recent Labs    04/27/18 0522 04/28/18 0549  WBC 5.8 7.3  HGB 12.8* 12.5*  HCT 38.6* 37.7*  PLT 67* 101*   BMET Recent Labs    04/28/18 0549 04/29/18 0850  NA 138 137  K 3.4* 3.1*  CL 100 99  CO2 29 28  GLUCOSE 131* 122*  BUN 9 16  CREATININE 0.90 0.93  CALCIUM 9.1 8.9   PT/INR No results for input(s): LABPROT, INR in the last 72 hours. CMP     Component Value Date/Time   NA 137 04/29/2018 0850   K 3.1 (L) 04/29/2018 0850   CL 99 04/29/2018 0850   CO2 28 04/29/2018 0850   GLUCOSE 122 (H) 04/29/2018 0850   BUN 16 04/29/2018 0850   CREATININE 0.93 04/29/2018 0850   CREATININE 1.10 04/26/2018 1125   CALCIUM 8.9 04/29/2018 0850   PROT 7.4 04/27/2018 0522   ALBUMIN 3.6 04/27/2018 0522   AST 15 04/27/2018 0522   ALT 12 04/27/2018 0522   ALKPHOS 62  04/27/2018 0522   BILITOT 1.0 04/27/2018 0522   GFRNONAA >60 04/29/2018 0850   GFRNONAA 93 04/26/2018 1125   GFRAA >60 04/29/2018 0850   GFRAA 108 04/26/2018 1125   Lipase     Component Value Date/Time   LIPASE 20 04/26/2018 1843       Studies/Results: Dg Abd Portable 1v  Result Date: 04/28/2018 CLINICAL DATA:  Evaluate ileus. EXAM: PORTABLE ABDOMEN - 1 VIEW COMPARISON:  04/27/2018. FINDINGS: Diffuse gaseous distension of the large and small bowel loops are identified. This appears increased from previous exam with increase in diameter of small bowel loops. IMPRESSION: 1. Interval progression of ileus pattern. Electronically Signed   By: Kerby Moors M.D.   On: 04/28/2018 07:55    Anti-infectives: Anti-infectives (From admission, onward)   None       Assessment/Plan Graves disease Hypothyroidism GERD HLD  Abdominal pain - unclear source: appendicitis, cholecystitis, ileus?? - CT scan 7/13 concern for possible appendicitis, but clinically does not seem to have appendicitis - ileus on xray but clinically that seems to be resolving as he is having bowel function - LFTs WNL on admission  ID - none FEN - IVF, FLD VTE - SCDs Foley - none  Plan - Repeat CBC/CMP. Repeat CT scan  pending, will follow.   LOS: 1 day    Wellington Hampshire , [facility] 04/29/2018, 10:30 AM Pager: [phone] Consults: [phone] Mon 7:00 am -11:30 AM Tues-Fri 7:00 am-4:30 pm Sat-Sun 7:00 am-11:30 am

## 2018-04-29 NOTE — Progress Notes (Signed)
PROGRESS NOTE [facility]   [name]   NGE:[id number] DOB: 1991/01/15  DOA: 04/26/2018 PCP: [name], MD   Brief Narrative:  Kyle Gonzales 27 year old male with history of Graves' disease, PSC, anxiety and significant depression presented to the emergency department complaining of epigastric pain for 3 days prior to admission. 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 ESR elevated and gallbladder sludge without acute cholecystitis.  CT also showed questionable inflamed appendix.  Surgery was consulted and felt that this is not acute appendicitis. Patient was admitted with working diagnosis of intractable abdominal pain with nausea and vomiting.  GI was consulted suspect mesenteric adenitis versus PSC started on trial of prednisone.  Subjective: Continues to complaints of belly pain, continues with nausea and some emesis.  No diarrhea.  Assessment & Plan: Abdominal pain Secondary to acute SMV thrombosis Inflamed appendix, surgery following an feels this is not acute appendicitis.   ? Small duct PSC, however prednisone not helping. Started on IV heparin, hypercoag w/u, continue pain control as needed. Will place NPO. Add IV pepcid   SMV thrombus  Unclear etiology, patient with hx of Graves and ? Primary sclerosing cholangitis. Patient has hx of chronic portal thrombosis. Will need hypercoag w/u. Will add ANA and PANCA as well  Heparin ggt,   Ileus Ambulation, avoid narcotics, continue MiraLAX, surgery following  Hypertension BP stable  Continue home medication, continue to monitor BP closely  Hypothyroidism (Grave's)  Continue Synthroid  DVT prophylaxis: SCDs, ambulation Code Status: Full code Family Communication: Mother at bedside Disposition Plan: Unable to determine   Consultants:   GI  General  surgery  Procedures:   None  Antimicrobials:  None   Objective: Vitals:   04/28/18 1422 04/28/18 2003 04/29/18 0513 04/29/18 1409  BP: (!) 146/76 (!) 147/82 (!) 142/79 (!) 148/80  Pulse: 64 68 65 64  Resp: 16 18 16 18   Temp: 99.1 F (37.3 C) 98.4 F (36.9 C) 98.3 F (36.8 C)   TempSrc: Oral Oral Oral   SpO2: 97% 100% 99% 99%  Weight:      Height:        Intake/Output Summary (Last 24 hours) at 04/29/2018 1457 Last data filed at 04/29/2018 0750 Gross per 24 hour  Intake -  Output 1 ml  Net -1 ml   Filed Weights   04/27/18 0257  Weight: 100.4 kg (221 lb 5.5 oz)    Examination:  General: Pt is alert, awake, not in acute distress Cardiovascular: RRR, S1/S2 +, no rubs, no gallops Respiratory: CTA bilaterally, no wheezing, no rhonchi Abdominal: Soft, diffuse tenderness, no guarding, + BS, no organomegaly  Extremities: no edema  Data Reviewed: I have personally reviewed following labs and imaging studies  CBC: Recent Labs  Lab 04/26/18 1125 04/26/18 1843 04/27/18 0522 04/28/18 0549 04/29/18 0850  WBC 11.2* 9.0 5.8 7.3 4.5  NEUTROABS 10,170* 8.2*  --  6.3  --   HGB 14.7 14.2 12.8* 12.5* 12.5*  HCT 42.3 41.7 38.6* 37.7* 37.4*  MCV 84.3 88.0 89.1 88.1 88.0  PLT 89* 61* 67* 101* 401*   Basic Metabolic Panel: Recent Labs  Lab 04/26/18 1125 04/26/18 1843 04/27/18 0522 04/28/18 0549 04/29/18 0850  NA 138 138 139 138 137  K 3.8 3.5 3.5 3.4* 3.1*  CL 98 101 101 100 99  CO2 30 26 28 29 28   GLUCOSE  114* 117* 110* 131* 122*  BUN 11 12 11 9 16   CREATININE 1.10 1.00 1.00 0.90 0.93  CALCIUM 10.0 9.3 9.3 9.1 8.9  MG 2.0  --  2.0 2.0 2.1  PHOS  --   --  2.2* 3.3  --    GFR: Estimated Creatinine Clearance: 145.1 mL/min (by C-G formula based on SCr of 0.93 mg/dL). Liver Function Tests: Recent Labs  Lab 04/24/18 2137 04/26/18 1125 04/26/18 1843 04/27/18 0522 04/29/18 0850  AST 22 17 20 15  13*  ALT 17 12 14 12 14   ALKPHOS 80  --  64 62 56  BILITOT 1.1  1.3* 1.1 1.0 1.7*  PROT 8.7* 7.8 8.0 7.4 6.9  ALBUMIN 4.9  --  4.2 3.6 3.2*   Recent Labs  Lab 04/24/18 2137 04/26/18 1843  LIPASE 25 20   No results for input(s): AMMONIA in the last 168 hours. Coagulation Profile: Recent Labs  Lab 04/24/18 2249  INR 1.14   Cardiac Enzymes: Recent Labs  Lab 04/27/18 0120  TROPONINI <0.03   BNP (last 3 results) No results for input(s): PROBNP in the last 8760 hours. HbA1C: No results for input(s): HGBA1C in the last 72 hours. CBG: No results for input(s): GLUCAP in the last 168 hours. Lipid Profile: No results for input(s): CHOL, HDL, LDLCALC, TRIG, CHOLHDL, LDLDIRECT in the last 72 hours. Thyroid Function Tests: Recent Labs    04/27/18 0522  TSH 5.591*   Anemia Panel: No results for input(s): VITAMINB12, FOLATE, FERRITIN, TIBC, IRON, RETICCTPCT in the last 72 hours. Sepsis Labs: Recent Labs  Lab 04/27/18 0120  LATICACIDVEN 1.0    No results found for this or any previous visit (from the past 240 hour(s)).    Radiology Studies: Ct Abdomen Pelvis W Contrast  Result Date: 04/29/2018 CLINICAL DATA:  Inpatient. Generalized mid upper abdominal pain. 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 EXAM: CT ABDOMEN AND PELVIS WITH CONTRAST TECHNIQUE: Multidetector CT imaging of the abdomen and pelvis was performed using the standard protocol following bolus administration of intravenous contrast. CONTRAST:  131m ISOVUE-300 IOPAMIDOL (ISOVUE-300) INJECTION 61% COMPARISON:  04/27/2018 CT abdomen/pelvis.  04/26/2018 MRI abdomen. FINDINGS: Lower chest: No significant pulmonary nodules or acute consolidative airspace disease. 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 No new liver masses. Stable caudate lobe hypertrophy without definite liver surface irregularity. Hyperdense material in  fundal gallbladder is compatible with sludge or tiny stones. No gallbladder wall thickening or significant pericholecystic fluid. Mild diffuse intrahepatic biliary ductal dilatation, most prominent in the left liver lobe, with relative sparing of caudate and right liver lobe not convincingly changed back to 01/12/2017 MRI. Stable top-normal CBD diameter 6 mm. Pancreas: Normal, with no mass or duct dilation. Spleen: Normal size. No mass. Adrenals/Urinary Tract: Normal adrenals. Normal kidneys with no hydronephrosis and no renal mass. Collapsed normal bladder. Stomach/Bowel: Normal non-distended stomach. There are several mildly dilated small bowel loops throughout the abdomen with air-fluid levels measuring up to 3.8 cm diameter, not appreciably changed. Some of these dilated small bowel loops demonstrate mild small bowel wall thickening, mildly increased. No focal small bowel caliber transition. No definite pneumatosis. Appendix is mildly dilated (10 mm diameter), mildly increased. Borderline mild diffuse appendiceal wall thickening and hyperenhancement with mild periappendiceal fluid. Fluid levels and mild gas scattered throughout the right and transverse colon. No large bowel wall thickening or diverticular disease. Vascular/Lymphatic: Normal caliber  abdominal aorta. 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 Patent splenic vein. 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 Patent renal veins. No pathologically enlarged lymph nodes in the abdomen or pelvis. Reproductive: Normal size prostate. Other: No pneumoperitoneum.  Small volume pelvic ascites. Musculoskeletal: No aggressive appearing focal osseous lesions. IMPRESSION: 1. 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 2. 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 3.  Persistent mild dilatation of small bowel loops throughout the abdomen with air-fluid levels. Increased mild small bowel wall thickening. No definite pneumatosis. Small bowel ischemia cannot be excluded. No free air. 4. Nonspecific dilatation and mild wall thickening in the appendix, mildly increased. 5. Small volume pelvic ascites. 6. Chronic mild intrahepatic biliary ductal dilatation is unchanged back to 2018 MRI studies. No acute biliary abnormality. These results were called by telephone at the time of interpretation on 04/29/2018 at 12:48 pm to Dr. Doreatha Lew , who verbally acknowledged these results. Electronically Signed   By: Ilona Sorrel M.D.   On: 04/29/2018 12:54   Dg Abd Portable 1v  Result Date: 04/28/2018 CLINICAL DATA:  Evaluate ileus. EXAM: PORTABLE ABDOMEN - 1 VIEW COMPARISON:  04/27/2018. FINDINGS: Diffuse gaseous distension of the large and small bowel loops are identified. This appears increased from previous exam with increase in diameter of small bowel loops. IMPRESSION: 1. Interval progression of ileus pattern. Electronically Signed   By: Kerby Moors M.D.   On: 04/28/2018 07:55     Scheduled Meds: . bisoprolol-hydrochlorothiazide  1 tablet Oral Daily  . fluticasone  1 spray Each Nare Daily  . iopamidol      . levothyroxine  75 mcg Oral QAC breakfast  . polyethylene glycol  17 g Oral Daily   Continuous Infusions: . famotidine (PEPCID) IV Stopped (04/29/18 1307)  . heparin 1,600 Units/hr (04/29/18 1405)  . potassium chloride 10 mEq (04/29/18 1307)     LOS: 1 day    Time spent: Total of 25 minutes spent with pt, greater than 50% of which was spent in discussion of  treatment, counseling and coordination of care   Chipper Oman, MD Pager: Text Page via [email or web address]   If 7PM-7AM, please contact night-coverage [email or web address] 04/29/2018, 2:57 PM   Note - This record has been created using [employer]. Chart creation errors have been sought, but may not  always have been located. Such creation errors do not reflect on the standard of medical care.

## 2018-04-29 NOTE — Progress Notes (Signed)
ANTICOAGULATION CONSULT NOTE - Initial Consult  Pharmacy Consult for heparin Indication: SMV thrombus  Allergies  Allergen Reactions  . Tylenol [Acetaminophen]     r/t elevated LFTs    Patient Measurements: Height: 6\' 3"  (190.5 cm) Weight: 221 lb 5.5 oz (100.4 kg) IBW/kg (Calculated) : 84.5 Heparin Dosing Weight: 100.4  Vital Signs: Temp: 98.3 F (36.8 C) (07/15 0513) Temp Source: Oral (07/15 0513) BP: 142/79 (07/15 0513) Pulse Rate: 65 (07/15 0513)  Labs: Recent Labs    04/27/18 0120 04/27/18 0522 04/28/18 0549 04/29/18 0850  HGB  --  12.8* 12.5* 12.5*  HCT  --  38.6* 37.7* 37.4*  PLT  --  67* 101* 123*  CREATININE  --  1.00 0.90 0.93  TROPONINI <0.03  --   --   --     Estimated Creatinine Clearance: 145.1 mL/min (by C-G formula based on SCr of 0.93 mg/dL).   Medical History: Past Medical History:  Diagnosis Date  . GERD (gastroesophageal reflux disease)   . Graves disease 08/18/2015  . Hyperlipidemia   . Hypothyroid   . Palpitations   . Vitamin D deficiency     Medications:  Medications Prior to Admission  Medication Sig Dispense Refill Last Dose  . bisoprolol-hydrochlorothiazide (ZIAC) 10-6.25 MG tablet Take 1 tablet by mouth daily. 90 tablet 0 04/26/2018 at Unknown time  . Cholecalciferol (VITAMIN D3) 5000 units CAPS Take 10,000 Units by mouth daily.   04/26/2018 at Unknown time  . ezetimibe (ZETIA) 10 MG tablet Take 1 tablet daily for Cholesterol 90 tablet 0 04/26/2018 at Unknown time  . famotidine (PEPCID) 20 MG tablet Take 1 tablet (20 mg total) by mouth 2 (two) times daily. 30 tablet 0 04/26/2018 at Unknown time  . hydrOXYzine (ATARAX/VISTARIL) 25 MG tablet Take 1 tablet (25 mg total) by mouth 3 (three) times daily as needed for anxiety. 30 tablet 0 04/25/2018 at Unknown time  . levothyroxine (SYNTHROID, LEVOTHROID) 75 MCG tablet take 1 tablet by mouth every morning ON AN EMPTY STOMACH 90 tablet 1 04/26/2018 at Unknown time  . metoCLOPramide (REGLAN) 10  MG tablet Take 1 tablet 4 x/day before meals and bedtime for Nausea & bloating 60 tablet 0 04/26/2018 at Unknown time  . omeprazole (PRILOSEC) 40 MG capsule Take 1 capsule (40 mg total) by mouth daily. Please schedule an Office visit for future refills: [phone] 30 capsule 2 04/26/2018 at Unknown time  . ondansetron (ZOFRAN) 8 MG tablet Take 1 tablet 3 x/ day before meals - Nausea 60 tablet 0 04/26/2018 at Unknown time  . sertraline (ZOLOFT) 100 MG tablet Take 1 tablet (100 mg total) by mouth at bedtime. 30 tablet 0 04/25/2018 at Unknown time  . sucralfate (CARAFATE) 1 g tablet Take 1 tablet (1 g total) by mouth 2 (two) times daily. 60 tablet 1 04/26/2018 at Unknown time  . traZODone (DESYREL) 50 MG tablet Take 1 tablet (50 mg total) by mouth at bedtime as needed for sleep. 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  Wt 100.4 kg, Hg 12.5 from 14.2 on 7/12, PLTC low at 123, was 61 on admit and 202 on 7/10.    Goal of Therapy:  Heparin level 0.3-0.7 units/ml Monitor platelets by anticoagulation protocol: Yes   Plan:  Give 4000 units bolus x 1 Start heparin infusion at 1600 units/hr Check anti-Xa level in 6 hours and daily while on heparin  Continue to monitor H&H and platelets  Eudelia Bunch, Pharm.D 04/29/2018 1:57 PM

## 2018-04-30 LAB — COMPREHENSIVE METABOLIC PANEL
ALK PHOS: 64 U/L (ref 38–126)
ALT: 15 U/L (ref 0–44)
ANION GAP: 14 (ref 5–15)
AST: 16 U/L (ref 15–41)
Albumin: 3.8 g/dL (ref 3.5–5.0)
BILIRUBIN TOTAL: 2.8 mg/dL — AB (ref 0.3–1.2)
BUN: 15 mg/dL (ref 6–20)
CALCIUM: 9.3 mg/dL (ref 8.9–10.3)
CO2: 25 mmol/L (ref 22–32)
CREATININE: 1.01 mg/dL (ref 0.61–1.24)
Chloride: 100 mmol/L (ref 98–111)
Glucose, Bld: 109 mg/dL — ABNORMAL HIGH (ref 70–99)
Potassium: 3.1 mmol/L — ABNORMAL LOW (ref 3.5–5.1)
Sodium: 139 mmol/L (ref 135–145)
Total Protein: 7.9 g/dL (ref 6.5–8.1)

## 2018-04-30 LAB — CBC
HCT: 38.7 % — ABNORMAL LOW (ref 39.0–52.0)
Hemoglobin: 12.9 g/dL — ABNORMAL LOW (ref 13.0–17.0)
MCH: 29.1 pg (ref 26.0–34.0)
MCHC: 33.3 g/dL (ref 30.0–36.0)
MCV: 87.4 fL (ref 78.0–100.0)
PLATELETS: 115 10*3/uL — AB (ref 150–400)
RBC: 4.43 MIL/uL (ref 4.22–5.81)
RDW: 14.1 % (ref 11.5–15.5)
WBC: 4.4 10*3/uL (ref 4.0–10.5)

## 2018-04-30 LAB — HOMOCYSTEINE: Homocysteine: 22.4 umol/L — ABNORMAL HIGH (ref 0.0–15.0)

## 2018-04-30 LAB — HEPARIN LEVEL (UNFRACTIONATED): HEPARIN UNFRACTIONATED: 0.37 [IU]/mL (ref 0.30–0.70)

## 2018-04-30 MED ORDER — TRAZODONE HCL 50 MG PO TABS
50.0000 mg | ORAL_TABLET | Freq: Every evening | ORAL | Status: DC | PRN
  Administered 2018-04-30 – 2018-05-01 (×2): 50 mg via ORAL
  Filled 2018-04-30 (×2): qty 1

## 2018-04-30 MED ORDER — HYDROXYZINE HCL 25 MG PO TABS
25.0000 mg | ORAL_TABLET | Freq: Three times a day (TID) | ORAL | Status: DC | PRN
  Administered 2018-04-30: 25 mg via ORAL
  Filled 2018-04-30: qty 1

## 2018-04-30 MED ORDER — DIPHENHYDRAMINE HCL 50 MG/ML IJ SOLN
25.0000 mg | Freq: Once | INTRAMUSCULAR | Status: AC
  Administered 2018-04-30: 25 mg via INTRAVENOUS
  Filled 2018-04-30: qty 1

## 2018-04-30 MED ORDER — SERTRALINE HCL 100 MG PO TABS
100.0000 mg | ORAL_TABLET | Freq: Every day | ORAL | Status: DC
  Administered 2018-04-30 – 2018-05-01 (×2): 100 mg via ORAL
  Filled 2018-04-30 (×2): qty 1

## 2018-04-30 MED ORDER — POLYETHYLENE GLYCOL 3350 17 G PO PACK
17.0000 g | PACK | Freq: Every day | ORAL | Status: DC
  Administered 2018-04-30 – 2018-05-02 (×3): 17 g via ORAL
  Filled 2018-04-30 (×2): qty 1

## 2018-04-30 NOTE — Progress Notes (Signed)
PROGRESS NOTE [facility]   Kyle Gonzales   NFA:[id number] DOB: Feb 25, 1991  DOA: 04/26/2018 PCP: Kyle Pinto, MD   Brief Narrative:  Kyle Gonzales 27 year old male with history of Graves' disease, PSC, anxiety and significant depression presented to the emergency department complaining of epigastric pain for 3 days prior to admission. 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 ESR elevated and gallbladder sludge without acute cholecystitis.  CT also showed questionable inflamed appendix.  Surgery was consulted and felt that this is not acute appendicitis. Patient was admitted with working diagnosis of intractable abdominal pain with nausea and vomiting.  GI was consulted suspect mesenteric adenitis versus PSC started on trial of prednisone.  Repeated CT scan shows acute SMV thrombosis patient started on heparin drip  Subjective: Patient feeling much better than yesterday, no further nausea or vomiting.  Have a bowel movement today. Overall improving.   Assessment & Plan: Abdominal pain Secondary to acute SMV thrombosis Inflamed appendix, surgery following an feels this is not acute appendicitis.   ? Small duct PSC, however prednisone not helping. Continue IV heparin, hypercoag pending Advancing diet as tolerated, continue pepcid    SMV thrombus  Unclear etiology, patient with hx of Graves and ? Primary sclerosing cholangitis. Patient has hx of chronic portal thrombosis. Hypercoag w/u pending. ANA and PANCA pending  Continue Hep ggt   Ileus - clinically resolved  Ambulation, avoid narcotics, continue MiraLAX, surgery following  Hypertension BP stable  Continue home medication, continue to monitor BP closely  Hypothyroidism (Grave's)  Continue Synthroid  DVT prophylaxis: SCDs, ambulation Code Status: Full  code Family Communication: Mother at bedside Disposition Plan: Home in 1-2 days if tolerating diet   Consultants:   GI  General surgery  Procedures:   None  Antimicrobials:  None   Objective: Vitals:   04/29/18 1409 04/29/18 2205 04/30/18 0455 04/30/18 1449  BP: (!) 148/80 (!) 142/91 (!) 147/84 (!) 112/59  Pulse: 64 (!) 53 85 68  Resp: 18 18 16 17   Temp: 98 F (36.7 C) 98.4 F (36.9 C) 98.6 F (37 C) 98.9 F (37.2 C)  TempSrc: Oral     SpO2: 99% 100% 100% 100%  Weight:      Height:        Intake/Output Summary (Last 24 hours) at 04/30/2018 1629 Last data filed at 04/30/2018 1350 Gross per 24 hour  Intake 480 ml  Output 100 ml  Net 380 ml   Filed Weights   04/27/18 0257  Weight: 100.4 kg (221 lb 5.5 oz)    Examination:  General: Pt is alert, awake, not in acute distress Cardiovascular: RRR, S1/S2 +, no rubs, no gallops Respiratory: CTA bilaterally, no wheezing, no rhonchi Abdominal: Soft, NT, ND, bowel sounds + Extremities: no edema, no cyanosis  Data Reviewed: I have personally reviewed following labs and imaging studies  CBC: Recent Labs  Lab 04/26/18 1125 04/26/18 1843 04/27/18 0522 04/28/18 0549 04/29/18 0850 04/30/18 0624  WBC 11.2* 9.0 5.8 7.3 4.5 4.4  NEUTROABS 10,170* 8.2*  --  6.3  --   --   HGB 14.7 14.2 12.8* 12.5* 12.5* 12.9*  HCT 42.3 41.7 38.6* 37.7* 37.4* 38.7*  MCV 84.3 88.0 89.1 88.1 88.0 87.4  PLT 89* 61* 67* 101* 123* 469*   Basic Metabolic Panel: Recent Labs  Lab 04/26/18 1125 04/26/18 1843 04/27/18 0522 04/28/18 0549 04/29/18  8309 04/30/18 0624  NA 138 138 139 138 137 139  K 3.8 3.5 3.5 3.4* 3.1* 3.1*  CL 98 101 101 100 99 100  CO2 30 26 28 29 28 25   GLUCOSE 114* 117* 110* 131* 122* 109*  BUN 11 12 11 9 16 15   CREATININE 1.10 1.00 1.00 0.90 0.93 1.01  CALCIUM 10.0 9.3 9.3 9.1 8.9 9.3  MG 2.0  --  2.0 2.0 2.1  --   PHOS  --   --  2.2* 3.3  --   --    GFR: Estimated Creatinine Clearance: 133.6 mL/min (by  C-G formula based on SCr of 1.01 mg/dL). Liver Function Tests: Recent Labs  Lab 04/24/18 2137 04/26/18 1125 04/26/18 1843 04/27/18 0522 04/29/18 0850 04/30/18 0624  AST 22 17 20 15  13* 16  ALT 17 12 14 12 14 15   ALKPHOS 80  --  64 62 56 64  BILITOT 1.1 1.3* 1.1 1.0 1.7* 2.8*  PROT 8.7* 7.8 8.0 7.4 6.9 7.9  ALBUMIN 4.9  --  4.2 3.6 3.2* 3.8   Recent Labs  Lab 04/24/18 2137 04/26/18 1843  LIPASE 25 20   No results for input(s): AMMONIA in the last 168 hours. Coagulation Profile: Recent Labs  Lab 04/24/18 2249  INR 1.14   Cardiac Enzymes: Recent Labs  Lab 04/27/18 0120  TROPONINI <0.03   BNP (last 3 results) No results for input(s): PROBNP in the last 8760 hours. HbA1C: No results for input(s): HGBA1C in the last 72 hours. CBG: No results for input(s): GLUCAP in the last 168 hours. Lipid Profile: No results for input(s): CHOL, HDL, LDLCALC, TRIG, CHOLHDL, LDLDIRECT in the last 72 hours. Thyroid Function Tests: No results for input(s): TSH, T4TOTAL, FREET4, T3FREE, THYROIDAB in the last 72 hours. Anemia Panel: No results for input(s): VITAMINB12, FOLATE, FERRITIN, TIBC, IRON, RETICCTPCT in the last 72 hours. Sepsis Labs: Recent Labs  Lab 04/27/18 0120  LATICACIDVEN 1.0    No results found for this or any previous visit (from the past 240 hour(s)).    Radiology Studies: Ct Abdomen Pelvis W Contrast  Result Date: 04/29/2018 CLINICAL DATA:  Inpatient. Generalized mid upper abdominal pain. 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 EXAM: CT ABDOMEN AND PELVIS WITH CONTRAST TECHNIQUE: Multidetector CT imaging of the abdomen and pelvis was performed using the standard protocol following bolus administration of intravenous contrast. CONTRAST:  144m ISOVUE-300 IOPAMIDOL (ISOVUE-300) INJECTION 61% COMPARISON:  04/27/2018 CT abdomen/pelvis.  04/26/2018 MRI abdomen. FINDINGS: Lower chest: No significant pulmonary nodules or acute consolidative airspace disease.  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 No new liver masses. Stable caudate lobe hypertrophy without definite liver surface irregularity. Hyperdense material in fundal gallbladder is compatible with sludge or tiny stones. No gallbladder wall thickening or significant pericholecystic fluid. Mild diffuse intrahepatic biliary ductal dilatation, most prominent in the left liver lobe, with relative sparing of caudate and right liver lobe not convincingly changed back to 01/12/2017 MRI. Stable top-normal CBD diameter 6 mm. Pancreas: Normal, with no mass or duct dilation. Spleen: Normal size. No mass. Adrenals/Urinary Tract: Normal adrenals. Normal kidneys with no hydronephrosis and no renal mass. Collapsed normal bladder. Stomach/Bowel: Normal non-distended stomach. There are several mildly dilated small bowel loops throughout the abdomen with air-fluid levels measuring up to 3.8 cm diameter, not appreciably changed. Some of these dilated small bowel loops demonstrate mild small bowel  wall thickening, mildly increased. No focal small bowel caliber transition. No definite pneumatosis. Appendix is mildly dilated (10 mm diameter), mildly increased. Borderline mild diffuse appendiceal wall thickening and hyperenhancement with mild periappendiceal fluid. Fluid levels and mild gas scattered throughout the right and transverse colon. No large bowel wall thickening or diverticular disease. Vascular/Lymphatic: Normal caliber abdominal aorta. 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 Patent splenic vein. 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 Patent renal veins. No pathologically enlarged lymph nodes in the abdomen or pelvis. Reproductive:  Normal size prostate. Other: No pneumoperitoneum.  Small volume pelvic ascites. Musculoskeletal: No aggressive appearing focal osseous lesions. IMPRESSION: 1. 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 2. 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 3. Persistent mild dilatation of small bowel loops throughout the abdomen with air-fluid levels. Increased mild small bowel wall thickening. No definite pneumatosis. Small bowel ischemia cannot be excluded. No free air. 4. Nonspecific dilatation and mild wall thickening in the appendix, mildly increased. 5. Small volume pelvic ascites. 6. Chronic mild intrahepatic biliary ductal dilatation is unchanged back to 2018 MRI studies. No acute biliary abnormality. These results were called by telephone at the time of interpretation on 04/29/2018 at 12:48 pm to Dr. Doreatha Lew , who verbally acknowledged these results. Electronically Signed   By: Ilona Sorrel M.D.   On: 04/29/2018 12:54    Scheduled Meds: . bisoprolol-hydrochlorothiazide  1 tablet Oral Daily  . fluticasone  1 spray Each Nare Daily  . levothyroxine  75 mcg Oral QAC breakfast  . polyethylene glycol  17 g Oral Daily   Continuous Infusions: . famotidine (PEPCID) IV Stopped (04/30/18 0930)  . heparin 1,600 Units/hr (04/30/18 0132)     LOS: 2 days    Time spent: Total of 25 minutes spent with pt, greater than 50% of which was spent in discussion of  treatment, counseling and coordination of care   Chipper Oman, MD Pager: Text Page via [email or web address]   If 7PM-7AM, please contact night-coverage [email or web address] 04/30/2018, 4:29 PM   Note - This record has been created using [employer]. Chart creation errors have been sought, but may not always have been located. Such creation errors do not reflect on the standard of medical care.

## 2018-04-30 NOTE — Progress Notes (Signed)
Progress Note   Subjective  Chief Complaint: Abdominal pain, acute SMV thrombosis  Today, patient found laying in bed, he reports that he is feeling much better than yesterday.  No further nausea this morning.  Did have a very good bowel movement this morning.  Overall much improved.  Denies any new complaints.   Objective   Vital signs in last 24 hours: Temp:  [98 F (36.7 C)-98.6 F (37 C)] 98.6 F (37 C) (07/16 0455) Pulse Rate:  [53-85] 85 (07/16 0455) Resp:  [16-18] 16 (07/16 0455) BP: (142-148)/(80-91) 147/84 (07/16 0455) SpO2:  [99 %-100 %] 100 % (07/16 0455) Last BM Date: 04/30/18 General:    AA male in NAD Heart:  Regular rate and rhythm; no murmurs Lungs: Respirations even and unlabored, lungs CTA bilaterally Abdomen:  Soft, nontender and nondistended. Normal bowel sounds. Extremities:  Without edema. Neurologic:  Alert and oriented,  grossly normal neurologically. Psych:  Cooperative. Normal mood and affect.  Intake/Output from previous day: 07/15 0701 - 07/16 0700 In: 416.3 [I.V.:14.7; IV Piggyback:401.7] Out: 102 [Urine:102] Intake/Output this shift: Total I/O In: 240 [P.O.:240] Out: -   Lab Results: Recent Labs    04/28/18 0549 04/29/18 0850 04/30/18 0624  WBC 7.3 4.5 4.4  HGB 12.5* 12.5* 12.9*  HCT 37.7* 37.4* 38.7*  PLT 101* 123* 115*   BMET Recent Labs    04/28/18 0549 04/29/18 0850 04/30/18 0624  NA 138 137 139  K 3.4* 3.1* 3.1*  CL 100 99 100  CO2 29 28 25   GLUCOSE 131* 122* 109*  BUN 9 16 15   CREATININE 0.90 0.93 1.01  CALCIUM 9.1 8.9 9.3   LFT Recent Labs    04/29/18 0850 04/30/18 0624  PROT 6.9 7.9  ALBUMIN 3.2* 3.8  AST 13* 16  ALT 14 15  ALKPHOS 56 64  BILITOT 1.7* 2.8*  BILIDIR 0.4*  --   IBILI 1.3*  --     Studies/Results: Ct Abdomen Pelvis W Contrast  Result Date: 04/29/2018 CLINICAL DATA:  Inpatient. Generalized mid upper abdominal pain. 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 EXAM: CT ABDOMEN AND PELVIS  WITH CONTRAST TECHNIQUE: Multidetector CT imaging of the abdomen and pelvis was performed using the standard protocol following bolus administration of intravenous contrast. CONTRAST:  171mL ISOVUE-300 IOPAMIDOL (ISOVUE-300) INJECTION 61% COMPARISON:  04/27/2018 CT abdomen/pelvis.  04/26/2018 MRI abdomen. FINDINGS: Lower chest: No significant pulmonary nodules or acute consolidative airspace disease. 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 No new liver masses. Stable caudate lobe hypertrophy without definite liver surface irregularity. Hyperdense material in fundal gallbladder is compatible with sludge or tiny stones. No gallbladder wall thickening or significant pericholecystic fluid. Mild diffuse intrahepatic biliary ductal dilatation, most prominent in the left liver lobe, with relative sparing of caudate and right liver lobe not convincingly changed back to 01/12/2017 MRI. Stable top-normal CBD diameter 6 mm. Pancreas: Normal, with no mass or duct dilation. Spleen: Normal size. No mass. Adrenals/Urinary Tract: Normal adrenals. Normal kidneys with no hydronephrosis and no renal mass. Collapsed normal bladder. Stomach/Bowel: Normal non-distended stomach. There are several mildly dilated small bowel loops throughout the abdomen with air-fluid levels measuring up to 3.8 cm diameter, not appreciably changed. Some of these dilated small bowel loops demonstrate mild small bowel wall thickening, mildly increased. No focal small bowel caliber transition. No definite pneumatosis. Appendix is mildly dilated (10 mm diameter),  mildly increased. Borderline mild diffuse appendiceal wall thickening and hyperenhancement with mild periappendiceal fluid. Fluid levels and mild gas scattered throughout the right and transverse colon. No large bowel wall thickening or  diverticular disease. Vascular/Lymphatic: Normal caliber abdominal aorta. 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 Patent splenic vein. 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 Patent renal veins. No pathologically enlarged lymph nodes in the abdomen or pelvis. Reproductive: Normal size prostate. Other: No pneumoperitoneum.  Small volume pelvic ascites. Musculoskeletal: No aggressive appearing focal osseous lesions. IMPRESSION: 1. 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 2. 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 3. Persistent mild dilatation of small bowel loops throughout the abdomen with air-fluid levels. Increased mild small bowel wall thickening. No definite pneumatosis. Small bowel ischemia cannot be excluded. No free air. 4. Nonspecific dilatation and mild wall thickening in the appendix, mildly increased. 5. Small volume pelvic ascites. 6. Chronic mild intrahepatic biliary ductal dilatation is unchanged back to 2018 MRI studies. No acute biliary abnormality. These results were called by telephone at the time of interpretation on 04/29/2018 at 12:48 pm to Dr. Doreatha Lew , who verbally acknowledged these results. Electronically Signed   By: Ilona Sorrel M.D.   On: 04/29/2018 12:54       Assessment / Plan:   Assessment: 1.  Abdominal pain: Now thought mostly due to SMV, currently feeling better after heparin started yesterday 2.  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  Gallbladder sludge 4.  Ileus: Now improved/resolved with a large bowel movement this morning, passing flatus  Plan: 1.  Continue treatment for SMV with heparin 2.  Continue to monitor closely for small bowel ischemia as this is possible, though the patient is improved today 3.  Please await any further  recommendations from Dr. Hilarie Fredrickson later today  Thank you for your kind consultation, we will continue to follow along   LOS: 2 days   Levin Erp  04/30/2018, 10:07 AM  Pager # [phone]

## 2018-04-30 NOTE — Progress Notes (Signed)
[facility] Progress Note     Subjective: CC: abdominal pain Patient resting in bed. Pain is improved somewhat from yesterday, patient reports pain is mostly with certain movements/coughing. Had a BM overnight. Some nausea overnight but none this AM.    Objective: Vital signs in last 24 hours: Temp:  [98 F (36.7 C)-98.6 F (37 C)] 98.6 F (37 C) (07/16 0455) Pulse Rate:  [53-85] 85 (07/16 0455) Resp:  [16-18] 16 (07/16 0455) BP: (142-148)/(80-91) 147/84 (07/16 0455) SpO2:  [99 %-100 %] 100 % (07/16 0455) Last BM Date: 04/28/18  Intake/Output from previous day: 07/15 0701 - 07/16 0700 In: 416.3 [I.V.:14.7; IV Piggyback:401.7] Out: 102 [Urine:102] Intake/Output this shift: No intake/output data recorded.  PE: Gen:  Alert, NAD, pleasant Card:  Regular rate and rhythm, pedal pulses 2+ BL Pulm:  Normal effort, clear to auscultation bilaterally Abd: Soft, non-tender, non-distended, bowel sounds present, no HSM Skin: warm and dry, no rashes  Psych: A&Ox3   Lab Results:  Recent Labs    04/29/18 0850 04/30/18 0624  WBC 4.5 4.4  HGB 12.5* 12.9*  HCT 37.4* 38.7*  PLT 123* 115*   BMET Recent Labs    04/29/18 0850 04/30/18 0624  NA 137 139  K 3.1* 3.1*  CL 99 100  CO2 28 25  GLUCOSE 122* 109*  BUN 16 15  CREATININE 0.93 1.01  CALCIUM 8.9 9.3   PT/INR No results for input(s): LABPROT, INR in the last 72 hours. CMP     Component Value Date/Time   NA 139 04/30/2018 0624   K 3.1 (L) 04/30/2018 0624   CL 100 04/30/2018 0624   CO2 25 04/30/2018 0624   GLUCOSE 109 (H) 04/30/2018 0624   BUN 15 04/30/2018 0624   CREATININE 1.01 04/30/2018 0624   CREATININE 1.10 04/26/2018 1125   CALCIUM 9.3 04/30/2018 0624   PROT 7.9 04/30/2018 0624   ALBUMIN 3.8 04/30/2018 0624   AST 16 04/30/2018 0624   ALT 15 04/30/2018 0624   ALKPHOS 64 04/30/2018 0624   BILITOT 2.8 (H) 04/30/2018 0624   GFRNONAA >60 04/30/2018 0624   GFRNONAA 93 04/26/2018 1125   GFRAA >60  04/30/2018 0624   GFRAA 108 04/26/2018 1125   Lipase     Component Value Date/Time   LIPASE 20 04/26/2018 1843       Studies/Results: Ct Abdomen Pelvis W Contrast  Result Date: 04/29/2018 CLINICAL DATA:  Inpatient. Generalized mid upper abdominal pain. 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 EXAM: CT ABDOMEN AND PELVIS WITH CONTRAST TECHNIQUE: Multidetector CT imaging of the abdomen and pelvis was performed using the standard protocol following bolus administration of intravenous contrast. CONTRAST:  128mL ISOVUE-300 IOPAMIDOL (ISOVUE-300) INJECTION 61% COMPARISON:  04/27/2018 CT abdomen/pelvis.  04/26/2018 MRI abdomen. FINDINGS: Lower chest: No significant pulmonary nodules or acute consolidative airspace disease. 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 No new liver masses. Stable caudate lobe hypertrophy without definite liver surface irregularity. Hyperdense material in fundal gallbladder is compatible with sludge or tiny stones. No gallbladder wall thickening or significant pericholecystic fluid. Mild diffuse intrahepatic biliary ductal dilatation, most prominent in the left liver lobe, with relative sparing of caudate and right liver lobe not convincingly changed back to 01/12/2017 MRI. Stable top-normal CBD diameter 6 mm. Pancreas: Normal, with no mass or duct dilation. Spleen: Normal size. No mass. Adrenals/Urinary Tract: Normal adrenals. Normal kidneys with no  hydronephrosis and no renal mass. Collapsed normal bladder. Stomach/Bowel: Normal non-distended stomach. There are several mildly dilated small bowel loops throughout the abdomen with air-fluid levels measuring up to 3.8 cm diameter, not appreciably changed. Some of these dilated small bowel loops demonstrate mild small bowel wall thickening, mildly increased. No focal  small bowel caliber transition. No definite pneumatosis. Appendix is mildly dilated (10 mm diameter), mildly increased. Borderline mild diffuse appendiceal wall thickening and hyperenhancement with mild periappendiceal fluid. Fluid levels and mild gas scattered throughout the right and transverse colon. No large bowel wall thickening or diverticular disease. Vascular/Lymphatic: Normal caliber abdominal aorta. 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 Patent splenic vein. 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 Patent renal veins. No pathologically enlarged lymph nodes in the abdomen or pelvis. Reproductive: Normal size prostate. Other: No pneumoperitoneum.  Small volume pelvic ascites. Musculoskeletal: No aggressive appearing focal osseous lesions. IMPRESSION: 1. 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 2. 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 3. Persistent mild dilatation of small bowel loops throughout the abdomen with air-fluid levels. Increased mild small bowel wall thickening. No definite pneumatosis. Small bowel ischemia cannot be excluded. No free air. 4. Nonspecific dilatation and mild wall thickening in the appendix, mildly increased. 5. Small volume pelvic ascites. 6. Chronic mild intrahepatic biliary ductal dilatation is unchanged back to 2018 MRI studies. No acute biliary abnormality. These results were called by telephone at the time of interpretation on 04/29/2018 at 12:48 pm to Dr. Doreatha Lew , who verbally acknowledged these results. Electronically Signed   By: Ilona Sorrel M.D.   On: 04/29/2018 12:54    Anti-infectives: Anti-infectives (From admission, onward)   None       Assessment/Plan Graves disease Hypothyroidism GERD HLD  Abdominal pain SMV thrombosis - CT scan 7/13  concern for possible appendicitis, but clinically does not seem to have appendicitis - ileus on xray but clinically that seems to be resolving as he is having bowel function - CT yesterday showed acute SMV thrombosis with chronic main portal vein occlusion - started on heparin yesterday - AST/ALT 16/15, Tbili 2.8 from 1.7 yesterday - continue to follow Hypokalemia - K 3.1, recommend PO replacement  ID - none FEN - CLD and ADAT VTE - SCDs, heparin gtt Foley - none  Plan - SMV thrombosis explains symptoms of upper abdominal pain and CT findingsof mild bowel wall thickening. Started on heparin yesterday. Needs hematology consult and hypercoagulability workup.     LOS: 2 days    Brigid Re , [facility] 04/30/2018, 8:26 AM Pager: [phone] Consults: [phone] Mon-Fri 7:00 am-4:30 pm Sat-Sun 7:00 am-11:30 am

## 2018-04-30 NOTE — Progress Notes (Signed)
Providing support and resources with Lowella Dandy and mother at bedside.

## 2018-04-30 NOTE — Progress Notes (Signed)
ANTICOAGULATION CONSULT NOTE - Follow up  Pharmacy Consult for heparin Indication: SMV thrombus  Allergies  Allergen Reactions  . Tylenol [Acetaminophen]     r/t elevated LFTs    Patient Measurements: Height: 6\' 3"  (190.5 cm) Weight: 221 lb 5.5 oz (100.4 kg) IBW/kg (Calculated) : 84.5 Heparin Dosing Weight: 100.4  Vital Signs: Temp: 98.6 F (37 C) (07/16 0455) BP: 147/84 (07/16 0455) Pulse Rate: 85 (07/16 0455)  Labs: Recent Labs    04/28/18 0549 04/29/18 0850 04/29/18 2017 04/30/18 0624  HGB 12.5* 12.5*  --  12.9*  HCT 37.7* 37.4*  --  38.7*  PLT 101* 123*  --  115*  HEPARINUNFRC  --   --  0.35 0.37  CREATININE 0.90 0.93  --  1.01    Estimated Creatinine Clearance: 133.6 mL/min (by C-G formula based on SCr of 1.01 mg/dL).   Medical History: Past Medical History:  Diagnosis Date  . GERD (gastroesophageal reflux disease)   . Graves disease 08/18/2015  . Hyperlipidemia   . Hypothyroid   . Palpitations   . Vitamin D deficiency     Medications:  Medications Prior to Admission  Medication Sig Dispense Refill Last Dose  . bisoprolol-hydrochlorothiazide (ZIAC) 10-6.25 MG tablet Take 1 tablet by mouth daily. 90 tablet 0 04/26/2018 at Unknown time  . Cholecalciferol (VITAMIN D3) 5000 units CAPS Take 10,000 Units by mouth daily.   04/26/2018 at Unknown time  . ezetimibe (ZETIA) 10 MG tablet Take 1 tablet daily for Cholesterol 90 tablet 0 04/26/2018 at Unknown time  . famotidine (PEPCID) 20 MG tablet Take 1 tablet (20 mg total) by mouth 2 (two) times daily. 30 tablet 0 04/26/2018 at Unknown time  . hydrOXYzine (ATARAX/VISTARIL) 25 MG tablet Take 1 tablet (25 mg total) by mouth 3 (three) times daily as needed for anxiety. 30 tablet 0 04/25/2018 at Unknown time  . levothyroxine (SYNTHROID, LEVOTHROID) 75 MCG tablet take 1 tablet by mouth every morning ON AN EMPTY STOMACH 90 tablet 1 04/26/2018 at Unknown time  . metoCLOPramide (REGLAN) 10 MG tablet Take 1 tablet 4 x/day before  meals and bedtime for Nausea & bloating 60 tablet 0 04/26/2018 at Unknown time  . omeprazole (PRILOSEC) 40 MG capsule Take 1 capsule (40 mg total) by mouth daily. Please schedule an Office visit for future refills: [phone] 30 capsule 2 04/26/2018 at Unknown time  . ondansetron (ZOFRAN) 8 MG tablet Take 1 tablet 3 x/ day before meals - Nausea 60 tablet 0 04/26/2018 at Unknown time  . sertraline (ZOLOFT) 100 MG tablet Take 1 tablet (100 mg total) by mouth at bedtime. 30 tablet 0 04/25/2018 at Unknown time  . sucralfate (CARAFATE) 1 g tablet Take 1 tablet (1 g total) by mouth 2 (two) times daily. 60 tablet 1 04/26/2018 at Unknown time  . traZODone (DESYREL) 50 MG tablet Take 1 tablet (50 mg total) by mouth at bedtime as needed for sleep. 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  Wt 100.4 kg, Hg 12.5 from 14.2 on 7/12, PLTC low at 123, was 61 on admit and 202 on 7/10.    Today, 04/30/18  Heparin level continues to be  therapeutic at current IV heparin rate of 1600 units/hr  No reported bleeding or problems per RN  CBC stable  Goal of Therapy:  Heparin level 0.3-0.7 units/ml Monitor platelets by anticoagulation protocol: Yes   Plan:  1) Continue current IV heparin rate of 1600 units/hr 2) Daily heparin level and CBC   Adrian Saran, PharmD, BCPS Pager [phone] 04/30/2018 8:55 AM

## 2018-05-01 LAB — BASIC METABOLIC PANEL
Anion gap: 11 (ref 5–15)
BUN: 9 mg/dL (ref 6–20)
CALCIUM: 8.7 mg/dL — AB (ref 8.9–10.3)
CO2: 27 mmol/L (ref 22–32)
CREATININE: 0.81 mg/dL (ref 0.61–1.24)
Chloride: 101 mmol/L (ref 98–111)
GFR calc Af Amer: >60 mL/min (ref >60)
GLUCOSE: 118 mg/dL — AB (ref 70–99)
Potassium: 3 mmol/L — ABNORMAL LOW (ref 3.5–5.1)
SODIUM: 139 mmol/L (ref 135–145)

## 2018-05-01 LAB — CBC
HCT: 34.3 % — ABNORMAL LOW (ref 39.0–52.0)
Hemoglobin: 11.6 g/dL — ABNORMAL LOW (ref 13.0–17.0)
MCH: 29.3 pg (ref 26.0–34.0)
MCHC: 33.8 g/dL (ref 30.0–36.0)
MCV: 86.6 fL (ref 78.0–100.0)
PLATELETS: 112 10*3/uL — AB (ref 150–400)
RBC: 3.96 MIL/uL — ABNORMAL LOW (ref 4.22–5.81)
RDW: 14 % (ref 11.5–15.5)
WBC: 5.7 10*3/uL (ref 4.0–10.5)

## 2018-05-01 LAB — CARDIOLIPIN ANTIBODIES, IGG, IGM, IGA
ANTICARDIOLIPIN IGM: 18 [MPL'U]/mL — AB (ref 0–12)
Anticardiolipin IgA: <9 APL U/mL (ref 0–11)
Anticardiolipin IgG: <9 GPL U/mL (ref 0–14)

## 2018-05-01 LAB — HEPATIC FUNCTION PANEL
ALT: 20 U/L (ref 0–44)
AST: 17 U/L (ref 15–41)
Albumin: 3 g/dL — ABNORMAL LOW (ref 3.5–5.0)
Alkaline Phosphatase: 67 U/L (ref 38–126)
BILIRUBIN INDIRECT: 0.8 mg/dL (ref 0.3–0.9)
BILIRUBIN TOTAL: 1.3 mg/dL — AB (ref 0.3–1.2)
Bilirubin, Direct: 0.5 mg/dL — ABNORMAL HIGH (ref 0.0–0.2)
TOTAL PROTEIN: 6.4 g/dL — AB (ref 6.5–8.1)

## 2018-05-01 LAB — ANA W/REFLEX IF POSITIVE: ANA: NEGATIVE

## 2018-05-01 LAB — BETA-2-GLYCOPROTEIN I ABS, IGG/M/A: Beta-2 Glyco I IgG: <9 GPI IgG units (ref 0–20)

## 2018-05-01 LAB — HEPARIN LEVEL (UNFRACTIONATED): HEPARIN UNFRACTIONATED: 0.32 [IU]/mL (ref 0.30–0.70)

## 2018-05-01 LAB — MAGNESIUM: MAGNESIUM: 2 mg/dL (ref 1.7–2.4)

## 2018-05-01 LAB — PROTEIN C, TOTAL: Protein C, Total: 90 % (ref 60–150)

## 2018-05-01 MED ORDER — VITAMIN C 500 MG PO TABS
500.0000 mg | ORAL_TABLET | Freq: Every day | ORAL | Status: DC
  Administered 2018-05-01 – 2018-05-02 (×2): 500 mg via ORAL
  Filled 2018-05-01 (×2): qty 1

## 2018-05-01 MED ORDER — BISACODYL 10 MG RE SUPP
10.0000 mg | Freq: Three times a day (TID) | RECTAL | Status: DC | PRN
  Administered 2018-05-01: 10 mg via RECTAL
  Filled 2018-05-01 (×2): qty 1

## 2018-05-01 MED ORDER — POTASSIUM CHLORIDE CRYS ER 20 MEQ PO TBCR
40.0000 meq | EXTENDED_RELEASE_TABLET | ORAL | Status: AC
  Administered 2018-05-01 (×2): 40 meq via ORAL
  Filled 2018-05-01 (×2): qty 2

## 2018-05-01 MED ORDER — APIXABAN 5 MG PO TABS
10.0000 mg | ORAL_TABLET | Freq: Two times a day (BID) | ORAL | Status: DC
  Administered 2018-05-01 – 2018-05-02 (×3): 10 mg via ORAL
  Filled 2018-05-01 (×2): qty 2
  Filled 2018-05-01: qty 4

## 2018-05-01 MED ORDER — DM-GUAIFENESIN ER 30-600 MG PO TB12
1.0000 | ORAL_TABLET | Freq: Two times a day (BID) | ORAL | Status: DC
  Administered 2018-05-01 – 2018-05-02 (×3): 1 via ORAL
  Filled 2018-05-01 (×3): qty 1

## 2018-05-01 MED ORDER — APIXABAN 5 MG PO TABS: 5.0000 mg | ORAL_TABLET | Freq: Two times a day (BID) | ORAL | Status: DC

## 2018-05-01 MED ORDER — FAMOTIDINE 20 MG PO TABS
20.0000 mg | ORAL_TABLET | Freq: Two times a day (BID) | ORAL | Status: DC
  Administered 2018-05-01 – 2018-05-02 (×3): 20 mg via ORAL
  Filled 2018-05-01 (×3): qty 1

## 2018-05-01 NOTE — Progress Notes (Signed)
     [facility] Progress Note  CC:  Abdominal pain and acute SMV thrombosis  Subjective:  Feeling ok, a little better.  Feels like he would like to try full liquid diet.  Passing flatus and small amounts of stool, but really only 2 BM's in the past couple of days.  Objective:  Vital signs in last 24 hours: Temp:  [98.1 F (36.7 C)-98.9 F (37.2 C)] 98.7 F (37.1 C) (07/17 0409) Pulse Rate:  [68-84] 69 (07/17 0409) Resp:  [17-18] 18 (07/17 0409) BP: (112-144)/(59-91) 144/91 (07/17 0409) SpO2:  [99 %-100 %] 99 % (07/17 0409) Last BM Date: 04/30/18 General:  Alert, Well-developed, in NAD Heart:  Regular rate and rhythm; no murmurs Pulm:  No W/R/R.  No increased WOB. Abdomen:  Soft, non-distended.  BS present.  Mild diffuse TTP. Extremities:  Without edema. Neurologic:  Alert and oriented x 4;  grossly normal neurologically. Psych:  Alert and cooperative. Normal mood and affect.  Intake/Output from previous day: 07/16 0701 - 07/17 0700 In: 480 [P.O.:480] Out: -   Lab Results: Recent Labs    04/29/18 0850 04/30/18 0624 05/01/18 0615  WBC 4.5 4.4 5.7  HGB 12.5* 12.9* 11.6*  HCT 37.4* 38.7* 34.3*  PLT 123* 115* 112*   BMET Recent Labs    04/29/18 0850 04/30/18 0624 05/01/18 0615  NA 137 139 139  K 3.1* 3.1* 3.0*  CL 99 100 101  CO2 28 25 27   GLUCOSE 122* 109* 118*  BUN 16 15 9   CREATININE 0.93 1.01 0.81  CALCIUM 8.9 9.3 8.7*   LFT Recent Labs    05/01/18 0615  PROT 6.4*  ALBUMIN 3.0*  AST 17  ALT 20  ALKPHOS 67  BILITOT 1.3*  BILIDIR 0.5*  IBILI 0.8   Assessment / Plan: 1.  Abdominal pain: Now thought mostly due to SMV thrombosis.  On heparin and switching to Eliquis. 2.  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 3.  Gallbladder sludge 4.  Ileus: Now improved/resolved.  -Will advance to full liquids.  -Needs to be monitored for another day or 2. -Needs semi-urgent outpatient hematology evaluation.   LOS: 3 days   Laban Emperor. [place]  05/01/2018, 1:11 PM

## 2018-05-01 NOTE — Discharge Instructions (Signed)
Information on my medicine - ELIQUIS (apixaban)  This medication education was reviewed with me or my healthcare representative as part of my discharge preparation.  The pharmacist that spoke with me during my hospital stay was:  Eudelia Bunch, [facility]  Why was Eliquis prescribed for you? Eliquis was prescribed to treat blood clots that may have been found in the veins of your legs (deep vein thrombosis) or in your lungs (pulmonary embolism) and to reduce the risk of them occurring again.  What do You need to know about Eliquis ? The starting dose is 10 mg (two 5 mg tablets) taken TWICE daily for the FIRST SEVEN (7) DAYS, then on Wednesday 05/08/18  the dose is reduced to ONE 5 mg tablet taken TWICE daily.  Eliquis may be taken with or without food.   Try to take the dose about the same time in the morning and in the evening. If you have difficulty swallowing the tablet whole please discuss with your pharmacist how to take the medication safely.  Take Eliquis exactly as prescribed and DO NOT stop taking Eliquis without talking to the doctor who prescribed the medication.  Stopping may increase your risk of developing a new blood clot.  Refill your prescription before you run out.  After discharge, you should have regular check-up appointments with your healthcare provider that is prescribing your Eliquis.    What do you do if you miss a dose? If a dose of ELIQUIS is not taken at the scheduled time, take it as soon as possible on the same day and twice-daily administration should be resumed. The dose should not be doubled to make up for a missed dose.  Important Safety Information A possible side effect of Eliquis is bleeding. You should call your healthcare provider right away if you experience any of the following: ? Bleeding from an injury or your nose that does not stop. ? Unusual colored urine (red or dark brown) or unusual colored stools (red or black). ? Unusual bruising for  unknown reasons. ? A serious fall or if you hit your head (even if there is no bleeding).  Some medicines may interact with Eliquis and might increase your risk of bleeding or clotting while on Eliquis. To help avoid this, consult your healthcare provider or pharmacist prior to using any new prescription or non-prescription medications, including herbals, vitamins, non-steroidal anti-inflammatory drugs (NSAIDs) and supplements.  This website has more information on Eliquis (apixaban): http://www.eliquis.com/eliquis/home The MRI did not show any serious problems in your abdomen.  We are prescribing a pain reliever and muscle relaxer to help control your symptoms.  Continue taking your medicine for esophagitis.  Follow-up with your primary care doctor or gastroenterologist as needed for problems.

## 2018-05-01 NOTE — Progress Notes (Addendum)
ANTICOAGULATION CONSULT NOTE - Follow up  Pharmacy Consult for heparin Indication: SMV thrombus  Allergies  Allergen Reactions  . Tylenol [Acetaminophen]     r/t elevated LFTs    Patient Measurements: Height: 6\' 3"  (190.5 cm) Weight: 221 lb 5.5 oz (100.4 kg) IBW/kg (Calculated) : 84.5 Heparin Dosing Weight: 100.4  Vital Signs: Temp: 98.7 F (37.1 C) (07/17 0409) BP: 144/91 (07/17 0409) Pulse Rate: 69 (07/17 0409)  Labs: Recent Labs    04/29/18 0850 04/29/18 2017 04/30/18 0624 05/01/18 0615  HGB 12.5*  --  12.9* 11.6*  HCT 37.4*  --  38.7* 34.3*  PLT 123*  --  115* 112*  HEPARINUNFRC  --  0.35 0.37 0.32  CREATININE 0.93  --  1.01 0.81    Estimated Creatinine Clearance: 166.6 mL/min (by C-G formula based on SCr of 0.81 mg/dL).  Assessment: 27 yo to start heparin per pharmacy for acute SMV thrombus on CT scan.  Wt 100.4 kg, Hg 12.5 from 14.2 on 7/12, PLTC low at 123, was 61 on admit and 202 on 7/10.    Today, 05/01/18  Heparin level continues to be therapeutic at current IV heparin rate of 1600 units/hr (HL 0.32)   No reported bleeding or problems per RN  CBC stable, PLTC remains low at 112  Goal of Therapy:  Heparin level 0.3-0.7 units/ml Monitor platelets by anticoagulation protocol: Yes   Plan:  Continue current IV heparin rate of 1600 units/hr Daily heparin level and CBC IV pepcid changed to PO ? Transition to NOAC ?  Eudelia Bunch, Pharm.D 05/01/2018 7:11 AM  Addendum:  Transition to Eliquis Eliquis 10 mg po BID x 7 days followed by Eliquis 5 mg PO BID 30 day free card provided for pt. Will educate patient and mother  Eudelia Bunch, Pharm.D 05/01/2018 1:19 PM

## 2018-05-01 NOTE — Progress Notes (Signed)
[facility] Progress Note     Subjective: CC: abdominal pain Patient up walking around room this morning. Abdominal discomfort stable relative to yesterday  - bloating sensation. Continues to report flatus and had BM yesterday. Tolerating liquids without emesis.  Objective: Vital signs in last 24 hours: Temp:  [98.1 F (36.7 C)-98.9 F (37.2 C)] 98.7 F (37.1 C) (07/17 0409) Pulse Rate:  [68-84] 69 (07/17 0409) Resp:  [17-18] 18 (07/17 0409) BP: (112-144)/(59-91) 144/91 (07/17 0409) SpO2:  [99 %-100 %] 99 % (07/17 0409) Last BM Date: 04/30/18  Intake/Output from previous day: 07/16 0701 - 07/17 0700 In: 480 [P.O.:480] Out: -  Intake/Output this shift: No intake/output data recorded.  PE: Gen:  Alert, NAD, pleasant Card:  Regular rate and rhythm, pedal pulses 2+ BL Pulm:  Normal effort, clear to auscultation bilaterally Abd: Soft, non-tender, not significantly distended Skin: warm and dry, no rashes  Psych: A&Ox3   Lab Results:  Recent Labs    04/30/18 0624 05/01/18 0615  WBC 4.4 5.7  HGB 12.9* 11.6*  HCT 38.7* 34.3*  PLT 115* 112*   BMET Recent Labs    04/30/18 0624 05/01/18 0615  NA 139 139  K 3.1* 3.0*  CL 100 101  CO2 25 27  GLUCOSE 109* 118*  BUN 15 9  CREATININE 1.01 0.81  CALCIUM 9.3 8.7*   PT/INR No results for input(s): LABPROT, INR in the last 72 hours. CMP     Component Value Date/Time   NA 139 05/01/2018 0615   K 3.0 (L) 05/01/2018 0615   CL 101 05/01/2018 0615   CO2 27 05/01/2018 0615   GLUCOSE 118 (H) 05/01/2018 0615   BUN 9 05/01/2018 0615   CREATININE 0.81 05/01/2018 0615   CREATININE 1.10 04/26/2018 1125   CALCIUM 8.7 (L) 05/01/2018 0615   PROT 6.4 (L) 05/01/2018 0615   ALBUMIN 3.0 (L) 05/01/2018 0615   AST 17 05/01/2018 0615   ALT 20 05/01/2018 0615   ALKPHOS 67 05/01/2018 0615   BILITOT 1.3 (H) 05/01/2018 0615   GFRNONAA >60 05/01/2018 0615   GFRNONAA 93 04/26/2018 1125   GFRAA >60 05/01/2018 0615   GFRAA  108 04/26/2018 1125   Lipase     Component Value Date/Time   LIPASE 20 04/26/2018 1843       Studies/Results: Ct Abdomen Pelvis W Contrast  Result Date: 04/29/2018 CLINICAL DATA:  Inpatient. Generalized mid upper abdominal pain. 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 EXAM: CT ABDOMEN AND PELVIS WITH CONTRAST TECHNIQUE: Multidetector CT imaging of the abdomen and pelvis was performed using the standard protocol following bolus administration of intravenous contrast. CONTRAST:  116m ISOVUE-300 IOPAMIDOL (ISOVUE-300) INJECTION 61% COMPARISON:  04/27/2018 CT abdomen/pelvis.  04/26/2018 MRI abdomen. FINDINGS: Lower chest: No significant pulmonary nodules or acute consolidative airspace disease. 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 No new liver masses. Stable caudate lobe hypertrophy without definite liver surface irregularity. Hyperdense material in fundal gallbladder is compatible with sludge or tiny stones. No gallbladder wall thickening or significant pericholecystic fluid. Mild diffuse intrahepatic biliary ductal dilatation, most prominent in the left liver lobe, with relative sparing of caudate and right liver lobe not convincingly changed back to 01/12/2017 MRI. Stable top-normal CBD diameter 6 mm. Pancreas: Normal, with no mass or duct dilation. Spleen: Normal size. No mass. Adrenals/Urinary Tract: Normal adrenals. Normal kidneys with no hydronephrosis and no renal  mass. Collapsed normal bladder. Stomach/Bowel: Normal non-distended stomach. There are several mildly dilated small bowel loops throughout the abdomen with air-fluid levels measuring up to 3.8 cm diameter, not appreciably changed. Some of these dilated small bowel loops demonstrate mild small bowel wall thickening, mildly increased. No focal small bowel caliber  transition. No definite pneumatosis. Appendix is mildly dilated (10 mm diameter), mildly increased. Borderline mild diffuse appendiceal wall thickening and hyperenhancement with mild periappendiceal fluid. Fluid levels and mild gas scattered throughout the right and transverse colon. No large bowel wall thickening or diverticular disease. Vascular/Lymphatic: Normal caliber abdominal aorta. 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 Patent splenic vein. 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 Patent renal veins. No pathologically enlarged lymph nodes in the abdomen or pelvis. Reproductive: Normal size prostate. Other: No pneumoperitoneum.  Small volume pelvic ascites. Musculoskeletal: No aggressive appearing focal osseous lesions. IMPRESSION: 1. 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 2. 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 3. Persistent mild dilatation of small bowel loops throughout the abdomen with air-fluid levels. Increased mild small bowel wall thickening. No definite pneumatosis. Small bowel ischemia cannot be excluded. No free air. 4. Nonspecific dilatation and mild wall thickening in the appendix, mildly increased. 5. Small volume pelvic ascites. 6. Chronic mild intrahepatic biliary ductal dilatation is unchanged back to 2018 MRI studies. No acute biliary abnormality. These results were called by telephone at the time of interpretation on 04/29/2018 at 12:48 pm to Dr. Doreatha Lew , who verbally acknowledged these results. Electronically Signed   By: Ilona Sorrel M.D.   On: 04/29/2018 12:54    Anti-infectives: Anti-infectives (From admission, onward)   None       Assessment/Plan Graves disease Hypothyroidism GERD HLD  Abdominal pain SMV thrombosis - CT scan 7/13 concern for  possible appendicitis, but clinically does not seem to have appendicitis - ileus on xray but clinically that seems to be resolving as he is having bowel function - CT yesterday showed acute SMV thrombosis with chronic main portal vein occlusion - started on heparin yesterday - AST/ALT 16/15, Tbili 2.8 from 1.7 yesterday - continue to follow Hypokalemia - K 3.1, recommend PO replacement  ID - none FEN - CLD and ADAT VTE - SCDs, heparin gtt Foley - none  -No signs of intestinal ischemia. Continue anticoagulation -Hx of ?PSC - trend LFTs, Tbili back down to 1.3 today; appears to have indirect hyperbilirubinemia. AST/ALT/Alk phos are normal; GI following; do not think any of this is related to his gallbladder sludge -I have additionally spoken to vascular surgery on call yesterday about the case whom wasn't aware of any other necessary treatment options at this time -Hematology for hypercoagulability workup - may benefit from this being done as an outpatient and through the acute event but will defer to them   LOS: 3 days   Ileana Roup , [facility] 05/01/2018, 9:18 AM

## 2018-05-01 NOTE — Progress Notes (Signed)
PROGRESS NOTE [facility]   Arek Moening   MRN:[id number] DOB: 04/20/1991  DOA: 04/26/2018 PCP: McKeown, William, MD   Brief Narrative:   Kyle Gonzales 27-year-old male with history of Graves' disease, PSC, anxiety and significant depression presented to the emergency department complaining of epigastric pain for 3 days prior to admission. 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 ESR elevated and gallbladder sludge without acute cholecystitis.  CT also showed questionable inflamed appendix.  Surgery was consulted and felt that this is not acute appendicitis. Patient was admitted with working diagnosis of intractable abdominal pain with nausea and vomiting.  GI was consulted suspect mesenteric adenitis versus PSC started on trial of prednisone.  Repeated CT scan shows acute SMV thrombosis patient started on heparin drip  Subjective: Patient ports is feeling better, no nausea, tolerating clear liquid diet, abdominal pain has improved.  Assessment & Plan:   SMV thrombus -CT scan on 713 showing concern for possible appendicitis, but clinically with no evidence of appendicitis, general surgery input greatly appreciated. -GI input greatly appreciated -Initially on heparin GTT, I we will transition to oral Eliquis today, I have discussed with oncology Dr. Kale, as an outpatient, hypercoagulable work-up still pending, he will follow with hematology about final results as an outpatient. - Patient has hx of chronic portal thrombosis. Hypercoag w/u pending. ANA and PANCA pending    Abdominal pain Secondary to acute SMV thrombosis Inflamed appendix, surgery following an feels this is not acute appendicitis.   ? Small duct PSC, however prednisone not helping.  Ileus - clinically resolved  Ambulation, avoid narcotics, continue MiraLAX,  advancing to full liquid diet today  Hypertension BP stable  Continue home medication, continue to monitor BP closely  Hypothyroidism (Grave's)  Continue Synthroid  DVT prophylaxis: SCDs, ambulation Code Status: Full code Family Communication: Discussed with mother at bedside Disposition Plan: Home in 1-2 days if tolerating diet   Consultants:   GI  General surgery  Procedures:   None  Antimicrobials:  None   Objective: Vitals:   04/30/18 1449 04/30/18 2215 05/01/18 0409 05/01/18 1502  BP: (!) 112/59 138/75 (!) 144/91 (!) 106/94  Pulse: 68 84 69 74  Resp: 17 18 18 16  Temp: 98.9 F (37.2 C) 98.1 F (36.7 C) 98.7 F (37.1 C) 98.1 F (36.7 C)  TempSrc:    Oral  SpO2: 100% 100% 99% 97%  Weight:      Height:       No intake or output data in the 24 hours ending 05/01/18 1514 Filed Weights   04/27/18 0257  Weight: 100.4 kg (221 lb 5.5 oz)    Examination:  Awake Alert, Oriented X 3, No new F.N deficits, Normal affect Symmetrical Chest wall movement, Good air movement bilaterally, CTAB RRR,No Gallops,Rubs or new Murmurs, No Parasternal Heave +ve B.Sounds, Abd Soft, No tenderness, No rebound - guarding or rigidity. No Cyanosis, Clubbing or edema, No new Rash or bruise     Data Reviewed: I have personally reviewed following labs and imaging studies  CBC: Recent Labs  Lab 04/26/18 1125 04/26/18 1843 04/27/18 0522 04/28/18 0549 04/29/18 0850 04/30/18 0624 05/01/18 0615  WBC 11.2* 9.0 5.8 7.3 4.5 4.4 5.7  NEUTROABS 10,170* 8.2*  --  6.3  --   --   --   HGB 14.7 14.2 12.8* 12.5* 12.5* 12.9* 11.6*  HCT 42.3   41.7 38.6* 37.7* 37.4* 38.7* 34.3*  MCV 84.3 88.0 89.1 88.1 88.0 87.4 86.6  PLT 89* 61* 67* 101* 123* 115* 112*   Basic Metabolic Panel: Recent Labs  Lab 04/26/18 1125  04/27/18 0522 04/28/18 0549 04/29/18 0850 04/30/18 0624 05/01/18 0615  NA 138   < > 139 138 137 139 139  K 3.8   < > 3.5 3.4* 3.1* 3.1* 3.0*  CL 98   < > 101 100 99 100  101  CO2 30   < > 28 29 28 25 27  GLUCOSE 114*   < > 110* 131* 122* 109* 118*  BUN 11   < > 11 9 16 15 9  CREATININE 1.10   < > 1.00 0.90 0.93 1.01 0.81  CALCIUM 10.0   < > 9.3 9.1 8.9 9.3 8.7*  MG 2.0  --  2.0 2.0 2.1  --  2.0  PHOS  --   --  2.2* 3.3  --   --   --    < > = values in this interval not displayed.   GFR: Estimated Creatinine Clearance: 166.6 mL/min (by C-G formula based on SCr of 0.81 mg/dL). Liver Function Tests: Recent Labs  Lab 04/26/18 1843 04/27/18 0522 04/29/18 0850 04/30/18 0624 05/01/18 0615  AST 20 15 13* 16 17  ALT 14 12 14 15 20  ALKPHOS 64 62 56 64 67  BILITOT 1.1 1.0 1.7* 2.8* 1.3*  PROT 8.0 7.4 6.9 7.9 6.4*  ALBUMIN 4.2 3.6 3.2* 3.8 3.0*   Recent Labs  Lab 04/24/18 2137 04/26/18 1843  LIPASE 25 20   No results for input(s): AMMONIA in the last 168 hours. Coagulation Profile: Recent Labs  Lab 04/24/18 2249  INR 1.14   Cardiac Enzymes: Recent Labs  Lab 04/27/18 0120  TROPONINI <0.03   BNP (last 3 results) No results for input(s): PROBNP in the last 8760 hours. HbA1C: No results for input(s): HGBA1C in the last 72 hours. CBG: No results for input(s): GLUCAP in the last 168 hours. Lipid Profile: No results for input(s): CHOL, HDL, LDLCALC, TRIG, CHOLHDL, LDLDIRECT in the last 72 hours. Thyroid Function Tests: No results for input(s): TSH, T4TOTAL, FREET4, T3FREE, THYROIDAB in the last 72 hours. Anemia Panel: No results for input(s): VITAMINB12, FOLATE, FERRITIN, TIBC, IRON, RETICCTPCT in the last 72 hours. Sepsis Labs: Recent Labs  Lab 04/27/18 0120  LATICACIDVEN 1.0    No results found for this or any previous visit (from the past 240 hour(s)).    Radiology Studies: No results found.  Scheduled Meds: . apixaban  10 mg Oral BID   Followed by  . [START ON 05/08/2018] apixaban  5 mg Oral BID  . bisoprolol-hydrochlorothiazide  1 tablet Oral Daily  . dextromethorphan-guaiFENesin  1 tablet Oral BID  . famotidine  20 mg  Oral BID  . fluticasone  1 spray Each Nare Daily  . levothyroxine  75 mcg Oral QAC breakfast  . polyethylene glycol  17 g Oral Daily  . potassium chloride  40 mEq Oral Q4H  . sertraline  100 mg Oral QHS  . vitamin C  500 mg Oral Daily   Continuous Infusions:    LOS: 3 days      , MD Pager: [phone]  If 7PM-7AM, please contact night-coverage [email or web address] 05/01/2018, 3:14 PM   

## 2018-05-02 DIAGNOSIS — E039 Hypothyroidism, unspecified: Secondary | ICD-10-CM

## 2018-05-02 LAB — CBC
HCT: 34.9 % — ABNORMAL LOW (ref 39.0–52.0)
Hemoglobin: 11.7 g/dL — ABNORMAL LOW (ref 13.0–17.0)
MCH: 29.3 pg (ref 26.0–34.0)
MCHC: 33.5 g/dL (ref 30.0–36.0)
MCV: 87.3 fL (ref 78.0–100.0)
PLATELETS: 146 10*3/uL — AB (ref 150–400)
RBC: 4 MIL/uL — ABNORMAL LOW (ref 4.22–5.81)
RDW: 14 % (ref 11.5–15.5)
WBC: 8.6 10*3/uL (ref 4.0–10.5)

## 2018-05-02 LAB — DRVVT MIX: DRVVT MIX: 47.4 s — AB (ref 0.0–47.0)

## 2018-05-02 LAB — LUPUS ANTICOAGULANT PANEL
DRVVT: 60.4 s — ABNORMAL HIGH (ref 0.0–47.0)
PTT Lupus Anticoagulant: 41.1 s (ref 0.0–51.9)

## 2018-05-02 LAB — COMPREHENSIVE METABOLIC PANEL
ALT: 35 U/L (ref 0–44)
AST: 28 U/L (ref 15–41)
Albumin: 3.3 g/dL — ABNORMAL LOW (ref 3.5–5.0)
Alkaline Phosphatase: 130 U/L — ABNORMAL HIGH (ref 38–126)
Anion gap: 9 (ref 5–15)
BUN: 8 mg/dL (ref 6–20)
CHLORIDE: 102 mmol/L (ref 98–111)
CO2: 26 mmol/L (ref 22–32)
Calcium: 8.8 mg/dL — ABNORMAL LOW (ref 8.9–10.3)
Creatinine, Ser: 0.83 mg/dL (ref 0.61–1.24)
GFR calc Af Amer: >60 mL/min (ref >60)
GFR calc non Af Amer: >60 mL/min (ref >60)
Glucose, Bld: 142 mg/dL — ABNORMAL HIGH (ref 70–99)
POTASSIUM: 2.9 mmol/L — AB (ref 3.5–5.1)
Sodium: 137 mmol/L (ref 135–145)
Total Bilirubin: 1.7 mg/dL — ABNORMAL HIGH (ref 0.3–1.2)
Total Protein: 7 g/dL (ref 6.5–8.1)

## 2018-05-02 LAB — PROTEIN C ACTIVITY: Protein C Activity: 109 % (ref 73–180)

## 2018-05-02 LAB — PROTEIN S ACTIVITY: Protein S Activity: 26 % — ABNORMAL LOW (ref 63–140)

## 2018-05-02 LAB — PROTEIN S, TOTAL: PROTEIN S AG TOTAL: 55 % — AB (ref 60–150)

## 2018-05-02 LAB — DRVVT CONFIRM: DRVVT CONFIRM: 1.3 ratio — AB (ref 0.8–1.2)

## 2018-05-02 MED ORDER — OXYCODONE HCL 5 MG PO TABS: 2.5000 mg | ORAL_TABLET | ORAL | 0 refills | Status: DC | PRN

## 2018-05-02 MED ORDER — APIXABAN 5 MG PO TABS: ORAL_TABLET | ORAL | 0 refills | Status: DC

## 2018-05-02 MED ORDER — POTASSIUM CHLORIDE CRYS ER 20 MEQ PO TBCR: 20.0000 meq | EXTENDED_RELEASE_TABLET | Freq: Every day | ORAL | 0 refills | Status: DC

## 2018-05-02 MED ORDER — BISACODYL 10 MG RE SUPP: 10.0000 mg | Freq: Three times a day (TID) | RECTAL | 0 refills | Status: DC | PRN

## 2018-05-02 MED ORDER — POTASSIUM CHLORIDE CRYS ER 20 MEQ PO TBCR: 40.0000 meq | EXTENDED_RELEASE_TABLET | Freq: Four times a day (QID) | ORAL | Status: DC

## 2018-05-02 MED ORDER — POTASSIUM CHLORIDE CRYS ER 20 MEQ PO TBCR
30.0000 meq | EXTENDED_RELEASE_TABLET | ORAL | Status: DC
  Filled 2018-05-02: qty 1

## 2018-05-02 MED ORDER — VITAMIN D3 125 MCG (5000 UT) PO CAPS: 5000.0000 [IU] | ORAL_CAPSULE | ORAL | Status: AC

## 2018-05-02 MED ORDER — POTASSIUM CHLORIDE CRYS ER 10 MEQ PO TBCR: 40.0000 meq | EXTENDED_RELEASE_TABLET | ORAL | Status: DC

## 2018-05-02 MED ORDER — POTASSIUM CHLORIDE 20 MEQ/15ML (10%) PO SOLN
40.0000 meq | ORAL | Status: DC
  Administered 2018-05-02: 40 meq via ORAL
  Filled 2018-05-02: qty 30

## 2018-05-02 NOTE — Discharge Summary (Signed)
Kyle Gonzales, is a 27 y.o. male  DOB 04/25/1991  MRN [id number].  Admission date:  04/26/2018  Admitting Physician  Toy Baker, MD  Discharge Date:  05/02/2018   Primary MD  [name], MD  Recommendations for primary care physician for things to follow:  -Please check CBC, BMP during next visit.  Admission Diagnosis  Abdominal pain, unspecified abdominal location [R10.9] Chest pain, unspecified type [R07.9]   Discharge Diagnosis  Abdominal pain, unspecified abdominal location [R10.9] Chest pain, unspecified type [R07.9]  Principal Problem:   Abdominal pain Active Problems:   Hypothyroidism   Hyperlipidemia, mixed   Drug addiction / Mar[place]ana ([place])   Non-compliance   MDD (major depressive disorder), recurrent severe, without psychosis ([place])   Hypertension   Primary sclerosing cholangitis   Superior mesenteric vein thrombosis   Ileus ([place])      Past Medical History:  Diagnosis Date  . GERD (gastroesophageal reflux disease)   . Graves disease 08/18/2015  . Hyperlipidemia   . Hypothyroid   . Palpitations   . Vitamin D deficiency     Past Surgical History:  Procedure Laterality Date  . ANTERIOR CRUCIATE LIGAMENT REPAIR  2012  . WISDOM TOOTH EXTRACTION         History of present illness and  Hospital Course:     Kindly see H&P for history of present illness and admission details, please review complete Labs, Consult reports and Test reports for all details in brief  HPI  from the history and physical done on the day of admission 04/27/2018 Kyle Gonzales is a 27 y.o. male with medical history significant of narcotic abuse, esophagitis, HTN, medical noncompliance the prediabetes,  GERD and recurrent dyspepsia     Presented with  Severe abdominal/chest pain epigastric pain for the past 3 days. First felt like esophagitis. But now feels different feels like severe  abdominal pain worse with ambulation associated with chills no fever, normal BM yesterday. This feels different from prior.  Pain is worse with movement felt like reflux esophagitis he seen his PCP today who recommended continuation of omeprazole Carafate given prescription for Zofran he denies any current nausea or diarrhea no fevers or chills has recurrent abdominal pain similar to prior and has been followed by GI otherwise no shortness of breath no cough. Has been seen in the ER for significant dyspepsia on 26 June was given prescription for Carafate and Pepcid to be taken in addition to his omeprazole told to stop NSAIDs and follow-up with GI.  He came back on 10 July reporting epigastric pain waxing and waning severity more constant some associated nausea but nonbilious nonbloody. At that time LFTs were reassuring normal lipase ultrasound of abdomen was unremarkable electrolytes within normal limits was managed symptomatically in the ER with improvement of his pain.  Of note patient has not been taking his Carafate secondary to high cost Regarding pertinent Chronic problems: evaluated by Dr Havery Moros for several year hx/o transaminitis now suspect for occult Primary sclerosing Cholangitis ([place]).  Last MRCP was  in February 2019  has history of Graves' Dz treated by RAI-131 in Nov 2016 and begun on thyroid replacement in Feb 2017 Chronic anxiety on benzodiazepines Upper lipidemia not on statin secondary to underlying abnormal liver functions While in ER: Abdominal pain uncontrolled despite 100 of fentanyl x2 Patient continues to have significant abdominal pain MRI of the abdomen was done showed mild small bowel dilatation and ascites Could not rule out ileus or peritonitis due to findings of mild ascites After MRI was done the pain has resolved spontaneously but then he developed right clavicle pain. Abdominal ultrasound showing gallbladder sludge chronic cavernous transformation of portal vein  and chronic biliary diet dilatation secondary to known sclerosing cholangitis Patient states he cannot go home and wanted to be admitted.     Hospital Course   Kyle Gonzales 27 year old male with history of Graves' disease, PSC, anxiety and significant depression presented to the emergency department complaining of epigastric pain for 3 days prior to admission. Has been evaluated by GI for primary sclerosing cholangitis, had normal LFTs, ultrasound, CT scan and MRI of the abdomen showed stable biliary duct dilation/cavernous transformation of portal vein. ESR elevated and gallbladder sludge without acute cholecystitis. CT also showed questionable inflamed appendix. Surgery was consulted and felt that this is not acute appendicitis. Patient was admitted with working diagnosis of intractable abdominal pain with nausea and vomiting.  GI was consulted suspect mesenteric adenitis versus PSC started on trial of prednisone.  Repeated CT scan shows acute SMV thrombosis patient started on heparin drip, he was transitioned to Eliquis, abdominal pain significantly subsided, no further nausea or vomiting, tolerating soft diet.    SMV thrombus -CT scan on 713 showing concern for possible appendicitis, but clinically with no evidence of appendicitis, general surgery input greatly appreciated. -Initially on heparin GTT,transition to oral Eliquis, I have discussed with oncology Dr. Irene Limbo, he will follow with him as an outpatient, hypercoagulable work-up still pending, he will follow with hematology about final results as an outpatient.  So far protein S total, protein is activity, homocystine, are slightly abnormal.  Abdominal pain - Secondary to acute SMV thrombosis - Inflamed appendix, surgery following an feels this is not acute appendicitis.   -Significantly improved by the time of discharge, he was given prescription for total of 15 tablets of oxycodone  Ileus - clinically resolved   Possible small  duct ZWC:HENIDPOE ultrasound. -he has appointment scheduled with GI on 05/14/2018  Hypertension BP stable  Continue home medication, continue to monitor BP closely  Hypothyroidism (Grave's)  Continue Synthroid  Hypokalemia -Repleted, potassium is 2.9 today, but he did receive total of 120 Meq p.o. today, will discharge on supplement for next 15 days.     Discharge Condition:  Stable   Follow UP  Follow-up Information    Schedule an appointment as soon as possible for a visit  with [name], MD.   Specialty:  Internal Medicine Why:  As needed Contact information: [address] [place] [address] [phone]        Sheron Nightingale, MD Follow up.   Specialties:  Hematology, Oncology Why:  you will be called with an appointment Contact information: [place] [address] [phone]             Discharge Instructions  and  Discharge Medications    Discharge Instructions    Discharge instructions   Complete by:  As directed    Follow with Primary MD [name], MD  in 7 days   Get CBC, CMP,  checked  by Primary MD next visit.    Activity: As tolerated with Full fall precautions use walker/cane & assistance as needed   Disposition Home    Diet: Regular diet On your next visit with your primary care physician please Get Medicines reviewed and adjusted.   Please request your Prim.MD to go over all Hospital Tests and Procedure/Radiological results at the follow up, please get all Hospital records sent to your Prim MD by signing hospital release before you go home.   If you experience worsening of your admission symptoms, develop shortness of breath, life threatening emergency, suicidal or homicidal thoughts you must seek medical attention immediately by calling 911 or calling your MD immediately  if symptoms less severe.  You Must read complete instructions/literature along with all the  possible adverse reactions/side effects for all the Medicines you take and that have been prescribed to you. Take any new Medicines after you have completely understood and accpet all the possible adverse reactions/side effects.   Do not drive, operating heavy machinery, perform activities at heights, swimming or participation in water activities or provide baby sitting services if your were admitted for syncope or siezures until you have seen by Primary MD or a Neurologist and advised to do so again.  Do not drive when taking Pain medications.    Do not take more than prescribed Pain, Sleep and Anxiety Medications  Special Instructions: If you have smoked or chewed Tobacco  in the last 2 yrs please stop smoking, stop any regular Alcohol  and or any Recreational drug use.  Wear Seat belts while driving.   Please note  You were cared for by a hospitalist during your hospital stay. If you have any questions about your discharge medications or the care you received while you were in the hospital after you are discharged, you can call the unit and asked to speak with the hospitalist on call if the hospitalist that took care of you is not available. Once you are discharged, your primary care physician will handle any further medical issues. Please note that NO REFILLS for any discharge medications will be authorized once you are discharged, as it is imperative that you return to your primary care physician (or establish a relationship with a primary care physician if you do not have one) for your aftercare needs so that they can reassess your need for medications and monitor your lab values.   Increase activity slowly   Complete by:  As directed      Allergies as of 05/02/2018      Reactions   Tylenol [acetaminophen]    r/t elevated LFTs      Medication List    STOP taking these medications   metoCLOPramide 10 MG tablet Commonly known as:  REGLAN     TAKE these medications   apixaban 5  MG Tabs tablet Commonly known as:  ELIQUIS Take 10 mg (2 tablets) oral twice daily for next 5 days, then transition to 5 mg (1 tablet) oral twice daily from 05/08/2018   bisacodyl 10 MG suppository Commonly known as:  DULCOLAX Place 1 suppository (10 mg total) rectally every 8 (eight) hours as needed for moderate constipation.   bisoprolol-hydrochlorothiazide 10-6.25 MG tablet Commonly known as:  ZIAC Take 1 tablet by mouth daily.   ezetimibe 10 MG tablet Commonly known as:  ZETIA Take 1 tablet daily for Cholesterol   famotidine 20 MG tablet Commonly known as:  PEPCID Take 1 tablet (20 mg total) by mouth 2 (two) times daily.   hydrOXYzine 25 MG tablet Commonly known as:  ATARAX/VISTARIL Take 1 tablet (25 mg total) by mouth 3 (three) times daily as needed for anxiety.   levothyroxine 75 MCG tablet Commonly known as:  SYNTHROID, LEVOTHROID take 1 tablet by mouth every morning ON AN EMPTY STOMACH   omeprazole 40 MG capsule Commonly known as:  PRILOSEC Take 1 capsule (40 mg total) by mouth daily. Please schedule an Office visit for future refills: [phone]   ondansetron 8 MG tablet Commonly known as:  ZOFRAN Take 1 tablet 3 x/ day before meals - Nausea   oxyCODONE 5 MG immediate release tablet Commonly known as:  Oxy IR/ROXICODONE Take 0.5 tablets (2.5 mg total) by mouth every 4 (four) hours as needed for severe pain.   potassium chloride SA 20 MEQ tablet Commonly known as:  K-DUR,KLOR-CON Take 1 tablet (20 mEq total) by mouth daily.   sertraline 100 MG tablet Commonly known as:  ZOLOFT Take 1 tablet (100 mg total) by mouth at bedtime.   sucralfate 1 g tablet Commonly known as:  CARAFATE Take 1 tablet (1 g total) by mouth 2 (two) times daily.   traZODone 50 MG tablet Commonly known as:  DESYREL Take 1 tablet (50 mg total) by mouth at bedtime as needed for sleep.   Vitamin D3 5000 units Caps Take 1 capsule (5,000 Units total) by mouth every other day. What  changed:    how much to take  when to take this         Diet and Activity recommendation: See Discharge Instructions above   Consults obtained -  GI General Surgery   Major procedures and Radiology Reports - PLEASE review detailed and final reports for all details, in brief -   Mr Abdomen W Or Wo Contrast  Result Date: 04/26/2018 CLINICAL DATA:  Followup cholecystitis and cholangitis. History of primary sclerosing cholangitis. EXAM: MRI ABDOMEN WITHOUT AND WITH CONTRAST TECHNIQUE: Multiplanar multisequence MR imaging of the abdomen was performed both before and after the administration of intravenous contrast. CONTRAST:  29m MULTIHANCE GADOBENATE DIMEGLUMINE 529 MG/ML IV SOLN COMPARISON:  Ultrasound 04/24/2018.  MRI 11/20/2017 and 02/13/2017. FINDINGS: Lower chest:  The visualized lower chest appears unremarkable. Hepatobiliary: Typical hemangiomas are again noted within the right hepatic lobe, measuring 2.0 cm on image 15/5 and 13 mm on image 25/5. These demonstrate typical enhancement. There are no suspicious hepatic findings. There is stable mild intrahepatic biliary dilatation and irregularity consistent with sclerosing cholangitis. There is dependent sludge within the gallbladder. No evidence of choledocholithiasis or extrahepatic biliary dilatation. 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 Pancreas: Unremarkable. No pancreatic ductal dilatation or surrounding inflammatory changes. Spleen: Normal in size without focal abnormality. Adrenals/Urinary Tract: Both adrenal glands appear normal. The kidneys appear normal without evidence of urinary tract calculus or hydronephrosis. Bladder not imaged. Stomach/Bowel: The stomach appears decompressed. There are multiple mildly dilated loops of small bowel in the mid abdomen. The visualized colon appears normal in caliber. Vascular/Lymphatic: There are no enlarged abdominal lymph nodes. No acute vascular findings are seen.  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 Other: There is a small amount of ascites which appears new. No focal extraluminal fluid collection seen. Musculoskeletal: No acute or significant osseous findings. IMPRESSION: 1. Mild small bowel dilatation and ascites. Findings could be secondary to ileus and peritonitis. Radiographic follow up recommended to exclude early obstruction. 2. Stable  appearance of the biliary system consistent with sclerosing cholangitis. No significant biliary dilatation or evidence of choledocholithiasis. Mild gallbladder sludge. 3. 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 4. Stable hepatic hemangiomas. Electronically Signed   By: Richardean Sale M.D.   On: 04/26/2018 23:15   US Abdomen Complete  Result Date: 04/25/2018 CLINICAL DATA:  Abdominal pain today. History of primary sclerosing cholangitis. EXAM: ABDOMEN ULTRASOUND COMPLETE COMPARISON:  MRI of the liver November 20, 2017. FINDINGS: Gallbladder: Layering echogenic gallbladder sludge without discrete gallstone. No pericholecystic fluid. No gallbladder wall thickening. No sonographic Murphy sign elicited. Common bile duct: Diameter: 4 mm. Liver: Echogenic hepatic lesions measuring to 2.7 cm previously characterized as hemangiomas. Mildly prominent intrahepatic biliary dilatation, unchanged. Within normal limits in parenchymal echogenicity. Portal vein is patent on color Doppler imaging with normal direction of blood flow towards the liver. 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 Portal vein is not discretely identified. IVC: No abnormality visualized. Pancreas: Visualized portion unremarkable. Spleen: Size and appearance within normal limits. Right Kidney: Length: 12 cm. Echogenicity within normal limits. No mass or hydronephrosis visualized. Left Kidney: Length: 12.1 cm. Echogenicity within normal limits. No mass or hydronephrosis visualized. Abdominal aorta: No aneurysm visualized. Other findings:  None. IMPRESSION: 1. Gallbladder sludge without sonographic findings of acute cholecystitis. 2. Chronic cavernous transformation of portal vein. 3. Chronic biliary duct dilatation consistent with known sclerosing cholangitis. Electronically Signed   By: Elon Alas M.D.   On: 04/25/2018 00:39   Ct Abdomen Pelvis W Contrast  Result Date: 04/29/2018 CLINICAL DATA:  Inpatient. Generalized mid upper abdominal pain. 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 EXAM: CT ABDOMEN AND PELVIS WITH CONTRAST TECHNIQUE: Multidetector CT imaging of the abdomen and pelvis was performed using the standard protocol following bolus administration of intravenous contrast. CONTRAST:  159m ISOVUE-300 IOPAMIDOL (ISOVUE-300) INJECTION 61% COMPARISON:  04/27/2018 CT abdomen/pelvis.  04/26/2018 MRI abdomen. FINDINGS: Lower chest: No significant pulmonary nodules or acute consolidative airspace disease. 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 No new liver masses. Stable caudate lobe hypertrophy without definite liver surface irregularity. Hyperdense material in fundal gallbladder is compatible with sludge or tiny stones. No gallbladder wall thickening or significant pericholecystic fluid. Mild diffuse intrahepatic biliary ductal dilatation, most prominent in the left liver lobe, with relative sparing of caudate and right liver lobe not convincingly changed back to 01/12/2017 MRI. Stable top-normal CBD diameter 6 mm. Pancreas: Normal, with no mass or duct dilation. Spleen: Normal size. No mass. Adrenals/Urinary Tract: Normal adrenals. Normal kidneys with no hydronephrosis and no renal mass. Collapsed normal bladder. Stomach/Bowel: Normal non-distended stomach. There are several mildly dilated small bowel loops throughout the abdomen with air-fluid levels measuring up to  3.8 cm diameter, not appreciably changed. Some of these dilated small bowel loops demonstrate mild small bowel wall thickening, mildly increased. No focal small bowel caliber transition. No definite pneumatosis. Appendix is mildly dilated (10 mm diameter), mildly increased. Borderline mild diffuse appendiceal wall thickening and hyperenhancement with mild periappendiceal fluid. Fluid levels and mild gas scattered throughout the right and transverse colon. No large bowel wall thickening or diverticular disease. Vascular/Lymphatic: Normal caliber abdominal aorta. 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 Patent splenic vein. 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 Patent renal veins. No pathologically enlarged lymph nodes in the abdomen or  pelvis. Reproductive: Normal size prostate. Other: No pneumoperitoneum.  Small volume pelvic ascites. Musculoskeletal: No aggressive appearing focal osseous lesions. IMPRESSION: 1. 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 2. 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 3. Persistent mild dilatation of small bowel loops throughout the abdomen with air-fluid levels. Increased mild small bowel wall thickening. No definite pneumatosis. Small bowel ischemia cannot be excluded. No free air. 4. Nonspecific dilatation and mild wall thickening in the appendix, mildly increased. 5. Small volume pelvic ascites. 6. Chronic mild intrahepatic biliary ductal dilatation is unchanged back to 2018 MRI studies. No acute biliary abnormality. These results were called by telephone at the time of interpretation on 04/29/2018 at 12:48 pm to Dr. Doreatha Lew , who verbally acknowledged these results. Electronically Signed   By: Ilona Sorrel M.D.   On: 04/29/2018 12:54   Ct Abdomen Pelvis W  Contrast  Result Date: 04/27/2018 CLINICAL DATA:  Abdominal pain with chills but no fever for 3 days. Bowel dilatation and ascites on MRCP. History of sclerosing cholangitis. EXAM: CT ABDOMEN AND PELVIS WITH CONTRAST TECHNIQUE: Multidetector CT imaging of the abdomen and pelvis was performed using the standard protocol following bolus administration of intravenous contrast. CONTRAST:  110m ISOVUE-300 IOPAMIDOL (ISOVUE-300) INJECTION 61% COMPARISON:  MRI 04/26/2018 and 11/20/2017. FINDINGS: Lower chest: Mild linear atelectasis at both lung bases. No significant pleural or pericardial effusion. Hepatobiliary: There are known hemangiomas within the right hepatic lobe which are stable. There is stable mild intrahepatic biliary dilatation and irregularity consistent with sclerosing cholangitis. Dependent high-density sludge is present in the gallbladder lumen. There is no extrahepatic biliary dilatation. Pancreas: Unremarkable. No pancreatic ductal dilatation or surrounding inflammatory changes. Spleen: Normal in size without focal abnormality. Adrenals/Urinary Tract: Both adrenal glands appear normal. Both kidneys appear normal. No evidence of urinary tract calculus or hydronephrosis. There is contrast material within the bladder. No evidence of bladder wall thickening. Stomach/Bowel: The initial portal phase images are motion degraded (due to patient vomiting) and no enteric contrast was administered. Delayed imaging was performed through the pelvis. No significant small or large bowel wall thickening, distention or surrounding inflammation. Because of these limitations, confident identification of the appendix is limited, although there is an apparent blind ending tubular structure extending into the midline pelvis which measures up to 11 mm in diameter on image 65/4. This is also seen on coronal images 59 and 60 of series 9. Vascular/Lymphatic: There are no enlarged abdominal or pelvic lymph nodes. No acute  vascular findings. 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 Reproductive: The prostate gland and seminal vesicles appear normal. Other: Small amount of ascites, predominately in the pelvis and subhepatic space. No focal extraluminal fluid collection. There is mild mesenteric edema. Musculoskeletal: No acute or significant osseous findings. IMPRESSION: 1. Study is limited by motion and the lack of enteric contrast. There is possible inflammation of the appendix in the low pelvis, and acute appendicitis cannot be excluded. Surgical evaluation recommended. 2. There is mild ascites and nonspecific mesenteric edema. No focal extraluminal fluid collection or bowel obstruction identified. 3. Stable appearance of the liver with mild intrahepatic biliary dilatation and irregularity attributed to sclerosing cholangitis. 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 4. These results were called by telephone at the time of interpretation on 04/27/2018 at 3:02 am to the patient's nurse MCorliss Blacker who verbally acknowledged these results. Electronically Signed   By: WRichardean SaleM.D.   On: 04/27/2018 03:02  Dg Abd Portable 1v  Result Date: 04/28/2018 CLINICAL DATA:  Evaluate ileus. EXAM: PORTABLE ABDOMEN - 1 VIEW COMPARISON:  04/27/2018. FINDINGS: Diffuse gaseous distension of the large and small bowel loops are identified. This appears increased from previous exam with increase in diameter of small bowel loops. IMPRESSION: 1. Interval progression of ileus pattern. Electronically Signed   By: Kerby Moors M.D.   On: 04/28/2018 07:55    Micro Results     No results found for this or any previous visit (from the past 240 hour(s)).     Today   Subjective:   Kyle Gonzales today has no headache,no chest or abdominal pain,no new weakness tingling or numbness, feels much better wants to go home today. Tolerating diet, no N/V.  Objective:   Blood pressure 126/78, pulse 61, temperature  98.4 F (36.9 C), temperature source Oral, resp. rate 17, height _0  (1.905 m), weight 100.4 kg (221 lb 5.5 oz), SpO2 100 %.   Intake/Output Summary (Last 24 hours) at 05/02/2018 1443 Last data filed at 05/02/2018 0834 Gross per 24 hour  Intake 120 ml  Output -  Net 120 ml    Exam Awake Alert, Oriented x 3, No new F.N deficits, Normal affect Symmetrical Chest wall movement, Good air movement bilaterally, CTAB RRR,No Gallops,Rubs or new Murmurs, No Parasternal Heave +ve B.Sounds, Abd Soft, Non tender, No rebound -guarding or rigidity. No Cyanosis, Clubbing or edema, No new Rash or bruise  Data Review   CBC w Diff:  Lab Results  Component Value Date   WBC 8.6 05/02/2018   HGB 11.7 (L) 05/02/2018   HCT 34.9 (L) 05/02/2018   PLT 146 (L) 05/02/2018   LYMPHOPCT 6 04/28/2018   MONOPCT 7 04/28/2018   EOSPCT 0 04/28/2018   BASOPCT 0 04/28/2018    CMP:  Lab Results  Component Value Date   NA 137 05/02/2018   K 2.9 (L) 05/02/2018   CL 102 05/02/2018   CO2 26 05/02/2018   BUN 8 05/02/2018   CREATININE 0.83 05/02/2018   CREATININE 1.10 04/26/2018   PROT 7.0 05/02/2018   ALBUMIN 3.3 (L) 05/02/2018   BILITOT 1.7 (H) 05/02/2018   ALKPHOS 130 (H) 05/02/2018   AST 28 05/02/2018   ALT 35 05/02/2018  .   Total Time in preparing paper work, data evaluation and todays exam - 42 minutes  Phillips Climes M.D on 05/02/2018 at 2:43 PM  [facility]   Office  [phone]

## 2018-05-02 NOTE — Progress Notes (Signed)
[facility] Progress Note     Subjective: CC: abdominal pain Abdominal discomfort resolved per pt. Tolerating full liquids with n/v. Having flatus and BMs  Objective: Vital signs in last 24 hours: Temp:  [98.1 F (36.7 C)-98.4 F (36.9 C)] 98.4 F (36.9 C) (07/18 0403) Pulse Rate:  [61-79] 61 (07/18 0403) Resp:  [16-18] 17 (07/18 0403) BP: (106-126)/(66-94) 126/78 (07/18 0403) SpO2:  [97 %-100 %] 100 % (07/18 0403) Last BM Date: 04/30/18  Intake/Output from previous day: No intake/output data recorded. Intake/Output this shift: Total I/O In: 120 [P.O.:120] Out: -   PE: Gen:  Alert, NAD, pleasant Card:  Regular rate and rhythm, pedal pulses 2+ BL Pulm:  Normal effort, clear to auscultation bilaterally Abd: Soft, non-tender, not significantly distended Skin: warm and dry, no rashes  Psych: A&Ox3   Lab Results:  Recent Labs    05/01/18 0615 05/02/18 0649  WBC 5.7 8.6  HGB 11.6* 11.7*  HCT 34.3* 34.9*  PLT 112* 146*   BMET Recent Labs    05/01/18 0615 05/02/18 0649  NA 139 137  K 3.0* 2.9*  CL 101 102  CO2 27 26  GLUCOSE 118* 142*  BUN 9 8  CREATININE 0.81 0.83  CALCIUM 8.7* 8.8*   PT/INR No results for input(s): LABPROT, INR in the last 72 hours. CMP     Component Value Date/Time   NA 137 05/02/2018 0649   K 2.9 (L) 05/02/2018 0649   CL 102 05/02/2018 0649   CO2 26 05/02/2018 0649   GLUCOSE 142 (H) 05/02/2018 0649   BUN 8 05/02/2018 0649   CREATININE 0.83 05/02/2018 0649   CREATININE 1.10 04/26/2018 1125   CALCIUM 8.8 (L) 05/02/2018 0649   PROT 7.0 05/02/2018 0649   ALBUMIN 3.3 (L) 05/02/2018 0649   AST 28 05/02/2018 0649   ALT 35 05/02/2018 0649   ALKPHOS 130 (H) 05/02/2018 0649   BILITOT 1.7 (H) 05/02/2018 0649   GFRNONAA >60 05/02/2018 0649   GFRNONAA 93 04/26/2018 1125   GFRAA >60 05/02/2018 0649   GFRAA 108 04/26/2018 1125   Lipase     Component Value Date/Time   LIPASE 20 04/26/2018 1843        Studies/Results: No results found.  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 Continue anticoagulation - has been transitioned to Eliquis per medicine -Ok for regular diet from our standpoint -Hx of ?PSC - trend LFTs, Tbili 1.7. today; appears to have indirect hyperbilirubinemia. AST/ALT/Alk phos are normal; GI following; will defer to them -Hematology for hypercoagulability workup - may benefit from this being done as an outpatient and through the acute event but will defer to them   LOS: 4 days   Ileana Roup , [facility] 05/02/2018, 8:54 AM

## 2018-05-02 NOTE — Progress Notes (Signed)
     [facility] Progress Note  CC:  Abdominal pain and acute SMV thrombosis  Subjective:  Says that he feels good.  Has breakfast that just came up for him so planning to eat.  Had a BM this AM, non-bloody.  Objective:  Vital signs in last 24 hours: Temp:  [98.1 F (36.7 C)-98.4 F (36.9 C)] 98.4 F (36.9 C) (07/18 0403) Pulse Rate:  [61-79] 61 (07/18 0403) Resp:  [16-18] 17 (07/18 0403) BP: (106-126)/(66-94) 126/78 (07/18 0403) SpO2:  [97 %-100 %] 100 % (07/18 0403) Last BM Date: 04/30/18 General:  Alert, Well-developed, in NAD Heart:  Regular rate and rhythm; no murmurs Pulm:  No W/R/R. Abdomen:  Soft, non-distended.  BS present.  Mild diffuse TTP in lower abdomen. Extremities:  Without edema. Neurologic:  Alert and oriented x 4;  grossly normal neurologically. Psych:  Alert and cooperative. Normal mood and affect.  Intake/Output this shift: Total I/O In: 120 [P.O.:120] Out: -   Lab Results: Recent Labs    04/30/18 0624 05/01/18 0615 05/02/18 0649  WBC 4.4 5.7 8.6  HGB 12.9* 11.6* 11.7*  HCT 38.7* 34.3* 34.9*  PLT 115* 112* 146*   BMET Recent Labs    04/30/18 0624 05/01/18 0615 05/02/18 0649  NA 139 139 137  K 3.1* 3.0* 2.9*  CL 100 101 102  CO2 25 27 26   GLUCOSE 109* 118* 142*  BUN 15 9 8   CREATININE 1.01 0.81 0.83  CALCIUM 9.3 8.7* 8.8*   LFT Recent Labs    05/01/18 0615 05/02/18 0649  PROT 6.4* 7.0  ALBUMIN 3.0* 3.3*  AST 17 28  ALT 20 35  ALKPHOS 67 130*  BILITOT 1.3* 1.7*  BILIDIR 0.5*  --   IBILI 0.8  --    Assessment / Plan: 1. Abdominal pain:Now thought mostly due to SMV thrombosis.  Was on heparin but switched to Eliquis. 2. Possible small duct HRC:BULAGTXM ultrasound. 3. Gallbladder sludge 4. Ileus:Now improved/resolved.  -Ok for discharge today if he tolerates diet.  Has follow-up in our office on 7/30. -Needs semi-urgent outpatient hematology evaluation.   LOS: 4 days   Kyle Gonzales. Kyle Gonzales  05/02/2018,  9:23 AM

## 2018-05-03 ENCOUNTER — Telehealth: Payer: Self-pay | Admitting: Hematology

## 2018-05-03 ENCOUNTER — Encounter: Payer: Self-pay | Admitting: Hematology

## 2018-05-03 LAB — FACTOR 5 LEIDEN

## 2018-05-03 NOTE — Telephone Encounter (Signed)
New hematology appointment has been scheduled for the pt to see Dr. Irene Limbo on 8/6 at 1pm, labs at 1230pm. Appointment date and time has been given to the pt's mom, [name]. Letter mailed.

## 2018-05-06 LAB — PROTHROMBIN GENE MUTATION

## 2018-05-07 ENCOUNTER — Telehealth: Payer: Self-pay | Admitting: *Deleted

## 2018-05-07 NOTE — Telephone Encounter (Signed)
Called patient on 05/07/2018 , 10:52 AM in an attempt to reach the patient for a hospital follow up.   Admit date: 04/26/18 Discharge: 05/02/18   He DOES NOT  have any questions or concerns about medications from the hospital admission. The patient's medications were reviewed over the phone, they were counseled to bring in all current medications to the hospital follow up visit.   I advised the patient to call if any questions or concerns arise about the hospital admission or medications    Home health WAS NOT  started in the hospital.  All questions were answered and a follow up appointment was made.   Prior to Admission medications   Medication Sig Start Date End Date Taking? Authorizing Provider  apixaban (ELIQUIS) 5 MG TABS tablet Take 10 mg (2 tablets) oral twice daily for next 5 days, then transition to 5 mg (1 tablet) oral twice daily from 05/08/2018 05/02/18   Elgergawy, Silver Huguenin, MD  bisacodyl (DULCOLAX) 10 MG suppository Place 1 suppository (10 mg total) rectally every 8 (eight) hours as needed for moderate constipation. 05/02/18   Elgergawy, Silver Huguenin, MD  bisoprolol-hydrochlorothiazide (ZIAC) 10-6.25 MG tablet Take 1 tablet by mouth daily. 01/22/18   Liane Comber, NP  Cholecalciferol (VITAMIN D3) 5000 units CAPS Take 1 capsule (5,000 Units total) by mouth every other day. 05/02/18   Elgergawy, Silver Huguenin, MD  ezetimibe (ZETIA) 10 MG tablet Take 1 tablet daily for Cholesterol 02/18/18   [name], MD  famotidine (PEPCID) 20 MG tablet Take 1 tablet (20 mg total) by mouth 2 (two) times daily. 04/10/18   Charlesetta Shanks, MD  hydrOXYzine (ATARAX/VISTARIL) 25 MG tablet Take 1 tablet (25 mg total) by mouth 3 (three) times daily as needed for anxiety. 03/01/18   Nanci Pina, FNP  levothyroxine (SYNTHROID, LEVOTHROID) 75 MCG tablet take 1 tablet by mouth every morning ON AN EMPTY STOMACH 02/08/18   [name], MD  omeprazole (PRILOSEC) 40 MG capsule Take 1 capsule (40 mg total) by mouth  daily. Please schedule an Office visit for future refills: [phone] 04/01/18   Yetta Flock, MD  ondansetron [facility]) 8 MG tablet Take 1 tablet 3 x/ day before meals - Nausea 04/26/18   [name], MD  oxyCODONE (OXY IR/ROXICODONE) 5 MG immediate release tablet Take 0.5 tablets (2.5 mg total) by mouth every 4 (four) hours as needed for severe pain. 05/02/18   Elgergawy, Silver Huguenin, MD  potassium chloride (K-DUR,KLOR-CON) 20 MEQ tablet Take 1 tablet (20 mEq total) by mouth daily. 05/02/18   Elgergawy, Silver Huguenin, MD  sertraline (ZOLOFT) 100 MG tablet Take 1 tablet (100 mg total) by mouth at bedtime. 04/01/18   Norman Clay, MD  sucralfate (CARAFATE) 1 g tablet Take 1 tablet (1 g total) by mouth 2 (two) times daily. 04/25/18   Antonietta Breach, PA-C  traZODone (DESYREL) 50 MG tablet Take 1 tablet (50 mg total) by mouth at bedtime as needed for sleep. 03/01/18   Nanci Pina, FNP

## 2018-05-09 NOTE — Progress Notes (Deleted)
Hospital follow up  Assessment and Plan: Hospital visit follow up for: SMV thrombosis Hospital discharge meds were reviewed, and reconciled with the patient.   Diagnoses and all orders for this visit:  Superior mesenteric vein thrombosis  Primary sclerosing cholangitis  Essential hypertension  Hypokalemia    There are no discontinued medications. CAN NOT DO FOR BCBS REGULAR OR MEDICARE  Over 40 minutes of exam, counseling, chart review, and complex, high/moderate level critical decision making was performed this visit.   Future Appointments  Date Time Provider [place]  05/10/2018  8:45 AM Liane Comber, NP [facility] None  05/14/2018  9:00 AM Esterwood, Amy S, PA-C [facility]  05/21/2018 12:30 PM [facility] 6 [facility] None  05/21/2018  1:00 PM Brunetta Genera, MD [facility] None  05/29/2018  3:00 PM Alonza Smoker, LCSW [facility] None  06/05/2018  2:00 PM Liane Comber, NP [facility] None  09/04/2018  2:30 PM [name], MD [facility] None  03/05/2019  3:45 PM [name], MD [facility] None      HPI 27 y.o.male presents for follow up for transition from recent hospitalization or SNIF stay. Admit date to the hospital was 04/26/18, patient was discharged from the hospital on 05/02/18 and our clinical staff contacted the office the day after discharge to set up a follow up appointment. The discharge summary, medications, and diagnostic test results were reviewed before meeting with the patient. The patient was admitted for:   Joao Sadler27 year old male with history of Graves' disease, PSC, anxiety and significant depression presented to the emergency department complaining of epigastric pain for 3 days prior to admission. Has been evaluated by GI for primary sclerosing cholangitis, had normal LFTs, ultrasound, CT scan and MRI of the abdomen showed stable biliary duct dilation/cavernous transformation of portal vein. ESR elevated and  gallbladder sludge without acute cholecystitis. CT also showed questionable inflamed appendix. Surgery was consulted and felt that this is not acute appendicitis. Patient was admitted with working diagnosis of intractable abdominal pain with nausea and vomiting. GI was consulted suspect mesenteric adenitis versus PSC started on trial of prednisone. Repeated CT scan shows acute SMV thrombosis patient started on heparin drip, he was transitioned to Eliquis, abdominal pain significantly subsided, no further nausea or vomiting, tolerating soft diet. Hypercoag workup was initiated with multiple abormalities including protein S, cardiolipin antibodies and homocystine and he has been scheduled to see hem/onc Dr. Irene Limbo on 05/21/2018. He is scheduled to follow up with outpatient GI on 05/14/2018.     Home health is not involved.   Images while in the hospital: Mr Abdomen W Or Wo Contrast  Result Date: 04/26/2018 CLINICAL DATA:  Followup cholecystitis and cholangitis. History of primary sclerosing cholangitis. EXAM: MRI ABDOMEN WITHOUT AND WITH CONTRAST TECHNIQUE: Multiplanar multisequence MR imaging of the abdomen was performed both before and after the administration of intravenous contrast. CONTRAST:  61m MULTIHANCE GADOBENATE DIMEGLUMINE 529 MG/ML IV SOLN COMPARISON:  Ultrasound 04/24/2018.  MRI 11/20/2017 and 02/13/2017. FINDINGS: Lower chest:  The visualized lower chest appears unremarkable. Hepatobiliary: Typical hemangiomas are again noted within the right hepatic lobe, measuring 2.0 cm on image 15/5 and 13 mm on image 25/5. These demonstrate typical enhancement. There are no suspicious hepatic findings. There is stable mild intrahepatic biliary dilatation and irregularity consistent with sclerosing cholangitis. There is dependent sludge within the gallbladder. No evidence of choledocholithiasis or extrahepatic biliary dilatation. 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 Pancreas:  Unremarkable. No pancreatic ductal dilatation or surrounding inflammatory changes.  Spleen: Normal in size without focal abnormality. Adrenals/Urinary Tract: Both adrenal glands appear normal. The kidneys appear normal without evidence of urinary tract calculus or hydronephrosis. Bladder not imaged. Stomach/Bowel: The stomach appears decompressed. There are multiple mildly dilated loops of small bowel in the mid abdomen. The visualized colon appears normal in caliber. Vascular/Lymphatic: There are no enlarged abdominal lymph nodes. No acute vascular findings are seen. 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 Other: There is a small amount of ascites which appears new. No focal extraluminal fluid collection seen. Musculoskeletal: No acute or significant osseous findings. IMPRESSION: 1. Mild small bowel dilatation and ascites. Findings could be secondary to ileus and peritonitis. Radiographic follow up recommended to exclude early obstruction. 2. Stable appearance of the biliary system consistent with sclerosing cholangitis. No significant biliary dilatation or evidence of choledocholithiasis. Mild gallbladder sludge. 3. 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 4. Stable hepatic hemangiomas. Electronically Signed   By: Richardean Sale M.D.   On: 04/26/2018 23:15   Ct Abdomen Pelvis W Contrast  Result Date: 04/27/2018 CLINICAL DATA:  Abdominal pain with chills but no fever for 3 days. Bowel dilatation and ascites on MRCP. History of sclerosing cholangitis. EXAM: CT ABDOMEN AND PELVIS WITH CONTRAST TECHNIQUE: Multidetector CT imaging of the abdomen and pelvis was performed using the standard protocol following bolus administration of intravenous contrast. CONTRAST:  135m ISOVUE-300 IOPAMIDOL (ISOVUE-300) INJECTION 61% COMPARISON:  MRI 04/26/2018 and 11/20/2017. FINDINGS: Lower chest: Mild linear atelectasis at both lung bases. No significant pleural or pericardial effusion.  Hepatobiliary: There are known hemangiomas within the right hepatic lobe which are stable. There is stable mild intrahepatic biliary dilatation and irregularity consistent with sclerosing cholangitis. Dependent high-density sludge is present in the gallbladder lumen. There is no extrahepatic biliary dilatation. Pancreas: Unremarkable. No pancreatic ductal dilatation or surrounding inflammatory changes. Spleen: Normal in size without focal abnormality. Adrenals/Urinary Tract: Both adrenal glands appear normal. Both kidneys appear normal. No evidence of urinary tract calculus or hydronephrosis. There is contrast material within the bladder. No evidence of bladder wall thickening. Stomach/Bowel: The initial portal phase images are motion degraded (due to patient vomiting) and no enteric contrast was administered. Delayed imaging was performed through the pelvis. No significant small or large bowel wall thickening, distention or surrounding inflammation. Because of these limitations, confident identification of the appendix is limited, although there is an apparent blind ending tubular structure extending into the midline pelvis which measures up to 11 mm in diameter on image 65/4. This is also seen on coronal images 59 and 60 of series 9. Vascular/Lymphatic: There are no enlarged abdominal or pelvic lymph nodes. No acute vascular findings. 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 Reproductive: The prostate gland and seminal vesicles appear normal. Other: Small amount of ascites, predominately in the pelvis and subhepatic space. No focal extraluminal fluid collection. There is mild mesenteric edema. Musculoskeletal: No acute or significant osseous findings. IMPRESSION: 1. Study is limited by motion and the lack of enteric contrast. There is possible inflammation of the appendix in the low pelvis, and acute appendicitis cannot be excluded. Surgical evaluation recommended. 2. There is mild ascites and  nonspecific mesenteric edema. No focal extraluminal fluid collection or bowel obstruction identified. 3. Stable appearance of the liver with mild intrahepatic biliary dilatation and irregularity attributed to sclerosing cholangitis. 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 4. These results were called by telephone at the time of interpretation on 04/27/2018 at 3:02 am to the patient's nurse  Corliss Blacker, who verbally acknowledged these results. Electronically Signed   By: Richardean Sale M.D.   On: 04/27/2018 03:02    Past Medical History:  Diagnosis Date  . GERD (gastroesophageal reflux disease)   . Graves disease 08/18/2015  . Hyperlipidemia   . Hypothyroid   . Palpitations   . Vitamin D deficiency      Allergies  Allergen Reactions  . Tylenol [Acetaminophen]     r/t elevated LFTs      Current Outpatient Medications on File Prior to Visit  Medication Sig Dispense Refill  . apixaban (ELIQUIS) 5 MG TABS tablet Take 10 mg (2 tablets) oral twice daily for next 5 days, then transition to 5 mg (1 tablet) oral twice daily from 05/08/2018 70 tablet 0  . bisacodyl (DULCOLAX) 10 MG suppository Place 1 suppository (10 mg total) rectally every 8 (eight) hours as needed for moderate constipation. 12 suppository 0  . bisoprolol-hydrochlorothiazide (ZIAC) 10-6.25 MG tablet Take 1 tablet by mouth daily. 90 tablet 0  . Cholecalciferol (VITAMIN D3) 5000 units CAPS Take 1 capsule (5,000 Units total) by mouth every other day.    . ezetimibe (ZETIA) 10 MG tablet Take 1 tablet daily for Cholesterol 90 tablet 0  . famotidine (PEPCID) 20 MG tablet Take 1 tablet (20 mg total) by mouth 2 (two) times daily. 30 tablet 0  . hydrOXYzine (ATARAX/VISTARIL) 25 MG tablet Take 1 tablet (25 mg total) by mouth 3 (three) times daily as needed for anxiety. 30 tablet 0  . levothyroxine (SYNTHROID, LEVOTHROID) 75 MCG tablet take 1 tablet by mouth every morning ON AN EMPTY STOMACH 90 tablet 1  . omeprazole  (PRILOSEC) 40 MG capsule Take 1 capsule (40 mg total) by mouth daily. Please schedule an Office visit for future refills: [phone] 30 capsule 2  . ondansetron (ZOFRAN) 8 MG tablet Take 1 tablet 3 x/ day before meals - Nausea 60 tablet 0  . oxyCODONE (OXY IR/ROXICODONE) 5 MG immediate release tablet Take 0.5 tablets (2.5 mg total) by mouth every 4 (four) hours as needed for severe pain. 15 tablet 0  . potassium chloride (K-DUR,KLOR-CON) 20 MEQ tablet Take 1 tablet (20 mEq total) by mouth daily. 30 tablet 0  . sertraline (ZOLOFT) 100 MG tablet Take 1 tablet (100 mg total) by mouth at bedtime. 30 tablet 0  . sucralfate (CARAFATE) 1 g tablet Take 1 tablet (1 g total) by mouth 2 (two) times daily. 60 tablet 1  . traZODone (DESYREL) 50 MG tablet Take 1 tablet (50 mg total) by mouth at bedtime as needed for sleep. 30 tablet 0   No current facility-administered medications on file prior to visit.     ROS: all negative except above.   Physical Exam: There were no vitals filed for this visit. There were no vitals taken for this visit. General Appearance: Well nourished, in no apparent distress. Eyes: PERRLA, EOMs, conjunctiva no swelling or erythema Sinuses: No Frontal/maxillary tenderness ENT/Mouth: Ext aud canals clear, TMs without erythema, bulging. No erythema, swelling, or exudate on post pharynx.  Tonsils not swollen or erythematous. Hearing normal.  Neck: Supple, thyroid normal.  Respiratory: Respiratory effort normal, BS equal bilaterally without rales, rhonchi, wheezing or stridor.  Cardio: RRR with no MRGs. Brisk peripheral pulses without edema.  Abdomen: Soft, + BS.  Non tender, no guarding, rebound, hernias, masses. Lymphatics: Non tender without lymphadenopathy.  Musculoskeletal: Full ROM, 5/5 strength, normal gait.  Skin: Warm, dry without rashes, lesions, ecchymosis.  Neuro: Cranial nerves intact.  Normal muscle tone, no cerebellar symptoms. Sensation intact.  Psych: Awake and  oriented X 3, normal affect, Insight and Judgment appropriate.     Izora Ribas, NP 1:01 PM [facility]

## 2018-05-10 ENCOUNTER — Ambulatory Visit: Payer: Self-pay | Admitting: Internal Medicine

## 2018-05-10 ENCOUNTER — Ambulatory Visit: Payer: Self-pay | Admitting: Adult Health

## 2018-05-14 ENCOUNTER — Ambulatory Visit (INDEPENDENT_AMBULATORY_CARE_PROVIDER_SITE_OTHER): Payer: 59 | Admitting: Physician Assistant

## 2018-05-14 ENCOUNTER — Other Ambulatory Visit (INDEPENDENT_AMBULATORY_CARE_PROVIDER_SITE_OTHER): Payer: 59

## 2018-05-14 ENCOUNTER — Encounter: Payer: Self-pay | Admitting: Physician Assistant

## 2018-05-14 VITALS — BP 118/70 | HR 95 | Ht 75.0 in | Wt 219.1 lb

## 2018-05-14 DIAGNOSIS — K55069 Acute infarction of intestine, part and extent unspecified: Secondary | ICD-10-CM

## 2018-05-14 DIAGNOSIS — K8301 Primary sclerosing cholangitis: Secondary | ICD-10-CM

## 2018-05-14 DIAGNOSIS — R1084 Generalized abdominal pain: Secondary | ICD-10-CM

## 2018-05-14 DIAGNOSIS — I81 Portal vein thrombosis: Secondary | ICD-10-CM

## 2018-05-14 DIAGNOSIS — IMO0002 Reserved for concepts with insufficient information to code with codable children: Secondary | ICD-10-CM

## 2018-05-14 LAB — COMPREHENSIVE METABOLIC PANEL
ALBUMIN: 3.9 g/dL (ref 3.5–5.2)
ALK PHOS: 156 U/L — AB (ref 39–117)
ALT: 41 U/L (ref 0–53)
AST: 19 U/L (ref 0–37)
BILIRUBIN TOTAL: 1.1 mg/dL (ref 0.2–1.2)
BUN: 8 mg/dL (ref 6–23)
CALCIUM: 9.7 mg/dL (ref 8.4–10.5)
CO2: 27 mEq/L (ref 19–32)
CREATININE: 0.93 mg/dL (ref 0.40–1.50)
Chloride: 96 mEq/L (ref 96–112)
GFR: 126.67 mL/min (ref >60.00)
Glucose, Bld: 127 mg/dL — ABNORMAL HIGH (ref 70–99)
Potassium: 4 mEq/L (ref 3.5–5.1)
Sodium: 133 mEq/L — ABNORMAL LOW (ref 135–145)
TOTAL PROTEIN: 8.1 g/dL (ref 6.0–8.3)

## 2018-05-14 LAB — CBC WITH DIFFERENTIAL/PLATELET
Basophils Absolute: 0 10*3/uL (ref 0.0–0.1)
Basophils Relative: 0.1 % (ref 0.0–3.0)
Eosinophils Absolute: 0 10*3/uL (ref 0.0–0.7)
Eosinophils Relative: 0.1 % (ref 0.0–5.0)
HEMATOCRIT: 36.2 % — AB (ref 39.0–52.0)
HEMOGLOBIN: 12.1 g/dL — AB (ref 13.0–17.0)
LYMPHS PCT: 9.2 % — AB (ref 12.0–46.0)
Lymphs Abs: 1.2 10*3/uL (ref 0.7–4.0)
MCHC: 33.4 g/dL (ref 30.0–36.0)
MCV: 87 fl (ref 78.0–100.0)
MONO ABS: 1.3 10*3/uL — AB (ref 0.1–1.0)
Monocytes Relative: 10 % (ref 3.0–12.0)
NEUTROS ABS: 10.2 10*3/uL — AB (ref 1.4–7.7)
Neutrophils Relative %: 80.6 % — ABNORMAL HIGH (ref 43.0–77.0)
PLATELETS: 248 10*3/uL (ref 150.0–400.0)
RBC: 4.16 Mil/uL — ABNORMAL LOW (ref 4.22–5.81)
RDW: 14.8 % (ref 11.5–15.5)
WBC: 12.6 10*3/uL — ABNORMAL HIGH (ref 4.0–10.5)

## 2018-05-14 NOTE — Progress Notes (Signed)
Agree with assessment and plan as outlined.  

## 2018-05-14 NOTE — Patient Instructions (Signed)
Your provider has requested that you go to the basement level for lab work before leaving today. Press "B" on the elevator. The lab is located at the first door on the left as you exit the elevator. Take the zofran 4 mg tablet one hour before the CT scan.    You have been scheduled for a CT scan of the abdomen and pelvis at [place] ([address]---this is in the same building as Press photographer).   You are scheduled on Friday 05-17-2018 at 3:00 PM. You should arrive at 2:45 PM to your appointment time for registration. Please follow the written instructions below on the day of your exam:  WARNING: IF YOU ARE ALLERGIC TO IODINE/X-RAY DYE, PLEASE NOTIFY RADIOLOGY IMMEDIATELY AT [phone]! YOU WILL BE GIVEN A 13 HOUR PREMEDICATION PREP.   1) Do not eat  anything after 11:00 am (4 hours prior to your test) 2) You have been given 2 bottles of oral contrast to drink. The solution may taste better if refrigerated, but do NOT add ice or any other liquid to this solution. Shake well before drinking.    Drink 1 bottle of contrast @ 1:00 Pm(2 hours prior to your exam)  Drink 1 bottle of contrast @ 2:00 PM (1 hour prior to your exam)  You may take any medications as prescribed with a small amount of water except for the following: Metformin, Glucophage, Glucovance, Avandamet, Riomet, Fortamet, Actoplus Met, Janumet, Glumetza or Metaglip. The above medications must be held the day of the exam AND 48 hours after the exam.  The purpose of you drinking the oral contrast is to aid in the visualization of your intestinal tract. The contrast solution may cause some diarrhea. Before your exam is started, you will be given a small amount of fluid to drink. Depending on your individual set of symptoms, you may also receive an intravenous injection of x-ray contrast/dye. Plan on being at [facility] for 30 minutes or long, depending on the type of exam you are having performed.  If you  have any questions regarding your exam or if you need to reschedule, you may call the CT department at [phone] between the hours of 8:00 am and 5:00 pm, Monday-Friday.  Normal BMI (Body Mass Index- based on height and weight) is between 19 and 25. Your BMI today is Body mass index is 27.39 kg/m. Marland Kitchen Please consider follow up  regarding your BMI with your Primary Care Provider.

## 2018-05-14 NOTE — Progress Notes (Signed)
Subjective:    Patient ID: Kyle Gonzales, male    DOB: Feb 20, 1991, 27 y.o.   MRN: [id number]  HPI  Kyle Gonzales is a 27 year old African-American male, known to Dr. Havery Moros who was last seen in our office a little over a year ago.  He has diagnosis of small duct PSC, with normal liver biopsy and abnormal imaging.  He is currently being followed by Dr. Zollie Scale /hepatology [facility].  [name] He also has history of hypertension, Graves' disease and major depression. Patient was recently hospitalized 7/12 through 05/02/2018 and comes in today for post hospital follow-up.  He was admitted with complaints of fairly diffuse abdominal pain, was noted to have a sed rate of 70. Initially MRI was done which showed mild small bowel dilation and ascites stable appearance of the biliary system consistent with sclerosing cholangitis no significant biliary dilation or evidence of choledocholithiasis.  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  There is no pneumoperitoneum he did have a small amount of pelvic ascites, and was noted to have persistent mild dilation of small bowel loops throughout the abdomen with air-fluid levels no definite pneumatosis, small bowel ischemia could not be ruled out no free air. She was evaluated by GI during this admission, he was started on heparin infusion and then transition to Eliquis.  He is scheduled to see hematology/Dr. Irene Limbo next week for hypercoagulable work-up.  He and his mother says  that he continues to have q. six-month follow-up with hepatology.  [name] Since discharge from the hospital he says that his abdominal pain overall had decreased over admission but he is still having ongoing pain.  He says some days are worse than others has had some increase in discomfort over the past 2 days.  He started taking oxycodone again which she had been off of.  He has been able to eat, has increased discomfort with heavier or fatty foods, no vomiting.  No fever or chills and says that his bowels are moving. He asked me today whether smoking would adversely affect him, he has been smoking tobacco though not on a daily basis and more specifically was asking about marijuana.  Review of Systems Pertinent positive and negative review of systems were noted in the above HPI section.  All other review of systems was otherwise negative.  Outpatient Encounter Medications as of 05/14/2018  Medication Sig  . apixaban (ELIQUIS) 5 MG TABS tablet Take 10 mg (2 tablets) oral twice daily for next 5 days, then transition to 5 mg (1 tablet) oral twice daily from 05/08/2018  . bisacodyl (DULCOLAX) 10 MG suppository Place 1 suppository (10 mg total) rectally every 8 (eight) hours as needed for moderate constipation.  . bisoprolol-hydrochlorothiazide (ZIAC) 10-6.25 MG tablet Take 1 tablet by mouth daily.  . Cholecalciferol (VITAMIN D3) 5000 units CAPS Take 1 capsule (5,000 Units total) by mouth every other day.  . ezetimibe (ZETIA) 10 MG tablet Take 1 tablet daily for Cholesterol  . famotidine (PEPCID) 20 MG tablet Take 1 tablet (20 mg total) by mouth 2 (two)  times daily.  . hydrOXYzine (ATARAX/VISTARIL) 25 MG tablet Take 1 tablet (25 mg total) by mouth 3 (three) times daily as needed for anxiety.  Marland Kitchen levothyroxine (SYNTHROID, LEVOTHROID) 75 MCG tablet take 1 tablet by mouth every morning ON AN EMPTY STOMACH  . omeprazole (PRILOSEC) 40 MG capsule Take 1 capsule (40 mg total) by mouth daily. Please schedule an  Office visit for future refills: [phone]  . ondansetron (ZOFRAN) 8 MG tablet Take 1 tablet 3 x/ day before meals - Nausea  . oxyCODONE (OXY IR/ROXICODONE) 5 MG immediate release tablet Take 0.5 tablets (2.5 mg total) by mouth every 4 (four) hours as needed for severe pain.  . potassium chloride (K-DUR,KLOR-CON) 20 MEQ tablet Take 1 tablet (20 mEq total) by mouth daily.  . sertraline (ZOLOFT) 100 MG tablet Take 1 tablet (100 mg total) by mouth at bedtime.  . sucralfate (CARAFATE) 1 g tablet Take 1 tablet (1 g total) by mouth 2 (two) times daily.  . traZODone (DESYREL) 50 MG tablet Take 1 tablet (50 mg total) by mouth at bedtime as needed for sleep.   No facility-administered encounter medications on file as of 05/14/2018.    Allergies  Allergen Reactions  . Tylenol [Acetaminophen]     r/t elevated LFTs    Patient Active Problem List   Diagnosis Date Noted  . Superior mesenteric vein thrombosis   . Abdominal pain 04/27/2018  . 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 Hypertension 04/15/2018  . MDD (major depressive disorder), recurrent severe, without psychosis ([place]) 02/26/2018  . Non-compliance 01/14/2018  . Current severe episode of major depressive disorder without psychotic features ([place]) 11/06/2017  . Transaminitis 04/08/2017  . Abnormal LFTs 04/08/2017  . Drug addiction / Marajuana ([place]) 01/06/2017  . Hyperlipidemia, mixed 01/11/2016  . Palpitations 12/04/2015  . Hypothyroidism 12/04/2015  . Graves disease 08/18/2015  . Vitamin D deficiency 08/11/2015   Social History   Socioeconomic History  . Marital status: Single    Spouse name: Not on file  . Number of children: Not on file  . Years of education: Not on file  . Highest education level: Not on file  Occupational History  . Not on file  Social Needs  . Financial resource strain: Not on file  . Food insecurity:    Worry: Not on file    Inability: Not on file  . Transportation needs:    Medical: Not on  file    Non-medical: Not on file  Tobacco Use  . Smoking status: Current Some Day Smoker  . Smokeless tobacco: Never Used  . Tobacco comment: Marijuana  Substance and Sexual Activity  . Alcohol use: No  . Drug use: Yes    Types: Marijuana  . Sexual activity: Yes  Lifestyle  . Physical activity:    Days per week: Not on file    Minutes per session: Not on file  . Stress: Not on file  Relationships  . Social connections:    Talks on phone: Not on file    Gets together: Not on file    Attends religious service: Not on file    Active member of club or organization: Not on file    Attends meetings of clubs or organizations: Not on file    Relationship status: Not on file  . Intimate partner violence:    Fear of current or ex partner: Not on file    Emotionally abused: Not on file    Physically abused: Not on file  Forced sexual activity: Not on file  Other Topics Concern  . Not on file  Social History Narrative  . Not on file    Mr. Lambert's family history includes Colon cancer in his maternal grandmother; Diabetes in his maternal grandfather, maternal grandmother, and mother; Stomach cancer in his maternal grandfather and maternal grandmother.      Objective:    Vitals:   05/14/18 0900  BP: 118/70  Pulse: 95    Physical Exam; well-developed young African-American male in no acute distress, he was standing, pacing somewhat uncomfortable looking until he was asked to get on the exam table.  Accompanied by his mother, both pleasant blood pressure 118/70 pulse 95 BMI 27.3.  HEENT; nontraumatic normocephalic EOMI PERRLA sclera anicteric, Oropharynx benign, Cardiovascular ;regular rate and rhythm with S1-S2 no murmur rub or gallop, Pulmonary; clear bilaterally, Abdomen;, soft, bowel sounds are present slightly protuberant he has rather generalized abdominal tenderness and rebound no palpable mass or hepatosplenomegaly, Rectal; exam not done, Extremities; no clubbing cyanosis or  edema skin warm and dry, Neuro psych; alert and oriented, grossly nonfocal, affect flat       Assessment & Plan:   #60 27 year old African-American male with small duct PSC diagnosed 2018 with normal liver biopsy and abnormal imaging.  He is currently followed by hepatology/Dr. Zollie Scale  #2 recent hospitalization with acute abdominal pain and finding of an acute SMV thrombus, and associated ileus/dilated bowel could not absolutely rule out component of ischemia, he also had a small amount of pelvic ascites. Patient is now on Eliquis, and awaiting hematology evaluation for hypercoagulable work-up He is improved over admission but continuing to complain of pain 2 weeks after diagnosis has  actually increased mildly over the past couple of days-rule out component of persistent ileus, rule out small bowel ischemia, rule out propagation of thrombus  #3 hypertension #4.  Graves' disease #5.  History of major depression  Plan; CBC with differential, C met. We will schedule for follow-up CT of the abdomen and pelvis tomorrow. Patient and mom advised to keep the appointment with hematology next week. Further plans pending results of above.    Amy S Esterwood PA-C 05/14/2018   Cc: [name], MD

## 2018-05-17 ENCOUNTER — Inpatient Hospital Stay: Admission: RE | Admit: 2018-05-17 | Payer: Self-pay | Source: Ambulatory Visit

## 2018-05-20 NOTE — Progress Notes (Signed)
HEMATOLOGY/ONCOLOGY CONSULTATION NOTE  Date of Service: 05/21/2018  Patient Care Team: [name], MD as PCP - General (Internal Medicine)  CHIEF COMPLAINTS/PURPOSE OF CONSULTATION:  SMV thrombosis   HISTORY OF PRESENTING ILLNESS:   Kyle Gonzales is a wonderful 27 y.o. male who has been referred to [place] by Dr. 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 He is accompanied today by his mother. The pt reports that he is doing well overall.   The pt presented to the ED on 04/26/18 for significant abdominal pain which increased for several weeks prior. The pain worsened with movement and the pt noted some association with eating. A 04/27/18 CT A/P indicated a SMV thrombosis and the pt was given heparin and discharged with oral Eliquis. He will be having a repeat CT A/P on 05/24/18.  The pt reports that he has never had clots prior to these events. Presently he notes that his abdominal pain has alleviated substantially, but remains slightly. He denies any previous abdominal trauma or abdominal infections. He denies any current bowel abnormalities. He is tolerating regular meals well and is maintaining compliance with Eliquis.   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 He began going to see GI Dr. Havery Moros within the last two years. He is currently not taking any medications. He denies any concerns for Crohn's disease, but prior to his blood clot he notes more frequent bowel movements and denies diarrhea, blood in the stools, and other abnormalities.  He was also previously diagnosed with Grave's disease.   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 2. 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 3. Persistent mild dilatation of small bowel loops  throughout the abdomen with air-fluid levels. Increased mild small bowel wall thickening. No definite pneumatosis. Small bowel ischemia cannot be excluded. No free air. 4. Nonspecific dilatation and mild wall thickening in the appendix, mildly increased. 5. Small volume pelvic ascites. 6. Chronic mild intrahepatic biliary ductal dilatation is unchanged back to 2018 MRI studies. No acute biliary abnormality.   Most recent lab results (05/14/18) of CBC w/diff is as follows: all values are WNL except for WBC at 12.6k, RBC at 4.16, HGB at 12.1, HCT at 36.2, ANC at 10.2k, Monocytes abs at 1.3k. 04/29/18 Antithrombin III was WNL at 88%. 04/29/18 Lupus anticoagulant panel revealed DRVVT at 60.4sec. 04/29/18 Beta-2 Glycoproteins were all normal.  04/29/18 Cardiolipin antibodies showed Anticardiolipin IgM at 18. 04/29/18 dRVVT Mix was slightly elevated at 47.4 04/29/18 dRVVT Confirm was slightly elevated at 1.3 04/29/18 Homocysteine was elevated at 22.4  On review of systems, pt reports slight remaining abdominal pain, and denies diarrhea, blood in the stools, bowel abnormalities, unexpected weight loss, skin rashes, pain along the spine, red/swollen/painful joints, leg pain/swelling, testicular pain/swelling, and any other symptoms.   On Social Hx the pt reports smoking marijuana recreationally and denies ETOH consumption, any other drug use.  On Family Hx the pt reports maternal grandmother with stomach cancer, and denies blood clots.  MEDICAL HISTORY:  Past Medical History:  Diagnosis Date  . GERD (gastroesophageal reflux disease)   . Graves disease 08/18/2015  . Hyperlipidemia   . Hypothyroid   . Palpitations   . Vitamin D deficiency     SURGICAL HISTORY: Past Surgical History:  Procedure Laterality Date  . ANTERIOR  CRUCIATE LIGAMENT REPAIR  2012  . WISDOM TOOTH EXTRACTION      SOCIAL HISTORY: Social History   Socioeconomic History  . Marital status: Single    Spouse name: Not on file  .  Number of children: Not on file  . Years of education: Not on file  . Highest education level: Not on file  Occupational History  . Not on file  Social Needs  . Financial resource strain: Not on file  . Food insecurity:    Worry: Not on file    Inability: Not on file  . Transportation needs:    Medical: Not on file    Non-medical: Not on file  Tobacco Use  . Smoking status: Current Some Day Smoker  . Smokeless tobacco: Never Used  . Tobacco comment: Marijuana  Substance and Sexual Activity  . Alcohol use: No  . Drug use: Yes    Types: Marijuana  . Sexual activity: Yes  Lifestyle  . Physical activity:    Days per week: Not on file    Minutes per session: Not on file  . Stress: Not on file  Relationships  . Social connections:    Talks on phone: Not on file    Gets together: Not on file    Attends religious service: Not on file    Active member of club or organization: Not on file    Attends meetings of clubs or organizations: Not on file    Relationship status: Not on file  . Intimate partner violence:    Fear of current or ex partner: Not on file    Emotionally abused: Not on file    Physically abused: Not on file    Forced sexual activity: Not on file  Other Topics Concern  . Not on file  Social History Narrative  . Not on file    FAMILY HISTORY: Family History  Problem Relation Age of Onset  . Diabetes Mother   . Colon cancer Maternal Grandmother   . Stomach cancer Maternal Grandmother   . Diabetes Maternal Grandmother   . Stomach cancer Maternal Grandfather   . Diabetes Maternal Grandfather     ALLERGIES:  is allergic to tylenol [acetaminophen].  MEDICATIONS:  Current Outpatient Medications  Medication Sig Dispense Refill  . apixaban (ELIQUIS) 5 MG TABS tablet Take 10 mg (2 tablets) oral twice daily for next 5 days, then transition to 5 mg (1 tablet) oral twice daily from 05/08/2018 70 tablet 0  . bisacodyl (DULCOLAX) 10 MG suppository Place 1  suppository (10 mg total) rectally every 8 (eight) hours as needed for moderate constipation. 12 suppository 0  . bisoprolol-hydrochlorothiazide (ZIAC) 10-6.25 MG tablet Take 1 tablet by mouth daily. 90 tablet 0  . Cholecalciferol (VITAMIN D3) 5000 units CAPS Take 1 capsule (5,000 Units total) by mouth every other day.    . ezetimibe (ZETIA) 10 MG tablet Take 1 tablet daily for Cholesterol 90 tablet 0  . famotidine (PEPCID) 20 MG tablet Take 1 tablet (20 mg total) by mouth 2 (two) times daily. 30 tablet 0  . hydrOXYzine (ATARAX/VISTARIL) 25 MG tablet Take 1 tablet (25 mg total) by mouth 3 (three) times daily as needed for anxiety. 30 tablet 0  . levothyroxine (SYNTHROID, LEVOTHROID) 75 MCG tablet take 1 tablet by mouth every morning ON AN EMPTY STOMACH 90 tablet 1  . omeprazole (PRILOSEC) 40 MG capsule Take 1 capsule (40 mg total) by mouth daily. Please schedule an Office visit for future refills: [phone] [id number]  capsule 2  . ondansetron (ZOFRAN) 8 MG tablet Take 1 tablet 3 x/ day before meals - Nausea 60 tablet 0  . oxyCODONE (OXY IR/ROXICODONE) 5 MG immediate release tablet Take 0.5 tablets (2.5 mg total) by mouth every 4 (four) hours as needed for severe pain. 15 tablet 0  . potassium chloride (K-DUR,KLOR-CON) 20 MEQ tablet Take 1 tablet (20 mEq total) by mouth daily. 30 tablet 0  . sertraline (ZOLOFT) 100 MG tablet Take 1 tablet (100 mg total) by mouth at bedtime. 30 tablet 0  . sucralfate (CARAFATE) 1 g tablet Take 1 tablet (1 g total) by mouth 2 (two) times daily. 60 tablet 1  . traZODone (DESYREL) 50 MG tablet Take 1 tablet (50 mg total) by mouth at bedtime as needed for sleep. 30 tablet 0   No current facility-administered medications for this visit.     REVIEW OF SYSTEMS:    10 Point review of Systems was done is negative except as noted above.  PHYSICAL EXAMINATION:   . Vitals:   05/21/18 1316  BP: 128/66  Pulse: 63  Resp: 18  Temp: 98.2 F (36.8 C)  SpO2: 100%   Filed  Weights   05/21/18 1316  Weight: 219 lb 9.6 oz (99.6 kg)   .Body mass index is 27.45 kg/m.  GENERAL:alert, in no acute distress and comfortable SKIN: no acute rashes, no significant lesions EYES: conjunctiva are pink and non-injected, sclera anicteric OROPHARYNX: MMM, no exudates, no oropharyngeal erythema or ulceration NECK: supple, no JVD LYMPH:  no palpable lymphadenopathy in the cervical, axillary or inguinal regions LUNGS: clear to auscultation b/l with normal respiratory effort HEART: regular rate & rhythm ABDOMEN:  normoactive bowel sounds, not distended. No overt tenderness or guarding.  Extremity: no pedal edema PSYCH: alert & oriented x 3 with fluent speech NEURO: no focal motor/sensory deficits  LABORATORY DATA:  I have reviewed the data as listed  . CBC Latest Ref Rng & Units 05/21/2018 05/14/2018 05/02/2018  WBC 4.0 - 10.3 K/uL 8.0 12.6(H) 8.6  Hemoglobin 13.0 - 17.1 g/dL 11.9(L) 12.1(L) 11.7(L)  Hematocrit 38.4 - 49.9 % 36.1(L) 36.2(L) 34.9(L)  Platelets 140 - 400 K/uL 221 248.0 146(L)    . CMP Latest Ref Rng & Units 05/21/2018 05/14/2018 05/02/2018  Glucose 70 - 99 mg/dL 92 127(H) 142(H)  BUN 6 - 20 mg/dL 6 8 8   Creatinine 0.61 - 1.24 mg/dL 0.92 0.93 0.83  Sodium 135 - 145 mmol/L 140 133(L) 137  Potassium 3.5 - 5.1 mmol/L 4.1 4.0 2.9(L)  Chloride 98 - 111 mmol/L 104 96 102  CO2 22 - 32 mmol/L 26 27 26   Calcium 8.9 - 10.3 mg/dL 9.4 9.7 8.8(L)  Total Protein 6.5 - 8.1 g/dL 7.6 8.1 7.0  Total Bilirubin 0.3 - 1.2 mg/dL 0.5 1.1 1.7(H)  Alkaline Phos 38 - 126 U/L 115 156(H) 130(H)  AST 15 - 41 U/L 14(L) 19 28  ALT 0 - 44 U/L 23 41 35     RADIOGRAPHIC STUDIES: I have personally reviewed the radiological images as listed and agreed with the findings in the report. Mr Abdomen W Or Wo Contrast  Result Date: 04/26/2018 CLINICAL DATA:  Followup cholecystitis and cholangitis. History of primary sclerosing cholangitis. EXAM: MRI ABDOMEN WITHOUT AND WITH CONTRAST  TECHNIQUE: Multiplanar multisequence MR imaging of the abdomen was performed both before and after the administration of intravenous contrast. CONTRAST:  108mL MULTIHANCE GADOBENATE DIMEGLUMINE 529 MG/ML IV SOLN COMPARISON:  Ultrasound 04/24/2018.  MRI 11/20/2017 and 02/13/2017. FINDINGS:  Lower chest:  The visualized lower chest appears unremarkable. Hepatobiliary: Typical hemangiomas are again noted within the right hepatic lobe, measuring 2.0 cm on image 15/5 and 13 mm on image 25/5. These demonstrate typical enhancement. There are no suspicious hepatic findings. There is stable mild intrahepatic biliary dilatation and irregularity consistent with sclerosing cholangitis. There is dependent sludge within the gallbladder. No evidence of choledocholithiasis or extrahepatic biliary dilatation. 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 Pancreas: Unremarkable. No pancreatic ductal dilatation or surrounding inflammatory changes. Spleen: Normal in size without focal abnormality. Adrenals/Urinary Tract: Both adrenal glands appear normal. The kidneys appear normal without evidence of urinary tract calculus or hydronephrosis. Bladder not imaged. Stomach/Bowel: The stomach appears decompressed. There are multiple mildly dilated loops of small bowel in the mid abdomen. The visualized colon appears normal in caliber. Vascular/Lymphatic: There are no enlarged abdominal lymph nodes. No acute vascular findings are seen. 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 Other: There is a small amount of ascites which appears new. No focal extraluminal fluid collection seen. Musculoskeletal: No acute or significant osseous findings. IMPRESSION: 1. Mild small bowel dilatation and ascites. Findings could be secondary to ileus and peritonitis. Radiographic follow up recommended to exclude early obstruction. 2. Stable appearance of the biliary system consistent with sclerosing cholangitis. No  significant biliary dilatation or evidence of choledocholithiasis. Mild gallbladder sludge. 3. 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 4. Stable hepatic hemangiomas. Electronically Signed   By: Richardean Sale M.D.   On: 04/26/2018 23:15   US Abdomen Complete  Result Date: 04/25/2018 CLINICAL DATA:  Abdominal pain today. History of primary sclerosing cholangitis. EXAM: ABDOMEN ULTRASOUND COMPLETE COMPARISON:  MRI of the liver November 20, 2017. FINDINGS: Gallbladder: Layering echogenic gallbladder sludge without discrete gallstone. No pericholecystic fluid. No gallbladder wall thickening. No sonographic Murphy sign elicited. Common bile duct: Diameter: 4 mm. Liver: Echogenic hepatic lesions measuring to 2.7 cm previously characterized as hemangiomas. Mildly prominent intrahepatic biliary dilatation, unchanged. Within normal limits in parenchymal echogenicity. Portal vein is patent on color Doppler imaging with normal direction of blood flow towards the liver. 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 Portal vein is not discretely identified. IVC: No abnormality visualized. Pancreas: Visualized portion unremarkable. Spleen: Size and appearance within normal limits. Right Kidney: Length: 12 cm. Echogenicity within normal limits. No mass or hydronephrosis visualized. Left Kidney: Length: 12.1 cm. Echogenicity within normal limits. No mass or hydronephrosis visualized. Abdominal aorta: No aneurysm visualized. Other findings: None. IMPRESSION: 1. Gallbladder sludge without sonographic findings of acute cholecystitis. 2. Chronic cavernous transformation of portal vein. 3. Chronic biliary duct dilatation consistent with known sclerosing cholangitis. Electronically Signed   By: Elon Alas M.D.   On: 04/25/2018 00:39   Ct Abdomen Pelvis W Contrast  Result Date: 04/29/2018 CLINICAL DATA:  Inpatient. Generalized mid upper abdominal pain. 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 EXAM: CT ABDOMEN AND  PELVIS WITH CONTRAST TECHNIQUE: Multidetector CT imaging of the abdomen and pelvis was performed using the standard protocol following bolus administration of intravenous contrast. CONTRAST:  182mL ISOVUE-300 IOPAMIDOL (ISOVUE-300) INJECTION 61% COMPARISON:  04/27/2018 CT abdomen/pelvis.  04/26/2018 MRI abdomen. FINDINGS: Lower chest: No significant pulmonary nodules or acute consolidative airspace disease. 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 No new liver masses. Stable caudate lobe hypertrophy without definite liver surface irregularity. Hyperdense material in fundal gallbladder is compatible with  sludge or tiny stones. No gallbladder wall thickening or significant pericholecystic fluid. Mild diffuse intrahepatic biliary ductal dilatation, most prominent in the left liver lobe, with relative sparing of caudate and right liver lobe not convincingly changed back to 01/12/2017 MRI. Stable top-normal CBD diameter 6 mm. Pancreas: Normal, with no mass or duct dilation. Spleen: Normal size. No mass. Adrenals/Urinary Tract: Normal adrenals. Normal kidneys with no hydronephrosis and no renal mass. Collapsed normal bladder. Stomach/Bowel: Normal non-distended stomach. There are several mildly dilated small bowel loops throughout the abdomen with air-fluid levels measuring up to 3.8 cm diameter, not appreciably changed. Some of these dilated small bowel loops demonstrate mild small bowel wall thickening, mildly increased. No focal small bowel caliber transition. No definite pneumatosis. Appendix is mildly dilated (10 mm diameter), mildly increased. Borderline mild diffuse appendiceal wall thickening and hyperenhancement with mild periappendiceal fluid. Fluid levels and mild gas scattered throughout the right and transverse colon. No large bowel wall  thickening or diverticular disease. Vascular/Lymphatic: Normal caliber abdominal aorta. 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 Patent splenic vein. 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 Patent renal veins. No pathologically enlarged lymph nodes in the abdomen or pelvis. Reproductive: Normal size prostate. Other: No pneumoperitoneum.  Small volume pelvic ascites. Musculoskeletal: No aggressive appearing focal osseous lesions. IMPRESSION: 1. 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 2. 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 3. Persistent mild dilatation of small bowel loops throughout the abdomen with air-fluid levels. Increased mild small bowel wall thickening. No definite pneumatosis. Small bowel ischemia cannot be excluded. No free air. 4. Nonspecific dilatation and mild wall thickening in the appendix, mildly increased. 5. Small volume pelvic ascites. 6. Chronic mild intrahepatic biliary ductal dilatation is unchanged back to 2018 MRI studies. No acute biliary abnormality. These results were called by telephone at the time of interpretation on 04/29/2018 at 12:48 pm to Dr. Doreatha Lew , who verbally acknowledged these results. Electronically Signed   By: Ilona Sorrel M.D.   On: 04/29/2018 12:54   Ct Abdomen Pelvis W Contrast  Result Date: 04/27/2018 CLINICAL DATA:  Abdominal pain with chills but no fever for 3 days. Bowel dilatation and ascites on MRCP. History of sclerosing cholangitis. EXAM: CT ABDOMEN AND PELVIS WITH CONTRAST TECHNIQUE: Multidetector CT imaging of the abdomen and pelvis was performed using the standard protocol following bolus administration of intravenous contrast. CONTRAST:  137mL ISOVUE-300 IOPAMIDOL (ISOVUE-300) INJECTION 61% COMPARISON:  MRI 04/26/2018 and 11/20/2017.  FINDINGS: Lower chest: Mild linear atelectasis at both lung bases. No significant pleural or pericardial effusion. Hepatobiliary: There are known hemangiomas within the right hepatic lobe which are stable. There is stable mild intrahepatic biliary dilatation and irregularity consistent with sclerosing cholangitis. Dependent high-density sludge is present in the gallbladder lumen. There is no extrahepatic biliary dilatation. Pancreas: Unremarkable. No pancreatic ductal dilatation or surrounding inflammatory changes. Spleen: Normal in size without focal abnormality. Adrenals/Urinary Tract: Both adrenal glands appear normal. Both kidneys appear normal. No evidence of urinary tract calculus or hydronephrosis. There is contrast material within the bladder. No evidence of bladder wall thickening. Stomach/Bowel: The initial portal phase images are motion degraded (due to patient vomiting) and no enteric contrast was administered. Delayed imaging was performed through the pelvis. No significant small or large bowel wall thickening, distention or surrounding inflammation. Because of these limitations, confident identification of the appendix is  limited, although there is an apparent blind ending tubular structure extending into the midline pelvis which measures up to 11 mm in diameter on image 65/4. This is also seen on coronal images 59 and 60 of series 9. Vascular/Lymphatic: There are no enlarged abdominal or pelvic lymph nodes. No acute vascular findings. 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 Reproductive: The prostate gland and seminal vesicles appear normal. Other: Small amount of ascites, predominately in the pelvis and subhepatic space. No focal extraluminal fluid collection. There is mild mesenteric edema. Musculoskeletal: No acute or significant osseous findings. IMPRESSION: 1. Study is limited by motion and the lack of enteric contrast. There is possible inflammation of the appendix in the low  pelvis, and acute appendicitis cannot be excluded. Surgical evaluation recommended. 2. There is mild ascites and nonspecific mesenteric edema. No focal extraluminal fluid collection or bowel obstruction identified. 3. Stable appearance of the liver with mild intrahepatic biliary dilatation and irregularity attributed to sclerosing cholangitis. 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 4. These results were called by telephone at the time of interpretation on 04/27/2018 at 3:02 am to the patient's nurse Corliss Blacker, who verbally acknowledged these results. Electronically Signed   By: Richardean Sale M.D.   On: 04/27/2018 03:02   Dg Abd Portable 1v  Result Date: 04/28/2018 CLINICAL DATA:  Evaluate ileus. EXAM: PORTABLE ABDOMEN - 1 VIEW COMPARISON:  04/27/2018. FINDINGS: Diffuse gaseous distension of the large and small bowel loops are identified. This appears increased from previous exam with increase in diameter of small bowel loops. IMPRESSION: 1. Interval progression of ileus pattern. Electronically Signed   By: Kerby Moors M.D.   On: 04/28/2018 07:55    ASSESSMENT & PLAN:  27 y.o. male with  1. 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 PLAN -Discussed patient's most recent labs from 05/14/18, HGB at 12.1, ANC at 10.2k. PLT normal at 221k. 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  Persistent mild dilatation of small bowel loops throughout the abdomen with air-fluid levels. Increased mild small bowel wall thickening. No definite pneumatosis. Small bowel ischemia cannot be excluded. No free air. Nonspecific dilatation and mild wall thickening in the appendix, mildly increased. Small volume pelvic ascites. Chronic mild intrahepatic biliary ductal  dilatation is unchanged back to 2018 MRI studies. No acute biliary abnormality.  -Follow up with repeat CT A/P on 05/24/18  -Lupus anticoagulant was positive on 04/29/18, possibly in the setting of the pt already having begun Eliquis -Factor V, Prothrombin both negative -Protein S was low in the setting of active blood clot -Complete 6 months of Eliquis -Will check labs again in 3 months, and pt will hold Eliquis two days prior to repeat Lupus anticoagulant, Protein S, and other labs -If antiphospholipid antibodies are positive again, will indicate long term blood thinner use    -RTC with Dr Irene Limbo in 3 months with labs (Plz schedule labs 1 week prior to f/u) -Patient to Hold Eliquis for 48hours prior to f/u labs   . Orders Placed This Encounter  Procedures  . CBC with Differential/Platelet    Standing Status:   Future    Standing Expiration Date:   06/25/2019  . CMP ([place] only)    Standing Status:   Future    Standing Expiration Date:   05/22/2019  .  Protein S activity    Standing Status:   Future    Standing Expiration Date:   05/22/2019  . Protein S, total and free    Standing Status:   Future    Standing Expiration Date:   05/22/2019  . Lupus anticoagulant panel    Standing Status:   Future    Standing Expiration Date:   06/25/2019  . Cardiolipin antibodies, IgG, IgM, IgA    Standing Status:   Future    Standing Expiration Date:   05/22/2019     All of the patients questions were answered with apparent satisfaction. The patient knows to call the clinic with any problems, questions or concerns.  The total time spent in the appt was 45 minutes and more than 50% was on counseling and direct patient cares.    Sullivan Lone MD MS AAHIVMS [facility] Hematology/Oncology Physician [facility]  (Office):       [phone] (Work cell):  [phone] (Fax):           [phone]  05/21/2018 2:03 PM  I, Baldwin [place], am acting as a scribe for Dr. Irene Limbo  .I have reviewed  the above documentation for accuracy and completeness, and I agree with the above. Brunetta Genera MD

## 2018-05-21 ENCOUNTER — Inpatient Hospital Stay: Payer: 59

## 2018-05-21 ENCOUNTER — Inpatient Hospital Stay: Payer: 59 | Attending: Hematology | Admitting: Hematology

## 2018-05-21 ENCOUNTER — Encounter: Payer: Self-pay | Admitting: Hematology

## 2018-05-21 ENCOUNTER — Telehealth: Payer: Self-pay

## 2018-05-21 ENCOUNTER — Other Ambulatory Visit: Payer: Self-pay

## 2018-05-21 VITALS — BP 128/66 | HR 63 | Temp 98.2°F | Resp 18 | Ht 75.0 in | Wt 219.6 lb

## 2018-05-21 DIAGNOSIS — IMO0002 Reserved for concepts with insufficient information to code with codable children: Secondary | ICD-10-CM

## 2018-05-21 DIAGNOSIS — K55069 Acute infarction of intestine, part and extent unspecified: Secondary | ICD-10-CM

## 2018-05-21 DIAGNOSIS — I81 Portal vein thrombosis: Secondary | ICD-10-CM | POA: Insufficient documentation

## 2018-05-21 DIAGNOSIS — Z800 Family history of malignant neoplasm of digestive organs: Secondary | ICD-10-CM | POA: Insufficient documentation

## 2018-05-21 DIAGNOSIS — R760 Raised antibody titer: Secondary | ICD-10-CM | POA: Diagnosis not present

## 2018-05-21 DIAGNOSIS — Z720 Tobacco use: Secondary | ICD-10-CM | POA: Diagnosis not present

## 2018-05-21 LAB — CBC WITH DIFFERENTIAL (CANCER CENTER ONLY)
BASOS PCT: 0 %
Basophils Absolute: 0 10*3/uL (ref 0.0–0.1)
EOS ABS: 0 10*3/uL (ref 0.0–0.5)
Eosinophils Relative: 0 %
HCT: 36.1 % — ABNORMAL LOW (ref 38.4–49.9)
Hemoglobin: 11.9 g/dL — ABNORMAL LOW (ref 13.0–17.1)
Lymphocytes Relative: 18 %
Lymphs Abs: 1.4 10*3/uL (ref 0.9–3.3)
MCH: 29.2 pg (ref 27.2–33.4)
MCHC: 33 g/dL (ref 32.0–36.0)
MCV: 88.5 fL (ref 79.3–98.0)
MONO ABS: 0.7 10*3/uL (ref 0.1–0.9)
Monocytes Relative: 9 %
NEUTROS ABS: 5.9 10*3/uL (ref 1.5–6.5)
NEUTROS PCT: 73 %
Platelet Count: 221 10*3/uL (ref 140–400)
RBC: 4.08 MIL/uL — AB (ref 4.20–5.82)
RDW: 15 % — ABNORMAL HIGH (ref 11.0–14.6)
WBC Count: 8 10*3/uL (ref 4.0–10.3)

## 2018-05-21 LAB — CMP (CANCER CENTER ONLY)
ALT: 23 U/L (ref 0–44)
AST: 14 U/L — AB (ref 15–41)
Albumin: 3.6 g/dL (ref 3.5–5.0)
Alkaline Phosphatase: 115 U/L (ref 38–126)
Anion gap: 10 (ref 5–15)
BUN: 6 mg/dL (ref 6–20)
CHLORIDE: 104 mmol/L (ref 98–111)
CO2: 26 mmol/L (ref 22–32)
Calcium: 9.4 mg/dL (ref 8.9–10.3)
Creatinine: 0.92 mg/dL (ref 0.61–1.24)
GFR, Est AFR Am: >60 mL/min (ref >60)
Glucose, Bld: 92 mg/dL (ref 70–99)
POTASSIUM: 4.1 mmol/L (ref 3.5–5.1)
SODIUM: 140 mmol/L (ref 135–145)
Total Bilirubin: 0.5 mg/dL (ref 0.3–1.2)
Total Protein: 7.6 g/dL (ref 6.5–8.1)

## 2018-05-21 MED ORDER — APIXABAN 5 MG PO TABS: 5.0000 mg | ORAL_TABLET | Freq: Two times a day (BID) | ORAL | 2 refills | Status: DC

## 2018-05-21 NOTE — Telephone Encounter (Signed)
Printed avs and aclender of upcoming appointment. Per 8/6 los

## 2018-05-24 ENCOUNTER — Telehealth: Payer: Self-pay | Admitting: *Deleted

## 2018-05-24 ENCOUNTER — Inpatient Hospital Stay: Admission: RE | Admit: 2018-05-24 | Payer: Self-pay | Source: Ambulatory Visit

## 2018-05-24 NOTE — Telephone Encounter (Signed)
Per Marzetta Board at [facility] CT, this patient saw Nicoletta Ba PA on 05-14-2018. Amy ordered a CT scan of Abd & Pelvis with Contrast for  mesenteric vein thrombosis. The patient has no showed on two occasions.  May 17, 2018 . Stacy left a message when she called with the mother of the patient.  Also he no showed on 05-24-2018, the mother and the patient were aware.

## 2018-05-24 NOTE — Telephone Encounter (Signed)
Per Nicoletta Ba PA, advised Kyle Gonzales at [place] [facility] to cancel the Ct scan at this point, since the patient has no showed twice.

## 2018-05-29 ENCOUNTER — Ambulatory Visit (HOSPITAL_COMMUNITY): Payer: 59 | Admitting: Psychiatry

## 2018-06-04 ENCOUNTER — Ambulatory Visit: Payer: Self-pay | Admitting: Adult Health

## 2018-06-04 NOTE — Progress Notes (Signed)
FOLLOW UP  Assessment and Plan:   Hypertension Mildly elevated but on review has been well controlled; likely due to feeling unwell today Monitor blood pressure at home; patient to call if consistently greater than 130/80 Continue DASH diet.   Reminder to go to the ER if any CP, SOB, nausea, dizziness, severe HA, changes vision/speech, left arm numbness and tingling and jaw pain.  Cholesterol Currently at goal; continue zetia Continue low cholesterol diet and exercise.  Check lipid panel.   Hypothyroidism continue medications the same pending lab results reminded to take on an empty stomach 30-47mins before food.  check TSH level  Vitamin D Def Near goal at last visit; continue supplementation Defer Vit D level  Depression/anxiety Continue medications; follow up with psych Lifestyle discussed: diet/exerise, sleep hygiene, stress management, hydration  Marijuana use Advised he stop using marijuana; seems to be contributing to vague nausea Check labs; blood count, CMP/GFR  Continue diet and meds as discussed. Further disposition pending results of labs. Discussed med's effects and SE's.   Over 30 minutes of exam, counseling, chart review, and critical decision making was performed.   Future Appointments  Date Time Provider [place]  08/16/2018 11:00 AM [facility] 3 [facility] None  08/23/2018 11:20 AM Brunetta Genera, MD [facility] None  09/04/2018  2:30 PM [name], MD [facility] None  03/05/2019  3:45 PM [name], MD [facility] None    ----------------------------------------------------------------------------------------------------------------------  HPI 27 y.o. male  presents for 3 month follow up on hypertension, cholesterol, vitamin D deficiency, hypothyroidism (Grave's), major depression. He also has  diagnosis of small duct PSC, with normal liver biopsy and abnormal imaging.  He is currently being followed by Dr. Zollie Scale  /hepatology [facility]. He also has recent hospitalization with acute abdominal pain and finding of an acute SMV thrombus, and associated ileus/dilated bowel and could not absolutely rule out component of ischemia. Hypercoag workup in hospital indicated some abnormalities (+protein S, +lupus antigoalgulant) and is on elequis, seeing Dr. Irene Limbo (hem) for further workup with recheck planned while holding elequis; if repeat + antiphospholipid antibodies anticipate long term blood thinner.    Today he presents reporting feeling weak, feeling mildly off since yesterday. He admits to smoking marijuana and feels may be related, occasionally feels this way after. He reports he made himself throw up this AM thinking this would make him feel better but didn't help. Denies nausea/emesis, diarrhea, BM character changes, mucus, hematochezia, constipation, fever/chills or other new symptoms.   He has major depression and has been started on zoloft 100 mg daily; he has been seeing psych for this but hasn't followed up amid hospitalizations.   He does not check BP at home, today their BP is BP: (!) 144/88  He does not workout. He denies chest pain, shortness of breath, dizziness.   He is on cholesterol medication (zetia 10 mg daily) and denies myalgias. His cholesterol is at goal. The cholesterol last visit was:   Lab Results  Component Value Date   CHOL 183 02/04/2018   HDL 59 02/04/2018   LDLCALC 101 (H) 02/04/2018   TRIG 124 02/04/2018   CHOLHDL 3.1 02/04/2018   Last A1C in the office was:  Lab Results  Component Value Date   HGBA1C 5.5 02/04/2018   He is on thyroid medication. His medication was not changed last visit; takes 1st thing in the morning with water, doesn't eat till lunch.    Lab Results  Component Value Date   TSH 5.591 (  H) 04/27/2018   Patient is on Vitamin D supplement.   Lab Results  Component Value Date   VD25OH 67 02/04/2018        Current Medications:  Current  Outpatient Medications on File Prior to Visit  Medication Sig  . apixaban (ELIQUIS) 5 MG TABS tablet Take 1 tablet (5 mg total) by mouth 2 (two) times daily.  . bisacodyl (DULCOLAX) 10 MG suppository Place 1 suppository (10 mg total) rectally every 8 (eight) hours as needed for moderate constipation.  . bisoprolol-hydrochlorothiazide (ZIAC) 10-6.25 MG tablet Take 1 tablet by mouth daily.  . Cholecalciferol (VITAMIN D3) 5000 units CAPS Take 1 capsule (5,000 Units total) by mouth every other day.  . famotidine (PEPCID) 20 MG tablet Take 1 tablet (20 mg total) by mouth 2 (two) times daily.  . hydrOXYzine (ATARAX/VISTARIL) 25 MG tablet Take 1 tablet (25 mg total) by mouth 3 (three) times daily as needed for anxiety.  Marland Kitchen levothyroxine (SYNTHROID, LEVOTHROID) 75 MCG tablet take 1 tablet by mouth every morning ON AN EMPTY STOMACH  . omeprazole (PRILOSEC) 40 MG capsule Take 1 capsule (40 mg total) by mouth daily. Please schedule an Office visit for future refills: [phone]  . ondansetron (ZOFRAN) 8 MG tablet Take 1 tablet 3 x/ day before meals - Nausea  . oxyCODONE (OXY IR/ROXICODONE) 5 MG immediate release tablet Take 0.5 tablets (2.5 mg total) by mouth every 4 (four) hours as needed for severe pain.  . potassium chloride (K-DUR,KLOR-CON) 20 MEQ tablet Take 1 tablet (20 mEq total) by mouth daily.  . sertraline (ZOLOFT) 100 MG tablet Take 1 tablet (100 mg total) by mouth at bedtime.  . sucralfate (CARAFATE) 1 g tablet Take 1 tablet (1 g total) by mouth 2 (two) times daily.  . traZODone (DESYREL) 50 MG tablet Take 1 tablet (50 mg total) by mouth at bedtime as needed for sleep.   No current facility-administered medications on file prior to visit.      Allergies:  Allergies  Allergen Reactions  . Tylenol [Acetaminophen]     r/t elevated LFTs      Medical History:  Past Medical History:  Diagnosis Date  . GERD (gastroesophageal reflux disease)   . Graves disease 08/18/2015  . Hyperlipidemia    . Hypothyroid   . Palpitations   . Vitamin D deficiency    Family history- Reviewed and unchanged Social history- Reviewed and unchanged   Review of Systems:  Review of Systems  Constitutional: Negative for malaise/fatigue and weight loss.  HENT: Negative for hearing loss and tinnitus.   Eyes: Negative for blurred vision and double vision.  Respiratory: Negative for cough, shortness of breath and wheezing.   Cardiovascular: Negative for chest pain, palpitations, orthopnea, claudication and leg swelling.  Gastrointestinal: Negative for abdominal pain, blood in stool, constipation, diarrhea, heartburn, melena, nausea and vomiting.       Vague sense of nausea without emesis, "made myself throw up this morning" - no other GI symptoms  Genitourinary: Negative.   Musculoskeletal: Negative for joint pain and myalgias.  Skin: Negative for rash.  Neurological: Negative for dizziness, tingling, sensory change, weakness and headaches.  Endo/Heme/Allergies: Negative for polydipsia.  Psychiatric/Behavioral: Positive for depression and substance abuse. Negative for hallucinations and suicidal ideas. The patient is not nervous/anxious and does not have insomnia.   All other systems reviewed and are negative.   Physical Exam: BP (!) 144/88   Pulse (!) 108   Temp (!) 97.5 F (36.4 C)   Ht  6\' 3"  (1.905 m)   Wt 214 lb (97.1 kg)   SpO2 99%   BMI 26.75 kg/m  Wt Readings from Last 3 Encounters:  06/05/18 214 lb (97.1 kg)  05/21/18 219 lb 9.6 oz (99.6 kg)  05/14/18 219 lb 2 oz (99.4 kg)   General Appearance: Well nourished, appears to be feeling unwell, lying back on exam table, in no acute distress. Eyes: PERRLA, EOMs, conjunctiva no swelling or erythema Sinuses: No Frontal/maxillary tenderness ENT/Mouth: Ext aud canals clear, TMs without erythema, bulging. No erythema, swelling, or exudate on post pharynx.  Tonsils not swollen or erythematous. Hearing normal.  Neck: Supple, thyroid normal.   Respiratory: Respiratory effort normal, BS equal bilaterally without rales, rhonchi, wheezing or stridor.  Cardio: RRR with no MRGs. Brisk peripheral pulses without edema.  Abdomen: Soft, + BS.  Vague generalized mild tenderness, no guarding, rebound, hernias, masses. Lymphatics: Non tender without lymphadenopathy.  Musculoskeletal: Full ROM, 5/5 strength, Normal gait Skin: Warm, dry without rashes, lesions, ecchymosis.  Neuro: Cranial nerves intact. No cerebellar symptoms.  Psych: Awake and oriented X 3, depressed affect, Insight and Judgment appropriate.    Izora Ribas, NP 2:11 PM [facility]

## 2018-06-05 ENCOUNTER — Ambulatory Visit (INDEPENDENT_AMBULATORY_CARE_PROVIDER_SITE_OTHER): Payer: 59 | Admitting: Adult Health

## 2018-06-05 ENCOUNTER — Other Ambulatory Visit: Payer: Self-pay | Admitting: Internal Medicine

## 2018-06-05 ENCOUNTER — Encounter: Payer: Self-pay | Admitting: Adult Health

## 2018-06-05 VITALS — BP 144/88 | HR 108 | Temp 97.5°F | Ht 75.0 in | Wt 214.0 lb

## 2018-06-05 DIAGNOSIS — I10 Essential (primary) hypertension: Secondary | ICD-10-CM | POA: Diagnosis not present

## 2018-06-05 DIAGNOSIS — R945 Abnormal results of liver function studies: Secondary | ICD-10-CM

## 2018-06-05 DIAGNOSIS — E039 Hypothyroidism, unspecified: Secondary | ICD-10-CM

## 2018-06-05 DIAGNOSIS — F332 Major depressive disorder, recurrent severe without psychotic features: Secondary | ICD-10-CM

## 2018-06-05 DIAGNOSIS — E559 Vitamin D deficiency, unspecified: Secondary | ICD-10-CM

## 2018-06-05 DIAGNOSIS — E0500 Thyrotoxicosis with diffuse goiter without thyrotoxic crisis or storm: Secondary | ICD-10-CM

## 2018-06-05 DIAGNOSIS — R7989 Other specified abnormal findings of blood chemistry: Secondary | ICD-10-CM

## 2018-06-05 DIAGNOSIS — E782 Mixed hyperlipidemia: Secondary | ICD-10-CM

## 2018-06-05 DIAGNOSIS — Z79899 Other long term (current) drug therapy: Secondary | ICD-10-CM

## 2018-06-05 NOTE — Patient Instructions (Signed)
Goals    . Blood Pressure < 130/80    . LDL CALC < 100       Aim for 5-10 servings of fruits and vegetables daily  65-80+ fluid ounces of water or unsweet tea for healthy kidneys  Limit to max 1 drink of alcohol per day; avoid smoking/tobacco  Limit animal fats in diet for cholesterol and heart health - choose grass fed whenever available  Avoid highly processed foods, and foods high in saturated/trans fats  Aim for low stress - take time to unwind and care for your mental health  Aim for 150 min of moderate intensity exercise weekly for heart health, and weights twice weekly for bone health  Aim for 7-9 hours of sleep daily      When it comes to diets, agreement about the perfect plan isn't easy to find, even among the experts. Experts at the [employer] developed an idea known as the Healthy Eating Plate. Just imagine a plate divided into logical, healthy portions.  The emphasis is on diet quality:  Load up on vegetables and fruits - one-half of your plate: Aim for color and variety, and remember that potatoes don't count.  Go for whole grains - one-quarter of your plate: Whole wheat, barley, wheat berries, quinoa, oats, brown rice, and foods made with them. If you want pasta, go with whole wheat pasta.  Protein power - one-quarter of your plate: Fish, chicken, beans, and nuts are all healthy, versatile protein sources. Limit red meat.  The diet, however, does go beyond the plate, offering a few other suggestions.  Use healthy plant oils, such as olive, canola, soy, corn, sunflower and peanut. Check the labels, and avoid partially hydrogenated oil, which have unhealthy trans fats.  If you're thirsty, drink water. Coffee and tea are good in moderation, but skip sugary drinks and limit milk and dairy products to one or two daily servings.  The type of carbohydrate in the diet is more important than the amount. Some sources of carbohydrates, such as  vegetables, fruits, whole grains, and beans-are healthier than others.  Finally, stay active.

## 2018-06-06 ENCOUNTER — Other Ambulatory Visit: Payer: Self-pay | Admitting: Adult Health

## 2018-06-06 LAB — CBC WITH DIFFERENTIAL/PLATELET
BASOS PCT: 0.3 %
Basophils Absolute: 21 cells/uL (ref 0–200)
EOS ABS: 0 {cells}/uL — AB (ref 15–500)
Eosinophils Relative: 0 %
HEMATOCRIT: 34.9 % — AB (ref 38.5–50.0)
HEMOGLOBIN: 11.6 g/dL — AB (ref 13.2–17.1)
LYMPHS ABS: 689 {cells}/uL — AB (ref 850–3900)
MCH: 28.4 pg (ref 27.0–33.0)
MCHC: 33.2 g/dL (ref 32.0–36.0)
MCV: 85.5 fL (ref 80.0–100.0)
MPV: 11.1 fL (ref 7.5–12.5)
Monocytes Relative: 13.8 %
Neutro Abs: 5410 cells/uL (ref 1500–7800)
Neutrophils Relative %: 76.2 %
Platelets: 173 10*3/uL (ref 140–400)
RBC: 4.08 10*6/uL — ABNORMAL LOW (ref 4.20–5.80)
RDW: 14.8 % (ref 11.0–15.0)
TOTAL LYMPHOCYTE: 9.7 %
WBC: 7.1 10*3/uL (ref 3.8–10.8)
WBCMIX: 980 {cells}/uL — AB (ref 200–950)

## 2018-06-06 LAB — LIPID PANEL
Cholesterol: 178 mg/dL (ref <200)
HDL: 49 mg/dL (ref >40)
LDL Cholesterol (Calc): 110 mg/dL (calc) — ABNORMAL HIGH
Non-HDL Cholesterol (Calc): 129 mg/dL (calc) (ref <130)
TRIGLYCERIDES: 93 mg/dL (ref <150)
Total CHOL/HDL Ratio: 3.6 (calc) (ref <5.0)

## 2018-06-06 LAB — COMPLETE METABOLIC PANEL WITH GFR
AG RATIO: 1.2 (calc) (ref 1.0–2.5)
ALBUMIN MSPROF: 4.3 g/dL (ref 3.6–5.1)
ALT: 132 U/L — ABNORMAL HIGH (ref 9–46)
AST: 23 U/L (ref 10–40)
Alkaline phosphatase (APISO): 205 U/L — ABNORMAL HIGH (ref 40–115)
BILIRUBIN TOTAL: 1.4 mg/dL — AB (ref 0.2–1.2)
BUN: 9 mg/dL (ref 7–25)
CHLORIDE: 100 mmol/L (ref 98–110)
CO2: 24 mmol/L (ref 20–32)
Calcium: 9.7 mg/dL (ref 8.6–10.3)
Creat: 0.98 mg/dL (ref 0.60–1.35)
GFR, EST AFRICAN AMERICAN: 124 mL/min/{1.73_m2} (ref >=60)
GFR, EST NON AFRICAN AMERICAN: 107 mL/min/{1.73_m2} (ref >=60)
GLOBULIN: 3.5 g/dL (ref 1.9–3.7)
Glucose, Bld: 122 mg/dL — ABNORMAL HIGH (ref 65–99)
POTASSIUM: 3.9 mmol/L (ref 3.5–5.3)
SODIUM: 136 mmol/L (ref 135–146)
Total Protein: 7.8 g/dL (ref 6.1–8.1)

## 2018-06-06 LAB — TSH: TSH: 5.71 mIU/L — ABNORMAL HIGH (ref 0.40–4.50)

## 2018-06-06 LAB — MAGNESIUM: Magnesium: 1.7 mg/dL (ref 1.5–2.5)

## 2018-06-06 MED ORDER — LEVOTHYROXINE SODIUM 75 MCG PO TABS: ORAL_TABLET | ORAL | 1 refills | Status: DC

## 2018-06-10 ENCOUNTER — Telehealth: Payer: Self-pay

## 2018-06-10 NOTE — Telephone Encounter (Signed)
I called patients mother to let her know that the co-pay card is here at the desk for her to pick up.  She said she would come by to get it.

## 2018-06-21 ENCOUNTER — Other Ambulatory Visit: Payer: Self-pay

## 2018-06-21 ENCOUNTER — Ambulatory Visit: Payer: Self-pay | Admitting: Gastroenterology

## 2018-06-21 MED ORDER — BISOPROLOL-HYDROCHLOROTHIAZIDE 10-6.25 MG PO TABS: 1.0000 | ORAL_TABLET | Freq: Every day | ORAL | 1 refills | Status: DC

## 2018-06-27 ENCOUNTER — Ambulatory Visit (INDEPENDENT_AMBULATORY_CARE_PROVIDER_SITE_OTHER): Payer: 59 | Admitting: Licensed Clinical Social Worker

## 2018-06-27 DIAGNOSIS — F321 Major depressive disorder, single episode, moderate: Secondary | ICD-10-CM | POA: Diagnosis not present

## 2018-07-01 NOTE — Progress Notes (Signed)
Comprehensive Clinical Assessment (CCA) Note  07/01/2018 Kyle Gonzales [id number]  Visit Diagnosis:      ICD-10-CM   1. 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  (See scanned document in Chart Review)  CCA Part Two A  Intake/Chief Complaint:  CCA Intake With Chief Complaint CCA Part Two Date: 07/04/18 Chief Complaint/Presenting Problem: Client presented as referral after previous hospitalization due to [place]. client reported he has struggled with depression and anxiety. recently punched wall out of frustration Patients Currently Reported Symptoms/Problems: depression, anxiety, cannabis use to self medicate; reports having OCD sx Collateral Involvement: mother is supportive, brought client to appointment, did not attend appointment Individual's Strengths: can make friends, previously employed Individual's Preferences: 'get out of my head and stop thinking this way' Individual's Abilities: hx of employment and school attendance Type of Services Patient Feels Are Needed: outpatient therapy, medication management continued Initial Clinical Notes/Concerns: client reports no significant symtpoms today; states ' this usually happens on therapy days i feel fine.'  Mental Health Symptoms Depression:  Depression: Change in energy/activity, Difficulty Concentrating  Mania:  Mania: N/A  Anxiety:   Anxiety: Difficulty concentrating, Irritability, Worrying  Psychosis:  Psychosis: N/A  Trauma:  Trauma: Emotional numbing, Irritability/anger  Obsessions:  Obsessions: Cause anxiety, Poor insight  Compulsions:  Compulsions: Not connected to stressor, Poor Insight  Inattention:  Inattention: Poor follow-through on tasks  Hyperactivity/Impulsivity:  Hyperactivity/Impulsivity: Fidgets with hands/feet, Feeling of restlessness  Oppositional/Defiant Behaviors:  Oppositional/Defiant  Behaviors: N/A  Borderline Personality:  Emotional Irregularity: Chronic feelings of emptiness, Intense/inappropriate anger  Other Mood/Personality Symptoms:      Mental Status Exam Appearance and self-care  Stature:  Stature: Average  Weight:     Clothing:  Clothing: Casual  Grooming:  Grooming: Normal  Cosmetic use:  Cosmetic Use: None  Posture/gait:  Posture/Gait: Normal  Motor activity:  Motor Activity: Not Remarkable  Sensorium  Attention:  Attention: Distractible(required moderate re-direction to focus on topics)  Concentration:  Concentration: Focuses on irrelevancies  Orientation:  Orientation: X5  Recall/memory:  Recall/Memory: Normal  Affect and Mood  Affect:  Affect: Anxious  Mood:  Mood: Euthymic  Relating  Eye contact:  Eye Contact: Avoided  Facial expression:  Facial Expression: Responsive  Attitude toward examiner:  Attitude Toward Examiner: Silly(attempted to flirt with assessing clinician despite re-direction)  Thought and Language  Speech flow: Speech Flow: Normal  Thought content:  Thought Content: Appropriate to mood and circumstances  Preoccupation:  Preoccupations: Ruminations  Hallucinations:  Hallucinations: (None)  Organization:     Transport planner of Knowledge:  Fund of Knowledge: Average  Intelligence:  Intelligence: Average  Abstraction:  Abstraction: Normal  Judgement:  Judgement: Fair  Art therapist:  Reality Testing: Adequate  Insight:  Insight: Fair  Decision Making:  Decision Making: Impulsive  Social Functioning  Social Maturity:  Social Maturity: Impulsive  Social Judgement:  Social Judgement: Normal  Stress  Stressors:  Stressors: Family conflict  Coping Ability:  Coping Ability: English as a second language teacher Deficits:     Supports:      Family and Psychosocial History: Family history Marital status: Single Are you sexually active?: Yes What is your sexual orientation?: heterosexual Has your sexual activity been affected by  drugs, alcohol, medication, or emotional stress?: no Does patient have children?: No  Childhood History:  Childhood History By whom was/is the patient raised?: Mother  Additional childhood history information: hx of father leaving; reports resentment for how father treated mother (cheating and leaving without reported reason) Description of patient's relationship with caregiver when they were a child: overall positive relationship with mother; strained relationship with father Patient's description of current relationship with people who raised him/her: mother is currently supportive; no contact with father How were you disciplined when you got in trouble as a child/adolescent?: appropriate Does patient have siblings?: Yes Description of patient's current relationship with siblings: positive Did patient suffer any verbal/emotional/physical/sexual abuse as a child?: No Did patient suffer from severe childhood neglect?: No Has patient ever been sexually abused/assaulted/raped as an adolescent or adult?: No Was the patient ever a victim of a crime or a disaster?: No Witnessed domestic violence?: No Has patient been effected by domestic violence as an adult?: No  CCA Part Two B  Employment/Work Situation: Employment / Work Copywriter, advertising Employment situation: Product manager job has been impacted by current illness: Yes Describe how patient's job has been impacted: depressed, cannot focus What is the longest time patient has a held a job?: 1 year Did You Receive Any Psychiatric Treatment/Services While in Passenger transport manager?: (N/A) Are There Guns or Other Weapons in [place]?: No  Education: Museum/gallery curator Currently Attending: none Last Grade Completed: 75 Did Teacher, adult education From [employer]?: Yes Did You Attend College?: Yes(some; did not complete) What Type of College Degree Do you Have?: N/A Did You Attend Graduate School?: No Did You Have An Individualized Education Program (IIEP):  No Did You Have Any Difficulty At School?: No  Religion:    Leisure/Recreation: Leisure / Recreation Leisure and Hobbies: being around friends, smoking weed  Exercise/Diet: Exercise/Diet Do You Exercise?: No Have You Gained or Lost A Significant Amount of Weight in the Past Six Months?: No Do You Follow a Special Diet?: No Do You Have Any Trouble Sleeping?: No  CCA Part Two C  Alcohol/Drug Use: Alcohol / Drug Use Pain Medications: none reported Prescriptions: none reported Over the Counter: none reported History of alcohol / drug use?: Yes Longest period of sobriety (when/how long): UKN; reports can stop on his own and previously has Substance #1 Name of Substance 1: cannabis 1 - Age of First Use: UKN 1 - Amount (size/oz): 1 blunt 1 - Frequency: daily 1 - Duration: ongoing 1 - Last Use / Amount: within 1 week                    CCA Part Three  ASAM's:  Six Dimensions of Multidimensional Assessment  Dimension 1:  Acute Intoxication and/or Withdrawal Potential:     Dimension 2:  Biomedical Conditions and Complications:     Dimension 3:  Emotional, Behavioral, or Cognitive Conditions and Complications:     Dimension 4:  Readiness to Change:     Dimension 5:  Relapse, Continued use, or Continued Problem Potential:     Dimension 6:  Recovery/Living Environment:      Substance use Disorder (SUD) Substance Use Disorder (SUD)  Checklist Symptoms of Substance Use: Presence of craving or strong urge to use, Social, occupational, recreational activities given up or reduced due to use  Social Function:  Social Functioning Social Maturity: Impulsive Social Judgement: Normal  Stress:  Stress Stressors: Family conflict Coping Ability: Overwhelmed Patient Takes Medications The Way The Doctor Instructed?: Yes Priority Risk: Low Acuity  Risk Assessment- Self-Harm Potential: Risk Assessment For Self-Harm Potential Thoughts of Self-Harm: No current  thoughts Additional Comments for Self-Harm Potential:  client reports hx of passive SI prior to hospitalization; went to hospital to avoid self harm  Risk Assessment -Dangerous to Others Potential: Risk Assessment For Dangerous to Others Potential Method: No Plan  DSM5 Diagnoses: Patient Active Problem List   Diagnosis Date Noted  . Superior mesenteric vein thrombosis   . Abdominal pain 04/27/2018  . 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 Hypertension 04/15/2018  . MDD (major depressive disorder), recurrent severe, without psychosis ([place]) 02/26/2018  . Non-compliance 01/14/2018  . Transaminitis 04/08/2017  . Abnormal LFTs 04/08/2017  . Drug addiction / Marajuana ([place]) 01/06/2017  . Hyperlipidemia, mixed 01/11/2016  . Palpitations 12/04/2015  . Hypothyroidism 12/04/2015  . Graves disease 08/18/2015  . Vitamin D deficiency 08/11/2015    Patient Centered Plan: Patient is on the following Treatment Plan(s):  Depression  Recommendations for Services/Supports/Treatments: Recommendations for Services/Supports/Treatments Recommendations For Services/Supports/Treatments: Individual Therapy(Outpatient, individual or group therapy to address MH and SA symptoms; continued medication management services)  Treatment Plan Summary: OP Treatment Plan Summary: (Per client "how to control everything better, so i wont get )  Referrals to Alternative Service(s): Referred to Alternative Service(s):   Place:   Date:   Time:    Referred to Alternative Service(s):   Place:   Date:   Time:    Referred to Alternative Service(s):   Place:   Date:   Time:    Referred to Alternative Service(s):   Place:   Date:   Time:     Olegario Messier, LCSW

## 2018-07-05 ENCOUNTER — Other Ambulatory Visit: Payer: Self-pay | Admitting: Gastroenterology

## 2018-07-05 ENCOUNTER — Ambulatory Visit (INDEPENDENT_AMBULATORY_CARE_PROVIDER_SITE_OTHER)
Admission: RE | Admit: 2018-07-05 | Discharge: 2018-07-05 | Disposition: A | Discharge status: Home / Self Care | Payer: 59 | Source: Ambulatory Visit | Attending: Physician Assistant | Admitting: Physician Assistant

## 2018-07-05 DIAGNOSIS — K8301 Primary sclerosing cholangitis: Secondary | ICD-10-CM | POA: Diagnosis not present

## 2018-07-05 DIAGNOSIS — R1084 Generalized abdominal pain: Secondary | ICD-10-CM | POA: Diagnosis not present

## 2018-07-05 DIAGNOSIS — IMO0002 Reserved for concepts with insufficient information to code with codable children: Secondary | ICD-10-CM

## 2018-07-05 DIAGNOSIS — I81 Portal vein thrombosis: Secondary | ICD-10-CM

## 2018-07-05 DIAGNOSIS — K55069 Acute infarction of intestine, part and extent unspecified: Secondary | ICD-10-CM

## 2018-07-05 MED ORDER — IOPAMIDOL (ISOVUE-300) INJECTION 61%
100.0000 mL | Freq: Once | INTRAVENOUS | Status: AC | PRN
  Administered 2018-07-05: 100 mL via INTRAVENOUS

## 2018-07-05 NOTE — Telephone Encounter (Signed)
Ok to refill Omeprazole 40 mg po QAM  #30/ 6 refills

## 2018-07-05 NOTE — Telephone Encounter (Signed)
Amy - Ok to refill omeprazole? If so, how much would you like to give him with refills?

## 2018-07-16 ENCOUNTER — Emergency Department (HOSPITAL_COMMUNITY)
Admission: EM | Admit: 2018-07-16 | Discharge: 2018-07-16 | Disposition: A | Discharge status: Home / Self Care | Payer: 59 | Attending: Emergency Medicine | Admitting: Emergency Medicine

## 2018-07-16 ENCOUNTER — Encounter (HOSPITAL_COMMUNITY): Payer: Self-pay | Admitting: Emergency Medicine

## 2018-07-16 ENCOUNTER — Other Ambulatory Visit: Payer: Self-pay

## 2018-07-16 DIAGNOSIS — E039 Hypothyroidism, unspecified: Secondary | ICD-10-CM | POA: Diagnosis not present

## 2018-07-16 DIAGNOSIS — Z7901 Long term (current) use of anticoagulants: Secondary | ICD-10-CM | POA: Diagnosis not present

## 2018-07-16 DIAGNOSIS — F17200 Nicotine dependence, unspecified, uncomplicated: Secondary | ICD-10-CM | POA: Diagnosis not present

## 2018-07-16 DIAGNOSIS — Z79899 Other long term (current) drug therapy: Secondary | ICD-10-CM | POA: Diagnosis not present

## 2018-07-16 DIAGNOSIS — I10 Essential (primary) hypertension: Secondary | ICD-10-CM | POA: Diagnosis not present

## 2018-07-16 DIAGNOSIS — R1013 Epigastric pain: Secondary | ICD-10-CM

## 2018-07-16 DIAGNOSIS — R112 Nausea with vomiting, unspecified: Secondary | ICD-10-CM

## 2018-07-16 LAB — COMPREHENSIVE METABOLIC PANEL
ALBUMIN: 4.9 g/dL (ref 3.5–5.0)
ALT: 77 U/L — ABNORMAL HIGH (ref 0–44)
ANION GAP: 13 (ref 5–15)
AST: 35 U/L (ref 15–41)
Alkaline Phosphatase: 122 U/L (ref 38–126)
BILIRUBIN TOTAL: 1 mg/dL (ref 0.3–1.2)
BUN: 14 mg/dL (ref 6–20)
CHLORIDE: 104 mmol/L (ref 98–111)
CO2: 24 mmol/L (ref 22–32)
Calcium: 10.1 mg/dL (ref 8.9–10.3)
Creatinine, Ser: 1.09 mg/dL (ref 0.61–1.24)
GFR calc Af Amer: >60 mL/min (ref >60)
GFR calc non Af Amer: >60 mL/min (ref >60)
GLUCOSE: 157 mg/dL — AB (ref 70–99)
POTASSIUM: 3 mmol/L — AB (ref 3.5–5.1)
SODIUM: 141 mmol/L (ref 135–145)
TOTAL PROTEIN: 8.6 g/dL — AB (ref 6.5–8.1)

## 2018-07-16 LAB — CBC
HEMATOCRIT: 40.9 % (ref 39.0–52.0)
HEMOGLOBIN: 13.7 g/dL (ref 13.0–17.0)
MCH: 29.8 pg (ref 26.0–34.0)
MCHC: 33.5 g/dL (ref 30.0–36.0)
MCV: 88.9 fL (ref 78.0–100.0)
Platelets: 128 10*3/uL — ABNORMAL LOW (ref 150–400)
RBC: 4.6 MIL/uL (ref 4.22–5.81)
RDW: 17.6 % — ABNORMAL HIGH (ref 11.5–15.5)
WBC: 9.9 10*3/uL (ref 4.0–10.5)

## 2018-07-16 LAB — RAPID URINE DRUG SCREEN, HOSP PERFORMED
Amphetamines: NOT DETECTED
BENZODIAZEPINES: NOT DETECTED
Barbiturates: NOT DETECTED
COCAINE: NOT DETECTED
Opiates: POSITIVE — AB
TETRAHYDROCANNABINOL: POSITIVE — AB

## 2018-07-16 LAB — URINALYSIS, ROUTINE W REFLEX MICROSCOPIC
BILIRUBIN URINE: NEGATIVE
Glucose, UA: NEGATIVE mg/dL
Hgb urine dipstick: NEGATIVE
Ketones, ur: NEGATIVE mg/dL
LEUKOCYTES UA: NEGATIVE
NITRITE: NEGATIVE
Protein, ur: NEGATIVE mg/dL
SPECIFIC GRAVITY, URINE: 1.016 (ref 1.005–1.030)
pH: 9 — ABNORMAL HIGH (ref 5.0–8.0)

## 2018-07-16 LAB — LIPASE, BLOOD: LIPASE: 31 U/L (ref 11–51)

## 2018-07-16 MED ORDER — ONDANSETRON HCL 4 MG/2ML IJ SOLN: 4.0000 mg | Freq: Once | INTRAMUSCULAR | Status: DC | PRN

## 2018-07-16 MED ORDER — LORAZEPAM 2 MG/ML IJ SOLN
1.0000 mg | Freq: Once | INTRAMUSCULAR | Status: AC
  Administered 2018-07-16: 1 mg via INTRAVENOUS
  Filled 2018-07-16: qty 1

## 2018-07-16 MED ORDER — POTASSIUM CHLORIDE CRYS ER 20 MEQ PO TBCR
40.0000 meq | EXTENDED_RELEASE_TABLET | Freq: Once | ORAL | Status: AC
  Administered 2018-07-16: 40 meq via ORAL
  Filled 2018-07-16: qty 2

## 2018-07-16 MED ORDER — HYDROMORPHONE HCL 1 MG/ML IJ SOLN
0.5000 mg | Freq: Once | INTRAMUSCULAR | Status: AC
  Administered 2018-07-16: 0.5 mg via INTRAVENOUS
  Filled 2018-07-16: qty 1

## 2018-07-16 MED ORDER — SODIUM CHLORIDE 0.9 % IV BOLUS
1000.0000 mL | Freq: Once | INTRAVENOUS | Status: AC
  Administered 2018-07-16: 1000 mL via INTRAVENOUS

## 2018-07-16 MED ORDER — ONDANSETRON 8 MG PO TBDP: 8.0000 mg | ORAL_TABLET | Freq: Three times a day (TID) | ORAL | 0 refills | Status: DC | PRN

## 2018-07-16 MED ORDER — HYDROMORPHONE HCL 1 MG/ML IJ SOLN
1.0000 mg | Freq: Once | INTRAMUSCULAR | Status: AC
  Administered 2018-07-16: 1 mg via INTRAVENOUS
  Filled 2018-07-16: qty 1

## 2018-07-16 MED ORDER — ONDANSETRON HCL 4 MG/2ML IJ SOLN
4.0000 mg | Freq: Once | INTRAMUSCULAR | Status: AC
  Administered 2018-07-16: 4 mg via INTRAVENOUS
  Filled 2018-07-16: qty 2

## 2018-07-16 NOTE — ED Triage Notes (Signed)
Pt c/o abdominal pain and vomiting since this morning.

## 2018-07-16 NOTE — ED Provider Notes (Signed)
[facility] DEPT Provider Note   CSN: [id number] Arrival date & time: 07/16/18  1114     History   Chief Complaint Chief Complaint  Patient presents with  . Abdominal Pain  . Emesis    HPI Kyle Gonzales is a 27 y.o. male.  Patient c/o mid abd pain and nausea/vomiting since yesterday. Symptoms acute onset, pain is moderate-severe, constant, non radiating, without specific exacerbating or alleviating factors. Emesis clear, not bloody or bilious. Had loose bm today, not bloody. No dysuria or gu c/o. No fever or chills.   The history is provided by the patient.    Past Medical History:  Diagnosis Date  . GERD (gastroesophageal reflux disease)   . Graves disease 08/18/2015  . Hyperlipidemia   . Hypothyroid   . Palpitations   . Vitamin D deficiency     Patient Active Problem List   Diagnosis Date Noted  . Superior mesenteric vein thrombosis   . Abdominal pain 04/27/2018  . 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 Hypertension 04/15/2018  . 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 Non-compliance 01/14/2018  . Transaminitis 04/08/2017  . Abnormal LFTs 04/08/2017  . Drug addiction / Marajuana ([place]) 01/06/2017  . Hyperlipidemia, mixed 01/11/2016  . Palpitations 12/04/2015  . Hypothyroidism 12/04/2015  . Graves disease 08/18/2015  . Vitamin D deficiency 08/11/2015    Past Surgical History:  Procedure Laterality Date  . ANTERIOR CRUCIATE LIGAMENT REPAIR  2012  . WISDOM TOOTH EXTRACTION          Home Medications    Prior to Admission medications   Medication Sig Start Date End Date Taking? Authorizing Provider  apixaban (ELIQUIS) 5 MG TABS tablet Take 1 tablet (5 mg total) by mouth 2 (two) times daily. 05/21/18   Brunetta Genera, MD  bisacodyl (DULCOLAX) 10 MG suppository Place 1 suppository (10 mg total) rectally every 8 (eight) hours as needed for moderate constipation. 05/02/18    Elgergawy, Silver Huguenin, MD  bisoprolol-hydrochlorothiazide (ZIAC) 10-6.25 MG tablet Take 1 tablet by mouth daily. 06/21/18   Liane Comber, NP  Cholecalciferol (VITAMIN D3) 5000 units CAPS Take 1 capsule (5,000 Units total) by mouth every other day. 05/02/18   Elgergawy, Silver Huguenin, MD  ezetimibe (ZETIA) 10 MG tablet TAKE 1 TABLET BY MOUTH ONCE DAILY FOR CHOLESTEROL 06/05/18   [name], MD  famotidine (PEPCID) 20 MG tablet Take 1 tablet (20 mg total) by mouth 2 (two) times daily. 04/10/18   Charlesetta Shanks, MD  hydrOXYzine (ATARAX/VISTARIL) 25 MG tablet Take 1 tablet (25 mg total) by mouth 3 (three) times daily as needed for anxiety. 03/01/18   Nanci Pina, FNP  levothyroxine (SYNTHROID, LEVOTHROID) 75 MCG tablet take 1 tablet by mouth every morning ON AN EMPTY STOMACH, take 1.5 tabs one day a week on Mondays. 06/06/18   Liane Comber, NP  omeprazole (PRILOSEC) 40 MG capsule Take 1 capsule (40 mg total) by mouth daily before breakfast. 07/05/18   Esterwood, Amy S, PA-C  ondansetron (ZOFRAN) 8 MG tablet Take 1 tablet 3 x/ day before meals - Nausea 04/26/18   [name], MD  oxyCODONE (OXY IR/ROXICODONE) 5 MG immediate release tablet Take 0.5 tablets (2.5 mg total) by mouth every 4 (four) hours as needed for severe pain. 05/02/18   Elgergawy, Silver Huguenin, MD  potassium chloride (K-DUR,KLOR-CON) 20 MEQ tablet Take 1 tablet (20 mEq total) by mouth daily. 05/02/18   Elgergawy, Silver Huguenin, MD  sertraline (ZOLOFT) 100  MG tablet Take 1 tablet (100 mg total) by mouth at bedtime. 04/01/18   Norman Clay, MD  sucralfate (CARAFATE) 1 g tablet Take 1 tablet (1 g total) by mouth 2 (two) times daily. 04/25/18   Antonietta Breach, PA-C  traZODone (DESYREL) 50 MG tablet Take 1 tablet (50 mg total) by mouth at bedtime as needed for sleep. 03/01/18   Nanci Pina, FNP    Family History Family History  Problem Relation Age of Onset  . Diabetes Mother   . Colon cancer Maternal Grandmother   . Stomach cancer Maternal  Grandmother   . Diabetes Maternal Grandmother   . Stomach cancer Maternal Grandfather   . Diabetes Maternal Grandfather     Social History Social History   Tobacco Use  . Smoking status: Current Some Day Smoker  . Smokeless tobacco: Never Used  . Tobacco comment: Marijuana  Substance Use Topics  . Alcohol use: No  . Drug use: Yes    Types: Marijuana     Allergies   Tylenol [acetaminophen]   Review of Systems Review of Systems  Constitutional: Negative for fever.  HENT: Negative for sore throat.   Eyes: Negative for redness.  Respiratory: Negative for shortness of breath.   Cardiovascular: Negative for chest pain.  Gastrointestinal: Positive for abdominal pain and vomiting. Negative for blood in stool.  Endocrine: Negative for polyuria.  Genitourinary: Negative for dysuria and flank pain.  Musculoskeletal: Negative for back pain.  Skin: Negative for rash.  Neurological: Negative for headaches.  Hematological: Does not bruise/bleed easily.  Psychiatric/Behavioral: Negative for confusion.     Physical Exam Updated Vital Signs BP 107/71   Pulse 74   Temp 97.6 F (36.4 C) (Axillary)   Resp 20   Ht 1.905 m (6\' 3" )   Wt 97.1 kg   SpO2 100%   BMI 26.75 kg/m   Physical Exam  Constitutional: He appears well-developed and well-nourished.  HENT:  Mouth/Throat: Oropharynx is clear and moist.  Eyes: Conjunctivae are normal. No scleral icterus.  Neck: Neck supple. No tracheal deviation present.  Cardiovascular: Normal rate, regular rhythm, normal heart sounds and intact distal pulses. Exam reveals no gallop and no friction rub.  No murmur heard. Pulmonary/Chest: Effort normal and breath sounds normal. No accessory muscle usage. No respiratory distress.  Abdominal: Soft. Bowel sounds are normal. He exhibits no distension and no mass. There is tenderness. There is no rebound and no guarding. No hernia.  Epigastric and mid abd tenderness.   Genitourinary:    Genitourinary Comments: No cva tenderness.  Musculoskeletal: He exhibits no edema.  Neurological: He is alert.  Skin: Skin is warm and dry. No rash noted.  Psychiatric: He has a normal mood and affect.  Nursing note and vitals reviewed.    ED Treatments / Results  Labs (all labs ordered are listed, but only abnormal results are displayed) Results for orders placed or performed during the hospital encounter of 07/16/18  Lipase, blood  Result Value Ref Range   Lipase 31 11 - 51 U/L  Comprehensive metabolic panel  Result Value Ref Range   Sodium 141 135 - 145 mmol/L   Potassium 3.0 (L) 3.5 - 5.1 mmol/L   Chloride 104 98 - 111 mmol/L   CO2 24 22 - 32 mmol/L   Glucose, Bld 157 (H) 70 - 99 mg/dL   BUN 14 6 - 20 mg/dL   Creatinine, Ser 1.09 0.61 - 1.24 mg/dL   Calcium 10.1 8.9 - 10.3 mg/dL  Total Protein 8.6 (H) 6.5 - 8.1 g/dL   Albumin 4.9 3.5 - 5.0 g/dL   AST 35 15 - 41 U/L   ALT 77 (H) 0 - 44 U/L   Alkaline Phosphatase 122 38 - 126 U/L   Total Bilirubin 1.0 0.3 - 1.2 mg/dL   GFR calc non Af Amer >60 >60 mL/min   GFR calc Af Amer >60 >60 mL/min   Anion gap 13 5 - 15  CBC  Result Value Ref Range   WBC 9.9 4.0 - 10.5 K/uL   RBC 4.60 4.22 - 5.81 MIL/uL   Hemoglobin 13.7 13.0 - 17.0 g/dL   HCT 40.9 39.0 - 52.0 %   MCV 88.9 78.0 - 100.0 fL   MCH 29.8 26.0 - 34.0 pg   MCHC 33.5 30.0 - 36.0 g/dL   RDW 17.6 (H) 11.5 - 15.5 %   Platelets 128 (L) 150 - 400 K/uL  Urinalysis, Routine w reflex microscopic  Result Value Ref Range   Color, Urine YELLOW YELLOW   APPearance CLEAR CLEAR   Specific Gravity, Urine 1.016 1.005 - 1.030   pH 9.0 (H) 5.0 - 8.0   Glucose, UA NEGATIVE NEGATIVE mg/dL   Hgb urine dipstick NEGATIVE NEGATIVE   Bilirubin Urine NEGATIVE NEGATIVE   Ketones, ur NEGATIVE NEGATIVE mg/dL   Protein, ur NEGATIVE NEGATIVE mg/dL   Nitrite NEGATIVE NEGATIVE   Leukocytes, UA NEGATIVE NEGATIVE  Rapid urine drug screen (hospital performed)  Result Value Ref Range    Opiates POSITIVE (A) NONE DETECTED   Cocaine NONE DETECTED NONE DETECTED   Benzodiazepines NONE DETECTED NONE DETECTED   Amphetamines NONE DETECTED NONE DETECTED   Tetrahydrocannabinol POSITIVE (A) NONE DETECTED   Barbiturates NONE DETECTED NONE DETECTED   Ct Abdomen Pelvis W Contrast  Result Date: 07/05/2018 CLINICAL DATA:  Primary sclerosing cholangitis.  SMV thrombosis. EXAM: CT ABDOMEN AND PELVIS WITH CONTRAST TECHNIQUE: Multidetector CT imaging of the abdomen and pelvis was performed using the standard protocol following bolus administration of intravenous contrast. CONTRAST:  190mL ISOVUE-300 IOPAMIDOL (ISOVUE-300) INJECTION 61% COMPARISON:  04/29/2018 FINDINGS: Lower chest: Unremarkable. Hepatobiliary: Patient became nauseated during scanning through the abdomen resulting and motion artifact. Within this limitation, no new abnormality is seen in the liver. The posterior subcapsular low-density lesion measuring 2.6 cm on the previous exam is similar today measuring 2.7 cm. 14 mm low-density lesion in the lateral right liver is somewhat obscured by motion artifact but appear similar (21/2). Gallbladder is distended with layering sludge and/or tiny stones. Stable appearance mild intrahepatic biliary duct dilatation. Pancreas: No focal mass lesion. No dilatation of the main duct. No intraparenchymal cyst. No peripancreatic edema. Spleen: No splenomegaly. No focal mass lesion. Adrenals/Urinary Tract: No adrenal nodule or mass. Kidneys unremarkable. No evidence for hydroureter. The urinary bladder appears normal for the degree of distention. Stomach/Bowel: Stomach is nondistended. No gastric wall thickening. No evidence of outlet obstruction. Duodenum is normally positioned as is the ligament of Treitz. No small bowel wall thickening. No small bowel dilatation. The terminal ileum is normal. The appendix is not well seen today but at the base appears to have wall thickening. Mild circumferential wall  thickening is identified in the cecum and ascending colon. Remaining colonic segments are unremarkable. Vascular/Lymphatic: No abdominal aortic aneurysm. 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 SMV not well evaluated due to motion artifact. Celiac axis is obscured but appears patent. SMA is patent. No abdominal aortic atherosclerotic calcification. There is no gastrohepatic or hepatoduodenal ligament  lymphadenopathy. No intraperitoneal or retroperitoneal lymphadenopathy. No pelvic sidewall lymphadenopathy. Reproductive: The prostate gland and seminal vesicles have normal imaging features. Other: Edema is identified in the pelvic floor and in the pelvic mesentery. No substantial intraperitoneal free fluid. Musculoskeletal: No worrisome lytic or sclerotic osseous abnormality. IMPRESSION: 1. Images through the lower abdomen and upper pelvis are motion degraded. No substantial change within this limitation. 2. SMV not well demonstrated on the current study with evidence of cavernous transformation of the portal vein compatible with previous reports of portal vein occlusion. 3. Mild circumferential wall thickening is identified in the cecum, ascending colon, and base of appendix with subtle pericolonic edema/inflammation. This is associated with edema in the small bowel mesentery of the pelvis. No definite bowel wall thickening evident on the current exam. 4. Stable hypoattenuating lesions within the liver parenchyma. Electronically Signed   By: Misty Stanley M.D.   On: 07/05/2018 16:19    EKG None  Radiology No results found.  Procedures Procedures (including critical care time)  Medications Ordered in ED Medications  ondansetron (ZOFRAN) injection 4 mg (has no administration in time range)  sodium chloride 0.9 % bolus 1,000 mL (has no administration in time range)  HYDROmorphone (DILAUDID) injection 1 mg (has no administration in time range)  ondansetron (ZOFRAN)  injection 4 mg (has no administration in time range)     Initial Impression / Assessment and Plan / ED Course  I have reviewed the triage vital signs and the nursing notes.  Pertinent labs & imaging results that were available during my care of the patient were reviewed by me and considered in my medical decision making (see chart for details).  Iv ns bolus. Dilaudid 1 mg iv. zofran iv. Labs.   Reviewed nursing notes and prior charts for additional history. Pt with recent imaging of abd/ct - appears largely unchanged from prior.   Additional ivf.   Trial po fluids - no recurrent vomiting.   Recheck abd - soft non tender. Afebrile. Pt feels improved.   Labs reviewed - k low. kcl po.   rec pcp f/u 1-2 days for recheck. Return precautions provided.     Final Clinical Impressions(s) / ED Diagnoses   Final diagnoses:  None    ED Discharge Orders    None       Lajean Saver, MD 07/16/18 1600

## 2018-07-16 NOTE — ED Notes (Signed)
Pt Iis c/o lower abdominal pain  With associated n/v that started this am. Pt has hx of reflux. Pt mother escorted pt and states that pt was reported that he has recently been dx with blood clot in abdomen, CT scan was performed, but per pt mother has not gotten results. Pt is on blood thinners.

## 2018-07-16 NOTE — Discharge Instructions (Signed)
It was our pleasure to provide your ER care today - we hope that you feel better.  Rest. Drink plenty of fluids. Take zofran as need for nausea.  From today's labs, your potassium level is mildly low (3) - eat plenty of fruits and vegetables, and follow up with primary care doctor for recheck.   Avoid marijuana use as it can cause a recurrent vomiting and abdominal pain syndrome.   Follow up with primary care doctor in the next 1-2 days for recheck.  Return to ER right away if worse, new symptoms, fevers, persistent vomiting, severe abdominal pain, other concern.   You were given pain medication in the ER - no driving for the next 6 hours.

## 2018-07-23 ENCOUNTER — Encounter (HOSPITAL_COMMUNITY): Payer: Self-pay | Admitting: *Deleted

## 2018-07-23 ENCOUNTER — Other Ambulatory Visit: Payer: Self-pay

## 2018-07-23 ENCOUNTER — Emergency Department (HOSPITAL_COMMUNITY)
Admission: EM | Admit: 2018-07-23 | Discharge: 2018-07-23 | Disposition: A | Discharge status: Home / Self Care | Payer: 59 | Attending: Emergency Medicine | Admitting: Emergency Medicine

## 2018-07-23 DIAGNOSIS — E785 Hyperlipidemia, unspecified: Secondary | ICD-10-CM | POA: Diagnosis not present

## 2018-07-23 DIAGNOSIS — Z79899 Other long term (current) drug therapy: Secondary | ICD-10-CM | POA: Diagnosis not present

## 2018-07-23 DIAGNOSIS — K2950 Unspecified chronic gastritis without bleeding: Secondary | ICD-10-CM | POA: Insufficient documentation

## 2018-07-23 DIAGNOSIS — R109 Unspecified abdominal pain: Secondary | ICD-10-CM | POA: Diagnosis present

## 2018-07-23 HISTORY — DX: Primary sclerosing cholangitis: K83.01

## 2018-07-23 MED ORDER — ALUM & MAG HYDROXIDE-SIMETH 400-400-40 MG/5ML PO SUSP: 15.0000 mL | Freq: Four times a day (QID) | ORAL | 0 refills | Status: DC | PRN

## 2018-07-23 MED ORDER — ONDANSETRON 4 MG PO TBDP
4.0000 mg | ORAL_TABLET | Freq: Once | ORAL | Status: AC
  Administered 2018-07-23: 4 mg via ORAL
  Filled 2018-07-23: qty 1

## 2018-07-23 MED ORDER — LIDOCAINE VISCOUS HCL 2 % MT SOLN: 15.0000 mL | Freq: Four times a day (QID) | OROMUCOSAL | 0 refills | Status: DC | PRN

## 2018-07-23 MED ORDER — GI COCKTAIL ~~LOC~~
30.0000 mL | Freq: Once | ORAL | Status: AC
  Administered 2018-07-23: 30 mL via ORAL
  Filled 2018-07-23: qty 30

## 2018-07-23 NOTE — Discharge Instructions (Signed)

## 2018-07-23 NOTE — ED Triage Notes (Signed)
Pt presents with onset of "sharp, burning" abdominal pain that started last night before bed. He took pepto bismol for the discomfort at that time. Pt woke up with the same but worse pain this morning. States it feels like it is his acid reflux.

## 2018-07-23 NOTE — ED Provider Notes (Signed)
[facility] DEPT Provider Note  CSN: [id number] Arrival date & time: 07/23/18 0502  Chief Complaint(s) Abdominal Pain  HPI Kyle Gonzales is a 27 y.o. 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  States that the pain began 1-1/2 hours prior to arrival.  Reports eating fried chicken the night before and taking Pepto-Bismol.  Since pain started patient has not tried taking any medicine.  He denies any alleviating or aggravating factors.  Endorses associated nausea without emesis.  No diarrhea.  No recent fevers or chills.  No other suspicious food intake.  No sick contact.  HPI  Past Medical History Past Medical History:  Diagnosis Date  . GERD (gastroesophageal reflux disease)   . Graves disease 08/18/2015  . Hyperlipidemia   . Hypothyroid   . Palpitations   . PSC (primary sclerosing cholangitis)   . Vitamin D deficiency    Patient Active Problem List   Diagnosis Date Noted  . Superior mesenteric vein thrombosis   . Abdominal pain 04/27/2018  . 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 Hypertension 04/15/2018  . 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 Non-compliance 01/14/2018  . Transaminitis 04/08/2017  . Abnormal LFTs 04/08/2017  . Drug addiction / Marajuana ([place]) 01/06/2017  . Hyperlipidemia, mixed 01/11/2016  . Palpitations 12/04/2015  . Hypothyroidism 12/04/2015  . Graves disease 08/18/2015  . Vitamin D deficiency 08/11/2015   Home Medication(s) Prior to Admission medications   Medication Sig Start Date End Date Taking? Authorizing Provider  alum & mag hydroxide-simeth (MAALOX ADVANCED MAX ST) 400-400-40 MG/5ML suspension Take 15 mLs by mouth every 6 (six) hours as needed for indigestion. 07/23/18   Luis Nickles, Grayce Sessions, MD  apixaban (ELIQUIS) 5 MG TABS tablet Take 1 tablet (5 mg total) by mouth 2 (two) times daily. 05/21/18   Brunetta Genera, MD  bisacodyl (DULCOLAX) 10 MG suppository Place 1 suppository (10 mg total) rectally every 8 (eight) hours as needed for moderate constipation. 05/02/18   Elgergawy, Silver Huguenin, MD  bisoprolol-hydrochlorothiazide (ZIAC) 10-6.25 MG tablet Take 1 tablet by mouth daily. 06/21/18   Liane Comber, NP  Cholecalciferol (VITAMIN D3) 5000 units CAPS Take 1 capsule (5,000 Units total) by mouth every other day. 05/02/18   Elgergawy, Silver Huguenin, MD  ezetimibe (ZETIA) 10 MG tablet TAKE 1 TABLET BY MOUTH ONCE DAILY FOR CHOLESTEROL Patient taking differently: Take 10 mg by mouth daily.  06/05/18   [name], MD  famotidine (PEPCID) 20 MG tablet Take 1 tablet (20 mg total) by mouth 2 (two) times daily. Patient not taking: Reported on 07/16/2018 04/10/18   Charlesetta Shanks, MD  hydrOXYzine (ATARAX/VISTARIL) 25 MG tablet Take 1 tablet (25 mg total) by mouth 3 (three) times daily as needed for anxiety. 03/01/18   Starkes-Perry, Gayland Curry, FNP  levothyroxine (SYNTHROID, LEVOTHROID) 75 MCG tablet take 1 tablet by mouth every morning ON AN EMPTY STOMACH, take 1.5 tabs one day a week on Mondays. Patient taking differently: Take 75 mcg by mouth daily.  06/06/18   Liane Comber, NP  lidocaine (XYLOCAINE) 2 % solution Use as directed 15 mLs in the mouth or throat every 6 (six) hours as needed for mouth pain. 07/23/18   Fatima Blank, MD  omeprazole (PRILOSEC) 40 MG capsule Take 1 capsule (40 mg total) by mouth daily before breakfast.  Patient taking differently: Take 40 mg by mouth daily as needed (heartburn).  07/05/18   Esterwood, Amy S, PA-C  ondansetron (ZOFRAN ODT) 8 MG disintegrating tablet Take 1 tablet (8 mg total) by mouth every 8 (eight) hours as needed for nausea or vomiting. 07/16/18   Lajean Saver, MD  ondansetron (ZOFRAN) 8 MG tablet Take 1 tablet 3 x/ day before meals -  Nausea Patient taking differently: Take 8 mg by mouth 3 (three) times daily as needed for nausea or vomiting.  04/26/18   [name], MD  oxyCODONE (OXY IR/ROXICODONE) 5 MG immediate release tablet Take 0.5 tablets (2.5 mg total) by mouth every 4 (four) hours as needed for severe pain. Patient not taking: Reported on 07/16/2018 05/02/18   Elgergawy, Silver Huguenin, MD  potassium chloride (K-DUR,KLOR-CON) 20 MEQ tablet Take 1 tablet (20 mEq total) by mouth daily. 05/02/18   Elgergawy, Silver Huguenin, MD  sertraline (ZOLOFT) 100 MG tablet Take 1 tablet (100 mg total) by mouth at bedtime. 04/01/18   Norman Clay, MD  sucralfate (CARAFATE) 1 g tablet Take 1 tablet (1 g total) by mouth 2 (two) times daily. 04/25/18   Antonietta Breach, PA-C  sucralfate (CARAFATE) 1 GM/10ML suspension Take 1 g by mouth daily as needed (stomach pain).    [provider]  traZODone (DESYREL) 50 MG tablet Take 1 tablet (50 mg total) by mouth at bedtime as needed for sleep. Patient not taking: Reported on 07/16/2018 03/01/18   Suella Broad, FNP                                                                                                                                    Past Surgical History Past Surgical History:  Procedure Laterality Date  . ANTERIOR CRUCIATE LIGAMENT REPAIR  2012  . WISDOM TOOTH EXTRACTION     Family History Family History  Problem Relation Age of Onset  . Diabetes Mother   . Colon cancer Maternal Grandmother   . Stomach cancer Maternal Grandmother   . Diabetes Maternal Grandmother   . Stomach cancer Maternal Grandfather   . Diabetes Maternal Grandfather     Social History Social History   Tobacco Use  . Smoking status: Current Some Day Smoker  . Smokeless tobacco: Never Used  . Tobacco comment: Marijuana  Substance Use Topics  . Alcohol use: No  . Drug use: Yes    Types: Marijuana   Allergies Tylenol [acetaminophen]  Review of Systems Review of Systems All other systems are  reviewed and are negative for acute change except as noted in the HPI  Physical Exam Vital Signs  I have reviewed the triage vital signs BP (!) 141/80 (BP Location: Left Arm)   Pulse 83   Temp 98.2 F (36.8 C) (Oral)   Resp (!) 22   SpO2 100%   Physical Exam  Constitutional: He is oriented to person, place, and time. He appears well-developed  and well-nourished. No distress.  HENT:  Head: Normocephalic and atraumatic.  Right Ear: External ear normal.  Left Ear: External ear normal.  Nose: Nose normal.  Mouth/Throat: Mucous membranes are normal. No trismus in the jaw.  Eyes: Conjunctivae and EOM are normal. No scleral icterus.  Neck: Normal range of motion and phonation normal.  Cardiovascular: Normal rate and regular rhythm.  Pulmonary/Chest: Effort normal. No stridor. No respiratory distress.  Abdominal: He exhibits no distension. There is no tenderness. There is no rigidity, no rebound and no guarding.  Musculoskeletal: Normal range of motion. He exhibits no edema.  Neurological: He is alert and oriented to person, place, and time.  Skin: He is not diaphoretic.  Psychiatric: He has a normal mood and affect. His behavior is normal.  Vitals reviewed.   ED Results and Treatments Labs (all labs ordered are listed, but only abnormal results are displayed) Labs Reviewed - No data to display                                                                                                                       EKG  EKG Interpretation  Date/Time:    Ventricular Rate:    PR Interval:    QRS Duration:   QT Interval:    QTC Calculation:   R Axis:     Text Interpretation:        Radiology No results found. Pertinent labs & imaging results that were available during my care of the patient were reviewed by me and considered in my medical decision making (see chart for details).  Medications Ordered in ED Medications  ondansetron (ZOFRAN-ODT) disintegrating tablet 4 mg (4 mg  Oral Given 07/23/18 0603)  gi cocktail (Maalox,Lidocaine,Donnatal) (30 mLs Oral Given 07/23/18 0603)                                                                                                                                    Procedures Procedures  (including critical care time)  Medical Decision Making / ED Course I have reviewed the nursing notes for this encounter and the patient's prior records (if available in EHR or on provided paperwork).    Reports typical for his gastritis. Not similar to SMV thrombosis pain. Benign abdomen.   Discussed providing symptomatic management first and he has significant improvement, hold off on labs and imaging.  Patient given ODT Zofran and GI cocktail resulting in  significant improvement in his symptoms.  Able to tolerate oral intake.  The patient is safe for discharge with strict return precautions.   Final Clinical Impression(s) / ED Diagnoses Final diagnoses:  Other chronic gastritis, presence of bleeding unspecified     Disposition: Discharge  Condition: Good  I have discussed the results, Dx and Tx plan with the patient and mother who expressed understanding and agree(s) with the plan. Discharge instructions discussed at great length. The patient and mother were given strict return precautions who verbalized understanding of the instructions. No further questions at time of discharge.    ED Discharge Orders         Ordered    lidocaine (XYLOCAINE) 2 % solution  Every 6 hours PRN     07/23/18 0707    alum & mag hydroxide-simeth (MAALOX ADVANCED MAX ST) 117-356-70 MG/5ML suspension  Every 6 hours PRN     07/23/18 1410           Follow Up: [name], MD [address] [place] [address] [phone]  Schedule an appointment as soon as possible for a visit  As needed    This chart was dictated using voice recognition software.  Despite best efforts to proofread,  errors can occur which can  change the documentation meaning.   Fatima Blank, MD 07/23/18 [phone]

## 2018-08-01 ENCOUNTER — Telehealth (HOSPITAL_COMMUNITY): Payer: Self-pay | Admitting: Psychiatry

## 2018-08-01 MED ORDER — SERTRALINE HCL 100 MG PO TABS: 100.0000 mg | ORAL_TABLET | Freq: Every day | ORAL | 0 refills | Status: DC

## 2018-08-01 NOTE — Telephone Encounter (Signed)
Ordered sertraline refill per request. Please advise the patient to make follow up appointment. I will not be able to do any more refill without evaluation.

## 2018-08-01 NOTE — Telephone Encounter (Signed)
LVM per Provider: Ordered sertraline refill per request. Please advise the patient to make follow up appointment. I will not be able to do any more refill without evaluation.

## 2018-08-07 ENCOUNTER — Ambulatory Visit (HOSPITAL_COMMUNITY): Payer: Self-pay | Admitting: Licensed Clinical Social Worker

## 2018-08-12 ENCOUNTER — Telehealth: Payer: Self-pay | Admitting: Hematology

## 2018-08-12 NOTE — Telephone Encounter (Signed)
Patient called to reschedule  °

## 2018-08-16 ENCOUNTER — Inpatient Hospital Stay: Payer: 59 | Attending: Hematology

## 2018-08-16 ENCOUNTER — Encounter (HOSPITAL_COMMUNITY): Payer: Self-pay | Admitting: Emergency Medicine

## 2018-08-16 ENCOUNTER — Inpatient Hospital Stay (HOSPITAL_COMMUNITY): Payer: 59

## 2018-08-16 ENCOUNTER — Inpatient Hospital Stay (HOSPITAL_COMMUNITY)
Admission: EM | Admit: 2018-08-16 | Discharge: 2018-08-21 | DRG: 444 | Disposition: A | Discharge status: Home / Self Care | Payer: 59 | Attending: Internal Medicine | Admitting: Internal Medicine

## 2018-08-16 ENCOUNTER — Telehealth: Payer: Self-pay | Admitting: *Deleted

## 2018-08-16 ENCOUNTER — Other Ambulatory Visit: Payer: Self-pay

## 2018-08-16 DIAGNOSIS — I81 Portal vein thrombosis: Secondary | ICD-10-CM | POA: Insufficient documentation

## 2018-08-16 DIAGNOSIS — K55069 Acute infarction of intestine, part and extent unspecified: Secondary | ICD-10-CM | POA: Diagnosis present

## 2018-08-16 DIAGNOSIS — F17290 Nicotine dependence, other tobacco product, uncomplicated: Secondary | ICD-10-CM | POA: Diagnosis present

## 2018-08-16 DIAGNOSIS — K8021 Calculus of gallbladder without cholecystitis with obstruction: Secondary | ICD-10-CM | POA: Diagnosis present

## 2018-08-16 DIAGNOSIS — R748 Abnormal levels of other serum enzymes: Secondary | ICD-10-CM | POA: Diagnosis not present

## 2018-08-16 DIAGNOSIS — E559 Vitamin D deficiency, unspecified: Secondary | ICD-10-CM | POA: Diagnosis present

## 2018-08-16 DIAGNOSIS — K8301 Primary sclerosing cholangitis: Secondary | ICD-10-CM | POA: Diagnosis present

## 2018-08-16 DIAGNOSIS — Z720 Tobacco use: Secondary | ICD-10-CM | POA: Insufficient documentation

## 2018-08-16 DIAGNOSIS — K219 Gastro-esophageal reflux disease without esophagitis: Secondary | ICD-10-CM | POA: Diagnosis present

## 2018-08-16 DIAGNOSIS — R760 Raised antibody titer: Secondary | ICD-10-CM | POA: Insufficient documentation

## 2018-08-16 DIAGNOSIS — E782 Mixed hyperlipidemia: Secondary | ICD-10-CM | POA: Diagnosis present

## 2018-08-16 DIAGNOSIS — E039 Hypothyroidism, unspecified: Secondary | ICD-10-CM | POA: Diagnosis present

## 2018-08-16 DIAGNOSIS — F339 Major depressive disorder, recurrent, unspecified: Secondary | ICD-10-CM | POA: Diagnosis present

## 2018-08-16 DIAGNOSIS — F1290 Cannabis use, unspecified, uncomplicated: Secondary | ICD-10-CM | POA: Diagnosis present

## 2018-08-16 DIAGNOSIS — Z79890 Hormone replacement therapy (postmenopausal): Secondary | ICD-10-CM

## 2018-08-16 DIAGNOSIS — IMO0002 Reserved for concepts with insufficient information to code with codable children: Secondary | ICD-10-CM

## 2018-08-16 DIAGNOSIS — E890 Postprocedural hypothyroidism: Secondary | ICD-10-CM | POA: Diagnosis present

## 2018-08-16 DIAGNOSIS — K7200 Acute and subacute hepatic failure without coma: Secondary | ICD-10-CM | POA: Diagnosis present

## 2018-08-16 DIAGNOSIS — K8050 Calculus of bile duct without cholangitis or cholecystitis without obstruction: Principal | ICD-10-CM | POA: Diagnosis present

## 2018-08-16 DIAGNOSIS — Z888 Allergy status to other drugs, medicaments and biological substances status: Secondary | ICD-10-CM | POA: Diagnosis not present

## 2018-08-16 DIAGNOSIS — R933 Abnormal findings on diagnostic imaging of other parts of digestive tract: Secondary | ICD-10-CM | POA: Diagnosis not present

## 2018-08-16 DIAGNOSIS — Z79899 Other long term (current) drug therapy: Secondary | ICD-10-CM

## 2018-08-16 DIAGNOSIS — K831 Obstruction of bile duct: Secondary | ICD-10-CM

## 2018-08-16 DIAGNOSIS — I10 Essential (primary) hypertension: Secondary | ICD-10-CM | POA: Diagnosis not present

## 2018-08-16 DIAGNOSIS — F419 Anxiety disorder, unspecified: Secondary | ICD-10-CM | POA: Diagnosis present

## 2018-08-16 DIAGNOSIS — Z7901 Long term (current) use of anticoagulants: Secondary | ICD-10-CM

## 2018-08-16 DIAGNOSIS — I82409 Acute embolism and thrombosis of unspecified deep veins of unspecified lower extremity: Secondary | ICD-10-CM | POA: Diagnosis present

## 2018-08-16 DIAGNOSIS — B379 Candidiasis, unspecified: Secondary | ICD-10-CM | POA: Diagnosis present

## 2018-08-16 DIAGNOSIS — Z800 Family history of malignant neoplasm of digestive organs: Secondary | ICD-10-CM | POA: Insufficient documentation

## 2018-08-16 DIAGNOSIS — F332 Major depressive disorder, recurrent severe without psychotic features: Secondary | ICD-10-CM | POA: Diagnosis present

## 2018-08-16 DIAGNOSIS — E0500 Thyrotoxicosis with diffuse goiter without thyrotoxic crisis or storm: Secondary | ICD-10-CM | POA: Diagnosis present

## 2018-08-16 DIAGNOSIS — R52 Pain, unspecified: Secondary | ICD-10-CM

## 2018-08-16 LAB — CMP (CANCER CENTER ONLY)
ALK PHOS: 750 U/L — AB (ref 38–126)
ALT: 543 U/L — AB (ref 0–44)
AST: 303 U/L — AB (ref 15–41)
Albumin: 3.7 g/dL (ref 3.5–5.0)
Anion gap: 10 (ref 5–15)
BILIRUBIN TOTAL: 10.3 mg/dL — AB (ref 0.3–1.2)
BUN: 9 mg/dL (ref 6–20)
CO2: 27 mmol/L (ref 22–32)
Calcium: 10.1 mg/dL (ref 8.9–10.3)
Chloride: 101 mmol/L (ref 98–111)
Creatinine: 1.14 mg/dL (ref 0.61–1.24)
GFR, Est AFR Am: >60 mL/min (ref >60)
GFR, Estimated: >60 mL/min (ref >60)
GLUCOSE: 103 mg/dL — AB (ref 70–99)
POTASSIUM: 4.4 mmol/L (ref 3.5–5.1)
Sodium: 138 mmol/L (ref 135–145)
Total Protein: 8.3 g/dL — ABNORMAL HIGH (ref 6.5–8.1)

## 2018-08-16 LAB — CBC WITH DIFFERENTIAL/PLATELET
ABS IMMATURE GRANULOCYTES: 0.01 10*3/uL (ref 0.00–0.07)
Basophils Absolute: 0 10*3/uL (ref 0.0–0.1)
Basophils Relative: 0 %
Eosinophils Absolute: 0 10*3/uL (ref 0.0–0.5)
Eosinophils Relative: 1 %
HEMATOCRIT: 37.9 % — AB (ref 39.0–52.0)
HEMOGLOBIN: 12.5 g/dL — AB (ref 13.0–17.0)
IMMATURE GRANULOCYTES: 0 %
LYMPHS ABS: 0.9 10*3/uL (ref 0.7–4.0)
LYMPHS PCT: 21 %
MCH: 29.2 pg (ref 26.0–34.0)
MCHC: 33 g/dL (ref 30.0–36.0)
MCV: 88.6 fL (ref 80.0–100.0)
MONOS PCT: 11 %
Monocytes Absolute: 0.5 10*3/uL (ref 0.1–1.0)
NEUTROS ABS: 2.8 10*3/uL (ref 1.7–7.7)
NEUTROS PCT: 67 %
PLATELETS: 157 10*3/uL (ref 150–400)
RBC: 4.28 MIL/uL (ref 4.22–5.81)
RDW: 19.1 % — ABNORMAL HIGH (ref 11.5–15.5)
WBC: 4.1 10*3/uL (ref 4.0–10.5)
nRBC: 0 % (ref 0.0–0.2)

## 2018-08-16 LAB — APTT: aPTT: 29 seconds (ref 24–36)

## 2018-08-16 LAB — PROTIME-INR
INR: 0.88
Prothrombin Time: 11.9 seconds (ref 11.4–15.2)

## 2018-08-16 MED ORDER — ONDANSETRON HCL 4 MG PO TABS: 4.0000 mg | ORAL_TABLET | Freq: Four times a day (QID) | ORAL | Status: DC | PRN

## 2018-08-16 MED ORDER — BISOPROLOL-HYDROCHLOROTHIAZIDE 10-6.25 MG PO TABS
1.0000 | ORAL_TABLET | Freq: Every day | ORAL | Status: DC
  Administered 2018-08-16 – 2018-08-21 (×6): 1 via ORAL
  Filled 2018-08-16 (×6): qty 1

## 2018-08-16 MED ORDER — PANTOPRAZOLE SODIUM 40 MG PO TBEC
40.0000 mg | DELAYED_RELEASE_TABLET | Freq: Every day | ORAL | Status: DC
  Administered 2018-08-17 – 2018-08-21 (×5): 40 mg via ORAL
  Filled 2018-08-16 (×5): qty 1

## 2018-08-16 MED ORDER — IBUPROFEN 200 MG PO TABS
400.0000 mg | ORAL_TABLET | Freq: Four times a day (QID) | ORAL | Status: DC | PRN
  Administered 2018-08-19: 400 mg via ORAL
  Filled 2018-08-16 (×2): qty 2

## 2018-08-16 MED ORDER — ONDANSETRON HCL 4 MG/2ML IJ SOLN
4.0000 mg | Freq: Four times a day (QID) | INTRAMUSCULAR | Status: DC | PRN
  Administered 2018-08-16 – 2018-08-19 (×3): 4 mg via INTRAVENOUS
  Filled 2018-08-16 (×3): qty 2

## 2018-08-16 MED ORDER — ALUM & MAG HYDROXIDE-SIMETH 200-200-20 MG/5ML PO SUSP
15.0000 mL | Freq: Four times a day (QID) | ORAL | Status: DC | PRN
  Administered 2018-08-18 – 2018-08-20 (×4): 15 mL via ORAL
  Filled 2018-08-16 (×4): qty 30

## 2018-08-16 MED ORDER — APIXABAN 5 MG PO TABS
5.0000 mg | ORAL_TABLET | Freq: Two times a day (BID) | ORAL | Status: DC
  Administered 2018-08-17: 5 mg via ORAL
  Filled 2018-08-16 (×2): qty 1

## 2018-08-16 MED ORDER — VITAMIN D3 25 MCG (1000 UNIT) PO TABS
5000.0000 [IU] | ORAL_TABLET | ORAL | Status: DC
  Administered 2018-08-17 – 2018-08-21 (×2): 5000 [IU] via ORAL
  Filled 2018-08-16 (×2): qty 5

## 2018-08-16 MED ORDER — GADOBUTROL 1 MMOL/ML IV SOLN
10.0000 mL | Freq: Once | INTRAVENOUS | Status: AC | PRN
  Administered 2018-08-16: 10 mL via INTRAVENOUS

## 2018-08-16 MED ORDER — BISOPROLOL-HYDROCHLOROTHIAZIDE 10-6.25 MG PO TABS: 1.0000 | ORAL_TABLET | Freq: Every day | ORAL | Status: DC

## 2018-08-16 MED ORDER — LEVOTHYROXINE SODIUM 75 MCG PO TABS
75.0000 ug | ORAL_TABLET | Freq: Every day | ORAL | Status: DC
  Administered 2018-08-17 – 2018-08-21 (×4): 75 ug via ORAL
  Filled 2018-08-16 (×6): qty 1

## 2018-08-16 MED ORDER — HYDROXYZINE HCL 25 MG PO TABS
25.0000 mg | ORAL_TABLET | Freq: Three times a day (TID) | ORAL | Status: DC | PRN
  Administered 2018-08-20: 25 mg via ORAL
  Filled 2018-08-16: qty 1

## 2018-08-16 NOTE — Telephone Encounter (Signed)
Contacted by Lab with panic values for liver enzymes. Notified Dr. Irene Limbo of results: Bilirubin 10.3; AST 303; ALT 543. Per Dr. Irene Limbo - contact patient and instruct to go to ED for evaluation of liver function. Spoke with patient and mother. Instructions given. Both verbalized understanding and said they would go to [facility] ED now.  Contacted [facility] ED. Told charge RN Abigail Butts that patient instructed to go to ED per Dr. Irene Limbo.

## 2018-08-16 NOTE — ED Provider Notes (Signed)
[facility] DEPT Provider Note   CSN: [id number] Arrival date & time: 08/16/18  1321     History   Chief Complaint Chief Complaint  Patient presents with  . Abnormal Lab  . Abdominal Pain    HPI Kyle Gonzales is a 27 y.o. male.  HPI  27 year old male with history of primary sclerosing cholangitis, SMV thrombosis comes in with chief complaint of abnormal labs and abdominal pain.  Patient reports that he has had persistent abdominal pain for the last several days, which is right-sided.  He denies any associated nausea, vomiting or diarrhea.  Patient went for routine outpatient blood work, and he was called today and advised to come to the ER because of liver failure.  Patient denies any recent illnesses.  Past Medical History:  Diagnosis Date  . GERD (gastroesophageal reflux disease)   . Graves disease 08/18/2015  . Hyperlipidemia   . Hypothyroid   . Palpitations   . PSC (primary sclerosing cholangitis)   . Vitamin D deficiency     Patient Active Problem List   Diagnosis Date Noted  . Superior mesenteric vein thrombosis   . Abdominal pain 04/27/2018  . 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 Hypertension 04/15/2018  . MDD (major depressive disorder), recurrent severe, without psychosis ([place]) 02/26/2018  . Non-compliance 01/14/2018  . Transaminitis 04/08/2017  . Abnormal LFTs 04/08/2017  . Drug addiction / Marajuana ([place]) 01/06/2017  . Hyperlipidemia, mixed 01/11/2016  . Palpitations 12/04/2015  . Hypothyroidism 12/04/2015  . Graves disease 08/18/2015  . Vitamin D deficiency 08/11/2015    Past Surgical History:  Procedure Laterality Date  . ANTERIOR CRUCIATE LIGAMENT REPAIR  2012  . WISDOM TOOTH EXTRACTION          Home Medications    Prior to Admission medications   Medication Sig Start Date End Date Taking? Authorizing Provider  alum & mag hydroxide-simeth (MAALOX ADVANCED MAX ST) 400-400-40 MG/5ML suspension Take  15 mLs by mouth every 6 (six) hours as needed for indigestion. 07/23/18   Cardama, Grayce Sessions, MD  apixaban (ELIQUIS) 5 MG TABS tablet Take 1 tablet (5 mg total) by mouth 2 (two) times daily. 05/21/18   Brunetta Genera, MD  bisacodyl (DULCOLAX) 10 MG suppository Place 1 suppository (10 mg total) rectally every 8 (eight) hours as needed for moderate constipation. 05/02/18   Elgergawy, Silver Huguenin, MD  bisoprolol-hydrochlorothiazide (ZIAC) 10-6.25 MG tablet Take 1 tablet by mouth daily. 06/21/18   Liane Comber, NP  Cholecalciferol (VITAMIN D3) 5000 units CAPS Take 1 capsule (5,000 Units total) by mouth every other day. 05/02/18   Elgergawy, Silver Huguenin, MD  ezetimibe (ZETIA) 10 MG tablet TAKE 1 TABLET BY MOUTH ONCE DAILY FOR CHOLESTEROL Patient taking differently: Take 10 mg by mouth daily.  06/05/18   [name], MD  famotidine (PEPCID) 20 MG tablet Take 1 tablet (20 mg total) by mouth 2 (two) times daily. Patient not taking: Reported on 07/16/2018 04/10/18   Charlesetta Shanks, MD  hydrOXYzine (ATARAX/VISTARIL) 25 MG tablet Take 1 tablet (25 mg total) by mouth 3 (three) times daily as needed for anxiety. 03/01/18   Starkes-Perry, Gayland Curry, FNP  levothyroxine (SYNTHROID, LEVOTHROID) 75 MCG tablet take 1 tablet by mouth every morning ON AN EMPTY STOMACH, take 1.5 tabs one day a week on Mondays. Patient taking differently: Take 75 mcg by mouth daily.  06/06/18   Liane Comber, NP  lidocaine (XYLOCAINE) 2 % solution Use as directed 15 mLs in the mouth or throat  every 6 (six) hours as needed for mouth pain. 07/23/18   Fatima Blank, MD  omeprazole (PRILOSEC) 40 MG capsule Take 1 capsule (40 mg total) by mouth daily before breakfast. Patient taking differently: Take 40 mg by mouth daily as needed (heartburn).  07/05/18   Esterwood, Amy S, PA-C  ondansetron (ZOFRAN ODT) 8 MG disintegrating tablet Take 1 tablet (8 mg total) by mouth every 8 (eight) hours as needed for nausea or vomiting. 07/16/18   Lajean Saver, MD  ondansetron (ZOFRAN) 8 MG tablet Take 1 tablet 3 x/ day before meals - Nausea Patient taking differently: Take 8 mg by mouth 3 (three) times daily as needed for nausea or vomiting.  04/26/18   [name], MD  oxyCODONE (OXY IR/ROXICODONE) 5 MG immediate release tablet Take 0.5 tablets (2.5 mg total) by mouth every 4 (four) hours as needed for severe pain. Patient not taking: Reported on 07/16/2018 05/02/18   Elgergawy, Silver Huguenin, MD  potassium chloride (K-DUR,KLOR-CON) 20 MEQ tablet Take 1 tablet (20 mEq total) by mouth daily. 05/02/18   Elgergawy, Silver Huguenin, MD  sertraline (ZOLOFT) 100 MG tablet Take 1 tablet (100 mg total) by mouth at bedtime. 08/01/18   Norman Clay, MD  sucralfate (CARAFATE) 1 g tablet Take 1 tablet (1 g total) by mouth 2 (two) times daily. 04/25/18   Antonietta Breach, PA-C  sucralfate (CARAFATE) 1 GM/10ML suspension Take 1 g by mouth daily as needed (stomach pain).    [provider]  traZODone (DESYREL) 50 MG tablet Take 1 tablet (50 mg total) by mouth at bedtime as needed for sleep. Patient not taking: Reported on 07/16/2018 03/01/18   Suella Broad, FNP    Family History Family History  Problem Relation Age of Onset  . Diabetes Mother   . Colon cancer Maternal Grandmother   . Stomach cancer Maternal Grandmother   . Diabetes Maternal Grandmother   . Stomach cancer Maternal Grandfather   . Diabetes Maternal Grandfather     Social History Social History   Tobacco Use  . Smoking status: Current Some Day Smoker  . Smokeless tobacco: Never Used  . Tobacco comment: Marijuana  Substance Use Topics  . Alcohol use: No  . Drug use: Yes    Types: Marijuana     Allergies   Tylenol [acetaminophen]   Review of Systems Review of Systems  Constitutional: Negative for activity change.  Gastrointestinal: Positive for abdominal pain.  Allergic/Immunologic: Negative for immunocompromised state.  Hematological: Does not bruise/bleed easily.      Physical Exam Updated Vital Signs BP 134/74   Pulse 61   Temp 97.6 F (36.4 C) (Oral)   Resp 14   Ht 6\' 3"  (1.905 m)   Wt 86.2 kg   SpO2 100%   BMI 23.75 kg/m   Physical Exam  Constitutional: He is oriented to person, place, and time. He appears well-developed.  HENT:  Head: Atraumatic.  Eyes: Scleral icterus is present.  Neck: Neck supple.  Cardiovascular: Normal rate.  Pulmonary/Chest: Effort normal.  Abdominal: There is tenderness in the right upper quadrant and epigastric area. There is no rebound and no guarding.  Neurological: He is alert and oriented to person, place, and time.  Skin: Skin is warm.  Nursing note and vitals reviewed.    ED Treatments / Results  Labs (all labs ordered are listed, but only abnormal results are displayed) Labs Reviewed  APTT  PROTIME-INR    EKG None  Radiology No results found.  Procedures Procedures (including critical care time)  Medications Ordered in ED Medications - No data to display   Initial Impression / Assessment and Plan / ED Course  I have reviewed the triage vital signs and the nursing notes.  Pertinent labs & imaging results that were available during my care of the patient were reviewed by me and considered in my medical decision making (see chart for details).     27 year old male with history of PSC and SMV thrombosis on anticoagulation comes in with chief complaint of elevated liver enzymes.  Patient's bilirubin is greater than 10 along with elevated transaminases.  I spoke with Alonza Bogus, [facility] and she recommends that patient be admitted to the hospital for further work-up.  They will come and see the patient in give further recommendations.  INR has been ordered at the request of GI team.  Patient's CBC was reassuring and there is no need for emergent imaging, therefore we will proceed with admission.  Final Clinical Impressions(s) / ED Diagnoses   Final diagnoses:  Acute liver  failure without hepatic coma    ED Discharge Orders    None       Varney Biles, MD 08/16/18 1416

## 2018-08-16 NOTE — Consult Note (Signed)
Referring Provider: Dr. Kathrynn Humble, [facility] Primary Care Physician:  [name], MD Primary Gastroenterologist:  Dr. Havery Moros and follows with Dr. Docia Furl, NP in hepatology  Reason for Consultation:  Elevated LFT's, PSC  HPI: Kyle Gonzales is a 27 y.o. male who has diagnosis of small duct PSC, with normal liver biopsy in 01/2017.  He is currently being followed by Dr. Zamor/hepatology at [facility].  He also has history of hypertension, Graves' disease, and major depression.  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  He says that he noticed his eyes turning yellow about a week or so ago and has been having some very mild RUQ abdominal discomfort as well.  Had routine labs performed for hematology today and LFT' were found to be very high with total bili 10.3, ALP 750, AST 303, and ALT 543.  Exactly one month ago LFT's were normal except for ALT of 77.  He tells me that he thinks he was seen at the liver clinic a couple of weeks ago.  Was sent to the ED for admission and evaluation.   Past Medical History:  Diagnosis Date  . GERD (gastroesophageal reflux disease)   . Graves disease 08/18/2015  . Hyperlipidemia   . Hypothyroid   . Palpitations   . PSC (primary sclerosing cholangitis)   . Vitamin D deficiency     Past Surgical History:  Procedure Laterality Date  . ANTERIOR CRUCIATE LIGAMENT REPAIR  2012  . WISDOM TOOTH EXTRACTION      Prior to Admission medications   Medication Sig Start Date End Date Taking? Authorizing Provider  alum & mag hydroxide-simeth (MAALOX ADVANCED MAX ST) 400-400-40 MG/5ML suspension Take 15 mLs by mouth every 6 (six) hours as needed for indigestion. 07/23/18  Yes Cardama, Grayce Sessions, MD  apixaban (ELIQUIS) 5 MG TABS tablet Take 1 tablet (5 mg total) by mouth 2 (two) times daily. 05/21/18  Yes Brunetta Genera, MD  bisoprolol-hydrochlorothiazide  [facility]) 10-6.25 MG tablet Take 1 tablet by mouth daily. 06/21/18  Yes Liane Comber, NP  Cholecalciferol (VITAMIN D3) 5000 units CAPS Take 1 capsule (5,000 Units total) by mouth every other day. 05/02/18  Yes Elgergawy, Silver Huguenin, MD  ezetimibe (ZETIA) 10 MG tablet TAKE 1 TABLET BY MOUTH ONCE DAILY FOR CHOLESTEROL Patient taking differently: Take 10 mg by mouth daily.  06/05/18  Yes [name], MD  hydrOXYzine (ATARAX/VISTARIL) 25 MG tablet Take 1 tablet (25 mg total) by mouth 3 (three) times daily as needed for anxiety. 03/01/18  Yes Starkes-Perry, Gayland Curry, FNP  ibuprofen (ADVIL,MOTRIN) 200 MG tablet Take 600 mg by mouth every 6 (six) hours as needed (For stomach pain.).   Yes [provider]  levothyroxine (SYNTHROID, LEVOTHROID) 75 MCG tablet take 1 tablet by mouth every morning ON AN EMPTY STOMACH, take 1.5 tabs one day a week on Mondays. Patient taking differently: Take 75 mcg by mouth daily.  06/06/18  Yes Liane Comber, NP  lidocaine (XYLOCAINE) 2 % solution Use as directed 15 mLs in the mouth or throat every 6 (six) hours as needed for mouth pain. 07/23/18  Yes Cardama, Grayce Sessions, MD  omeprazole (PRILOSEC) 40 MG capsule Take 1 capsule (40 mg total) by mouth daily before breakfast. Patient taking differently: Take 40 mg by mouth daily as needed (heartburn).  07/05/18  Yes Esterwood, Amy S, PA-C  ondansetron (ZOFRAN) 8 MG tablet Take  1 tablet 3 x/ day before meals - Nausea Patient taking differently: Take 8 mg by mouth 3 (three) times daily as needed for nausea or vomiting.  04/26/18  Yes [name], MD  sucralfate (CARAFATE) 1 g tablet Take 1 tablet (1 g total) by mouth 2 (two) times daily. Patient taking differently: Take 1 g by mouth 2 (two) times daily as needed (For stomach.).  04/25/18  Yes Antonietta Breach, PA-C  sucralfate (CARAFATE) 1 GM/10ML suspension Take 1 g by mouth daily as needed (stomach pain).   Yes [provider]    No current facility-administered  medications for this encounter.    Current Outpatient Medications  Medication Sig Dispense Refill  . alum & mag hydroxide-simeth (MAALOX ADVANCED MAX ST) 400-400-40 MG/5ML suspension Take 15 mLs by mouth every 6 (six) hours as needed for indigestion. 355 mL 0  . apixaban (ELIQUIS) 5 MG TABS tablet Take 1 tablet (5 mg total) by mouth 2 (two) times daily. 60 tablet 2  . bisoprolol-hydrochlorothiazide (ZIAC) 10-6.25 MG tablet Take 1 tablet by mouth daily. 90 tablet 1  . Cholecalciferol (VITAMIN D3) 5000 units CAPS Take 1 capsule (5,000 Units total) by mouth every other day.    . ezetimibe (ZETIA) 10 MG tablet TAKE 1 TABLET BY MOUTH ONCE DAILY FOR CHOLESTEROL (Patient taking differently: Take 10 mg by mouth daily. ) 90 tablet 0  . hydrOXYzine (ATARAX/VISTARIL) 25 MG tablet Take 1 tablet (25 mg total) by mouth 3 (three) times daily as needed for anxiety. 30 tablet 0  . ibuprofen (ADVIL,MOTRIN) 200 MG tablet Take 600 mg by mouth every 6 (six) hours as needed (For stomach pain.).    Marland Kitchen levothyroxine (SYNTHROID, LEVOTHROID) 75 MCG tablet take 1 tablet by mouth every morning ON AN EMPTY STOMACH, take 1.5 tabs one day a week on Mondays. (Patient taking differently: Take 75 mcg by mouth daily. ) 90 tablet 1  . lidocaine (XYLOCAINE) 2 % solution Use as directed 15 mLs in the mouth or throat every 6 (six) hours as needed for mouth pain. 100 mL 0  . omeprazole (PRILOSEC) 40 MG capsule Take 1 capsule (40 mg total) by mouth daily before breakfast. (Patient taking differently: Take 40 mg by mouth daily as needed (heartburn). ) 30 capsule 6  . ondansetron (ZOFRAN) 8 MG tablet Take 1 tablet 3 x/ day before meals - Nausea (Patient taking differently: Take 8 mg by mouth 3 (three) times daily as needed for nausea or vomiting. ) 60 tablet 0  . sucralfate (CARAFATE) 1 g tablet Take 1 tablet (1 g total) by mouth 2 (two) times daily. (Patient taking differently: Take 1 g by mouth 2 (two) times daily as needed (For stomach.). )  60 tablet 1  . sucralfate (CARAFATE) 1 GM/10ML suspension Take 1 g by mouth daily as needed (stomach pain).      Allergies as of 08/16/2018 - Review Complete 08/16/2018  Allergen Reaction Noted  . Tylenol [acetaminophen] Other (See Comments) 01/03/2017    Family History  Problem Relation Age of Onset  . Diabetes Mother   . Colon cancer Maternal Grandmother   . Stomach cancer Maternal Grandmother   . Diabetes Maternal Grandmother   . Stomach cancer Maternal Grandfather   . Diabetes Maternal Grandfather     Social History   Socioeconomic History  . Marital status: Single    Spouse name: Not on file  . Number of children: Not on file  . Years of education: Not on file  .  Highest education level: Not on file  Occupational History  . Not on file  Social Needs  . Financial resource strain: Not on file  . Food insecurity:    Worry: Not on file    Inability: Not on file  . Transportation needs:    Medical: Not on file    Non-medical: Not on file  Tobacco Use  . Smoking status: Current Some Day Smoker  . Smokeless tobacco: Never Used  . Tobacco comment: Marijuana  Substance and Sexual Activity  . Alcohol use: No  . Drug use: Yes    Types: Marijuana  . Sexual activity: Yes  Lifestyle  . Physical activity:    Days per week: Not on file    Minutes per session: Not on file  . Stress: Not on file  Relationships  . Social connections:    Talks on phone: Not on file    Gets together: Not on file    Attends religious service: Not on file    Active member of club or organization: Not on file    Attends meetings of clubs or organizations: Not on file    Relationship status: Not on file  . Intimate partner violence:    Fear of current or ex partner: Not on file    Emotionally abused: Not on file    Physically abused: Not on file    Forced sexual activity: Not on file  Other Topics Concern  . Not on file  Social History Narrative  . Not on file    Review of  Systems: ROS is O/W negative except as mentioned in HPI.  Physical Exam: Vital signs in last 24 hours: Temp:  [97.6 F (36.4 C)] 97.6 F (36.4 C) (11/01 1324) Pulse Rate:  [61] 61 (11/01 1324) Resp:  [14] 14 (11/01 1324) BP: (134)/(74) 134/74 (11/01 1324) SpO2:  [100 %] 100 % (11/01 1324) Weight:  [86.2 kg] 86.2 kg (11/01 1327)   General:  Alert, Well-developed, well-nourished, pleasant and cooperative in NAD Head:  Normocephalic and atraumatic. Eyes:  Scleral icterus noted. Ears:  Normal auditory acuity. Mouth:  No deformity or lesions.   Lungs:  Clear throughout to auscultation.   No wheezes, crackles, or rhonchi.  Heart:  Regular rate and rhythm; no murmurs, clicks, rubs, or gallops. Abdomen:  Soft, non-distended.  BS present.  Minimal TTP in RUQ. Msk:  Symmetrical without gross deformities. Pulses:  Normal pulses noted. Extremities:  Without clubbing or edema. Neurologic:  Alert and  oriented x4;  grossly normal neurologically. Skin:  Intact without significant lesions or rashes. Psych:  Alert and cooperative. Normal mood and affect.  Lab Results: Recent Labs    08/16/18 1150  WBC 4.1  HGB 12.5*  HCT 37.9*  PLT 157   BMET Recent Labs    08/16/18 1150  NA 138  K 4.4  CL 101  CO2 27  GLUCOSE 103*  BUN 9  CREATININE 1.14  CALCIUM 10.1   LFT Recent Labs    08/16/18 1150  PROT 8.3*  ALBUMIN 3.7  AST 303*  ALT 543*  ALKPHOS 750*  BILITOT 10.3*   IMPRESSION:  *27 year old male with small vessel PSC who is presenting with acute increase in LFT's/jaundice.  LFT's about normal one month ago.  Mild RUQ abdominal discomfort but really no other complaints.  ? Bile duct stricture vs mass. *Acute SMV thrombosis in 04/2018:  Was on Eliquis but he told me that he is no longer taking it.  By Dr. Grier Mitts note it said 6 months of therapy, which would be until 10/2018.  PLAN: *Will check MRI abdomen/MRCP to rule out biliary stricture, mass. *Will check acute viral  hepatitis studies. *Will check AFP and CA 19-9. *Trend LFT's.  Janett Billow D. Giannamarie Paulus  08/16/2018, 2:26 PM

## 2018-08-16 NOTE — H&P (Signed)
History and Physical    Kyle Gonzales [id number]:[id number] DOB: 12-22-1990 DOA: 08/16/2018  PCP: [name], MD  Patient coming from: Home  Chief Complaint: Abnormal lab  HPI: Kyle Gonzales is a 27 y.o. 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  Patient presented secondary to an abnormal lab from his hematologist office for routine follow-up.  Patient states that he has had a few days of nausea which is improved with continued vomiting.  He also has some mild right upper quadrant pain.  Afebrile, no chills.  ED Course: Vitals: Afebrile, normal pulse, normal respirations, normotensive, on room air Labs: Alkaline phosphatase of 750, AST of 303, ALT of 543, total bilirubin of 10.3, hemoglobin of 12.5 Imaging: None obtained Medications/Course: None  Review of Systems: Review of Systems  Constitutional: Negative for chills and fever.  Respiratory: Negative for shortness of breath.   Cardiovascular: Negative for chest pain and palpitations.  Gastrointestinal: Positive for abdominal pain (right upper) and nausea. Negative for constipation, diarrhea and vomiting.  All other systems reviewed and are negative.   Past Medical History:  Diagnosis Date  . GERD (gastroesophageal reflux disease)   . Graves disease 08/18/2015  . Hyperlipidemia   . Hypothyroid   . Palpitations   . PSC (primary sclerosing cholangitis)   . Vitamin D deficiency     Past Surgical History:  Procedure Laterality Date  . ANTERIOR CRUCIATE LIGAMENT REPAIR  2012  . WISDOM TOOTH EXTRACTION       reports that he has been smoking. He has never used smokeless tobacco. He reports that he has current or past drug history. Drug: Marijuana. He reports that he does not drink alcohol.  Allergies  Allergen Reactions  . Tylenol [Acetaminophen] Other (See  Comments)    r/t elevated LFTs     Family History  Problem Relation Age of Onset  . Diabetes Mother   . Colon cancer Maternal Grandmother   . Stomach cancer Maternal Grandmother   . Diabetes Maternal Grandmother   . Stomach cancer Maternal Grandfather   . Diabetes Maternal Grandfather    Prior to Admission medications   Medication Sig Start Date End Date Taking? Authorizing Provider  alum & mag hydroxide-simeth (MAALOX ADVANCED MAX ST) 400-400-40 MG/5ML suspension Take 15 mLs by mouth every 6 (six) hours as needed for indigestion. 07/23/18  Yes Cardama, Grayce Sessions, MD  apixaban (ELIQUIS) 5 MG TABS tablet Take 1 tablet (5 mg total) by mouth 2 (two) times daily. 05/21/18  Yes Brunetta Genera, MD  bisoprolol-hydrochlorothiazide [facility]) 10-6.25 MG tablet Take 1 tablet by mouth daily. 06/21/18  Yes Liane Comber, NP  Cholecalciferol (VITAMIN D3) 5000 units CAPS Take 1 capsule (5,000 Units total) by mouth every other day. 05/02/18  Yes Elgergawy, Silver Huguenin, MD  ezetimibe (ZETIA) 10 MG tablet TAKE 1 TABLET BY MOUTH ONCE DAILY FOR CHOLESTEROL Patient taking differently: Take 10 mg by mouth daily.  06/05/18  Yes [name], MD  hydrOXYzine (ATARAX/VISTARIL) 25 MG tablet Take 1 tablet (25 mg total) by mouth 3 (three) times daily as needed for anxiety. 03/01/18  Yes Starkes-Perry, Gayland Curry, FNP  ibuprofen (ADVIL,MOTRIN) 200 MG tablet Take 600 mg by mouth every 6 (six) hours as needed (For stomach pain.).   Yes [provider]  levothyroxine (SYNTHROID, LEVOTHROID) 75 MCG tablet take 1 tablet by mouth every morning ON AN EMPTY STOMACH, take 1.5 tabs one day  a week on Mondays. Patient taking differently: Take 75 mcg by mouth daily.  06/06/18  Yes Liane Comber, NP  lidocaine (XYLOCAINE) 2 % solution Use as directed 15 mLs in the mouth or throat every 6 (six) hours as needed for mouth pain. 07/23/18  Yes Cardama, Grayce Sessions, MD  omeprazole (PRILOSEC) 40 MG capsule Take 1 capsule (40 mg  total) by mouth daily before breakfast. Patient taking differently: Take 40 mg by mouth daily as needed (heartburn).  07/05/18  Yes Esterwood, Amy S, PA-C  ondansetron (ZOFRAN) 8 MG tablet Take 1 tablet 3 x/ day before meals - Nausea Patient taking differently: Take 8 mg by mouth 3 (three) times daily as needed for nausea or vomiting.  04/26/18  Yes [name], MD  sucralfate (CARAFATE) 1 g tablet Take 1 tablet (1 g total) by mouth 2 (two) times daily. Patient taking differently: Take 1 g by mouth 2 (two) times daily as needed (For stomach.).  04/25/18  Yes Antonietta Breach, PA-C  sucralfate (CARAFATE) 1 GM/10ML suspension Take 1 g by mouth daily as needed (stomach pain).   Yes [provider]    Physical Exam:  Physical Exam  Constitutional: He is oriented to person, place, and time. He appears well-developed and well-nourished. No distress.  HENT:  Mouth/Throat: Oropharynx is clear and moist.  Eyes: Pupils are equal, round, and reactive to light. Conjunctivae and EOM are normal. Scleral icterus is present.  Neck: Normal range of motion.  Cardiovascular: Normal rate, regular rhythm and normal heart sounds.  No murmur heard. Pulmonary/Chest: Effort normal and breath sounds normal. No respiratory distress. He has no wheezes. He has no rales.  Abdominal: Soft. Bowel sounds are normal. He exhibits no distension. There is no tenderness. There is no rebound and no guarding.  Musculoskeletal: Normal range of motion. He exhibits no edema or tenderness.  Lymphadenopathy:    He has no cervical adenopathy.  Neurological: He is alert and oriented to person, place, and time.  Skin: Skin is warm and dry. He is not diaphoretic.  Psychiatric: He has a normal mood and affect.  Vitals reviewed.    Labs on Admission: I have personally reviewed following labs and imaging studies  CBC: Recent Labs  Lab 08/16/18 1150  WBC 4.1  NEUTROABS 2.8  HGB 12.5*  HCT 37.9*  MCV 88.6  PLT 157     Basic Metabolic Panel: Recent Labs  Lab 08/16/18 1150  NA 138  K 4.4  CL 101  CO2 27  GLUCOSE 103*  BUN 9  CREATININE 1.14  CALCIUM 10.1    GFR: Estimated Creatinine Clearance: 118.4 mL/min (by C-G formula based on SCr of 1.14 mg/dL).  Liver Function Tests: Recent Labs  Lab 08/16/18 1150  AST 303*  ALT 543*  ALKPHOS 750*  BILITOT 10.3*  PROT 8.3*  ALBUMIN 3.7   No results for input(s): LIPASE, AMYLASE in the last 168 hours. No results for input(s): AMMONIA in the last 168 hours.  Coagulation Profile: Recent Labs  Lab 08/16/18 1509  INR 0.88    Urine analysis:    Component Value Date/Time   COLORURINE YELLOW 07/16/2018 1329   APPEARANCEUR CLEAR 07/16/2018 1329   LABSPEC 1.016 07/16/2018 1329   PHURINE 9.0 (H) 07/16/2018 1329   GLUCOSEU NEGATIVE 07/16/2018 1329   HGBUR NEGATIVE 07/16/2018 1329   BILIRUBINUR NEGATIVE 07/16/2018 1329   KETONESUR NEGATIVE 07/16/2018 1329   PROTEINUR NEGATIVE 07/16/2018 1329   NITRITE NEGATIVE 07/16/2018 1329   LEUKOCYTESUR NEGATIVE 07/16/2018  [place] on Admission: No results found.   Assessment/Plan Active Problems:   PSC (primary sclerosing cholangitis)   Abnormal liver enzymes   Abnormal liver enzymes Acute. Patient with a history of small duct PSC.  Concern for biliary pathology.  Alkaline phosphatase, AST, ALT, bilirubin all elevated.  Afebrile. INR normal -Provide GI recommendations: MRCP pending -Hepatic function panel tomorrow morning  Graves' disease Hypothyroidism -Continue Synthroid  Superior mesenteric vein thrombosis Currently follows with Dr. Irene Limbo at the cancer center.  He is on Eliquis twice daily -Continue Eliquis  Anxiety -Continue atarax  Hyperlipidemia On Zetia as an outpatient -Held Zetia  Vitamin D deficiency -Continue home supplementation  Depression History of suicidal ideation earlier this year requiring inpatient admission. Not currently on  pharmacotherapy.   DVT prophylaxis: Eliquis Code Status: Full code Family Communication: Mother at bedside Disposition Plan: Medical floor Consults called: [place] [facility] Admission status: Inpatient   Cordelia Poche, MD [facility] 08/16/2018, 5:16 PM  If 7PM-7AM, please contact night-coverage [email or web address] Password [email or web address]

## 2018-08-16 NOTE — ED Notes (Signed)
ED TO INPATIENT HANDOFF REPORT  Name/Age/Gender Kyle Gonzales 27 y.o. male  Code Status Code Status History    Date Active Date Inactive Code Status Order ID Comments User Context   04/27/2018 0233 05/02/2018 1902 Full Code [id number]  Toy Baker, MD Inpatient   02/26/2018 1702 03/01/2018 2059 Full Code [id number]  Ethelene Hal, NP Inpatient   02/26/2018 0457 02/26/2018 1608 Full Code [id number]  Varney Biles, MD ED      Home/SNF/Other Home  Chief Complaint Abnormal Labs  Level of Care/Admitting Diagnosis ED Disposition    ED Disposition Condition F[place]: [facility] [[id number]]  Level of Care: Med-Surg [16]  Diagnosis: Abnormal liver enzymes [[id number]]  Admitting Physician: Mariel Aloe [[id number]]  Attending Physician: Mariel Aloe [phone]  Estimated length of stay: past midnight tomorrow  Certification:: I certify this patient will need inpatient services for at least 2 midnights  PT Class (Do Not Modify): Inpatient [101]  PT Acc Code (Do Not Modify): Private [1]       Medical History Past Medical History:  Diagnosis Date  . GERD (gastroesophageal reflux disease)   . Graves disease 08/18/2015  . Hyperlipidemia   . Hypothyroid   . Palpitations   . PSC (primary sclerosing cholangitis)   . Vitamin D deficiency     Allergies Allergies  Allergen Reactions  . Tylenol [Acetaminophen] Other (See Comments)    r/t elevated LFTs     IV Location/Drains/Wounds Patient Lines/Drains/Airways Status   Active Line/Drains/Airways    None          Labs/Imaging Results for orders placed or performed during the hospital encounter of 08/16/18 (from the past 48 hour(s))  APTT     Status: None   Collection Time: 08/16/18  3:09 PM  Result Value Ref Range   aPTT 29 24 - 36 seconds    Comment: Performed at [facility], [place] [address], [place], [place] [address]  Protime-INR     Status: None   Collection Time: 08/16/18  3:09 PM  Result Value Ref Range   Prothrombin Time 11.9 11.4 - 15.2 seconds   INR 0.88     Comment: Performed at [facility], [place] [address], [place], [place] [address]   No results found. None  Pending Labs Unresulted Labs (From admission, onward)    Start     Ordered   08/16/18 1426  Hepatitis panel, acute  Once,   R     08/16/18 1425   08/16/18 1424  AFP tumor marker  Once,   R     08/16/18 1425   08/16/18 1424  Cancer antigen 19-9  Once,   R     08/16/18 1425          Vitals/Pain Today's Vitals   08/16/18 1324 08/16/18 1327 08/16/18 1329  BP: 134/74    Pulse: 61    Resp: 14    Temp: 97.6 F (36.4 C)    TempSrc: Oral    SpO2: 100%    Weight:  86.2 kg   Height:  6\' 3"  (1.905 m)   PainSc:   3     Isolation Precautions No active isolations  Medications Medications - No data to display  Mobility walks

## 2018-08-16 NOTE — ED Triage Notes (Signed)
Pt sent from cancer center getting labs done and told to come straight here related to elevated liver enzymes AST 303, ALT 543, and total bilirubin 10.3.

## 2018-08-17 DIAGNOSIS — R933 Abnormal findings on diagnostic imaging of other parts of digestive tract: Secondary | ICD-10-CM

## 2018-08-17 LAB — HEPATIC FUNCTION PANEL
ALBUMIN: 4 g/dL (ref 3.5–5.0)
ALK PHOS: 592 U/L — AB (ref 38–126)
ALT: 470 U/L — AB (ref 0–44)
AST: 306 U/L — AB (ref 15–41)
BILIRUBIN TOTAL: 7.8 mg/dL — AB (ref 0.3–1.2)
Bilirubin, Direct: 4.4 mg/dL — ABNORMAL HIGH (ref 0.0–0.2)
Indirect Bilirubin: 3.4 mg/dL — ABNORMAL HIGH (ref 0.3–0.9)
Total Protein: 7.9 g/dL (ref 6.5–8.1)

## 2018-08-17 LAB — AFP TUMOR MARKER: AFP, SERUM, TUMOR MARKER: 1.3 ng/mL (ref 0.0–8.3)

## 2018-08-17 LAB — CBC
HEMATOCRIT: 37.4 % — AB (ref 39.0–52.0)
Hemoglobin: 12.1 g/dL — ABNORMAL LOW (ref 13.0–17.0)
MCH: 29.1 pg (ref 26.0–34.0)
MCHC: 32.4 g/dL (ref 30.0–36.0)
MCV: 89.9 fL (ref 80.0–100.0)
NRBC: 0 % (ref 0.0–0.2)
Platelets: 147 10*3/uL — ABNORMAL LOW (ref 150–400)
RBC: 4.16 MIL/uL — ABNORMAL LOW (ref 4.22–5.81)
RDW: 18.8 % — AB (ref 11.5–15.5)
WBC: 4.3 10*3/uL (ref 4.0–10.5)

## 2018-08-17 LAB — BASIC METABOLIC PANEL
ANION GAP: 14 (ref 5–15)
BUN: 8 mg/dL (ref 6–20)
CALCIUM: 9.7 mg/dL (ref 8.9–10.3)
CO2: 24 mmol/L (ref 22–32)
Chloride: 102 mmol/L (ref 98–111)
Creatinine, Ser: 0.98 mg/dL (ref 0.61–1.24)
GFR calc Af Amer: >60 mL/min (ref >60)
GFR calc non Af Amer: >60 mL/min (ref >60)
GLUCOSE: 120 mg/dL — AB (ref 70–99)
Potassium: 3.5 mmol/L (ref 3.5–5.1)
Sodium: 140 mmol/L (ref 135–145)

## 2018-08-17 LAB — CARDIOLIPIN ANTIBODIES, IGG, IGM, IGA
Anticardiolipin IgG: <9 GPL U/mL (ref 0–14)
Anticardiolipin IgM: 14 MPL U/mL — ABNORMAL HIGH (ref 0–12)

## 2018-08-17 LAB — HEPATITIS PANEL, ACUTE
HCV Ab: <0.1 s/co ratio (ref 0.0–0.9)
HEP B C IGM: NEGATIVE
HEP B S AG: NEGATIVE
Hep A IgM: NEGATIVE

## 2018-08-17 LAB — CANCER ANTIGEN 19-9: CAN 19-9: 207 U/mL — AB (ref 0–35)

## 2018-08-17 LAB — BILIRUBIN, DIRECT: Bilirubin, Direct: 4.4 mg/dL — ABNORMAL HIGH (ref 0.0–0.2)

## 2018-08-17 MED ORDER — CIPROFLOXACIN IN D5W 400 MG/200ML IV SOLN
400.0000 mg | Freq: Two times a day (BID) | INTRAVENOUS | Status: DC
  Administered 2018-08-17 – 2018-08-21 (×8): 400 mg via INTRAVENOUS
  Filled 2018-08-17 (×8): qty 200

## 2018-08-17 MED ORDER — NYSTATIN 100000 UNIT/ML MT SUSP
5.0000 mL | Freq: Four times a day (QID) | OROMUCOSAL | Status: DC
  Administered 2018-08-17 – 2018-08-21 (×12): 500000 [IU] via ORAL
  Filled 2018-08-17 (×14): qty 5

## 2018-08-17 NOTE — Progress Notes (Signed)
Pt refused PM Eliquis. States that MD mentioned that yesterday (10/31) was the last day of taking med. Doesn't want to take med without discussing it with MD in AM.

## 2018-08-17 NOTE — Progress Notes (Signed)
PROGRESS NOTE    Kyle Gonzales   GEZ:[id number]  DOB: [birth year]  DOA: 08/16/2018 PCP: [name], MD   Brief Narrative:  Kyle Gonzales is a 27 y.o. 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  Patient presented secondary to an abnormal lab from his hematologist office for routine follow-up.  Patient states that he has had a few days of nausea which is improved with continued vomiting.  He also has some mild right upper quadrant pain.     Subjective: He has no complaints today ROS: no complaints of nausea, vomiting, constipation diarrhea, cough, dyspnea or dysuria. No other complaints.   Assessment & Plan:   Principal Problem:   PSC (primary sclerosing cholangitis)/   Elevated liver enzymes - MRCP shows advancement of disease- GI to discuss ERCP - cont to follow LFTs - Eliquis should be held  Active Problems:     DVT (deep venous thrombosis) - took Eliquis today- will hold for possible procedure  HTN - Ziac  Thrush - start Nystatin    Graves disease/  Hypothyroidism - s/p ablation- Synthroid      DVT prophylaxis: stop Eliquis- SCDS  Code Status: Full code Family Communication: mother at bedside Disposition Plan: home when stable Consultants:   GI Procedures:     Antimicrobials:  Anti-infectives (From admission, onward)   None       Objective: Vitals:   08/16/18 1324 08/16/18 1327 08/16/18 2209  BP: 134/74  (!) 143/76  Pulse: 61  79  Resp: 14  (!) 22  Temp: 97.6 F (36.4 C)  98.9 F (37.2 C)  TempSrc: Oral    SpO2: 100%  100%  Weight:  86.2 kg   Height:  6\' 3"  (1.905 m)     Intake/Output Summary (Last 24 hours) at 08/17/2018 1205 Last data filed at 08/17/2018 1000 Gross per 24 hour  Intake 240 ml  Output -  Net 240 ml   Filed Weights   08/16/18 1327  Weight: 86.2  kg    Examination: General exam: Appears comfortable  HEENT: PERRLA, oral mucosa moist, no sclera icterus or thrush Respiratory system: Clear to auscultation. Respiratory effort normal. Cardiovascular system: S1 & S2 heard, RRR.   Gastrointestinal system: Abdomen soft, non-tender, nondistended. Normal bowel sounds. Central nervous system: Alert and oriented. No focal neurological deficits. Extremities: No cyanosis, clubbing or edema Skin: No rashes or ulcers Psychiatry:  Depressed   Data Reviewed: I have personally reviewed following labs and imaging studies  CBC: Recent Labs  Lab 08/16/18 1150 08/17/18 0535  WBC 4.1 4.3  NEUTROABS 2.8  --   HGB 12.5* 12.1*  HCT 37.9* 37.4*  MCV 88.6 89.9  PLT 157 650*   Basic Metabolic Panel: Recent Labs  Lab 08/16/18 1150 08/17/18 0535  NA 138 140  K 4.4 3.5  CL 101 102  CO2 27 24  GLUCOSE 103* 120*  BUN 9 8  CREATININE 1.14 0.98  CALCIUM 10.1 9.7   GFR: Estimated Creatinine Clearance: 137.7 mL/min (by C-G formula based on SCr of 0.98 mg/dL). Liver Function Tests: Recent Labs  Lab 08/16/18 1150  AST 303*  ALT 543*  ALKPHOS 750*  BILITOT 10.3*  PROT 8.3*  ALBUMIN 3.7   No results for input(s): LIPASE, AMYLASE in the last 168 hours. No results for input(s): AMMONIA in the last 168 hours. Coagulation Profile: Recent Labs  Lab 08/16/18 1509  INR  0.88   Cardiac Enzymes: No results for input(s): CKTOTAL, CKMB, CKMBINDEX, TROPONINI in the last 168 hours. BNP (last 3 results) No results for input(s): PROBNP in the last 8760 hours. HbA1C: No results for input(s): HGBA1C in the last 72 hours. CBG: No results for input(s): GLUCAP in the last 168 hours. Lipid Profile: No results for input(s): CHOL, HDL, LDLCALC, TRIG, CHOLHDL, LDLDIRECT in the last 72 hours. Thyroid Function Tests: No results for input(s): TSH, T4TOTAL, FREET4, T3FREE, THYROIDAB in the last 72 hours. Anemia Panel: No results for input(s): VITAMINB12,  FOLATE, FERRITIN, TIBC, IRON, RETICCTPCT in the last 72 hours. Urine analysis:    Component Value Date/Time   COLORURINE YELLOW 07/16/2018 1329   APPEARANCEUR CLEAR 07/16/2018 1329   LABSPEC 1.016 07/16/2018 1329   PHURINE 9.0 (H) 07/16/2018 1329   GLUCOSEU NEGATIVE 07/16/2018 1329   HGBUR NEGATIVE 07/16/2018 1329   BILIRUBINUR NEGATIVE 07/16/2018 1329   KETONESUR NEGATIVE 07/16/2018 1329   PROTEINUR NEGATIVE 07/16/2018 1329   NITRITE NEGATIVE 07/16/2018 1329   LEUKOCYTESUR NEGATIVE 07/16/2018 1329   Sepsis Labs: @LABRCNTIP (procalcitonin:4,lacticidven:4) )No results found for this or any previous visit (from the past 240 hour(s)).       Radiology Studies: [name]  Result Date: 08/16/2018 CLINICAL DATA:  Pain and abnormal liver function test. History of sclerosing cholangitis. EXAM: MRI ABDOMEN WITHOUT AND WITH CONTRAST (INCLUDING MRCP) TECHNIQUE: Multiplanar multisequence MR imaging of the abdomen was performed both before and after the administration of intravenous contrast. Heavily T2-weighted images of the biliary and pancreatic ducts were obtained, and three-dimensional MRCP images were rendered by post processing. CONTRAST:  10 cc Gadavist COMPARISON:  MRI 04/26/2018. FINDINGS: Lower chest: No acute findings. Hepatobiliary: There is marked intrahepatic bile duct dilatation which appears progressive compared with previous exam. Normal caliber of the common bile duct. Diffuse gallbladder distension is again noted. Sludge and/or tiny stones stones. Unchanged appearance of hemangiomas within the right lobe of liver, image 33/01/12/2002 and image 52/1304. Pancreas: No mass, inflammatory changes, or other parenchymal abnormality identified. Spleen:  Within normal limits in size and appearance. Adrenals/Urinary Tract: Normal appearance of the adrenal glands. Kidneys are unremarkable. Stomach/Bowel: Visualized portions within the abdomen are unremarkable. Vascular/Lymphatic:  Normal appearance of the abdominal aorta. Considerable motion artifact diminishes mesenteric vascular detail. There is a diminutive superior mesenteric vein. The splenic vein remains patent. 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 No adenopathy identified. Other:  No ascites. Musculoskeletal: No suspicious bone lesions identified. IMPRESSION: 1. There is been interval progression of intrahepatic biliary ductal dilatation without common bile duct dilatation. 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 2. Small amount of gallbladder sludge or tiny stones noted. 3. 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 The superior mesenteric vein appears diminutive. 4. Right lobe of liver hemangiomas. Electronically Signed   By: Kerby Moors M.D.   On: 08/16/2018 22:35   Mr Abdomen Mrcp [name]  Result Date: 08/16/2018 CLINICAL DATA:  Pain and abnormal liver function test. History of sclerosing cholangitis. EXAM: MRI ABDOMEN WITHOUT AND WITH CONTRAST (INCLUDING MRCP) TECHNIQUE: Multiplanar multisequence MR imaging of the abdomen was performed both before and after the administration of intravenous contrast. Heavily T2-weighted images of the biliary and pancreatic ducts were obtained, and three-dimensional MRCP images were rendered by post processing. CONTRAST:  10 cc Gadavist COMPARISON:  MRI 04/26/2018. FINDINGS: Lower chest: No acute findings. Hepatobiliary: There is marked intrahepatic bile  duct dilatation which appears progressive compared with previous exam. Normal caliber of the common bile duct. Diffuse gallbladder distension is again noted. Sludge and/or tiny stones stones. Unchanged appearance of hemangiomas within the right lobe of liver, image 33/01/12/2002 and image 52/1304. Pancreas: No mass, inflammatory changes, or other parenchymal abnormality identified. Spleen:  Within normal limits in  size and appearance. Adrenals/Urinary Tract: Normal appearance of the adrenal glands. Kidneys are unremarkable. Stomach/Bowel: Visualized portions within the abdomen are unremarkable. Vascular/Lymphatic: Normal appearance of the abdominal aorta. Considerable motion artifact diminishes mesenteric vascular detail. There is a diminutive superior mesenteric vein. The splenic vein remains patent. 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 No adenopathy identified. Other:  No ascites. Musculoskeletal: No suspicious bone lesions identified. IMPRESSION: 1. There is been interval progression of intrahepatic biliary ductal dilatation without common bile duct dilatation. 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 2. Small amount of gallbladder sludge or tiny stones noted. 3. 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 The superior mesenteric vein appears diminutive. 4. Right lobe of liver hemangiomas. Electronically Signed   By: Kerby Moors M.D.   On: 08/16/2018 22:35      Scheduled Meds: . bisoprolol-hydrochlorothiazide  1 tablet Oral Daily  . cholecalciferol  5,000 Units Oral QODAY  . levothyroxine  75 mcg Oral Q0600  . nystatin  5 mL Oral QID  . pantoprazole  40 mg Oral Daily   Continuous Infusions:   LOS: 1 day    Time spent in minutes: 35    Debbe Odea, MD [facility] Pager: [email or web address] Password [email or web address] 08/17/2018, 12:05 PM

## 2018-08-17 NOTE — Progress Notes (Signed)
[facility] Progress Note  CC:  PSC, jaundice  Subjective:  CA 19-9 is elevated at 207.  Today's hepatic function panel is still pending.  MRI abdomen/MRCP showed the following:  IMPRESSION: 1. There is been interval progression of intrahepatic biliary ductal dilatation without common bile duct dilatation. 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 2. Small amount of gallbladder sludge or tiny stones noted. 3. 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 The superior mesenteric vein appears diminutive. 4. Right lobe of liver hemangiomas.  Feels ok.  Abdomen does not really feel sore today.  Got eliquis dose this AM.  Objective:  Vital signs in last 24 hours: Temp:  [97.6 F (36.4 C)-98.9 F (37.2 C)] 98.9 F (37.2 C) (11/01 2209) Pulse Rate:  [61-79] 79 (11/01 2209) Resp:  [14-22] 22 (11/01 2209) BP: (134-143)/(74-76) 143/76 (11/01 2209) SpO2:  [100 %] 100 % (11/01 2209) Weight:  [86.2 kg] 86.2 kg (11/01 1327) Last BM Date: 08/15/18 General:  Alert, Well-developed, in NAD; scleral icterus noted. Heart:  Regular rate and rhythm; no murmurs Pulm:  CTAB.  No increased WOB. Abdomen:  Soft, non-distended.  BS present.  Non-tender. Extremities:  Without edema. Neurologic:  Alert and oriented x 4;  grossly normal neurologically. Psych:  Alert and cooperative. Normal mood and affect.  Lab Results: Recent Labs    08/16/18 1150 08/17/18 0535  WBC 4.1 4.3  HGB 12.5* 12.1*  HCT 37.9* 37.4*  PLT 157 147*   BMET Recent Labs    08/16/18 1150 08/17/18 0535  NA 138 140  K 4.4 3.5  CL 101 102  CO2 27 24  GLUCOSE 103* 120*  BUN 9 8  CREATININE 1.14 0.98  CALCIUM 10.1 9.7   LFT Recent Labs    08/16/18 1150 08/17/18 0535  PROT 8.3*  --   ALBUMIN 3.7  --   AST 303*  --   ALT 543*  --   ALKPHOS 750*  --   BILITOT 10.3*  --   BILIDIR  --  4.4*   PT/INR Recent Labs    08/16/18 1509  LABPROT 11.9   INR 0.88   Hepatitis Panel Recent Labs    08/16/18 1510  HEPBSAG Negative  HCVAB <0.1  HEPAIGM Negative  HEPBIGM Negative    [name]  Result Date: 08/16/2018 CLINICAL DATA:  Pain and abnormal liver function test. History of sclerosing cholangitis. EXAM: MRI ABDOMEN WITHOUT AND WITH CONTRAST (INCLUDING MRCP) TECHNIQUE: Multiplanar multisequence MR imaging of the abdomen was performed both before and after the administration of intravenous contrast. Heavily T2-weighted images of the biliary and pancreatic ducts were obtained, and three-dimensional MRCP images were rendered by post processing. CONTRAST:  10 cc Gadavist COMPARISON:  MRI 04/26/2018. FINDINGS: Lower chest: No acute findings. Hepatobiliary: There is marked intrahepatic bile duct dilatation which appears progressive compared with previous exam. Normal caliber of the common bile duct. Diffuse gallbladder distension is again noted. Sludge and/or tiny stones stones. Unchanged appearance of hemangiomas within the right lobe of liver, image 33/01/12/2002 and image 52/1304. Pancreas: No mass, inflammatory changes, or other parenchymal abnormality identified. Spleen:  Within normal limits in size and appearance. Adrenals/Urinary Tract: Normal appearance of the adrenal glands. Kidneys are unremarkable. Stomach/Bowel: Visualized portions within the abdomen are unremarkable. Vascular/Lymphatic: Normal appearance of the abdominal aorta. Considerable motion artifact diminishes mesenteric vascular detail. There is a diminutive superior mesenteric vein. The splenic vein remains patent. 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 No adenopathy identified. Other:  No ascites. Musculoskeletal: No suspicious bone lesions identified. IMPRESSION: 1. There is been interval progression of intrahepatic biliary ductal dilatation without common bile duct dilatation. Findings are compatible  with history of primary sclerosing cholangitis. 2. Small amount of gallbladder sludge or tiny stones noted. 3. 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 The superior mesenteric vein appears diminutive. 4. Right lobe of liver hemangiomas. Electronically Signed   By: Kerby Moors M.D.   On: 08/16/2018 22:35   Mr Abdomen Mrcp [name]  Result Date: 08/16/2018 CLINICAL DATA:  Pain and abnormal liver function test. History of sclerosing cholangitis. EXAM: MRI ABDOMEN WITHOUT AND WITH CONTRAST (INCLUDING MRCP) TECHNIQUE: Multiplanar multisequence MR imaging of the abdomen was performed both before and after the administration of intravenous contrast. Heavily T2-weighted images of the biliary and pancreatic ducts were obtained, and three-dimensional MRCP images were rendered by post processing. CONTRAST:  10 cc Gadavist COMPARISON:  MRI 04/26/2018. FINDINGS: Lower chest: No acute findings. Hepatobiliary: There is marked intrahepatic bile duct dilatation which appears progressive compared with previous exam. Normal caliber of the common bile duct. Diffuse gallbladder distension is again noted. Sludge and/or tiny stones stones. Unchanged appearance of hemangiomas within the right lobe of liver, image 33/01/12/2002 and image 52/1304. Pancreas: No mass, inflammatory changes, or other parenchymal abnormality identified. Spleen:  Within normal limits in size and appearance. Adrenals/Urinary Tract: Normal appearance of the adrenal glands. Kidneys are unremarkable. Stomach/Bowel: Visualized portions within the abdomen are unremarkable. Vascular/Lymphatic: Normal appearance of the abdominal aorta. Considerable motion artifact diminishes mesenteric vascular detail. There is a diminutive superior mesenteric vein. The splenic vein remains patent. 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 No adenopathy identified. Other:  No ascites.  Musculoskeletal: No suspicious bone lesions identified. IMPRESSION: 1. There is been interval progression of intrahepatic biliary ductal dilatation without common bile duct dilatation. 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 2. Small amount of gallbladder sludge or tiny stones noted. 3. 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 The superior mesenteric vein appears diminutive. 4. Right lobe of liver hemangiomas. Electronically Signed   By: Kerby Moors M.D.   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  LFT's about normal one month ago.  Mild RUQ abdominal discomfort but really no other complaints.  ? Bile duct stricture vs mass.  MRCP shows interval progression of intrahepatic biliary ductal dilitation without CBD dilation.  CA 19-9 is quite high. 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  Last dose this AM, 11/2, but now has been discontinued in case of EUS/ERCP in near future.  -Dr. Havery Moros to discuss EUS/ERCP with Dr. Ardis Hughs. -Trend LFT's.  Today's levels still pending. -Hold Eliquis for now.   LOS: 1 day   Laban Emperor. Sharena Dibenedetto  08/17/2018, 9:13 AM

## 2018-08-17 NOTE — H&P (View-Only) (Signed)
[facility] Progress Note  CC:  PSC, jaundice  Subjective:  CA 19-9 is elevated at 207.  Today's hepatic function panel is still pending.  MRI abdomen/MRCP showed the following:  IMPRESSION: 1. There is been interval progression of intrahepatic biliary ductal dilatation without common bile duct dilatation. 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 2. Small amount of gallbladder sludge or tiny stones noted. 3. 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 The superior mesenteric vein appears diminutive. 4. Right lobe of liver hemangiomas.  Feels ok.  Abdomen does not really feel sore today.  Got eliquis dose this AM.  Objective:  Vital signs in last 24 hours: Temp:  [97.6 F (36.4 C)-98.9 F (37.2 C)] 98.9 F (37.2 C) (11/01 2209) Pulse Rate:  [61-79] 79 (11/01 2209) Resp:  [14-22] 22 (11/01 2209) BP: (134-143)/(74-76) 143/76 (11/01 2209) SpO2:  [100 %] 100 % (11/01 2209) Weight:  [86.2 kg] 86.2 kg (11/01 1327) Last BM Date: 08/15/18 General:  Alert, Well-developed, in NAD; scleral icterus noted. Heart:  Regular rate and rhythm; no murmurs Pulm:  CTAB.  No increased WOB. Abdomen:  Soft, non-distended.  BS present.  Non-tender. Extremities:  Without edema. Neurologic:  Alert and oriented x 4;  grossly normal neurologically. Psych:  Alert and cooperative. Normal mood and affect.  Lab Results: Recent Labs    08/16/18 1150 08/17/18 0535  WBC 4.1 4.3  HGB 12.5* 12.1*  HCT 37.9* 37.4*  PLT 157 147*   BMET Recent Labs    08/16/18 1150 08/17/18 0535  NA 138 140  K 4.4 3.5  CL 101 102  CO2 27 24  GLUCOSE 103* 120*  BUN 9 8  CREATININE 1.14 0.98  CALCIUM 10.1 9.7   LFT Recent Labs    08/16/18 1150 08/17/18 0535  PROT 8.3*  --   ALBUMIN 3.7  --   AST 303*  --   ALT 543*  --   ALKPHOS 750*  --   BILITOT 10.3*  --   BILIDIR  --  4.4*   PT/INR Recent Labs    08/16/18 1509  LABPROT 11.9   INR 0.88   Hepatitis Panel Recent Labs    08/16/18 1510  HEPBSAG Negative  HCVAB <0.1  HEPAIGM Negative  HEPBIGM Negative    [name]  Result Date: 08/16/2018 CLINICAL DATA:  Pain and abnormal liver function test. History of sclerosing cholangitis. EXAM: MRI ABDOMEN WITHOUT AND WITH CONTRAST (INCLUDING MRCP) TECHNIQUE: Multiplanar multisequence MR imaging of the abdomen was performed both before and after the administration of intravenous contrast. Heavily T2-weighted images of the biliary and pancreatic ducts were obtained, and three-dimensional MRCP images were rendered by post processing. CONTRAST:  10 cc Gadavist COMPARISON:  MRI 04/26/2018. FINDINGS: Lower chest: No acute findings. Hepatobiliary: There is marked intrahepatic bile duct dilatation which appears progressive compared with previous exam. Normal caliber of the common bile duct. Diffuse gallbladder distension is again noted. Sludge and/or tiny stones stones. Unchanged appearance of hemangiomas within the right lobe of liver, image 33/01/12/2002 and image 52/1304. Pancreas: No mass, inflammatory changes, or other parenchymal abnormality identified. Spleen:  Within normal limits in size and appearance. Adrenals/Urinary Tract: Normal appearance of the adrenal glands. Kidneys are unremarkable. Stomach/Bowel: Visualized portions within the abdomen are unremarkable. Vascular/Lymphatic: Normal appearance of the abdominal aorta. Considerable motion artifact diminishes mesenteric vascular detail. There is a diminutive superior mesenteric vein. The splenic vein remains patent. 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 No adenopathy identified. Other:  No ascites. Musculoskeletal: No suspicious bone lesions identified. IMPRESSION: 1. There is been interval progression of intrahepatic biliary ductal dilatation without common bile duct dilatation. Findings are compatible  with history of primary sclerosing cholangitis. 2. Small amount of gallbladder sludge or tiny stones noted. 3. 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 The superior mesenteric vein appears diminutive. 4. Right lobe of liver hemangiomas. Electronically Signed   By: Kerby Moors M.D.   On: 08/16/2018 22:35   Mr Abdomen Mrcp [name]  Result Date: 08/16/2018 CLINICAL DATA:  Pain and abnormal liver function test. History of sclerosing cholangitis. EXAM: MRI ABDOMEN WITHOUT AND WITH CONTRAST (INCLUDING MRCP) TECHNIQUE: Multiplanar multisequence MR imaging of the abdomen was performed both before and after the administration of intravenous contrast. Heavily T2-weighted images of the biliary and pancreatic ducts were obtained, and three-dimensional MRCP images were rendered by post processing. CONTRAST:  10 cc Gadavist COMPARISON:  MRI 04/26/2018. FINDINGS: Lower chest: No acute findings. Hepatobiliary: There is marked intrahepatic bile duct dilatation which appears progressive compared with previous exam. Normal caliber of the common bile duct. Diffuse gallbladder distension is again noted. Sludge and/or tiny stones stones. Unchanged appearance of hemangiomas within the right lobe of liver, image 33/01/12/2002 and image 52/1304. Pancreas: No mass, inflammatory changes, or other parenchymal abnormality identified. Spleen:  Within normal limits in size and appearance. Adrenals/Urinary Tract: Normal appearance of the adrenal glands. Kidneys are unremarkable. Stomach/Bowel: Visualized portions within the abdomen are unremarkable. Vascular/Lymphatic: Normal appearance of the abdominal aorta. Considerable motion artifact diminishes mesenteric vascular detail. There is a diminutive superior mesenteric vein. The splenic vein remains patent. 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 No adenopathy identified. Other:  No ascites.  Musculoskeletal: No suspicious bone lesions identified. IMPRESSION: 1. There is been interval progression of intrahepatic biliary ductal dilatation without common bile duct dilatation. 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 2. Small amount of gallbladder sludge or tiny stones noted. 3. 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 The superior mesenteric vein appears diminutive. 4. Right lobe of liver hemangiomas. Electronically Signed   By: Kerby Moors M.D.   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  LFT's about normal one month ago.  Mild RUQ abdominal discomfort but really no other complaints.  ? Bile duct stricture vs mass.  MRCP shows interval progression of intrahepatic biliary ductal dilitation without CBD dilation.  CA 19-9 is quite high. 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  Last dose this AM, 11/2, but now has been discontinued in case of EUS/ERCP in near future.  -Dr. Havery Moros to discuss EUS/ERCP with Dr. Ardis Hughs. -Trend LFT's.  Today's levels still pending. -Hold Eliquis for now.   LOS: 1 day   Laban Emperor. Yides Saidi  08/17/2018, 9:13 AM

## 2018-08-18 LAB — COMPREHENSIVE METABOLIC PANEL
ALK PHOS: 601 U/L — AB (ref 38–126)
ALT: 541 U/L — AB (ref 0–44)
AST: 330 U/L — AB (ref 15–41)
Albumin: 4.1 g/dL (ref 3.5–5.0)
Anion gap: 13 (ref 5–15)
BUN: 8 mg/dL (ref 6–20)
CALCIUM: 9.8 mg/dL (ref 8.9–10.3)
CHLORIDE: 98 mmol/L (ref 98–111)
CO2: 27 mmol/L (ref 22–32)
CREATININE: 0.97 mg/dL (ref 0.61–1.24)
GFR calc Af Amer: >60 mL/min (ref >60)
GFR calc non Af Amer: >60 mL/min (ref >60)
Glucose, Bld: 109 mg/dL — ABNORMAL HIGH (ref 70–99)
Potassium: 3.4 mmol/L — ABNORMAL LOW (ref 3.5–5.1)
Sodium: 138 mmol/L (ref 135–145)
Total Bilirubin: 10.1 mg/dL — ABNORMAL HIGH (ref 0.3–1.2)
Total Protein: 8.3 g/dL — ABNORMAL HIGH (ref 6.5–8.1)

## 2018-08-18 NOTE — Progress Notes (Signed)
PROGRESS NOTE    Letcher Schweikert   UKG:[id number]  DOB: 10/09/1991  DOA: 08/16/2018 PCP: [name], MD   Brief Narrative:  Kyle Gonzales is a 27 y.o. 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  Patient presented secondary to an abnormal lab from his hematologist office for routine follow-up.  Patient states that he has had a few days of nausea which is improved with continued vomiting.  He also has some mild right upper quadrant pain.     Subjective: He has no complaints.    Assessment & Plan:   Principal Problem:   PSC (primary sclerosing cholangitis)/   Elevated liver enzymes - MRCP shows advancement of disease- GI to discuss ERCP - cont to follow LFTs - Eliquis being held  Active Problems:     DVT (deep venous thrombosis)   will hold for procedure  HTN - Ziac  Thrush - started Nystatin- thrush still present - follow    Graves disease/  Hypothyroidism - s/p ablation- Synthroid      DVT prophylaxis: stop Eliquis- SCDS  Code Status: Full code Family Communication: mother at bedside Disposition Plan: home when stable Consultants:   GI Procedures:     Antimicrobials:  Anti-infectives (From admission, onward)   Start     Dose/Rate Route Frequency Ordered Stop   08/17/18 1500  ciprofloxacin (CIPRO) IVPB 400 mg     400 mg 200 mL/hr over 60 Minutes Intravenous Every 12 hours 08/17/18 1410         Objective: Vitals:   08/17/18 1524 08/17/18 2140 08/18/18 0429 08/18/18 1517  BP: (!) 123/59 134/81 113/60 114/68  Pulse: 78 (!) 56 (!) 53 74  Resp: 18 18 16 18   Temp: 97.8 F (36.6 C) 98.1 F (36.7 C) (!) 97.3 F (36.3 C)   TempSrc: Oral Oral Oral   SpO2: 100% 100% 100% 100%  Weight:      Height:        Intake/Output Summary (Last 24 hours) at 08/18/2018 1538 Last data filed at  08/18/2018 0800 Gross per 24 hour  Intake 368.43 ml  Output 0 ml  Net 368.43 ml   Filed Weights   08/16/18 1327  Weight: 86.2 kg    Examination: General exam: Appears comfortable  HEENT: PERRLA, oral mucosa moist, no sclera icterus or thrush Respiratory system: Clear to auscultation. Respiratory effort normal. Cardiovascular system: S1 & S2 heard,  No murmurs  Gastrointestinal system: Abdomen soft, non-tender, nondistended. Normal bowel sound. No organomegaly Central nervous system: Alert and oriented. No focal neurological deficits. Extremities: No cyanosis, clubbing or edema Skin: No rashes or ulcers Psychiatry:  Mood & affect appropriate today.   Data Reviewed: I have personally reviewed following labs and imaging studies  CBC: Recent Labs  Lab 08/16/18 1150 08/17/18 0535  WBC 4.1 4.3  NEUTROABS 2.8  --   HGB 12.5* 12.1*  HCT 37.9* 37.4*  MCV 88.6 89.9  PLT 157 762*   Basic Metabolic Panel: Recent Labs  Lab 08/16/18 1150 08/17/18 0535 08/18/18 0829  NA 138 140 138  K 4.4 3.5 3.4*  CL 101 102 98  CO2 27 24 27   GLUCOSE 103* 120* 109*  BUN 9 8 8   CREATININE 1.14 0.98 0.97  CALCIUM 10.1 9.7 9.8   GFR: Estimated Creatinine Clearance: 139.1 mL/min (by C-G formula based on SCr of 0.97 mg/dL). Liver Function Tests: Recent Labs  Lab  08/16/18 1150 08/17/18 0535 08/18/18 0829  AST 303* 306* 330*  ALT 543* 470* 541*  ALKPHOS 750* 592* 601*  BILITOT 10.3* 7.8* 10.1*  PROT 8.3* 7.9 8.3*  ALBUMIN 3.7 4.0 4.1   No results for input(s): LIPASE, AMYLASE in the last 168 hours. No results for input(s): AMMONIA in the last 168 hours. Coagulation Profile: Recent Labs  Lab 08/16/18 1509  INR 0.88   Cardiac Enzymes: No results for input(s): CKTOTAL, CKMB, CKMBINDEX, TROPONINI in the last 168 hours. BNP (last 3 results) No results for input(s): PROBNP in the last 8760 hours. HbA1C: No results for input(s): HGBA1C in the last 72 hours. CBG: No results for  input(s): GLUCAP in the last 168 hours. Lipid Profile: No results for input(s): CHOL, HDL, LDLCALC, TRIG, CHOLHDL, LDLDIRECT in the last 72 hours. Thyroid Function Tests: No results for input(s): TSH, T4TOTAL, FREET4, T3FREE, THYROIDAB in the last 72 hours. Anemia Panel: No results for input(s): VITAMINB12, FOLATE, FERRITIN, TIBC, IRON, RETICCTPCT in the last 72 hours. Urine analysis:    Component Value Date/Time   COLORURINE YELLOW 07/16/2018 1329   APPEARANCEUR CLEAR 07/16/2018 1329   LABSPEC 1.016 07/16/2018 1329   PHURINE 9.0 (H) 07/16/2018 1329   GLUCOSEU NEGATIVE 07/16/2018 1329   HGBUR NEGATIVE 07/16/2018 1329   BILIRUBINUR NEGATIVE 07/16/2018 1329   KETONESUR NEGATIVE 07/16/2018 1329   PROTEINUR NEGATIVE 07/16/2018 1329   NITRITE NEGATIVE 07/16/2018 1329   LEUKOCYTESUR NEGATIVE 07/16/2018 1329   Sepsis Labs: @LABRCNTIP (procalcitonin:4,lacticidven:4) )No results found for this or any previous visit (from the past 240 hour(s)).       Radiology Studies: [name]  Result Date: 08/16/2018 CLINICAL DATA:  Pain and abnormal liver function test. History of sclerosing cholangitis. EXAM: MRI ABDOMEN WITHOUT AND WITH CONTRAST (INCLUDING MRCP) TECHNIQUE: Multiplanar multisequence MR imaging of the abdomen was performed both before and after the administration of intravenous contrast. Heavily T2-weighted images of the biliary and pancreatic ducts were obtained, and three-dimensional MRCP images were rendered by post processing. CONTRAST:  10 cc Gadavist COMPARISON:  MRI 04/26/2018. FINDINGS: Lower chest: No acute findings. Hepatobiliary: There is marked intrahepatic bile duct dilatation which appears progressive compared with previous exam. Normal caliber of the common bile duct. Diffuse gallbladder distension is again noted. Sludge and/or tiny stones stones. Unchanged appearance of hemangiomas within the right lobe of liver, image 33/01/12/2002 and image 52/1304. Pancreas:  No mass, inflammatory changes, or other parenchymal abnormality identified. Spleen:  Within normal limits in size and appearance. Adrenals/Urinary Tract: Normal appearance of the adrenal glands. Kidneys are unremarkable. Stomach/Bowel: Visualized portions within the abdomen are unremarkable. Vascular/Lymphatic: Normal appearance of the abdominal aorta. Considerable motion artifact diminishes mesenteric vascular detail. There is a diminutive superior mesenteric vein. The splenic vein remains patent. 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 No adenopathy identified. Other:  No ascites. Musculoskeletal: No suspicious bone lesions identified. IMPRESSION: 1. There is been interval progression of intrahepatic biliary ductal dilatation without common bile duct dilatation. 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 2. Small amount of gallbladder sludge or tiny stones noted. 3. 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 The superior mesenteric vein appears diminutive. 4. Right lobe of liver hemangiomas. Electronically Signed   By: Kerby Moors M.D.   On: 08/16/2018 22:35   Mr Abdomen Mrcp [name]  Result Date: 08/16/2018 CLINICAL DATA:  Pain and abnormal liver function test. History of sclerosing cholangitis.  EXAM: MRI ABDOMEN WITHOUT AND WITH CONTRAST (INCLUDING MRCP) TECHNIQUE: Multiplanar multisequence MR imaging of the abdomen was performed both before and after the administration of intravenous contrast. Heavily T2-weighted images of the biliary and pancreatic ducts were obtained, and three-dimensional MRCP images were rendered by post processing. CONTRAST:  10 cc Gadavist COMPARISON:  MRI 04/26/2018. FINDINGS: Lower chest: No acute findings. Hepatobiliary: There is marked intrahepatic bile duct dilatation which appears progressive compared with previous exam. Normal caliber of the common bile  duct. Diffuse gallbladder distension is again noted. Sludge and/or tiny stones stones. Unchanged appearance of hemangiomas within the right lobe of liver, image 33/01/12/2002 and image 52/1304. Pancreas: No mass, inflammatory changes, or other parenchymal abnormality identified. Spleen:  Within normal limits in size and appearance. Adrenals/Urinary Tract: Normal appearance of the adrenal glands. Kidneys are unremarkable. Stomach/Bowel: Visualized portions within the abdomen are unremarkable. Vascular/Lymphatic: Normal appearance of the abdominal aorta. Considerable motion artifact diminishes mesenteric vascular detail. There is a diminutive superior mesenteric vein. The splenic vein remains patent. 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 No adenopathy identified. Other:  No ascites. Musculoskeletal: No suspicious bone lesions identified. IMPRESSION: 1. There is been interval progression of intrahepatic biliary ductal dilatation without common bile duct dilatation. 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 2. Small amount of gallbladder sludge or tiny stones noted. 3. 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 The superior mesenteric vein appears diminutive. 4. Right lobe of liver hemangiomas. Electronically Signed   By: Kerby Moors M.D.   On: 08/16/2018 22:35      Scheduled Meds: . bisoprolol-hydrochlorothiazide  1 tablet Oral Daily  . cholecalciferol  5,000 Units Oral QODAY  . levothyroxine  75 mcg Oral Q0600  . nystatin  5 mL Oral QID  . pantoprazole  40 mg Oral Daily   Continuous Infusions: . ciprofloxacin 400 mg (08/18/18 1527)     LOS: 2 days    Time spent in minutes: 35    Debbe Odea, MD [facility] Pager: [email or web address] Password [facility] 08/18/2018, 3:38 PM

## 2018-08-18 NOTE — Progress Notes (Signed)
[facility] Progress Note  CC:  PSC, jaundice  Subjective:  Today's LFT's are pending.  Feels good.  Eating well and walking halls.  No abdominal pain.  Awaiting ERCP tomorrow.  Objective:  Vital signs in last 24 hours: Temp:  [97.3 F (36.3 C)-98.1 F (36.7 C)] 97.3 F (36.3 C) (11/03 0429) Pulse Rate:  [53-78] 53 (11/03 0429) Resp:  [16-18] 16 (11/03 0429) BP: (113-134)/(59-81) 113/60 (11/03 0429) SpO2:  [100 %] 100 % (11/03 0429) Last BM Date: 08/15/18 General:  Alert, Well-developed, in NAD Heart:  Regular rate and rhythm; no murmurs Pulm:  CTAB.  No increased WOB. Abdomen:  Soft, non-distended.  BS present.  Non-tender.   Extremities:  Without edema. Neurologic:  Alert and oriented x 4;  grossly normal neurologically. Psych:  Alert and cooperative. Normal mood and affect.  Intake/Output from previous day: 11/02 0701 - 11/03 0700 In: 488.4 [P.O.:480; IV Piggyback:8.4] Out: 0   Lab Results: Recent Labs    08/16/18 1150 08/17/18 0535  WBC 4.1 4.3  HGB 12.5* 12.1*  HCT 37.9* 37.4*  PLT 157 147*   BMET Recent Labs    08/16/18 1150 08/17/18 0535  NA 138 140  K 4.4 3.5  CL 101 102  CO2 27 24  GLUCOSE 103* 120*  BUN 9 8  CREATININE 1.14 0.98  CALCIUM 10.1 9.7   LFT Recent Labs    08/17/18 0535  PROT 7.9  ALBUMIN 4.0  AST 306*  ALT 470*  ALKPHOS 592*  BILITOT 7.8*  BILIDIR 4.4*  4.4*  IBILI 3.4*   PT/INR Recent Labs    08/16/18 1509  LABPROT 11.9  INR 0.88   Hepatitis Panel Recent Labs    08/16/18 1510  HEPBSAG Negative  HCVAB <0.1  HEPAIGM Negative  HEPBIGM Negative    [name]  Result Date: 08/16/2018 CLINICAL DATA:  Pain and abnormal liver function test. History of sclerosing cholangitis. EXAM: MRI ABDOMEN WITHOUT AND WITH CONTRAST (INCLUDING MRCP) TECHNIQUE: Multiplanar multisequence MR imaging of the abdomen was performed both before and after the administration of intravenous contrast. Heavily  T2-weighted images of the biliary and pancreatic ducts were obtained, and three-dimensional MRCP images were rendered by post processing. CONTRAST:  10 cc Gadavist COMPARISON:  MRI 04/26/2018. FINDINGS: Lower chest: No acute findings. Hepatobiliary: There is marked intrahepatic bile duct dilatation which appears progressive compared with previous exam. Normal caliber of the common bile duct. Diffuse gallbladder distension is again noted. Sludge and/or tiny stones stones. Unchanged appearance of hemangiomas within the right lobe of liver, image 33/01/12/2002 and image 52/1304. Pancreas: No mass, inflammatory changes, or other parenchymal abnormality identified. Spleen:  Within normal limits in size and appearance. Adrenals/Urinary Tract: Normal appearance of the adrenal glands. Kidneys are unremarkable. Stomach/Bowel: Visualized portions within the abdomen are unremarkable. Vascular/Lymphatic: Normal appearance of the abdominal aorta. Considerable motion artifact diminishes mesenteric vascular detail. There is a diminutive superior mesenteric vein. The splenic vein remains patent. 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 No adenopathy identified. Other:  No ascites. Musculoskeletal: No suspicious bone lesions identified. IMPRESSION: 1. There is been interval progression of intrahepatic biliary ductal dilatation without common bile duct dilatation. 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 2. Small amount of gallbladder sludge or tiny stones noted. 3. 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 The superior mesenteric vein appears diminutive. 4. Right lobe of liver hemangiomas. Electronically Signed  By: Kerby Moors M.D.   On: 08/16/2018 22:35   Mr Abdomen Mrcp [name]  Result Date: 08/16/2018 CLINICAL DATA:  Pain and abnormal liver function test. History of sclerosing cholangitis. EXAM: MRI  ABDOMEN WITHOUT AND WITH CONTRAST (INCLUDING MRCP) TECHNIQUE: Multiplanar multisequence MR imaging of the abdomen was performed both before and after the administration of intravenous contrast. Heavily T2-weighted images of the biliary and pancreatic ducts were obtained, and three-dimensional MRCP images were rendered by post processing. CONTRAST:  10 cc Gadavist COMPARISON:  MRI 04/26/2018. FINDINGS: Lower chest: No acute findings. Hepatobiliary: There is marked intrahepatic bile duct dilatation which appears progressive compared with previous exam. Normal caliber of the common bile duct. Diffuse gallbladder distension is again noted. Sludge and/or tiny stones stones. Unchanged appearance of hemangiomas within the right lobe of liver, image 33/01/12/2002 and image 52/1304. Pancreas: No mass, inflammatory changes, or other parenchymal abnormality identified. Spleen:  Within normal limits in size and appearance. Adrenals/Urinary Tract: Normal appearance of the adrenal glands. Kidneys are unremarkable. Stomach/Bowel: Visualized portions within the abdomen are unremarkable. Vascular/Lymphatic: Normal appearance of the abdominal aorta. Considerable motion artifact diminishes mesenteric vascular detail. There is a diminutive superior mesenteric vein. The splenic vein remains patent. 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 No adenopathy identified. Other:  No ascites. Musculoskeletal: No suspicious bone lesions identified. IMPRESSION: 1. There is been interval progression of intrahepatic biliary ductal dilatation without common bile duct dilatation. 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 2. Small amount of gallbladder sludge or tiny stones noted. 3. 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 The superior mesenteric vein appears diminutive. 4. Right lobe of liver hemangiomas. Electronically Signed   By:  Kerby Moors M.D.   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 LFT's about normal one month ago. Mild RUQ abdominal discomfort but really no other complaints. ? Bile duct stricture vs mass.  MRCP shows interval progression of intrahepatic biliary ductal dilitation without CBD dilation.  CA 19-9 is quite high. 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  Last dose this AM, 11/2, but now has been discontinued in case of EUS/ERCP in near future.  -For ERCP with Dr. Ardis Hughs tomorrow. -Trend LFT's.  Today's levels still pending. -Hold Eliquis for now.  Last dose 11/2 AM. -Continue cipro for now.   LOS: 2 days   Kyle Gonzales. Kyle Gonzales  08/18/2018, 8:58 AM

## 2018-08-18 NOTE — Anesthesia Preprocedure Evaluation (Addendum)
Anesthesia Evaluation  Patient identified by MRN, date of birth, ID band Patient awake    Reviewed: Allergy & Precautions, NPO status , Patient's Chart, lab work & pertinent test results  History of Anesthesia Complications Negative for: history of anesthetic complications  Airway Mallampati: II  TM Distance: >3 FB Neck ROM: Full    Dental  (+) Dental Advisory Given, Teeth Intact   Pulmonary Current Smoker,    breath sounds clear to auscultation       Cardiovascular hypertension, Pt. on medications + DVT   Rhythm:Regular Rate:Normal     Neuro/Psych PSYCHIATRIC DISORDERS Depression negative neurological ROS     GI/Hepatic GERD  Medicated and Controlled,(+)     substance abuse  marijuana use,  Primary sclerosing cholangitis    Endo/Other  Hypothyroidism  Graves disease   Renal/GU negative Renal ROS     Musculoskeletal negative musculoskeletal ROS (+)   Abdominal   Peds  Hematology  (+) anemia ,   Anesthesia Other Findings   Reproductive/Obstetrics                            Anesthesia Physical Anesthesia Plan  ASA: II  Anesthesia Plan: General   Post-op Pain Management:    Induction: Intravenous  PONV Risk Score and Plan: 2 and Treatment may vary due to age or medical condition, Ondansetron, Midazolam and Dexamethasone  Airway Management Planned: Oral ETT  Additional Equipment: None  Intra-op Plan:   Post-operative Plan: Extubation in OR  Informed Consent: I have reviewed the patients History and Physical, chart, labs and discussed the procedure including the risks, benefits and alternatives for the proposed anesthesia with the patient or authorized representative who has indicated his/her understanding and acceptance.   Dental advisory given  Plan Discussed with: CRNA and Anesthesiologist  Anesthesia Plan Comments:        Anesthesia Quick Evaluation

## 2018-08-19 ENCOUNTER — Inpatient Hospital Stay (HOSPITAL_COMMUNITY): Payer: 59 | Admitting: Anesthesiology

## 2018-08-19 ENCOUNTER — Encounter (HOSPITAL_COMMUNITY)
Admission: EM | Disposition: A | Discharge status: Home / Self Care | Payer: Self-pay | Source: Home / Self Care | Attending: Internal Medicine

## 2018-08-19 ENCOUNTER — Inpatient Hospital Stay (HOSPITAL_COMMUNITY): Payer: 59

## 2018-08-19 ENCOUNTER — Encounter (HOSPITAL_COMMUNITY): Payer: Self-pay | Admitting: Certified Registered Nurse Anesthetist

## 2018-08-19 DIAGNOSIS — K831 Obstruction of bile duct: Secondary | ICD-10-CM

## 2018-08-19 DIAGNOSIS — K7200 Acute and subacute hepatic failure without coma: Secondary | ICD-10-CM

## 2018-08-19 HISTORY — PX: ERCP: SHX5425

## 2018-08-19 HISTORY — PX: SPHINCTEROTOMY: SHX5544

## 2018-08-19 HISTORY — PX: REMOVAL OF STONES: SHX5545

## 2018-08-19 LAB — COMPREHENSIVE METABOLIC PANEL
ALT: 528 U/L — AB (ref 0–44)
ANION GAP: 12 (ref 5–15)
AST: 338 U/L — ABNORMAL HIGH (ref 15–41)
Albumin: 3.6 g/dL (ref 3.5–5.0)
Alkaline Phosphatase: 546 U/L — ABNORMAL HIGH (ref 38–126)
BUN: 7 mg/dL (ref 6–20)
CHLORIDE: 100 mmol/L (ref 98–111)
CO2: 25 mmol/L (ref 22–32)
Calcium: 9.6 mg/dL (ref 8.9–10.3)
Creatinine, Ser: 0.77 mg/dL (ref 0.61–1.24)
GFR calc non Af Amer: >60 mL/min (ref >60)
Glucose, Bld: 111 mg/dL — ABNORMAL HIGH (ref 70–99)
POTASSIUM: 3.5 mmol/L (ref 3.5–5.1)
SODIUM: 137 mmol/L (ref 135–145)
Total Bilirubin: 11.7 mg/dL — ABNORMAL HIGH (ref 0.3–1.2)
Total Protein: 7.6 g/dL (ref 6.5–8.1)

## 2018-08-19 LAB — LUPUS ANTICOAGULANT PANEL
DRVVT: 38.8 s (ref 0.0–47.0)
PTT Lupus Anticoagulant: 38.3 s (ref 0.0–51.9)

## 2018-08-19 LAB — PROTEIN S, TOTAL AND FREE
PROTEIN S AG TOTAL: 63 % (ref 60–150)
Protein S Ag, Free: 30 % — ABNORMAL LOW (ref 57–157)

## 2018-08-19 SURGERY — ERCP, WITH INTERVENTION IF INDICATED
Anesthesia: General

## 2018-08-19 MED ORDER — SUGAMMADEX SODIUM 500 MG/5ML IV SOLN
INTRAVENOUS | Status: DC | PRN
  Administered 2018-08-19: 300 mg via INTRAVENOUS

## 2018-08-19 MED ORDER — LIDOCAINE HCL (CARDIAC) PF 100 MG/5ML IV SOSY
PREFILLED_SYRINGE | INTRAVENOUS | Status: DC | PRN
  Administered 2018-08-19: 50 mg via INTRAVENOUS

## 2018-08-19 MED ORDER — DEXAMETHASONE SODIUM PHOSPHATE 10 MG/ML IJ SOLN
INTRAMUSCULAR | Status: DC | PRN
  Administered 2018-08-19: 10 mg via INTRAVENOUS

## 2018-08-19 MED ORDER — INDOMETHACIN 50 MG RE SUPP
RECTAL | Status: DC | PRN
  Administered 2018-08-19: 100 mg via RECTAL

## 2018-08-19 MED ORDER — ROCURONIUM BROMIDE 100 MG/10ML IV SOLN
INTRAVENOUS | Status: DC | PRN
  Administered 2018-08-19: 20 mg via INTRAVENOUS
  Administered 2018-08-19: 60 mg via INTRAVENOUS

## 2018-08-19 MED ORDER — INDOMETHACIN 50 MG RE SUPP
RECTAL | Status: AC
  Filled 2018-08-19: qty 2

## 2018-08-19 MED ORDER — LACTATED RINGERS IV SOLN
INTRAVENOUS | Status: DC | PRN
  Administered 2018-08-19 (×2): via INTRAVENOUS

## 2018-08-19 MED ORDER — PROMETHAZINE HCL 25 MG/ML IJ SOLN: 6.2500 mg | INTRAMUSCULAR | Status: DC | PRN

## 2018-08-19 MED ORDER — SODIUM CHLORIDE 0.9 % IV SOLN
INTRAVENOUS | Status: DC | PRN
  Administered 2018-08-19: 30 mL

## 2018-08-19 MED ORDER — GLUCAGON HCL RDNA (DIAGNOSTIC) 1 MG IJ SOLR
INTRAMUSCULAR | Status: AC
  Filled 2018-08-19: qty 1

## 2018-08-19 MED ORDER — PHENYLEPHRINE HCL 10 MG/ML IJ SOLN
INTRAMUSCULAR | Status: DC | PRN
  Administered 2018-08-19 (×2): 40 ug via INTRAVENOUS

## 2018-08-19 MED ORDER — PROPOFOL 10 MG/ML IV BOLUS
INTRAVENOUS | Status: AC
  Filled 2018-08-19: qty 20

## 2018-08-19 MED ORDER — PROPOFOL 10 MG/ML IV BOLUS
INTRAVENOUS | Status: DC | PRN
  Administered 2018-08-19: 225 mg via INTRAVENOUS

## 2018-08-19 MED ORDER — FENTANYL CITRATE (PF) 100 MCG/2ML IJ SOLN
INTRAMUSCULAR | Status: AC
  Filled 2018-08-19: qty 2

## 2018-08-19 MED ORDER — FENTANYL CITRATE (PF) 100 MCG/2ML IJ SOLN
INTRAMUSCULAR | Status: DC | PRN
  Administered 2018-08-19: 100 ug via INTRAVENOUS

## 2018-08-19 MED ORDER — ONDANSETRON HCL 4 MG/2ML IJ SOLN
INTRAMUSCULAR | Status: DC | PRN
  Administered 2018-08-19: 4 mg via INTRAVENOUS

## 2018-08-19 NOTE — Op Note (Signed)
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 Estimated blood loss:                            None Estimated Blood Loss:     Estimated blood loss: none. Procedure:                Pre-Anesthesia Assessment:                           - Prior to the procedure, a History and Physical                            was performed, and patient medications and                            allergies were reviewed. The patient's tolerance of                            previous anesthesia was also reviewed. The risks                            and benefits of the procedure and the sedation                            options and risks were discussed with the patient.                            All questions were answered, and informed consent  was obtained. Prior Anticoagulants: The patient has                            taken Eliquis (apixaban), last dose was 4 days                            prior to procedure. ASA Grade Assessment: II - A                            patient with mild systemic disease. After reviewing                            the risks and benefits, the patient was deemed in            satisfactory condition to undergo the procedure.                           After obtaining informed consent, the scope was                            passed under direct vision. Throughout the                            procedure, the patient's blood pressure, pulse, and                            oxygen saturations were monitored continuously. The                            TJF-Q180V ([id number] Olympus ERCP was introduced                            through the mouth, and used to inject contrast into                            and used to inject contrast into the bile duct. The                            ERCP was accomplished without difficulty. The                            patient tolerated the procedure well. Scope In: Scope Out: Findings:      Scout film was normal. The dudoenoscope was advanced to the region of       the major papilla without detailed examination of the UGI tract. He       needed to be temporarily repositioned in left lateral decub body       position to advance the scout out of the stomach. The major papilla was       normal. A 44 Autotome over a .035 hydrajag was used to cannulate the       bile duct and contrast was injected. Cholangiogram revealed a       non-dilated CBD/CHD. The cytic duct partially opacified. The left and       right  main bile ducts were dilated but I could not detect a proximal       biliary stricture. Rather, there was a focal, 6-25mm long mid bile duct       stricture visible on cholangiogram and the biliary retreival balloon       repeated 'hung' at the site. I performed a biliary sphincterotomy and       swept the duct several times. Copious biliary sludge and several small       black stone fragments were delivered into the duodenum. No large stone       particles were seen. I sampled the focal mid bile duct stricture with a       cytology brush and then dilated the stricture using a 53mm diamter, 4cm       long Hurricane biliary  dilating balloon. After dilation I swept the duct       several more times, continuing to deliver copious sludge, debris into       the duodenum. Completion, occlusion cholangiogram showed no persistent       stricture and no filling defects. The pancreatic duct was never injected       with dye or cannulated with the balloon. Impression:               - Non-dilated CBD, CHD. Dilated right and left main                            hepatic ducts without evident stricture.                           - Biliary sphincteortomy performed.                           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                           - Copious biliary sludge/debris as well as several                            small but very clear black stone fragments were                            delivered into the duodenum. Moderate Sedation:      Not Applicable - Patient had care per Anesthesia. Recommendation:           - Return patient to hospital ward for ongoing care.                           - Follow clinically, serial LFTs.                           - Continue cipro daily for another 5 days.                           -  General surgical evaluation to consider elective                            lap chole (clearly had sludge, stone fragments                            swept from his bile duct).                           - Await cytology results from mid-CBD stricture                            (PSC related?, neoplastic?, from chronic stone                            irritation?, extrinsic from adjacent abnormal                            portal vessel?) Procedure Code(s):        --- Professional ---                           [phone], 67, Endoscopic retrograde                            cholangiopancreatography (ERCP); with                            trans-endoscopic  balloon dilation of                            biliary/pancreatic duct(s) or of ampulla                            (sphincteroplasty), including sphincterotomy, when                            performed, each duct                           [id number], Endoscopic retrograde                            cholangiopancreatography (ERCP); with removal of                            calculi/debris from biliary/pancreatic duct(s) Diagnosis Code(s):        --- Professional ---                           K80.50, Calculus of bile duct without cholangitis                            or cholecystitis without obstruction                           R17, Unspecified jaundice CPT copyright 2018 [employer]. All  rights reserved. The codes documented in this report are preliminary and upon coder review may  be revised to meet current compliance requirements. Milus Banister, MD 08/19/2018 12:55:17 PM This report has been signed electronically. Number of Addenda: 0

## 2018-08-19 NOTE — Interval H&P Note (Signed)
History and Physical Interval Note:  08/19/2018 10:26 AM  Kyle Gonzales  has presented today for surgery, with the diagnosis of abnormal MRCP - biliary obstruction  The various methods of treatment have been discussed with the patient and family. After consideration of risks, benefits and other options for treatment, the patient has consented to  Procedure(s): ENDOSCOPIC RETROGRADE CHOLANGIOPANCREATOGRAPHY (ERCP) (N/A) as a surgical intervention .  The patient's history has been reviewed, patient examined, no change in status, stable for surgery.  I have reviewed the patient's chart and labs.  Questions were answered to the patient's satisfaction.     Milus Banister

## 2018-08-19 NOTE — Transfer of Care (Signed)
Immediate Anesthesia Transfer of Care Note  Patient: Kyle Gonzales  Procedure(s) Performed: ENDOSCOPIC RETROGRADE CHOLANGIOPANCREATOGRAPHY (ERCP) (N/A )  Patient Location: PACU and Endoscopy Unit  Anesthesia Type:General  Level of Consciousness: awake, oriented, drowsy and patient cooperative  Airway & Oxygen Therapy: Patient Spontanous Breathing and Patient connected to face mask oxygen  Post-op Assessment: Report given to RN, Post -op Vital signs reviewed and stable and Patient moving all extremities X 4  Post vital signs: Reviewed and stable  Last Vitals:  Vitals Value Taken Time  BP 157/69 08/19/2018  1:10 PM  Temp    Pulse 74 08/19/2018  1:12 PM  Resp 10 08/19/2018  1:12 PM  SpO2 100 % 08/19/2018  1:12 PM  Vitals shown include unvalidated device data.  Last Pain:  Vitals:   08/19/18 1032  TempSrc: Oral  PainSc: 0-No pain         Complications: No apparent anesthesia complications

## 2018-08-19 NOTE — Anesthesia Postprocedure Evaluation (Signed)
Anesthesia Post Note  Patient: Kyle Gonzales  Procedure(s) Performed: ENDOSCOPIC RETROGRADE CHOLANGIOPANCREATOGRAPHY (ERCP) (N/A ) SPHINCTEROTOMY REMOVAL OF STONES BILIARY BRUSHING BILIARY DILATION     Patient location during evaluation: PACU Anesthesia Type: General Level of consciousness: awake and alert Pain management: pain level controlled Vital Signs Assessment: post-procedure vital signs reviewed and stable Respiratory status: spontaneous breathing, nonlabored ventilation and respiratory function stable Cardiovascular status: blood pressure returned to baseline and stable Postop Assessment: no apparent nausea or vomiting Anesthetic complications: no    Last Vitals:  Vitals:   08/19/18 1351 08/19/18 1440  BP: (!) 154/98 122/76  Pulse: 63   Resp: 18   Temp: 36.7 C   SpO2: 100%     Last Pain:  Vitals:   08/19/18 1453  TempSrc:   PainSc: Asleep                 Lidia Collum

## 2018-08-19 NOTE — Anesthesia Procedure Notes (Addendum)
Procedure Name: Intubation Date/Time: 08/19/2018 11:30 AM Performed by: Audry Pili, MD Pre-anesthesia Checklist: Patient identified, Emergency Drugs available, Suction available, Timeout performed and Patient being monitored Patient Re-evaluated:Patient Re-evaluated prior to induction Oxygen Delivery Method: Circle system utilized Preoxygenation: Pre-oxygenation with 100% oxygen Induction Type: IV induction Ventilation: Mask ventilation without difficulty Laryngoscope Size: Miller and 3 Grade View: Grade I Tube type: Oral Tube size: 7.5 mm Number of attempts: 2 (First look by CRNA with Mac 3, grade 3 view. 2nd view by Dr. Fransisco Beau with Sabra Heck 3, grade 1 view.) Airway Equipment and Method: Stylet Secured at: 22.5 cm Tube secured with: Tape Dental Injury: Teeth and Oropharynx as per pre-operative assessment

## 2018-08-19 NOTE — Progress Notes (Signed)
BP= 154/98. Patient has history of HTN. Medication was given as MD ordered. RN will recheck BP in 30 minutes.

## 2018-08-19 NOTE — Progress Notes (Signed)
PROGRESS NOTE    Kyle Gonzales   JIR:[id number]  DOB: [birth year]  DOA: 08/16/2018 PCP: [name], MD   Brief Narrative:  [name] is a 27 y.o. 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  Patient presented secondary to an abnormal lab from his hematologist office for routine follow-up.  Patient states that he has had a few days of nausea which is improved with continued vomiting.  He also has some mild right upper quadrant pain.     Subjective: Evaluated prior to ERCP He has no complaints.    Assessment & Plan:   Principal Problem:   PSC (primary sclerosing cholangitis)/   Elevated liver enzymes - MRCP shows advancement of disease- GI to discuss ERCP - cont to follow LFTs - Eliquis being held - sphincterotomy done and sludge removed  Active Problems:     DVT (deep venous thrombosis) - holding Elilquis for procedure  HTN - Ziac  Thrush - started Nystatin- thrush still present - he tells me that he is not swishing his mouth with the Nystatin- I have explained how to properly use it- follow    Graves disease/  Hypothyroidism - s/p ablation- Synthroid      DVT prophylaxis: stop Eliquis- SCDS  Code Status: Full code Family Communication: mother at bedside Disposition Plan: home when stable Consultants:   GI Procedures:   ERCP:Impression:               - Non-dilated CBD, CHD. Dilated right and left main                            hepatic ducts without evident stricture.                           - Biliary sphincteortomy performed.                           - Focal, 6-58mm long mid CBD stricture that was                            brushed for cytology and then dilated to 13mm with a                            Hurricane dilating balloon.                           - Copious biliary  sludge/debris as well as several                            small but very clear black stone fragments were                             delivered into the duodenum. Antimicrobials:  Anti-infectives (From admission, onward)   Start     Dose/Rate Route Frequency Ordered Stop   08/17/18 1500  ciprofloxacin (CIPRO) IVPB 400 mg     400 mg 200 mL/hr over 60 Minutes Intravenous Every 12 hours 08/17/18 1410         Objective: Vitals:   08/19/18  1320 08/19/18 1330 08/19/18 1351 08/19/18 1440  BP: (!) 159/87 (!) 169/85 (!) 154/98 122/76  Pulse: 77 73 63   Resp: 11 14 18    Temp:   98 F (36.7 C)   TempSrc:      SpO2: 100% 98% 100%   Weight:      Height:        Intake/Output Summary (Last 24 hours) at 08/19/2018 1744 Last data filed at 08/19/2018 1312 Gross per 24 hour  Intake 1170 ml  Output -  Net 1170 ml   Filed Weights   08/16/18 1327  Weight: 86.2 kg    Examination: General exam: Appears comfortable  HEENT: PERRLA, oral mucosa moist, no sclera icterus or thrush Respiratory system: Clear to auscultation. Respiratory effort normal. Cardiovascular system: S1 & S2 heard,  No murmurs  Gastrointestinal system: Abdomen soft, non-tender, nondistended. Normal bowel sound. No organomegaly Central nervous system: Alert and oriented. No focal neurological deficits. Extremities: No cyanosis, clubbing or edema Skin: No rashes or ulcers Psychiatry:  Mood & affect appropriate.   Data Reviewed: I have personally reviewed following labs and imaging studies  CBC: Recent Labs  Lab 08/16/18 1150 08/17/18 0535  WBC 4.1 4.3  NEUTROABS 2.8  --   HGB 12.5* 12.1*  HCT 37.9* 37.4*  MCV 88.6 89.9  PLT 157 761*   Basic Metabolic Panel: Recent Labs  Lab 08/16/18 1150 08/17/18 0535 08/18/18 0829 08/19/18 0544  NA 138 140 138 137  K 4.4 3.5 3.4* 3.5  CL 101 102 98 100  CO2 27 24 27 25   GLUCOSE 103* 120* 109* 111*  BUN 9 8 8 7   CREATININE 1.14 0.98 0.97 0.77  CALCIUM 10.1 9.7 9.8  9.6   GFR: Estimated Creatinine Clearance: 168.7 mL/min (by C-G formula based on SCr of 0.77 mg/dL). Liver Function Tests: Recent Labs  Lab 08/16/18 1150 08/17/18 0535 08/18/18 0829 08/19/18 0544  AST 303* 306* 330* 338*  ALT 543* 470* 541* 528*  ALKPHOS 750* 592* 601* 546*  BILITOT 10.3* 7.8* 10.1* 11.7*  PROT 8.3* 7.9 8.3* 7.6  ALBUMIN 3.7 4.0 4.1 3.6   No results for input(s): LIPASE, AMYLASE in the last 168 hours. No results for input(s): AMMONIA in the last 168 hours. Coagulation Profile: Recent Labs  Lab 08/16/18 1509  INR 0.88   Cardiac Enzymes: No results for input(s): CKTOTAL, CKMB, CKMBINDEX, TROPONINI in the last 168 hours. BNP (last 3 results) No results for input(s): PROBNP in the last 8760 hours. HbA1C: No results for input(s): HGBA1C in the last 72 hours. CBG: No results for input(s): GLUCAP in the last 168 hours. Lipid Profile: No results for input(s): CHOL, HDL, LDLCALC, TRIG, CHOLHDL, LDLDIRECT in the last 72 hours. Thyroid Function Tests: No results for input(s): TSH, T4TOTAL, FREET4, T3FREE, THYROIDAB in the last 72 hours. Anemia Panel: No results for input(s): VITAMINB12, FOLATE, FERRITIN, TIBC, IRON, RETICCTPCT in the last 72 hours. Urine analysis:    Component Value Date/Time   COLORURINE YELLOW 07/16/2018 1329   APPEARANCEUR CLEAR 07/16/2018 1329   LABSPEC 1.016 07/16/2018 1329   PHURINE 9.0 (H) 07/16/2018 1329   GLUCOSEU NEGATIVE 07/16/2018 1329   HGBUR NEGATIVE 07/16/2018 1329   BILIRUBINUR NEGATIVE 07/16/2018 1329   KETONESUR NEGATIVE 07/16/2018 1329   PROTEINUR NEGATIVE 07/16/2018 1329   NITRITE NEGATIVE 07/16/2018 1329   LEUKOCYTESUR NEGATIVE 07/16/2018 1329   Sepsis Labs: @LABRCNTIP (procalcitonin:4,lacticidven:4) )No results found for this or any previous visit (from the past 240 hour(s)).  Radiology Studies: Dg Ercp  Result Date: 08/19/2018 CLINICAL DATA:  Biliary obstruction EXAM: ERCP TECHNIQUE: Multiple spot  images obtained with the fluoroscopic device and submitted for interpretation post-procedure. FLUOROSCOPY TIME:  Fluoroscopy Time:  7 minutes and 23 seconds Radiation Exposure Index (if provided by the fluoroscopic device): Number of Acquired Spot Images: 0 COMPARISON:  None. FINDINGS: Images demonstrate cannulation of the common bile duct and contrast filling the biliary tree. Biliary balloon dilatation is documented. IMPRESSION: See above. These images were submitted for radiologic interpretation only. Please see the procedural report for the amount of contrast and the fluoroscopy time utilized. Electronically Signed   By: Marybelle Killings M.D.   On: 08/19/2018 12:55      Scheduled Meds: . bisoprolol-hydrochlorothiazide  1 tablet Oral Daily  . cholecalciferol  5,000 Units Oral QODAY  . levothyroxine  75 mcg Oral Q0600  . nystatin  5 mL Oral QID  . pantoprazole  40 mg Oral Daily   Continuous Infusions: . ciprofloxacin 400 mg (08/19/18 1405)     LOS: 3 days    Time spent in minutes: 35    Debbe Odea, MD [facility] Pager: [email or web address] Password [facility] 08/19/2018, 5:44 PM

## 2018-08-20 ENCOUNTER — Telehealth: Payer: Self-pay | Admitting: Hematology

## 2018-08-20 ENCOUNTER — Other Ambulatory Visit: Payer: Self-pay | Admitting: Internal Medicine

## 2018-08-20 LAB — COMPREHENSIVE METABOLIC PANEL
ALK PHOS: 528 U/L — AB (ref 38–126)
ALT: 596 U/L — ABNORMAL HIGH (ref 0–44)
ANION GAP: 13 (ref 5–15)
AST: 367 U/L — ABNORMAL HIGH (ref 15–41)
Albumin: 3.6 g/dL (ref 3.5–5.0)
BUN: 10 mg/dL (ref 6–20)
CALCIUM: 9.5 mg/dL (ref 8.9–10.3)
CHLORIDE: 100 mmol/L (ref 98–111)
CO2: 23 mmol/L (ref 22–32)
Creatinine, Ser: 0.81 mg/dL (ref 0.61–1.24)
Glucose, Bld: 116 mg/dL — ABNORMAL HIGH (ref 70–99)
Potassium: 3.5 mmol/L (ref 3.5–5.1)
SODIUM: 136 mmol/L (ref 135–145)
Total Bilirubin: 12 mg/dL — ABNORMAL HIGH (ref 0.3–1.2)
Total Protein: 7.9 g/dL (ref 6.5–8.1)

## 2018-08-20 LAB — APTT: APTT: 28 s (ref 24–36)

## 2018-08-20 LAB — HEPARIN LEVEL (UNFRACTIONATED)

## 2018-08-20 LAB — PROTEIN S ACTIVITY: Protein S Activity: 31 % — ABNORMAL LOW (ref 63–140)

## 2018-08-20 MED ORDER — DIPHENHYDRAMINE HCL 25 MG PO CAPS
25.0000 mg | ORAL_CAPSULE | Freq: Once | ORAL | Status: AC
  Administered 2018-08-20: 25 mg via ORAL
  Filled 2018-08-20: qty 1

## 2018-08-20 MED ORDER — HEPARIN (PORCINE) IN NACL 100-0.45 UNIT/ML-% IJ SOLN
1600.0000 [IU]/h | INTRAMUSCULAR | Status: DC
  Administered 2018-08-20: 1400 [IU]/h via INTRAVENOUS
  Filled 2018-08-20 (×2): qty 250

## 2018-08-20 NOTE — Progress Notes (Signed)
Patient ID: Kyle Gonzales, male   DOB: 1991-06-26, 26 y.o.   MRN: [id number]    Progress Note   Subjective    Feels better today - says was very anxious yesterday . Abdominal pain has resolved - not eating yet , yes not abnormal for him , may only eat a couple times per day  No fever    Objective   Vital signs in last 24 hours: Temp:  [97.5 F (36.4 C)-98.2 F (36.8 C)] 98.2 F (36.8 C) (11/05 0333) Pulse Rate:  [62-82] 62 (11/05 0333) Resp:  [11-19] 16 (11/05 0333) BP: (122-169)/(69-98) 132/71 (11/05 0333) SpO2:  [98 %-100 %] 100 % (11/05 0333) Last BM Date: 08/15/18 General:   AA male in NAD,  scleral icterus Heart:  Regular rate and rhythm; no murmurs Lungs: Respirations even and unlabored, lungs CTA bilaterally Abdomen:  Soft, nontender and nondistended. Normal bowel sounds. Extremities:  Without edema. Neurologic:  Alert and oriented,  grossly normal neurologically. Psych:  Cooperative. Normal mood and affect.  Intake/Output from previous day: 11/04 0701 - 11/05 0700 In: 1050 [I.V.:1050] Out: -  Intake/Output this shift: Total I/O In: 240 [P.O.:240] Out: -   Lab Results: No results for input(s): WBC, HGB, HCT, PLT in the last 72 hours. BMET Recent Labs    08/18/18 0829 08/19/18 0544 08/20/18 0603  NA 138 137 136  K 3.4* 3.5 3.5  CL 98 100 100  CO2 27 25 23   GLUCOSE 109* 111* 116*  BUN 8 7 10   CREATININE 0.97 0.77 0.81  CALCIUM 9.8 9.6 9.5   LFT Recent Labs    08/20/18 0603  PROT 7.9  ALBUMIN 3.6  AST 367*  ALT 596*  ALKPHOS 528*  BILITOT 12.0*   PT/INR No results for input(s): LABPROT, INR in the last 72 hours.  Studies/Results: Dg Ercp  Result Date: 08/19/2018 CLINICAL DATA:  Biliary obstruction EXAM: ERCP TECHNIQUE: Multiple spot images obtained with the fluoroscopic device and submitted for interpretation post-procedure. FLUOROSCOPY TIME:  Fluoroscopy Time:  7 minutes and 23 seconds Radiation Exposure Index (if provided by the  fluoroscopic device): Number of Acquired Spot Images: 0 COMPARISON:  None. FINDINGS: Images demonstrate cannulation of the common bile duct and contrast filling the biliary tree. Biliary balloon dilatation is documented. IMPRESSION: See above. These images were submitted for radiologic interpretation only. Please see the procedural report for the amount of contrast and the fluoroscopy time utilized. Electronically Signed   By: Marybelle Killings M.D.   On: 08/19/2018 12:55       Assessment / Plan:     #12  27 year old African-American male with small duct PSC, without cirrhosis who was admitted on 08/16/2018 with jaundice, mild upper abdominal pain and nausea. Patient has prior diagnosis of chronic SMV thrombosis, and has been on Eliquis.  MRI/MRCP this admission showed interval progression of intrahepatic biliary ductal dilation, without common bile duct dilation and small amount of gallbladder sludge versus tiny stones.  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  Patient underwent ERCP yesterday with finding of a nondilated common bile duct and common hepatic duct, and dilated right and left intrahepatic ducts.  There was a focal mid common duct stricture which was brushed for cytology and balloon dilated.  Patient is symptomatically improved and his abdominal pain and nausea have resolved.  Bilirubin did bump today which is not unexpected postprocedure  Brushings are pending   Plan; Continue IV antibiotics Regular diet as  tolerated Follow up Brushings We have asked surgery to see him for elective laparoscopic cholecystectomy due to finding of sludge/small stones in the gallbladder.  This does not have to be done this admission but would like him to be scheduled in the near future.  Patient needs to stay in the hospital until LFTs are trending down. Thank you we will follow with you     Contact  Xinyi Batton Ajo, P.A.-C                [phone]      Principal Problem:   PSC (primary sclerosing cholangitis) Active Problems:   Graves disease   Hypothyroidism   Hyperlipidemia, mixed   MDD (major depressive disorder), recurrent severe, without psychosis ([place])   Hypertension   DVT (deep venous thrombosis) (HCC)   Abnormal liver enzymes   Abnormal magnetic resonance cholangiopancreatography (MRCP)   Bile duct obstruction     LOS: 4 days   Calen Posch  08/20/2018, 11:02 AM

## 2018-08-20 NOTE — Telephone Encounter (Signed)
Bartholomew OUT 11/8 - called patient re changing f/u appointment. Spoke with patient's mother and per mother patient is in hospital. Also per Mother scheduled appointment for 11/29 due to she is odd on Fridays.

## 2018-08-20 NOTE — Consult Note (Addendum)
[facility] Surgery Consult Note  Kyle Gonzales 08/22/1991  [id number].    Requesting MD: Owens Loffler, MD  Chief Complaint/Reason for Consult: cholelithiasis  HPI:  Kyle Gonzales is a pleasant 27 year old African-American male with a past medical history of [place] without cirrhosis, chronic SMV thrombosis, hypothyroidism, and depression who presented to the hospital on 08/16/2018 after outpatient blood work revealed increasing LFTs with a total bilirubin of 10.3.  Patient reports right upper quadrant pain described as dull and constant for between 2-4 weeks.  At baseline the patient endorses intermittent epigastric pain due to GERD that occurs after eating certain foods.  He reports a chronic history of intermittent nausea and vomiting, present prior to this hospital admission.  He reports having loose brown stools daily, denies diarrhea or constipation. He denies fever, chills, shortness of breath.  Reports smoking marijuana 2-3 times daily since he was 27 years old.  Also reports smoking cigars.  Patient is currently unemployed and lives with his mother.  The patient was admitted for further work-up and underwent MRCP showing intrahepatic biliary ductal dilation, normal caliber common bile duct, small amount of gallbladder sludge and stones, and cavernous transformation of the portal vein.  The patient underwent ERCP which was significant for a 6 to 8 mm mid common bile duct stricture with sludge and small stone fragments proximally.  Cytology was taken and the stricture was dilated.  General surgery has been asked to evaluate the patient and give recommendations regarding potential need for laparoscopic cholecystectomy.  ROS: Review of Systems  Constitutional: Negative for chills and fever.  Gastrointestinal: Positive for abdominal pain, nausea and vomiting.  Genitourinary: Negative.   All other systems reviewed and are negative.   Family History  Problem Relation Age of Onset  . Diabetes  Mother   . Colon cancer Maternal Grandmother   . Stomach cancer Maternal Grandmother   . Diabetes Maternal Grandmother   . Stomach cancer Maternal Grandfather   . Diabetes Maternal Grandfather     Past Medical History:  Diagnosis Date  . GERD (gastroesophageal reflux disease)   . Graves disease 08/18/2015  . Hyperlipidemia   . Hypothyroid   . Palpitations   . PSC (primary sclerosing cholangitis)   . Vitamin D deficiency     Past Surgical History:  Procedure Laterality Date  . ANTERIOR CRUCIATE LIGAMENT REPAIR  2012  . WISDOM TOOTH EXTRACTION      Social History:  reports that he has been smoking. He has never used smokeless tobacco. He reports that he has current or past drug history. Drug: Marijuana. He reports that he does not drink alcohol.  Allergies:  Allergies  Allergen Reactions  . Tylenol [Acetaminophen] Other (See Comments)    r/t elevated LFTs     Medications Prior to Admission  Medication Sig Dispense Refill  . alum & mag hydroxide-simeth (MAALOX ADVANCED MAX ST) 400-400-40 MG/5ML suspension Take 15 mLs by mouth every 6 (six) hours as needed for indigestion. 355 mL 0  . apixaban (ELIQUIS) 5 MG TABS tablet Take 1 tablet (5 mg total) by mouth 2 (two) times daily. 60 tablet 2  . bisoprolol-hydrochlorothiazide (ZIAC) 10-6.25 MG tablet Take 1 tablet by mouth daily. 90 tablet 1  . Cholecalciferol (VITAMIN D3) 5000 units CAPS Take 1 capsule (5,000 Units total) by mouth every other day.    . ezetimibe (ZETIA) 10 MG tablet TAKE 1 TABLET BY MOUTH ONCE DAILY FOR CHOLESTEROL (Patient taking differently: Take 10 mg by mouth daily. ) 90 tablet 0  .  hydrOXYzine (ATARAX/VISTARIL) 25 MG tablet Take 1 tablet (25 mg total) by mouth 3 (three) times daily as needed for anxiety. 30 tablet 0  . ibuprofen (ADVIL,MOTRIN) 200 MG tablet Take 600 mg by mouth every 6 (six) hours as needed (For stomach pain.).    Marland Kitchen levothyroxine (SYNTHROID, LEVOTHROID) 75 MCG tablet take 1 tablet by mouth  every morning ON AN EMPTY STOMACH, take 1.5 tabs one day a week on Mondays. (Patient taking differently: Take 75 mcg by mouth daily. ) 90 tablet 1  . lidocaine (XYLOCAINE) 2 % solution Use as directed 15 mLs in the mouth or throat every 6 (six) hours as needed for mouth pain. 100 mL 0  . omeprazole (PRILOSEC) 40 MG capsule Take 1 capsule (40 mg total) by mouth daily before breakfast. (Patient taking differently: Take 40 mg by mouth daily as needed (heartburn). ) 30 capsule 6  . ondansetron (ZOFRAN) 8 MG tablet Take 1 tablet 3 x/ day before meals - Nausea (Patient taking differently: Take 8 mg by mouth 3 (three) times daily as needed for nausea or vomiting. ) 60 tablet 0  . sucralfate (CARAFATE) 1 g tablet Take 1 tablet (1 g total) by mouth 2 (two) times daily. (Patient taking differently: Take 1 g by mouth 2 (two) times daily as needed (For stomach.). ) 60 tablet 1  . sucralfate (CARAFATE) 1 GM/10ML suspension Take 1 g by mouth daily as needed (stomach pain).      Blood pressure 132/71, pulse 62, temperature 98.2 F (36.8 C), temperature source Oral, resp. rate 16, height _0  (1.905 m), weight 86.2 kg, SpO2 100 %. Physical Exam: Physical Exam  Constitutional: He is oriented to person, place, and time. He appears well-developed and well-nourished.  Non-toxic appearance. He does not appear ill.  HENT:  Head: Normocephalic and atraumatic.  Mouth/Throat: No oropharyngeal exudate.  Eyes: Pupils are equal, round, and reactive to light. EOM are normal. Scleral icterus is present.  Cardiovascular: Normal rate, regular rhythm, normal heart sounds and intact distal pulses. Exam reveals no friction rub.  No murmur heard. Pulmonary/Chest: Effort normal and breath sounds normal. No stridor. No respiratory distress. He has no wheezes. He has no rales.  Abdominal: Soft. Normal appearance and bowel sounds are normal. He exhibits no distension and no ascites. There is no tenderness. No hernia.  Neurological:  He is alert and oriented to person, place, and time.  Psychiatric: His behavior is normal.  Flat affect   Results for orders placed or performed during the hospital encounter of 08/16/18 (from the past 48 hour(s))  Comprehensive metabolic panel     Status: Abnormal   Collection Time: 08/19/18  5:44 AM  Result Value Ref Range   Sodium 137 135 - 145 mmol/L   Potassium 3.5 3.5 - 5.1 mmol/L   Chloride 100 98 - 111 mmol/L   CO2 25 22 - 32 mmol/L   Glucose, Bld 111 (H) 70 - 99 mg/dL   BUN 7 6 - 20 mg/dL   Creatinine, Ser 0.77 0.61 - 1.24 mg/dL   Calcium 9.6 8.9 - 10.3 mg/dL   Total Protein 7.6 6.5 - 8.1 g/dL   Albumin 3.6 3.5 - 5.0 g/dL   AST 338 (H) 15 - 41 U/L   ALT 528 (H) 0 - 44 U/L   Alkaline Phosphatase 546 (H) 38 - 126 U/L   Total Bilirubin 11.7 (H) 0.3 - 1.2 mg/dL   GFR calc non Af Amer >60 >60 mL/min   GFR calc  Af Amer >60 >60 mL/min    Comment: (NOTE) The eGFR has been calculated using the CKD EPI equation. This calculation has not been validated in all clinical situations. eGFR's persistently <60 mL/min signify possible Chronic Kidney Disease.    Anion gap 12 5 - 15    Comment: Performed at [facility], [place] [address] [place], [place] [address]  Comprehensive metabolic panel     Status: Abnormal   Collection Time: 08/20/18  6:03 AM  Result Value Ref Range   Sodium 136 135 - 145 mmol/L   Potassium 3.5 3.5 - 5.1 mmol/L   Chloride 100 98 - 111 mmol/L   CO2 23 22 - 32 mmol/L   Glucose, Bld 116 (H) 70 - 99 mg/dL   BUN 10 6 - 20 mg/dL   Creatinine, Ser 0.81 0.61 - 1.24 mg/dL   Calcium 9.5 8.9 - 10.3 mg/dL   Total Protein 7.9 6.5 - 8.1 g/dL   Albumin 3.6 3.5 - 5.0 g/dL   AST 367 (H) 15 - 41 U/L   ALT 596 (H) 0 - 44 U/L   Alkaline Phosphatase 528 (H) 38 - 126 U/L   Total Bilirubin 12.0 (H) 0.3 - 1.2 mg/dL   GFR calc non Af Amer >60 >60 mL/min   GFR calc Af Amer >60 >60 mL/min    Comment: (NOTE) The eGFR has been calculated using the CKD EPI  equation. This calculation has not been validated in all clinical situations. eGFR's persistently <60 mL/min signify possible Chronic Kidney Disease.    Anion gap 13 5 - 15    Comment: Performed at [facility], [place] [address], [place], [place] [address]   Dg Ercp  Result Date: 08/19/2018 CLINICAL DATA:  Biliary obstruction EXAM: ERCP TECHNIQUE: Multiple spot images obtained with the fluoroscopic device and submitted for interpretation post-procedure. FLUOROSCOPY TIME:  Fluoroscopy Time:  7 minutes and 23 seconds Radiation Exposure Index (if provided by the fluoroscopic device): Number of Acquired Spot Images: 0 COMPARISON:  None. FINDINGS: Images demonstrate cannulation of the common bile duct and contrast filling the biliary tree. Biliary balloon dilatation is documented. IMPRESSION: See above. These images were submitted for radiologic interpretation only. Please see the procedural report for the amount of contrast and the fluoroscopy time utilized. Electronically Signed   By: Marybelle Killings M.D.   On: 08/19/2018 12:55   Assessment/Plan Hypothyroidism Major depressive disorder Chronic SMV thrombus on Eliquis - followed by Dr. Irene Limbo Primary sclerosing cholangitis Elevated liver enzymes  Cholelithiasis   No urgent role for laparoscopic cholecystectomy. While this patient did have gallbladder sludge present on ERCP, he does not have signs of acute cholecystitis or biliary colic.The patients vague RUQ pain and elevated liver enzymes are attributable to his underlying PSC and common bile duct stricture. His nausea and vomiting are not acute or postprandial, so low suspicion for biliary colic. Recommend following cytology from ERCP to rule out malignancy and confirm benign stricture. Trend LFTs, hopefully ERCP and dilation of stricture will improve patient's symptoms.  Ultimately, if the patient ends up needing cholecystectomy or other biliary surgery, it may be best to operate in a  location with tertiary GI care/hepatology (I think the patient currently sees Dr. Zollie Scale in [place]).    Jill Alexanders, [facility] Surgery 08/20/2018, 12:29 PM Pager: [phone] Consults: [phone]

## 2018-08-20 NOTE — Progress Notes (Signed)
ANTICOAGULATION CONSULT NOTE - Initial Consult  Pharmacy Consult for Heparin Indication: Superior mesenteric thrombosis  Allergies  Allergen Reactions  . Tylenol [Acetaminophen] Other (See Comments)    r/t elevated LFTs     Patient Measurements: Height: 6\' 3"  (190.5 cm) Weight: 190 lb (86.2 kg) IBW/kg (Calculated) : 84.5 Heparin Dosing Weight: actual body weight  Vital Signs: Temp: 98.8 F (37.1 C) (11/05 1408) Temp Source: Oral (11/05 1408) BP: 130/70 (11/05 1408) Pulse Rate: 62 (11/05 1408)  Labs: Recent Labs    08/18/18 0829 08/19/18 0544 08/20/18 0603  CREATININE 0.97 0.77 0.81    Estimated Creatinine Clearance: 166.6 mL/min (by C-G formula based on SCr of 0.81 mg/dL).   Medical History: Past Medical History:  Diagnosis Date  . GERD (gastroesophageal reflux disease)   . Graves disease 08/18/2015  . Hyperlipidemia   . Hypothyroid   . Palpitations   . PSC (primary sclerosing cholangitis)   . Vitamin D deficiency     Medications:   PTA on Eliquis 5mg  BID - LD 10/29 @ 11am   Admitted on 11/1 and Eliquis 5mg  BID resumed; d/c'ed on 11/2 (LD @08 :41)  Assessment:  27 yr male sent to ED from hematologist for an abnormal liver enzyme levels.  PMH significant for sclerosing cholangitis, superior mesenteric vein thrombosis (dx 04/2018), Graves disease.    S/p sphincterotomy 08/19/2018  Eliquis held starting on 11/2 for anticipated procedures  Pharmacy consulted to dose IV heparin today  As patient has had Eliquis within the past 72 hours, heparin levels may be falsely elevated.  Will monitor heparin infusion with aPTT levels until effects of Eliquis have worn off and heparin level and aPTT correlate.  Goal of Therapy:  Heparin level 0.3-0.7 units/ml aPTT 66-102 sec seconds Monitor platelets by anticoagulation protocol: Yes   Plan:   Obtain baseline aPTT and HL (to assess baseline effect of Eliquis)  Begin IV heparin @ 1400 units/hr  Check aPTT 6 hrs  after heparin started  Will monitor/adjust heparin based on aPTT until aPTT and heparin levels correlate  Follow CBC daily while on heparin  Jorgeluis Gurganus, Toribio Harbour, PharmD 08/20/2018,4:58 PM

## 2018-08-20 NOTE — Progress Notes (Signed)
PROGRESS NOTE    Kyle Gonzales   EXB:[id number]  DOB: 1991/04/09  DOA: 08/16/2018 PCP: [name], MD   Brief Narrative:  Kyle Gonzales is a 27 y.o. 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  Patient presented secondary to an abnormal lab from his hematologist office for routine follow-up.  Patient states that he has had a few days of nausea which is improved with continued vomiting.  He also has some mild right upper quadrant pain.     Subjective: No complaints today   Assessment & Plan:   Principal Problem:   PSC (primary sclerosing cholangitis)/   Elevated liver enzymes - MRCP shows advancement of disease- GI to discuss ERCP - cont to follow LFTs - Eliquis being held - sphincterotomy done and sludge removed- Gi requested gen surgery eval - gen surgery recommended to wait for path and recommended f/u at tertiary center - GI recommends that he be d/c'd once LFTs are improving which I agree with- they have gone up today  Active Problems:   Superior mesenteric vein thrombus 04/27/18 - holding Elilquis for procedures- he had a spincterotomy yesterday - start Heparin today - Dr Irene Limbo planned of following up again in 3 months form 8/6  HTN - Ziac  Thrush - started Nystatin- thrush still present  -11/4-  thrush not improving- he tells me that he is not swishing his mouth with the Nystatin- I have explained how to properly use it - noted to be improving today    Graves disease/  Hypothyroidism - s/p ablation- Synthroid      DVT prophylaxis: stop Eliquis- SCDS  Code Status: Full code Family Communication: mother at bedside Disposition Plan: home when stable Consultants:   GI Procedures:   ERCP:Impression:               - Non-dilated CBD, CHD. Dilated right and left main      hepatic ducts without evident stricture.                           - Biliary sphincteortomy performed.                           - Focal, 6-41mm long mid CBD stricture that was                            brushed for cytology and then dilated to 82mm with a                            Hurricane dilating balloon.                           - Copious biliary sludge/debris as well as several                            small but very clear black stone fragments were                             delivered into the duodenum. Antimicrobials:  Anti-infectives (From admission, onward)   Start     Dose/Rate Route Frequency Ordered  Stop   08/17/18 1500  ciprofloxacin (CIPRO) IVPB 400 mg     400 mg 200 mL/hr over 60 Minutes Intravenous Every 12 hours 08/17/18 1410         Objective: Vitals:   08/19/18 1440 08/19/18 2007 08/20/18 0333 08/20/18 1408  BP: 122/76 (!) 150/72 132/71 130/70  Pulse:  64 62 62  Resp:  17 16 18   Temp:  98.2 F (36.8 C) 98.2 F (36.8 C) 98.8 F (37.1 C)  TempSrc:  Oral Oral Oral  SpO2:  99% 100% 100%  Weight:      Height:        Intake/Output Summary (Last 24 hours) at 08/20/2018 1632 Last data filed at 08/20/2018 1438 Gross per 24 hour  Intake 440 ml  Output -  Net 440 ml   Filed Weights   08/16/18 1327  Weight: 86.2 kg    Examination: General exam: Appears comfortable  HEENT: PERRLA, oral mucosa moist, no sclera icterus or thrush Respiratory system: Clear to auscultation. Respiratory effort normal. Cardiovascular system: S1 & S2 heard,  No murmurs  Gastrointestinal system: Abdomen soft, non-tender, nondistended. Normal bowel sound. No organomegaly Central nervous system: Alert and oriented. No focal neurological deficits. Extremities: No cyanosis, clubbing or edema Skin: No rashes or ulcers Psychiatry:  Mood & affect appropriate.   Data Reviewed: I have personally reviewed following labs and imaging studies  CBC: Recent Labs  Lab 08/16/18 1150  08/17/18 0535  WBC 4.1 4.3  NEUTROABS 2.8  --   HGB 12.5* 12.1*  HCT 37.9* 37.4*  MCV 88.6 89.9  PLT 157 625*   Basic Metabolic Panel: Recent Labs  Lab 08/16/18 1150 08/17/18 0535 08/18/18 0829 08/19/18 0544 08/20/18 0603  NA 138 140 138 137 136  K 4.4 3.5 3.4* 3.5 3.5  CL 101 102 98 100 100  CO2 27 24 27 25 23   GLUCOSE 103* 120* 109* 111* 116*  BUN 9 8 8 7 10   CREATININE 1.14 0.98 0.97 0.77 0.81  CALCIUM 10.1 9.7 9.8 9.6 9.5   GFR: Estimated Creatinine Clearance: 166.6 mL/min (by C-G formula based on SCr of 0.81 mg/dL). Liver Function Tests: Recent Labs  Lab 08/16/18 1150 08/17/18 0535 08/18/18 0829 08/19/18 0544 08/20/18 0603  AST 303* 306* 330* 338* 367*  ALT 543* 470* 541* 528* 596*  ALKPHOS 750* 592* 601* 546* 528*  BILITOT 10.3* 7.8* 10.1* 11.7* 12.0*  PROT 8.3* 7.9 8.3* 7.6 7.9  ALBUMIN 3.7 4.0 4.1 3.6 3.6   No results for input(s): LIPASE, AMYLASE in the last 168 hours. No results for input(s): AMMONIA in the last 168 hours. Coagulation Profile: Recent Labs  Lab 08/16/18 1509  INR 0.88   Cardiac Enzymes: No results for input(s): CKTOTAL, CKMB, CKMBINDEX, TROPONINI in the last 168 hours. BNP (last 3 results) No results for input(s): PROBNP in the last 8760 hours. HbA1C: No results for input(s): HGBA1C in the last 72 hours. CBG: No results for input(s): GLUCAP in the last 168 hours. Lipid Profile: No results for input(s): CHOL, HDL, LDLCALC, TRIG, CHOLHDL, LDLDIRECT in the last 72 hours. Thyroid Function Tests: No results for input(s): TSH, T4TOTAL, FREET4, T3FREE, THYROIDAB in the last 72 hours. Anemia Panel: No results for input(s): VITAMINB12, FOLATE, FERRITIN, TIBC, IRON, RETICCTPCT in the last 72 hours. Urine analysis:    Component Value Date/Time   COLORURINE YELLOW 07/16/2018 1329   APPEARANCEUR CLEAR 07/16/2018 1329   LABSPEC 1.016 07/16/2018 1329   PHURINE 9.0 (H) 07/16/2018 1329   GLUCOSEU  NEGATIVE 07/16/2018 1329   HGBUR  NEGATIVE 07/16/2018 1329   BILIRUBINUR NEGATIVE 07/16/2018 1329   KETONESUR NEGATIVE 07/16/2018 1329   PROTEINUR NEGATIVE 07/16/2018 1329   NITRITE NEGATIVE 07/16/2018 1329   LEUKOCYTESUR NEGATIVE 07/16/2018 1329   Sepsis Labs: @LABRCNTIP (procalcitonin:4,lacticidven:4) )No results found for this or any previous visit (from the past 240 hour(s)).       Radiology Studies: Dg Ercp  Result Date: 08/19/2018 CLINICAL DATA:  Biliary obstruction EXAM: ERCP TECHNIQUE: Multiple spot images obtained with the fluoroscopic device and submitted for interpretation post-procedure. FLUOROSCOPY TIME:  Fluoroscopy Time:  7 minutes and 23 seconds Radiation Exposure Index (if provided by the fluoroscopic device): Number of Acquired Spot Images: 0 COMPARISON:  None. FINDINGS: Images demonstrate cannulation of the common bile duct and contrast filling the biliary tree. Biliary balloon dilatation is documented. IMPRESSION: See above. These images were submitted for radiologic interpretation only. Please see the procedural report for the amount of contrast and the fluoroscopy time utilized. Electronically Signed   By: Marybelle Killings M.D.   On: 08/19/2018 12:55      Scheduled Meds: . bisoprolol-hydrochlorothiazide  1 tablet Oral Daily  . cholecalciferol  5,000 Units Oral QODAY  . levothyroxine  75 mcg Oral Q0600  . nystatin  5 mL Oral QID  . pantoprazole  40 mg Oral Daily   Continuous Infusions: . ciprofloxacin 400 mg (08/20/18 1438)     LOS: 4 days    Time spent in minutes: 35    Debbe Odea, MD [facility] Pager: [email or web address] Password [email or web address] 08/20/2018, 4:32 PM

## 2018-08-21 ENCOUNTER — Encounter (HOSPITAL_COMMUNITY): Payer: Self-pay | Admitting: Gastroenterology

## 2018-08-21 ENCOUNTER — Other Ambulatory Visit: Payer: Self-pay

## 2018-08-21 DIAGNOSIS — E039 Hypothyroidism, unspecified: Secondary | ICD-10-CM

## 2018-08-21 DIAGNOSIS — I10 Essential (primary) hypertension: Secondary | ICD-10-CM

## 2018-08-21 DIAGNOSIS — F332 Major depressive disorder, recurrent severe without psychotic features: Secondary | ICD-10-CM

## 2018-08-21 DIAGNOSIS — E782 Mixed hyperlipidemia: Secondary | ICD-10-CM

## 2018-08-21 DIAGNOSIS — E0500 Thyrotoxicosis with diffuse goiter without thyrotoxic crisis or storm: Secondary | ICD-10-CM

## 2018-08-21 DIAGNOSIS — K8301 Primary sclerosing cholangitis: Secondary | ICD-10-CM

## 2018-08-21 DIAGNOSIS — K831 Obstruction of bile duct: Secondary | ICD-10-CM

## 2018-08-21 LAB — DIFFERENTIAL
Basophils Absolute: 0 10*3/uL (ref 0.0–0.1)
Basophils Relative: 1 %
EOS PCT: 1 %
Eosinophils Absolute: 0 10*3/uL (ref 0.0–0.5)
LYMPHS ABS: 1.3 10*3/uL (ref 0.7–4.0)
LYMPHS PCT: 28 %
MONO ABS: 0.5 10*3/uL (ref 0.1–1.0)
Monocytes Relative: 11 %
NEUTROS ABS: 2.6 10*3/uL (ref 1.7–7.7)
Neutrophils Relative %: 59 %

## 2018-08-21 LAB — COMPREHENSIVE METABOLIC PANEL
ALK PHOS: 492 U/L — AB (ref 38–126)
ALT: 533 U/L — AB (ref 0–44)
AST: 321 U/L — AB (ref 15–41)
Albumin: 3.5 g/dL (ref 3.5–5.0)
Anion gap: 12 (ref 5–15)
BILIRUBIN TOTAL: 9.1 mg/dL — AB (ref 0.3–1.2)
BUN: 8 mg/dL (ref 6–20)
CALCIUM: 9.3 mg/dL (ref 8.9–10.3)
CHLORIDE: 100 mmol/L (ref 98–111)
CO2: 26 mmol/L (ref 22–32)
CREATININE: 0.93 mg/dL (ref 0.61–1.24)
Glucose, Bld: 105 mg/dL — ABNORMAL HIGH (ref 70–99)
Potassium: 3.7 mmol/L (ref 3.5–5.1)
Sodium: 138 mmol/L (ref 135–145)
TOTAL PROTEIN: 7.2 g/dL (ref 6.5–8.1)

## 2018-08-21 LAB — CBC
HEMATOCRIT: 37.3 % — AB (ref 39.0–52.0)
HEMOGLOBIN: 12.1 g/dL — AB (ref 13.0–17.0)
MCH: 28.7 pg (ref 26.0–34.0)
MCHC: 32.4 g/dL (ref 30.0–36.0)
MCV: 88.4 fL (ref 80.0–100.0)
NRBC: 0 % (ref 0.0–0.2)
PLATELETS: 124 10*3/uL — AB (ref 150–400)
RBC: 4.22 MIL/uL (ref 4.22–5.81)
RDW: 18.3 % — ABNORMAL HIGH (ref 11.5–15.5)
WBC: 4.3 10*3/uL (ref 4.0–10.5)

## 2018-08-21 LAB — APTT
APTT: 49 s — AB (ref 24–36)
aPTT: 88 seconds — ABNORMAL HIGH (ref 24–36)

## 2018-08-21 LAB — HEPARIN LEVEL (UNFRACTIONATED): HEPARIN UNFRACTIONATED: 0.73 [IU]/mL — AB (ref 0.30–0.70)

## 2018-08-21 LAB — PHOSPHORUS: PHOSPHORUS: 3.9 mg/dL (ref 2.5–4.6)

## 2018-08-21 LAB — MAGNESIUM: Magnesium: 1.8 mg/dL (ref 1.7–2.4)

## 2018-08-21 MED ORDER — NYSTATIN 100000 UNIT/ML MT SUSP: 5.0000 mL | Freq: Four times a day (QID) | OROMUCOSAL | 0 refills | Status: DC

## 2018-08-21 MED ORDER — APIXABAN 5 MG PO TABS
5.0000 mg | ORAL_TABLET | Freq: Two times a day (BID) | ORAL | Status: DC
  Administered 2018-08-21: 5 mg via ORAL
  Filled 2018-08-21: qty 1

## 2018-08-21 MED ORDER — HEPARIN (PORCINE) IN NACL 100-0.45 UNIT/ML-% IJ SOLN
1500.0000 [IU]/h | INTRAMUSCULAR | Status: AC
  Filled 2018-08-21: qty 250

## 2018-08-21 MED ORDER — CIPROFLOXACIN HCL 500 MG PO TABS: 500.0000 mg | ORAL_TABLET | Freq: Two times a day (BID) | ORAL | 0 refills | Status: AC

## 2018-08-21 NOTE — Discharge Summary (Signed)
Physician Discharge Summary  Chanc Kervin IRS:[id number] DOB: Dec 23, 1990 DOA: 08/16/2018  PCP: [name], MD  Admit date: 08/16/2018 Discharge date: 08/21/2018  Admitted From: Home Disposition: Home  Recommendations for Outpatient Follow-up:  1. Follow up with PCP in 1-2 weeks 2. Follow up with Gastroenterology in 6-8 weeks and repeat Labs in 7-10 days 3. Follow up with Dr. Zamor/[facility] 4. Follow up with Hematology as an outpatient in 1-2 weeks 5. Please obtain CMP/CBC, Mag, Phos in one week 6. 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 Patient states that he has had a few days of nausea which is improved with continued vomiting. He also has some mild right upper quadrant pain. He was admitted for [facility] with disease advancement. Underwent ERCP with Sphincterotomy and sludge removal.  General surgery was consulted for further evaluation recommendations and recommended a lap chole may manage gallstone will not change the structures and recommending waiting for the pathology result.  General surgery also recommended that the patient be followed at a tertiary care center and that there is no urgent surgery indication at this point.  Patient's LFTs improved and he was deemed medically stable to be D/C'd home and follow up with PCP and Gastroenterology in the outpatient setting.   Discharge Diagnoses:  Principal Problem:   PSC (primary sclerosing  cholangitis) Active Problems:   Graves disease   Hypothyroidism   Hyperlipidemia, mixed   MDD (major depressive disorder), recurrent severe, without psychosis ([place])   Hypertension   DVT (deep venous thrombosis) (HCC)   Abnormal liver enzymes   Abnormal magnetic resonance cholangiopancreatography (MRCP)   Bile duct obstruction    PSC (primary sclerosing cholangitis)/   Elevated liver enzymes - MRCP shows advancement of disease- GI to discuss ERCP - cont to follow LFTs - Eliquis being held but resumed today - sphincterotomy done and sludge removed- GI requested gen surgery eval - gen surgery recommended to wait for path and recommended f/u at tertiary center - GI recommends that he be d/c'd once LFTs are improving; They improved some today so he was deemed medically stable to D/C home   Superior mesenteric vein thrombus 04/27/18 - holding Elilquis for procedures- he had a spincterotomy on 08/19/18 - started Heparin but tranistioned back to Eliquis - Dr Irene Limbo planned of following up again in 3 months form 8/6  HTN - C/w Ziac  Thrush - started Nystatin- thrush still present  -11/4-  thrush not improving- he tells me that he is not swishing his mouth with the Nystatin- I have explained how to properly use it - noted to be improving today; C/w Nystatin at D/C  Graves disease/  Hypothyroidism - s/p ablation -C/w Synthroid  Discharge Instructions  Discharge Instructions    Call MD for:  difficulty breathing, headache or visual disturbances   Complete by:  As directed    Call MD for:  extreme fatigue   Complete by:  As directed    Call MD for:  hives   Complete by:  As directed    Call MD for:  persistant dizziness or light-headedness   Complete  by:  As directed    Call MD for:  persistant nausea and vomiting   Complete by:  As directed    Call MD for:  redness, tenderness, or signs of infection (pain, swelling, redness, odor or green/yellow discharge around incision site)    Complete by:  As directed    Call MD for:  severe uncontrolled pain   Complete by:  As directed    Call MD for:  temperature >100.4   Complete by:  As directed    Diet - low sodium heart healthy   Complete by:  As directed    Discharge instructions   Complete by:  As directed    You were cared for by a hospitalist during your hospital stay. If you have any questions about your discharge medications or the care you received while you were in the hospital after you are discharged, you can call the unit and ask to speak with the hospitalist on call if the hospitalist that took care of you is not available. Once you are discharged, your primary care physician will handle any further medical issues. Please note that NO REFILLS for any discharge medications will be authorized once you are discharged, as it is imperative that you return to your primary care physician (or establish a relationship with a primary care physician if you do not have one) for your aftercare needs so that they can reassess your need for medications and monitor your lab values.  Follow up with PCP, Gastroenterology, and General Surgery in the outpatient setting. Take all medications as prescribed. If symptoms change or worsen please return to the ED for evaluation   Increase activity slowly   Complete by:  As directed      Allergies as of 08/21/2018      Reactions   Tylenol [acetaminophen] Other (See Comments)   r/t elevated LFTs      Medication List    TAKE these medications   alum & mag hydroxide-simeth 578-469-62 MG/5ML suspension Commonly known as:  MAALOX PLUS Take 15 mLs by mouth every 6 (six) hours as needed for indigestion.   apixaban 5 MG Tabs tablet Commonly known as:  ELIQUIS Take 1 tablet (5 mg total) by mouth 2 (two) times daily.   bisoprolol-hydrochlorothiazide 10-6.25 MG tablet Commonly known as:  ZIAC Take 1 tablet by mouth daily.   ciprofloxacin 500 MG tablet Commonly known as:  CIPRO Take 1  tablet (500 mg total) by mouth 2 (two) times daily for 5 days.   ezetimibe 10 MG tablet Commonly known as:  ZETIA TAKE 1 TABLET BY MOUTH ONCE DAILY FOR CHOLESTEROL What changed:    how much to take  how to take this  when to take this  additional instructions   hydrOXYzine 25 MG tablet Commonly known as:  ATARAX/VISTARIL Take 1 tablet (25 mg total) by mouth 3 (three) times daily as needed for anxiety.   ibuprofen 200 MG tablet Commonly known as:  ADVIL,MOTRIN Take 600 mg by mouth every 6 (six) hours as needed (For stomach pain.).   levothyroxine 75 MCG tablet Commonly known as:  SYNTHROID, LEVOTHROID TAKE 1 TABLET BY MOUTH EVERY MORNING ON AN EMPTY STOMACH   lidocaine 2 % solution Commonly known as:  XYLOCAINE Use as directed 15 mLs in the mouth or throat every 6 (six) hours as needed for mouth pain.   nystatin 100000 UNIT/ML suspension Commonly known as:  MYCOSTATIN Take 5 mLs (500,000 Units total) by mouth 4 (four) times daily.  omeprazole 40 MG capsule Commonly known as:  PRILOSEC Take 1 capsule (40 mg total) by mouth daily before breakfast. What changed:    when to take this  reasons to take this   ondansetron 8 MG tablet Commonly known as:  ZOFRAN Take 1 tablet 3 x/ day before meals - Nausea What changed:    how much to take  how to take this  when to take this  reasons to take this  additional instructions   sucralfate 1 GM/10ML suspension Commonly known as:  CARAFATE Take 1 g by mouth daily as needed (stomach pain). What changed:  Another medication with the same name was removed. Continue taking this medication, and follow the directions you see here.   Vitamin D3 125 MCG (5000 UT) Caps Take 1 capsule (5,000 Units total) by mouth every other day.       Allergies  Allergen Reactions  . Tylenol [Acetaminophen] Other (See Comments)    r/t elevated LFTs    Consultations:  Gastroenterology  General Surgery   Procedures/Studies: Mr  3d Recon At Scanner  Result Date: 08/16/2018 CLINICAL DATA:  Pain and abnormal liver function test. History of sclerosing cholangitis. EXAM: MRI ABDOMEN WITHOUT AND WITH CONTRAST (INCLUDING MRCP) TECHNIQUE: Multiplanar multisequence MR imaging of the abdomen was performed both before and after the administration of intravenous contrast. Heavily T2-weighted images of the biliary and pancreatic ducts were obtained, and three-dimensional MRCP images were rendered by post processing. CONTRAST:  10 cc Gadavist COMPARISON:  MRI 04/26/2018. FINDINGS: Lower chest: No acute findings. Hepatobiliary: There is marked intrahepatic bile duct dilatation which appears progressive compared with previous exam. Normal caliber of the common bile duct. Diffuse gallbladder distension is again noted. Sludge and/or tiny stones stones. Unchanged appearance of hemangiomas within the right lobe of liver, image 33/01/12/2002 and image 52/1304. Pancreas: No mass, inflammatory changes, or other parenchymal abnormality identified. Spleen:  Within normal limits in size and appearance. Adrenals/Urinary Tract: Normal appearance of the adrenal glands. Kidneys are unremarkable. Stomach/Bowel: Visualized portions within the abdomen are unremarkable. Vascular/Lymphatic: Normal appearance of the abdominal aorta. Considerable motion artifact diminishes mesenteric vascular detail. There is a diminutive superior mesenteric vein. The splenic vein remains patent. 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 No adenopathy identified. Other:  No ascites. Musculoskeletal: No suspicious bone lesions identified. IMPRESSION: 1. There is been interval progression of intrahepatic biliary ductal dilatation without common bile duct dilatation. 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 2. Small amount of gallbladder sludge or tiny stones noted. 3. 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 The superior mesenteric vein appears diminutive. 4. Right lobe of liver hemangiomas. Electronically Signed   By: Kerby Moors M.D.   On: 08/16/2018 22:35   Dg Ercp  Result Date: 08/19/2018 CLINICAL DATA:  Biliary obstruction EXAM: ERCP TECHNIQUE: Multiple spot images obtained with the fluoroscopic device and submitted for interpretation post-procedure. FLUOROSCOPY TIME:  Fluoroscopy Time:  7 minutes and 23 seconds Radiation Exposure Index (if provided by the fluoroscopic device): Number of Acquired Spot Images: 0 COMPARISON:  None. FINDINGS: Images demonstrate cannulation of the common bile duct and contrast filling the biliary tree. Biliary balloon dilatation is documented. IMPRESSION: See above. These images were submitted for radiologic interpretation only. Please see the procedural report for the amount of contrast and the fluoroscopy time utilized. Electronically Signed   By: Marybelle Killings M.D.   On: 08/19/2018 12:55   Mr  Abdomen Mrcp W Wo Contast  Result Date: 08/16/2018 CLINICAL DATA:  Pain and abnormal liver function test. History of sclerosing cholangitis. EXAM: MRI ABDOMEN WITHOUT AND WITH CONTRAST (INCLUDING MRCP) TECHNIQUE: Multiplanar multisequence MR imaging of the abdomen was performed both before and after the administration of intravenous contrast. Heavily T2-weighted images of the biliary and pancreatic ducts were obtained, and three-dimensional MRCP images were rendered by post processing. CONTRAST:  10 cc Gadavist COMPARISON:  MRI 04/26/2018. FINDINGS: Lower chest: No acute findings. Hepatobiliary: There is marked intrahepatic bile duct dilatation which appears progressive compared with previous exam. Normal caliber of the common bile duct. Diffuse gallbladder distension is again noted. Sludge and/or tiny stones stones. Unchanged appearance of hemangiomas within the right lobe of liver, image 33/01/12/2002 and image 52/1304. Pancreas: No mass,  inflammatory changes, or other parenchymal abnormality identified. Spleen:  Within normal limits in size and appearance. Adrenals/Urinary Tract: Normal appearance of the adrenal glands. Kidneys are unremarkable. Stomach/Bowel: Visualized portions within the abdomen are unremarkable. Vascular/Lymphatic: Normal appearance of the abdominal aorta. Considerable motion artifact diminishes mesenteric vascular detail. There is a diminutive superior mesenteric vein. The splenic vein remains patent. 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 No adenopathy identified. Other:  No ascites. Musculoskeletal: No suspicious bone lesions identified. IMPRESSION: 1. There is been interval progression of intrahepatic biliary ductal dilatation without common bile duct dilatation. 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 2. Small amount of gallbladder sludge or tiny stones noted. 3. 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 The superior mesenteric vein appears diminutive. 4. Right lobe of liver hemangiomas. Electronically Signed   By: Kerby Moors M.D.   On: 08/16/2018 22:35     Subjective: Seen and examined at bedside was doing better.  Denies any chest pain, lightheadedness or dizziness.  No abdominal pain.  No nausea or vomiting.  Ready to go home.  Discharge Exam: Vitals:   08/20/18 2012 08/21/18 0528  BP: (!) 142/75 (!) 104/55  Pulse: 61 (!) 51  Resp: 16 16  Temp: 99.1 F (37.3 C) 98.1 F (36.7 C)  SpO2: 100% 100%   Vitals:   08/20/18 0333 08/20/18 1408 08/20/18 2012 08/21/18 0528  BP: 132/71 130/70 (!) 142/75 (!) 104/55  Pulse: 62 62 61 (!) 51  Resp: 16 18 16 16   Temp: 98.2 F (36.8 C) 98.8 F (37.1 C) 99.1 F (37.3 C) 98.1 F (36.7 C)  TempSrc: Oral Oral Oral Oral  SpO2: 100% 100% 100% 100%  Weight:      Height:       General: Pt is alert, awake, not in acute distress; Somewhat  jaundiced  Cardiovascular: RRR, S1/S2 +, no rubs, no gallops Respiratory: Diminished bilaterally, no wheezing, no rhonchi Abdominal: Soft, NT, ND, bowel sounds + Extremities: no edema, no cyanosis  The results of significant diagnostics from this hospitalization (including imaging, microbiology, ancillary and laboratory) are listed below for reference.    Microbiology: No results found for this or any previous visit (from the past 240 hour(s)).   Labs: BNP (last 3 results) No results for input(s): BNP in the last 8760 hours. Basic Metabolic Panel: Recent Labs  Lab 08/17/18 0535 08/18/18 0829 08/19/18 0544 08/20/18 0603 08/21/18 0810  NA 140 138 137 136 138  K 3.5 3.4* 3.5 3.5 3.7  CL 102 98 100 100 100  CO2 24 27 25 23 26   GLUCOSE 120* 109* 111* 116* 105*  BUN 8  8 7 10 8   CREATININE 0.98 0.97 0.77 0.81 0.93  CALCIUM 9.7 9.8 9.6 9.5 9.3  MG  --   --   --   --  1.8  PHOS  --   --   --   --  3.9   Liver Function Tests: Recent Labs  Lab 08/17/18 0535 08/18/18 0829 08/19/18 0544 08/20/18 0603 08/21/18 0810  AST 306* 330* 338* 367* 321*  ALT 470* 541* 528* 596* 533*  ALKPHOS 592* 601* 546* 528* 492*  BILITOT 7.8* 10.1* 11.7* 12.0* 9.1*  PROT 7.9 8.3* 7.6 7.9 7.2  ALBUMIN 4.0 4.1 3.6 3.6 3.5   No results for input(s): LIPASE, AMYLASE in the last 168 hours. No results for input(s): AMMONIA in the last 168 hours. CBC: Recent Labs  Lab 08/16/18 1150 08/17/18 0535 08/21/18 0810  WBC 4.1 4.3 4.3  NEUTROABS 2.8  --  2.6  HGB 12.5* 12.1* 12.1*  HCT 37.9* 37.4* 37.3*  MCV 88.6 89.9 88.4  PLT 157 147* 124*   Cardiac Enzymes: No results for input(s): CKTOTAL, CKMB, CKMBINDEX, TROPONINI in the last 168 hours. BNP: Invalid input(s): POCBNP CBG: No results for input(s): GLUCAP in the last 168 hours. D-Dimer No results for input(s): DDIMER in the last 72 hours. Hgb A1c No results for input(s): HGBA1C in the last 72 hours. Lipid Profile No results for input(s):  CHOL, HDL, LDLCALC, TRIG, CHOLHDL, LDLDIRECT in the last 72 hours. Thyroid function studies No results for input(s): TSH, T4TOTAL, T3FREE, THYROIDAB in the last 72 hours.  Invalid input(s): FREET3 Anemia work up No results for input(s): VITAMINB12, FOLATE, FERRITIN, TIBC, IRON, RETICCTPCT in the last 72 hours. Urinalysis    Component Value Date/Time   COLORURINE YELLOW 07/16/2018 1329   APPEARANCEUR CLEAR 07/16/2018 1329   LABSPEC 1.016 07/16/2018 1329   PHURINE 9.0 (H) 07/16/2018 1329   GLUCOSEU NEGATIVE 07/16/2018 1329   HGBUR NEGATIVE 07/16/2018 1329   BILIRUBINUR NEGATIVE 07/16/2018 1329   KETONESUR NEGATIVE 07/16/2018 1329   PROTEINUR NEGATIVE 07/16/2018 1329   NITRITE NEGATIVE 07/16/2018 1329   LEUKOCYTESUR NEGATIVE 07/16/2018 1329   Sepsis Labs Invalid input(s): PROCALCITONIN,  WBC,  LACTICIDVEN Microbiology No results found for this or any previous visit (from the past 240 hour(s)).  Time coordinating discharge: 35 minutes  SIGNED:  Kerney Elbe, DO [facility] 08/21/2018, 12:22 PM Pager is on [place]  If 7PM-7AM, please contact night-coverage [email or web address] Password [email or web address]

## 2018-08-21 NOTE — Progress Notes (Signed)
ANTICOAGULATION CONSULT NOTE - Follow Up Consult  Pharmacy Consult for Heparin Indication: Superior mesenteric thrombosis  Allergies  Allergen Reactions  . Tylenol [Acetaminophen] Other (See Comments)    r/t elevated LFTs     Patient Measurements: Height: 6\' 3"  (190.5 cm) Weight: 190 lb (86.2 kg) IBW/kg (Calculated) : 84.5 Heparin Dosing Weight:   Vital Signs: Temp: 99.1 F (37.3 C) (11/05 2012) Temp Source: Oral (11/05 2012) BP: 142/75 (11/05 2012) Pulse Rate: 61 (11/05 2012)  Labs: Recent Labs    08/18/18 0829 08/19/18 0544 08/20/18 0603 08/20/18 1731 08/20/18 2344  APTT  --   --   --  28 49*  HEPARINUNFRC  --   --   --  <0.10*  --   CREATININE 0.97 0.77 0.81  --   --     Estimated Creatinine Clearance: 166.6 mL/min (by C-G formula based on SCr of 0.81 mg/dL).   Medications:  Infusions:  . ciprofloxacin 400 mg (08/20/18 1438)  . heparin 1,400 Units/hr (08/20/18 1725)    Assessment: Patient with low PTT.  No heparin issues per RN. PTT ordered with Heparin level until both correlate due to possible drug-lab interaction between oral anticoagulant (rivaroxaban, edoxaban, or apixaban) and anti-Xa level (aka heparin level)   Goal of Therapy:  Heparin level 0.3-0.7 units/ml aPTT 66-102 seconds Monitor platelets by anticoagulation protocol: Yes   Plan:  Increase heparin to 1600 units/hr Recheck PTT/heparin level at 0800  Tyler Deis, Shea Stakes [place] 08/21/2018,12:35 AM

## 2018-08-21 NOTE — Progress Notes (Signed)
Patient ID: Kyle Gonzales, male   DOB: 31-Aug-1991, 27 y.o.   MRN: [id number]     Progress Note   Subjective  Feels fine - no c/o abdominal pain, no nausea, eating .  CBD brushing -no malignant cells   Tbili trending down - 9.1 today   Objective   Vital signs in last 24 hours: Temp:  [98.1 F (36.7 C)-99.1 F (37.3 C)] 98.1 F (36.7 C) (11/06 0528) Pulse Rate:  [51-62] 51 (11/06 0528) Resp:  [16-18] 16 (11/06 0528) BP: (104-142)/(55-75) 104/55 (11/06 0528) SpO2:  [100 %] 100 % (11/06 0528) Last BM Date: 08/15/18 General:   Young  AA male  in NAD Heart:  Regular rate and rhythm; no murmurs Lungs: Respirations even and unlabored, lungs CTA bilaterally Abdomen:  Soft, nontender and nondistended. Normal bowel sounds. Extremities:  Without edema. Neurologic:  Alert and oriented,  grossly normal neurologically. Psych:  Cooperative. Normal mood and affect.  Intake/Output from previous day: 11/05 0701 - 11/06 0700 In: 1483.7 [P.O.:840; I.V.:43.7; IV Piggyback:600] Out: -  Intake/Output this shift: Total I/O In: 120 [P.O.:120] Out: -   Lab Results: Recent Labs    08/21/18 0810  WBC 4.3  HGB 12.1*  HCT 37.3*  PLT 124*   BMET Recent Labs    08/19/18 0544 08/20/18 0603 08/21/18 0810  NA 137 136 138  K 3.5 3.5 3.7  CL 100 100 100  CO2 25 23 26   GLUCOSE 111* 116* 105*  BUN 7 10 8   CREATININE 0.77 0.81 0.93  CALCIUM 9.6 9.5 9.3   LFT Recent Labs    08/21/18 0810  PROT 7.2  ALBUMIN 3.5  AST 321*  ALT 533*  ALKPHOS 492*  BILITOT 9.1*   PT/INR No results for input(s): LABPROT, INR in the last 72 hours.  Studies/Results: Dg Ercp  Result Date: 08/19/2018 CLINICAL DATA:  Biliary obstruction EXAM: ERCP TECHNIQUE: Multiple spot images obtained with the fluoroscopic device and submitted for interpretation post-procedure. FLUOROSCOPY TIME:  Fluoroscopy Time:  7 minutes and 23 seconds Radiation Exposure Index (if provided by the fluoroscopic device): Number of  Acquired Spot Images: 0 COMPARISON:  None. FINDINGS: Images demonstrate cannulation of the common bile duct and contrast filling the biliary tree. Biliary balloon dilatation is documented. IMPRESSION: See above. These images were submitted for radiologic interpretation only. Please see the procedural report for the amount of contrast and the fluoroscopy time utilized. Electronically Signed   By: Marybelle Killings M.D.   On: 08/19/2018 12:55       Assessment / Plan:    #55  27 year old African-American male with PSC, previously felt to be small duct disease, who presented with jaundice, mild upper abdominal pain and nausea on 08/16/2018.  Work-up since admission including ERCP showed a mid common bile duct stricture, which has been brushed and balloon dilated per Dr. Ardis Hughs. Brushings have returned showing no malignant cells. LFTs are now trending down.  He did not have any overt evidence of cholangitis, has been on IV antibiotics.  He appears to have large duct involvement at this point with [facility]  # 2 gallbladder sludge/small stones-surgery consulted yesterday and would prefer patient have cholecystectomy at a tertiary care center.  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   Plan; will discuss with Dr. Ardis Hughs, expect he is stable for discharge this afternoon Would complete 10 days total of antibiotics convert to oral  We will arrange for outpatient  follow-up in our office, and repeat labs early next week. Will also arrange for a follow-up appointment with Dr.  [facility].  Hopefully initial appointment can occur in the [facility] office and then they can refer as appropriate to surgery for lap chole.  Okay to resume Eliquis on discharge He has not been on Urso-possible benefit/will discuss with Dr. Havery Moros.    Contact  Kyle Gonzales, P.A.-C               [phone]      Principal Problem:   PSC (primary sclerosing cholangitis) Active Problems:   Graves disease   Hypothyroidism   Hyperlipidemia, mixed   MDD (major depressive disorder), recurrent severe, without psychosis ([place])   Hypertension   DVT (deep venous thrombosis) (HCC)   Abnormal liver enzymes   Abnormal magnetic resonance cholangiopancreatography (MRCP)   Bile duct obstruction     LOS: 5 days   Kyle Gonzales  08/21/2018, 9:50 AM

## 2018-08-22 ENCOUNTER — Telehealth: Payer: Self-pay

## 2018-08-22 NOTE — Telephone Encounter (Signed)
-----   Message from Alfredia Ferguson, [place] sent at 08/21/2018 12:32 PM EST ----- Regarding: Kyle Gonzales Pt being discharged today from [facility] with jaundice /pain , Had mid CBD stricture - brushings neg for malignancy - stricture dilated per Dr Ardis Hughs   Home on Cipro  X 4 more days  Back on Eliquis for SMV thrombosis  Gregary Signs- he needs repeat hepatic panel in one week - Ardis Hughs said can put under his name .  Pt needs a follow up appt with Dr Havery Moros 3-4 weeks   He also needs a follow up appt with Dr Zollie Scale / [facility]   In [place] office   First available  For follow up [facility], now CBD stricture , and GB sludge/small stones . Needs to have lap chole - surgeons here  deferred and advised he have it done at tertiary care center - so hopefully in [place] .  Please fax info from this hospitalization , H/P , D/C summary , ERCP report , Imaging etc  To Dr. Zollie Scale.  Do not put on APP schedule  If possible - I have seen him , but he has not seen Dr Havery Moros  For awhile and needs to see him  Thank -you

## 2018-08-22 NOTE — Telephone Encounter (Signed)
Labs ordered, appointment made with Dr. Havery Moros, faxed information to Dr. Precious Gilding office requesting they contact patient to schedule follow up.

## 2018-08-22 NOTE — Telephone Encounter (Signed)
Faxed information to [facility] to have them schedule a follow up visit for patient.

## 2018-08-23 ENCOUNTER — Ambulatory Visit: Payer: Self-pay | Admitting: Hematology

## 2018-08-23 ENCOUNTER — Ambulatory Visit: Payer: Self-pay | Admitting: Gastroenterology

## 2018-08-23 ENCOUNTER — Other Ambulatory Visit: Payer: Self-pay

## 2018-09-01 ENCOUNTER — Other Ambulatory Visit: Payer: Self-pay | Admitting: Internal Medicine

## 2018-09-01 DIAGNOSIS — R112 Nausea with vomiting, unspecified: Secondary | ICD-10-CM

## 2018-09-04 ENCOUNTER — Encounter: Payer: Self-pay | Admitting: Internal Medicine

## 2018-09-04 ENCOUNTER — Ambulatory Visit: Payer: 59 | Admitting: Internal Medicine

## 2018-09-04 VITALS — BP 136/82 | HR 68 | Temp 97.1°F | Resp 16 | Ht 75.0 in | Wt 218.4 lb

## 2018-09-04 DIAGNOSIS — I10 Essential (primary) hypertension: Secondary | ICD-10-CM

## 2018-09-04 DIAGNOSIS — Z79899 Other long term (current) drug therapy: Secondary | ICD-10-CM

## 2018-09-04 DIAGNOSIS — F332 Major depressive disorder, recurrent severe without psychotic features: Secondary | ICD-10-CM

## 2018-09-04 DIAGNOSIS — E039 Hypothyroidism, unspecified: Secondary | ICD-10-CM

## 2018-09-04 DIAGNOSIS — R945 Abnormal results of liver function studies: Secondary | ICD-10-CM | POA: Diagnosis not present

## 2018-09-04 DIAGNOSIS — K8301 Primary sclerosing cholangitis: Secondary | ICD-10-CM | POA: Diagnosis not present

## 2018-09-04 DIAGNOSIS — E782 Mixed hyperlipidemia: Secondary | ICD-10-CM | POA: Diagnosis not present

## 2018-09-04 DIAGNOSIS — K55069 Acute infarction of intestine, part and extent unspecified: Secondary | ICD-10-CM

## 2018-09-04 DIAGNOSIS — I81 Portal vein thrombosis: Secondary | ICD-10-CM

## 2018-09-04 DIAGNOSIS — R7989 Other specified abnormal findings of blood chemistry: Secondary | ICD-10-CM

## 2018-09-04 MED ORDER — ESCITALOPRAM OXALATE 20 MG PO TABS: ORAL_TABLET | ORAL | 1 refills | Status: DC

## 2018-09-04 MED ORDER — BUSPIRONE HCL 10 MG PO TABS: ORAL_TABLET | ORAL | 5 refills | Status: DC

## 2018-09-04 NOTE — Progress Notes (Signed)
Kyle Gonzales     This very nice 27 y.o. 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 The patient now presents for follow up for transition & f/u  from recent hospitalization.  The day after discharge  our clinical staff contacted the patient to assure stability and schedule a follow up appointment. The discharge summary, medications and diagnostic test results were reviewed before meeting with the patient. The patient was admitted for:      Patient was hospitalized for refractory N/V and worsening LFT's and underwent ERCP with sphincterotomy (08/19/2018) & sludge removal by Dr Ardis Hughs. General surgical consultation did not recommend Cholecystectomy and did recommend f/u at a tertiary care center. Patient's N/V resolved & advanced  diet was tolerated and he was discharged for out-patient f/u/. He does also have up coming f/u with his GI Consultant - Dr Havery Moros in the next 2-3 weeks.   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         Today , patient also relates that he stopped Marijuana 1 week ago & he's having issues with anxiety and anger issues  He does have hx/o Depression in large part related to his multiple serious health issues at his young age. He denies SI / HI.      Patient is also followed with Hypertension, Hyperlipidemia, Post Ablative Hypothyroidism (Graves Dz)  and Vitamin D Deficiency.      Patient is treated for HTN & BP has been controlled at home. Today's BP is at goal - 136/82. Patient has had no complaints of any cardiac type chest pain,  palpitations, dyspnea/orthopnea/PND, dizziness, claudication, or dependent edema.     Hyperlipidemia is not controlled with diet & Zetia. Last Lipids were not at goal: Lab Results  Component Value Date   CHOL 178 06/05/2018   HDL 49 06/05/2018   LDLCALC 110 (H) 06/05/2018   TRIG 93 06/05/2018   CHOLHDL 3.6 06/05/2018      Also, the patient has history of abnormal glucoses and has had no symptoms of reactive hypoglycemia, diabetic polys, paresthesias or visual blurring.  Last A1c was Normal & at goal: Lab Results  Component Value Date   HGBA1C 5.5 02/04/2018      Further, the patient also has history of Vitamin D Deficiency and supplements vitamin D without any suspected side-effects. Last vitamin D was at goal: Lab Results  Component Value Date   VD25OH 67 02/04/2018   Current Outpatient Medications on File Prior to Visit  Medication Sig  . alum & mag hydroxide-simeth (MAALOX ADVANCED MAX ST) 400-400-40 MG/5ML suspension Take 15 mLs by mouth every 6 (six) hours as needed for indigestion.  Marland Kitchen apixaban (ELIQUIS) 5 MG TABS tablet Take 1 tablet (5 mg total) by mouth 2 (two) times daily.  . bisoprolol-hydrochlorothiazide (ZIAC) 10-6.25 MG tablet Take 1 tablet by mouth daily.  . Cholecalciferol (VITAMIN D3) 5000 units CAPS Take 1 capsule (5,000 Units total) by mouth every other day.  . ezetimibe (ZETIA) 10 MG tablet TAKE 1 TABLET BY MOUTH ONCE DAILY FOR CHOLESTEROL (Patient taking differently: Take 10 mg by mouth daily. )  . hydrOXYzine (ATARAX/VISTARIL) 25  MG tablet Take 1 tablet (25 mg total) by mouth 3 (three) times daily as needed for anxiety.  Marland Kitchen levothyroxine (SYNTHROID, LEVOTHROID) 75 MCG tablet TAKE 1 TABLET BY MOUTH EVERY MORNING ON AN EMPTY STOMACH  . nystatin (MYCOSTATIN) 100000 UNIT/ML suspension Take 5 mLs (500,000 Units total) by mouth 4 (four) times daily.  Marland Kitchen omeprazole (PRILOSEC) 40 MG capsule Take 1 capsule (40 mg total) by mouth daily before breakfast. (Patient taking  differently: Take 40 mg by mouth daily as needed (heartburn). )  . ondansetron (ZOFRAN) 8 MG tablet Take 1 tablet 3 x /day before meals for Nausea   No current facility-administered medications on file prior to visit.    Allergies  Allergen Reactions  . Tylenol [Acetaminophen] Other (See Comments)    r/t elevated LFTs    PMHx:   Past Medical History:  Diagnosis Date  . GERD (gastroesophageal reflux disease)   . Graves disease 08/18/2015  . Hyperlipidemia   . Hypothyroid   . Palpitations   . PSC (primary sclerosing cholangitis)   . Vitamin D deficiency    Immunization History  Administered Date(s) Administered  . PPD Test 12/06/2015  . Td 10/16/2008   Past Surgical History:  Procedure Laterality Date  . ANTERIOR CRUCIATE LIGAMENT REPAIR  2012  . ERCP N/A 08/19/2018   Procedure: ENDOSCOPIC RETROGRADE CHOLANGIOPANCREATOGRAPHY (ERCP);  Surgeon: Milus Banister, MD;  Location: [facility];  Service: Endoscopy;  Laterality: N/A;  . REMOVAL OF STONES  08/19/2018   Procedure: REMOVAL OF STONES;  Surgeon: Milus Banister, MD;  Location: [facility];  Service: Endoscopy;;  . Joan Mayans  08/19/2018   Procedure: Joan Mayans;  Surgeon: Milus Banister, MD;  Location: [facility] ENDOS[facility];  Service: Endoscopy;;  . WISDOM TOOTH EXTRACTION     FHx:    Reviewed / unchanged  SHx:    Reviewed / unchanged  Systems Review:  Constitutional: Denies fever, chills, wt changes, headaches, insomnia, fatigue, night sweats, change in appetite. Eyes: Denies redness, blurred vision, diplopia, discharge, itchy, watery eyes.  ENT: Denies discharge, congestion, post nasal drip, epistaxis, sore throat, earache, hearing loss, dental pain, tinnitus, vertigo, sinus pain, snoring.  CV: Denies chest pain, palpitations, irregular heartbeat, syncope, dyspnea, diaphoresis, orthopnea, PND, claudication or edema. Respiratory: denies cough, dyspnea, DOE, pleurisy, hoarseness, laryngitis, wheezing.    Gastrointestinal: Denies dysphagia, odynophagia, heartburn, reflux, water brash, abdominal pain or cramps, nausea, vomiting, bloating, diarrhea, constipation, hematemesis, melena, hematochezia  or hemorrhoids. Genitourinary: Denies dysuria, frequency, urgency, nocturia, hesitancy, discharge, hematuria or flank pain. Musculoskeletal: Denies arthralgias, myalgias, stiffness, jt. swelling, pain, limping or strain/sprain.  Skin: Denies pruritus, rash, hives, warts, acne, eczema or change in skin lesion(s). Neuro: No weakness, tremor, incoordination, spasms, paresthesia or pain. Psychiatric: Denies confusion, memory loss or sensory loss. Endo: Denies change in weight, skin or hair change.  Heme/Lymph: No excessive bleeding, bruising or enlarged lymph nodes.  Physical Exam  BP 136/82   Pulse 68   Temp (!) 97.1 F (36.2 C)   Resp 16   Ht 6\' 3"  (1.905 m)   Wt 218 lb 6.4 oz (99.1 kg)   BMI 27.30 kg/m   Appears well nourished, well groomed  and in no distress.  Eyes: PERRLA, EOMs, conjunctiva no swelling or erythema. Sinuses: No frontal/maxillary tenderness ENT/Mouth: EAC's clear, TM's nl w/o erythema, bulging. Nares clear w/o erythema, swelling, exudates. Oropharynx clear without erythema or exudates. Oral hygiene is good. Tongue normal, non obstructing. Hearing intact.  Neck: Supple. Thyroid nl. Car 2+/2+ without bruits, nodes  or JVD. Chest: Respirations nl with BS clear & equal w/o rales, rhonchi, wheezing or stridor.  Cor: Heart sounds normal w/ regular rate and rhythm without sig. murmurs, gallops, clicks or rubs. Peripheral pulses normal and equal  without edema.  Abdomen: Soft & bowel sounds normal. Non-tender w/o guarding, rebound, hernias, masses or organomegaly.  Lymphatics: Unremarkable.  Musculoskeletal: Full ROM all peripheral extremities, joint stability, 5/5 strength and normal gait.  Skin: Warm, dry without exposed rashes, lesions or ecchymosis apparent.  Neuro: Cranial  nerves intact, reflexes equal bilaterally. Sensory-motor testing grossly intact. Tendon reflexes grossly intact.  Pysch: Alert & oriented x 3.  Insight and judgement nl & appropriate. No ideations.  Assessment and Plan:  1. Primary sclerosing cholangitis  - CBC with Differential/Platelet - COMPLETE METABOLIC PANEL WITH GFR - Protime-INR  2. Abnormal LFTs  - COMPLETE METABOLIC PANEL WITH GFR - Protime-INR  3. Hypothyroidism  - TSH  4. Hyperlipidemia, mixed  - Continue diet/meds, exercise,& lifestyle modifications.  - Continue monitor periodic cholesterol/liver & renal functions    5. Mesenteric vein thrombosis  - back on Eliquis  - Protime-INR  6. MDD (major depressive disorder), recurrent severe, without psychosis ([place])  - escitalopram (LEXAPRO) 20 MG tablet; Take 1 tablet daily for Mood, Anxiety, Irritability, Depression  Dispense: 90 tablet; Refill: 1 - busPIRone (BUSPAR) 10 MG tablet; Take 1 tablet 3 x /day for anxiety  Dispense: 90 tablet; Refill: 5  7. Essential hypertension  - Continue medication, monitor blood pressure at home.  - Continue DASH diet. Reminder to go to the ER if any CP,  SOB, nausea, dizziness, severe HA, changes vision/speech.  - CBC with Differential/Platelet - COMPLETE METABOLIC PANEL WITH GFR - Magnesium  8. Medication management  - CBC with Differential/Platelet - COMPLETE METABOLIC PANEL WITH GFR - Magnesium - TSH - Protime-INR - Phosphorus      Discussed  regular exercise, BP monitoring, weight control to achieve/maintain BMI less than 25 and discussed meds and SE's. Recommended labs to assess and monitor clinical status with further disposition pending results of labs. Over 30 minutes of exam, counseling, chart review was performed.

## 2018-09-04 NOTE — Patient Instructions (Signed)

## 2018-09-05 ENCOUNTER — Other Ambulatory Visit: Payer: Self-pay | Admitting: Internal Medicine

## 2018-09-05 ENCOUNTER — Telehealth: Payer: Self-pay

## 2018-09-05 DIAGNOSIS — E039 Hypothyroidism, unspecified: Secondary | ICD-10-CM

## 2018-09-05 DIAGNOSIS — Z0001 Encounter for general adult medical examination with abnormal findings: Secondary | ICD-10-CM

## 2018-09-05 DIAGNOSIS — K219 Gastro-esophageal reflux disease without esophagitis: Secondary | ICD-10-CM

## 2018-09-05 LAB — COMPLETE METABOLIC PANEL WITH GFR
AG Ratio: 1.6 (calc) (ref 1.0–2.5)
ALBUMIN MSPROF: 4.6 g/dL (ref 3.6–5.1)
ALT: 68 U/L — ABNORMAL HIGH (ref 9–46)
AST: 44 U/L — ABNORMAL HIGH (ref 10–40)
Alkaline phosphatase (APISO): 257 U/L — ABNORMAL HIGH (ref 40–115)
BILIRUBIN TOTAL: 3.2 mg/dL — AB (ref 0.2–1.2)
BUN: 13 mg/dL (ref 7–25)
CHLORIDE: 104 mmol/L (ref 98–110)
CO2: 30 mmol/L (ref 20–32)
CREATININE: 0.9 mg/dL (ref 0.60–1.35)
Calcium: 9.9 mg/dL (ref 8.6–10.3)
GFR, EST AFRICAN AMERICAN: 137 mL/min/{1.73_m2} (ref >=60)
GFR, Est Non African American: 118 mL/min/{1.73_m2} (ref >=60)
GLUCOSE: 89 mg/dL (ref 65–99)
Globulin: 2.9 g/dL (calc) (ref 1.9–3.7)
Potassium: 4.1 mmol/L (ref 3.5–5.3)
Sodium: 141 mmol/L (ref 135–146)
TOTAL PROTEIN: 7.5 g/dL (ref 6.1–8.1)

## 2018-09-05 LAB — CBC WITH DIFFERENTIAL/PLATELET
BASOS ABS: 19 {cells}/uL (ref 0–200)
BASOS PCT: 0.4 %
EOS ABS: 52 {cells}/uL (ref 15–500)
EOS PCT: 1.1 %
HCT: 38.2 % — ABNORMAL LOW (ref 38.5–50.0)
HEMOGLOBIN: 13.2 g/dL (ref 13.2–17.1)
Lymphs Abs: 1152 cells/uL (ref 850–3900)
MCH: 29.9 pg (ref 27.0–33.0)
MCHC: 34.6 g/dL (ref 32.0–36.0)
MCV: 86.4 fL (ref 80.0–100.0)
MPV: 11.2 fL (ref 7.5–12.5)
Monocytes Relative: 7.3 %
NEUTROS PCT: 66.7 %
Neutro Abs: 3135 cells/uL (ref 1500–7800)
Platelets: 158 10*3/uL (ref 140–400)
RBC: 4.42 10*6/uL (ref 4.20–5.80)
RDW: 16.8 % — ABNORMAL HIGH (ref 11.0–15.0)
TOTAL LYMPHOCYTE: 24.5 %
WBC mixed population: 343 cells/uL (ref 200–950)
WBC: 4.7 10*3/uL (ref 3.8–10.8)

## 2018-09-05 LAB — PROTIME-INR
INR: 1
PROTHROMBIN TIME: 10.5 s (ref 9.0–11.5)

## 2018-09-05 LAB — TSH: TSH: 9.07 mIU/L — ABNORMAL HIGH (ref 0.40–4.50)

## 2018-09-05 LAB — PHOSPHORUS: Phosphorus: 3.9 mg/dL (ref 2.5–4.5)

## 2018-09-05 LAB — MAGNESIUM: Magnesium: 1.9 mg/dL (ref 1.5–2.5)

## 2018-09-05 MED ORDER — OMEPRAZOLE 40 MG PO CPDR: DELAYED_RELEASE_CAPSULE | ORAL | 1 refills | Status: DC

## 2018-09-05 MED ORDER — LEVOTHYROXINE SODIUM 100 MCG PO TABS: ORAL_TABLET | ORAL | 1 refills | Status: DC

## 2018-09-05 NOTE — Telephone Encounter (Signed)
Patient has appointment at [place] with Dr. Zollie Scale on 09/06/18.

## 2018-09-13 ENCOUNTER — Other Ambulatory Visit: Payer: Self-pay | Admitting: Hematology

## 2018-09-13 ENCOUNTER — Inpatient Hospital Stay (HOSPITAL_BASED_OUTPATIENT_CLINIC_OR_DEPARTMENT_OTHER): Payer: 59 | Admitting: Hematology

## 2018-09-13 VITALS — BP 129/76 | HR 81 | Temp 98.4°F | Resp 18 | Ht 75.0 in | Wt 222.6 lb

## 2018-09-13 DIAGNOSIS — Z800 Family history of malignant neoplasm of digestive organs: Secondary | ICD-10-CM | POA: Diagnosis not present

## 2018-09-13 DIAGNOSIS — Z720 Tobacco use: Secondary | ICD-10-CM | POA: Diagnosis not present

## 2018-09-13 DIAGNOSIS — Z86718 Personal history of other venous thrombosis and embolism: Secondary | ICD-10-CM

## 2018-09-13 DIAGNOSIS — R760 Raised antibody titer: Secondary | ICD-10-CM | POA: Diagnosis not present

## 2018-09-13 DIAGNOSIS — D6859 Other primary thrombophilia: Secondary | ICD-10-CM

## 2018-09-13 DIAGNOSIS — I81 Portal vein thrombosis: Secondary | ICD-10-CM | POA: Diagnosis present

## 2018-09-16 ENCOUNTER — Telehealth: Payer: Self-pay

## 2018-09-16 NOTE — Telephone Encounter (Signed)
Left a detailed message with patient mother concerning his return visit appointment.  Per 11/29 los mailed a letter with a calender enclosed

## 2018-09-19 ENCOUNTER — Other Ambulatory Visit: Payer: Self-pay

## 2018-09-19 ENCOUNTER — Ambulatory Visit: Payer: Self-pay | Admitting: Gastroenterology

## 2018-09-19 ENCOUNTER — Telehealth: Payer: Self-pay

## 2018-09-19 DIAGNOSIS — K8301 Primary sclerosing cholangitis: Secondary | ICD-10-CM

## 2018-09-19 NOTE — Telephone Encounter (Signed)
Called and spoke to mother of pt.  They apologized for missing this morning's appt.  They went to see Dr. Zollie Scale yesterday and will hope to have his cholecystectomy scheduled in mid December.  She will have him go to the lab one day next week for LFTs; she does not think Dr. Precious Gilding office did any blood work yesterday. Scheduled him for appt with Dr. Havery Moros in January.

## 2018-09-22 NOTE — Progress Notes (Signed)
HEMATOLOGY/ONCOLOGY CLINIC NOTE  Date of Service: .09/13/2018  Patient Care Team: [name], MD as PCP - General (Internal Medicine)  CHIEF COMPLAINTS/PURPOSE OF CONSULTATION:  F/u SMV thrombosis   HISTORY OF PRESENTING ILLNESS:   Kyle Gonzales is a wonderful 27 y.o. male who has been referred to [place] by Dr. 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 He is accompanied today by his mother. The pt reports that he is doing well overall.   The pt presented to the ED on 04/26/18 for significant abdominal pain which increased for several weeks prior. The pain worsened with movement and the pt noted some association with eating. A 04/27/18 CT A/P indicated a SMV thrombosis and the pt was given heparin and discharged with oral Eliquis. He will be having a repeat CT A/P on 05/24/18.  The pt reports that he has never had clots prior to these events. Presently he notes that his abdominal pain has alleviated substantially, but remains slightly. He denies any previous abdominal trauma or abdominal infections. He denies any current bowel abnormalities. He is tolerating regular meals well and is maintaining compliance with Eliquis.   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 He began going to see GI Dr. Havery Moros within the last two years. He is currently not taking any medications. He denies any concerns for Crohn's disease, but prior to his blood clot he notes more frequent bowel movements and denies diarrhea, blood in the stools, and other abnormalities.  He was also previously diagnosed with Grave's disease.   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 2. 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 3. Persistent mild dilatation of small bowel loops  throughout the abdomen with air-fluid levels. Increased mild small bowel wall thickening. No definite pneumatosis. Small bowel ischemia cannot be excluded. No free air. 4. Nonspecific dilatation and mild wall thickening in the appendix, mildly increased. 5. Small volume pelvic ascites. 6. Chronic mild intrahepatic biliary ductal dilatation is unchanged back to 2018 MRI studies. No acute biliary abnormality.   Most recent lab results (05/14/18) of CBC w/diff is as follows: all values are WNL except for WBC at 12.6k, RBC at 4.16, HGB at 12.1, HCT at 36.2, ANC at 10.2k, Monocytes abs at 1.3k. 04/29/18 Antithrombin III was WNL at 88%. 04/29/18 Lupus anticoagulant panel revealed DRVVT at 60.4sec. 04/29/18 Beta-2 Glycoproteins were all normal.  04/29/18 Cardiolipin antibodies showed Anticardiolipin IgM at 18. 04/29/18 dRVVT Mix was slightly elevated at 47.4 04/29/18 dRVVT Confirm was slightly elevated at 1.3 04/29/18 Homocysteine was elevated at 22.4  On review of systems, pt reports slight remaining abdominal pain, and denies diarrhea, blood in the stools, bowel abnormalities, unexpected weight loss, skin rashes, pain along the spine, red/swollen/painful joints, leg pain/swelling, testicular pain/swelling, and any other symptoms.   On Social Hx the pt reports smoking marijuana recreationally and denies ETOH consumption, any other drug use.  On Family Hx the pt reports maternal grandmother with stomach cancer, and denies blood clots.  INTERVAL HISTORY  Patient is here for f/u of his SMV thrombosis. He was recently admitted early Nov 2019 for abnormal LFts, N/V from his [facility] and required ERCP and sphincterotomy. Cytology no malignant cells. Rpt lupus anticoag neg  On 08/16/2018. Protein S activity remains low at 31%   MEDICAL HISTORY:  Past Medical History:  Diagnosis Date  . GERD (gastroesophageal reflux disease)   . Graves disease 08/18/2015  . Hyperlipidemia   . Hypothyroid   . Palpitations   . PSC  (primary sclerosing cholangitis)   . Vitamin D deficiency     SURGICAL HISTORY: Past Surgical History:  Procedure Laterality Date  . ANTERIOR CRUCIATE LIGAMENT REPAIR  2012  . ERCP N/A 08/19/2018   Procedure: ENDOSCOPIC RETROGRADE CHOLANGIOPANCREATOGRAPHY (ERCP);  Surgeon: Milus Banister, MD;  Location: [facility];  Service: Endoscopy;  Laterality: N/A;  . REMOVAL OF STONES  08/19/2018   Procedure: REMOVAL OF STONES;  Surgeon: Milus Banister, MD;  Location: [facility];  Service: Endoscopy;;  . Joan Mayans  08/19/2018   Procedure: Joan Mayans;  Surgeon: Milus Banister, MD;  Location: [facility];  Service: Endoscopy;;  . WISDOM TOOTH EXTRACTION      SOCIAL HISTORY: Social History   Socioeconomic History  . Marital status: Single    Spouse name: Not on file  . Number of children: Not on file  . Years of education: Not on file  . Highest education level: Not on file  Occupational History  . Not on file  Social Needs  . Financial resource strain: Not on file  . Food insecurity:    Worry: Not on file    Inability: Not on file  . Transportation needs:    Medical: Not on file    Non-medical: Not on file  Tobacco Use  . Smoking status: Current Some Day Smoker  . Smokeless tobacco: Never Used  . Tobacco comment: Marijuana  Substance and Sexual Activity  . Alcohol use: No  . Drug use: Yes    Types: Marijuana  . Sexual activity: Yes  Lifestyle  . Physical activity:    Days per week: Not on file    Minutes per session: Not on file  . Stress: Not on file  Relationships  . Social connections:    Talks on phone: Not on file    Gets together: Not on file    Attends religious service: Not on file    Active member of club or organization: Not on file    Attends meetings of clubs or organizations: Not on file    Relationship status: Not on file  . Intimate partner violence:    Fear of current or ex partner: Not on file    Emotionally abused: Not on file     Physically abused: Not on file    Forced sexual activity: Not on file  Other Topics Concern  . Not on file  Social History Narrative  . Not on file    FAMILY HISTORY: Family History  Problem Relation Age of Onset  . Diabetes Mother   . Colon cancer Maternal Grandmother   . Stomach cancer Maternal Grandmother   . Diabetes Maternal Grandmother   . Stomach cancer Maternal Grandfather   . Diabetes Maternal Grandfather     ALLERGIES:  is allergic to tylenol [acetaminophen].  MEDICATIONS:  Current Outpatient Medications  Medication Sig Dispense Refill  . alum & mag hydroxide-simeth (MAALOX ADVANCED MAX ST) 400-400-40 MG/5ML suspension Take 15 mLs by mouth every 6 (six) hours as needed for indigestion. 355 mL 0  . bisoprolol-hydrochlorothiazide (ZIAC) 10-6.25 MG tablet Take 1 tablet by mouth daily. 90 tablet 1  . busPIRone (BUSPAR) 10 MG tablet Take 1 tablet 3 x /day for anxiety 90 tablet 5  . Cholecalciferol (VITAMIN D3) 5000 units CAPS Take 1 capsule (5,000 Units  total) by mouth every other day.    Marland Kitchen ELIQUIS 5 MG TABS tablet TAKE 1 TABLET(5 MG) BY MOUTH TWICE DAILY 60 tablet 0  . escitalopram (LEXAPRO) 20 MG tablet Take 1 tablet daily for Mood, Anxiety, Irritability, Depression 90 tablet 1  . ezetimibe (ZETIA) 10 MG tablet TAKE 1 TABLET BY MOUTH ONCE DAILY FOR CHOLESTEROL (Patient taking differently: Take 10 mg by mouth daily. ) 90 tablet 0  . hydrOXYzine (ATARAX/VISTARIL) 25 MG tablet Take 1 tablet (25 mg total) by mouth 3 (three) times daily as needed for anxiety. 30 tablet 0  . levothyroxine (SYNTHROID) 100 MCG tablet Take 1 tablet 1st in the morning on an empty stomach with ONLY water for 30 minutes and no Antacids for 4 hours 90 tablet 1  . nystatin (MYCOSTATIN) 100000 UNIT/ML suspension Take 5 mLs (500,000 Units total) by mouth 4 (four) times daily. 60 mL 0  . omeprazole (PRILOSEC) 40 MG capsule Take daily at Bedtime for Acid Indigestion and Reflux. 90 capsule 1  . ondansetron  (ZOFRAN) 8 MG tablet Take 1 tablet 3 x /day before meals for Nausea 60 tablet 0   No current facility-administered medications for this visit.     REVIEW OF SYSTEMS:   .10 Point review of Systems was done is negative except as noted above.   PHYSICAL EXAMINATION:   . Vitals:   09/13/18 1231  BP: 129/76  Pulse: 81  Resp: 18  Temp: 98.4 F (36.9 C)  SpO2: 100%   Filed Weights   09/13/18 1231  Weight: 222 lb 9.6 oz (101 kg)   .Body mass index is 27.82 kg/m.  Marland Kitchen GENERAL:alert, in no acute distress and comfortable SKIN: no acute rashes, no significant lesions EYES: conjunctiva are pink and non-injected, sclera anicteric OROPHARYNX: MMM, no exudates, no oropharyngeal erythema or ulceration NECK: supple, no JVD LYMPH:  no palpable lymphadenopathy in the cervical, axillary or inguinal regions LUNGS: clear to auscultation b/l with normal respiratory effort HEART: regular rate & rhythm ABDOMEN:  normoactive bowel sounds , non tender, not distended. Extremity: no pedal edema PSYCH: alert & oriented x 3 with fluent speech NEURO: no focal motor/sensory deficits   LABORATORY DATA:  I have reviewed the data as listed  . CBC Latest Ref Rng & Units 09/04/2018 08/21/2018 08/17/2018  WBC 3.8 - 10.8 Thousand/uL 4.7 4.3 4.3  Hemoglobin 13.2 - 17.1 g/dL 13.2 12.1(L) 12.1(L)  Hematocrit 38.5 - 50.0 % 38.2(L) 37.3(L) 37.4(L)  Platelets 140 - 400 Thousand/uL 158 124(L) 147(L)    . CMP Latest Ref Rng & Units 09/04/2018 08/21/2018 08/20/2018  Glucose 65 - 99 mg/dL 89 105(H) 116(H)  BUN 7 - 25 mg/dL 13 8 10   Creatinine 0.60 - 1.35 mg/dL 0.90 0.93 0.81  Sodium 135 - 146 mmol/L 141 138 136  Potassium 3.5 - 5.3 mmol/L 4.1 3.7 3.5  Chloride 98 - 110 mmol/L 104 100 100  CO2 20 - 32 mmol/L 30 26 23   Calcium 8.6 - 10.3 mg/dL 9.9 9.3 9.5  Total Protein 6.1 - 8.1 g/dL 7.5 7.2 7.9  Total Bilirubin 0.2 - 1.2 mg/dL 3.2(H) 9.1(H) 12.0(H)  Alkaline Phos 38 - 126 U/L - 492(H) 528(H)  AST 10 - 40  U/L 44(H) 321(H) 367(H)  ALT 9 - 46 U/L 68(H) 533(H) 596(H)   Component     Latest Ref Rng & Units 08/16/2018  PTT Lupus Anticoagulant     0.0 - 51.9 sec 38.3  DRVVT     0.0 - 47.0 sec  38.8  Lupus Anticoag Interp      Comment:  Anticardiolipin Ab,IgG,Qn     0 - 14 GPL U/mL <9  Anticardiolipin Ab,IgM,Qn     0 - 12 MPL U/mL 14 (H)  Anticardiolipin Ab,IgA,Qn     0 - 11 APL U/mL <9  Protein S, Total     60 - 150 % 63  Protein S, Free     57 - 157 % 30 (L)  Prothrombin Time     11.4 - 15.2 seconds 11.9  INR      0.88  Protein S-Functional     63 - 140 % 31 (L)  APTT     24 - 36 seconds 29    RADIOGRAPHIC STUDIES: I have personally reviewed the radiological images as listed and agreed with the findings in the report. No results found.  ASSESSMENT & PLAN:  27 y.o. male with  1. 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 3. PSC with recent abnormal LFTs needinf ERCP and sphincterotomy. Cytology neg for malignancy 4. Low Protein S activity levels. PLAN -Discussed patient's most recent labs from 08/16/2018 -Lupus anticoagulant was positive on 04/29/18 but repeat on 08/16/2018 - negative off Eliquis -Factor V, Prothrombin both negative -Protein S was low in the setting of active blood clot but remains low suggesting possible acquired Protein S deficiency that would serve as  An ongoing risk factor for recurrent venous thrombo-embolism. -local hepatobiliary inflammation from [facility] is an addition risk factor. -would be recommend long term Eliquis given ongoing risk factors as noted above.  RTC with Dr Irene Limbo in 4 months with labs . Orders Placed This Encounter  Procedures  . CBC with Differential/Platelet    Standing Status:   Future    Standing Expiration Date:   10/18/2019  . CMP ([facility] only)    Standing Status:   Future    Standing Expiration Date:   09/14/2019  . Protime-INR    Standing  Status:   Future    Standing Expiration Date:   09/14/2019     All of the patients questions were answered with apparent satisfaction. The patient knows to call the clinic with any problems, questions or concerns. . The total time spent in the appointment was 25 minutes and more than 50% was on counseling and direct patient cares.      Sullivan Lone MD [place] AAHIVMS [facility] Hematology/Oncology Physician [facility]  (Office):       [phone] (Work cell):  [phone] (Fax):           [phone]

## 2018-09-25 ENCOUNTER — Ambulatory Visit: Payer: Self-pay | Admitting: Nurse Practitioner

## 2018-10-02 ENCOUNTER — Ambulatory Visit (HOSPITAL_COMMUNITY): Payer: Self-pay | Admitting: Psychiatry

## 2018-10-10 ENCOUNTER — Telehealth: Payer: Self-pay | Admitting: *Deleted

## 2018-10-10 ENCOUNTER — Ambulatory Visit: Payer: Self-pay | Admitting: Internal Medicine

## 2018-10-10 NOTE — Telephone Encounter (Signed)
Mother called to state patient is having gall bladder surgery on 10/17/18. She asked how long before he surgery he should stop his Eliquis.  Per Dr. Irene Limbo, stop Eliquis 2 days prior to surgery and resume when Surgeon gives permission. Advised her to contact office for further questions or concerns. She can also follow up with PCP or surgeon for questions in Dr. Grier Mitts absence (Dr. Irene Limbo out of town until day after surgery). Attempted to contact mother - left Voice Mail with information provided by Dr. Irene Limbo and encouraged her to contact office.

## 2018-10-13 ENCOUNTER — Other Ambulatory Visit: Payer: Self-pay | Admitting: Hematology

## 2018-10-22 ENCOUNTER — Ambulatory Visit: Payer: Self-pay | Admitting: Gastroenterology

## 2018-10-25 ENCOUNTER — Ambulatory Visit: Payer: Self-pay | Admitting: Family Medicine

## 2018-10-26 ENCOUNTER — Other Ambulatory Visit: Payer: Self-pay | Admitting: Physician Assistant

## 2018-10-26 DIAGNOSIS — K219 Gastro-esophageal reflux disease without esophagitis: Secondary | ICD-10-CM

## 2018-10-29 ENCOUNTER — Telehealth: Payer: Self-pay

## 2018-10-29 NOTE — Telephone Encounter (Signed)
Called and spoke to pt's mother.  Reminded them of his appt with Dr. Havery Moros on Friday, 11-01-18 at 1:30pm.  Also asked him to go to the lab before Friday to have labs done that were ordered previously but not completed.  She expressed understanding and will try to get him to the lab this week and confirmed he will come to his appt on Friday.

## 2018-10-30 ENCOUNTER — Encounter: Payer: Self-pay | Admitting: Internal Medicine

## 2018-10-30 ENCOUNTER — Other Ambulatory Visit: Payer: Self-pay | Admitting: Internal Medicine

## 2018-10-30 DIAGNOSIS — K219 Gastro-esophageal reflux disease without esophagitis: Secondary | ICD-10-CM

## 2018-10-30 NOTE — Progress Notes (Signed)
         C  A  N  C  E  L  E  D   A  T       A  P  P  ' T       T  I  M  E                                                                                                                                                             The patient is a 28 yo single BM with hx/o HTN, HLD, Graves Dz  S/p RAI ablation & Vit D Deficiency.        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 He also has hx/o  Depression and Illicit drug use.  He has scheduled f/u w/Dr Havery Moros tomorrow.  He was scheduled to have Cholecystectomy on 10/17/2018.

## 2018-10-31 ENCOUNTER — Ambulatory Visit: Payer: Self-pay | Admitting: Internal Medicine

## 2018-10-31 DIAGNOSIS — I10 Essential (primary) hypertension: Secondary | ICD-10-CM

## 2018-11-01 ENCOUNTER — Other Ambulatory Visit (INDEPENDENT_AMBULATORY_CARE_PROVIDER_SITE_OTHER): Payer: Self-pay

## 2018-11-01 ENCOUNTER — Encounter: Payer: Self-pay | Admitting: Gastroenterology

## 2018-11-01 ENCOUNTER — Ambulatory Visit (INDEPENDENT_AMBULATORY_CARE_PROVIDER_SITE_OTHER): Payer: Self-pay | Admitting: Gastroenterology

## 2018-11-01 VITALS — BP 132/70 | HR 70 | Ht 75.0 in | Wt 252.4 lb

## 2018-11-01 DIAGNOSIS — K8301 Primary sclerosing cholangitis: Secondary | ICD-10-CM

## 2018-11-01 DIAGNOSIS — K831 Obstruction of bile duct: Secondary | ICD-10-CM

## 2018-11-01 LAB — HEPATIC FUNCTION PANEL
ALBUMIN: 4.2 g/dL (ref 3.5–5.2)
ALK PHOS: 55 U/L (ref 39–117)
ALT: 21 U/L (ref 0–53)
AST: 20 U/L (ref 0–37)
BILIRUBIN DIRECT: 0.2 mg/dL (ref 0.0–0.3)
TOTAL PROTEIN: 7.2 g/dL (ref 6.0–8.3)
Total Bilirubin: 0.7 mg/dL (ref 0.2–1.2)

## 2018-11-01 NOTE — Progress Notes (Signed)
HPI :  28 y/o male here for a follow up visit. 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 He's had a complicated course over the past year. 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 Placed on Eliquis at that time. He was readmitted in November with jaundice and underwent MRCP with subsequent ERCP. He had intrahepatic dilation with a stricture noted, brushed and dilated. He also had gallstones noted and was referred to general surgery for cholecystectomy which he underwent. CA 19-9 was elevated at the time of hospitalization. AFP normal. Brushings of the stricture were benign. He has since seen his Hepatologist Dr. Zollie Scale a few times, most recently a few days ago.   In general he states is been doing really well since his hospitalization. He had his cholecystectomy one to 2 weeks ago and has been recovering. He has some appropriate incisional soreness however overall recovering okay. Surgery was done in [place]. Eating fine, no nausea or vomiting. He denies any jaundice. Denies any pruritus. He's been using his omeprazole for reflux and its working welll. His bowels are okay without any diarrhea or blood in the stools. He states he is compliant with Eliquis.  Colonoscopy 06/25/2017 - normal colon and ileum, no evidence of IBD.   Past Medical History:  Diagnosis Date  . GERD (gastroesophageal reflux disease)   . Graves disease 08/18/2015  . Hyperlipidemia   . Hypothyroid   . Palpitations   . PSC (primary sclerosing cholangitis)   . Thrombosis    superior mesenteric vein - 2019  . Vitamin D deficiency      Past Surgical History:  Procedure Laterality Date  . ANTERIOR CRUCIATE LIGAMENT REPAIR  2012  . ERCP N/A 08/19/2018   Procedure: ENDOSCOPIC RETROGRADE CHOLANGIOPANCREATOGRAPHY (ERCP);  Surgeon: Milus Banister, MD;  Location: [facility];  Service: Endoscopy;  Laterality: N/A;  . REMOVAL OF STONES  08/19/2018   Procedure: REMOVAL OF STONES;  Surgeon: Milus Banister, MD;  Location: [facility];  Service: Endoscopy;;  . Joan Mayans  08/19/2018   Procedure: Joan Mayans;  Surgeon: Milus Banister, MD;  Location: [facility];  Service: Endoscopy;;  . WISDOM TOOTH EXTRACTION     Family History  Problem Relation Age of Onset  . Diabetes Mother   . Colon cancer Maternal Grandmother   . Stomach cancer Maternal Grandmother   . Diabetes Maternal Grandmother   . Stomach cancer Maternal Grandfather   . Diabetes Maternal Grandfather    Social History   Tobacco Use  . Smoking status: Current Some Day Smoker  . Smokeless tobacco: Never Used  . Tobacco comment: Marijuana  Substance Use Topics  . Alcohol use: No  . Drug use: Yes    Types: Marijuana   Current Outpatient Medications  Medication Sig Dispense Refill  . alum & mag hydroxide-simeth (MAALOX ADVANCED MAX ST) 400-400-40 MG/5ML suspension Take 15 mLs by mouth every 6 (six) hours as needed for indigestion. 355 mL 0  . bisoprolol-hydrochlorothiazide (ZIAC) 10-6.25 MG tablet Take 1 tablet by mouth daily. 90 tablet 1  . busPIRone (BUSPAR) 10 MG tablet Take 1 tablet 3 x /day for anxiety 90 tablet 5  . Cholecalciferol (VITAMIN D3) 5000 units CAPS Take 1 capsule (5,000 Units total) by mouth every other day.    Marland Kitchen ELIQUIS 5 MG TABS tablet TAKE 1 TABLET(5 MG) BY MOUTH TWICE DAILY 60 tablet 0  . escitalopram (LEXAPRO) 20 MG tablet  Take 1 tablet daily for Mood, Anxiety, Irritability, Depression 90 tablet 1  . ezetimibe (ZETIA) 10 MG tablet TAKE 1 TABLET BY MOUTH ONCE DAILY FOR CHOLESTEROL (Patient taking differently: Take 10 mg by mouth daily. ) 90 tablet 0  . levothyroxine (SYNTHROID) 100 MCG tablet Take 1 tablet 1st in the morning on an empty stomach with ONLY water for 30 minutes and no Antacids for 4 hours 90 tablet 1  . omeprazole (PRILOSEC) 40 MG capsule TAKE 1 CAPSULE(40 MG) BY MOUTH DAILY BEFORE BREAKFAST 30 capsule 1  . ondansetron (ZOFRAN)  8 MG tablet Take 1 tablet 3 x /day before meals for Nausea 60 tablet 0   No current facility-administered medications for this visit.    Allergies  Allergen Reactions  . Tylenol [Acetaminophen] Other (See Comments)    r/t elevated LFTs      Review of Systems: All systems reviewed and negative except where noted in HPI.    No results found.  Physical Exam: BP 132/70   Pulse 70   Ht 6\' 3"  (1.905 m)   Wt 252 lb 6.4 oz (114.5 kg)   SpO2 97%   BMI 31.55 kg/m  Constitutional: Pleasant,well-developed, male in no acute distress. HEENT: Normocephalic and atraumatic. Conjunctivae are normal. No scleral icterus. Neck supple.  Cardiovascular: Normal rate, regular rhythm.  Pulmonary/chest: Effort normal and breath sounds normal. No wheezing, rales or rhonchi. Abdominal: Soft, nondistended, nontender. Surgical sites healing c/d/iThere are no masses palpable. No hepatomegaly. Extremities: no edema Lymphadenopathy: No cervical adenopathy noted. Neurological: Alert and oriented to person place and time. Skin: Skin is warm and dry. No rashes noted. Psychiatric: Normal mood and affect. Behavior is normal.   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 He unfortunately developed an intrahepatic stricture as above a few months ago, this was brushed and negative cytology. Treated with balloon dilation per Dr. Ardis Hughs. He subsequently underwent cholecystectomy. Overall much better lately. We'll recheck his LFTs today to ensure stable. His colonoscopy is up-to-date and he has no evidence of IBD. He continues to follow closely with Dr. Zollie Scale of Hepatology. Will need colonoscopy 3-5 years from his last exam to rescreen for IBD unless he develops symptoms sooner. Otherwise continue present regimen, will check labs periodically, follow  up with Dr. Zollie Scale every 6 months and me as needed. He agreed.  [place] Cellar, MD [facility]

## 2018-11-14 ENCOUNTER — Telehealth: Payer: Self-pay | Admitting: *Deleted

## 2018-11-14 NOTE — Telephone Encounter (Signed)
Mother called - requesting assistance with patient's Eliquis. Stated Dr. Grier Mitts office gave her a purple card for $10.00 copays in late summer, but the pharmacy informed her it has run out. Only has enough medicine to last through next Tuesday 11/19/2018. Refills available at pharmacy, but cannot afford. Patient does not currently have insurance, so Eliquis will be over $600.00.  Advised mother that I will contact [place] and financial advocates to investigate assistance options. They will contact her as appropriate.  Mother verbalized appreciation and understanding.

## 2018-11-15 ENCOUNTER — Encounter: Payer: Self-pay | Admitting: Hematology

## 2018-11-15 NOTE — Progress Notes (Signed)
Faxed completed Patient Assistance application to [place] for Eliquis on 11/14/18.  I will notify the pt's mother of the outcome once received.

## 2018-11-16 ENCOUNTER — Other Ambulatory Visit: Payer: Self-pay | Admitting: Adult Health

## 2018-11-19 ENCOUNTER — Encounter: Payer: Self-pay | Admitting: Hematology

## 2018-11-19 NOTE — Progress Notes (Signed)
Pt is approved w/ [employer] to receive Eliquis free of charge from 11/18/18 through 11/19/19.

## 2018-12-06 ENCOUNTER — Ambulatory Visit: Payer: Self-pay | Admitting: Internal Medicine

## 2018-12-12 ENCOUNTER — Ambulatory Visit: Payer: Self-pay | Admitting: Physician Assistant

## 2018-12-18 NOTE — Progress Notes (Deleted)
FOLLOW UP  Assessment and Plan:     Continue diet and meds as discussed. Further disposition pending results of labs. Discussed med's effects and SE's.   Over 30 minutes of exam, counseling, chart review, and critical decision making was performed.   Future Appointments  Date Time Provider [place]  12/19/2018  3:45 PM Vicie Mutters, PA-C [facility] None  01/10/2019 10:45 AM [facility] LAB 1 [facility] None  01/10/2019 11:20 AM Brunetta Genera, MD [facility] None  03/05/2019  3:45 PM [name], MD [facility] None    ----------------------------------------------------------------------------------------------------------------------  HPI 28 y.o. male  presents for 3 month follow up on hypertension, cholesterol, vitamin D deficiency, hypothyroidism (Grave's), major depression.   He also has  diagnosis of small duct PSC, with normal liver biopsy.  He is currently being followed by Dr. Zollie Scale /hepatology [facility].  He is being following by Dr. Havery Moros, has had colonoscopy and will need repeat in 3-5 years monitoring for IBD.  Patient had a actue SMV thrombus, hypercoag workup showed lupus anticoagulant, he is on Eliquis and following with Dr. Irene Limbo.  Eating fine, no nausea or vomiting. He denies any jaundice. Denies any pruritus He chronically smokes marijuana.   He has major depression and has been started on zoloft 100 mg daily; was following with psych.   He does not check BP at home, today their BP is    He does not workout. He denies chest pain, shortness of breath, dizziness.   He is on cholesterol medication (zetia 10 mg daily) and denies myalgias. His cholesterol is at goal. The cholesterol last visit was:   Lab Results  Component Value Date   CHOL 178 06/05/2018   HDL 49 06/05/2018   LDLCALC 110 (H) 06/05/2018   TRIG 93 06/05/2018   CHOLHDL 3.6 06/05/2018   Last A1C in the office was:  Lab Results  Component Value Date    HGBA1C 5.5 02/04/2018   He is on thyroid medication. His medication was not changed last visit; takes 1st thing in the morning with water, doesn't eat till lunch.    Lab Results  Component Value Date   TSH 9.07 (H) 09/04/2018   Patient is on Vitamin D supplement.   Lab Results  Component Value Date   VD25OH 67 02/04/2018        Current Medications:  Current Outpatient Medications on File Prior to Visit  Medication Sig  . alum & mag hydroxide-simeth (MAALOX ADVANCED MAX ST) 400-400-40 MG/5ML suspension Take 15 mLs by mouth every 6 (six) hours as needed for indigestion.  . bisoprolol-hydrochlorothiazide (ZIAC) 10-6.25 MG tablet TAKE 1 TABLET BY MOUTH DAILY  . busPIRone (BUSPAR) 10 MG tablet Take 1 tablet 3 x /day for anxiety  . Cholecalciferol (VITAMIN D3) 5000 units CAPS Take 1 capsule (5,000 Units total) by mouth every other day.  Marland Kitchen ELIQUIS 5 MG TABS tablet TAKE 1 TABLET(5 MG) BY MOUTH TWICE DAILY  . escitalopram (LEXAPRO) 20 MG tablet Take 1 tablet daily for Mood, Anxiety, Irritability, Depression  . ezetimibe (ZETIA) 10 MG tablet TAKE 1 TABLET BY MOUTH ONCE DAILY FOR CHOLESTEROL (Patient taking differently: Take 10 mg by mouth daily. )  . levothyroxine (SYNTHROID) 100 MCG tablet Take 1 tablet 1st in the morning on an empty stomach with ONLY water for 30 minutes and no Antacids for 4 hours  . omeprazole (PRILOSEC) 40 MG capsule TAKE 1 CAPSULE(40 MG) BY MOUTH DAILY BEFORE BREAKFAST  . ondansetron (ZOFRAN) 8 MG tablet Take 1  tablet 3 x /day before meals for Nausea   No current facility-administered medications on file prior to visit.      Allergies:  Allergies  Allergen Reactions  . Tylenol [Acetaminophen] Other (See Comments)    r/t elevated LFTs      Medical History:  Past Medical History:  Diagnosis Date  . GERD (gastroesophageal reflux disease)   . Graves disease 08/18/2015  . Hyperlipidemia   . Hypothyroid   . Palpitations   . PSC (primary sclerosing cholangitis)    . Thrombosis    superior mesenteric vein - 2019  . Vitamin D deficiency    Family history- Reviewed and unchanged Social history- Reviewed and unchanged   Review of Systems:  Review of Systems  Constitutional: Negative for malaise/fatigue and weight loss.  HENT: Negative for hearing loss and tinnitus.   Eyes: Negative for blurred vision and double vision.  Respiratory: Negative for cough, shortness of breath and wheezing.   Cardiovascular: Negative for chest pain, palpitations, orthopnea, claudication and leg swelling.  Gastrointestinal: Negative for abdominal pain, blood in stool, constipation, diarrhea, heartburn, melena, nausea and vomiting.  Genitourinary: Negative.   Musculoskeletal: Negative for joint pain and myalgias.  Skin: Negative for rash.  Neurological: Negative for dizziness, tingling, sensory change, weakness and headaches.  Endo/Heme/Allergies: Negative for polydipsia.  Psychiatric/Behavioral: Positive for depression and substance abuse. Negative for hallucinations and suicidal ideas. The patient is not nervous/anxious and does not have insomnia.   All other systems reviewed and are negative.   Physical Exam: There were no vitals taken for this visit. Wt Readings from Last 3 Encounters:  11/01/18 252 lb 6.4 oz (114.5 kg)  09/13/18 222 lb 9.6 oz (101 kg)  09/04/18 218 lb 6.4 oz (99.1 kg)   General Appearance: Well nourished, appears to be feeling unwell, lying back on exam table, in no acute distress. Eyes: PERRLA, EOMs, conjunctiva no swelling or erythema Sinuses: No Frontal/maxillary tenderness ENT/Mouth: Ext aud canals clear, TMs without erythema, bulging. No erythema, swelling, or exudate on post pharynx.  Tonsils not swollen or erythematous. Hearing normal.  Neck: Supple, thyroid normal.  Respiratory: Respiratory effort normal, BS equal bilaterally without rales, rhonchi, wheezing or stridor.  Cardio: RRR with no MRGs. Brisk peripheral pulses without edema.   Abdomen: Soft, + BS.  Vague generalized mild tenderness, no guarding, rebound, hernias, masses. Lymphatics: Non tender without lymphadenopathy.  Musculoskeletal: Full ROM, 5/5 strength, Normal gait Skin: Warm, dry without rashes, lesions, ecchymosis.  Neuro: Cranial nerves intact. No cerebellar symptoms.  Psych: Awake and oriented X 3, depressed affect, Insight and Judgment appropriate.    Vicie Mutters, PA-C 10:56 AM [facility]

## 2018-12-19 ENCOUNTER — Ambulatory Visit: Payer: Self-pay | Admitting: Physician Assistant

## 2018-12-20 ENCOUNTER — Other Ambulatory Visit: Payer: Self-pay | Admitting: Adult Health

## 2018-12-31 ENCOUNTER — Other Ambulatory Visit: Payer: Self-pay

## 2018-12-31 DIAGNOSIS — R112 Nausea with vomiting, unspecified: Secondary | ICD-10-CM

## 2018-12-31 MED ORDER — ONDANSETRON HCL 8 MG PO TABS: ORAL_TABLET | ORAL | 0 refills | Status: DC

## 2019-01-01 ENCOUNTER — Other Ambulatory Visit: Payer: Self-pay | Admitting: *Deleted

## 2019-01-01 DIAGNOSIS — R112 Nausea with vomiting, unspecified: Secondary | ICD-10-CM

## 2019-01-01 MED ORDER — ONDANSETRON HCL 8 MG PO TABS: ORAL_TABLET | ORAL | 0 refills | Status: DC

## 2019-01-02 ENCOUNTER — Other Ambulatory Visit: Payer: Self-pay | Admitting: Internal Medicine

## 2019-01-02 DIAGNOSIS — I10 Essential (primary) hypertension: Secondary | ICD-10-CM

## 2019-01-02 DIAGNOSIS — K219 Gastro-esophageal reflux disease without esophagitis: Secondary | ICD-10-CM

## 2019-01-02 MED ORDER — BISOPROLOL-HYDROCHLOROTHIAZIDE 10-6.25 MG PO TABS: ORAL_TABLET | ORAL | 0 refills | Status: DC

## 2019-01-08 ENCOUNTER — Ambulatory Visit (INDEPENDENT_AMBULATORY_CARE_PROVIDER_SITE_OTHER): Payer: Self-pay | Admitting: Physician Assistant

## 2019-01-08 ENCOUNTER — Other Ambulatory Visit: Payer: Self-pay | Admitting: *Deleted

## 2019-01-08 ENCOUNTER — Other Ambulatory Visit: Payer: Self-pay

## 2019-01-08 VITALS — BP 122/76 | HR 84 | Temp 97.2°F | Resp 18 | Ht 75.0 in | Wt 252.0 lb

## 2019-01-08 DIAGNOSIS — R112 Nausea with vomiting, unspecified: Secondary | ICD-10-CM

## 2019-01-08 DIAGNOSIS — E039 Hypothyroidism, unspecified: Secondary | ICD-10-CM

## 2019-01-08 DIAGNOSIS — E782 Mixed hyperlipidemia: Secondary | ICD-10-CM

## 2019-01-08 DIAGNOSIS — F332 Major depressive disorder, recurrent severe without psychotic features: Secondary | ICD-10-CM

## 2019-01-08 DIAGNOSIS — K8301 Primary sclerosing cholangitis: Secondary | ICD-10-CM

## 2019-01-08 DIAGNOSIS — K219 Gastro-esophageal reflux disease without esophagitis: Secondary | ICD-10-CM

## 2019-01-08 DIAGNOSIS — I10 Essential (primary) hypertension: Secondary | ICD-10-CM

## 2019-01-08 DIAGNOSIS — F1920 Other psychoactive substance dependence, uncomplicated: Secondary | ICD-10-CM

## 2019-01-08 DIAGNOSIS — R945 Abnormal results of liver function studies: Secondary | ICD-10-CM

## 2019-01-08 DIAGNOSIS — R7989 Other specified abnormal findings of blood chemistry: Secondary | ICD-10-CM

## 2019-01-08 MED ORDER — EZETIMIBE 10 MG PO TABS: ORAL_TABLET | ORAL | 0 refills | Status: DC

## 2019-01-08 MED ORDER — BISOPROLOL-HYDROCHLOROTHIAZIDE 10-6.25 MG PO TABS: ORAL_TABLET | ORAL | 0 refills | Status: DC

## 2019-01-08 MED ORDER — ONDANSETRON HCL 8 MG PO TABS: ORAL_TABLET | ORAL | 0 refills | Status: DC

## 2019-01-08 MED ORDER — LEVOTHYROXINE SODIUM 100 MCG PO TABS: ORAL_TABLET | ORAL | 0 refills | Status: DC

## 2019-01-08 MED ORDER — OMEPRAZOLE 40 MG PO CPDR: DELAYED_RELEASE_CAPSULE | ORAL | 0 refills | Status: DC

## 2019-01-08 NOTE — Progress Notes (Signed)
FOLLOW UP- patient is self pay  Assessment and Plan:   Essential hypertension - continue medications, DASH diet, exercise and monitor at home. Call if greater than 130/80.  -     COMPLETE METABOLIC PANEL WITH GFR -     CBC with Differential/Platelet  PSC (primary sclerosing cholangitis) Continue follow up  LFTs monitor Avoid alcohol/tylenol  Hypothyroidism, unspecified type -     TSH Hypothyroidism-check TSH level, continue medications the same, reminded to take on an empty stomach 30-61mins before food.   Drug addiction / Marajuana ([place]) Continue to decrease use  Hyperlipidemia, mixed .lanchol  Abnormal LFTs -     COMPLETE METABOLIC PANEL WITH GFR  MDD (major depressive disorder), recurrent severe, without psychosis ([place]) Counseled to take the lexapro daily, not as needed    Continue diet and meds as discussed. Further disposition pending results of labs. Discussed med's effects and SE's.   Over 30 minutes of exam, counseling, chart review, and critical decision making was performed.   Future Appointments  Date Time Provider [place]  01/10/2019 10:45 AM [facility] 1 [facility] None  01/10/2019 11:20 AM Brunetta Genera, MD [facility] None  03/05/2019  3:45 PM [name], MD [facility] None  03/14/2019 10:30 AM Fulp, Cammie, MD [facility] None    ----------------------------------------------------------------------------------------------------------------------  HPI 28 y.o. male  presents for 3 month follow up on hypertension, cholesterol, vitamin D deficiency, hypothyroidism (Grave's), major depression.   He also has  diagnosis of small duct PSC, with normal liver biopsy.  He is currently being followed by Dr. Zollie Scale /hepatology [facility].  He is being following by Dr. Havery Moros, has had colonoscopy and will need repeat in 3-5 years monitoring for IBD.  Patient had a actue SMV thrombus, hypercoag workup showed lupus  anticoagulant, he is on Eliquis and following with Dr. Irene Limbo.  Eating fine, no nausea or vomiting. He denies any jaundice. Denies any pruritus Lab Results  Component Value Date   ALT 21 11/01/2018   AST 20 11/01/2018   ALKPHOS 55 11/01/2018   BILITOT 0.7 11/01/2018   He chronically smokes marijuana, he is trying to cut back.   He has major depression and has been started on zoloft 100 mg daily; was following with psych, he is on lexpaor now but taking AS needed  He does not check BP at home, today their BP is BP: 122/76  He does not workout. He denies chest pain, shortness of breath, dizziness.   He is on cholesterol medication (zetia 10 mg daily) and denies myalgias. His cholesterol is at goal. The cholesterol last visit was:   Lab Results  Component Value Date   CHOL 178 06/05/2018   HDL 49 06/05/2018   LDLCALC 110 (H) 06/05/2018   TRIG 93 06/05/2018   CHOLHDL 3.6 06/05/2018   Last A1C in the office was:  Lab Results  Component Value Date   HGBA1C 5.5 02/04/2018   He is on thyroid medication. His medication was changed last visit to 171mcg daily; takes 1st thing in the morning with water, doesn't eat till lunch.    Lab Results  Component Value Date   TSH 9.07 (H) 09/04/2018   Patient is on Vitamin D supplement.   Lab Results  Component Value Date   VD25OH 67 02/04/2018        Current Medications:  Current Outpatient Medications on File Prior to Visit  Medication Sig  . alum & mag hydroxide-simeth (MAALOX ADVANCED MAX ST) 400-400-40 MG/5ML suspension Take  15 mLs by mouth every 6 (six) hours as needed for indigestion.  . busPIRone (BUSPAR) 10 MG tablet Take 1 tablet 3 x /day for anxiety  . Cholecalciferol (VITAMIN D3) 5000 units CAPS Take 1 capsule (5,000 Units total) by mouth every other day.  Marland Kitchen ELIQUIS 5 MG TABS tablet TAKE 1 TABLET(5 MG) BY MOUTH TWICE DAILY  . escitalopram (LEXAPRO) 20 MG tablet Take 1 tablet daily for Mood, Anxiety, Irritability, Depression    No current facility-administered medications on file prior to visit.      Allergies:  Allergies  Allergen Reactions  . Tylenol [Acetaminophen] Other (See Comments)    r/t elevated LFTs      Medical History:  Past Medical History:  Diagnosis Date  . GERD (gastroesophageal reflux disease)   . Graves disease 08/18/2015  . Hyperlipidemia   . Hypothyroid   . Palpitations   . PSC (primary sclerosing cholangitis)   . Thrombosis    superior mesenteric vein - 2019  . Vitamin D deficiency    Family history- Reviewed and unchanged Social history- Reviewed and unchanged   Review of Systems:  Review of Systems  Constitutional: Negative for malaise/fatigue and weight loss.  HENT: Negative for hearing loss and tinnitus.   Eyes: Negative for blurred vision and double vision.  Respiratory: Negative for cough, shortness of breath and wheezing.   Cardiovascular: Negative for chest pain, palpitations, orthopnea, claudication and leg swelling.  Gastrointestinal: Negative for abdominal pain, blood in stool, constipation, diarrhea, heartburn, melena, nausea and vomiting.  Genitourinary: Negative.   Musculoskeletal: Negative for joint pain and myalgias.  Skin: Negative for rash.  Neurological: Negative for dizziness, tingling, sensory change, weakness and headaches.  Endo/Heme/Allergies: Negative for polydipsia.  Psychiatric/Behavioral: Positive for depression and substance abuse. Negative for hallucinations and suicidal ideas. The patient is not nervous/anxious and does not have insomnia.   All other systems reviewed and are negative.   Physical Exam: BP 122/76   Pulse 84   Temp (!) 97.2 F (36.2 C)   Resp 18   Ht 6\' 3"  (1.905 m)   Wt 252 lb (114.3 kg)   BMI 31.50 kg/m  Wt Readings from Last 3 Encounters:  01/08/19 252 lb (114.3 kg)  11/01/18 252 lb 6.4 oz (114.5 kg)  09/13/18 222 lb 9.6 oz (101 kg)   General Appearance: Well nourished, appears to be feeling unwell, lying  back on exam table, in no acute distress. Eyes: PERRLA, EOMs, conjunctiva no swelling or erythema Sinuses: No Frontal/maxillary tenderness ENT/Mouth: Ext aud canals clear, TMs without erythema, bulging. No erythema, swelling, or exudate on post pharynx.  Tonsils not swollen or erythematous. Hearing normal.  Neck: Supple, thyroid normal.  Respiratory: Respiratory effort normal, BS equal bilaterally without rales, rhonchi, wheezing or stridor.  Cardio: RRR with no MRGs. Brisk peripheral pulses without edema.  Abdomen: Soft, + BS.  Vague generalized mild tenderness, no guarding, rebound, hernias, masses. Lymphatics: Non tender without lymphadenopathy.  Musculoskeletal: Full ROM, 5/5 strength, Normal gait Skin: Warm, dry without rashes, lesions, ecchymosis.  Neuro: Cranial nerves intact. No cerebellar symptoms.  Psych: Awake and oriented X 3, depressed affect, Insight and Judgment appropriate.    Vicie Mutters, PA-C 4:17 PM [facility]

## 2019-01-08 NOTE — Patient Instructions (Addendum)
Need to take the lexapro or escitalopram DAILY for it to be effective.  Buspar or buspirone AS needed for anxiety  See if you snore at night or what goes on there

## 2019-01-09 ENCOUNTER — Telehealth: Payer: Self-pay | Admitting: *Deleted

## 2019-01-09 LAB — CBC WITH DIFFERENTIAL/PLATELET
Absolute Monocytes: 365 cells/uL (ref 200–950)
BASOS ABS: 21 {cells}/uL (ref 0–200)
Basophils Relative: 0.5 %
EOS ABS: 29 {cells}/uL (ref 15–500)
Eosinophils Relative: 0.7 %
HEMATOCRIT: 43.6 % (ref 38.5–50.0)
Hemoglobin: 14.8 g/dL (ref 13.2–17.1)
LYMPHS ABS: 923 {cells}/uL (ref 850–3900)
MCH: 30 pg (ref 27.0–33.0)
MCHC: 33.9 g/dL (ref 32.0–36.0)
MCV: 88.4 fL (ref 80.0–100.0)
MPV: 12.1 fL (ref 7.5–12.5)
Monocytes Relative: 8.9 %
NEUTROS PCT: 67.4 %
Neutro Abs: 2763 cells/uL (ref 1500–7800)
PLATELETS: 117 10*3/uL — AB (ref 140–400)
RBC: 4.93 10*6/uL (ref 4.20–5.80)
RDW: 13.7 % (ref 11.0–15.0)
TOTAL LYMPHOCYTE: 22.5 %
WBC: 4.1 10*3/uL (ref 3.8–10.8)

## 2019-01-09 LAB — COMPLETE METABOLIC PANEL WITH GFR
AG RATIO: 1.7 (calc) (ref 1.0–2.5)
ALT: 197 U/L — AB (ref 9–46)
AST: 79 U/L — AB (ref 10–40)
Albumin: 4.8 g/dL (ref 3.6–5.1)
Alkaline phosphatase (APISO): 120 U/L (ref 36–130)
BUN: 11 mg/dL (ref 7–25)
CALCIUM: 9.8 mg/dL (ref 8.6–10.3)
CO2: 27 mmol/L (ref 20–32)
CREATININE: 1.07 mg/dL (ref 0.60–1.35)
Chloride: 103 mmol/L (ref 98–110)
GFR, Est African American: 110 mL/min/{1.73_m2} (ref >=60)
GFR, Est Non African American: 95 mL/min/{1.73_m2} (ref >=60)
GLOBULIN: 2.8 g/dL (ref 1.9–3.7)
Glucose, Bld: 100 mg/dL — ABNORMAL HIGH (ref 65–99)
POTASSIUM: 3.7 mmol/L (ref 3.5–5.3)
SODIUM: 140 mmol/L (ref 135–146)
Total Bilirubin: 1 mg/dL (ref 0.2–1.2)
Total Protein: 7.6 g/dL (ref 6.1–8.1)

## 2019-01-09 LAB — TSH: TSH: 5.39 m[IU]/L — AB (ref 0.40–4.50)

## 2019-01-09 NOTE — Telephone Encounter (Signed)
Attempted to contact patient - left msg at [phone] home#: Per Dr. Irene Limbo, to reduce travel and exposure at this time, patient's appts for tomorrow are cancelled as patient has been stable on past visits. Patient is encouraged to f/u with PCP Dr. Melford Aase for continued anticoagulant therapy. Encouraged to contact office at [phone] for questions or if medication needed to bridge appt to PCP.

## 2019-01-10 ENCOUNTER — Other Ambulatory Visit: Payer: Self-pay

## 2019-01-10 ENCOUNTER — Ambulatory Visit: Payer: Self-pay | Admitting: Hematology

## 2019-01-13 ENCOUNTER — Other Ambulatory Visit: Payer: Self-pay | Admitting: *Deleted

## 2019-01-13 ENCOUNTER — Other Ambulatory Visit: Payer: Self-pay | Admitting: Internal Medicine

## 2019-01-13 ENCOUNTER — Telehealth: Payer: Self-pay | Admitting: *Deleted

## 2019-01-13 MED ORDER — APIXABAN 5 MG PO TABS: ORAL_TABLET | ORAL | 0 refills | Status: AC

## 2019-01-13 MED ORDER — APIXABAN 5 MG PO TABS: ORAL_TABLET | ORAL | 0 refills | Status: DC

## 2019-01-13 NOTE — Telephone Encounter (Signed)
Patient's mother called and requested a written RX for Eliquis be sent to [facility]. An RX for Eliquis 5 mg # 180 faxed to [phone].

## 2019-02-03 ENCOUNTER — Encounter: Payer: Self-pay | Admitting: Internal Medicine

## 2019-03-05 ENCOUNTER — Encounter: Payer: Self-pay | Admitting: Internal Medicine

## 2019-03-05 IMAGING — MR MR 3D RECON AT [facility]
20 of 21 series · 20 of 21 positions shown · IV contrast (gadavist)
Comparison: MRI 04/26/2018.

CLINICAL DATA: Pain and abnormal liver function test. History of
sclerosing cholangitis.

EXAM:
MRI ABDOMEN WITHOUT AND WITH CONTRAST (INCLUDING MRCP)
TECHNIQUE: Multiplanar multisequence MR imaging of the abdomen was performed
both before and after the administration of intravenous contrast.
Heavily T2-weighted images of the biliary and pancreatic ducts were
obtained, and three-dimensional MRCP images were rendered by post
processing.
CONTRAST:  10 cc Gadavist

[Series 4: T2 fat-sat · axial · 5.0mm · 0.86mm/px · 1 of 58 slices shown]
[im 1/58]
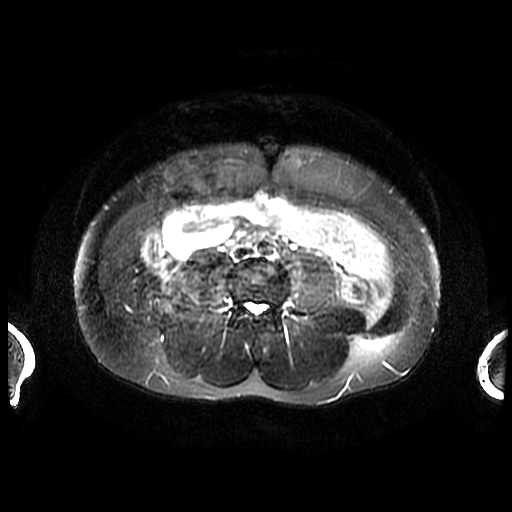

[Series 5: DWI b500 · axial · 6.0mm · 1.76mm/px · 1 of 78 slices shown]
[im 1/78]
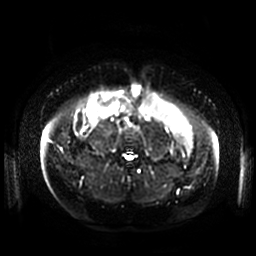

[Series 8: T2 · axial · 5.0mm · 0.86mm/px · 1 of 58 slices shown]
[im 1/58]
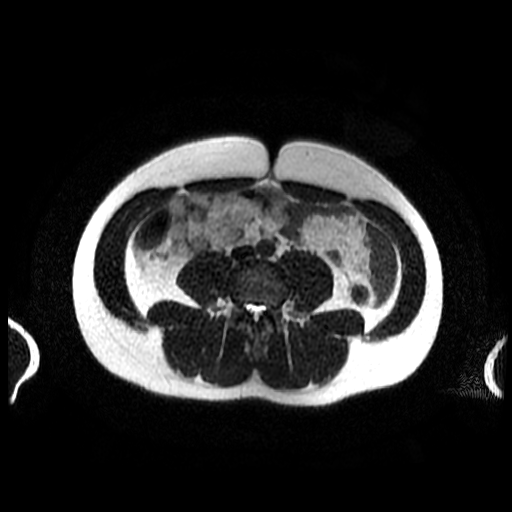

[Series 9: MRCP · coronal · 2.0mm · 0.70mm/px · 1 of 60 slices shown (1 of 3)]
[im 1/60]
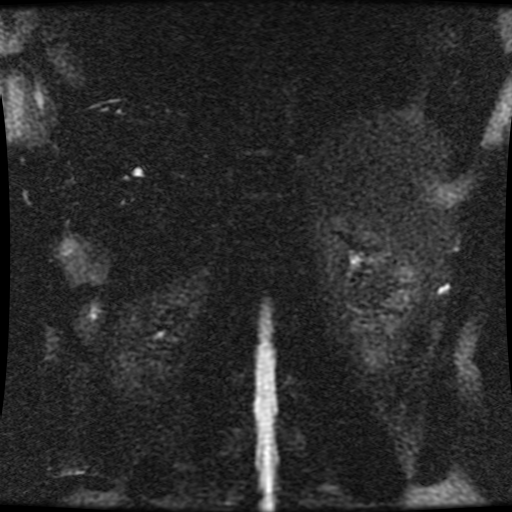

[Series 10: MRCP · coronal · 1.6mm · 0.62mm/px · 1 of 153 slices shown (2 of 3)]
[im 1/153]
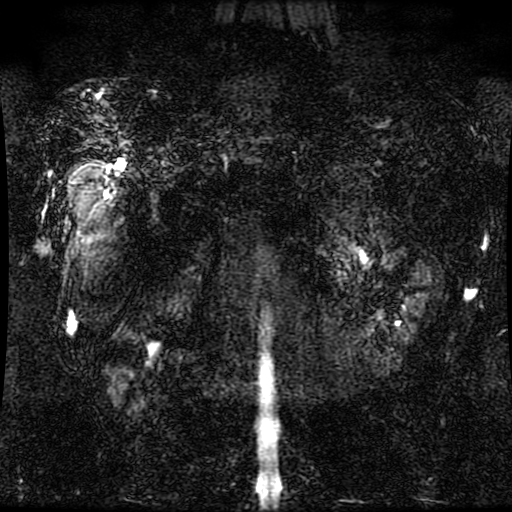

[Series 11: ax dualecho · axial · 5.0mm · 0.86mm/px · 1 of 116 slices shown]
[im 1/116]
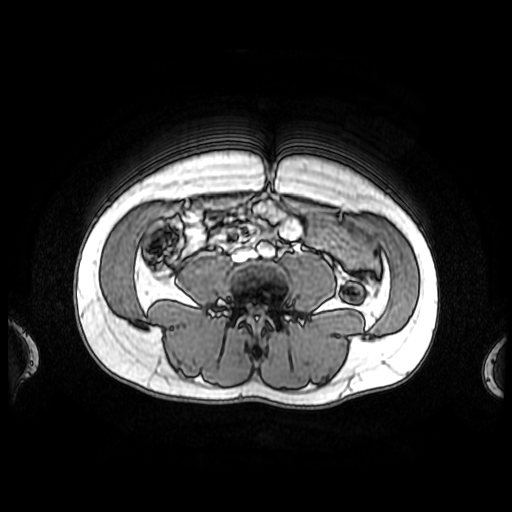

[Series 12: MRCP · coronal · 40.0mm · 0.74mm/px · 1 of 9 slices shown (3 of 3)]
[im 1/9]
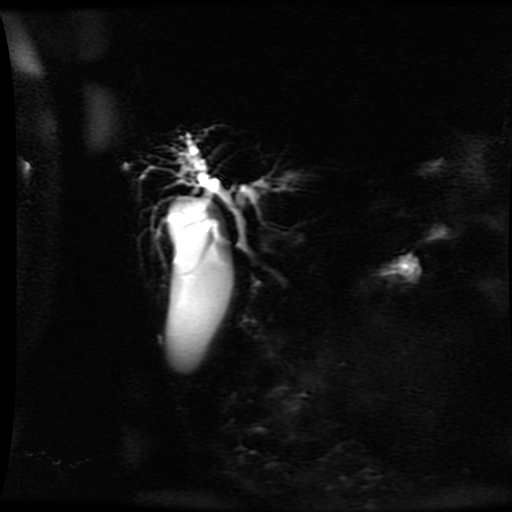

[Series 14: T1 dynamic · coronal · delayed · 4.4mm · 0.78mm/px · 1 of 112 slices shown (1 of 6)]
[im 1/112]
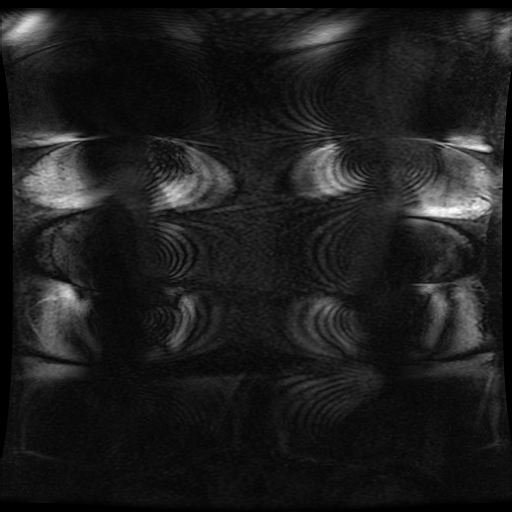

[Series 500: DWI · axial · 6.0mm · 1.76mm/px · 1 of 39 slices shown]
[im 1/39]
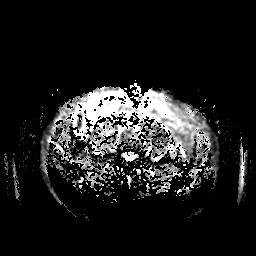

[Series 1000: reformatted · axial · 1.6mm · 0.62mm/px · 1 of 93 slices shown]
[im 1/93]
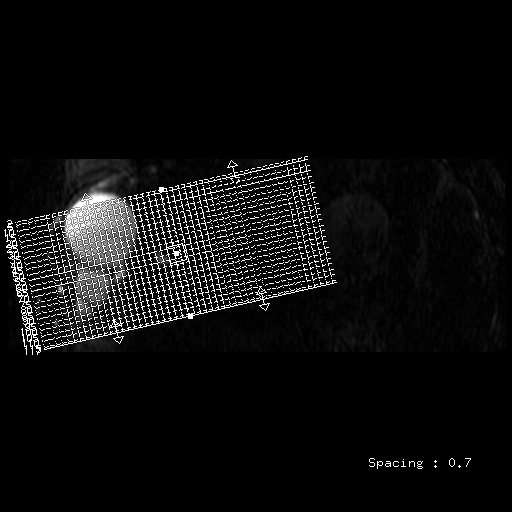

[Series 1301: T1 dynamic · axial · 5.8mm · 0.78mm/px · 1 of 96 slices shown (2 of 6)]
[im 1/96]
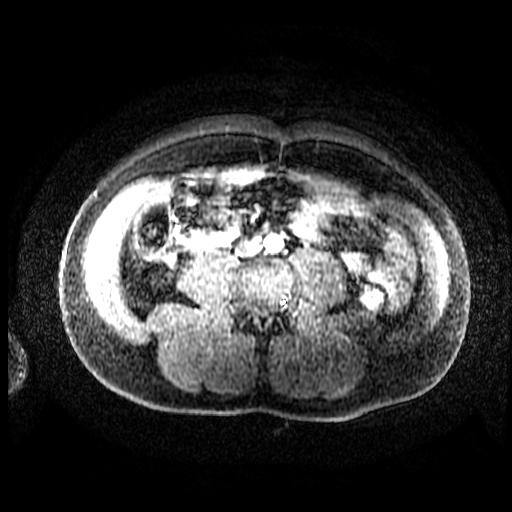

[Series 1302: T1 dynamic · axial · 5.8mm · 0.78mm/px · 1 of 96 slices shown (3 of 6)]
[im 1/96]
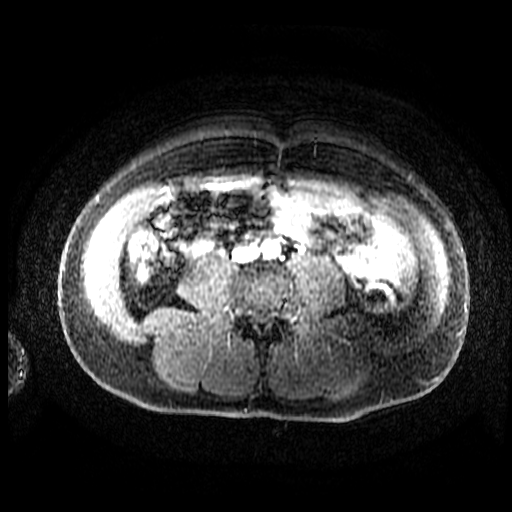

[Series 1303: T1 dynamic · axial · 5.8mm · 0.78mm/px · 1 of 96 slices shown (4 of 6)]
[im 1/96]
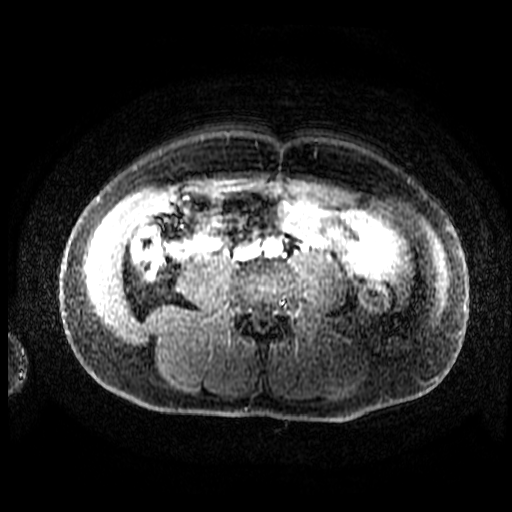

[Series 1304: T1 dynamic · axial · 5.8mm · 0.78mm/px · 1 of 96 slices shown (5 of 6)]
[im 1/96]
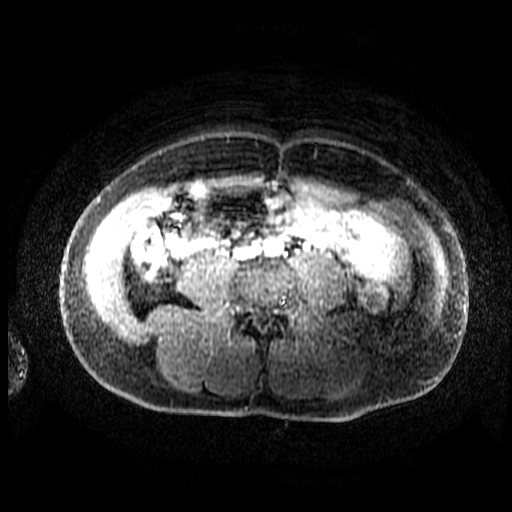

[Series 1305: T1 dynamic · axial · 5.8mm · 0.78mm/px · 1 of 96 slices shown (6 of 6)]
[im 1/96]
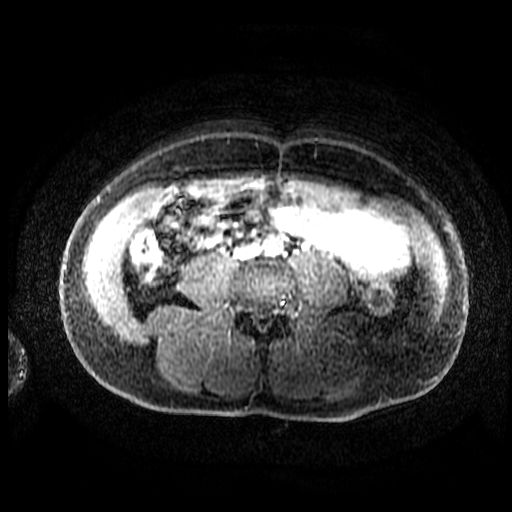

[((id)/(id)/1)-((id)/(id)/1) · axial · 5.8mm · 0.78mm/px · 1 of 96 slices shown (1 of 5)]
[im 1/96]
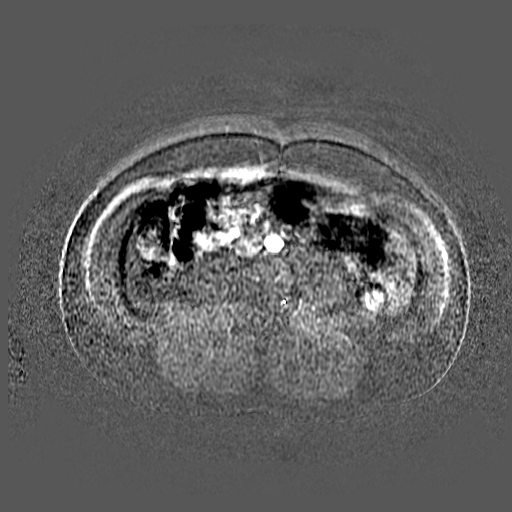

[((id)/(id)/1)-((id)/(id)/1) · axial · 5.8mm · 0.78mm/px · 1 of 96 slices shown (2 of 5)]
[im 1/96]
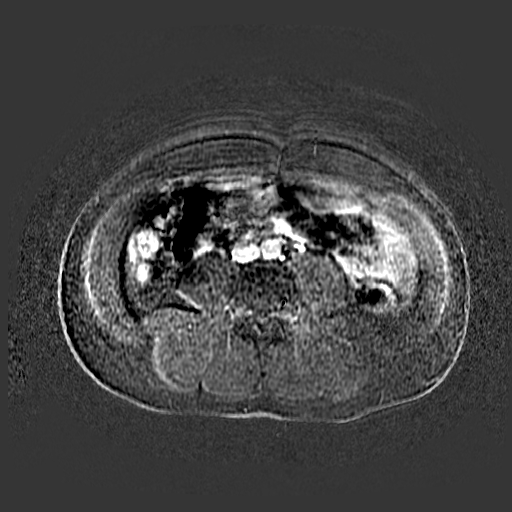

[((id)/(id)/1)-((id)/(id)/1) · axial · 5.8mm · 0.78mm/px · 1 of 96 slices shown (3 of 5)]
[im 1/96]
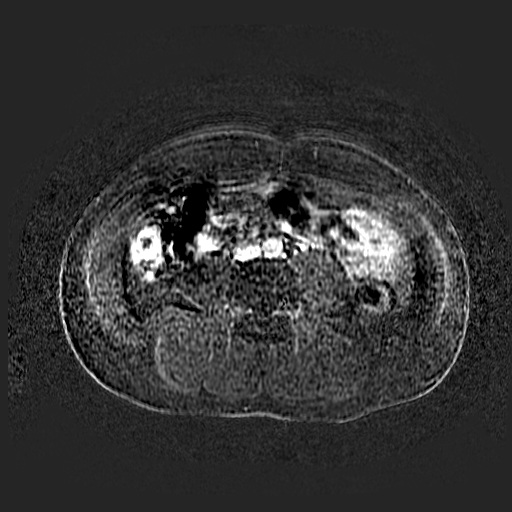

[((id)/(id)/1)-((id)/(id)/1) · axial · 5.8mm · 0.78mm/px · 1 of 96 slices shown (4 of 5)]
[im 1/96]
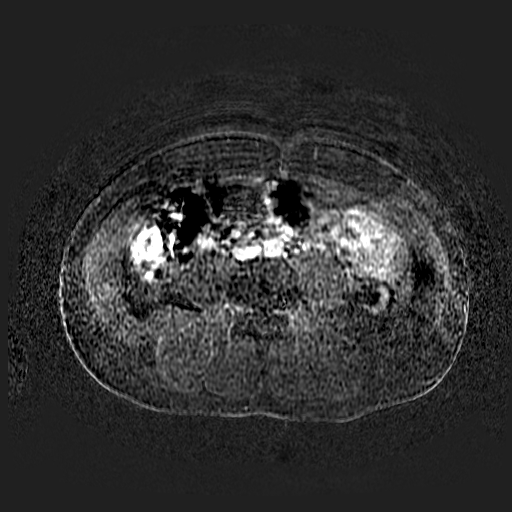

[((id)/(id)/1)-((id)/(id)/1) · axial · 5.8mm · 0.78mm/px · 1 of 96 slices shown (5 of 5)]
[im 1/96]
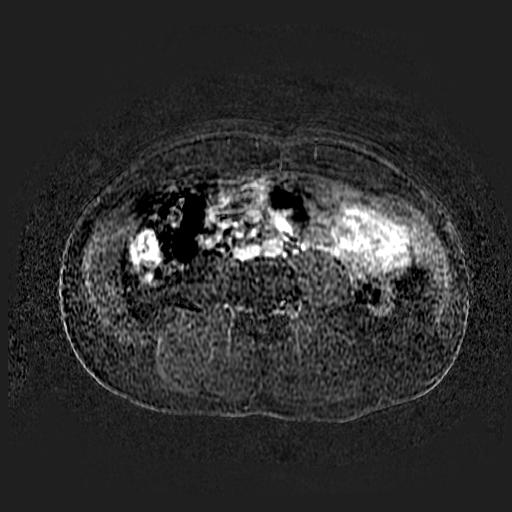

[20 of 21 positions shown; findings below may reference images not displayed]

FINDINGS: Lower chest: No acute findings.

Hepatobiliary: There is marked intrahepatic bile duct dilatation
which appears progressive compared with previous exam. Normal
caliber of the common bile duct. Diffuse gallbladder distension is
again noted. Sludge and/or tiny stones stones. Unchanged appearance
of hemangiomas within the right lobe of liver, image [DATE]
and image [id number].

Pancreas: No mass, inflammatory changes, or other parenchymal
abnormality identified.

Spleen:  Within normal limits in size and appearance.

Adrenals/Urinary Tract: Normal appearance of the adrenal glands.
Kidneys are unremarkable.

Stomach/Bowel: Visualized portions within the abdomen are
unremarkable.

Vascular/Lymphatic: Normal appearance of the abdominal aorta.
Considerable motion artifact diminishes mesenteric vascular detail.
There is a diminutive superior mesenteric vein. The splenic vein
remains patent. Chronic extrahepatic occlusion of the portal vein
with cavernous transformation of the portal vein noted.

No adenopathy identified.

Other:  No ascites.

Musculoskeletal: No suspicious bone lesions identified.
IMPRESSION: 1. There is been interval progression of intrahepatic biliary ductal
dilatation without common bile duct dilatation. Findings are
compatible with history of primary sclerosing cholangitis.
2. Small amount of gallbladder sludge or tiny stones noted.
3. Cavernous transformation of the portal vein as noted previously.
The superior mesenteric vein appears diminutive.
4. Right lobe of liver hemangiomas.

## 2019-03-11 ENCOUNTER — Encounter: Payer: Self-pay | Admitting: Internal Medicine

## 2019-03-14 ENCOUNTER — Ambulatory Visit: Payer: Self-pay | Admitting: Family Medicine

## 2019-03-14 ENCOUNTER — Other Ambulatory Visit: Payer: Self-pay | Admitting: Internal Medicine

## 2019-03-14 DIAGNOSIS — F332 Major depressive disorder, recurrent severe without psychotic features: Secondary | ICD-10-CM

## 2019-03-18 ENCOUNTER — Telehealth: Payer: Self-pay

## 2019-03-18 NOTE — Telephone Encounter (Signed)
Mother notified and agrees to picking up a fecal occult card. Advised per provider to continue the Eliquis. Will monitor for dark/black stools or any abdominal pain and if that happens to stop taking the medication and will need to be evaluated.

## 2019-03-18 NOTE — Telephone Encounter (Signed)
Mother calling on behalf of patient. States that there is blood in patient's stool. Currently taking Eliquis, 5mg , BID and questioning whether or not he should stop that or not. Only has happened once today and the color was bright red. Please advise.

## 2019-04-10 ENCOUNTER — Other Ambulatory Visit: Payer: Self-pay | Admitting: Physician Assistant

## 2019-04-10 DIAGNOSIS — F332 Major depressive disorder, recurrent severe without psychotic features: Secondary | ICD-10-CM

## 2019-04-11 ENCOUNTER — Encounter: Payer: Self-pay | Admitting: Internal Medicine

## 2019-04-14 ENCOUNTER — Encounter: Payer: Self-pay | Admitting: Internal Medicine

## 2019-04-14 ENCOUNTER — Other Ambulatory Visit: Payer: Self-pay | Admitting: *Deleted

## 2019-04-14 DIAGNOSIS — E039 Hypothyroidism, unspecified: Secondary | ICD-10-CM

## 2019-04-14 MED ORDER — LEVOTHYROXINE SODIUM 100 MCG PO TABS: ORAL_TABLET | ORAL | 0 refills | Status: DC

## 2019-04-14 NOTE — Progress Notes (Signed)
[facility]  [name], M.D.        Uvaldo Bristle. Silverio Lay, P.A.-C         Liane Comber, Arma                [place] [address] Telephone [phone] Telefax [phone]  Office Visit     This very nice 28 y.o. single BM followed for HTN, HLD, Hypothyroidism (Graves Dz),  Prediabetes, Depression and Vitamin D Deficiency. Patient also has hx/o  Marijuana addiction. He's been seen in the past  by Dr Norman Clay (Psychiatry) for Depression.     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  Then more recently he was hospitalized in Nov 2019 and had ERCP w/sphincterotomy by Dr Ardis Hughs. His regular GI consultant is Dr Havery Moros & he's also been seen by Dr Zollie Scale  at the [facility].      HTN predates since Feb 2016. Patient's BP has been controlled at home.  Today's BP is at goal - 124/76. Patient denies any cardiac symptoms as chest pain, palpitations, shortness of breath, dizziness or ankle swelling.     Patient's hyperlipidemia is not controlled with diet and Zetia.  (Statins are avoided due to his underlying liver disease).  Patient denies myalgias or other medication SE's. Last lipids were not at goal: Lab Results  Component Value Date   CHOL 178 06/05/2018   HDL 49 06/05/2018   LDLCALC 110 (H) 06/05/2018   TRIG 93 06/05/2018   CHOLHDL 3.6 06/05/2018      Patient has Morbid Obesity (BMI 31+) and is followed expectantly for glucose intolerance and patient denies reactive hypoglycemic symptoms, visual blurring, diabetic polys or paresthesias. Last A1c was Normal & at goal: Lab Results  Component Value Date   HGBA1C 5.5 02/04/2018       Patient has hx/o  Graves' Dz & in Nov 2016 he was treated with RAI-131   And subsequently  started on replacement in Feb 2017.      Finally, patient has history of Vitamin D Deficiency  ("7" / 2016)  and last vitamin D was at goal:  Lab Results  Component Value Date   VD25OH 67 02/04/2018   Current Outpatient Medications on File Prior to Visit  Medication Sig  . alum & mag hydroxide-simeth (MAALOX ADVANCED MAX ST) 400-400-40 MG/5ML suspension Take 15 mLs by mouth every 6 (six) hours as needed for indigestion.  Marland Kitchen apixaban (ELIQUIS) 5 MG TABS tablet TAKE 1 TABLET(5 MG) BY MOUTH TWICE DAILY  . bisoprolol-hydrochlorothiazide (ZIAC) 10-6.25 MG tablet Take 1 tablet daily for BP  . busPIRone (BUSPAR) 10 MG tablet Take 1 tablet 3 x /day for anxiety  . Cholecalciferol (VITAMIN D3) 5000 units CAPS Take 1 capsule (5,000 Units total) by mouth every other day.  . escitalopram (LEXAPRO) 20 MG tablet TAKE 1 TABLET BY MOUTH EVERY DAY FOR MOOD, ANXIETY, IRRITABILITY AND DEPRESSION  . ezetimibe (ZETIA) 10 MG tablet TAKE 1 TABLET BY MOUTH ONCE DAILY FOR CHOLESTEROL  . levothyroxine (SYNTHROID) 100 MCG tablet Take 1 tablet 1st in the morning on an empty stomach with ONLY water for 30 minutes and no Antacids for 4 hours  . omeprazole (PRILOSEC) 40  MG capsule TAKE 1 CAPSULE BY MOUTH EVERY MORNING BEFORE BREAKFAST  . ondansetron (ZOFRAN) 8 MG tablet Take 1 tablet 3 x /day before meals for Nausea   No current facility-administered medications on file prior to visit.    Allergies  Allergen Reactions  . Tylenol [Acetaminophen] Other (See Comments)    r/t elevated LFTs    Past Medical History:  Diagnosis Date  . GERD (gastroesophageal reflux disease)   . Graves disease 08/18/2015  . Hyperlipidemia   . Hypothyroid   . Palpitations   . PSC (primary sclerosing cholangitis)   . Thrombosis    superior mesenteric vein - 2019  . Vitamin D deficiency    Health Maintenance  Topic Date Due  . TETANUS/TDAP  10/16/2018  . INFLUENZA VACCINE  05/17/2019  . COLONOSCOPY  06/25/2020  .  HIV Screening  Completed   Immunization History  Administered Date(s) Administered  . PPD Test 12/06/2015  . Td 10/16/2008   Last Colon - 06/25/2017 - Dr Havery Moros - Normal - recc 3-5 yr f/u due to Co-morbid [place].  Past Surgical History:  Procedure Laterality Date  . ANTERIOR CRUCIATE LIGAMENT REPAIR  2012  . ERCP N/A 08/19/2018   Procedure: ENDOSCOPIC RETROGRADE CHOLANGIOPANCREATOGRAPHY (ERCP);  Surgeon: Milus Banister, MD;  Location: [facility];  Service: Endoscopy;  Laterality: N/A;  . REMOVAL OF STONES  08/19/2018   Procedure: REMOVAL OF STONES;  Surgeon: Milus Banister, MD;  Location: [facility];  Service: Endoscopy;;  . Joan Mayans  08/19/2018   Procedure: Joan Mayans;  Surgeon: Milus Banister, MD;  Location: [facility];  Service: Endoscopy;;  . WISDOM TOOTH EXTRACTION     Family History  Problem Relation Age of Onset  . Diabetes Mother   . Colon cancer Maternal Grandmother   . Stomach cancer Maternal Grandmother   . Diabetes Maternal Grandmother   . Stomach cancer Maternal Grandfather   . Diabetes Maternal Grandfather    Social History   Socioeconomic History  . Marital status: Single  Occupational History  . Working in [employer]"  ([employer] ? )  Tobacco Use  . Smoking status: Current Some Day Smoker  . Smokeless tobacco: Never Used  . Tobacco comment: Marijuana  Substance and Sexual Activity  . Alcohol use: No  . Drug use: Yes    Types: Marijuana usu 2 - 3 x /daily  . Sexual activity: Yes    ROS Constitutional: Denies fever, chills, weight loss/gain, headaches, insomnia,  night sweats or change in appetite. Does c/o fatigue. Eyes: Denies redness, blurred vision, diplopia, discharge, itchy or watery eyes.  ENT: Denies discharge, congestion, post nasal drip, epistaxis, sore throat, earache, hearing loss, dental pain, Tinnitus, Vertigo, Sinus pain or snoring.  Cardio: Denies chest pain, palpitations, irregular heartbeat, syncope, dyspnea,  diaphoresis, orthopnea, PND, claudication or edema Respiratory: denies cough, dyspnea, DOE, pleurisy, hoarseness, laryngitis or wheezing.  Gastrointestinal: Denies dysphagia, heartburn, reflux, water brash, pain, cramps, nausea, vomiting, bloating, diarrhea, constipation, hematemesis, melena, hematochezia, jaundice or hemorrhoids Genitourinary: Denies dysuria, frequency, urgency, nocturia, hesitancy, discharge, hematuria or flank pain Musculoskeletal: Denies arthralgia, myalgia, stiffness, Jt. Swelling, pain, limp or strain/sprain. Denies Falls. Skin: Denies puritis, rash, hives, warts, acne, eczema or change in skin lesion Neuro: No weakness, tremor, incoordination, spasms, paresthesia or pain Psychiatric: Denies confusion, memory loss or sensory loss. Denies Depression. Endocrine: Denies change in weight, skin, hair change, nocturia, and paresthesia, diabetic polys, visual blurring or hyper / hypo glycemic episodes.  Heme/Lymph: No excessive bleeding, bruising or enlarged  lymph nodes.  Physical Exam  BP 124/76   Pulse 76   Temp (!) 97 F (36.1 C)   Resp 18   Ht 6\' 3"  (1.905 m)   Wt 249 lb (112.9 kg)   BMI 31.12 kg/m   General Appearance: Well nourished and well groomed and in no apparent distress.  Eyes: PERRLA, EOMs, conjunctiva no swelling or erythema, normal fundi and vessels. Sinuses: No frontal/maxillary tenderness ENT/Mouth: EACs patent / TMs  nl. Nares clear without erythema, swelling, mucoid exudates. Oral hygiene is good. No erythema, swelling, or exudate. Tongue normal, non-obstructing. Tonsils not swollen or erythematous. Hearing normal.  Neck: Supple, thyroid not palpable. No bruits, nodes or JVD. Respiratory: Respiratory effort normal.  BS equal and clear bilateral without rales, rhonci, wheezing or stridor. Cardio: Heart sounds are normal with regular rate and rhythm and no murmurs, rubs or gallops. Peripheral pulses are normal and equal bilaterally without edema. No  aortic or femoral bruits. Chest: symmetric with normal excursions and percussion.  Abdomen: Soft, with Nl bowel sounds. Nontender, no guarding, rebound, hernias, masses, or organomegaly.  Lymphatics: Non tender without lymphadenopathy.  Musculoskeletal: Full ROM all peripheral extremities, joint stability, 5/5 strength, and normal gait. Skin: Warm and dry without rashes, lesions, cyanosis, clubbing or  ecchymosis.  Neuro: Cranial nerves intact, reflexes equal bilaterally. Normal muscle tone, no cerebellar symptoms. Sensation intact.  Pysch: Alert and oriented X 3 with normal affect, insight and judgment appropriate.   Assessment and Plan  1. Essential hypertension  - CBC with Differential/Platelet - TSH  2. Hyperlipidemia, mixed  3. Hypothyroidism  - TSH  4. Primary sclerosing cholangitis  5. Mesenteric vein thrombosis  6. Lupus anticoagulant syndrome (HCC)  7. Medication management  - CBC with Differential/Platelet - TSH      Patient was counseled in prudent diet, weight control to achieve/maintain BMI less than 25, BP monitoring, regular exercise and medications as discussed.  Discussed med effects and SE's. Labs are limited today due to lack of insurance at patient's request. Over 12minutes of exam, counseling, chart review and high complex critical decision making was performed   Kirtland Bouchard, MD

## 2019-04-14 NOTE — Patient Instructions (Signed)
- Vit D  And Vit C 1,000 mg  are recommended to help protect  against the Covid_19 and other Corona viruses.   - Also it's recommended to take Zinc 50 mg to help  protect against the Covid_19  And best place to get  is also on [employer] and don't pay more than 6-8 cents /pill !   ================================= Coronavirus (COVID-19) Are you at risk?  Are you at risk for the Coronavirus (COVID-19)?  To be considered HIGH RISK for Coronavirus (COVID-19), you have to meet the following criteria:  . Traveled to [place], [place], [place], [place] or [place]; or in the [place] to [place], [place], [place]  . or [place]; and have fever, cough, and shortness of breath within the last 2 weeks of travel OR . Been in close contact with a person diagnosed with COVID-19 within the last 2 weeks and have  . fever, cough,and shortness of breath .  . IF YOU DO NOT MEET THESE CRITERIA, YOU ARE CONSIDERED LOW RISK FOR COVID-19.  What to do if you are HIGH RISK for COVID-19?  Marland Kitchen If you are having a medical emergency, call 911. . Seek medical care right away. Before you go to a doctor's office, urgent care or emergency department, .  call ahead and tell them about your recent travel, contact with someone diagnosed with COVID-19  .  and your symptoms.  . You should receive instructions from your physician's office regarding next steps of care.  . When you arrive at healthcare provider, tell the healthcare staff immediately you have returned from  . visiting [place], [place], [place], [place] or [place]; or traveled in the [place] to [place], [place],  . [place] or [place] in the last two weeks or you have been in close contact with a person diagnosed with  . COVID-19 in the last 2 weeks.   . Tell the health care staff about your symptoms: fever, cough and shortness of breath. . After you have been seen by a medical provider, you will be either: o Tested for (COVID-19) and  discharged home on quarantine except to seek medical care if  o symptoms worsen, and asked to  - Stay home and avoid contact with others until you get your results (4-5 days)  - Avoid travel on public transportation if possible (such as bus, train, or airplane) or o Sent to the Emergency Department by EMS for evaluation, COVID-19 testing  and  o possible admission depending on your condition and test results.  What to do if you are LOW RISK for COVID-19?  Reduce your risk of any infection by using the same precautions used for avoiding the common cold or flu:  Marland Kitchen Wash your hands often with soap and warm water for at least 20 seconds.  If soap and water are not readily available,  . use an alcohol-based hand sanitizer with at least 60% alcohol.  . If coughing or sneezing, cover your mouth and nose by coughing or sneezing into the elbow areas of your shirt or coat, .  into a tissue or into your sleeve (not your hands). . Avoid shaking hands with others and consider head nods or verbal greetings only. . Avoid touching your eyes, nose, or mouth with unwashed hands.  . Avoid close contact with people who are sick. . Avoid places or events with large numbers of people in one location, like concerts or sporting events. . Carefully consider travel  plans you have or are making. . If you are planning any travel outside or inside the [place], visit the CDC's Travelers' Health webpage for the latest health notices. . If you have some symptoms but not all symptoms, continue to monitor at home and seek medical attention  . if your symptoms worsen. . If you are having a medical emergency, call 911. >>>>>>>>>>>>>>>>>>>>>>>> Preventive Care for Adults  A healthy lifestyle and preventive care can promote health and wellness. Preventive health guidelines for men include the following key practices:  A routine yearly physical is a good way to check with your health care provider about your health and preventative  screening. It is a chance to share any concerns and updates on your health and to receive a thorough exam.  Visit your dentist for a routine exam and preventative care every 6 months. Brush your teeth twice a day and floss once a day. Good oral hygiene prevents tooth decay and gum disease.  The frequency of eye exams is based on your age, health, family medical history, use of contact lenses, and other factors. Follow your health care provider's recommendations for frequency of eye exams.  Eat a healthy diet. Foods such as vegetables, fruits, whole grains, low-fat dairy products, and lean protein foods contain the nutrients you need without too many calories. Decrease your intake of foods high in solid fats, added sugars, and salt. Eat the right amount of calories for you. Get information about a proper diet from your health care provider, if necessary.  Regular physical exercise is one of the most important things you can do for your health. Most adults should get at least 150 minutes of moderate-intensity exercise (any activity that increases your heart rate and causes you to sweat) each week. In addition, most adults need muscle-strengthening exercises on 2 or more days a week.  Maintain a healthy weight. The body mass index (BMI) is a screening tool to identify possible weight problems. It provides an estimate of body fat based on height and weight. Your health care provider can find your BMI and can help you achieve or maintain a healthy weight. For adults 20 years and older:  A BMI below 18.5 is considered underweight.  A BMI of 18.5 to 24.9 is normal.  A BMI of 25 to 29.9 is considered overweight.  A BMI of 30 and above is considered obese.  Maintain normal blood lipids and cholesterol levels by exercising and minimizing your intake of saturated fat. Eat a balanced diet with plenty of fruit and vegetables. Blood tests for lipids and cholesterol should begin at age 20 and be repeated every  5 years. If your lipid or cholesterol levels are high, you are over 50, or you are at high risk for heart disease, you may need your cholesterol levels checked more frequently. Ongoing high lipid and cholesterol levels should be treated with medicines if diet and exercise are not working.  If you smoke, find out from your health care provider how to quit. If you do not use tobacco, do not start.  Lung cancer screening is recommended for adults aged 55-80 years who are at high risk for developing lung cancer because of a history of smoking. A yearly low-dose CT scan of the lungs is recommended for people who have at least a 30-pack-year history of smoking and are a current smoker or have quit within the past 15 years. A pack year of smoking is smoking an average of 1 pack of cigarettes   a day for 1 year (for example: 1 pack a day for 30 years or 2 packs a day for 15 years). Yearly screening should continue until the smoker has stopped smoking for at least 15 years. Yearly screening should be stopped for people who develop a health problem that would prevent them from having lung cancer treatment.  If you choose to drink alcohol, do not have more than 2 drinks per day. One drink is considered to be 12 ounces (355 mL) of beer, 5 ounces (148 mL) of wine, or 1.5 ounces (44 mL) of liquor.  High blood pressure causes heart disease and increases the risk of stroke. Your blood pressure should be checked. Ongoing high blood pressure should be treated with medicines, if weight loss and exercise are not effective.  If you are 45-79 years old, ask your health care provider if you should take aspirin to prevent heart disease.  Diabetes screening involves taking a blood sample to check your fasting blood sugar level. Testing should be considered at a younger age or be carried out more frequently if you are overweight and have at least 1 risk factor for diabetes.  Colorectal cancer can be detected and often prevented.  Most routine colorectal cancer screening begins at the age of 50 and continues through age 75. However, your health care provider may recommend screening at an earlier age if you have risk factors for colon cancer. On a yearly basis, your health care provider may provide home test kits to check for hidden blood in the stool. Use of a small camera at the end of a tube to directly examine the colon (sigmoidoscopy or colonoscopy) can detect the earliest forms of colorectal cancer. Talk to your health care provider about this at age 50, when routine screening begins. Direct exam of the colon should be repeated every 5-10 years through age 75, unless early forms of precancerous polyps or small growths are found.  Screening for abdominal aortic aneurysm (AAA)  are recommended for persons over age 50 who have history of hypertensionor who are current or former smokers.  Talk with your health care provider about prostate cancer screening.  Testicular cancer screening is recommended for adult males. Screening includes self-exam, a health care provider exam, and other screening tests. Consult with your health care provider about any symptoms you have or any concerns you have about testicular cancer.  Use sunscreen. Apply sunscreen liberally and repeatedly throughout the day. You should seek shade when your shadow is shorter than you. Protect yourself by wearing long sleeves, pants, a wide-brimmed hat, and sunglasses year round, whenever you are outdoors.  Once a month, do a whole-body skin exam, using a mirror to look at the skin on your back. Tell your health care provider about new moles, moles that have irregular borders, moles that are larger than a pencil eraser, or moles that have changed in shape or color.  Stay current with required vaccines (immunizations).  Influenza vaccine. All adults should be immunized every year.  Tetanus, diphtheria, and acellular pertussis (Td, Tdap) vaccine. An adult who has  not previously received Tdap or who does not know his vaccine status should receive 1 dose of Tdap. This initial dose should be followed by tetanus and diphtheria toxoids (Td) booster doses every 10 years. Adults with an unknown or incomplete history of completing a 3-dose immunization series with Td-containing vaccines should begin or complete a primary immunization series including a Tdap dose. Adults should receive a Td booster every   10 years.  Zoster vaccine. One dose is recommended for adults aged 60 years or older unless certain conditions are present.    Pneumococcal 13-valent conjugate (PCV13) vaccine. When indicated, a person who is uncertain of his immunization history and has no record of immunization should receive the PCV13 vaccine. An adult aged 19 years or older who has certain medical conditions and has not been previously immunized should receive 1 dose of PCV13 vaccine. This PCV13 should be followed with a dose of pneumococcal polysaccharide (PPSV23) vaccine. The PPSV23 vaccine dose should be obtained at least 8 weeks after the dose of PCV13 vaccine. An adult aged 19 years or older who has certain medical conditions and previously received 1 or more doses of PPSV23 vaccine should receive 1 dose of PCV13. The PCV13 vaccine dose should be obtained 1 or more years after the last PPSV23 vaccine dose.    Pneumococcal polysaccharide (PPSV23) vaccine. When PCV13 is also indicated, PCV13 should be obtained first. All adults aged 65 years and older should be immunized. An adult younger than age 65 years who has certain medical conditions should be immunized. Any person who resides in a nursing home or long-term care facility should be immunized. An adult smoker should be immunized. People with an immunocompromised condition and certain other conditions should receive both PCV13 and PPSV23 vaccines. People with human immunodeficiency virus (HIV) infection should be immunized as soon as possible  after diagnosis. Immunization during chemotherapy or radiation therapy should be avoided. Routine use of PPSV23 vaccine is not recommended for American Indians, [place] Natives, or people younger than 65 years unless there are medical conditions that require PPSV23 vaccine. When indicated, people who have unknown immunization and have no record of immunization should receive PPSV23 vaccine. One-time revaccination 5 years after the first dose of PPSV23 is recommended for people aged 19-64 years who have chronic kidney failure, nephrotic syndrome, asplenia, or immunocompromised conditions. People who received 1-2 doses of PPSV23 before age 65 years should receive another dose of PPSV23 vaccine at age 65 years or later if at least 5 years have passed since the previous dose. Doses of PPSV23 are not needed for people immunized with PPSV23 at or after age 65 years.  Hepatitis A vaccine. Adults who wish to be protected from this disease, have certain high-risk conditions, work with hepatitis A-infected animals, work in hepatitis A research labs, or travel to or work in countries with a high rate of hepatitis A should be immunized. Adults who were previously unvaccinated and who anticipate close contact with an international adoptee during the first 60 days after arrival in the [place] from a country with a high rate of hepatitis A should be immunized.  Hepatitis B vaccine. Adults should be immunized if they wish to be protected from this disease, have certain high-risk conditions, may be exposed to blood or other infectious body fluids, are household contacts or sex partners of hepatitis B positive people, are clients or workers in certain care facilities, or travel to or work in countries with a high rate of hepatitis B.  Preventive Service / Frequency  Ages 19 to 39  Blood pressure check.  Lipid and cholesterol check.  Hepatitis C blood test.** / For any individual with known risks for hepatitis  C.  Skin self-exam. / Monthly.  Influenza vaccine. / Every year.  Tetanus, diphtheria, and acellular pertussis (Tdap, Td) vaccine.** / Consult your health care provider. 1 dose of Td every 10 years.  HPV vaccine. /   3 doses over 6 months, if 26 or younger.  Measles, mumps, rubella (MMR) vaccine.** / You need at least 1 dose of MMR if you were born in 1957 or later. You may also need a second dose.  Pneumococcal 13-valent conjugate (PCV13) vaccine.** / Consult your health care provider.  Pneumococcal polysaccharide (PPSV23) vaccine.** / 1 to 2 doses if you smoke cigarettes or if you have certain conditions.  Meningococcal vaccine.** / 1 dose if you are age 19 to 21 years and a first-year college student living in a residence hall, or have one of several medical conditions. You may also need additional booster doses.  Hepatitis A vaccine.** / Consult your health care provider.  Hepatitis B vaccine.** / Consult your health care provider. +++++++++ Recommend Adult Low Dose Aspirin or  coated  Aspirin 81 mg daily  To reduce risk of Colon Cancer 40 %,  Skin Cancer 26 % ,  Melanoma 46%  and  Pancreatic cancer 60% ++++++++++++++++++ Vitamin D goal  is between 70-100.  Please make sure that you are taking your Vitamin D as directed.  It is very important as a natural anti-inflammatory  helping hair, skin, and nails, as well as reducing stroke and heart attack risk.  It helps your bones and helps with mood. It also decreases numerous cancer risks so please take it as directed.  Low Vit D is associated with a 200-300% higher risk for CANCER  and 200-300% higher risk for HEART   ATTACK  &  STROKE.   ...................................... It is also associated with higher death rate at younger ages,  autoimmune diseases like Rheumatoid arthritis, Lupus, Multiple Sclerosis.    Also many other serious conditions, like depression, Alzheimer's Dementia, infertility, muscle aches, fatigue,  fibromyalgia - just to name a few. +++++++++++++++++++++ Recommend the book "The END of DIETING" by Dr Joel Fuhrman  & the book "The END of DIABETES " by Dr Joel Fuhrman At [employer] - get book & Audio CD's    Being diabetic has a  300% increased risk for heart attack, stroke, cancer, and alzheimer- type vascular dementia. It is very important that you work harder with diet by avoiding all foods that are white. Avoid white rice (brown & wild rice is OK), white potatoes (sweetpotatoes in moderation is OK), White bread or wheat bread or anything made out of white flour like bagels, donuts, rolls, buns, biscuits, cakes, pastries, cookies, pizza crust, and pasta (made from white flour & egg whites) - vegetarian pasta or spinach or wheat pasta is OK. Multigrain breads like Arnold's or [employer], or multigrain sandwich thins or flatbreads.  Diet, exercise and weight loss can reverse and cure diabetes in the early stages.  Diet, exercise and weight loss is very important in the control and prevention of complications of diabetes which affects every system in your body, ie. Brain - dementia/stroke, eyes - glaucoma/blindness, heart - heart attack/heart failure, kidneys - dialysis, stomach - gastric paralysis, intestines - malabsorption, nerves - severe painful neuritis, circulation - gangrene & loss of a leg(s), and finally cancer and Alzheimers.    I recommend avoid fried & greasy foods,  sweets/candy, white rice (brown or wild rice or Quinoa is OK), white potatoes (sweet potatoes are OK) - anything made from white flour - bagels, doughnuts, rolls, buns, biscuits,white and wheat breads, pizza crust and traditional pasta made of white flour & egg white(vegetarian pasta or spinach or wheat pasta is OK).  Multi-grain bread is OK - like multi-grain   flat bread or sandwich thins. Avoid alcohol in excess. Exercise is also important.    Eat all the vegetables you want - avoid meat, especially red meat and dairy -  especially cheese.  Cheese is the most concentrated form of trans-fats which is the worst thing to clog up our arteries. Veggie cheese is OK which can be found in the fresh produce section at [employer] or [employer] or [employer]  +++++++++++++++++++ DASH Eating Plan  DASH stands for "Dietary Approaches to Stop Hypertension."   The DASH eating plan is a healthy eating plan that has been shown to reduce high blood pressure (hypertension). Additional health benefits may include reducing the risk of type 2 diabetes mellitus, heart disease, and stroke. The DASH eating plan may also help with weight loss. WHAT DO I NEED TO KNOW ABOUT THE DASH EATING PLAN? For the DASH eating plan, you will follow these general guidelines:  Choose foods with a percent daily value for sodium of less than 5% (as listed on the food label).  Use salt-free seasonings or herbs instead of table salt or sea salt.  Check with your health care provider or pharmacist before using salt substitutes.  Eat lower-sodium products, often labeled as "lower sodium" or "no salt added."  Eat fresh foods.  Eat more vegetables, fruits, and low-fat dairy products.  Choose whole grains. Look for the word "whole" as the first word in the ingredient list.  Choose fish   Limit sweets, desserts, sugars, and sugary drinks.  Choose heart-healthy fats.  Eat veggie cheese   Eat more home-cooked food and less restaurant, buffet, and fast food.  Limit fried foods.  Cook foods using methods other than frying.  Limit canned vegetables. If you do use them, rinse them well to decrease the sodium.  When eating at a restaurant, ask that your food be prepared with less salt, or no salt if possible.                      WHAT FOODS CAN I EAT? Read Dr Fara Olden Fuhrman's books on The End of Dieting & The End of Diabetes  Grains Whole grain or whole wheat bread. Brown rice. Whole grain or whole wheat pasta. Quinoa, bulgur, and whole grain  cereals. Low-sodium cereals. Corn or whole wheat flour tortillas. Whole grain cornbread. Whole grain crackers. Low-sodium crackers.  Vegetables Fresh or frozen vegetables (raw, steamed, roasted, or grilled). Low-sodium or reduced-sodium tomato and vegetable juices. Low-sodium or reduced-sodium tomato sauce and paste. Low-sodium or reduced-sodium canned vegetables.   Fruits All fresh, canned (in natural juice), or frozen fruits.  Protein Products  All fish and seafood.  Dried beans, peas, or lentils. Unsalted nuts and seeds. Unsalted canned beans.  Dairy Low-fat dairy products, such as skim or 1% milk, 2% or reduced-fat cheeses, low-fat ricotta or cottage cheese, or plain low-fat yogurt. Low-sodium or reduced-sodium cheeses.  Fats and Oils Tub margarines without trans fats. Light or reduced-fat mayonnaise and salad dressings (reduced sodium). Avocado. Safflower, olive, or canola oils. Natural peanut or almond butter.  Other Unsalted popcorn and pretzels. The items listed above may not be a complete list of recommended foods or beverages. Contact your dietitian for more options.  +++++++++++++++++++  WHAT FOODS ARE NOT RECOMMENDED? Grains/ White flour or wheat flour White bread. White pasta. White rice. Refined cornbread. Bagels and croissants. Crackers that contain trans fat.  Vegetables  Creamed or fried vegetables. Vegetables in a . Regular canned vegetables. Regular canned  tomato sauce and paste. Regular tomato and vegetable juices.  Fruits Dried fruits. Canned fruit in light or heavy syrup. Fruit juice.  Meat and Other Protein Products Meat in general - RED meat & White meat.  Fatty cuts of meat. Ribs, chicken wings, all processed meats as bacon, sausage, bologna, salami, fatback, hot dogs, bratwurst and packaged luncheon meats.  Dairy Whole or 2% milk, cream, half-and-half, and cream cheese. Whole-fat or sweetened yogurt. Full-fat cheeses or blue cheese. Non-dairy creamers  and whipped toppings. Processed cheese, cheese spreads, or cheese curds.  Condiments Onion and garlic salt, seasoned salt, table salt, and sea salt. Canned and packaged gravies. Worcestershire sauce. Tartar sauce. Barbecue sauce. Teriyaki sauce. Soy sauce, including reduced sodium. Steak sauce. Fish sauce. Oyster sauce. Cocktail sauce. Horseradish. Ketchup and mustard. Meat flavorings and tenderizers. Bouillon cubes. Hot sauce. Tabasco sauce. Marinades. Taco seasonings. Relishes.  Fats and Oils Butter, stick margarine, lard, shortening and bacon fat. Coconut, palm kernel, or palm oils. Regular salad dressings.  Pickles and olives. Salted popcorn and pretzels.  The items listed above may not be a complete list of foods and beverages to avoid.    

## 2019-04-15 ENCOUNTER — Encounter: Payer: Self-pay | Admitting: Internal Medicine

## 2019-04-15 ENCOUNTER — Ambulatory Visit (INDEPENDENT_AMBULATORY_CARE_PROVIDER_SITE_OTHER): Payer: Self-pay | Admitting: Internal Medicine

## 2019-04-15 ENCOUNTER — Other Ambulatory Visit: Payer: Self-pay

## 2019-04-15 VITALS — BP 124/76 | HR 76 | Temp 97.0°F | Resp 18 | Ht 75.0 in | Wt 249.0 lb

## 2019-04-15 DIAGNOSIS — Z79899 Other long term (current) drug therapy: Secondary | ICD-10-CM

## 2019-04-15 DIAGNOSIS — E039 Hypothyroidism, unspecified: Secondary | ICD-10-CM

## 2019-04-15 DIAGNOSIS — I10 Essential (primary) hypertension: Secondary | ICD-10-CM

## 2019-04-15 DIAGNOSIS — K55069 Acute infarction of intestine, part and extent unspecified: Secondary | ICD-10-CM

## 2019-04-15 DIAGNOSIS — E782 Mixed hyperlipidemia: Secondary | ICD-10-CM

## 2019-04-15 DIAGNOSIS — D6862 Lupus anticoagulant syndrome: Secondary | ICD-10-CM

## 2019-04-15 DIAGNOSIS — K8301 Primary sclerosing cholangitis: Secondary | ICD-10-CM

## 2019-04-15 DIAGNOSIS — I81 Portal vein thrombosis: Secondary | ICD-10-CM

## 2019-04-15 LAB — CBC WITH DIFFERENTIAL/PLATELET
Absolute Monocytes: 293 cells/uL (ref 200–950)
Basophils Absolute: 9 cells/uL (ref 0–200)
Basophils Relative: 0.2 %
Eosinophils Absolute: 32 cells/uL (ref 15–500)
Eosinophils Relative: 0.7 %
HCT: 40.7 % (ref 38.5–50.0)
Hemoglobin: 14 g/dL (ref 13.2–17.1)
Lymphs Abs: 1184 cells/uL (ref 850–3900)
MCH: 31.3 pg (ref 27.0–33.0)
MCHC: 34.4 g/dL (ref 32.0–36.0)
MCV: 91.1 fL (ref 80.0–100.0)
MPV: 11 fL (ref 7.5–12.5)
Monocytes Relative: 6.5 %
Neutro Abs: 2984 cells/uL (ref 1500–7800)
Neutrophils Relative %: 66.3 %
Platelets: 133 10*3/uL — ABNORMAL LOW (ref 140–400)
RBC: 4.47 10*6/uL (ref 4.20–5.80)
RDW: 13.5 % (ref 11.0–15.0)
Total Lymphocyte: 26.3 %
WBC: 4.5 10*3/uL (ref 3.8–10.8)

## 2019-04-15 LAB — TSH: TSH: 3.87 mIU/L (ref 0.40–4.50)

## 2019-04-16 ENCOUNTER — Encounter: Payer: Self-pay | Admitting: Internal Medicine

## 2019-04-29 ENCOUNTER — Telehealth: Payer: Self-pay

## 2019-04-29 ENCOUNTER — Encounter: Payer: Self-pay | Admitting: Adult Health

## 2019-04-29 ENCOUNTER — Other Ambulatory Visit: Payer: Self-pay

## 2019-04-29 ENCOUNTER — Ambulatory Visit (INDEPENDENT_AMBULATORY_CARE_PROVIDER_SITE_OTHER): Payer: Self-pay | Admitting: Adult Health

## 2019-04-29 VITALS — BP 134/82 | HR 70 | Temp 97.0°F | Ht 75.0 in | Wt 249.0 lb

## 2019-04-29 DIAGNOSIS — R600 Localized edema: Secondary | ICD-10-CM

## 2019-04-29 DIAGNOSIS — Z86718 Personal history of other venous thrombosis and embolism: Secondary | ICD-10-CM

## 2019-04-29 DIAGNOSIS — M545 Low back pain, unspecified: Secondary | ICD-10-CM

## 2019-04-29 NOTE — Progress Notes (Signed)
Assessment and Plan:  Kyle Gonzales was seen today for leg swelling.  Diagnoses and all orders for this visit:  Edema of right lower extremity/Personal history of DVT (deep vein thrombosis) Resolved by time of exam; hx of DVT and S protein def but continues on elequis 5 mg BID Exam is normal, calves symmetrical, no edema, no point tenderness, Homan's negative After discussion with patient defer any workup at this time; he will contact back if any recurrent sx Continue elequis; follow up Dr. Irene Limbo as recommended  Lumbar back pain - negative straight leg NSAIDs, RICE, and exercise given, reviewed body mechanics, advised supportive shoes when on feet at work and regular breaks and stretches If not better follow up in office and get XR, consider muscle relaxers or will refer to PT/orthopedics.  Further disposition pending results of labs. Discussed med's effects and SE's.   Over 30 minutes of exam, counseling, chart review, and critical decision making was performed.   Future Appointments  Date Time Provider Alpha  08/01/2019 10:30 AM [name], MD [facility] None  05/05/2020  3:00 PM [name], MD [facility] None    ------------------------------------------------------------------------------------------------------------------  HPI BP 134/82   Pulse 70   Temp (!) 97 F (36.1 C)   Ht 6\' 3"  (1.905 m)   Wt 249 lb (112.9 kg)   SpO2 98%   BMI 31.12 kg/m   27 y.o.male with hx of htn, DVT, PSC, marijuana smoker presents for evaluation of RLE edema; he reports he noted this AM that R shin appeared somewhat swollen, appeared "shiny" - seems to have since resolved and presents with bilateral LE at baseline. Denies pain in calf, warmth, erythema, recent wounds, falls/trauma, air travel or long car rides.   He has notable hx of superior mesenteric vein thrombosis in 08/2018 and also diagnosed with primary sclerosing cholangitis (now followed by Dr. Havery Moros, cytology neg  for malignancy, underwent balloon dilation, followed by hepatology Dr. Zollie Scale q46m);  Was evaluated by hem/onc Dr. 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 -local hepatobiliary inflammation from [facility] is an addition risk factor. -would be recommend long term Eliquis given ongoing risk factors as noted above.  The patient has continued on elequis 5 mg BID since that time, receiving for free after qualifying for assistance program via hem/onc.   He admits he has not been checking BPs at home, today their BP is initially 160/90, BP: 134/82 at manual recheck later in appointment.    He does not workout. He denies chest pain, shortness of breath, dizziness.  He also reports chronic intermittent ongoing lower back pain since high school; was on wrestling team and reports ongoing since that time; reports typically has midline, lower back aching, worst after long shift at work; 4-5/10, nonradiating, aching. Denies numbness/tinglign, weakness. He admits to wearing slides at work. Does demonstrate good understanding of body mechanics. Hasn't tried anything for pain.    Past Medical History:  Diagnosis Date  . GERD (gastroesophageal reflux disease)   . Graves disease 08/18/2015  . Hyperlipidemia   . Hypothyroid   . Palpitations   . PSC (primary sclerosing  cholangitis)   . Thrombosis    superior mesenteric vein - 2019  . Vitamin D deficiency      Allergies  Allergen Reactions  . Tylenol [Acetaminophen] Other (See Comments)    r/t elevated LFTs     Current Outpatient Medications on File  Prior to Visit  Medication Sig  . alum & mag hydroxide-simeth (MAALOX ADVANCED MAX ST) 400-400-40 MG/5ML suspension Take 15 mLs by mouth every 6 (six) hours as needed for indigestion.  Marland Kitchen apixaban (ELIQUIS) 5 MG TABS tablet TAKE 1 TABLET(5 MG) BY MOUTH TWICE DAILY  . bisoprolol-hydrochlorothiazide (ZIAC) 10-6.25 MG tablet Take 1 tablet daily for BP  . busPIRone (BUSPAR) 10 MG tablet Take 1 tablet 3 x /day for anxiety  . Cholecalciferol (VITAMIN D3) 5000 units CAPS Take 1 capsule (5,000 Units total) by mouth every other day.  . escitalopram (LEXAPRO) 20 MG tablet TAKE 1 TABLET BY MOUTH EVERY DAY FOR MOOD, ANXIETY, IRRITABILITY AND DEPRESSION  . ezetimibe (ZETIA) 10 MG tablet TAKE 1 TABLET BY MOUTH ONCE DAILY FOR CHOLESTEROL  . levothyroxine (SYNTHROID) 100 MCG tablet Take 1 tablet 1st in the morning on an empty stomach with ONLY water for 30 minutes and no Antacids for 4 hours  . omeprazole (PRILOSEC) 40 MG capsule TAKE 1 CAPSULE BY MOUTH EVERY MORNING BEFORE BREAKFAST  . ondansetron (ZOFRAN) 8 MG tablet Take 1 tablet 3 x /day before meals for Nausea   No current facility-administered medications on file prior to visit.     ROS: all negative except above.   Physical Exam:  BP 134/82   Pulse 70   Temp (!) 97 F (36.1 C)   Ht 6\' 3"  (1.905 m)   Wt 249 lb (112.9 kg)   SpO2 98%   BMI 31.12 kg/m   General Appearance: Well nourished, obese, young AA male in no apparent distress. Eyes: conjunctiva no swelling or erythema ENT/Mouth: Hearing normal.  Neck: Supple Respiratory: Respiratory effort normal, BS equal bilaterally without rales, rhonchi, wheezing or stridor.  Cardio: RRR with no MRGs. Brisk peripheral pulses without edema. Bilateral calves symmetrical, 45 cm respectively, non-tender, non-erythematous, no point tenderness through calfs, neg Homan's Abdomen: Soft, + BS.  Non tender, non-distended Lymphatics: Non tender without lymphadenopathy.  Musculoskeletal: Full ROM in all  spheres lumbar back, 5/5 strength, normal gait, neg straight leg raise, no spinous tenderness, SI joint tenderness Skin: Warm, dry without rashes, lesions, ecchymosis.  Neuro: bilateral patellar and achilles reflexes 2+ and symmetrical, Sensation intact.  Psych: Awake and oriented X 3, normal affect, Insight and Judgment appropriate.     Izora Ribas, NP 5:01 PM [facility]

## 2019-04-29 NOTE — Telephone Encounter (Signed)
LM for pt to have labs done. Letter reminder sent also.

## 2019-04-29 NOTE — Patient Instructions (Addendum)
Recommend ibuprofen or aleve as needed for pain; if pain at work doesn't improve with regular back exercises, posture, improved shoes/support, trying taking antiinflammatory prior to your shift  If not improving with this in 8 weeks, can call back and I can send in a muscle relaxer to try as needed.   Chronic Back Pain When back pain lasts longer than 3 months, it is called chronic back pain.The cause of your back pain may not be known. Some common causes include:  Wear and tear (degenerative disease) of the bones, ligaments, or disks in your back.  Inflammation and stiffness in your back (arthritis). People who have chronic back pain often go through certain periods in which the pain is more intense (flare-ups). Many people can learn to manage the pain with home care. Follow these instructions at home: Pay attention to any changes in your symptoms. Take these actions to help with your pain: Activity   Avoid bending and other activities that make the problem worse.  Maintain a proper position when standing or sitting: ? When standing, keep your upper back and neck straight, with your shoulders pulled back. Avoid slouching. ? When sitting, keep your back straight and relax your shoulders. Do not round your shoulders or pull them backward.  Do not sit or stand in one place for long periods of time.  Take brief periods of rest throughout the day. This will reduce your pain. Resting in a lying or standing position is usually better than sitting to rest.  When you are resting for longer periods, mix in some mild activity or stretching between periods of rest. This will help to prevent stiffness and pain.  Get regular exercise. Ask your health care provider what activities are safe for you.  Do not lift anything that is heavier than 10 lb (4.5 kg). Always use proper lifting technique, which includes: ? Bending your knees. ? Keeping the load close to your body. ? Avoiding  twisting.  Sleep on a firm mattress in a comfortable position. Try lying on your side with your knees slightly bent. If you lie on your back, put a pillow under your knees. Managing pain  If directed, apply ice to the painful area. Your health care provider may recommend applying ice during the first 24-48 hours after a flare-up begins. ? Put ice in a plastic bag. ? Place a towel between your skin and the bag. ? Leave the ice on for 20 minutes, 2-3 times per day.  If directed, apply heat to the affected area as often as told by your health care provider. Use the heat source that your health care provider recommends, such as a moist heat pack or a heating pad. ? Place a towel between your skin and the heat source. ? Leave the heat on for 20-30 minutes. ? Remove the heat if your skin turns bright red. This is especially important if you are unable to feel pain, heat, or cold. You may have a greater risk of getting burned.  Try soaking in a warm tub.  Take over-the-counter and prescription medicines only as told by your health care provider.  Keep all follow-up visits as told by your health care provider. This is important. Contact a health care provider if:  You have pain that is not relieved with rest or medicine. Get help right away if:  You have weakness or numbness in one or both of your legs or feet.  You have trouble controlling your bladder or your bowels.  You have nausea or vomiting.  You have pain in your abdomen.  You have shortness of breath or you faint. This information is not intended to replace advice given to you by your health care provider. Make sure you discuss any questions you have with your health care provider. Document Released: 11/09/2004 Document Revised: 01/23/2019 Document Reviewed: 04/11/2017 Elsevier Patient Education  2020 [place].      Back Exercises The following exercises strengthen the muscles that help to support the trunk and back.  They also help to keep the lower back flexible. Doing these exercises can help to prevent back pain or lessen existing pain.  If you have back pain or discomfort, try doing these exercises 2-3 times each day or as told by your health care provider.  As your pain improves, do them once each day, but increase the number of times that you repeat the steps for each exercise (do more repetitions).  To prevent the recurrence of back pain, continue to do these exercises once each day or as told by your health care provider. Do exercises exactly as told by your health care provider and adjust them as directed. It is normal to feel mild stretching, pulling, tightness, or discomfort as you do these exercises, but you should stop right away if you feel sudden pain or your pain gets worse. Exercises Single knee to chest Repeat these steps 3-5 times for each leg: 1. Lie on your back on a firm bed or the floor with your legs extended. 2. Bring one knee to your chest. Your other leg should stay extended and in contact with the floor. 3. Hold your knee in place by grabbing your knee or thigh with both hands and hold. 4. Pull on your knee until you feel a gentle stretch in your lower back or buttocks. 5. Hold the stretch for 10-30 seconds. 6. Slowly release and straighten your leg. Pelvic tilt Repeat these steps 5-10 times: 1. Lie on your back on a firm bed or the floor with your legs extended. 2. Bend your knees so they are pointing toward the ceiling and your feet are flat on the floor. 3. Tighten your lower abdominal muscles to press your lower back against the floor. This motion will tilt your pelvis so your tailbone points up toward the ceiling instead of pointing to your feet or the floor. 4. With gentle tension and even breathing, hold this position for 5-10 seconds. Cat-cow Repeat these steps until your lower back becomes more flexible: 1. Get into a hands-and-knees position on a firm surface. Keep  your hands under your shoulders, and keep your knees under your hips. You may place padding under your knees for comfort. 2. Let your head hang down toward your chest. Contract your abdominal muscles and point your tailbone toward the floor so your lower back becomes rounded like the back of a cat. 3. Hold this position for 5 seconds. 4. Slowly lift your head, let your abdominal muscles relax and point your tailbone up toward the ceiling so your back forms a sagging arch like the back of a cow. 5. Hold this position for 5 seconds.  Press-ups Repeat these steps 5-10 times: 1. Lie on your abdomen (face-down) on the floor. 2. Place your palms near your head, about shoulder-width apart. 3. Keeping your back as relaxed as possible and keeping your hips on the floor, slowly straighten your arms to raise the top half of your body and lift your shoulders. Do not  use your back muscles to raise your upper torso. You may adjust the placement of your hands to make yourself more comfortable. 4. Hold this position for 5 seconds while you keep your back relaxed. 5. Slowly return to lying flat on the floor.  Bridges Repeat these steps 10 times: 1. Lie on your back on a firm surface. 2. Bend your knees so they are pointing toward the ceiling and your feet are flat on the floor. Your arms should be flat at your sides, next to your body. 3. Tighten your buttocks muscles and lift your buttocks off the floor until your waist is at almost the same height as your knees. You should feel the muscles working in your buttocks and the back of your thighs. If you do not feel these muscles, slide your feet 1-2 inches farther away from your buttocks. 4. Hold this position for 3-5 seconds. 5. Slowly lower your hips to the starting position, and allow your buttocks muscles to relax completely. If this exercise is too easy, try doing it with your arms crossed over your chest. Abdominal crunches Repeat these steps 5-10  times: 1. Lie on your back on a firm bed or the floor with your legs extended. 2. Bend your knees so they are pointing toward the ceiling and your feet are flat on the floor. 3. Cross your arms over your chest. 4. Tip your chin slightly toward your chest without bending your neck. 5. Tighten your abdominal muscles and slowly raise your trunk (torso) high enough to lift your shoulder blades a tiny bit off the floor. Avoid raising your torso higher than that because it can put too much stress on your low back and does not help to strengthen your abdominal muscles. 6. Slowly return to your starting position. Back lifts Repeat these steps 5-10 times: 1. Lie on your abdomen (face-down) with your arms at your sides, and rest your forehead on the floor. 2. Tighten the muscles in your legs and your buttocks. 3. Slowly lift your chest off the floor while you keep your hips pressed to the floor. Keep the back of your head in line with the curve in your back. Your eyes should be looking at the floor. 4. Hold this position for 3-5 seconds. 5. Slowly return to your starting position. Contact a health care provider if:  Your back pain or discomfort gets much worse when you do an exercise.  Your worsening back pain or discomfort does not lessen within 2 hours after you exercise. If you have any of these problems, stop doing these exercises right away. Do not do them again unless your health care provider says that you can. Get help right away if:  You develop sudden, severe back pain. If this happens, stop doing the exercises right away. Do not do them again unless your health care provider says that you can. This information is not intended to replace advice given to you by your health care provider. Make sure you discuss any questions you have with your health care provider. Document Released: 11/09/2004 Document Revised: 02/06/2019 Document Reviewed: 07/04/2018 Elsevier Patient Education  2020 [employer].

## 2019-04-29 NOTE — Telephone Encounter (Signed)
-----   Message from Roetta Sessions, [place] sent at 11/04/2018  4:39 PM EST ----- Regarding: LFTs in July Pt due for LFTs in July 2020 [facility])

## 2019-05-02 ENCOUNTER — Other Ambulatory Visit (INDEPENDENT_AMBULATORY_CARE_PROVIDER_SITE_OTHER): Payer: Self-pay

## 2019-05-02 ENCOUNTER — Other Ambulatory Visit: Payer: Self-pay

## 2019-05-02 DIAGNOSIS — K8301 Primary sclerosing cholangitis: Secondary | ICD-10-CM

## 2019-05-02 LAB — HEPATIC FUNCTION PANEL
ALT: 54 U/L — ABNORMAL HIGH (ref 0–53)
AST: 27 U/L (ref 0–37)
Albumin: 4.9 g/dL (ref 3.5–5.2)
Alkaline Phosphatase: 83 U/L (ref 39–117)
Bilirubin, Direct: 0.1 mg/dL (ref 0.0–0.3)
Total Bilirubin: 0.5 mg/dL (ref 0.2–1.2)
Total Protein: 7.9 g/dL (ref 6.0–8.3)

## 2019-05-02 NOTE — Progress Notes (Signed)
Orders entered

## 2019-05-05 ENCOUNTER — Other Ambulatory Visit: Payer: Self-pay

## 2019-05-05 DIAGNOSIS — K219 Gastro-esophageal reflux disease without esophagitis: Secondary | ICD-10-CM

## 2019-05-05 MED ORDER — OMEPRAZOLE 40 MG PO CPDR: DELAYED_RELEASE_CAPSULE | ORAL | 3 refills | Status: DC

## 2019-07-01 ENCOUNTER — Other Ambulatory Visit: Payer: Self-pay | Admitting: Physician Assistant

## 2019-07-01 DIAGNOSIS — R112 Nausea with vomiting, unspecified: Secondary | ICD-10-CM

## 2019-07-18 ENCOUNTER — Other Ambulatory Visit: Payer: Self-pay | Admitting: Internal Medicine

## 2019-07-18 DIAGNOSIS — E039 Hypothyroidism, unspecified: Secondary | ICD-10-CM

## 2019-07-18 MED ORDER — LEVOTHYROXINE SODIUM 100 MCG PO TABS: ORAL_TABLET | ORAL | 0 refills | Status: DC

## 2019-07-19 ENCOUNTER — Other Ambulatory Visit: Payer: Self-pay | Admitting: Physician Assistant

## 2019-07-19 DIAGNOSIS — F332 Major depressive disorder, recurrent severe without psychotic features: Secondary | ICD-10-CM

## 2019-08-01 ENCOUNTER — Ambulatory Visit: Payer: Self-pay | Admitting: Internal Medicine

## 2019-08-04 ENCOUNTER — Encounter: Payer: Self-pay | Admitting: Internal Medicine

## 2019-08-04 DIAGNOSIS — R7309 Other abnormal glucose: Secondary | ICD-10-CM | POA: Insufficient documentation

## 2019-08-04 DIAGNOSIS — D6859 Other primary thrombophilia: Secondary | ICD-10-CM | POA: Insufficient documentation

## 2019-08-04 NOTE — Progress Notes (Signed)
History of Present Illness:      This very nice 28 y.o. single BM presents for 3 month follow up with HTN, HLD, Hypothyroidism, Pre-Diabetes, Depression and Vitamin D Deficiency. He also has hx/o [place].      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 In Nov 2019, he had ERCP/Sphincterotomy by Dr Ardis Hughs and patient is followed also by Dr Havery Moros.       Patient is treated for HTN (2016)  & BP has been controlled at home. Today's BP is at goal - 120/66. Patient has had no complaints of any cardiac type chest pain, palpitations, dyspnea / orthopnea / PND, dizziness, claudication, or dependent edema.  Hyperlipidemia is not controlled with diet & Zetia  (Statins avoided due to liver disease). Last Lipids were not at goal:  Lab Results  Component Value Date   CHOL 178 06/05/2018   HDL 49 06/05/2018   LDLCALC 110 (H) 06/05/2018   TRIG 93 06/05/2018   CHOLHDL 3.6 06/05/2018       Also, the patient has Moderate Obesity (BMI 32+) and is followed expectantly for glucose intolerance.   Patient denies symptoms of reactive hypoglycemia, diabetic polys, paresthesias or visual blurring.  Last A1c was Normal & at goal:  Lab Results  Component Value Date   HGBA1C 5.5 02/04/2018      Patient was dx'd / treated with RAI-131* for Graves disease in Nov 2016 and in Feb 2017, he was started on thyroid replacement.       Further, the patient also has history of Vitamin D Deficiency ("7" / 2016)  and supplements vitamin D without any suspected side-effects. Last vitamin D was at goal:  Lab Results  Component Value Date   VD25OH 67 02/04/2018    Current Outpatient Medications on File Prior to Visit  Medication Sig  . alum & mag hydroxide-simeth (MAALOX ADVANCED MAX ST) 400-400-40 MG/5ML suspension Take 15 mLs by mouth every 6  (six) hours as needed for indigestion.  Marland Kitchen apixaban (ELIQUIS) 5 MG TABS tablet TAKE 1 TABLET(5 MG) BY MOUTH TWICE DAILY  . bisoprolol-hydrochlorothiazide (ZIAC) 10-6.25 MG tablet Take 1 tablet daily for BP  . busPIRone (BUSPAR) 10 MG tablet Take 1 tablet 3 x /day for anxiety  . Cholecalciferol (VITAMIN D3) 5000 units CAPS Take 1 capsule (5,000 Units total) by mouth every other day.  . escitalopram (LEXAPRO) 20 MG tablet Take 1 tablet Daily for Mood, Anxiety, Irritability  & Depression  . ezetimibe (ZETIA) 10 MG tablet TAKE ONE TABLET BY MOUTH DAILY FOR CHOLESTEROL  . levothyroxine (SYNTHROID) 100 MCG tablet Take 1 tablet daily on an empty stomach with only water for 30 minutes & no Antacid meds, Calcium or Magnesium for 4 hours & avoid Biotin  . omeprazole (PRILOSEC) 40 MG capsule TAKE 1 CAPSULE BY MOUTH EVERY MORNING BEFORE BREAKFAST  . ondansetron (ZOFRAN) 8 MG tablet TAKE ONE TABLET BY MOUTH THREE TIMES A DAY BEFORE MEALS FOR NAUSEA   No current facility-administered medications on file prior to visit.     Allergies  Allergen Reactions  . Tylenol [Acetaminophen] Other (See Comments)    r/t elevated LFTs    PMHx:   Past Medical History:  Diagnosis Date  . GERD (gastroesophageal reflux disease)   . Graves disease 08/18/2015  . Hyperlipidemia   . Hypothyroid   .  Palpitations   . PSC (primary sclerosing cholangitis)   . Thrombosis    superior mesenteric vein - 2019  . Vitamin D deficiency    Immunization History  Administered Date(s) Administered  . PPD Test 12/06/2015  . Td 10/16/2008   Past Surgical History:  Procedure Laterality Date  . ANTERIOR CRUCIATE LIGAMENT REPAIR  2012  . ERCP N/A 08/19/2018   Procedure: ENDOSCOPIC RETROGRADE CHOLANGIOPANCREATOGRAPHY (ERCP);  Surgeon: Milus Banister, MD;  Location: [facility];  Service: Endoscopy;  Laterality: N/A;  . REMOVAL OF STONES  08/19/2018   Procedure: REMOVAL OF STONES;  Surgeon: Milus Banister, MD;  Location: [facility]  [facility];  Service: Endoscopy;;  . Joan Mayans  08/19/2018   Procedure: Joan Mayans;  Surgeon: Milus Banister, MD;  Location: [facility];  Service: Endoscopy;;  . WISDOM TOOTH EXTRACTION     FHx:    Reviewed / unchanged  SHx:    Reviewed / unchanged   Systems Review:  Constitutional: Denies fever, chills, wt changes, headaches, insomnia, fatigue, night sweats, change in appetite. Eyes: Denies redness, blurred vision, diplopia, discharge, itchy, watery eyes.  ENT: Denies discharge, congestion, post nasal drip, epistaxis, sore throat, earache, hearing loss, dental pain, tinnitus, vertigo, sinus pain, snoring.  CV: Denies chest pain, palpitations, irregular heartbeat, syncope, dyspnea, diaphoresis, orthopnea, PND, claudication or edema. Respiratory: denies cough, dyspnea, DOE, pleurisy, hoarseness, laryngitis, wheezing.  Gastrointestinal: Denies dysphagia, odynophagia, heartburn, reflux, water brash, abdominal pain or cramps, nausea, vomiting, bloating, diarrhea, constipation, hematemesis, melena, hematochezia  or hemorrhoids. Genitourinary: Denies dysuria, frequency, urgency, nocturia, hesitancy, discharge, hematuria or flank pain. Musculoskeletal: Denies arthralgias, myalgias, stiffness, jt. swelling, pain, limping or strain/sprain.  Skin: Denies pruritus, rash, hives, warts, acne, eczema or change in skin lesion(s). Neuro: No weakness, tremor, incoordination, spasms, paresthesia or pain. Psychiatric: Denies confusion, memory loss or sensory loss. Endo: Denies change in weight, skin or hair change.  Heme/Lymph: No excessive bleeding, bruising or enlarged lymph nodes.  Physical Exam  BP 120/66   Pulse 84   Temp 97.6 F (36.4 C)   Resp 18   Ht 6\' 3"  (1.905 m)   Wt 246 lb 3.2 oz (111.7 kg)   BMI 30.77 kg/m   Appears  well nourished, well groomed  and in no distress.  Eyes: PERRLA, EOMs, conjunctiva no swelling or erythema. Sinuses: No frontal/maxillary tenderness  ENT/Mouth: EAC's clear, TM's nl w/o erythema, bulging. Nares clear w/o erythema, swelling, exudates. Oropharynx clear without erythema or exudates. Oral hygiene is good. Tongue normal, non obstructing. Hearing intact.  Neck: Supple. Thyroid not palpable. Car 2+/2+ without bruits, nodes or JVD. Chest: Respirations nl with BS clear & equal w/o rales, rhonchi, wheezing or stridor.  Cor: Heart sounds normal w/ regular rate and rhythm without sig. murmurs, gallops, clicks or rubs. Peripheral pulses normal and equal  without edema.  Abdomen: Soft & bowel sounds normal. Non-tender w/o guarding, rebound, hernias, masses or organomegaly.  Lymphatics: Unremarkable.  Musculoskeletal: Full ROM all peripheral extremities, joint stability, 5/5 strength and normal gait.  Skin: Warm, dry without exposed rashes, lesions or ecchymosis apparent.  Neuro: Cranial nerves intact, reflexes equal bilaterally. Sensory-motor testing grossly intact. Tendon reflexes grossly intact.  Pysch: Alert & oriented x 3.  Insight and judgement nl & appropriate. No ideations.  Assessment and Plan:  1. Essential hypertension  - Continue medication, monitor blood pressure at home.  - Continue DASH diet.  Reminder to go to the ER if any CP,  SOB, nausea, dizziness, severe HA, changes vision/speech.  -  CBC with Differential/Platelet - COMPLETE METABOLIC PANEL WITH GFR - Magnesium - TSH  2. Primary sclerosing cholangitis  - TSH  3. Hyperlipidemia, mixed  - Continue diet/meds, exercise,& lifestyle modifications.  - Continue monitor periodic cholesterol/liver & renal functions   - Lipid panel  4. Abnormal glucose  - Continue diet, exercise  - Lifestyle modifications.  - Monitor appropriate labs.  - Hemoglobin A1c - Insulin, random  5. Vitamin D deficiency  - Continue supplementation.  - VITAMIN D 25 Hydroxyl  6. Hypothyroidism, unspecified type  - TSH  7. PSC (primary sclerosing cholangitis)  - COMPLETE  METABOLIC PANEL WITH GFR  8. Protein S deficiency ([place])   9. 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 Medication management  - CBC with Differential/Platelet - COMPLETE METABOLIC PANEL WITH GFR - Magnesium - Lipid panel - TSH - Hemoglobin A1c - Insulin, random - VITAMIN D 25 Hydroxyl        Discussed  regular exercise, BP monitoring, weight control to achieve/maintain BMI less than 25 and discussed med and SE's. Recommended labs to assess and monitor clinical status WERE DECLINED by patient due to lack of insurance coverage.  I discussed the assessment and treatment plan with the patient. The patient was provided an opportunity to ask questions and all were answered. The patient agreed with the plan and demonstrated an understanding of the instructions.  I provided over 30 minutes of exam, counseling, chart review and  complex critical decision making.   Kirtland Bouchard, MD

## 2019-08-04 NOTE — Patient Instructions (Signed)
Primary Biliary Cholangitis  Primary biliary cholangitis is a long-term (chronic) liver disease. Your liver is important for functions such as absorbing nutrients from food, removing waste from the body, and making proteins and substances that help your blood clot. Primary biliary cholangitis destroys the tube-like structures (bile ducts) in the liver that produce the digestive fluid called bile. Bile is necessary for absorbing fats, cholesterol, and fat-soluble vitamins. As bile ducts are destroyed, bile backs up in your liver and causes liver damage. It can lead to scarring of the liver (cirrhosis).  What are the causes?  The exact cause of this condition is not known. It may be an autoimmune disease. If you have an autoimmune disease, your body's defense system (immune system) mistakenly attacks normal cells. In primary biliary cholangitis, the immune system attacks the bile ducts.  What increases the risk?  You are more likely to develop this condition if:  You have a family history of the disease. If you inherited the gene for the disease, something might trigger the gene to become active. Possible triggers include:   ? Cigarette smoke. ? Exposure to toxic chemicals. ? Certain infections.  You are a woman.  You are 69-37 years old.   What are the signs or symptoms?  In the early stages of the disease, you might not show any signs or symptoms. The earliest symptoms are:  Fatigue.  Very itchy skin.  Dry eyes or mouth.  Pain in the abdomen, under your right rib.  Slight yellow appearance in the whites of your eyes or in your skin (jaundice).   Later signs and symptoms develop as problems related to liver damage occur. These can include:  Swelling of the feet and legs (edema).  Swelling of the abdomen (ascites).  Mental confusion and impaired mental functioning (hepatic encephalopathy).  Darkening and increased jaundice of the skin.  Fatty bowel  movements.  Worsening right-sided abdominal pain.  Vomiting blood.   How is this diagnosed?  Your health care provider may suspect primary biliary cholangitis if you have abnormal results in a blood test for liver function. This test is often done as part of a routine physical exam. Your health care provider may also check to see if:   Your liver is enlarged.  Your spleen is enlarged.  You have jaundice.  You have swelling in your legs or abdomen.   Tests may also be done to confirm the diagnosis. These may include:   A blood test. This looks for a specific type of immune system protein (antimitochondrial antibodies, or AMA).  Imaging studies. An ultrasound is used to create pictures of your liver.  Liver biopsy. A piece of tissue from your liver is checked under a microscope.   How is this treated?  For early treatment of this condition, your health care provider will prescribe a bile substitute medicine (ursodiol). You will likely need to take this medicine for the rest of your life. Also, you should avoid:   Alcohol.  Hepatotoxic medicines. These are medicines that are damaging to the liver when taken too often or in excessive amounts. These can include: ? Over-the-counter and prescription medicines. ? Herbal treatments. ? Vitamins. ? Dietary supplements. ?  As primary biliary cholangitis progresses, you may need other treatments, such as:  Antihistamines to relieve itching.  Artificial tears and saliva to relieve dry eyes and mouth.  Medicines that lower blood pressure and reduce swelling (diuretics).  A laxative (lactulose) to remove toxic substances that cause hepatic encephalopathy.  A liver transplant if primary biliary cholangitis develops into cirrhosis. The earlier you start treatment, the more likely it is that your condition will be slowed down. Follow these instructions at home: Lifestyle   Work with a dietitian to create a meal plan that  gives you the right amount of protein and nutrition with less salt.  Get regular exercise. Ask your health care provider what the right amount of exercise is for you.  Do not use any products that contain nicotine or tobacco, such as cigarettes and e-cigarettes. If you need help quitting, ask your health care provider.  Do not drink alcohol.  Do not eat raw shellfish. General instructions  Learn as much as you can about your disease, and work closely with your health care providers.  You may need to have regular blood tests to monitor your liver function.  Take over-the-counter and prescription medicines only as told by your health care provider.  Drink enough fluid to keep your urine pale yellow. Contact a health care provider if:  Your symptoms are gradually getting worse.   Get help right away if:   You vomit blood.  Your itching suddenly worsens or stops.  You develop new jaundice.  You become confused or pass out.  You develop sudden new swelling in your legs or abdomen.   Summary   Primary biliary cholangitis is a chronic liver disease that destroys the bile ducts in the liver. It can lead to cirrhosis. It may be an autoimmune disease.  You are more likely to develop this disease if others in your family have it, if you are male, and if you are 31-30 years old.  Early symptoms include fatigue, itching, and pain in the abdomen. Later symptoms include ascites, edema, worsening jaundice, and hepatic encephalopathy.  Diagnosis of primary biliary cholangitis may involve blood tests, imaging tests such as ultrasound, and liver biopsy.  Medicines are available to treat this condition. You may also be required to limit salt, avoid hepatotoxic medicines, and stop drinking alcohol.   ++++++++++++++++++++++++++++++++++++++  Vit D  & Vit C 1,000 mg   are recommended to help protect  against the Covid-19 and other Corona viruses.    Also it's recommended  to take   Zinc 50 mg  to help  protect against the Covid-19   and best place to get  is also on [employer].com  and don't pay more than 6-8 cents /pill !   ===================================== Coronavirus (COVID-19) Are you at risk?  Are you at risk for the Coronavirus (COVID-19)?  To be considered HIGH RISK for Coronavirus (COVID-19), you have to meet the following criteria:   Traveled to [place], [place], [place], [place] or [place]; or in the [place] to [place], [place], [place]   or [place]; and have fever, cough, and shortness of breath within the last 2 weeks of travel OR  Been in close contact with a person diagnosed with COVID-19 within the last 2 weeks and have   fever, cough,and shortness of breath    IF YOU DO NOT MEET THESE CRITERIA, YOU ARE CONSIDERED LOW RISK FOR COVID-19.  What to do if you are HIGH RISK for COVID-19?   If you are having a medical emergency, call 911.  Seek medical care right away. Before you go to a doctors office, urgent care or emergency department,   call ahead and tell them about your recent travel, contact with someone diagnosed with COVID-19    and your symptoms.  You should receive instructions from your physicians office regarding next steps of care.   When you arrive at healthcare provider, tell the healthcare staff immediately you have returned from   visiting [place], [place], [place], [place] or [place]; or traveled in the [place] to [place], [place],   [place] or [place] in the last two weeks or you have been in close contact with a person diagnosed with   COVID-19 in the last 2 weeks.    Tell the health care staff about your symptoms: fever, cough and shortness of breath.  After you have been seen by a medical provider, you will be either: o Tested for (COVID-19) and discharged home on quarantine except to seek medical care if  o symptoms worsen, and asked to  - Stay home and avoid contact with others  until you get your results (4-5 days)  - Avoid travel on public transportation if possible (such as bus, train, or airplane) or o Sent to the Emergency Department by EMS for evaluation, COVID-19 testing  and  o possible admission depending on your condition and test results.  What to do if you are LOW RISK for COVID-19?  Reduce your risk of any infection by using the same precautions used for avoiding the common cold or flu:   Wash your hands often with soap and warm water for at least 20 seconds.  If soap and water are not readily available,   use an alcohol-based hand sanitizer with at least 60% alcohol.   If coughing or sneezing, cover your mouth and nose by coughing or sneezing into the elbow areas of your shirt or coat,   into a tissue or into your sleeve (not your hands).  Avoid shaking hands with others and consider head nods or verbal greetings only.  Avoid touching your eyes, nose, or mouth with unwashed hands.   Avoid close contact with people who are sick.  Avoid places or events with large numbers of people in one location, like concerts or sporting events.  Carefully consider travel plans you have or are making.  If you are planning any travel outside or inside the [place], visit the [facility] webpage for the latest health notices.  If you have some symptoms but not all symptoms, continue to monitor at home and seek medical attention   if your symptoms worsen.  If you are having a medical emergency, call 911.   ++++++++++++++++++++++++++++++++ Recommend Adult Low Dose Aspirin or  coated  Aspirin 81 mg daily  To reduce risk of Colon Cancer 40 %,  Skin Cancer 26 % ,  Melanoma 46%  and  Pancreatic cancer 60% ++++++++++++++++++++++++++++++++ Vitamin D goal  is between 70-100.  Please make sure that you are taking your Vitamin D as directed.  It is very important as a natural anti-inflammatory  helping hair, skin, and nails, as well as reducing stroke  and heart attack risk.  It helps your bones and helps with mood. It also decreases numerous cancer risks so please take it as directed.  Low Vit D is associated with a 200-300% higher risk for CANCER  and 200-300% higher risk for HEART   ATTACK  &  STROKE.   .....................................Marland Kitchen It is also associated with higher death rate at younger ages,  autoimmune diseases like Rheumatoid arthritis, Lupus, Multiple Sclerosis.    Also many other serious conditions, like depression, Alzheimer's Dementia, infertility, muscle aches, fatigue, fibromyalgia - just to name a few. ++++++++++++++++++++ Recommend  the book "The END of DIETING" by Dr Excell Seltzer  & the book "The END of DIABETES " by Dr Excell Seltzer At  [email or web address] - get book & Audio CD's    Being diabetic has a  300% increased risk for heart attack, stroke, cancer, and alzheimer- type vascular dementia. It is very important that you work harder with diet by avoiding all foods that are white. Avoid white rice (brown & wild rice is OK), white potatoes (sweetpotatoes in moderation is OK), White bread or wheat bread or anything made out of white flour like bagels, donuts, rolls, buns, biscuits, cakes, pastries, cookies, pizza crust, and pasta (made from white flour & egg whites) - vegetarian pasta or spinach or wheat pasta is OK. Multigrain breads like Arnold's or Pepperidge [employer], or multigrain sandwich thins or flatbreads.  Diet, exercise and weight loss can reverse and cure diabetes in the early stages.  Diet, exercise and weight loss is very important in the control and prevention of complications of diabetes which affects every system in your body, ie. Brain - dementia/stroke, eyes - glaucoma/blindness, heart - heart attack/heart failure, kidneys - dialysis, stomach - gastric paralysis, intestines - malabsorption, nerves - severe painful neuritis, circulation - gangrene & loss of a leg(s), and finally cancer and Alzheimers.    I recommend  avoid fried & greasy foods,  sweets/candy, white rice (brown or wild rice or Quinoa is OK), white potatoes (sweet potatoes are OK) - anything made from white flour - bagels, doughnuts, rolls, buns, biscuits,white and wheat breads, pizza crust and traditional pasta made of white flour & egg white(vegetarian pasta or spinach or wheat pasta is OK).  Multi-grain bread is OK - like multi-grain flat bread or sandwich thins. Avoid alcohol in excess. Exercise is also important.    Eat all the vegetables you want - avoid meat, especially red meat and dairy - especially cheese.  Cheese is the most concentrated form of trans-fats which is the worst thing to clog up our arteries. Veggie cheese is OK which can be found in the fresh produce section at [employer] or [employer] or [employer]  +++++++++++++++++++++ DASH Eating Plan  DASH stands for "Dietary Approaches to Stop Hypertension."   The DASH eating plan is a healthy eating plan that has been shown to reduce high blood pressure (hypertension). Additional health benefits may include reducing the risk of type 2 diabetes mellitus, heart disease, and stroke. The DASH eating plan may also help with weight loss. WHAT DO I NEED TO KNOW ABOUT THE DASH EATING PLAN? For the DASH eating plan, you will follow these general guidelines:  Choose foods with a percent daily value for sodium of less than 5% (as listed on the food label).  Use salt-free seasonings or herbs instead of table salt or sea salt.  Check with your health care provider or pharmacist before using salt substitutes.  Eat lower-sodium products, often labeled as "lower sodium" or "no salt added."  Eat fresh foods.  Eat more vegetables, fruits, and low-fat dairy products.  Choose whole grains. Look for the word "whole" as the first word in the ingredient list.  Choose fish   Limit sweets, desserts, sugars, and sugary drinks.  Choose heart-healthy fats.  Eat veggie cheese   Eat more  home-cooked food and less restaurant, buffet, and fast food.  Limit fried foods.  Cook foods using methods other than frying.  Limit canned vegetables. If you do use them, rinse them well to decrease the sodium.  When eating at a restaurant, ask that your food be prepared with less salt, or no salt if possible.                      WHAT FOODS CAN I EAT? Read Dr Fara Olden Fuhrman's books on The End of Dieting & The End of Diabetes  Grains Whole grain or whole wheat bread. Brown rice. Whole grain or whole wheat pasta. Quinoa, bulgur, and whole grain cereals. Low-sodium cereals. Corn or whole wheat flour tortillas. Whole grain cornbread. Whole grain crackers. Low-sodium crackers.  Vegetables Fresh or frozen vegetables (raw, steamed, roasted, or grilled). Low-sodium or reduced-sodium tomato and vegetable juices. Low-sodium or reduced-sodium tomato sauce and paste. Low-sodium or reduced-sodium canned vegetables.   Fruits All fresh, canned (in natural juice), or frozen fruits.  Protein Products  All fish and seafood.  Dried beans, peas, or lentils. Unsalted nuts and seeds. Unsalted canned beans.  Dairy Low-fat dairy products, such as skim or 1% milk, 2% or reduced-fat cheeses, low-fat ricotta or cottage cheese, or plain low-fat yogurt. Low-sodium or reduced-sodium cheeses.  Fats and Oils Tub margarines without trans fats. Light or reduced-fat mayonnaise and salad dressings (reduced sodium). Avocado. Safflower, olive, or canola oils. Natural peanut or almond butter.  Other Unsalted popcorn and pretzels. The items listed above may not be a complete list of recommended foods or beverages. Contact your dietitian for more options.  +++++++++++++++  WHAT FOODS ARE NOT RECOMMENDED? Grains/ White flour or wheat flour White bread. White pasta. White rice. Refined cornbread. Bagels and croissants. Crackers that contain trans fat.  Vegetables  Creamed or fried vegetables. Vegetables in a .  Regular canned vegetables. Regular canned tomato sauce and paste. Regular tomato and vegetable juices.  Fruits Dried fruits. Canned fruit in light or heavy syrup. Fruit juice.  Meat and Other Protein Products Meat in general - RED meat & White meat.  Fatty cuts of meat. Ribs, chicken wings, all processed meats as bacon, sausage, bologna, salami, fatback, hot dogs, bratwurst and packaged luncheon meats.  Dairy Whole or 2% milk, cream, half-and-half, and cream cheese. Whole-fat or sweetened yogurt. Full-fat cheeses or blue cheese. Non-dairy creamers and whipped toppings. Processed cheese, cheese spreads, or cheese curds.  Condiments Onion and garlic salt, seasoned salt, table salt, and sea salt. Canned and packaged gravies. Worcestershire sauce. Tartar sauce. Barbecue sauce. Teriyaki sauce. Soy sauce, including reduced sodium. Steak sauce. Fish sauce. Oyster sauce. Cocktail sauce. Horseradish. Ketchup and mustard. Meat flavorings and tenderizers. Bouillon cubes. Hot sauce. Tabasco sauce. Marinades. Taco seasonings. Relishes.  Fats and Oils Butter, stick margarine, lard, shortening and bacon fat. Coconut, palm kernel, or palm oils. Regular salad dressings.  Pickles and olives. Salted popcorn and pretzels.  The items listed above may not be a complete list of foods and beverages to avoid.

## 2019-08-05 ENCOUNTER — Ambulatory Visit (INDEPENDENT_AMBULATORY_CARE_PROVIDER_SITE_OTHER): Payer: Self-pay | Admitting: Internal Medicine

## 2019-08-05 ENCOUNTER — Other Ambulatory Visit: Payer: Self-pay

## 2019-08-05 VITALS — BP 120/66 | HR 84 | Temp 97.6°F | Resp 18 | Ht 75.0 in | Wt 246.2 lb

## 2019-08-05 DIAGNOSIS — D6859 Other primary thrombophilia: Secondary | ICD-10-CM

## 2019-08-05 DIAGNOSIS — R7309 Other abnormal glucose: Secondary | ICD-10-CM

## 2019-08-05 DIAGNOSIS — I10 Essential (primary) hypertension: Secondary | ICD-10-CM

## 2019-08-05 DIAGNOSIS — K55069 Acute infarction of intestine, part and extent unspecified: Secondary | ICD-10-CM

## 2019-08-05 DIAGNOSIS — K8301 Primary sclerosing cholangitis: Secondary | ICD-10-CM

## 2019-08-05 DIAGNOSIS — E039 Hypothyroidism, unspecified: Secondary | ICD-10-CM

## 2019-08-05 DIAGNOSIS — Z79899 Other long term (current) drug therapy: Secondary | ICD-10-CM

## 2019-08-05 DIAGNOSIS — E782 Mixed hyperlipidemia: Secondary | ICD-10-CM

## 2019-08-05 DIAGNOSIS — E559 Vitamin D deficiency, unspecified: Secondary | ICD-10-CM

## 2019-08-06 ENCOUNTER — Encounter (INDEPENDENT_AMBULATORY_CARE_PROVIDER_SITE_OTHER): Payer: Self-pay

## 2019-08-12 ENCOUNTER — Other Ambulatory Visit: Payer: Self-pay | Admitting: Internal Medicine

## 2019-08-12 DIAGNOSIS — F1920 Other psychoactive substance dependence, uncomplicated: Secondary | ICD-10-CM

## 2019-08-12 DIAGNOSIS — F332 Major depressive disorder, recurrent severe without psychotic features: Secondary | ICD-10-CM

## 2019-08-14 ENCOUNTER — Telehealth (HOSPITAL_COMMUNITY): Payer: Self-pay | Admitting: Psychiatry

## 2019-08-14 NOTE — Telephone Encounter (Signed)
D:  Nita Sells (CMA) referred pt to [facility].  A:  Placed call to orient pt and provide him with a start date.  There was no answer and vm didn't come on.  Will inform Janett Billow.

## 2019-08-15 ENCOUNTER — Other Ambulatory Visit: Payer: Self-pay | Admitting: Internal Medicine

## 2019-08-15 DIAGNOSIS — F332 Major depressive disorder, recurrent severe without psychotic features: Secondary | ICD-10-CM

## 2019-08-20 ENCOUNTER — Other Ambulatory Visit: Payer: Self-pay | Admitting: Nurse Practitioner

## 2019-08-20 DIAGNOSIS — K8301 Primary sclerosing cholangitis: Secondary | ICD-10-CM

## 2019-08-22 ENCOUNTER — Other Ambulatory Visit: Payer: Self-pay

## 2019-08-22 DIAGNOSIS — E039 Hypothyroidism, unspecified: Secondary | ICD-10-CM

## 2019-08-22 MED ORDER — LEVOTHYROXINE SODIUM 100 MCG PO TABS: ORAL_TABLET | ORAL | 0 refills | Status: DC

## 2019-08-26 ENCOUNTER — Other Ambulatory Visit: Payer: Self-pay | Admitting: Physician Assistant

## 2019-08-26 DIAGNOSIS — I10 Essential (primary) hypertension: Secondary | ICD-10-CM

## 2019-09-20 ENCOUNTER — Other Ambulatory Visit: Payer: Self-pay

## 2019-10-17 ENCOUNTER — Other Ambulatory Visit: Payer: Self-pay

## 2019-10-17 DIAGNOSIS — R112 Nausea with vomiting, unspecified: Secondary | ICD-10-CM

## 2019-10-17 DIAGNOSIS — I10 Essential (primary) hypertension: Secondary | ICD-10-CM

## 2019-10-17 MED ORDER — BISOPROLOL-HYDROCHLOROTHIAZIDE 10-6.25 MG PO TABS: ORAL_TABLET | ORAL | 0 refills | Status: DC

## 2019-10-17 MED ORDER — ONDANSETRON HCL 8 MG PO TABS: ORAL_TABLET | ORAL | 0 refills | Status: DC

## 2019-10-20 ENCOUNTER — Other Ambulatory Visit: Payer: Self-pay

## 2019-10-20 ENCOUNTER — Telehealth: Payer: Self-pay

## 2019-10-20 DIAGNOSIS — R1084 Generalized abdominal pain: Secondary | ICD-10-CM

## 2019-10-20 NOTE — Telephone Encounter (Signed)
-----   Message from Hughie Closs, RN sent at 05/05/2019  8:31 AM EDT ----- Put order in Epic and call pt. To come in for LFTs (6 month F/U) = 11/02/19

## 2019-10-20 NOTE — Telephone Encounter (Signed)
Called and spoke to patient's Mom. Asked him to come into our lab for his 6 month LFTs.

## 2019-11-12 ENCOUNTER — Other Ambulatory Visit: Payer: Self-pay | Admitting: Internal Medicine

## 2019-11-12 DIAGNOSIS — F332 Major depressive disorder, recurrent severe without psychotic features: Secondary | ICD-10-CM

## 2019-11-12 MED ORDER — BUSPIRONE HCL 10 MG PO TABS: ORAL_TABLET | ORAL | 0 refills | Status: DC

## 2019-11-18 ENCOUNTER — Other Ambulatory Visit: Payer: Self-pay

## 2019-11-18 ENCOUNTER — Encounter: Payer: Self-pay | Admitting: Internal Medicine

## 2019-11-18 ENCOUNTER — Ambulatory Visit (INDEPENDENT_AMBULATORY_CARE_PROVIDER_SITE_OTHER): Payer: Self-pay | Admitting: Internal Medicine

## 2019-11-18 VITALS — BP 128/80 | HR 76 | Temp 97.9°F | Resp 16 | Ht 75.0 in | Wt 230.6 lb

## 2019-11-18 DIAGNOSIS — Z79899 Other long term (current) drug therapy: Secondary | ICD-10-CM

## 2019-11-18 DIAGNOSIS — I10 Essential (primary) hypertension: Secondary | ICD-10-CM

## 2019-11-18 DIAGNOSIS — R634 Abnormal weight loss: Secondary | ICD-10-CM

## 2019-11-18 DIAGNOSIS — F332 Major depressive disorder, recurrent severe without psychotic features: Secondary | ICD-10-CM

## 2019-11-18 DIAGNOSIS — E039 Hypothyroidism, unspecified: Secondary | ICD-10-CM

## 2019-11-18 NOTE — Progress Notes (Signed)
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       Patient has known hx/o Depression and heavy Marijuana use presents now with 2 complaints. Kyle Gonzales st is concern of ED not able to achieve a firm erection. The 2sd is that he asks for referral for counseling and is very evasive for the reason stating that he doesn't want everybody to find out what his concerns are. He also request labs relating concerns over a 8-10 #weight loss (actually 15-20# over the last year).   Wt Readings from Last 3 Encounters:  11/18/19 230 lb 9.6 oz (104.6 kg)  08/05/19 246 lb 3.2 oz (111.7 kg)  04/29/19 249 lb (112.9 kg)   Medications   .  levothyroxine (SYNTHROID) 100 MCG tablet, Take 1 tablet daily on an empty stomach with only water for 30 minutes & no Antacid meds, Calcium or Magnesium for 4 hours & avoid Biotin .  bisoprolol-hydrochlorothiazide (ZIAC) 10-6.25 MG tablet, Take 1 tablet Daily for BP .  ezetimibe (ZETIA) 10 MG tablet, TAKE ONE TABLET BY MOUTH DAILY FOR CHOLESTEROL .  apixaban (ELIQUIS) 5 MG TABS tablet, TAKE 1 TABLET(5 MG) BY MOUTH TWICE DAILY .  alum & mag hydroxide-simeth (MAALOX ADVANCED MAX ST) 400-400-40 MG/5ML suspension, Take 15 mLs by mouth every 6 (six) hours as needed for indigestion. .  busPIRone (BUSPAR) 10 MG tablet, Take 1 tablet 3 x /day for Anxiety .  Cholecalciferol (VITAMIN D3) 5000 units CAPS, Take 1 capsule (5,000 Units total) by mouth every other day. .  escitalopram (LEXAPRO) 20 MG tablet, Take 1 tablet Daily for Mood, Anxiety, Irritability  & Depression .  omeprazole (PRILOSEC) 40 MG capsule, TAKE 1 CAPSULE BY MOUTH EVERY MORNING BEFORE BREAKFAST .  ondansetron (ZOFRAN) 8 MG tablet,  Take 1 tablet 3 x /day . . . ONLY . Marland Kitchen .  if needed for Nausea  Problem list He has Vitamin D deficiency; Graves disease; Palpitations; Hypothyroidism; Hyperlipidemia, mixed; Drug addiction / Marajuana ([place]); Transaminitis; Abnormal LFTs; Non-compliance; MDD (major depressive disorder), recurrent severe, without psychosis ([place]); Hypertension; Abdominal pain; Primary sclerosing cholangitis; DVT (deep venous thrombosis) ([place]); Abnormal liver enzymes; Abnormal magnetic resonance cholangiopancreatography (MRCP); Bile duct obstruction; Protein S deficiency ([facility]); and Abnormal glucose on their problem list.   Observations/Objective:  BP 128/80   Pulse 76   Temp 97.9 F (36.6 C)   Resp 16   Ht 6\' 3"  (1.905 m)   Wt 230 lb 9.6 oz (104.6 kg)   BMI 28.82 kg/m   HEENT - WNL. Neck - supple.  Chest - Clear equal BS. Cor - Nl HS. RRR w/o sig MGR. PP 1(+). No edema. MS- FROM w/o deformities.  Gait Nl. Neuro -  Nl w/o focal abnormalities. Psych - Very flat depressed affect . Denies SI/HI. No Delusions , ideations or psychoses admitted.   Assessment and Plan:  1. Essential hypertension  - CBC with Differential/Platelet - COMPLETE METABOLIC PANEL WITH GFR - TSH  2. Hypothyroidism  - TSH  3. MDD (major depressive disorder), recurrent severe, without psychosis ([place])   4. Weight loss  - CBC with Differential/Platelet - COMPLETE METABOLIC PANEL WITH GFR - TSH  5. Medication management  - CBC  with Differential/Platelet - COMPLETE METABOLIC PANEL WITH GFR - TSH  Follow Up Instructions:  - Patient is given name & phone # to Call Psychologist Dr Altha Harm for counseling.    - Patient is given a Rx for Cialis 20 mg to try for his ED & discussed med effects & side-effects. I discussed the assessment and treatment plan with the patient. The patient was provided an opportunity to ask questions and all were answered. The patient agreed with the plan and demonstrated an understanding of the  instructions.       The patient was advised to call back or seek an in-person evaluation if the symptoms worsen or if the condition fails to improve as anticipated.  Kirtland Bouchard, MD

## 2019-11-19 ENCOUNTER — Other Ambulatory Visit: Payer: Self-pay | Admitting: *Deleted

## 2019-11-19 DIAGNOSIS — E039 Hypothyroidism, unspecified: Secondary | ICD-10-CM

## 2019-11-19 LAB — CBC WITH DIFFERENTIAL/PLATELET
Absolute Monocytes: 393 cells/uL (ref 200–950)
Basophils Absolute: 23 cells/uL (ref 0–200)
Basophils Relative: 0.4 %
Eosinophils Absolute: 29 cells/uL (ref 15–500)
Eosinophils Relative: 0.5 %
HCT: 45.6 % (ref 38.5–50.0)
Hemoglobin: 15.6 g/dL (ref 13.2–17.1)
Lymphs Abs: 1300 cells/uL (ref 850–3900)
MCH: 31.8 pg (ref 27.0–33.0)
MCHC: 34.2 g/dL (ref 32.0–36.0)
MCV: 92.9 fL (ref 80.0–100.0)
MPV: 11 fL (ref 7.5–12.5)
Monocytes Relative: 6.9 %
Neutro Abs: 3956 cells/uL (ref 1500–7800)
Neutrophils Relative %: 69.4 %
Platelets: 156 10*3/uL (ref 140–400)
RBC: 4.91 10*6/uL (ref 4.20–5.80)
RDW: 13 % (ref 11.0–15.0)
Total Lymphocyte: 22.8 %
WBC: 5.7 10*3/uL (ref 3.8–10.8)

## 2019-11-19 LAB — COMPLETE METABOLIC PANEL WITH GFR
AG Ratio: 1.6 (calc) (ref 1.0–2.5)
ALT: 34 U/L (ref 9–46)
AST: 19 U/L (ref 10–40)
Albumin: 5 g/dL (ref 3.6–5.1)
Alkaline phosphatase (APISO): 73 U/L (ref 36–130)
BUN: 13 mg/dL (ref 7–25)
CO2: 29 mmol/L (ref 20–32)
Calcium: 10.1 mg/dL (ref 8.6–10.3)
Chloride: 102 mmol/L (ref 98–110)
Creat: 1.07 mg/dL (ref 0.60–1.35)
GFR, Est African American: 110 mL/min/{1.73_m2} (ref >=60)
GFR, Est Non African American: 95 mL/min/{1.73_m2} (ref >=60)
Globulin: 3.1 g/dL (calc) (ref 1.9–3.7)
Glucose, Bld: 101 mg/dL — ABNORMAL HIGH (ref 65–99)
Potassium: 4.4 mmol/L (ref 3.5–5.3)
Sodium: 140 mmol/L (ref 135–146)
Total Bilirubin: 0.6 mg/dL (ref 0.2–1.2)
Total Protein: 8.1 g/dL (ref 6.1–8.1)

## 2019-11-19 LAB — TSH: TSH: 7.79 mIU/L — ABNORMAL HIGH (ref 0.40–4.50)

## 2019-11-19 MED ORDER — LEVOTHYROXINE SODIUM 100 MCG PO TABS: ORAL_TABLET | ORAL | 0 refills | Status: DC

## 2019-11-29 ENCOUNTER — Ambulatory Visit (HOSPITAL_COMMUNITY)
Admission: AD | Admit: 2019-11-29 | Discharge: 2019-11-29 | Disposition: A | Discharge status: Home / Self Care | Payer: Self-pay | Attending: Psychiatry | Admitting: Psychiatry

## 2019-11-29 DIAGNOSIS — F332 Major depressive disorder, recurrent severe without psychotic features: Secondary | ICD-10-CM | POA: Insufficient documentation

## 2019-11-29 DIAGNOSIS — F419 Anxiety disorder, unspecified: Secondary | ICD-10-CM | POA: Insufficient documentation

## 2019-11-29 DIAGNOSIS — Z79899 Other long term (current) drug therapy: Secondary | ICD-10-CM | POA: Insufficient documentation
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
## 2019-12-28 ENCOUNTER — Other Ambulatory Visit: Payer: Self-pay

## 2019-12-28 DIAGNOSIS — F332 Major depressive disorder, recurrent severe without psychotic features: Secondary | ICD-10-CM

## 2019-12-28 DIAGNOSIS — R112 Nausea with vomiting, unspecified: Secondary | ICD-10-CM

## 2019-12-28 MED ORDER — EZETIMIBE 10 MG PO TABS: ORAL_TABLET | ORAL | 0 refills | Status: DC

## 2019-12-28 MED ORDER — ONDANSETRON HCL 8 MG PO TABS: ORAL_TABLET | ORAL | 0 refills | Status: DC

## 2019-12-28 MED ORDER — BUSPIRONE HCL 10 MG PO TABS: ORAL_TABLET | ORAL | 1 refills | Status: DC

## 2020-01-01 NOTE — Progress Notes (Deleted)
   Subjective:    Patient ID: Kyle Gonzales, male    DOB: May 10, 1991, 29 y.o.   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       Patient has known hx/o Depression and heavy Marijuana use presents now with 2 complaints. [address] st is concern of ED not able to achieve a firm erection. The 2sd is that he asks for referral for counseling and is very evasive for the reason stating that he doesn't want everybody to find out what his concerns are. He also request labs relating concerns over a 8-10 #weight loss (actually 15-20# over the last year).   There were no vitals taken for this visit.  Medications  Current Outpatient Medications (Endocrine & Metabolic):  .  levothyroxine (SYNTHROID) 100 MCG tablet, Take 1 tablet daily on an empty stomach with only water for 30 minutes & no Antacid meds, Calcium or Magnesium for 4 hours & avoid Biotin  Current Outpatient Medications (Cardiovascular):  .  bisoprolol-hydrochlorothiazide (ZIAC) 10-6.25 MG tablet, Take 1 tablet Daily for BP .  ezetimibe (ZETIA) 10 MG tablet, Take 1 tablet Daily for Cholesterol    Current Outpatient Medications (Hematological):  .  apixaban (ELIQUIS) 5 MG TABS tablet, TAKE 1 TABLET(5 MG) BY MOUTH TWICE DAILY  Current Outpatient Medications (Other):  .  alum & mag hydroxide-simeth (MAALOX ADVANCED MAX ST) 400-400-40 MG/5ML suspension, Take 15 mLs by mouth every 6 (six) hours as needed for indigestion. .  busPIRone (BUSPAR) 10 MG tablet, Take 1 tablet 3 x /day for Anxiety .  Cholecalciferol (VITAMIN D3) 5000 units CAPS, Take 1 capsule (5,000 Units total) by mouth every other day. .  escitalopram (LEXAPRO) 20 MG tablet, Take 1 tablet Daily for Mood, Anxiety, Irritability  & Depression .  omeprazole (PRILOSEC) 40 MG capsule, TAKE 1 CAPSULE BY MOUTH EVERY MORNING  BEFORE BREAKFAST .  ondansetron (ZOFRAN) 8 MG tablet, Take 1 tablet 3 x /day . . . ONLY . Marland Kitchen .  if needed for Nausea  Problem list He has Vitamin D deficiency; Graves disease; Palpitations; Hypothyroidism; Hyperlipidemia, mixed; Drug addiction / Marajuana ([place]); Transaminitis; Abnormal LFTs; Non-compliance; MDD (major depressive disorder), recurrent severe, without psychosis ([place]); Hypertension; Abdominal pain; Primary sclerosing cholangitis; DVT (deep venous thrombosis) ([place]); Abnormal liver enzymes; Abnormal magnetic resonance cholangiopancreatography (MRCP); Bile duct obstruction; Protein S deficiency ([place]); and Abnormal glucose on their problem list.    Review of Systems     Objective:   Physical Exam        Assessment & Plan:

## 2020-01-02 ENCOUNTER — Ambulatory Visit: Payer: Self-pay | Admitting: Physician Assistant

## 2020-01-19 ENCOUNTER — Other Ambulatory Visit (INDEPENDENT_AMBULATORY_CARE_PROVIDER_SITE_OTHER): Payer: Self-pay

## 2020-01-19 DIAGNOSIS — R1084 Generalized abdominal pain: Secondary | ICD-10-CM

## 2020-01-19 LAB — HEPATIC FUNCTION PANEL
ALT: 119 U/L — ABNORMAL HIGH (ref 0–53)
AST: 24 U/L (ref 0–37)
Albumin: 4.7 g/dL (ref 3.5–5.2)
Alkaline Phosphatase: 76 U/L (ref 39–117)
Bilirubin, Direct: 0.1 mg/dL (ref 0.0–0.3)
Total Bilirubin: 0.5 mg/dL (ref 0.2–1.2)
Total Protein: 7.5 g/dL (ref 6.0–8.3)

## 2020-01-29 ENCOUNTER — Ambulatory Visit: Payer: Self-pay | Admitting: Internal Medicine

## 2020-02-16 ENCOUNTER — Other Ambulatory Visit: Payer: Self-pay | Admitting: Internal Medicine

## 2020-02-16 DIAGNOSIS — E039 Hypothyroidism, unspecified: Secondary | ICD-10-CM

## 2020-02-16 MED ORDER — LEVOTHYROXINE SODIUM 100 MCG PO TABS: ORAL_TABLET | ORAL | 0 refills | Status: DC

## 2020-02-19 ENCOUNTER — Other Ambulatory Visit: Payer: Self-pay

## 2020-02-19 ENCOUNTER — Other Ambulatory Visit: Payer: Self-pay | Admitting: Gastroenterology

## 2020-02-19 DIAGNOSIS — K219 Gastro-esophageal reflux disease without esophagitis: Secondary | ICD-10-CM

## 2020-02-19 DIAGNOSIS — I10 Essential (primary) hypertension: Secondary | ICD-10-CM

## 2020-02-19 MED ORDER — BISOPROLOL-HYDROCHLOROTHIAZIDE 10-6.25 MG PO TABS: ORAL_TABLET | ORAL | 0 refills | Status: DC

## 2020-02-26 ENCOUNTER — Telehealth: Payer: Self-pay | Admitting: *Deleted

## 2020-02-26 NOTE — Telephone Encounter (Signed)
Advised mother OK for patient to receive Covid vaccine.

## 2020-02-26 NOTE — Telephone Encounter (Signed)
Patient's mother Trinna Post, called and asked if it is safe for the patient to take the Covid vaccine, given his diagnosis and medications.

## 2020-03-17 ENCOUNTER — Other Ambulatory Visit: Payer: Self-pay

## 2020-03-17 DIAGNOSIS — R112 Nausea with vomiting, unspecified: Secondary | ICD-10-CM

## 2020-03-17 MED ORDER — ONDANSETRON HCL 8 MG PO TABS: ORAL_TABLET | ORAL | 0 refills | Status: DC

## 2020-05-04 NOTE — Progress Notes (Signed)
History of Present Illness:      This very nice 29 y.o.  Single BM with hx/o HTN, HLD, Hypothyroidism, PreDiabetes, Depression a & Vit D Deficiency.  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 In Nov 2019, he had ERCP/Sphincterotomy by Dr Ardis Hughs and patient is followed also by Dr Havery Moros.       Patient is apparently seeing a counselor referred via [facility], but he's not certain of her name, where her office is or how often he sees her as his mother is handling his appointments and transportation. He feels the Buspar 10 mg is not strong enough for his anxiety and only take his Lexapro sporadically. He feels he is having "thoughts (.... or voices) in his head that are very distressing" to him".       Patient is treated for HTN (2016)  & BP has been controlled at home. Today's BP is at goal - 120/84. Patient has had no complaints of any cardiac type chest pain, palpitations, dyspnea / orthopnea / PND, dizziness, claudication, or dependent edema.      Hyperlipidemia is controlled with diet & Zetia. Statins have been avoided due to his liver disease. Patient denies myalgias or other med SE's. Last Lipids were not at goal:  Lab Results  Component Value Date   CHOL 178 06/05/2018   HDL 49 06/05/2018   LDLCALC 110 (H) 06/05/2018   TRIG 93 06/05/2018   CHOLHDL 3.6 06/05/2018    Also, the patient has history of  Moderate obesity (BMI 32+) and he's been monitored expectantly for glucose intolerance / PreDiabetes and has had no symptoms of reactive hypoglycemia, diabetic polys, paresthesias or visual blurring.  Last A1c was Normal  & at goal:  Lab Results  Component Value Date   HGBA1C 5.5 02/04/2018         Patient was dx'd w/Graves disease in  2016 and treated with RAI-131*   in Feb 2017 and he was  started on thyroid replacement.           Further, the patient also has history of Vitamin D Deficiency ("7" / 2016)  and supplements vitamin D without any suspected side-effects. Last vitamin D was at goal:  Lab Results  Component Value Date   VD25OH 67 02/04/2018    Current Outpatient Medications on File Prior to Visit  Medication Sig  . alum & mag hydroxide-simeth (MAALOX ADVANCED MAX ST) 400-400-40 MG/5ML suspension Take 15 mLs by mouth every 6 (six) hours as needed for indigestion.  Marland Kitchen apixaban (ELIQUIS) 5 MG TABS tablet TAKE 1 TABLET(5 MG) BY MOUTH TWICE DAILY  . bisoprolol-hydrochlorothiazide (ZIAC) 10-6.25 MG tablet Take 1 tablet Daily for BP  . busPIRone (BUSPAR) 10 MG tablet Take 1 tablet 3 x /day for Anxiety  . Cholecalciferol (VITAMIN D3) 5000 units CAPS Take 1 capsule (5,000 Units total) by mouth every other day.  . escitalopram (LEXAPRO) 20 MG tablet Take 1 tablet Daily for Mood, Anxiety, Irritability  & Depression  . ezetimibe (ZETIA) 10 MG tablet Take 1 tablet Daily for Cholesterol  . levothyroxine (SYNTHROID) 100 MCG tablet Take 1 tablet daily on an empty stomach with only water for 30 minutes & no Antacid meds, Calcium or Magnesium for 4 hours & avoid Biotin  . omeprazole (PRILOSEC) 40 MG capsule TAKE ONE  CAPSULE BY MOUTH EVERY MORNING BEFORE BREAKFAST  . ondansetron (ZOFRAN) 8 MG tablet Take 1 tablet 3 x /day . . . ONLY . Marland Kitchen .  if needed for Nausea   No current facility-administered medications on file prior to visit.    Allergies  Allergen Reactions  . Tylenol [Acetaminophen] Other (See Comments)    r/t elevated LFTs     PMHx:   Past Medical History:  Diagnosis Date  . GERD (gastroesophageal reflux disease)   . Graves disease 08/18/2015  . Hyperlipidemia   . Hypothyroid   . Palpitations   . PSC (primary sclerosing cholangitis)   . Thrombosis    superior mesenteric vein - 2019  . Vitamin D deficiency     Immunization History  Administered Date(s)  Administered  . PPD Test 12/06/2015  . Td 10/16/2008    Past Surgical History:  Procedure Laterality Date  . ANTERIOR CRUCIATE LIGAMENT REPAIR  2012  . ERCP N/A 08/19/2018   Procedure: ENDOSCOPIC RETROGRADE CHOLANGIOPANCREATOGRAPHY (ERCP);  Surgeon: Milus Banister, MD;  Location: [facility];  Service: Endoscopy;  Laterality: N/A;  . REMOVAL OF STONES  08/19/2018   Procedure: REMOVAL OF STONES;  Surgeon: Milus Banister, MD;  Location: [facility];  Service: Endoscopy;;  . Joan Mayans  08/19/2018   Procedure: Joan Mayans;  Surgeon: Milus Banister, MD;  Location: [facility];  Service: Endoscopy;;  . WISDOM TOOTH EXTRACTION      FHx:    Reviewed / unchanged  SHx:    Reviewed / unchanged   Systems Review:  Constitutional: Denies fever, chills, wt changes, headaches, insomnia, fatigue, night sweats, change in appetite. Eyes: Denies redness, blurred vision, diplopia, discharge, itchy, watery eyes.  ENT: Denies discharge, congestion, post nasal drip, epistaxis, sore throat, earache, hearing loss, dental pain, tinnitus, vertigo, sinus pain, snoring.  CV: Denies chest pain, palpitations, irregular heartbeat, syncope, dyspnea, diaphoresis, orthopnea, PND, claudication or edema. Respiratory: denies cough, dyspnea, DOE, pleurisy, hoarseness, laryngitis, wheezing.  Gastrointestinal: Denies dysphagia, odynophagia, heartburn, reflux, water brash, abdominal pain or cramps, nausea, vomiting, bloating, diarrhea, constipation, hematemesis, melena, hematochezia  or hemorrhoids. Genitourinary: Denies dysuria, frequency, urgency, nocturia, hesitancy, discharge, hematuria or flank pain. Musculoskeletal: Denies arthralgias, myalgias, stiffness, jt. swelling, pain, limping or strain/sprain.  Skin: Denies pruritus, rash, hives, warts, acne, eczema or change in skin lesion(s). Neuro: No weakness, tremor, incoordination, spasms, paresthesia or pain. Psychiatric: Denies confusion, memory loss or  sensory loss. Endo: Denies change in weight, skin or hair change.  Heme/Lymph: No excessive bleeding, bruising or enlarged lymph nodes.  Physical Exam  BP 120/84   Pulse (!) 108   Temp (!) 96.5 F (35.8 C)   Resp 16   Ht 6\' 3"  (1.905 m)   Wt 244 lb 3.2 oz (110.8 kg)   BMI 30.52 kg/m   Appears  over nourished, well groomed  and in no distress.  Eyes: PERRLA, EOMs, conjunctiva no swelling or erythema. Sinuses: No frontal/maxillary tenderness ENT/Mouth: EAC's clear, TM's nl w/o erythema, bulging. Nares clear w/o erythema, swelling, exudates. Oropharynx clear without erythema or exudates. Oral hygiene is good. Tongue normal, non obstructing. Hearing intact.  Neck: Supple. Thyroid not palpable. Car 2+/2+ without bruits, nodes or JVD. Chest: Respirations nl with BS clear & equal w/o rales, rhonchi, wheezing or stridor.  Cor: Heart sounds normal w/ regular rate and rhythm without sig. murmurs, gallops, clicks or rubs. Peripheral pulses normal and equal  without edema.  Abdomen: Soft & bowel sounds normal. Non-tender w/o guarding, rebound, hernias, masses  or organomegaly.  Lymphatics: Unremarkable.  Musculoskeletal: Full ROM all peripheral extremities, joint stability, 5/5 strength and normal gait.  Skin: Warm, dry without exposed rashes, lesions or ecchymosis apparent.  Neuro: Cranial nerves intact, reflexes equal bilaterally. Sensory-motor testing grossly intact. Tendon reflexes grossly intact.  Pysch: Alert & oriented x 3.  Insight and judgement nl & appropriate. No ideations.  Assessment and Plan:  1. Essential hypertension  - Continue medication, monitor blood pressure at home.  - Continue DASH diet.  Reminder to go to the ER if any CP,  SOB, nausea, dizziness, severe HA, changes vision/speech.  - CBC with Differential/Platelet - COMPLETE METABOLIC PANEL WITH GFR - Magnesium - TSH  2. Hyperlipidemia, mixed  - Continue diet/meds, exercise,& lifestyle modifications.  -  Continue monitor periodic cholesterol/liver & renal functions   - Lipid panel - TSH  3. Abnormal glucose  - Continue diet, exercise  - Lifestyle modifications.  - Monitor appropriate labs.  - Hemoglobin A1c  4. Vitamin D deficiency  - Continue supplementation.  - VITAMIN D 25 Hydroxy  5. Chronic anxiety  6. Current mild episode of major depressive disorder ([place])  7. Hypothyroidism, unspecified type  - TSH  8. PSC (primary sclerosing cholangitis)  - COMPLETE METABOLIC PANEL WITH GFR  9. Protein S deficiency ([place])  - CBC with Differential/Platelet  10. 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 Medication management  - CBC with Differential/Platelet - COMPLETE METABOLIC PANEL WITH GFR - Magnesium - Lipid panel - TSH - Hemoglobin A1c - VITAMIN D 25 Hydroxy       Discussed  regular exercise, BP monitoring, weight control to achieve/maintain BMI less than 25 and discussed med and SE's. Recommended labs to assess and monitor clinical status with further disposition pending results of labs.  I discussed the assessment and treatment plan with the patient. The patient was provided an opportunity to ask questions and all were answered.        Patient is encouraged to contact his counselor for close f/u re; his anxieties.    The patient agreed with the plan and demonstrated an understanding of the instructions.  I provided over 30 minutes of exam, counseling, chart review and  complex critical decision making.   Kirtland Bouchard, MD

## 2020-05-05 ENCOUNTER — Ambulatory Visit (INDEPENDENT_AMBULATORY_CARE_PROVIDER_SITE_OTHER): Payer: Self-pay | Admitting: Internal Medicine

## 2020-05-05 ENCOUNTER — Other Ambulatory Visit: Payer: Self-pay

## 2020-05-05 VITALS — BP 120/84 | HR 108 | Temp 96.5°F | Resp 16 | Ht 75.0 in | Wt 244.2 lb

## 2020-05-05 DIAGNOSIS — I10 Essential (primary) hypertension: Secondary | ICD-10-CM

## 2020-05-05 DIAGNOSIS — E782 Mixed hyperlipidemia: Secondary | ICD-10-CM

## 2020-05-05 DIAGNOSIS — R7309 Other abnormal glucose: Secondary | ICD-10-CM

## 2020-05-05 DIAGNOSIS — F320 Major depressive disorder, single episode, mild: Secondary | ICD-10-CM

## 2020-05-05 DIAGNOSIS — F419 Anxiety disorder, unspecified: Secondary | ICD-10-CM

## 2020-05-05 DIAGNOSIS — Z79899 Other long term (current) drug therapy: Secondary | ICD-10-CM

## 2020-05-05 DIAGNOSIS — E559 Vitamin D deficiency, unspecified: Secondary | ICD-10-CM

## 2020-05-05 DIAGNOSIS — K55069 Acute infarction of intestine, part and extent unspecified: Secondary | ICD-10-CM

## 2020-05-05 DIAGNOSIS — D6859 Other primary thrombophilia: Secondary | ICD-10-CM

## 2020-05-05 DIAGNOSIS — E039 Hypothyroidism, unspecified: Secondary | ICD-10-CM

## 2020-05-05 DIAGNOSIS — K8301 Primary sclerosing cholangitis: Secondary | ICD-10-CM

## 2020-05-05 NOTE — Patient Instructions (Signed)
Due to recent changes in healthcare laws, you may see the results of your imaging and laboratory studies on MyChart before your provider has had a chance to review them.  We understand that in some cases there may be results that are confusing or concerning to you. Not all laboratory results come back in the same time frame and the provider may be waiting for multiple results in order to interpret others.  Please give [place] 48 hours in order for your provider to thoroughly review all the results before contacting the office for clarification of your results.    +++++++++++++++++++++++++++++  Major Depressive Disorder  Major depressive disorder (MDD) is a mental health condition. MDD often makes you feel sad, hopeless, or helpless. MDD can also cause symptoms in your body. MDD can affect your:  Work.  School.  Relationships.  Other normal activities. MDD can range from mild to very bad. It may occur once (single episode MDD). It can also occur many times (recurrent MDD). The main symptoms of MDD often include:  Feeling sad, depressed, or irritable most of the time.  Loss of interest. MDD symptoms also include:  Sleeping too much or too little.  Eating too much or too little.  A change in your weight.  Feeling tired (fatigue) or having low energy.  Feeling worthless.  Feeling guilty.  Trouble making decisions.  Trouble thinking clearly.  Thoughts of suicide or harming others.  Feeling weak.  Feeling agitated.  Keeping yourself from being around other people (isolation). Follow these instructions at home: Activity  Do these things as told by your doctor: ? Go back to your normal activities. ? Exercise regularly. ? Spend time outdoors. Alcohol  Talk with your doctor about how alcohol can affect your antidepressant medicines.  Do not drink alcohol. Or, limit how much alcohol you drink. ? This means no more than 1 drink a day for nonpregnant women and 2 drinks a day  for men. One drink equals one of these:  12 oz of beer.  5 oz of wine.  1 oz of hard liquor. General instructions  Take over-the-counter and prescription medicines only as told by your doctor.  Eat a healthy diet.  Get plenty of sleep.  Find activities that you enjoy. Make time to do them.  Think about joining a support group. Your doctor may be able to suggest a group for you.  Keep all follow-up visits as told by your doctor. This is important. Where to find more information:  [employer] on Mental Illness: ? www.nami.[place]: ? https://carter.com/  National Suicide Prevention Lifeline: ? 385-858-2620. This is free, 24-hour help. Contact a doctor if:  Your symptoms get worse.  You have new symptoms. Get help right away if:  You self-harm.  You see, hear, taste, smell, or feel things that are not present (hallucinate). If you ever feel like you may hurt yourself or others, or have thoughts about taking your own life, get help right away. You can go to your nearest emergency department or call:  Your local emergency services (911 in the [place]  A suicide crisis helpline, such as the National Suicide Prevention Lifeline: ? 910-085-9264. This is open 24 hours a day. This information is not intended to replace advice given to you by your health care provider. Make sure you discuss any questions you have with your health care provider. Document Revised: 09/14/2017 Document Reviewed: 06/18/2016 Elsevier Patient Education  Opp.  ++++++++++++++++++++++++++++++++++  Generalized Anxiety Disorder, Adult Generalized anxiety disorder (GAD) is a mental health disorder. People with this condition constantly worry about everyday events. Unlike normal anxiety, worry related to GAD is not triggered by a specific event. These worries also do not fade or get better with time. GAD interferes with life functions, including  relationships, work, and school. GAD can vary from mild to severe. People with severe GAD can have intense waves of anxiety with physical symptoms (panic attacks). What are the causes? The exact cause of GAD is not known. What increases the risk? This condition is more likely to develop in:  Women.  People who have a family history of anxiety disorders.  People who are very shy.  People who experience very stressful life events, such as the death of a loved one.  People who have a very stressful family environment. What are the signs or symptoms? People with GAD often worry excessively about many things in their lives, such as their health and family. They may also be overly concerned about:  Doing well at work.  Being on time.  Natural disasters.  Friendships. Physical symptoms of GAD include:  Fatigue.  Muscle tension or having muscle twitches.  Trembling or feeling shaky.  Being easily startled.  Feeling like your heart is pounding or racing.  Feeling out of breath or like you cannot take a deep breath.  Having trouble falling asleep or staying asleep.  Sweating.  Nausea, diarrhea, or irritable bowel syndrome (IBS).  Headaches.  Trouble concentrating or remembering facts.  Restlessness.  Irritability. How is this diagnosed? Your health care provider can diagnose GAD based on your symptoms and medical history. You will also have a physical exam. The health care provider will ask specific questions about your symptoms, including how severe they are, when they started, and if they come and go. Your health care provider may ask you about your use of alcohol or drugs, including prescription medicines. Your health care provider may refer you to a mental health specialist for further evaluation. Your health care provider will do a thorough examination and may perform additional tests to rule out other possible causes of your symptoms. To be diagnosed with GAD, a  person must have anxiety that:  Is out of his or her control.  Affects several different aspects of his or her life, such as work and relationships.  Causes distress that makes him or her unable to take part in normal activities.  Includes at least three physical symptoms of GAD, such as restlessness, fatigue, trouble concentrating, irritability, muscle tension, or sleep problems. Before your health care provider can confirm a diagnosis of GAD, these symptoms must be present more days than they are not, and they must last for six months or longer. How is this treated? The following therapies are usually used to treat GAD:  Medicine. Antidepressant medicine is usually prescribed for long-term daily control. Antianxiety medicines may be added in severe cases, especially when panic attacks occur.  Talk therapy (psychotherapy). Certain types of talk therapy can be helpful in treating GAD by providing support, education, and guidance. Options include: ? Cognitive behavioral therapy (CBT). People learn coping skills and techniques to ease their anxiety. They learn to identify unrealistic or negative thoughts and behaviors and to replace them with positive ones. ? Acceptance and commitment therapy (ACT). This treatment teaches people how to be mindful as a way to cope with unwanted thoughts and feelings. ? Biofeedback. This process trains you to manage your body's  response (physiological response) through breathing techniques and relaxation methods. You will work with a therapist while machines are used to monitor your physical symptoms.  Stress management techniques. These include yoga, meditation, and exercise. A mental health specialist can help determine which treatment is best for you. Some people see improvement with one type of therapy. However, other people require a combination of therapies. Follow these instructions at home:  Take over-the-counter and prescription medicines only as told by  your health care provider.  Try to maintain a normal routine.  Try to anticipate stressful situations and allow extra time to manage them.  Practice any stress management or self-calming techniques as taught by your health care provider.  Do not punish yourself for setbacks or for not making progress.  Try to recognize your accomplishments, even if they are small.  Keep all follow-up visits as told by your health care provider. This is important. Contact a health care provider if:  Your symptoms do not get better.  Your symptoms get worse.  You have signs of depression, such as: ? A persistently sad, cranky, or irritable mood. ? Loss of enjoyment in activities that used to bring you joy. ? Change in weight or eating. ? Changes in sleeping habits. ? Avoiding friends or family members. ? Loss of energy for normal tasks. ? Feelings of guilt or worthlessness. Get help right away if:  You have serious thoughts about hurting yourself or others. If you ever feel like you may hurt yourself or others, or have thoughts about taking your own life, get help right away. You can go to your nearest emergency department or call:  Your local emergency services (911 in the [place]  A suicide crisis helpline, such as the [place] at 514-115-1266. This is open 24 hours a day. Summary  Generalized anxiety disorder (GAD) is a mental health disorder that involves worry that is not triggered by a specific event.  People with GAD often worry excessively about many things in their lives, such as their health and family.  GAD may cause physical symptoms such as restlessness, trouble concentrating, sleep problems, frequent sweating, nausea, diarrhea, headaches, and trembling or muscle twitching.  A mental health specialist can help determine which treatment is best for you. Some people see improvement with one type of therapy. However, other people require a combination of  therapies.     ++++++++++++++++++++++++++++++++++  Vit D  & Vit C 1,000 mg   are recommended to help protect  against the Covid-19 and other Corona viruses.    Also it's recommended  to take  Zinc 50 mg  to help  protect against the Covid-19   and best place to get  is also on [employer].com  and don't pay more than 6-8 cents /pill !   ===================================== Coronavirus (COVID-19) Are you at risk?  Are you at risk for the Coronavirus (COVID-19)?  To be considered HIGH RISK for Coronavirus (COVID-19), you have to meet the following criteria:  . Traveled to [place], [place], [place], [place] or [place]; or in the [place] to [place], [place], [place]  . or [place]; and have fever, cough, and shortness of breath within the last 2 weeks of travel OR . Been in close contact with a person diagnosed with COVID-19 within the last 2 weeks and have  . fever, cough,and shortness of breath .  . IF YOU DO NOT MEET THESE CRITERIA, YOU ARE CONSIDERED LOW RISK FOR COVID-19.  What  to do if you are HIGH RISK for COVID-19?  Marland Kitchen If you are having a medical emergency, call 911. . Seek medical care right away. Before you go to a doctor's office, urgent care or emergency department, .  call ahead and tell them about your recent travel, contact with someone diagnosed with COVID-19  .  and your symptoms.  . You should receive instructions from your physician's office regarding next steps of care.  . When you arrive at healthcare provider, tell the healthcare staff immediately you have returned from  . visiting [place], [place], [place], [place] or [place]; or traveled in the [place] to [place], [place],  . [place] or [place] in the last two weeks or you have been in close contact with a person diagnosed with  . COVID-19 in the last 2 weeks.   . Tell the health care staff about your symptoms: fever, cough and shortness of breath. . After you have been seen by a  medical provider, you will be either: o Tested for (COVID-19) and discharged home on quarantine except to seek medical care if  o symptoms worsen, and asked to  - Stay home and avoid contact with others until you get your results (4-5 days)  - Avoid travel on public transportation if possible (such as bus, train, or airplane) or o Sent to the Emergency Department by EMS for evaluation, COVID-19 testing  and  o possible admission depending on your condition and test results.  What to do if you are LOW RISK for COVID-19?  Reduce your risk of any infection by using the same precautions used for avoiding the common cold or flu:  Marland Kitchen Wash your hands often with soap and warm water for at least 20 seconds.  If soap and water are not readily available,  . use an alcohol-based hand sanitizer with at least 60% alcohol.  . If coughing or sneezing, cover your mouth and nose by coughing or sneezing into the elbow areas of your shirt or coat, .  into a tissue or into your sleeve (not your hands). . Avoid shaking hands with others and consider head nods or verbal greetings only. . Avoid touching your eyes, nose, or mouth with unwashed hands.  . Avoid close contact with people who are sick. . Avoid places or events with large numbers of people in one location, like concerts or sporting events. . Carefully consider travel plans you have or are making. . If you are planning any travel outside or inside the [place], visit the CDC's Travelers' Health webpage for the latest health notices. . If you have some symptoms but not all symptoms, continue to monitor at home and seek medical attention  . if your symptoms worsen. . If you are having a medical emergency, call 911.   ++++++++++++++++++++++++++++++++ Recommend Adult Low Dose Aspirin or  coated  Aspirin 81 mg daily  To reduce risk of Colon Cancer 40 %,  Skin Cancer 26 % ,  Melanoma 46%  and  Pancreatic cancer 60% ++++++++++++++++++++++++++++++++ Vitamin D  goal  is between 70-100.  Please make sure that you are taking your Vitamin D as directed.  It is very important as a natural anti-inflammatory  helping hair, skin, and nails, as well as reducing stroke and heart attack risk.  It helps your bones and helps with mood. It also decreases numerous cancer risks so please take it as directed.  Low Vit D is associated with a 200-300% higher risk for  CANCER  and 200-300% higher risk for HEART   ATTACK  &  STROKE.   .....................................Marland Kitchen It is also associated with higher death rate at younger ages,  autoimmune diseases like Rheumatoid arthritis, Lupus, Multiple Sclerosis.    Also many other serious conditions, like depression, Alzheimer's Dementia, infertility, muscle aches, fatigue, fibromyalgia - just to name a few. ++++++++++++++++++++ Recommend the book "The END of DIETING" by Dr Excell Seltzer  & the book "The END of DIABETES " by Dr Excell Seltzer At [email or web address] - get book & Audio CD's    Being diabetic has a  300% increased risk for heart attack, stroke, cancer, and alzheimer- type vascular dementia. It is very important that you work harder with diet by avoiding all foods that are white. Avoid white rice (brown & wild rice is OK), white potatoes (sweetpotatoes in moderation is OK), White bread or wheat bread or anything made out of white flour like bagels, donuts, rolls, buns, biscuits, cakes, pastries, cookies, pizza crust, and pasta (made from white flour & egg whites) - vegetarian pasta or spinach or wheat pasta is OK. Multigrain breads like Arnold's or [employer], or multigrain sandwich thins or flatbreads.  Diet, exercise and weight loss can reverse and cure diabetes in the early stages.  Diet, exercise and weight loss is very important in the control and prevention of complications of diabetes which affects every system in your body, ie. Brain - dementia/stroke, eyes - glaucoma/blindness, heart - heart attack/heart failure,  kidneys - dialysis, stomach - gastric paralysis, intestines - malabsorption, nerves - severe painful neuritis, circulation - gangrene & loss of a leg(s), and finally cancer and Alzheimers.    I recommend avoid fried & greasy foods,  sweets/candy, white rice (brown or wild rice or Quinoa is OK), white potatoes (sweet potatoes are OK) - anything made from white flour - bagels, doughnuts, rolls, buns, biscuits,white and wheat breads, pizza crust and traditional pasta made of white flour & egg white(vegetarian pasta or spinach or wheat pasta is OK).  Multi-grain bread is OK - like multi-grain flat bread or sandwich thins. Avoid alcohol in excess. Exercise is also important.    Eat all the vegetables you want - avoid meat, especially red meat and dairy - especially cheese.  Cheese is the most concentrated form of trans-fats which is the worst thing to clog up our arteries. Veggie cheese is OK which can be found in the fresh produce section at [employer] or [employer] or [employer]  +++++++++++++++++++++ DASH Eating Plan  DASH stands for "Dietary Approaches to Stop Hypertension."   The DASH eating plan is a healthy eating plan that has been shown to reduce high blood pressure (hypertension). Additional health benefits may include reducing the risk of type 2 diabetes mellitus, heart disease, and stroke. The DASH eating plan may also help with weight loss. WHAT DO I NEED TO KNOW ABOUT THE DASH EATING PLAN? For the DASH eating plan, you will follow these general guidelines:  Choose foods with a percent daily value for sodium of less than 5% (as listed on the food label).  Use salt-free seasonings or herbs instead of table salt or sea salt.  Check with your health care provider or pharmacist before using salt substitutes.  Eat lower-sodium products, often labeled as "lower sodium" or "no salt added."  Eat fresh foods.  Eat more vegetables, fruits, and low-fat dairy products.  Choose whole grains.  Look for the word "whole" as the first word in the ingredient  list.  Choose fish   Limit sweets, desserts, sugars, and sugary drinks.  Choose heart-healthy fats.  Eat veggie cheese   Eat more home-cooked food and less restaurant, buffet, and fast food.  Limit fried foods.  Cook foods using methods other than frying.  Limit canned vegetables. If you do use them, rinse them well to decrease the sodium.  When eating at a restaurant, ask that your food be prepared with less salt, or no salt if possible.                      WHAT FOODS CAN I EAT? Read Dr Fara Olden Fuhrman's books on The End of Dieting & The End of Diabetes  Grains Whole grain or whole wheat bread. Brown rice. Whole grain or whole wheat pasta. Quinoa, bulgur, and whole grain cereals. Low-sodium cereals. Corn or whole wheat flour tortillas. Whole grain cornbread. Whole grain crackers. Low-sodium crackers.  Vegetables Fresh or frozen vegetables (raw, steamed, roasted, or grilled). Low-sodium or reduced-sodium tomato and vegetable juices. Low-sodium or reduced-sodium tomato sauce and paste. Low-sodium or reduced-sodium canned vegetables.   Fruits All fresh, canned (in natural juice), or frozen fruits.  Protein Products  All fish and seafood.  Dried beans, peas, or lentils. Unsalted nuts and seeds. Unsalted canned beans.  Dairy Low-fat dairy products, such as skim or 1% milk, 2% or reduced-fat cheeses, low-fat ricotta or cottage cheese, or plain low-fat yogurt. Low-sodium or reduced-sodium cheeses.  Fats and Oils Tub margarines without trans fats. Light or reduced-fat mayonnaise and salad dressings (reduced sodium). Avocado. Safflower, olive, or canola oils. Natural peanut or almond butter.  Other Unsalted popcorn and pretzels. The items listed above may not be a complete list of recommended foods or beverages. Contact your dietitian for more options.  +++++++++++++++  WHAT FOODS ARE NOT RECOMMENDED? Grains/ White  flour or wheat flour White bread. White pasta. White rice. Refined cornbread. Bagels and croissants. Crackers that contain trans fat.  Vegetables  Creamed or fried vegetables. Vegetables in a . Regular canned vegetables. Regular canned tomato sauce and paste. Regular tomato and vegetable juices.  Fruits Dried fruits. Canned fruit in light or heavy syrup. Fruit juice.  Meat and Other Protein Products Meat in general - RED meat & White meat.  Fatty cuts of meat. Ribs, chicken wings, all processed meats as bacon, sausage, bologna, salami, fatback, hot dogs, bratwurst and packaged luncheon meats.  Dairy Whole or 2% milk, cream, half-and-half, and cream cheese. Whole-fat or sweetened yogurt. Full-fat cheeses or blue cheese. Non-dairy creamers and whipped toppings. Processed cheese, cheese spreads, or cheese curds.  Condiments Onion and garlic salt, seasoned salt, table salt, and sea salt. Canned and packaged gravies. Worcestershire sauce. Tartar sauce. Barbecue sauce. Teriyaki sauce. Soy sauce, including reduced sodium. Steak sauce. Fish sauce. Oyster sauce. Cocktail sauce. Horseradish. Ketchup and mustard. Meat flavorings and tenderizers. Bouillon cubes. Hot sauce. Tabasco sauce. Marinades. Taco seasonings. Relishes.  Fats and Oils Butter, stick margarine, lard, shortening and bacon fat. Coconut, palm kernel, or palm oils. Regular salad dressings.  Pickles and olives. Salted popcorn and pretzels.  The items listed above may not be a complete list of foods and beverages to avoid.

## 2020-05-06 LAB — CBC WITH DIFFERENTIAL/PLATELET
Absolute Monocytes: 372 cells/uL (ref 200–950)
Basophils Absolute: 19 cells/uL (ref 0–200)
Basophils Relative: 0.3 %
Eosinophils Absolute: 13 cells/uL — ABNORMAL LOW (ref 15–500)
Eosinophils Relative: 0.2 %
HCT: 44.5 % (ref 38.5–50.0)
Hemoglobin: 14.9 g/dL (ref 13.2–17.1)
Lymphs Abs: 907 cells/uL (ref 850–3900)
MCH: 30.7 pg (ref 27.0–33.0)
MCHC: 33.5 g/dL (ref 32.0–36.0)
MCV: 91.6 fL (ref 80.0–100.0)
MPV: 11.3 fL (ref 7.5–12.5)
Monocytes Relative: 5.9 %
Neutro Abs: 4990 cells/uL (ref 1500–7800)
Neutrophils Relative %: 79.2 %
Platelets: 135 10*3/uL — ABNORMAL LOW (ref 140–400)
RBC: 4.86 10*6/uL (ref 4.20–5.80)
RDW: 13.1 % (ref 11.0–15.0)
Total Lymphocyte: 14.4 %
WBC: 6.3 10*3/uL (ref 3.8–10.8)

## 2020-05-06 LAB — COMPLETE METABOLIC PANEL WITH GFR
AG Ratio: 1.8 (calc) (ref 1.0–2.5)
ALT: 19 U/L (ref 9–46)
AST: 20 U/L (ref 10–40)
Albumin: 5.3 g/dL — ABNORMAL HIGH (ref 3.6–5.1)
Alkaline phosphatase (APISO): 52 U/L (ref 36–130)
BUN: 13 mg/dL (ref 7–25)
CO2: 25 mmol/L (ref 20–32)
Calcium: 10 mg/dL (ref 8.6–10.3)
Chloride: 106 mmol/L (ref 98–110)
Creat: 1.14 mg/dL (ref 0.60–1.35)
GFR, Est African American: 102 mL/min/{1.73_m2} (ref >=60)
GFR, Est Non African American: 88 mL/min/{1.73_m2} (ref >=60)
Globulin: 3 g/dL (calc) (ref 1.9–3.7)
Glucose, Bld: 97 mg/dL (ref 65–99)
Potassium: 4 mmol/L (ref 3.5–5.3)
Sodium: 140 mmol/L (ref 135–146)
Total Bilirubin: 0.9 mg/dL (ref 0.2–1.2)
Total Protein: 8.3 g/dL — ABNORMAL HIGH (ref 6.1–8.1)

## 2020-05-06 LAB — HEMOGLOBIN A1C
Hgb A1c MFr Bld: 5.7 % of total Hgb — ABNORMAL HIGH (ref <5.7)
Mean Plasma Glucose: 117 (calc)
eAG (mmol/L): 6.5 (calc)

## 2020-05-06 LAB — LIPID PANEL
Cholesterol: 234 mg/dL — ABNORMAL HIGH (ref <200)
HDL: 45 mg/dL (ref >=40)
LDL Cholesterol (Calc): 168 mg/dL (calc) — ABNORMAL HIGH
Non-HDL Cholesterol (Calc): 189 mg/dL (calc) — ABNORMAL HIGH (ref <130)
Total CHOL/HDL Ratio: 5.2 (calc) — ABNORMAL HIGH (ref <5.0)
Triglycerides: 101 mg/dL (ref <150)

## 2020-05-06 LAB — VITAMIN D 25 HYDROXY (VIT D DEFICIENCY, FRACTURES): Vit D, 25-Hydroxy: 32 ng/mL (ref 30–100)

## 2020-05-06 LAB — TSH: TSH: 9.45 mIU/L — ABNORMAL HIGH (ref 0.40–4.50)

## 2020-05-06 LAB — MAGNESIUM: Magnesium: 2.1 mg/dL (ref 1.5–2.5)

## 2020-05-06 NOTE — Progress Notes (Signed)
==========================================================  -  Chol = 234 -Very high  and LDL Chol =168 - also very high risk for hear attack, Stroke or Kidney Damage   - Recommend a much stricter low cholesterol diet   - Cholesterol only comes from animal sources  - ie. meat, dairy, egg yolks  - Eat all the vegetables you want.  - Avoid meat, especially red meat - Beef AND Pork .  - Avoid cheese & dairy - milk & ice cream.     - Cheese is the most concentrated form of trans-fats which  is the worst thing to clog up our arteries.   - Veggie cheese is OK which can be found in the fresh  produce section at [employer] or [employer] or [employer]  ------------------------------------------------------------------------------------------------------  - Recommend call office to schedule a 3 month follow-up office visit to recheck Cholesterol & if not significantly improved, then will need to start meds to lower Cholesterol and reduce risks. ==========================================================  -  Elevated TSH means  that Thyroid hormone level is low in blood and suggests that you are not taking it regularly or taking it properly   -  It is very important that you take your thyroid medicine on an empty stomach with only water for 30 minutes & no Antacid meds, Calcium or Magnesium for 4 hours & avoid Biotin ==========================================================  -  Your A1c = 5.7% Blood sugar is in the borderline and  early or pre-diabetes range which has the same   300% increased risk for heart attack, stroke, cancer and   alzheimer- type vascular dementia as full blown diabetes.   But the good news is that diet, exercise with  weight loss can cure the early diabetes at this point.  It is very important that you work harder with diet by  avoiding all foods that are white except chicken,   fish & calliflower.  - Avoid white rice  (brown & wild rice is OK),   -  Avoid white potatoes  (sweet potatoes in moderation is OK),   White bread or wheat bread or anything made out of   white flour like bagels, donuts, rolls, buns, biscuits, cakes,  - pastries, cookies, pizza crust, and pasta (made from  white flour & egg whites)   - vegetarian pasta or spinach or wheat pasta is OK.  - Multigrain breads like Arnold's, Pepperidge Farm or   multigrain sandwich thins or high fiber breads like   [place] bread or "Dave's Killer" breads that are  4 to 5 grams fiber per slice !  are best.    Diet, exercise and weight loss can reverse and cure  diabetes in the early stages.   ==========================================================  -  Vitamin D = 32 is very low & shows that you are not taking Vitamin D as recommended   - Vitamin D goal is between 70-100.   - Please take  Vitamin D  5,000 unit capsule Daily   - It is very important as a natural anti-inflammatory and helping the  immune system protect against viral infections, like the Covid-19    helping hair, skin, and nails, as well as reducing stroke and heart attack risk.   - It helps your bones and helps with mood.  - It also decreases numerous cancer risks so please take it as directed.   - Low Vit D is associated with a 200-300% higher risk for CANCER   and 200-300% higher risk for HEART   ATTACK  &  STROKE.    - It is also associated with higher death rate at younger ages,   autoimmune diseases like Rheumatoid arthritis, Lupus, Multiple Sclerosis.     - Also many other serious conditions, like depression, Alzheimer's  Dementia, infertility, muscle aches, fatigue, fibromyalgia - just to name a few ==========================================================  -  All Else - CBC - Kidneys - Electrolytes - Liver  & Magnesium   - all  Normal / OK ====================================================

## 2020-05-09 ENCOUNTER — Encounter: Payer: Self-pay | Admitting: Internal Medicine

## 2020-05-10 ENCOUNTER — Encounter: Payer: Self-pay | Admitting: Internal Medicine

## 2020-05-10 ENCOUNTER — Inpatient Hospital Stay (HOSPITAL_BASED_OUTPATIENT_CLINIC_OR_DEPARTMENT_OTHER)
Admission: EM | Admit: 2020-05-10 | Discharge: 2020-05-18 | DRG: 339 | Disposition: A | Discharge status: Home / Self Care | Payer: Self-pay | Attending: Internal Medicine | Admitting: Internal Medicine

## 2020-05-10 ENCOUNTER — Emergency Department (HOSPITAL_COMMUNITY)
Admission: EM | Admit: 2020-05-10 | Discharge: 2020-05-10 | Disposition: A | Discharge status: Home / Self Care | Payer: Self-pay | Attending: Emergency Medicine | Admitting: Emergency Medicine

## 2020-05-10 ENCOUNTER — Encounter (HOSPITAL_COMMUNITY): Payer: Self-pay

## 2020-05-10 ENCOUNTER — Encounter (HOSPITAL_BASED_OUTPATIENT_CLINIC_OR_DEPARTMENT_OTHER): Payer: Self-pay | Admitting: Emergency Medicine

## 2020-05-10 ENCOUNTER — Other Ambulatory Visit: Payer: Self-pay

## 2020-05-10 ENCOUNTER — Emergency Department (HOSPITAL_BASED_OUTPATIENT_CLINIC_OR_DEPARTMENT_OTHER): Payer: Self-pay

## 2020-05-10 DIAGNOSIS — D62 Acute posthemorrhagic anemia: Secondary | ICD-10-CM | POA: Diagnosis not present

## 2020-05-10 DIAGNOSIS — K3533 Acute appendicitis with perforation and localized peritonitis, with abscess: Principal | ICD-10-CM | POA: Diagnosis present

## 2020-05-10 DIAGNOSIS — R945 Abnormal results of liver function studies: Secondary | ICD-10-CM | POA: Diagnosis present

## 2020-05-10 DIAGNOSIS — F1210 Cannabis abuse, uncomplicated: Secondary | ICD-10-CM | POA: Insufficient documentation

## 2020-05-10 DIAGNOSIS — F17200 Nicotine dependence, unspecified, uncomplicated: Secondary | ICD-10-CM | POA: Diagnosis present

## 2020-05-10 DIAGNOSIS — F1290 Cannabis use, unspecified, uncomplicated: Secondary | ICD-10-CM | POA: Insufficient documentation

## 2020-05-10 DIAGNOSIS — K3580 Unspecified acute appendicitis: Secondary | ICD-10-CM

## 2020-05-10 DIAGNOSIS — Z20822 Contact with and (suspected) exposure to covid-19: Secondary | ICD-10-CM | POA: Diagnosis present

## 2020-05-10 DIAGNOSIS — K920 Hematemesis: Secondary | ICD-10-CM | POA: Diagnosis not present

## 2020-05-10 DIAGNOSIS — D6859 Other primary thrombophilia: Secondary | ICD-10-CM | POA: Diagnosis present

## 2020-05-10 DIAGNOSIS — K353 Acute appendicitis with localized peritonitis, without perforation or gangrene: Secondary | ICD-10-CM

## 2020-05-10 DIAGNOSIS — F419 Anxiety disorder, unspecified: Secondary | ICD-10-CM | POA: Diagnosis present

## 2020-05-10 DIAGNOSIS — K36 Other appendicitis: Secondary | ICD-10-CM | POA: Diagnosis present

## 2020-05-10 DIAGNOSIS — Z5321 Procedure and treatment not carried out due to patient leaving prior to being seen by health care provider: Secondary | ICD-10-CM | POA: Insufficient documentation

## 2020-05-10 DIAGNOSIS — Z79890 Hormone replacement therapy (postmenopausal): Secondary | ICD-10-CM

## 2020-05-10 DIAGNOSIS — E782 Mixed hyperlipidemia: Secondary | ICD-10-CM | POA: Diagnosis present

## 2020-05-10 DIAGNOSIS — R112 Nausea with vomiting, unspecified: Secondary | ICD-10-CM | POA: Insufficient documentation

## 2020-05-10 DIAGNOSIS — E0500 Thyrotoxicosis with diffuse goiter without thyrotoxic crisis or storm: Secondary | ICD-10-CM | POA: Diagnosis present

## 2020-05-10 DIAGNOSIS — N179 Acute kidney failure, unspecified: Secondary | ICD-10-CM | POA: Diagnosis present

## 2020-05-10 DIAGNOSIS — Z9049 Acquired absence of other specified parts of digestive tract: Secondary | ICD-10-CM

## 2020-05-10 DIAGNOSIS — Z86718 Personal history of other venous thrombosis and embolism: Secondary | ICD-10-CM

## 2020-05-10 DIAGNOSIS — I10 Essential (primary) hypertension: Secondary | ICD-10-CM | POA: Diagnosis present

## 2020-05-10 DIAGNOSIS — R109 Unspecified abdominal pain: Secondary | ICD-10-CM | POA: Insufficient documentation

## 2020-05-10 DIAGNOSIS — R7989 Other specified abnormal findings of blood chemistry: Secondary | ICD-10-CM | POA: Diagnosis present

## 2020-05-10 DIAGNOSIS — Z6830 Body mass index (BMI) 30.0-30.9, adult: Secondary | ICD-10-CM

## 2020-05-10 DIAGNOSIS — I82409 Acute embolism and thrombosis of unspecified deep veins of unspecified lower extremity: Secondary | ICD-10-CM | POA: Diagnosis present

## 2020-05-10 DIAGNOSIS — E559 Vitamin D deficiency, unspecified: Secondary | ICD-10-CM | POA: Diagnosis present

## 2020-05-10 DIAGNOSIS — D6959 Other secondary thrombocytopenia: Secondary | ICD-10-CM | POA: Diagnosis present

## 2020-05-10 DIAGNOSIS — K567 Ileus, unspecified: Secondary | ICD-10-CM | POA: Diagnosis not present

## 2020-05-10 DIAGNOSIS — Z0189 Encounter for other specified special examinations: Secondary | ICD-10-CM

## 2020-05-10 DIAGNOSIS — K9189 Other postprocedural complications and disorders of digestive system: Secondary | ICD-10-CM | POA: Diagnosis not present

## 2020-05-10 DIAGNOSIS — K117 Disturbances of salivary secretion: Secondary | ICD-10-CM | POA: Diagnosis not present

## 2020-05-10 DIAGNOSIS — K219 Gastro-esophageal reflux disease without esophagitis: Secondary | ICD-10-CM | POA: Diagnosis present

## 2020-05-10 DIAGNOSIS — Z79899 Other long term (current) drug therapy: Secondary | ICD-10-CM

## 2020-05-10 DIAGNOSIS — E876 Hypokalemia: Secondary | ICD-10-CM | POA: Diagnosis present

## 2020-05-10 DIAGNOSIS — E039 Hypothyroidism, unspecified: Secondary | ICD-10-CM | POA: Diagnosis present

## 2020-05-10 DIAGNOSIS — E669 Obesity, unspecified: Secondary | ICD-10-CM | POA: Diagnosis present

## 2020-05-10 DIAGNOSIS — Z7901 Long term (current) use of anticoagulants: Secondary | ICD-10-CM

## 2020-05-10 DIAGNOSIS — E440 Moderate protein-calorie malnutrition: Secondary | ICD-10-CM | POA: Diagnosis present

## 2020-05-10 DIAGNOSIS — K8301 Primary sclerosing cholangitis: Secondary | ICD-10-CM | POA: Diagnosis present

## 2020-05-10 DIAGNOSIS — K37 Unspecified appendicitis: Secondary | ICD-10-CM | POA: Diagnosis present

## 2020-05-10 LAB — URINALYSIS, ROUTINE W REFLEX MICROSCOPIC
Bacteria, UA: NONE SEEN
Bilirubin Urine: NEGATIVE
Bilirubin Urine: NEGATIVE
Glucose, UA: NEGATIVE mg/dL
Glucose, UA: NEGATIVE mg/dL
Hgb urine dipstick: NEGATIVE
Ketones, ur: 20 mg/dL — AB
Ketones, ur: 40 mg/dL — AB
Leukocytes,Ua: NEGATIVE
Leukocytes,Ua: NEGATIVE
Nitrite: NEGATIVE
Nitrite: NEGATIVE
Protein, ur: 30 mg/dL — AB
Protein, ur: NEGATIVE mg/dL
Specific Gravity, Urine: 1.03 (ref 1.005–1.030)
Specific Gravity, Urine: 1.01 (ref 1.005–1.030)
pH: 6 (ref 5.0–8.0)
pH: 8 (ref 5.0–8.0)

## 2020-05-10 LAB — COMPREHENSIVE METABOLIC PANEL
ALT: 22 U/L (ref 0–44)
AST: 27 U/L (ref 15–41)
Albumin: 5.4 g/dL — ABNORMAL HIGH (ref 3.5–5.0)
Alkaline Phosphatase: 46 U/L (ref 38–126)
Anion gap: 15 (ref 5–15)
BUN: 13 mg/dL (ref 6–20)
CO2: 21 mmol/L — ABNORMAL LOW (ref 22–32)
Calcium: 9.7 mg/dL (ref 8.9–10.3)
Chloride: 101 mmol/L (ref 98–111)
Creatinine, Ser: 1.29 mg/dL — ABNORMAL HIGH (ref 0.61–1.24)
GFR calc Af Amer: >60 mL/min (ref >60)
GFR calc non Af Amer: >60 mL/min (ref >60)
Glucose, Bld: 158 mg/dL — ABNORMAL HIGH (ref 70–99)
Potassium: 3.1 mmol/L — ABNORMAL LOW (ref 3.5–5.1)
Sodium: 137 mmol/L (ref 135–145)
Total Bilirubin: 1 mg/dL (ref 0.3–1.2)
Total Protein: 8.7 g/dL — ABNORMAL HIGH (ref 6.5–8.1)

## 2020-05-10 LAB — CBC
HCT: 44.2 % (ref 39.0–52.0)
Hemoglobin: 14.7 g/dL (ref 13.0–17.0)
MCH: 30.4 pg (ref 26.0–34.0)
MCHC: 33.3 g/dL (ref 30.0–36.0)
MCV: 91.5 fL (ref 80.0–100.0)
Platelets: 204 10*3/uL (ref 150–400)
RBC: 4.83 MIL/uL (ref 4.22–5.81)
RDW: 13.2 % (ref 11.5–15.5)
WBC: 16.7 10*3/uL — ABNORMAL HIGH (ref 4.0–10.5)
nRBC: 0 % (ref 0.0–0.2)

## 2020-05-10 LAB — HEPARIN LEVEL (UNFRACTIONATED): Heparin Unfractionated: 0.46 IU/mL (ref 0.30–0.70)

## 2020-05-10 LAB — APTT: aPTT: 23 seconds — ABNORMAL LOW (ref 24–36)

## 2020-05-10 LAB — URINALYSIS, MICROSCOPIC (REFLEX): WBC, UA: NONE SEEN WBC/hpf (ref 0–5)

## 2020-05-10 LAB — SARS CORONAVIRUS 2 BY RT PCR (HOSPITAL ORDER, PERFORMED IN ~~LOC~~ HOSPITAL LAB): SARS Coronavirus 2: NEGATIVE

## 2020-05-10 LAB — LIPASE, BLOOD: Lipase: 26 U/L (ref 11–51)

## 2020-05-10 MED ORDER — MORPHINE SULFATE (PF) 4 MG/ML IV SOLN
4.0000 mg | Freq: Once | INTRAVENOUS | Status: AC
  Administered 2020-05-10: 4 mg via INTRAVENOUS
  Filled 2020-05-10: qty 1

## 2020-05-10 MED ORDER — ONDANSETRON 4 MG PO TBDP
4.0000 mg | ORAL_TABLET | Freq: Once | ORAL | Status: AC | PRN
  Administered 2020-05-10: 4 mg via ORAL
  Filled 2020-05-10: qty 1

## 2020-05-10 MED ORDER — ONDANSETRON HCL 4 MG PO TABS: 4.0000 mg | ORAL_TABLET | Freq: Four times a day (QID) | ORAL | Status: DC | PRN

## 2020-05-10 MED ORDER — HEPARIN SODIUM (PORCINE) 5000 UNIT/ML IJ SOLN: 5000.0000 [IU] | Freq: Three times a day (TID) | INTRAMUSCULAR | Status: DC

## 2020-05-10 MED ORDER — SODIUM CHLORIDE 0.9 % IV SOLN
2.0000 g | INTRAVENOUS | Status: DC
  Administered 2020-05-11: 2 g via INTRAVENOUS
  Filled 2020-05-10 (×2): qty 20

## 2020-05-10 MED ORDER — SODIUM CHLORIDE 0.9% FLUSH: 3.0000 mL | Freq: Once | INTRAVENOUS | Status: DC

## 2020-05-10 MED ORDER — ONDANSETRON HCL 4 MG/2ML IJ SOLN
4.0000 mg | Freq: Once | INTRAMUSCULAR | Status: AC
  Administered 2020-05-10: 4 mg via INTRAVENOUS
  Filled 2020-05-10: qty 2

## 2020-05-10 MED ORDER — FENTANYL CITRATE (PF) 100 MCG/2ML IJ SOLN
100.0000 ug | Freq: Once | INTRAMUSCULAR | Status: AC
  Administered 2020-05-10: 100 ug via INTRAVENOUS
  Filled 2020-05-10: qty 2

## 2020-05-10 MED ORDER — MORPHINE SULFATE (PF) 2 MG/ML IV SOLN
2.0000 mg | INTRAVENOUS | Status: DC | PRN
  Administered 2020-05-10: 21:00:00 2 mg via INTRAVENOUS
  Filled 2020-05-10: qty 1

## 2020-05-10 MED ORDER — OXYCODONE HCL 5 MG PO TABS
5.0000 mg | ORAL_TABLET | ORAL | Status: DC | PRN
  Administered 2020-05-10 – 2020-05-11 (×4): 10 mg via ORAL
  Filled 2020-05-10 (×5): qty 2

## 2020-05-10 MED ORDER — IOHEXOL 300 MG/ML  SOLN
100.0000 mL | Freq: Once | INTRAMUSCULAR | Status: AC | PRN
  Administered 2020-05-10: 100 mL via INTRAVENOUS

## 2020-05-10 MED ORDER — ONDANSETRON HCL 4 MG/2ML IJ SOLN: 4.0000 mg | Freq: Four times a day (QID) | INTRAMUSCULAR | Status: DC | PRN

## 2020-05-10 MED ORDER — METRONIDAZOLE IN NACL 5-0.79 MG/ML-% IV SOLN
500.0000 mg | Freq: Three times a day (TID) | INTRAVENOUS | Status: DC
  Administered 2020-05-10 – 2020-05-11 (×3): 500 mg via INTRAVENOUS
  Filled 2020-05-10 (×3): qty 100

## 2020-05-10 MED ORDER — GABAPENTIN 300 MG PO CAPS
300.0000 mg | ORAL_CAPSULE | ORAL | Status: AC
  Administered 2020-05-11: 300 mg via ORAL
  Filled 2020-05-10: qty 1

## 2020-05-10 MED ORDER — LACTATED RINGERS IV SOLN: INTRAVENOUS | Status: AC

## 2020-05-10 MED ORDER — LEVOTHYROXINE SODIUM 125 MCG PO TABS: 125.0000 ug | ORAL_TABLET | Freq: Every day | ORAL | Status: DC

## 2020-05-10 MED ORDER — SODIUM CHLORIDE 0.9 % IV SOLN
2.0000 g | Freq: Once | INTRAVENOUS | Status: AC
  Administered 2020-05-10: 2 g via INTRAVENOUS
  Filled 2020-05-10: qty 20

## 2020-05-10 MED ORDER — SCOPOLAMINE 1 MG/3DAYS TD PT72
1.0000 | MEDICATED_PATCH | TRANSDERMAL | Status: DC
  Administered 2020-05-11: 1.5 mg via TRANSDERMAL
  Filled 2020-05-10: qty 1

## 2020-05-10 MED ORDER — METOPROLOL TARTRATE 5 MG/5ML IV SOLN: 5.0000 mg | Freq: Four times a day (QID) | INTRAVENOUS | Status: DC | PRN

## 2020-05-10 MED ORDER — METRONIDAZOLE IN NACL 5-0.79 MG/ML-% IV SOLN
500.0000 mg | Freq: Once | INTRAVENOUS | Status: AC
  Administered 2020-05-10: 500 mg via INTRAVENOUS
  Filled 2020-05-10: qty 100

## 2020-05-10 MED ORDER — LACTATED RINGERS IV BOLUS
1000.0000 mL | Freq: Once | INTRAVENOUS | Status: AC
  Administered 2020-05-10: 1000 mL via INTRAVENOUS

## 2020-05-10 MED ORDER — HEPARIN (PORCINE) 25000 UT/250ML-% IV SOLN
1500.0000 [IU]/h | INTRAVENOUS | Status: DC
  Administered 2020-05-10: 1500 [IU]/h via INTRAVENOUS
  Filled 2020-05-10: qty 250

## 2020-05-10 MED ORDER — HEPARIN BOLUS VIA INFUSION
4000.0000 [IU] | Freq: Once | INTRAVENOUS | Status: AC
  Administered 2020-05-10: 4000 [IU] via INTRAVENOUS
  Filled 2020-05-10: qty 4000

## 2020-05-10 NOTE — ED Provider Notes (Signed)
[facility]ENC[facility] DEPARTMENT Provider Note   CSN: [id number] Arrival date & time: 05/10/20  4081     History Chief Complaint  Patient presents with  . Abdominal Pain    Kyle Gonzales is a 29 y.o. male.  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  Patient ate [place] food for dinner last night and approximately 1 hour later he began having abdominal pain followed by vomiting.  His abdominal pain is severe and was initially more in the left upper side but now he is having left flank pain.  He denies any associated diarrhea and reports normal bowel movements recently.  No urinary symptoms or fevers.  No sick contacts.  He does endorse frequent marijuana use but states that he has never had symptoms like this before.  He is compliant with medications.  The history is provided by the patient.  Abdominal Pain      Past Medical History:  Diagnosis Date  . GERD (gastroesophageal reflux disease)   . Graves disease 08/18/2015  . Hyperlipidemia   . Hypothyroid   . Palpitations   . PSC (primary sclerosing cholangitis)   . Thrombosis    superior mesenteric vein - 2019  . Vitamin D deficiency     Patient Active Problem List   Diagnosis Date Noted  . Protein S deficiency ([place]) 08/04/2019  . Abnormal glucose 08/04/2019  . DVT (deep venous thrombosis) ([place])   . 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 Hypertension 04/15/2018  . MDD (major depressive disorder), recurrent severe, without psychosis ([place]) 02/26/2018  . Non-compliance 01/14/2018  . Abnormal LFTs 04/08/2017  . Drug addiction / Marajuana ([place]) 01/06/2017  . Hyperlipidemia, mixed 01/11/2016  . Palpitations 12/04/2015  . Hypothyroidism 12/04/2015  . Vitamin D deficiency 08/11/2015    Past Surgical  History:  Procedure Laterality Date  . ANTERIOR CRUCIATE LIGAMENT REPAIR  2012  . ERCP N/A 08/19/2018   Procedure: ENDOSCOPIC RETROGRADE CHOLANGIOPANCREATOGRAPHY (ERCP);  Surgeon: Milus Banister, MD;  Location: [facility];  Service: Endoscopy;  Laterality: N/A;  . REMOVAL OF STONES  08/19/2018   Procedure: REMOVAL OF STONES;  Surgeon: Milus Banister, MD;  Location: [facility];  Service: Endoscopy;;  . Joan Mayans  08/19/2018   Procedure: Joan Mayans;  Surgeon: Milus Banister, MD;  Location: [facility];  Service: Endoscopy;;  . WISDOM TOOTH EXTRACTION         Family History  Problem Relation Age of Onset  . Diabetes Mother   . Colon cancer Maternal Grandmother   . Stomach cancer Maternal Grandmother   . Diabetes Maternal Grandmother   . Stomach cancer Maternal Grandfather   . Diabetes Maternal Grandfather     Social History   Tobacco Use  . Smoking status: Current Some Day Smoker  . Smokeless tobacco: Never Used  . Tobacco comment: Marijuana  Vaping Use  . Vaping Use: Former  Substance Use Topics  . Alcohol use: No  . Drug use: Yes    Types: Marijuana    Comment: Last  used yesterday    Home Medications Prior to Admission medications   Medication Sig Start Date End Date Taking? Authorizing Provider  alum & mag hydroxide-simeth (MAALOX ADVANCED MAX ST) 400-400-40 MG/5ML suspension Take 15 mLs by mouth every 6 (six) hours as needed for indigestion. 07/23/18   Cardama, Grayce Sessions, MD  apixaban (ELIQUIS) 5 MG  TABS tablet TAKE 1 TABLET(5 MG) BY MOUTH TWICE DAILY 01/13/19   [name], MD  bisoprolol-hydrochlorothiazide [facility]) 10-6.25 MG tablet Take 1 tablet Daily for BP 02/19/20   Liane Comber, NP  busPIRone (BUSPAR) 10 MG tablet Take 1 tablet 3 x /day for Anxiety 12/28/19   [name], MD  Cholecalciferol (VITAMIN D3) 5000 units CAPS Take 1 capsule (5,000 Units total) by mouth every other day. 05/02/18   Elgergawy, Silver Huguenin, MD  escitalopram  (LEXAPRO) 20 MG tablet Take 1 tablet Daily for Mood, Anxiety, Irritability  & Depression 07/19/19   [name], MD  ezetimibe (ZETIA) 10 MG tablet Take 1 tablet Daily for Cholesterol 12/28/19   [name], MD  levothyroxine (SYNTHROID) 100 MCG tablet Take 1 tablet daily on an empty stomach with only water for 30 minutes & no Antacid meds, Calcium or Magnesium for 4 hours & avoid Biotin 02/16/20   [name], MD  omeprazole (PRILOSEC) 40 MG capsule TAKE ONE CAPSULE BY MOUTH EVERY MORNING BEFORE BREAKFAST 02/19/20   Armbruster, Carlota Raspberry, MD  ondansetron (ZOFRAN) 8 MG tablet Take 1 tablet 3 x /day . . . ONLY . Marland Kitchen .  if needed for Nausea 03/17/20   [name], MD    Allergies    Tylenol [acetaminophen]  Review of Systems   Review of Systems  Gastrointestinal: Positive for abdominal pain.   All other systems reviewed and are negative except that which was mentioned in HPI  Physical Exam Updated Vital Signs BP (!) 129/59 (BP Location: Right Arm)   Pulse 62   Temp 98.2 F (36.8 C) (Oral)   Resp 16   Ht 6\' 3"  (1.905 m)   Wt (!) 110.3 kg   SpO2 100%   BMI 30.40 kg/m   Physical Exam Vitals and nursing note reviewed.  Constitutional:      General: He is in acute distress.     Appearance: He is well-developed.     Comments: Moaning, laying on side  HENT:     Head: Normocephalic and atraumatic.  Eyes:     Conjunctiva/sclera: Conjunctivae normal.  Cardiovascular:     Rate and Rhythm: Normal rate and regular rhythm.     Heart sounds: Normal heart sounds. No murmur heard.   Pulmonary:     Effort: Pulmonary effort is normal.     Breath sounds: Normal breath sounds.  Abdominal:     General: Bowel sounds are normal. There is no distension.     Palpations: Abdomen is soft.     Tenderness: There is abdominal tenderness in the left lower quadrant.  Musculoskeletal:     Cervical back: Neck supple.  Skin:    General: Skin is warm and dry.  Neurological:     Mental  Status: He is alert and oriented to person, place, and time.     Comments: Fluent speech  Psychiatric:        Judgment: Judgment normal.     Comments: distressed     ED Results / Procedures / Treatments   Labs (all labs ordered are listed, but only abnormal results are displayed) Labs Reviewed  URINALYSIS, ROUTINE W REFLEX MICROSCOPIC - Abnormal; Notable for the following components:      Result Value   Hgb urine dipstick MODERATE (*)    Ketones, ur 40 (*)    All other components within normal limits  URINALYSIS, MICROSCOPIC (REFLEX) - Abnormal; Notable for the following components:   Bacteria, UA FEW (*)    All  other components within normal limits  SARS CORONAVIRUS 2 BY RT PCR [facility] ORDER, [place] LAB)    EKG None  Radiology CT Abdomen Pelvis W Contrast  Result Date: 05/10/2020 CLINICAL DATA:  29 year old male with history of acute onset of left lower quadrant tenderness and left-sided flank pain. Leukocytosis. History of primary sclerosing cholangitis. EXAM: CT ABDOMEN AND PELVIS WITH CONTRAST TECHNIQUE: Multidetector CT imaging of the abdomen and pelvis was performed using the standard protocol following bolus administration of intravenous contrast. CONTRAST:  140mL OMNIPAQUE IOHEXOL 300 MG/ML  SOLN COMPARISON:  CT the abdomen and pelvis 07/05/2018. FINDINGS: Lower chest: Unremarkable. Hepatobiliary: Liver has a irregular contour, and there is moderate intrahepatic biliary ductal dilatation, increased slightly compared to prior studies, compatible with reported clinical history of primary sclerosing cholangitis. In segment 7 of the liver there is a 2.6 x 2.0 cm intermediate attenuation lesion (axial image 22 of series 2). In segment 5 of the liver there is a 1.5 x 1.4 cm intermediate attenuation lesion (axial image 30 of series 2). Both of these lesions were previously characterized as cavernous hemangiomas on prior abdominal MRI 08/16/2018. No other  suspicious hepatic lesions. Status post cholecystectomy. Cavernous transformation in the porta hepatis. Common bile duct is normal in caliber. Pancreas: No pancreatic mass. No pancreatic ductal dilatation. No pancreatic or peripancreatic fluid collections or inflammatory changes. Spleen: Unremarkable. Adrenals/Urinary Tract: Bilateral kidneys and adrenal glands are normal in appearance. No hydroureteronephrosis. Urinary bladder is normal in appearance. Stomach/Bowel: Normal appearance of the stomach. No pathologic dilatation of small bowel or colon. The appendix is dilated and inflamed, concerning for an acute appendicitis. Appendix: Location: Low anatomic pelvis Diameter: 2.0 cm Appendicolith: None Mucosal hyper-enhancement: Present Extraluminal gas: None Periappendiceal collection: None Vascular/Lymphatic: No significant atherosclerotic disease, aneurysm or dissection noted in the abdominal or pelvic vasculature. 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 No lymphadenopathy noted in the abdomen or pelvis. Reproductive: Prostate gland and seminal vesicles are unremarkable in appearance. Other: No significant volume of ascites.  No pneumoperitoneum. Musculoskeletal: There are no aggressive appearing lytic or blastic lesions noted in the visualized portions of the skeleton. IMPRESSION: 1. Findings are compatible with acute appendicitis. No periappendiceal abscess or signs of frank perforation are noted at this time. Surgical consultation is recommended. 2. Chronic changes related to primary sclerosing cholangitis redemonstrated with increased intrahepatic biliary ductal dilatation, as above. 3. 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 4. 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 Electronically Signed   By: Vinnie Langton M.D.   On: 05/10/2020 08:33     Procedures Procedures (including critical care time)  Medications Ordered in ED Medications  cefTRIAXone (ROCEPHIN) 2 g in sodium chloride 0.9 % 100 mL IVPB (has no administration in time range)    And  metroNIDAZOLE (FLAGYL) IVPB 500 mg (500 mg Intravenous New Bag/Given 05/10/20 0931)  lactated ringers bolus 1,000 mL ( Intravenous Stopped 05/10/20 0915)  ondansetron (ZOFRAN) injection 4 mg (4 mg Intravenous Given 05/10/20 0750)  fentaNYL (SUBLIMAZE) injection 100 mcg (100 mcg Intravenous Given 05/10/20 0751)  iohexol (OMNIPAQUE) 300 MG/ML solution 100 mL (100 mLs Intravenous Contrast Given 05/10/20 0753)    ED Course  I have reviewed the triage vital signs and the nursing notes.  Pertinent labs & imaging results that were available during my care of the patient were reviewed by me and considered  in my medical decision making (see chart for details).    MDM Rules/Calculators/A&P                          Patient was in distress on exam due to pain but with reassuring vital signs.  He had checked into [facility], ER earlier in the night and had lab work that showed potassium 3.1, glucose 158, BUN 13, creatinine 1.29, normal AST, ALT, and bilirubin, normal lipase, WBC 16.7, UA with ketones and small amount of protein but no blood or signs of infection.  I am concerned about the severity of his pain in the setting of leukocytosis and differential is broad and includes kidney stone, diverticulitis, bowel obstruction, or problem related to known clotting disorder and PSC. Obtained Ct abd/pelvis which shows acute unruptured appendicitis.  Gave the patient ceftriaxone and Flagyl.  His vital signs have remained normal here.  I discussed with general surgeon on-call at [facility], Dr. Hassell Done, who has agreed with antibiotics and recommends transfer to ED for evaluation and admission.  Discussed transfer with Dr. Sherry Ruffing. PT in agreement w/ plan.  Patient is certain he took Eliquis yesterday morning.  He cannot recall whether he took Eliquis yesterday evening; he has not had Eliquis today.  Final Clinical Impression(s) / ED Diagnoses Final diagnoses:  Acute appendicitis with localized peritonitis, without perforation, abscess, or gangrene    Rx / DC Orders ED Discharge Orders    None       Adamaris King, Wenda Overland, MD 05/10/20 [id number]

## 2020-05-10 NOTE — ED Notes (Addendum)
[facility] Transport Team at bedside 

## 2020-05-10 NOTE — ED Notes (Addendum)
Pt arrived [facility] from [facility] abd pain. Pt is on Eliquis but did not take it today.    20G LAC   Per [facility] labs weren't pushed WBC 16.7 Lipase normal  CT scan revealed acute appendicitis   Hx Graves disease, palpitations, " some kind of thrombus"

## 2020-05-10 NOTE — ED Provider Notes (Signed)
11:20 AM Patient has been transferred from [facility] for evaluation by the surgical team for acute appendicitis.  Dr. Hassell Done was the surgeon who accepted the patient.  We will call him to come to the patient for admission.  12:07 PM Spoke to general surgery.  They will come see patient and admit for further management of appendicitis.  1:14 PM Spoke to general surgery again and they are requesting patient be admitted to the medicine service for further management as he has a history of other medical problems and will need several days of washout of the blood thinners for surgery.  He has a history of Graves' disease, hypothyroidism, hypertension, protein S deficiency, primary sclerosing cholangitis, and SMV thrombosis which is why he is on the Eliquis.  I will call medicine service for admission with general surgery following to anticipate doing surgery in the next few days.   Clinical Impression: 1. Acute appendicitis with localized peritonitis, without perforation, abscess, or gangrene     Disposition: Admit  This note was prepared with assistance of Dragon voice recognition software. Occasional wrong-word or sound-a-like substitutions may have occurred due to the inherent limitations of voice recognition software.     Axelle Szwed, Gwenyth Allegra, MD 05/10/20 1400

## 2020-05-10 NOTE — Progress Notes (Addendum)
Kyle Gonzales for IV heparin Indication: protein S deficiency, SMV thrombus (bridging while off Eliquis)  Allergies  Allergen Reactions  . Tylenol [Acetaminophen] Other (See Comments)    r/t elevated LFTs     Patient Measurements: Height: 6\' 3"  (190.5 cm) Weight: (!) 110.3 kg (243 lb 3.2 oz) IBW/kg (Calculated) : 84.5 Heparin Dosing Weight: 107 kg  Vital Signs: Temp: 98.1 F (36.7 C) (07/26 1534) Temp Source: Oral (07/26 1534) BP: 140/70 (07/26 1534) Pulse Rate: 53 (07/26 1534)  Labs: Recent Labs    05/10/20 0149  HGB 14.7  HCT 44.2  PLT 204  CREATININE 1.29*    Estimated Creatinine Clearance: 115.3 mL/min (A) (by C-G formula based on SCr of 1.29 mg/dL (H)).   Medical History: Past Medical History:  Diagnosis Date  . GERD (gastroesophageal reflux disease)   . Graves disease 08/18/2015  . Hyperlipidemia   . Hypothyroid   . Palpitations   . PSC (primary sclerosing cholangitis)   . Thrombosis    superior mesenteric vein - 2019  . Vitamin D deficiency     Medications:  Medications Prior to Admission  Medication Sig Dispense Refill Last Dose  . alum & mag hydroxide-simeth (MAALOX ADVANCED MAX ST) 400-400-40 MG/5ML suspension Take 15 mLs by mouth every 6 (six) hours as needed for indigestion. 355 mL 0 Past Month at Unknown time  . apixaban (ELIQUIS) 5 MG TABS tablet TAKE 1 TABLET(5 MG) BY MOUTH TWICE DAILY (Patient taking differently: Take 5 mg by mouth 2 (two) times daily. ) 180 tablet 0 05/09/2020 at unk to mother   . bisoprolol-hydrochlorothiazide (ZIAC) 10-6.25 MG tablet Take 1 tablet Daily for BP (Patient taking differently: Take 1 tablet by mouth daily. ) 90 tablet 0 05/09/2020 at Unknown time  . busPIRone (BUSPAR) 10 MG tablet Take 1 tablet 3 x /day for Anxiety (Patient taking differently: Take 10 mg by mouth 3 (three) times daily. ) 270 tablet 1 05/09/2020 at Unknown time  . Cholecalciferol (VITAMIN D3) 5000 units CAPS Take 1  capsule (5,000 Units total) by mouth every other day. (Patient taking differently: Take 5,000 Units by mouth daily. )   05/09/2020 at Unknown time  . escitalopram (LEXAPRO) 20 MG tablet Take 1 tablet Daily for Mood, Anxiety, Irritability  & Depression (Patient taking differently: Take 20 mg by mouth daily. ) 30 tablet 0 05/09/2020 at Unknown time  . ezetimibe (ZETIA) 10 MG tablet Take 1 tablet Daily for Cholesterol (Patient taking differently: Take 10 mg by mouth daily. ) 90 tablet 0 05/09/2020 at Unknown time  . levothyroxine (SYNTHROID) 100 MCG tablet Take 1 tablet daily on an empty stomach with only water for 30 minutes & no Antacid meds, Calcium or Magnesium for 4 hours & avoid Biotin (Patient taking differently: Take 100 mcg by mouth daily before breakfast. ) 90 tablet 0 05/09/2020 at Unknown time  . omeprazole (PRILOSEC) 40 MG capsule TAKE ONE CAPSULE BY MOUTH EVERY MORNING BEFORE BREAKFAST (Patient taking differently: Take 40 mg by mouth daily. ) 90 capsule 1 05/09/2020 at Unknown time  . ondansetron (ZOFRAN) 8 MG tablet Take 1 tablet 3 x /day . . . ONLY . Marland Kitchen .  if needed for Nausea (Patient taking differently: Take 8 mg by mouth daily. ) 60 tablet 0 05/09/2020 at Unknown time   Scheduled:  . heparin  4,000 Units Intravenous Once   Infusions:  . [START ON 05/11/2020] cefTRIAXone (ROCEPHIN)  IV     And  . metronidazole    .  heparin    . lactated ringers 100 mL/hr at 05/10/20 1515   PRN: morphine injection, oxyCODONE  Assessment: 89 yoM with PMH of SMV thrombus and protein S deficiency on Eliquis PTA, admitted 7/26 for abdominal pain. Found to have acute appendicitis and Eliquis held to allow for appendectomy. Pharmacy to bridge with heparin perioperatively.    Baseline heparin level, aPTT: pending  Prior anticoagulation: Eliquis 5 mg PO bid, last dose 7/25 AM  Significant events:  Today, 05/10/2020:  CBC: WNL  No bleeding or infusion issues per nursing  Surgery requesting heparin stop  at 0400 tomorrow in anticipation of OR  Goal of Therapy: Heparin level 0.3-0.7 units/ml Monitor platelets by anticoagulation protocol: Yes  Plan:  Heparin 4000 units IV bolus x 1  Heparin 1500 units/hr IV infusion  Check heparin level 8 hrs after start  Stop heparin at 0400 tomorrow per Surgery instructions  Daily CBC, daily heparin level once stable  Monitor for signs of bleeding or thrombosis  Reuel Boom, PharmD, BCPS [phone] 05/10/2020, 4:39 PM

## 2020-05-10 NOTE — Consult Note (Signed)
Kyle Gonzales 1990-11-02  [id number].    Requesting MD: Dr. Sherry Ruffing Chief Complaint/Reason for Consult: Acute Appendicitis   HPI: Kyle Gonzales is a 29 y.o. 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  Patient reports yesterday night, approximate 1 hour after eating dinner he had the acute onset of generalized, achy abdominal pain.  He reports associated subjective fever, chills, nausea and emesis.  He denies interventions at home.  He notes that movement makes his symptoms worse.  He reports the symptoms feel similar to the abdominal pain he had prior to his gallbladder being removed.  He presented to the emergency room for evaluation.    He was noted to be afebrile without tachycardia.  WBC 16.7.  LFTs within normal limits. CT A/P compatible with acute appendicitis.  No abscess or perforation.  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 We were asked to see. [facility] called for admission.   ROS: Review of Systems  Constitutional: Positive for chills and fever.  Respiratory: Negative for cough and shortness of breath.   Cardiovascular: Negative for chest pain and leg swelling.  Gastrointestinal: Positive for abdominal pain, nausea and vomiting. Negative for constipation.  Genitourinary: Negative for dysuria.  Musculoskeletal: Negative for back pain.  Psychiatric/Behavioral: Positive for substance abuse.  All other systems reviewed and are negative.   Family History  Problem Relation Age of Onset   Diabetes Mother    Colon cancer Maternal Grandmother    Stomach cancer Maternal Grandmother    Diabetes Maternal  Grandmother    Stomach cancer Maternal Grandfather    Diabetes Maternal Grandfather     Past Medical History:  Diagnosis Date   GERD (gastroesophageal reflux disease)    Graves disease 08/18/2015   Hyperlipidemia    Hypothyroid    Palpitations    PSC (primary sclerosing cholangitis)    Thrombosis    superior mesenteric vein - 2019   Vitamin D deficiency     Past Surgical History:  Procedure Laterality Date   ANTERIOR CRUCIATE LIGAMENT REPAIR  2012   ERCP N/A 08/19/2018   Procedure: ENDOSCOPIC RETROGRADE CHOLANGIOPANCREATOGRAPHY (ERCP);  Surgeon: Milus Banister, MD;  Location: [facility];  Service: Endoscopy;  Laterality: N/A;   REMOVAL OF STONES  08/19/2018   Procedure: REMOVAL OF STONES;  Surgeon: Milus Banister, MD;  Location: [facility];  Service: Endoscopy;;   SPHINCTEROTOMY  08/19/2018   Procedure: Joan Mayans;  Surgeon: Milus Banister, MD;  Location: [facility];  Service: Endoscopy;;   WISDOM TOOTH EXTRACTION      Social History:  reports that he has been smoking. He has never used smokeless tobacco. He reports current drug use. Drug: Marijuana. He reports that he does not drink alcohol. Not currently working Lives at home with mother and brother Reports marijuana use.  No other illicit drug use Occasional tobacco use No alcohol use  Allergies:  Allergies  Allergen Reactions   Tylenol [Acetaminophen] Other (See Comments)    r/t elevated LFTs     (Not in a hospital admission)    Physical Exam: Blood pressure (!) 156/87, pulse 54, temperature 98.3 F (36.8 C), temperature source Oral, resp. rate 20, height 6\' 3"  (1.905 m), weight Marland Kitchen)  110.3 kg, SpO2 98 %. General: pleasant, WD/WN male who is laying in bed in NAD HEENT: head is normocephalic, atraumatic.  Sclera are noninjected.  PERRL.  Ears and nose without any masses or lesions.  Mouth is pink and moist. Dentition fair Neck: Supple, no LAD. Trachea midline. No stridor.  Heart:  regular, rate, and rhythm.  Normal s1,s2. No obvious murmurs, gallops, or rubs noted.  Palpable pedal pulses bilaterally  Lungs: CTAB, no wheezes, rhonchi, or rales noted.  Respiratory effort nonlabored Abd: Soft, ND, RLQ>suprapubic abdominal tenderness without rebound, rigidity or guarding. +BS, no masses, hernias, or organomegaly. Prior laparoscopic scars are well healed.  MS: no BUE/BLE edema, calves soft and nontender Skin: warm and dry with no masses, lesions, or rashes Psych: A&Ox4 with an appropriate affect Neuro: cranial nerves grossly intact, equal strength in BUE/BLE bilaterally, normal speech, though process intact   Results for orders placed or performed during the hospital encounter of 05/10/20 (from the past 48 hour(s))  Urinalysis, Routine w reflex microscopic     Status: Abnormal   Collection Time: 05/10/20  9:01 AM  Result Value Ref Range   Color, Urine YELLOW YELLOW   APPearance CLEAR CLEAR   Specific Gravity, Urine 1.010 1.005 - 1.030   pH 8.0 5.0 - 8.0   Glucose, UA NEGATIVE NEGATIVE mg/dL   Hgb urine dipstick MODERATE (A) NEGATIVE   Bilirubin Urine NEGATIVE NEGATIVE   Ketones, ur 40 (A) NEGATIVE mg/dL   Protein, ur NEGATIVE NEGATIVE mg/dL   Nitrite NEGATIVE NEGATIVE   Leukocytes,Ua NEGATIVE NEGATIVE    Comment: Performed at [facility], [address] [place], [place] [address]  Urinalysis, Microscopic (reflex)     Status: Abnormal   Collection Time: 05/10/20  9:01 AM  Result Value Ref Range   RBC / HPF 0-5 0 - 5 RBC/hpf   WBC, UA NONE SEEN 0 - 5 WBC/hpf   Bacteria, UA FEW (A) NONE SEEN   Squamous Epithelial / LPF 0-5 0 - 5    Comment: Performed at [facility], [address] [place], [place] [address]  SARS Coronavirus 2 by RT PCR (hospital order, performed in [facility] hospital lab) Nasopharyngeal Nasopharyngeal Swab     Status: None   Collection Time: 05/10/20  9:31 AM   Specimen: Nasopharyngeal Swab  Result Value Ref Range    SARS Coronavirus 2 NEGATIVE NEGATIVE    Comment: (NOTE) SARS-CoV-2 target nucleic acids are NOT DETECTED.  The SARS-CoV-2 RNA is generally detectable in upper and lower respiratory specimens during the acute phase of infection. The lowest concentration of SARS-CoV-2 viral copies this assay can detect is 250 copies / mL. A negative result does not preclude SARS-CoV-2 infection and should not be used as the sole basis for treatment or other patient management decisions.  A negative result may occur with improper specimen collection / handling, submission of specimen other than nasopharyngeal swab, presence of viral mutation(s) within the areas targeted by this assay, and inadequate number of viral copies (<250 copies / mL). A negative result must be combined with clinical observations, patient history, and epidemiological information.  Fact Sheet for Patients:   StrictlyIdeas.no  Fact Sheet for Healthcare Providers: BankingDealers.co.za  This test is not yet approved or  cleared by the [place] FDA and has been authorized for detection and/or diagnosis of SARS-CoV-2 by FDA under an Emergency Use Authorization (EUA).  This EUA will remain in effect (meaning this test can be used)  for the duration of the COVID-19 declaration under Section 564(b)(1) of the Act, 21 U.S.C. section 360bbb-3(b)(1), unless the authorization is terminated or revoked sooner.  Performed at [facility], [address] [place], [place] [address]    CT Abdomen Pelvis W Contrast  Result Date: 05/10/2020 CLINICAL DATA:  29 year old male with history of acute onset of left lower quadrant tenderness and left-sided flank pain. Leukocytosis. History of primary sclerosing cholangitis. EXAM: CT ABDOMEN AND PELVIS WITH CONTRAST TECHNIQUE: Multidetector CT imaging of the abdomen and pelvis was performed using the standard protocol following bolus  administration of intravenous contrast. CONTRAST:  130mL OMNIPAQUE IOHEXOL 300 MG/ML  SOLN COMPARISON:  CT the abdomen and pelvis 07/05/2018. FINDINGS: Lower chest: Unremarkable. Hepatobiliary: Liver has a irregular contour, and there is moderate intrahepatic biliary ductal dilatation, increased slightly compared to prior studies, compatible with reported clinical history of primary sclerosing cholangitis. In segment 7 of the liver there is a 2.6 x 2.0 cm intermediate attenuation lesion (axial image 22 of series 2). In segment 5 of the liver there is a 1.5 x 1.4 cm intermediate attenuation lesion (axial image 30 of series 2). Both of these lesions were previously characterized as cavernous hemangiomas on prior abdominal MRI 08/16/2018. No other suspicious hepatic lesions. Status post cholecystectomy. Cavernous transformation in the porta hepatis. Common bile duct is normal in caliber. Pancreas: No pancreatic mass. No pancreatic ductal dilatation. No pancreatic or peripancreatic fluid collections or inflammatory changes. Spleen: Unremarkable. Adrenals/Urinary Tract: Bilateral kidneys and adrenal glands are normal in appearance. No hydroureteronephrosis. Urinary bladder is normal in appearance. Stomach/Bowel: Normal appearance of the stomach. No pathologic dilatation of small bowel or colon. The appendix is dilated and inflamed, concerning for an acute appendicitis. Appendix: Location: Low anatomic pelvis Diameter: 2.0 cm Appendicolith: None Mucosal hyper-enhancement: Present Extraluminal gas: None Periappendiceal collection: None Vascular/Lymphatic: No significant atherosclerotic disease, aneurysm or dissection noted in the abdominal or pelvic vasculature. 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 No lymphadenopathy noted in the abdomen or pelvis. Reproductive: Prostate gland and seminal vesicles are unremarkable in appearance. Other: No significant volume of  ascites.  No pneumoperitoneum. Musculoskeletal: There are no aggressive appearing lytic or blastic lesions noted in the visualized portions of the skeleton. IMPRESSION: 1. Findings are compatible with acute appendicitis. No periappendiceal abscess or signs of frank perforation are noted at this time. Surgical consultation is recommended. 2. Chronic changes related to primary sclerosing cholangitis redemonstrated with increased intrahepatic biliary ductal dilatation, as above. 3. 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 4. 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 Electronically Signed   By: Vinnie Langton M.D.   On: 05/10/2020 08:33    Anti-infectives (From admission, onward)   Start     Dose/Rate Route Frequency Ordered Stop   05/10/20 0915  cefTRIAXone (ROCEPHIN) 2 g in sodium chloride 0.9 % 100 mL IVPB       "And" Linked Group Details   2 g 200 mL/hr over 30 Minutes Intravenous  Once 05/10/20 0902 05/10/20 1223   05/10/20 0915  metroNIDAZOLE (FLAGYL) IVPB 500 mg       "And" Linked Group Details   500 mg 100 mL/hr over 60 Minutes Intravenous  Once 05/10/20 0902 05/10/20 1115       Assessment/Plan HTN HLD Grave's disease Primary sclerosing cholangitis s/p lap chole (in CTL) - followed by Dr Havery Moros per  PCP's note  Protein S deficiency Hx 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 His last dose was yesterday, he is unsure if it was in the AM or PM. Will plan for Laparoscopic Appendectomy later this week after the Eliquis has worn off.   FEN - NPO, IVF VTE - SCDs, please hold Eliquis  ID - Rocephin/Flagyl  Jillyn Ledger, [facility] 05/10/2020, 1:30 PM Please see Amion for pager number during day hours 7:00am-4:30pm

## 2020-05-10 NOTE — ED Notes (Signed)
Pt and all belongings transported up stairs by [facility]

## 2020-05-10 NOTE — ED Notes (Signed)
Patient transported to CT 

## 2020-05-10 NOTE — ED Triage Notes (Addendum)
Generalized abdominal pain since last night with vomiting but no diarrhea.  Pt sts the pain is not as bad now as it was but it has localized more on the left upper side.  Pt is sticking his finger down his throat to make himself vomit while being triaged.  Sts he feels like "it needs to come up".  Pt was just at [facility], was triaged, and left because "they didn't have any more rooms."

## 2020-05-10 NOTE — H&P (Addendum)
History and Physical        Hospital Admission Note Date: 05/10/2020  Patient name: Kyle Gonzales Medical record number: [id number] Date of birth: [birth year] Age: 29 y.o. Gender: male  PCP: [name], MD  Patient coming from: home At baseline, ambulates: independently  Chief Complaint    Chief Complaint  Patient presents with  . Abdominal Pain      HPI:   This is a 29 year old male with past medical history of primary sclerosing cholangitis s/p lap chole with Dr. 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  He is abdominal pain eventually moved to his left flank.  CT abdomen pelvis at [facility] showed acute nonperforated appendicitis.  He was given ceftriaxone and Flagyl transferred to [facility] after ED physician had discussion with Dr. Hassell Done, general surgery on-call.  Of note, patient took his last Eliquis dose yesterday but unsure AM or PM. [facility] was consulted to admit due to multiple medical problems and surgery is to see in consult.   Currently, patient states that he is feeling better than when he presented and does not have any significant symptoms at this time.    Vitals:   05/10/20 1300 05/10/20 1534  BP: (!) 156/87 (!) 140/70  Pulse: 54 53  Resp: 20 16  Temp:  98.1 F (36.7 C)  SpO2: 98% 100%     Review of Systems:  Review of Systems  Constitutional: Negative for chills and fever.  Respiratory: Negative for cough and shortness of breath.   Cardiovascular: Negative for chest pain and palpitations.  Gastrointestinal: Negative for blood in  stool, diarrhea, nausea and vomiting.  Genitourinary: Negative.   Musculoskeletal: Negative.   All other systems reviewed and are negative.   Medical/Social/Family History   Past Medical History: Past Medical History:  Diagnosis Date  . GERD (gastroesophageal reflux disease)   . Graves disease 08/18/2015  . Hyperlipidemia   . Hypothyroid   . Palpitations   . PSC (primary sclerosing cholangitis)   . Thrombosis    superior mesenteric vein - 2019  . Vitamin D deficiency     Past Surgical History:  Procedure Laterality Date  . ANTERIOR CRUCIATE LIGAMENT REPAIR  2012  . ERCP N/A 08/19/2018   Procedure: ENDOSCOPIC RETROGRADE CHOLANGIOPANCREATOGRAPHY (ERCP);  Surgeon: Milus Banister, MD;  Location: [facility];  Service: Endoscopy;  Laterality: N/A;  . REMOVAL OF STONES  08/19/2018   Procedure: REMOVAL OF STONES;  Surgeon: Milus Banister, MD;  Location: [facility] ENDOSCOPY;  Service: Endoscopy;;  . Joan Mayans  08/19/2018   Procedure: Joan Mayans;  Surgeon: Milus Banister, MD;  Location: [facility] ENDOSCOPY;  Service: Endoscopy;;  . WISDOM TOOTH EXTRACTION      Medications: Prior to Admission medications   Medication Sig Start Date End Date Taking? Authorizing Provider  alum & mag hydroxide-simeth (MAALOX ADVANCED MAX ST) 400-400-40 MG/5ML suspension Take 15 mLs by mouth every 6 (six) hours as needed for indigestion. 07/23/18  Yes  Fatima Blank, MD  apixaban (ELIQUIS) 5 MG TABS tablet TAKE 1 TABLET(5 MG) BY MOUTH TWICE DAILY Patient taking differently: Take 5 mg by mouth 2 (two) times daily.  01/13/19  Yes [name], MD  bisoprolol-hydrochlorothiazide [facility]) 10-6.25 MG tablet Take 1 tablet Daily for BP Patient taking differently: Take 1 tablet by mouth daily.  02/19/20  Yes Liane Comber, NP  busPIRone (BUSPAR) 10 MG tablet Take 1 tablet 3 x /day for Anxiety Patient taking differently: Take 10 mg by mouth 3 (three) times daily.  12/28/19  Yes [name], MD    Cholecalciferol (VITAMIN D3) 5000 units CAPS Take 1 capsule (5,000 Units total) by mouth every other day. Patient taking differently: Take 5,000 Units by mouth daily.  05/02/18  Yes Elgergawy, Silver Huguenin, MD  escitalopram (LEXAPRO) 20 MG tablet Take 1 tablet Daily for Mood, Anxiety, Irritability  & Depression Patient taking differently: Take 20 mg by mouth daily.  07/19/19  Yes [name], MD  ezetimibe (ZETIA) 10 MG tablet Take 1 tablet Daily for Cholesterol Patient taking differently: Take 10 mg by mouth daily.  12/28/19  Yes [name], MD  levothyroxine (SYNTHROID) 100 MCG tablet Take 1 tablet daily on an empty stomach with only water for 30 minutes & no Antacid meds, Calcium or Magnesium for 4 hours & avoid Biotin Patient taking differently: Take 100 mcg by mouth daily before breakfast.  02/16/20  Yes [name], MD  omeprazole (PRILOSEC) 40 MG capsule TAKE ONE CAPSULE BY MOUTH EVERY MORNING BEFORE BREAKFAST Patient taking differently: Take 40 mg by mouth daily.  02/19/20  Yes Armbruster, Carlota Raspberry, MD  ondansetron (ZOFRAN) 8 MG tablet Take 1 tablet 3 x /day . . . ONLY . Marland Kitchen .  if needed for Nausea Patient taking differently: Take 8 mg by mouth daily.  03/17/20  Yes [name], MD    Allergies:   Allergies  Allergen Reactions  . Tylenol [Acetaminophen] Other (See Comments)    r/t elevated LFTs     Social History:  reports that he has been smoking. He has never used smokeless tobacco. He reports current drug use. Drug: Marijuana. He reports that he does not drink alcohol.  Family History: Family History  Problem Relation Age of Onset  . Diabetes Mother   . Colon cancer Maternal Grandmother   . Stomach cancer Maternal Grandmother   . Diabetes Maternal Grandmother   . Stomach cancer Maternal Grandfather   . Diabetes Maternal Grandfather      Objective   Physical Exam: Blood pressure (!) 140/70, pulse 53, temperature 98.1 F (36.7 C), temperature source Oral,  resp. rate 16, height 6\' 3"  (1.905 m), weight (!) 110.3 kg, SpO2 100 %.  Physical Exam Vitals and nursing note reviewed.  Constitutional:      Appearance: Normal appearance.  HENT:     Head: Normocephalic and atraumatic.  Eyes:     Conjunctiva/sclera: Conjunctivae normal.  Cardiovascular:     Rate and Rhythm: Normal rate and regular rhythm.  Pulmonary:     Effort: Pulmonary effort is normal.     Breath sounds: Normal breath sounds.  Abdominal:     General: Abdomen is flat.     Palpations: Abdomen is soft.  Musculoskeletal:        General: No swelling or tenderness.  Skin:    Coloration: Skin is not jaundiced or pale.  Neurological:     Mental Status: He is alert. Mental status is at baseline.  Psychiatric:  Mood and Affect: Mood normal.        Behavior: Behavior normal.     LABS on Admission: I have personally reviewed all the labs and imaging below    Basic Metabolic Panel: Recent Labs  Lab 05/05/20 1614 05/10/20 0149  NA 140 137  K 4.0 3.1*  CL 106 101  CO2 25 21*  GLUCOSE 97 158*  BUN 13 13  CREATININE 1.14 1.29*  CALCIUM 10.0 9.7  MG 2.1  --    Liver Function Tests: Recent Labs  Lab 05/05/20 1614 05/10/20 0149  AST 20 27  ALT 19 22  ALKPHOS  --  46  BILITOT 0.9 1.0  PROT 8.3* 8.7*  ALBUMIN  --  5.4*   Recent Labs  Lab 05/10/20 0149  LIPASE 26   No results for input(s): AMMONIA in the last 168 hours. CBC: Recent Labs  Lab 05/05/20 1614 05/05/20 1614 05/10/20 0149  WBC 6.3  --  16.7*  NEUTROABS 4,990  --   --   HGB 14.9  --  14.7  HCT 44.5  --  44.2  MCV 91.6   < > 91.5  PLT 135*  --  204   < > = values in this interval not displayed.   Cardiac Enzymes: No results for input(s): CKTOTAL, CKMB, CKMBINDEX, TROPONINI in the last 168 hours. BNP: Invalid input(s): POCBNP CBG: No results for input(s): GLUCAP in the last 168 hours.  Radiological Exams on Admission:  CT Abdomen Pelvis W Contrast  Result Date: 05/10/2020 CLINICAL  DATA:  29 year old male with history of acute onset of left lower quadrant tenderness and left-sided flank pain. Leukocytosis. History of primary sclerosing cholangitis. EXAM: CT ABDOMEN AND PELVIS WITH CONTRAST TECHNIQUE: Multidetector CT imaging of the abdomen and pelvis was performed using the standard protocol following bolus administration of intravenous contrast. CONTRAST:  141mL OMNIPAQUE IOHEXOL 300 MG/ML  SOLN COMPARISON:  CT the abdomen and pelvis 07/05/2018. FINDINGS: Lower chest: Unremarkable. Hepatobiliary: Liver has a irregular contour, and there is moderate intrahepatic biliary ductal dilatation, increased slightly compared to prior studies, compatible with reported clinical history of primary sclerosing cholangitis. In segment 7 of the liver there is a 2.6 x 2.0 cm intermediate attenuation lesion (axial image 22 of series 2). In segment 5 of the liver there is a 1.5 x 1.4 cm intermediate attenuation lesion (axial image 30 of series 2). Both of these lesions were previously characterized as cavernous hemangiomas on prior abdominal MRI 08/16/2018. No other suspicious hepatic lesions. Status post cholecystectomy. Cavernous transformation in the porta hepatis. Common bile duct is normal in caliber. Pancreas: No pancreatic mass. No pancreatic ductal dilatation. No pancreatic or peripancreatic fluid collections or inflammatory changes. Spleen: Unremarkable. Adrenals/Urinary Tract: Bilateral kidneys and adrenal glands are normal in appearance. No hydroureteronephrosis. Urinary bladder is normal in appearance. Stomach/Bowel: Normal appearance of the stomach. No pathologic dilatation of small bowel or colon. The appendix is dilated and inflamed, concerning for an acute appendicitis. Appendix: Location: Low anatomic pelvis Diameter: 2.0 cm Appendicolith: None Mucosal hyper-enhancement: Present Extraluminal gas: None Periappendiceal collection: None Vascular/Lymphatic: No significant atherosclerotic disease,  aneurysm or dissection noted in the abdominal or pelvic vasculature. 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 No lymphadenopathy noted in the abdomen or pelvis. Reproductive: Prostate gland and seminal vesicles are unremarkable in appearance. Other: No significant volume of ascites.  No pneumoperitoneum. Musculoskeletal: There are no aggressive appearing lytic or blastic lesions noted in the visualized portions  of the skeleton. IMPRESSION: 1. Findings are compatible with acute appendicitis. No periappendiceal abscess or signs of frank perforation are noted at this time. Surgical consultation is recommended. 2. Chronic changes related to primary sclerosing cholangitis redemonstrated with increased intrahepatic biliary ductal dilatation, as above. 3. 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 4. 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 Electronically Signed   By: Vinnie Langton M.D.   On: 05/10/2020 08:33      EKG: Not done  A & P   Principal Problem:   Appendicitis Active Problems:   Hypothyroidism   Hyperlipidemia, mixed   Abnormal LFTs   Hypertension   DVT (deep venous thrombosis) (HCC)   Protein S deficiency ([place])   1. Acute appendicitis without perforation a. Continue ceftriaxone and metronidazole b. Pain control per surgery c. Continue with bowel rest/NPO d. IV fluids for now  2. Hypertension a. Holding home bisoprolol-HCTZ due to NPO and AKI b. Lopressor 5 mg IV Q6H PRN for hypertension  3. Hyperlipidemia a. Holding home meds  4. Grave's Disease s/p RAI ablation, now acquired hypothyroidism a. TSH increased to 9.45 despite levothyroxine b. Will increase levothyroxine to 125 mcg when taking PO c. Outpatient follow up   5. Protein S Deficiency with history of SMV thrombosis on chronic eliquis a. Hold eliquis for  surgery b. Start heparin drip/bridge as he is at high risk of VTE  6. AKI a. Hold HCTZ  b. IV fluids   DVT prophylaxis: heparin   Code Status: Prior  Diet: npo Family Communication: Admission, patients condition and plan of care including tests being ordered have been discussed with the patient who indicates understanding and agrees with the plan and Code Status.  Disposition Plan: The appropriate patient status for this patient is INPATIENT. Inpatient status is judged to be reasonable and necessary in order to provide the required intensity of service to ensure the patient's safety. The patient's presenting symptoms, physical exam findings, and initial radiographic and laboratory data in the context of their chronic comorbidities is felt to place them at high risk for further clinical deterioration. Furthermore, it is not anticipated that the patient will be medically stable for discharge from the hospital within 2 midnights of admission. The following factors support the patient status of inpatient.   " The patient's presenting symptoms include abdominal pain. " The worrisome physical exam findings include abdominal pain. " The initial radiographic and laboratory data are worrisome because of appendicitis . " The chronic co-morbidities include protein S deficiency, SMV thrombosis on chronic eliquis, hypertension, hyperlipidemia.   * I certify that at the point of admission it is my clinical judgment that the patient will require inpatient hospital care spanning beyond 2 midnights from the point of admission due to high intensity of service, high risk for further deterioration and high frequency of surveillance required.*   Status is: Inpatient  Remains inpatient appropriate because:Ongoing diagnostic testing needed not appropriate for outpatient work up, IV treatments appropriate due to intensity of illness or inability to take PO and Inpatient level of care appropriate due to severity of  illness   Dispo: The patient is from: Home              Anticipated d/c is to: Home              Anticipated d/c date is: 2 days  Patient currently is not medically stable to d/c.      Consultants  . General surgery  Procedures  . none  Time Spent on Admission: 67 minutes    Harold Hedge, DO [facility] Pager [phone] 05/10/2020, 5:01 PM

## 2020-05-10 NOTE — ED Triage Notes (Signed)
Patient arrived via [facility] with complaints of sharp abdominal pain after eating chinese food x2 hours. Reports some nausea and vomiting.

## 2020-05-10 NOTE — ED Notes (Signed)
ED Provider at bedside. 

## 2020-05-11 ENCOUNTER — Inpatient Hospital Stay (HOSPITAL_COMMUNITY): Payer: Self-pay | Admitting: Anesthesiology

## 2020-05-11 ENCOUNTER — Encounter (HOSPITAL_COMMUNITY)
Admission: EM | Disposition: A | Discharge status: Home / Self Care | Payer: Self-pay | Source: Home / Self Care | Attending: Internal Medicine

## 2020-05-11 ENCOUNTER — Encounter (HOSPITAL_COMMUNITY): Payer: Self-pay | Admitting: Internal Medicine

## 2020-05-11 DIAGNOSIS — E039 Hypothyroidism, unspecified: Secondary | ICD-10-CM

## 2020-05-11 HISTORY — PX: LAPAROSCOPIC APPENDECTOMY: SHX408

## 2020-05-11 LAB — CBC
HCT: 43 % (ref 39.0–52.0)
Hemoglobin: 14.3 g/dL (ref 13.0–17.0)
MCH: 30.8 pg (ref 26.0–34.0)
MCHC: 33.3 g/dL (ref 30.0–36.0)
MCV: 92.5 fL (ref 80.0–100.0)
Platelets: 103 10*3/uL — ABNORMAL LOW (ref 150–400)
RBC: 4.65 MIL/uL (ref 4.22–5.81)
RDW: 13.5 % (ref 11.5–15.5)
WBC: 8.4 10*3/uL (ref 4.0–10.5)
nRBC: 0 % (ref 0.0–0.2)

## 2020-05-11 LAB — BASIC METABOLIC PANEL
Anion gap: 12 (ref 5–15)
BUN: 9 mg/dL (ref 6–20)
CO2: 24 mmol/L (ref 22–32)
Calcium: 9 mg/dL (ref 8.9–10.3)
Chloride: 103 mmol/L (ref 98–111)
Creatinine, Ser: 1.01 mg/dL (ref 0.61–1.24)
GFR calc Af Amer: >60 mL/min (ref >60)
GFR calc non Af Amer: >60 mL/min (ref >60)
Glucose, Bld: 122 mg/dL — ABNORMAL HIGH (ref 70–99)
Potassium: 3.1 mmol/L — ABNORMAL LOW (ref 3.5–5.1)
Sodium: 139 mmol/L (ref 135–145)

## 2020-05-11 LAB — MAGNESIUM: Magnesium: 1.7 mg/dL (ref 1.7–2.4)

## 2020-05-11 LAB — HIV ANTIBODY (ROUTINE TESTING W REFLEX): HIV Screen 4th Generation wRfx: NONREACTIVE

## 2020-05-11 LAB — SURGICAL PCR SCREEN
MRSA, PCR: NEGATIVE
Staphylococcus aureus: NEGATIVE

## 2020-05-11 LAB — APTT: aPTT: 117 seconds — ABNORMAL HIGH (ref 24–36)

## 2020-05-11 SURGERY — APPENDECTOMY, LAPAROSCOPIC
Anesthesia: General | Site: Abdomen

## 2020-05-11 MED ORDER — FENTANYL CITRATE (PF) 250 MCG/5ML IJ SOLN
INTRAMUSCULAR | Status: AC
  Filled 2020-05-11: qty 5

## 2020-05-11 MED ORDER — ONDANSETRON HCL 4 MG/2ML IJ SOLN: 4.0000 mg | Freq: Once | INTRAMUSCULAR | Status: DC | PRN

## 2020-05-11 MED ORDER — LACTATED RINGERS IR SOLN
Status: DC | PRN
  Administered 2020-05-11: 1

## 2020-05-11 MED ORDER — LACTATED RINGERS IV SOLN: INTRAVENOUS | Status: DC

## 2020-05-11 MED ORDER — PANTOPRAZOLE SODIUM 40 MG IV SOLR
40.0000 mg | Freq: Every day | INTRAVENOUS | Status: DC
  Administered 2020-05-11 – 2020-05-12 (×2): 40 mg via INTRAVENOUS
  Filled 2020-05-11 (×3): qty 40

## 2020-05-11 MED ORDER — OXYCODONE HCL 5 MG/5ML PO SOLN: 5.0000 mg | Freq: Once | ORAL | Status: DC | PRN

## 2020-05-11 MED ORDER — 0.9 % SODIUM CHLORIDE (POUR BTL) OPTIME
TOPICAL | Status: DC | PRN
  Administered 2020-05-11: 1000 mL

## 2020-05-11 MED ORDER — MIDAZOLAM HCL 2 MG/2ML IJ SOLN
INTRAMUSCULAR | Status: DC | PRN
  Administered 2020-05-11: 2 mg via INTRAVENOUS

## 2020-05-11 MED ORDER — DEXAMETHASONE SODIUM PHOSPHATE 10 MG/ML IJ SOLN
INTRAMUSCULAR | Status: AC
  Filled 2020-05-11: qty 1

## 2020-05-11 MED ORDER — HYDROMORPHONE HCL 1 MG/ML IJ SOLN
1.0000 mg | Freq: Once | INTRAMUSCULAR | Status: AC
  Administered 2020-05-11: 17:00:00 1 mg via INTRAVENOUS
  Filled 2020-05-11: qty 1

## 2020-05-11 MED ORDER — ROCURONIUM BROMIDE 10 MG/ML (PF) SYRINGE
PREFILLED_SYRINGE | INTRAVENOUS | Status: DC | PRN
  Administered 2020-05-11 (×5): 10 mg via INTRAVENOUS
  Administered 2020-05-11: 40 mg via INTRAVENOUS

## 2020-05-11 MED ORDER — LIDOCAINE 2% (20 MG/ML) 5 ML SYRINGE
INTRAMUSCULAR | Status: DC | PRN
  Administered 2020-05-11: 100 mg via INTRAVENOUS

## 2020-05-11 MED ORDER — POTASSIUM CHLORIDE 10 MEQ/100ML IV SOLN
10.0000 meq | INTRAVENOUS | Status: DC
  Administered 2020-05-11 (×3): 10 meq via INTRAVENOUS
  Filled 2020-05-11 (×3): qty 100

## 2020-05-11 MED ORDER — OXYCODONE HCL 5 MG PO TABS: 5.0000 mg | ORAL_TABLET | Freq: Once | ORAL | Status: DC | PRN

## 2020-05-11 MED ORDER — HYDROMORPHONE HCL 2 MG/ML IJ SOLN
INTRAMUSCULAR | Status: AC
  Filled 2020-05-11: qty 1

## 2020-05-11 MED ORDER — PROPOFOL 10 MG/ML IV BOLUS
INTRAVENOUS | Status: DC | PRN
  Administered 2020-05-11: 200 mg via INTRAVENOUS

## 2020-05-11 MED ORDER — HYDROMORPHONE HCL 1 MG/ML IJ SOLN
INTRAMUSCULAR | Status: DC | PRN
  Administered 2020-05-11 (×4): .5 mg via INTRAVENOUS

## 2020-05-11 MED ORDER — ONDANSETRON HCL 4 MG/2ML IJ SOLN
4.0000 mg | Freq: Four times a day (QID) | INTRAMUSCULAR | Status: DC | PRN
  Administered 2020-05-11 – 2020-05-18 (×10): 4 mg via INTRAVENOUS
  Filled 2020-05-11 (×10): qty 2

## 2020-05-11 MED ORDER — HYDROMORPHONE HCL 1 MG/ML IJ SOLN: 0.2500 mg | INTRAMUSCULAR | Status: DC | PRN

## 2020-05-11 MED ORDER — OXYCODONE HCL 5 MG PO TABS
5.0000 mg | ORAL_TABLET | ORAL | Status: DC | PRN
  Administered 2020-05-11 – 2020-05-13 (×3): 10 mg via ORAL
  Filled 2020-05-11 (×5): qty 2

## 2020-05-11 MED ORDER — ONDANSETRON 4 MG PO TBDP: 4.0000 mg | ORAL_TABLET | Freq: Four times a day (QID) | ORAL | Status: DC | PRN

## 2020-05-11 MED ORDER — SUCCINYLCHOLINE CHLORIDE 200 MG/10ML IV SOSY
PREFILLED_SYRINGE | INTRAVENOUS | Status: DC | PRN
  Administered 2020-05-11: 120 mg via INTRAVENOUS

## 2020-05-11 MED ORDER — BUPIVACAINE LIPOSOME 1.3 % IJ SUSP
20.0000 mL | Freq: Once | INTRAMUSCULAR | Status: AC
  Administered 2020-05-11: 20 mL
  Filled 2020-05-11: qty 20

## 2020-05-11 MED ORDER — FENTANYL CITRATE (PF) 250 MCG/5ML IJ SOLN
INTRAMUSCULAR | Status: DC | PRN
  Administered 2020-05-11 (×2): 50 ug via INTRAVENOUS
  Administered 2020-05-11 (×2): 100 ug via INTRAVENOUS
  Administered 2020-05-11: 50 ug via INTRAVENOUS

## 2020-05-11 MED ORDER — MORPHINE SULFATE (PF) 2 MG/ML IV SOLN
1.0000 mg | INTRAVENOUS | Status: DC | PRN
  Administered 2020-05-12 – 2020-05-14 (×12): 1 mg via INTRAVENOUS
  Filled 2020-05-11 (×11): qty 1

## 2020-05-11 MED ORDER — SODIUM CHLORIDE (PF) 0.9 % IJ SOLN
INTRAMUSCULAR | Status: AC
  Filled 2020-05-11: qty 10

## 2020-05-11 MED ORDER — HEPARIN (PORCINE) 25000 UT/250ML-% IV SOLN
1300.0000 [IU]/h | INTRAVENOUS | Status: DC
  Administered 2020-05-11: 1300 [IU]/h via INTRAVENOUS
  Filled 2020-05-11 (×2): qty 250

## 2020-05-11 MED ORDER — ONDANSETRON HCL 4 MG/2ML IJ SOLN
INTRAMUSCULAR | Status: AC
  Filled 2020-05-11: qty 2

## 2020-05-11 MED ORDER — ONDANSETRON HCL 4 MG/2ML IJ SOLN
INTRAMUSCULAR | Status: DC | PRN
  Administered 2020-05-11: 4 mg via INTRAVENOUS

## 2020-05-11 MED ORDER — DEXAMETHASONE SODIUM PHOSPHATE 10 MG/ML IJ SOLN
INTRAMUSCULAR | Status: DC | PRN
  Administered 2020-05-11: 10 mg via INTRAVENOUS

## 2020-05-11 MED ORDER — FENTANYL CITRATE (PF) 100 MCG/2ML IJ SOLN
INTRAMUSCULAR | Status: AC
  Filled 2020-05-11: qty 2

## 2020-05-11 MED ORDER — HEPARIN (PORCINE) 25000 UT/250ML-% IV SOLN
1300.0000 [IU]/h | INTRAVENOUS | Status: DC
  Filled 2020-05-11: qty 250

## 2020-05-11 MED ORDER — PIPERACILLIN-TAZOBACTAM 3.375 G IVPB
3.3750 g | Freq: Three times a day (TID) | INTRAVENOUS | Status: DC
  Administered 2020-05-11 – 2020-05-18 (×20): 3.375 g via INTRAVENOUS
  Filled 2020-05-11 (×21): qty 50

## 2020-05-11 MED ORDER — ROCURONIUM BROMIDE 10 MG/ML (PF) SYRINGE
PREFILLED_SYRINGE | INTRAVENOUS | Status: AC
  Filled 2020-05-11: qty 10

## 2020-05-11 MED ORDER — KCL IN DEXTROSE-NACL 20-5-0.45 MEQ/L-%-% IV SOLN
INTRAVENOUS | Status: DC
  Filled 2020-05-11 (×7): qty 1000

## 2020-05-11 MED ORDER — SUGAMMADEX SODIUM 200 MG/2ML IV SOLN
INTRAVENOUS | Status: DC | PRN
  Administered 2020-05-11: 220 mg via INTRAVENOUS

## 2020-05-11 MED ORDER — METOPROLOL TARTRATE 5 MG/5ML IV SOLN: 5.0000 mg | Freq: Four times a day (QID) | INTRAVENOUS | Status: DC | PRN

## 2020-05-11 MED ORDER — MIDAZOLAM HCL 2 MG/2ML IJ SOLN
INTRAMUSCULAR | Status: AC
  Filled 2020-05-11: qty 2

## 2020-05-11 MED ORDER — POTASSIUM CHLORIDE 2 MEQ/ML IV SOLN
INTRAVENOUS | Status: DC
  Filled 2020-05-11: qty 1000

## 2020-05-11 MED ORDER — PROPOFOL 10 MG/ML IV BOLUS
INTRAVENOUS | Status: AC
  Filled 2020-05-11: qty 20

## 2020-05-11 MED ORDER — LIDOCAINE 2% (20 MG/ML) 5 ML SYRINGE
INTRAMUSCULAR | Status: AC
  Filled 2020-05-11: qty 5

## 2020-05-11 SURGICAL SUPPLY — 43 items
APPLIER CLIP ROT 10 11.4 M/L (STAPLE)
CABLE HIGH FREQUENCY MONO STRZ (ELECTRODE) IMPLANT
CHLORAPREP W/TINT 26 (MISCELLANEOUS) ×3 IMPLANT
CLIP APPLIE ROT 10 11.4 M/L (STAPLE) IMPLANT
COVER SURGICAL LIGHT HANDLE (MISCELLANEOUS) ×3 IMPLANT
COVER WAND RF STERILE (DRAPES) IMPLANT
CUTTER FLEX LINEAR 45M (STAPLE) ×3 IMPLANT
DECANTER SPIKE VIAL GLASS SM (MISCELLANEOUS) IMPLANT
DERMABOND ADVANCED (GAUZE/BANDAGES/DRESSINGS) ×2
DERMABOND ADVANCED .7 DNX12 (GAUZE/BANDAGES/DRESSINGS) ×1 IMPLANT
DRAPE LAPAROSCOPIC ABDOMINAL (DRAPES) IMPLANT
ELECT REM PT RETURN 15FT ADLT (MISCELLANEOUS) ×3 IMPLANT
ENDOLOOP SUT PDS II  0 18 (SUTURE)
ENDOLOOP SUT PDS II 0 18 (SUTURE) IMPLANT
GLOVE BIOGEL M 8.0 STRL (GLOVE) ×3 IMPLANT
GOWN STRL REUS W/TWL XL LVL3 (GOWN DISPOSABLE) ×3 IMPLANT
KIT BASIN OR (CUSTOM PROCEDURE TRAY) ×3 IMPLANT
KIT TURNOVER KIT A (KITS) IMPLANT
PAD POSITIONING PINK XL (MISCELLANEOUS) ×3 IMPLANT
PENCIL SMOKE EVACUATOR (MISCELLANEOUS) IMPLANT
POUCH RETRIEVAL ECOSAC 10 (ENDOMECHANICALS) IMPLANT
POUCH RETRIEVAL ECOSAC 10MM (ENDOMECHANICALS)
POUCH SPECIMEN RETRIEVAL 10MM (ENDOMECHANICALS) ×3 IMPLANT
RELOAD 45 VASCULAR/THIN (ENDOMECHANICALS) ×6 IMPLANT
RELOAD STAPLE 45 2.5 WHT GRN (ENDOMECHANICALS) IMPLANT
RELOAD STAPLE 45 3.5 BLU ETS (ENDOMECHANICALS) IMPLANT
RELOAD STAPLE TA45 3.5 REG BLU (ENDOMECHANICALS) IMPLANT
SCISSORS LAP 5X45 EPIX DISP (ENDOMECHANICALS) ×3 IMPLANT
SET IRRIG TUBING LAPAROSCOPIC (IRRIGATION / IRRIGATOR) ×3 IMPLANT
SET TUBE SMOKE EVAC HIGH FLOW (TUBING) ×3 IMPLANT
SHEARS HARMONIC ACE PLUS 45CM (MISCELLANEOUS) ×3 IMPLANT
SLEEVE ENDOPATH XCEL 5M (ENDOMECHANICALS) ×2 IMPLANT
SLEEVE XCEL OPT CAN 5 100 (ENDOMECHANICALS) ×3 IMPLANT
STAPLER VISISTAT 35W (STAPLE) IMPLANT
SUT MNCRL AB 4-0 PS2 18 (SUTURE) ×3 IMPLANT
TOWEL OR 17X26 10 PK STRL BLUE (TOWEL DISPOSABLE) ×3 IMPLANT
TRAY FOLEY MTR SLVR 14FR STAT (SET/KITS/TRAYS/PACK) IMPLANT
TRAY FOLEY MTR SLVR 16FR STAT (SET/KITS/TRAYS/PACK) IMPLANT
TRAY LAPAROSCOPIC (CUSTOM PROCEDURE TRAY) ×3 IMPLANT
TROCAR BLADELESS OPT 5 100 (ENDOMECHANICALS) ×3 IMPLANT
TROCAR XCEL 12X100 BLDLESS (ENDOMECHANICALS) ×2 IMPLANT
TROCAR XCEL BLUNT TIP 100MML (ENDOMECHANICALS) ×3 IMPLANT
TROCAR XCEL NON-BLD 11X100MML (ENDOMECHANICALS) ×2 IMPLANT

## 2020-05-11 NOTE — Progress Notes (Signed)
[facility] for IV heparin Indication: protein S deficiency, SMV thrombus (bridging while off Eliquis)  Allergies  Allergen Reactions   Tylenol [Acetaminophen] Other (See Comments)    r/t elevated LFTs     Patient Measurements: Height: 6\' 3"  (190.5 cm) Weight: (!) 110.3 kg (243 lb 3.2 oz) IBW/kg (Calculated) : 84.5 Heparin Dosing Weight: 107 kg  Vital Signs: Temp: 98.8 F (37.1 C) (07/27 0127) Temp Source: Oral (07/27 0127) BP: 133/72 (07/27 0127) Pulse Rate: 68 (07/27 0127)  Labs: Recent Labs    05/10/20 0149 05/10/20 1707 05/11/20 0041  HGB 14.7  --  14.3  HCT 44.2  --  43.0  PLT 204  --  103*  APTT  --  23* 117*  HEPARINUNFRC  --  0.46  --   CREATININE 1.29*  --  1.01    Estimated Creatinine Clearance: 147.3 mL/min (by C-G formula based on SCr of 1.01 mg/dL).   Medical History: Past Medical History:  Diagnosis Date   GERD (gastroesophageal reflux disease)    Graves disease 08/18/2015   Hyperlipidemia    Hypothyroid    Palpitations    PSC (primary sclerosing cholangitis)    Thrombosis    superior mesenteric vein - 2019   Vitamin D deficiency     Medications:  Medications Prior to Admission  Medication Sig Dispense Refill Last Dose   alum & mag hydroxide-simeth (MAALOX ADVANCED MAX ST) 400-400-40 MG/5ML suspension Take 15 mLs by mouth every 6 (six) hours as needed for indigestion. 355 mL 0 Past Month at Unknown time   apixaban (ELIQUIS) 5 MG TABS tablet TAKE 1 TABLET(5 MG) BY MOUTH TWICE DAILY (Patient taking differently: Take 5 mg by mouth 2 (two) times daily. ) 180 tablet 0 05/09/2020 at unk to mother    bisoprolol-hydrochlorothiazide [facility]) 10-6.25 MG tablet Take 1 tablet Daily for BP (Patient taking differently: Take 1 tablet by mouth daily. ) 90 tablet 0 05/09/2020 at Unknown time   busPIRone (BUSPAR) 10 MG tablet Take 1 tablet 3 x /day for Anxiety (Patient taking differently: Take 10 mg by mouth 3  (three) times daily. ) 270 tablet 1 05/09/2020 at Unknown time   Cholecalciferol (VITAMIN D3) 5000 units CAPS Take 1 capsule (5,000 Units total) by mouth every other day. (Patient taking differently: Take 5,000 Units by mouth daily. )   05/09/2020 at Unknown time   escitalopram (LEXAPRO) 20 MG tablet Take 1 tablet Daily for Mood, Anxiety, Irritability  & Depression (Patient taking differently: Take 20 mg by mouth daily. ) 30 tablet 0 05/09/2020 at Unknown time   ezetimibe (ZETIA) 10 MG tablet Take 1 tablet Daily for Cholesterol (Patient taking differently: Take 10 mg by mouth daily. ) 90 tablet 0 05/09/2020 at Unknown time   levothyroxine (SYNTHROID) 100 MCG tablet Take 1 tablet daily on an empty stomach with only water for 30 minutes & no Antacid meds, Calcium or Magnesium for 4 hours & avoid Biotin (Patient taking differently: Take 100 mcg by mouth daily before breakfast. ) 90 tablet 0 05/09/2020 at Unknown time   omeprazole (PRILOSEC) 40 MG capsule TAKE ONE CAPSULE BY MOUTH EVERY MORNING BEFORE BREAKFAST (Patient taking differently: Take 40 mg by mouth daily. ) 90 capsule 1 05/09/2020 at Unknown time   ondansetron (ZOFRAN) 8 MG tablet Take 1 tablet 3 x /day . . . ONLY . Marland Kitchen .  if needed for Nausea (Patient taking differently: Take 8 mg by mouth daily. ) 60 tablet 0 05/09/2020  at Unknown time   Scheduled:   gabapentin  300 mg Oral On Call to OR   [START ON 05/12/2020] levothyroxine  125 mcg Oral Q0600   scopolamine  1 patch Transdermal On Call to OR   Infusions:   cefTRIAXone (ROCEPHIN)  IV     And   metronidazole 500 mg (05/11/20 0115)   heparin 1,500 Units/hr (05/10/20 1753)   PRN: metoprolol tartrate, morphine injection, ondansetron **OR** ondansetron (ZOFRAN) IV, oxyCODONE  Assessment: 82 yoM with PMH of SMV thrombus and protein S deficiency on Eliquis PTA, admitted 7/26 for abdominal pain. Found to have acute appendicitis and Eliquis held to allow for appendectomy. Pharmacy to bridge  with heparin perioperatively.    Baseline heparin level slightly elevated at 0.46 as anticipated due to lab-drug interaction with apixaban, aPTT WNL at 23  Prior anticoagulation: Eliquis 5 mg PO bid, last dose 7/25 AM  Significant events:  Today, 05/11/2020:  Aptt above goal (117sec) on 1500 units/hr  CBC: Hg WNL, Pltc 204>>103  Fairly rapid decrease in pltc however patient's baseline appears to be 130s-150s.  He was on IV heparin infusion 7/15>>7/17 so could see heparin-induced thrombocytopenia sooner than typical timeframe of 5-7 days after initiation.  No bleeding or infusion issues per nursing  Surgery requesting heparin stop at 0400 tomorrow in anticipation of OR  Goal of Therapy: Heparin level 0.3-0.7 units/ml Monitor platelets by anticoagulation protocol: Yes  Plan:  D/C heparin now since aPTT elevated  F/U am CBC  Monitor for signs of bleeding or thrombosis  F/U plans to resume anticoagulation post-op  Netta Cedars, PharmD, BCPS 05/11/2020@3 :18 AM

## 2020-05-11 NOTE — Anesthesia Preprocedure Evaluation (Addendum)
Anesthesia Evaluation  Patient identified by MRN, date of birth, ID band Patient awake    Reviewed: Allergy & Precautions, NPO status , Patient's Chart, lab work & pertinent test results, reviewed documented beta blocker date and time   Airway Mallampati: II  TM Distance: >3 FB Neck ROM: Full    Dental no notable dental hx. (+) Teeth Intact   Pulmonary Current Smoker and Patient abstained from smoking.,    Pulmonary exam normal breath sounds clear to auscultation       Cardiovascular hypertension, Pt. on medications and Pt. on home beta blockers Normal cardiovascular exam Rhythm:Regular Rate:Normal  Hx/o superior Mesenteric vein thrombosis 2019   Neuro/Psych PSYCHIATRIC DISORDERS Depression negative neurological ROS     GI/Hepatic GERD  Medicated and Controlled,(+)     substance abuse  marijuana use, Hx/o primary sclerosing cholangitis Appendicitis- acute   Endo/Other  Hypothyroidism Hyperthyroidism Hx/o Grave's disease Hyperlipidemia  Renal/GU   negative genitourinary   Musculoskeletal negative musculoskeletal ROS (+)   Abdominal (+) + obese,   Peds  Hematology  (+) Blood dyscrasia, , Protein S deficiency Eliquis therapy- last dose   Anesthesia Other Findings   Reproductive/Obstetrics                            Anesthesia Physical Anesthesia Plan  ASA: III  Anesthesia Plan: General   Post-op Pain Management:    Induction: Intravenous  PONV Risk Score and Plan: 3 and Ondansetron, Dexamethasone, Treatment may vary due to age or medical condition and Midazolam  Airway Management Planned: Oral ETT  Additional Equipment:   Intra-op Plan:   Post-operative Plan: Extubation in OR  Informed Consent: I have reviewed the patients History and Physical, chart, labs and discussed the procedure including the risks, benefits and alternatives for the proposed anesthesia with the patient  or authorized representative who has indicated his/her understanding and acceptance.     Dental advisory given  Plan Discussed with: CRNA, Anesthesiologist and Surgeon  Anesthesia Plan Comments:         Anesthesia Quick Evaluation

## 2020-05-11 NOTE — Op Note (Signed)
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    Procedure: Laparoscopic debridement of abscess containing yellow pus and thick fibrinous exudates Location Surgery:W L room 1 Complications: none  EBL:   minimal cc  Drains: none  Description of Procedure:  The patient was taken to OR 1 .  After anesthesia was administered and the patient was prepped  with chloroprep  and a timeout was performed.  Access to the abdomen was achieved to the left upper quadrant with a 5 mm Optiview.  It appeared that the patient has had a robotic cholecystectomy with a linear transverse incisions for the robot noted.  However there was no record of that in the chart.  The CT scan performed yesterday indicated there was acute appendicitis without evidence of perforation or abscess or anything abnormal.  This was somewhat in complete contrast to the actual surgical findings.  Upon entering the abdomen there is inflammatory mass stuck up to the urachus at and below the umbilicus.  This these masses of bowel loops for stuck together as well as stuck anteriorly.  I began with a general surgical scissor dissection to take these down off the anterior abdominal wall and swept this off and separated the bowel loops from the anterior abdominal wall.  I then worked my way over to the right lower quadrant and in so doing found this large pocket of pus ran from the umbilicus [name] to the right posterior elbow inferior lateral abdomen.  Structures were very distorted and I eventually found the cecum and and follow the tenia down to the end of the cecum and there found a linear structure connected to what appeared to be the phlegmon/abscess.  Adjacent to the phlegmon abscess was a sac filled  with yellow pus with shaggy exudate and thick exudate on it consistent with a chronic perforated appendix.  However I did submit this linear structure for pathologic examination but it may well prove to be wrong.  As I said this these were chronic changes with marked distortion of the anatomy.  I did remove the structures of mentioned above and I did drain the pus and would count those as positives in his operation.  However there seem to be some involvement of the bladder and this phlegmon and there are now interloop small bowel loops that may have adhesions that I was not able to separate.  This was done with through 3 trochars I did introduce a 12 through the umbilical port and a oblique fashion and through that I introduced the stapler with a vascular load twice to amputate this tubular structure which I was treating as the appendix.  When the operation was completed we would decompress the abdomen and remove the trochars after injecting the sites with some Exparel.  Wounds were approximated with 4-0 Monocryl and with Dermabond.  The patient tolerated the procedure well and was taken to the PACU in stable condition.     Matt B. Hassell Done, [place], [facility], [place]

## 2020-05-11 NOTE — H&P (View-Only) (Signed)
    CC: abdominal pain  Subjective: Patient is doing well this AM.  He still tender in the right lower quadrant.  Objective: Vital signs in last 24 hours: Temp:  [98.1 F (36.7 C)-99.5 F (37.5 C)] 98.9 F (37.2 C) (07/27 0606) Pulse Rate:  [53-91] 89 (07/27 0606) Resp:  [15-22] 16 (07/27 0606) BP: (91-170)/(65-90) 156/82 (07/27 0606) SpO2:  [95 %-100 %] 100 % (07/27 0606) Last BM Date: 05/08/20 NPo 1930 IV Urine x 1 Afebrile, VSS K+ 3.1 WBC 8.4  Intake/Output from previous day: 07/26 0701 - 07/27 0700 In: 1930.1 [I.V.:526.6; IV Piggyback:1403.5] Out: 0  Intake/Output this shift: No intake/output data recorded.  General appearance: alert, cooperative and no distress Resp: clear to auscultation bilaterally Cardio: regular rate and rhythm, S1, S2 normal, no murmur, click, rub or gallop GI: Soft, tender in the right lower quadrant.  Lab Results:  Recent Labs    05/10/20 0149 05/11/20 0041  WBC 16.7* 8.4  HGB 14.7 14.3  HCT 44.2 43.0  PLT 204 103*    BMET Recent Labs    05/10/20 0149 05/11/20 0041  NA 137 139  K 3.1* 3.1*  CL 101 103  CO2 21* 24  GLUCOSE 158* 122*  BUN 13 9  CREATININE 1.29* 1.01  CALCIUM 9.7 9.0   PT/INR No results for input(s): LABPROT, INR in the last 72 hours.  Recent Labs  Lab 05/05/20 1614 05/10/20 0149  AST 20 27  ALT 19 22  ALKPHOS  --  46  BILITOT 0.9 1.0  PROT 8.3* 8.7*  ALBUMIN  --  5.4*     Lipase     Component Value Date/Time   LIPASE 26 05/10/2020 0149     Medications: . gabapentin  300 mg Oral On Call to OR  . [START ON 05/12/2020] levothyroxine  125 mcg Oral Q0600  . 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 His last dose was yesterday, he is unsure if it was in the AM or PM. Will plan for Laparoscopic Appendectomy later this week after the Eliquis has worn off.   FEN - NPO, IVF VTE - SCDs, please hold Eliquis - Heparin on hold ID - Rocephin/Flagyl   Plan: OR later today.       LOS: 1 day    Hermen Mario 05/11/2020 Please see Amion

## 2020-05-11 NOTE — Interval H&P Note (Signed)
History and Physical Interval Note:  05/11/2020 10:52 AM  Kyle Gonzales  has presented today for surgery, with the diagnosis of appendicitis.  The various methods of treatment have been discussed with the patient and family. After consideration of risks, benefits and other options for treatment, the patient has consented to  Procedure(s): APPENDECTOMY LAPAROSCOPIC (N/A) as a surgical intervention.  The patient's history has been reviewed, patient examined, no change in status, stable for surgery.  I have reviewed the patient's chart and labs.  Questions were answered to the patient's satisfaction.     Pedro Earls

## 2020-05-11 NOTE — Transfer of Care (Signed)
Immediate Anesthesia Transfer of Care Note  Patient: Kyle Gonzales  Procedure(s) Performed: APPENDECTOMY LAPAROSCOPIC (N/A Abdomen)  Patient Location: PACU  Anesthesia Type:General  Level of Consciousness: drowsy  Airway & Oxygen Therapy: Patient Spontanous Breathing and Patient connected to face mask oxygen  Post-op Assessment: Report given to RN and Post -op Vital signs reviewed and stable  Post vital signs: Reviewed and stable  Last Vitals:  Vitals Value Taken Time  BP 190/90 05/11/20 1440  Temp    Pulse 104 05/11/20 1441  Resp 18 05/11/20 1441  SpO2 98 % 05/11/20 1441  Vitals shown include unvalidated device data.  Last Pain:  Vitals:   05/11/20 1053  TempSrc:   PainSc: 0-No pain      Patients Stated Pain Goal: 4 (16/10/96 0454)  Complications: No complications documented.

## 2020-05-11 NOTE — Anesthesia Procedure Notes (Signed)
Procedure Name: Intubation Date/Time: 05/11/2020 11:49 AM Performed by: Sharlette Dense, CRNA Patient Re-evaluated:Patient Re-evaluated prior to induction Oxygen Delivery Method: Circle system utilized Preoxygenation: Pre-oxygenation with 100% oxygen Induction Type: IV induction Ventilation: Mask ventilation without difficulty and Oral airway inserted - appropriate to patient size Laryngoscope Size: Miller and 3 Grade View: Grade I Tube type: Oral Tube size: 8.0 mm Number of attempts: 1 Airway Equipment and Method: Stylet Placement Confirmation: ETT inserted through vocal cords under direct vision,  positive ETCO2 and breath sounds checked- equal and bilateral Secured at: 23 cm Tube secured with: Tape Dental Injury: Teeth and Oropharynx as per pre-operative assessment

## 2020-05-11 NOTE — Progress Notes (Signed)
    CC: abdominal pain  Subjective: Patient is doing well this AM.  He still tender in the right lower quadrant.  Objective: Vital signs in last 24 hours: Temp:  [98.1 F (36.7 C)-99.5 F (37.5 C)] 98.9 F (37.2 C) (07/27 0606) Pulse Rate:  [53-91] 89 (07/27 0606) Resp:  [15-22] 16 (07/27 0606) BP: (91-170)/(65-90) 156/82 (07/27 0606) SpO2:  [95 %-100 %] 100 % (07/27 0606) Last BM Date: 05/08/20 NPo 1930 IV Urine x 1 Afebrile, VSS K+ 3.1 WBC 8.4  Intake/Output from previous day: 07/26 0701 - 07/27 0700 In: 1930.1 [I.V.:526.6; IV Piggyback:1403.5] Out: 0  Intake/Output this shift: No intake/output data recorded.  General appearance: alert, cooperative and no distress Resp: clear to auscultation bilaterally Cardio: regular rate and rhythm, S1, S2 normal, no murmur, click, rub or gallop GI: Soft, tender in the right lower quadrant.  Lab Results:  Recent Labs    05/10/20 0149 05/11/20 0041  WBC 16.7* 8.4  HGB 14.7 14.3  HCT 44.2 43.0  PLT 204 103*    BMET Recent Labs    05/10/20 0149 05/11/20 0041  NA 137 139  K 3.1* 3.1*  CL 101 103  CO2 21* 24  GLUCOSE 158* 122*  BUN 13 9  CREATININE 1.29* 1.01  CALCIUM 9.7 9.0   PT/INR No results for input(s): LABPROT, INR in the last 72 hours.  Recent Labs  Lab 05/05/20 1614 05/10/20 0149  AST 20 27  ALT 19 22  ALKPHOS  --  46  BILITOT 0.9 1.0  PROT 8.3* 8.7*  ALBUMIN  --  5.4*     Lipase     Component Value Date/Time   LIPASE 26 05/10/2020 0149     Medications: . gabapentin  300 mg Oral On Call to OR  . [START ON 05/12/2020] levothyroxine  125 mcg Oral Q0600  . 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 His last dose was yesterday, he is unsure if it was in the AM or PM. Will plan for Laparoscopic Appendectomy later this week after the Eliquis has worn off.   FEN - NPO, IVF VTE - SCDs, please hold Eliquis - Heparin on hold ID - Rocephin/Flagyl   Plan: OR later today.       LOS: 1 day    Kyle Gonzales 05/11/2020 Please see Amion

## 2020-05-11 NOTE — Progress Notes (Addendum)
Bear for IV heparin - per [facility], resume IV heparin 6 hours after surgery 7/27 with no bolus Indication: protein S deficiency, SMV thrombus (bridging while off Eliquis)  Allergies  Allergen Reactions   Tylenol [Acetaminophen] Other (See Comments)    r/t elevated LFTs     Patient Measurements: Height: 6\' 3"  (190.5 cm) Weight: (!) 110.3 kg (243 lb 3.2 oz) IBW/kg (Calculated) : 84.5 Heparin Dosing Weight: 107 kg  Vital Signs: Temp: 98.7 F (37.1 C) (07/27 1555) Temp Source: Oral (07/27 1555) BP: 156/119 (07/27 1555) Pulse Rate: 104 (07/27 1555)  Labs: Recent Labs    05/10/20 0149 05/10/20 1707 05/11/20 0041  HGB 14.7  --  14.3  HCT 44.2  --  43.0  PLT 204  --  103*  APTT  --  23* 117*  HEPARINUNFRC  --  0.46  --   CREATININE 1.29*  --  1.01    Estimated Creatinine Clearance: 147.3 mL/min (by C-G formula based on SCr of 1.01 mg/dL).   Medical History: Past Medical History:  Diagnosis Date   GERD (gastroesophageal reflux disease)    Graves disease 08/18/2015   Hyperlipidemia    Hypothyroid    Palpitations    PSC (primary sclerosing cholangitis)    Thrombosis    superior mesenteric vein - 2019   Vitamin D deficiency     Medications:  Medications Prior to Admission  Medication Sig Dispense Refill Last Dose   alum & mag hydroxide-simeth (MAALOX ADVANCED MAX ST) 400-400-40 MG/5ML suspension Take 15 mLs by mouth every 6 (six) hours as needed for indigestion. 355 mL 0 Past Month at Unknown time   apixaban (ELIQUIS) 5 MG TABS tablet TAKE 1 TABLET(5 MG) BY MOUTH TWICE DAILY (Patient taking differently: Take 5 mg by mouth 2 (two) times daily. ) 180 tablet 0 05/09/2020 at unk to mother    bisoprolol-hydrochlorothiazide [facility]) 10-6.25 MG tablet Take 1 tablet Daily for BP (Patient taking differently: Take 1 tablet by mouth daily. ) 90 tablet 0 05/09/2020 at Unknown time   busPIRone (BUSPAR) 10 MG tablet Take 1 tablet 3 x  /day for Anxiety (Patient taking differently: Take 10 mg by mouth 3 (three) times daily. ) 270 tablet 1 05/09/2020 at Unknown time   Cholecalciferol (VITAMIN D3) 5000 units CAPS Take 1 capsule (5,000 Units total) by mouth every other day. (Patient taking differently: Take 5,000 Units by mouth daily. )   05/09/2020 at Unknown time   escitalopram (LEXAPRO) 20 MG tablet Take 1 tablet Daily for Mood, Anxiety, Irritability  & Depression (Patient taking differently: Take 20 mg by mouth daily. ) 30 tablet 0 05/09/2020 at Unknown time   ezetimibe (ZETIA) 10 MG tablet Take 1 tablet Daily for Cholesterol (Patient taking differently: Take 10 mg by mouth daily. ) 90 tablet 0 05/09/2020 at Unknown time   levothyroxine (SYNTHROID) 100 MCG tablet Take 1 tablet daily on an empty stomach with only water for 30 minutes & no Antacid meds, Calcium or Magnesium for 4 hours & avoid Biotin (Patient taking differently: Take 100 mcg by mouth daily before breakfast. ) 90 tablet 0 05/09/2020 at Unknown time   omeprazole (PRILOSEC) 40 MG capsule TAKE ONE CAPSULE BY MOUTH EVERY MORNING BEFORE BREAKFAST (Patient taking differently: Take 40 mg by mouth daily. ) 90 capsule 1 05/09/2020 at Unknown time   ondansetron (ZOFRAN) 8 MG tablet Take 1 tablet 3 x /day . . . ONLY . Marland Kitchen .  if needed for Nausea (  Patient taking differently: Take 8 mg by mouth daily. ) 60 tablet 0 05/09/2020 at Unknown time   Scheduled:   Infusions:   dextrose 5 % and 0.45 % NaCl with KCl 20 mEq/L     PRN:   Assessment: 48 yoM with PMH of SMV thrombus and protein S deficiency on Eliquis PTA, admitted 7/26 for abdominal pain. Found to have acute appendicitis and Eliquis held to allow for appendectomy. Pharmacy to bridge with heparin perioperatively.    Baseline heparin level slightly elevated at 0.46 as anticipated due to lab-drug interaction with apixaban, aPTT WNL at 23  Prior anticoagulation: Eliquis 5 mg PO bid, last dose 7/25 AM  Significant  events:  Today, 05/11/2020:  Aptt above goal (117sec) on 1500 units/hr this AM at 0041 prior to stopping the IV Heparin for surgery today  CBC: Hg WNL, Pltc 204>>103  Fairly rapid decrease in pltc however patient's baseline appears to be 130s-150s.  He was on IV heparin infusion 7/15>>7/17 so could see heparin-induced thrombocytopenia sooner than typical timeframe of 5-7 days after initiation.  Goal of Therapy: Heparin level 0.3-0.7 units/ml Monitor platelets by anticoagulation protocol: Yes  Plan:  Per [facility] order, plan to restart IV Heparin without a bolus 6 hours after appendectomy ended today at 1431  Due to elevated aPTT this AM, will restart IV at a reduced rate of 1300 units/hr (note patient was on 1500 units/hr previously) at 2030  Check aPTT with AM labs  Daily CBC and heparin level  Monitor for signs of bleeding or thrombosis   Adrian Saran, PharmD, BCPS 05/11/2020 4:12 PM

## 2020-05-11 NOTE — Progress Notes (Signed)
Pharmacy Antibiotic Note  Robertson Colclough is a 29 y.o. male admitted on 05/10/2020 with post op appendectomy.  Pharmacy has been consulted for Zosyn dosing.  Plan: Zosyn 3.375g IV q8 (extended interval infusion) Will sign off  Height: 6\' 3"  (190.5 cm) Weight: (!) 110.3 kg (243 lb 3.2 oz) IBW/kg (Calculated) : 84.5  Temp (24hrs), Avg:98.9 F (37.2 C), Min:98.3 F (36.8 C), Max:99.5 F (37.5 C)  Recent Labs  Lab 05/05/20 1614 05/10/20 0149 05/11/20 0041  WBC 6.3 16.7* 8.4  CREATININE 1.14 1.29* 1.01    Estimated Creatinine Clearance: 147.3 mL/min (by C-G formula based on SCr of 1.01 mg/dL).    Allergies  Allergen Reactions  . Tylenol [Acetaminophen] Other (See Comments)    r/t elevated LFTs       Thank you for allowing pharmacy to be a part of this patient's care.  Kara Mead 05/11/2020 4:24 PM

## 2020-05-11 NOTE — Anesthesia Postprocedure Evaluation (Signed)
Anesthesia Post Note  Patient: Kyle Gonzales  Procedure(s) Performed: APPENDECTOMY LAPAROSCOPIC (N/A Abdomen)     Patient location during evaluation: PACU Anesthesia Type: General Level of consciousness: awake and alert and oriented Pain management: pain level controlled Vital Signs Assessment: post-procedure vital signs reviewed and stable Respiratory status: spontaneous breathing, nonlabored ventilation and respiratory function stable Cardiovascular status: blood pressure returned to baseline and stable Postop Assessment: no apparent nausea or vomiting Anesthetic complications: no   No complications documented.  Last Vitals:  Vitals:   05/11/20 1555 05/11/20 1703  BP: (!) 156/119 (!) 163/76  Pulse: 104 65  Resp: 16 18  Temp: 37.1 C (!) 36.4 C  SpO2: 95% 97%    Last Pain:  Vitals:   05/11/20 1703  TempSrc: Oral  PainSc:                  Stacie Knutzen A.

## 2020-05-11 NOTE — Progress Notes (Signed)
[facility] PROGRESS NOTE   Kyle Gonzales WNI:[id number] DOB: [birth year] DOA: 05/10/2020  PCP: [name], MD  Brief History/Interval Summary: 29 year old male with past medical history of primary sclerosing cholangitis follows with Dr. 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  CT abdomen pelvis at [facility] showed acute nonperforated appendicitis.  He was given ceftriaxone and Flagyl transferred to [facility] after ED physician had discussion with Dr. Hassell Done, general surgery on-call.  Patient was admitted.  His Eliquis was held.  Reason for Visit: Acute appendicitis.  Consultants: General surgery  Procedures: Appendectomy planned for today  Antibiotics: Anti-infectives (From admission, onward)   Start     Dose/Rate Route Frequency Ordered Stop   05/11/20 1000  [MAR Hold]  cefTRIAXone (ROCEPHIN) 2 g in sodium chloride 0.9 % 100 mL IVPB     Discontinue     (MAR Hold since Tue 05/11/2020 at 1032.Hold Reason: Transfer to a Procedural area.)  "And" Linked Group Details   2 g 200 mL/hr over 30 Minutes Intravenous Every 24 hours 05/10/20 1350     05/10/20 1800  [MAR Hold]  metroNIDAZOLE (FLAGYL) IVPB 500 mg     Discontinue     (MAR Hold since Tue 05/11/2020 at 1032.Hold Reason: Transfer to a Procedural area.)  "And" Linked Group Details   500 mg 100 mL/hr over 60 Minutes Intravenous Every 8 hours 05/10/20 1350     05/10/20 0915  cefTRIAXone (ROCEPHIN) 2 g in sodium chloride 0.9 % 100 mL IVPB       "And" Linked Group Details   2 g 200 mL/hr over 30 Minutes Intravenous  Once 05/10/20 0902 05/10/20 1223   05/10/20 0915  metroNIDAZOLE (FLAGYL) IVPB 500 mg       "And"  Linked Group Details   500 mg 100 mL/hr over 60 Minutes Intravenous  Once 05/10/20 0902 05/10/20 1115      Subjective/Interval History: Patient complains of 8 out of 10 pain in his lower abdomen more so on the right side.  Denies any nausea vomiting currently.  No chest pain or shortness of breath.  His mother is at the bedside.    Assessment/Plan:  Acute appendicitis without perforation Currently on ceftriaxone and metronidazole.  Management per general surgery.  It appears the plan is for surgery later today.  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  Held due to need for surgery.  Was also started on a heparin bridge which was turned off this morning so that patient could undergo surgery this afternoon.  Resume heparin per general surgery.  And then subsequently resumed Eliquis when okay with general surgery.  Essential hypertension High blood pressure readings most likely due to pain.  Currently on metoprolol as needed.  At home he was on bisoprolol/HCTZ which is held for now.  Mild acute kidney injury/hypokalemia Creatinine was 1.29 at admission.  Improved to 1.01 today.  Continue IV hydration.  Replace potassium intravenously.  Magnesium 1.7.  History of Graves' disease status post RAI ablation now with acquired hypothyroidism TSH 0.45 on 7/21.  Check free T4.  May need to go up on the dose of his levothyroxine.  It looks like it has been increased to  125 mcg at the time of admission.  Hyperlipidemia Holding home medications.  Thrombocytopenia Low platelet counts noted this morning.  Recheck it tomorrow.  History of primary sclerosing cholangitis Chronic changes noted on CT scan.  Patient is followed by [facility].  Obesity Estimated body mass index is 30.4 kg/m as calculated from the following:   Height as of this encounter: 6\' 3"  (1.905 m).   Weight as of this encounter: 110.3 kg.    DVT Prophylaxis:  On IV heparin bridge, held for surgery Code Status: Full code Family Communication: Discussed with patient and his mother Disposition Plan: Hopefully return home when improved  Status is: Inpatient  Remains inpatient appropriate because:Ongoing active pain requiring inpatient pain management, Ongoing diagnostic testing needed not appropriate for outpatient work up and Inpatient level of care appropriate due to severity of illness   Dispo: The patient is from: Home              Anticipated d/c is to: Home              Anticipated d/c date is: 2 days              Patient currently is not medically stable to d/c.      Medications:  Scheduled: . bupivacaine liposome  20 mL Infiltration Once  . [MAR Hold] levothyroxine  125 mcg Oral Q0600  . scopolamine  1 patch Transdermal On Call to OR   Continuous: . [MAR Hold] cefTRIAXone (ROCEPHIN)  IV     And  . [MAR Hold] metronidazole 500 mg (05/11/20 0115)  . lactated ringers with kcl Stopped (05/11/20 1043)  . lactated ringers 10 mL/hr at 05/11/20 1044  . [MAR Hold] potassium chloride 50 mL/hr at 05/11/20 1053   PRN:[MAR Hold] metoprolol tartrate, [MAR Hold]  morphine injection, [MAR Hold] ondansetron **OR** [MAR Hold] ondansetron (ZOFRAN) IV, [MAR Hold] oxyCODONE   Objective:  Vital Signs  Vitals:   05/10/20 2111 05/11/20 0127 05/11/20 0606 05/11/20 1039  BP: (!) 147/90 (!) 133/72 (!) 156/82 (!) 172/79  Pulse: 91 68 89 90  Resp: 16 16 16 17   Temp: 99.5 F (37.5 C) 98.8 F (37.1 C) 98.9 F (37.2 C) 98.9 F (37.2 C)  TempSrc: Oral Oral Oral Oral  SpO2: 100% 98% 100% 99%  Weight:      Height:        Intake/Output Summary (Last 24 hours) at 05/11/2020 1137 Last data filed at 05/11/2020 0741 Gross per 24 hour  Intake 826.6 ml  Output 0 ml  Net 826.6 ml   Filed Weights   05/10/20 0716  Weight: (!) 110.3 kg    General appearance: Awake alert.  In no distress Resp: Clear to auscultation bilaterally.  Normal  effort Cardio: S1-S2 is normal regular.  No S3-S4.  No rubs murmurs or bruit GI: Abdomen is soft.  Tender in the lower abdomen especially on the right.  No rigidity or guarding.   Extremities: No edema.  Full range of motion of lower extremities. Neurologic: Alert and oriented x3.  No focal neurological deficits.    Lab Results:  Data Reviewed: I have personally reviewed following labs and imaging studies  CBC: Recent Labs  Lab 05/05/20 1614 05/10/20 0149 05/11/20 0041  WBC 6.3 16.7* 8.4  NEUTROABS 4,990  --   --   HGB 14.9 14.7 14.3  HCT 44.5 44.2 43.0  MCV 91.6 91.5 92.5  PLT 135* 204 103*    Basic Metabolic Panel: Recent Labs  Lab 05/05/20 1614 05/10/20 0149 05/11/20 0041  NA 140 137 139  K 4.0 3.1* 3.1*  CL 106 101 103  CO2 25 21* 24  GLUCOSE 97 158* 122*  BUN 13 13 9   CREATININE 1.14 1.29* 1.01  CALCIUM 10.0 9.7 9.0  MG 2.1  --  1.7    GFR: Estimated Creatinine Clearance: 147.3 mL/min (by C-G formula based on SCr of 1.01 mg/dL).  Liver Function Tests: Recent Labs  Lab 05/05/20 1614 05/10/20 0149  AST 20 27  ALT 19 22  ALKPHOS  --  46  BILITOT 0.9 1.0  PROT 8.3* 8.7*  ALBUMIN  --  5.4*    Recent Labs  Lab 05/10/20 0149  LIPASE 26     Recent Results (from the past 240 hour(s))  SARS Coronavirus 2 by RT PCR (hospital order, performed in [facility] hospital lab) Nasopharyngeal Nasopharyngeal Swab     Status: None   Collection Time: 05/10/20  9:31 AM   Specimen: Nasopharyngeal Swab  Result Value Ref Range Status   SARS Coronavirus 2 NEGATIVE NEGATIVE Final    Comment: (NOTE) SARS-CoV-2 target nucleic acids are NOT DETECTED.  The SARS-CoV-2 RNA is generally detectable in upper and lower respiratory specimens during the acute phase of infection. The lowest concentration of SARS-CoV-2 viral copies this assay can detect is 250 copies / mL. A negative result does not preclude SARS-CoV-2 infection and should not be used as the sole basis for  treatment or other patient management decisions.  A negative result may occur with improper specimen collection / handling, submission of specimen other than nasopharyngeal swab, presence of viral mutation(s) within the areas targeted by this assay, and inadequate number of viral copies (<250 copies / mL). A negative result must be combined with clinical observations, patient history, and epidemiological information.  Fact Sheet for Patients:   StrictlyIdeas.no  Fact Sheet for Healthcare Providers: BankingDealers.co.za  This test is not yet approved or  cleared by the [place] FDA and has been authorized for detection and/or diagnosis of SARS-CoV-2 by FDA under an Emergency Use Authorization (EUA).  This EUA will remain in effect (meaning this test can be used) for the duration of the COVID-19 declaration under Section 564(b)(1) of the Act, 21 U.S.C. section 360bbb-3(b)(1), unless the authorization is terminated or revoked sooner.  Performed at [facility], [address] [place], [place] [address]   Surgical pcr screen     Status: None   Collection Time: 05/11/20  1:22 AM   Specimen: Nasal Mucosa; Nasal Swab  Result Value Ref Range Status   MRSA, PCR NEGATIVE NEGATIVE Final   Staphylococcus aureus NEGATIVE NEGATIVE Final    Comment: (NOTE) The Xpert SA Assay (FDA approved for NASAL specimens in patients 38 years of age and older), is one component of a comprehensive surveillance program. It is not intended to diagnose infection nor to guide or monitor treatment. Performed at [facility], [place] [address], [place], [place] [address]       Radiology Studies: CT Abdomen Pelvis W Contrast  Result Date: 05/10/2020 CLINICAL DATA:  29 year old male with history of acute onset of left lower quadrant tenderness and left-sided flank pain. Leukocytosis. History of primary sclerosing cholangitis. EXAM:  CT ABDOMEN AND PELVIS WITH CONTRAST TECHNIQUE: Multidetector CT imaging of the abdomen and pelvis was performed using the standard protocol following bolus administration of intravenous contrast. CONTRAST:  163mL OMNIPAQUE IOHEXOL 300 MG/ML  SOLN COMPARISON:  CT the abdomen  and pelvis 07/05/2018. FINDINGS: Lower chest: Unremarkable. Hepatobiliary: Liver has a irregular contour, and there is moderate intrahepatic biliary ductal dilatation, increased slightly compared to prior studies, compatible with reported clinical history of primary sclerosing cholangitis. In segment 7 of the liver there is a 2.6 x 2.0 cm intermediate attenuation lesion (axial image 22 of series 2). In segment 5 of the liver there is a 1.5 x 1.4 cm intermediate attenuation lesion (axial image 30 of series 2). Both of these lesions were previously characterized as cavernous hemangiomas on prior abdominal MRI 08/16/2018. No other suspicious hepatic lesions. Status post cholecystectomy. Cavernous transformation in the porta hepatis. Common bile duct is normal in caliber. Pancreas: No pancreatic mass. No pancreatic ductal dilatation. No pancreatic or peripancreatic fluid collections or inflammatory changes. Spleen: Unremarkable. Adrenals/Urinary Tract: Bilateral kidneys and adrenal glands are normal in appearance. No hydroureteronephrosis. Urinary bladder is normal in appearance. Stomach/Bowel: Normal appearance of the stomach. No pathologic dilatation of small bowel or colon. The appendix is dilated and inflamed, concerning for an acute appendicitis. Appendix: Location: Low anatomic pelvis Diameter: 2.0 cm Appendicolith: None Mucosal hyper-enhancement: Present Extraluminal gas: None Periappendiceal collection: None Vascular/Lymphatic: No significant atherosclerotic disease, aneurysm or dissection noted in the abdominal or pelvic vasculature. 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 No  lymphadenopathy noted in the abdomen or pelvis. Reproductive: Prostate gland and seminal vesicles are unremarkable in appearance. Other: No significant volume of ascites.  No pneumoperitoneum. Musculoskeletal: There are no aggressive appearing lytic or blastic lesions noted in the visualized portions of the skeleton. IMPRESSION: 1. Findings are compatible with acute appendicitis. No periappendiceal abscess or signs of frank perforation are noted at this time. Surgical consultation is recommended. 2. Chronic changes related to primary sclerosing cholangitis redemonstrated with increased intrahepatic biliary ductal dilatation, as above. 3. 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 4. 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 Electronically Signed   By: Vinnie Langton M.D.   On: 05/10/2020 08:33       LOS: 1 day   [facility] Pager on [email or web address]  05/11/2020, 11:37 AM

## 2020-05-11 NOTE — Discharge Instructions (Signed)
[facility] ______[facility] °LAPAROSCOPIC SURGERY: POST OP INSTRUCTIONS °Always review your discharge instruction sheet given to you by the facility where your surgery was performed. °IF YOU HAVE DISABILITY OR FAMILY LEAVE FORMS, YOU MUST BRING THEM TO THE OFFICE FOR PROCESSING.   °DO NOT GIVE THEM TO YOUR DOCTOR. ° °1. A prescription for pain medication may be given to you upon discharge.  Take your pain medication as prescribed, if needed.  If narcotic pain medicine is not needed, then you may take acetaminophen (Tylenol) or ibuprofen (Advil) as needed. °2. Take your usually prescribed medications unless otherwise directed. °3. If you need a refill on your pain medication, please contact your pharmacy.  They will contact our office to request authorization. Prescriptions will not be filled after 5pm or on week-ends. °4. You should follow a light diet the first few days after arrival home, such as soup and crackers, etc.  Be sure to include lots of fluids daily. °5. Most patients will experience some swelling and bruising in the area of the incisions.  Ice packs will help.  Swelling and bruising can take several days to resolve.  °6. It is common to experience some constipation if taking pain medication after surgery.  Increasing fluid intake and taking a stool softener (such as Colace) will usually help or prevent this problem from occurring.  A mild laxative (Milk of Magnesia or Miralax) should be taken according to package instructions if there are no bowel movements after 48 hours. °7. Unless discharge instructions indicate otherwise, you may remove your bandages 24-48 hours after surgery, and you may shower at that time.  You may have steri-strips (small skin tapes) in place directly over the incision.  These strips should be left on the skin for 7-10 days.  If your surgeon used skin glue on the incision, you may shower in 24 hours.  The glue will flake off over the next 2-3 weeks.  Any sutures or  staples will be removed at the office during your follow-up visit. °8. ACTIVITIES:  You may resume regular (light) daily activities beginning the next day--such as daily self-care, walking, climbing stairs--gradually increasing activities as tolerated.  You may have sexual intercourse when it is comfortable.  Refrain from any heavy lifting or straining until approved by your doctor. °a. You may drive when you are no longer taking prescription pain medication, you can comfortably wear a seatbelt, and you can safely maneuver your car and apply brakes. °b. RETURN TO WORK:  __________________________________________________________ °9. You should see your doctor in the office for a follow-up appointment approximately 2-3 weeks after your surgery.  Make sure that you call for this appointment within a day or two after you arrive home to insure a convenient appointment time. °10. OTHER INSTRUCTIONS: __________________________________________________________________________________________________________________________ __________________________________________________________________________________________________________________________ °WHEN TO CALL YOUR DOCTOR: °1. Fever over 101.0 °2. Inability to urinate °3. Continued bleeding from incision. °4. Increased pain, redness, or drainage from the incision. °5. Increasing abdominal pain ° °The clinic staff is available to answer your questions during regular business hours.  Please don’t hesitate to call and ask to speak to one of the nurses for clinical concerns.  If you have a medical emergency, go to the nearest emergency room or call 911.  A surgeon from [facility] is always on call at the hospital. °[address], [address], [place], [place]  [address] ? [address] [address], [place], [place]   [address] °[phone] ? [phone] ? FAX [phone] °Web site:   [email or web address] °

## 2020-05-12 ENCOUNTER — Inpatient Hospital Stay (HOSPITAL_COMMUNITY): Payer: Self-pay

## 2020-05-12 ENCOUNTER — Encounter (HOSPITAL_COMMUNITY): Payer: Self-pay | Admitting: Surgery

## 2020-05-12 DIAGNOSIS — K352 Acute appendicitis with generalized peritonitis, without abscess: Secondary | ICD-10-CM

## 2020-05-12 DIAGNOSIS — R945 Abnormal results of liver function studies: Secondary | ICD-10-CM

## 2020-05-12 DIAGNOSIS — K353 Acute appendicitis with localized peritonitis, without perforation or gangrene: Secondary | ICD-10-CM

## 2020-05-12 LAB — CBC
HCT: 39.6 % (ref 39.0–52.0)
Hemoglobin: 13.1 g/dL (ref 13.0–17.0)
MCH: 30.5 pg (ref 26.0–34.0)
MCHC: 33.1 g/dL (ref 30.0–36.0)
MCV: 92.3 fL (ref 80.0–100.0)
Platelets: 81 10*3/uL — ABNORMAL LOW (ref 150–400)
RBC: 4.29 MIL/uL (ref 4.22–5.81)
RDW: 13.6 % (ref 11.5–15.5)
WBC: 6.7 10*3/uL (ref 4.0–10.5)
nRBC: 0 % (ref 0.0–0.2)

## 2020-05-12 LAB — BASIC METABOLIC PANEL
Anion gap: 12 (ref 5–15)
BUN: 7 mg/dL (ref 6–20)
CO2: 25 mmol/L (ref 22–32)
Calcium: 8.9 mg/dL (ref 8.9–10.3)
Chloride: 100 mmol/L (ref 98–111)
Creatinine, Ser: 0.86 mg/dL (ref 0.61–1.24)
GFR calc Af Amer: >60 mL/min (ref >60)
GFR calc non Af Amer: >60 mL/min (ref >60)
Glucose, Bld: 147 mg/dL — ABNORMAL HIGH (ref 70–99)
Potassium: 3.1 mmol/L — ABNORMAL LOW (ref 3.5–5.1)
Sodium: 137 mmol/L (ref 135–145)

## 2020-05-12 LAB — MAGNESIUM: Magnesium: 1.9 mg/dL (ref 1.7–2.4)

## 2020-05-12 LAB — HEPARIN LEVEL (UNFRACTIONATED)
Heparin Unfractionated: 0.27 IU/mL — ABNORMAL LOW (ref 0.30–0.70)
Heparin Unfractionated: 0.22 IU/mL — ABNORMAL LOW (ref 0.30–0.70)
Heparin Unfractionated: 0.28 IU/mL — ABNORMAL LOW (ref 0.30–0.70)

## 2020-05-12 LAB — SURGICAL PATHOLOGY

## 2020-05-12 LAB — APTT: aPTT: 64 seconds — ABNORMAL HIGH (ref 24–36)

## 2020-05-12 MED ORDER — PROCHLORPERAZINE EDISYLATE 10 MG/2ML IJ SOLN
10.0000 mg | Freq: Four times a day (QID) | INTRAMUSCULAR | Status: DC | PRN
  Administered 2020-05-12 – 2020-05-16 (×4): 10 mg via INTRAVENOUS
  Filled 2020-05-12 (×6): qty 2

## 2020-05-12 MED ORDER — METHOCARBAMOL 1000 MG/10ML IJ SOLN
500.0000 mg | Freq: Three times a day (TID) | INTRAVENOUS | Status: DC | PRN
  Administered 2020-05-12: 500 mg via INTRAVENOUS
  Filled 2020-05-12: qty 500
  Filled 2020-05-12: qty 5

## 2020-05-12 MED ORDER — METOPROLOL TARTRATE 5 MG/5ML IV SOLN
2.5000 mg | Freq: Four times a day (QID) | INTRAVENOUS | Status: DC
  Administered 2020-05-12 – 2020-05-13 (×4): 2.5 mg via INTRAVENOUS
  Filled 2020-05-12 (×4): qty 5

## 2020-05-12 MED ORDER — MENTHOL 3 MG MT LOZG
1.0000 | LOZENGE | OROMUCOSAL | Status: DC | PRN
  Filled 2020-05-12: qty 9

## 2020-05-12 MED ORDER — HEPARIN (PORCINE) 25000 UT/250ML-% IV SOLN
1800.0000 [IU]/h | INTRAVENOUS | Status: DC
  Filled 2020-05-12: qty 250

## 2020-05-12 MED ORDER — HEPARIN (PORCINE) 25000 UT/250ML-% IV SOLN
1400.0000 [IU]/h | INTRAVENOUS | Status: DC
  Filled 2020-05-12: qty 250

## 2020-05-12 MED ORDER — PHENOL 1.4 % MT LIQD
1.0000 | OROMUCOSAL | Status: DC | PRN
  Filled 2020-05-12: qty 177

## 2020-05-12 MED ORDER — HEPARIN (PORCINE) 25000 UT/250ML-% IV SOLN
1600.0000 [IU]/h | INTRAVENOUS | Status: DC
  Administered 2020-05-12: 1600 [IU]/h via INTRAVENOUS
  Filled 2020-05-12 (×3): qty 250

## 2020-05-12 MED ORDER — POTASSIUM CHLORIDE 10 MEQ/100ML IV SOLN
10.0000 meq | INTRAVENOUS | Status: AC
  Administered 2020-05-12 – 2020-05-13 (×2): 10 meq via INTRAVENOUS
  Filled 2020-05-12: qty 100

## 2020-05-12 MED ORDER — POTASSIUM CHLORIDE 10 MEQ/100ML IV SOLN
INTRAVENOUS | Status: AC
  Administered 2020-05-12: 10 meq via INTRAVENOUS
  Filled 2020-05-12: qty 100

## 2020-05-12 MED ORDER — POTASSIUM CHLORIDE 10 MEQ/100ML IV SOLN
10.0000 meq | INTRAVENOUS | Status: DC
  Filled 2020-05-12: qty 100

## 2020-05-12 MED ORDER — BUSPIRONE HCL 5 MG PO TABS
10.0000 mg | ORAL_TABLET | Freq: Three times a day (TID) | ORAL | Status: DC
  Administered 2020-05-12 – 2020-05-18 (×16): 10 mg via ORAL
  Filled 2020-05-12 (×19): qty 2

## 2020-05-12 MED ORDER — HYDRALAZINE HCL 20 MG/ML IJ SOLN
10.0000 mg | Freq: Four times a day (QID) | INTRAMUSCULAR | Status: DC | PRN
  Administered 2020-05-16 – 2020-05-17 (×3): 10 mg via INTRAVENOUS
  Filled 2020-05-12 (×4): qty 1

## 2020-05-12 MED ORDER — LEVOTHYROXINE SODIUM 100 MCG PO TABS
100.0000 ug | ORAL_TABLET | Freq: Every day | ORAL | Status: DC
  Administered 2020-05-12 – 2020-05-13 (×2): 100 ug via ORAL
  Filled 2020-05-12 (×2): qty 1

## 2020-05-12 NOTE — Progress Notes (Signed)
[place] for IV heparin - per [facility], resume IV heparin 6 hours after surgery 7/27 with no bolus Indication: protein S deficiency, SMV thrombus (bridging while off Eliquis)  Allergies  Allergen Reactions  . Tylenol [Acetaminophen] Other (See Comments)    r/t elevated LFTs     Patient Measurements: Height: 6\' 3"  (190.5 cm) Weight: (!) 110.3 kg (243 lb 3.2 oz) IBW/kg (Calculated) : 84.5 Heparin Dosing Weight: 107 kg  Vital Signs: Temp: 99.1 F (37.3 C) (07/28 0530) Temp Source: Oral (07/28 0530) BP: 163/87 (07/28 0530) Pulse Rate: 54 (07/28 0530)  Labs: Recent Labs    05/10/20 0149 05/10/20 0149 05/10/20 1707 05/11/20 0041 05/12/20 0458  HGB 14.7   < >  --  14.3 13.1  HCT 44.2  --   --  43.0 39.6  PLT 204  --   --  103* 81*  APTT  --   --  23* 117* 64*  HEPARINUNFRC  --   --  0.46  --  0.27*  CREATININE 1.29*  --   --  1.01 0.86   < > = values in this interval not displayed.    Estimated Creatinine Clearance: 173 mL/min (by C-G formula based on SCr of 0.86 mg/dL).   Medical History: Past Medical History:  Diagnosis Date  . GERD (gastroesophageal reflux disease)   . Graves disease 08/18/2015  . Hyperlipidemia   . Hypothyroid   . Palpitations   . PSC (primary sclerosing cholangitis)   . Thrombosis    superior mesenteric vein - 2019  . Vitamin D deficiency     Medications:  Medications Prior to Admission  Medication Sig Dispense Refill Last Dose  . alum & mag hydroxide-simeth (MAALOX ADVANCED MAX ST) 400-400-40 MG/5ML suspension Take 15 mLs by mouth every 6 (six) hours as needed for indigestion. 355 mL 0 Past Month at Unknown time  . apixaban (ELIQUIS) 5 MG TABS tablet TAKE 1 TABLET(5 MG) BY MOUTH TWICE DAILY (Patient taking differently: Take 5 mg by mouth 2 (two) times daily. ) 180 tablet 0 05/09/2020 at unk to mother   . bisoprolol-hydrochlorothiazide (ZIAC) 10-6.25 MG tablet Take 1 tablet Daily for BP (Patient taking  differently: Take 1 tablet by mouth daily. ) 90 tablet 0 05/09/2020 at Unknown time  . busPIRone (BUSPAR) 10 MG tablet Take 1 tablet 3 x /day for Anxiety (Patient taking differently: Take 10 mg by mouth 3 (three) times daily. ) 270 tablet 1 05/09/2020 at Unknown time  . Cholecalciferol (VITAMIN D3) 5000 units CAPS Take 1 capsule (5,000 Units total) by mouth every other day. (Patient taking differently: Take 5,000 Units by mouth daily. )   05/09/2020 at Unknown time  . escitalopram (LEXAPRO) 20 MG tablet Take 1 tablet Daily for Mood, Anxiety, Irritability  & Depression (Patient taking differently: Take 20 mg by mouth daily. ) 30 tablet 0 05/09/2020 at Unknown time  . ezetimibe (ZETIA) 10 MG tablet Take 1 tablet Daily for Cholesterol (Patient taking differently: Take 10 mg by mouth daily. ) 90 tablet 0 05/09/2020 at Unknown time  . levothyroxine (SYNTHROID) 100 MCG tablet Take 1 tablet daily on an empty stomach with only water for 30 minutes & no Antacid meds, Calcium or Magnesium for 4 hours & avoid Biotin (Patient taking differently: Take 100 mcg by mouth daily before breakfast. ) 90 tablet 0 05/09/2020 at Unknown time  . omeprazole (PRILOSEC) 40 MG capsule TAKE ONE CAPSULE BY MOUTH EVERY MORNING BEFORE BREAKFAST (Patient taking  differently: Take 40 mg by mouth daily. ) 90 capsule 1 05/09/2020 at Unknown time  . ondansetron (ZOFRAN) 8 MG tablet Take 1 tablet 3 x /day . . . ONLY . Marland Kitchen .  if needed for Nausea (Patient taking differently: Take 8 mg by mouth daily. ) 60 tablet 0 05/09/2020 at Unknown time   Scheduled:  . pantoprazole (PROTONIX) IV  40 mg Intravenous QHS   Infusions:  . dextrose 5 % and 0.45 % NaCl with KCl 20 mEq/L 100 mL/hr at 05/12/20 0316  . heparin 1,300 Units/hr (05/11/20 2132)  . piperacillin-tazobactam (ZOSYN)  IV 3.375 g (05/12/20 0149)   PRN:   Assessment: 59 yoM with PMH of SMV thrombus and protein S deficiency on Eliquis PTA, admitted 7/26 for abdominal pain. Found to have acute  appendicitis and Eliquis held to allow for appendectomy. Pharmacy to bridge with heparin perioperatively.    Baseline heparin level slightly elevated at 0.46 as anticipated due to lab-drug interaction with apixaban, aPTT WNL at 23  Prior anticoagulation: Eliquis 5 mg PO bid, last dose 7/25 AM  Significant events:  Today, 05/12/2020:  APTT slightly subtherapeutic = 64 sec on 1300 units/hr this AM with HL = 0.27 also slightly subtherapeutic.  CBC: Hg WNL, Pltc 204>>103>> 81  Fairly rapid decrease in pltc however patient's baseline appears to be 130s-150s.  He was on IV heparin infusion 7/15>>7/17 so could see heparin-induced thrombocytopenia sooner than typical timeframe of 5-7 days after initiation.  Goal of Therapy: Heparin level 0.3-0.7 units/ml APTT goal 66-102 sec Monitor platelets by anticoagulation protocol: Yes  Plan:  Increase IV heparin to 1400 units/hr  As patient has now been off Eliquis ~ 72 hrs and APTT and HL correlate (both slightly subtherapeutic this AM), will monitor with just HL going forward  Check heparin level 6 hr after rate increase  Daily CBC and heparin level  Monitor for signs of bleeding or thrombosis   Leone Haven, PharmD 05/12/2020 6:24 AM

## 2020-05-12 NOTE — Progress Notes (Signed)
[place] for IV heparin - per [facility], resume IV heparin 6 hours after surgery 7/27 with no bolus Indication: protein S deficiency, SMV thrombus (bridging while off Eliquis)  Allergies  Allergen Reactions  . Tylenol [Acetaminophen] Other (See Comments)    r/t elevated LFTs     Patient Measurements: Height: 6\' 3"  (190.5 cm) Weight: (!) 110.3 kg (243 lb 3.2 oz) IBW/kg (Calculated) : 84.5 Heparin Dosing Weight: 107 kg  Vital Signs: Temp: 98.6 F (37 C) (07/28 2144) Temp Source: Oral (07/28 1336) BP: 177/74 (07/28 2144) Pulse Rate: 70 (07/28 2144)  Labs: Recent Labs    05/10/20 0149 05/10/20 0149 05/10/20 1707 05/10/20 1707 05/11/20 0041 05/12/20 0458 05/12/20 1319 05/12/20 2108  HGB 14.7   < >  --   --  14.3 13.1  --   --   HCT 44.2  --   --   --  43.0 39.6  --   --   PLT 204  --   --   --  103* 81*  --   --   APTT  --   --  23*  --  117* 64*  --   --   HEPARINUNFRC  --   --  0.46   < >  --  0.27* 0.22* 0.28*  CREATININE 1.29*  --   --   --  1.01 0.86  --   --    < > = values in this interval not displayed.    Estimated Creatinine Clearance: 173 mL/min (by C-G formula based on SCr of 0.86 mg/dL).  Assessment: 74 yoM with PMH of SMV thrombus and protein S deficiency on Eliquis PTA, admitted 7/26 for abdominal pain. Found to have acute appendicitis and Eliquis held to allow for appendectomy. Pharmacy to bridge with heparin perioperatively.    Baseline heparin level slightly elevated at 0.46 as anticipated due to lab-drug interaction with apixaban, aPTT WNL at 23  Prior anticoagulation: Eliquis 5 mg PO bid, last dose 7/25 AM  Significant events:  Today, 05/12/2020:  HL = 0.28 is increased but still slightly subtherapeutic after rate increased from 1400 to 1600 units/hr  No complications of therapy noted .  CBC: Hg WNL, Pltc 204>>103>> 81  Fairly rapid decrease in pltc however patient's baseline appears to be 130s-150s.  He was  on IV heparin infusion 7/15>>7/17 so could see heparin-induced thrombocytopenia sooner than typical timeframe of 5-7 days after initiation.  Goal of Therapy: Heparin level 0.3-0.7 units/ml Monitor platelets by anticoagulation protocol: Yes  Plan:  Increase IV heparin to 1800 units/hr  Check heparin level 6 hr after rate increase  Daily CBC and heparin level  Monitor for signs of bleeding or thrombosis   Leone Haven, PharmD 05/12/2020 10:15 PM

## 2020-05-12 NOTE — Progress Notes (Signed)
1 Day Post-Op  Subjective: CC: n/v  Patient reports constant 6-7/10 pain in his RLQ/periumbical abdomen. He has gotten out of bed in the room but not in halls. Voiding. He reports nausea, self induced emesis x 3, bloating, burping and belching. Notes unable to tolerate cld as increases above symptoms. No flatus or BM.   Objective: Vital signs in last 24 hours: Temp:  [97.5 F (36.4 C)-100.1 F (37.8 C)] 99.1 F (37.3 C) (07/28 0530) Pulse Rate:  [54-104] 69 (07/28 0659) Resp:  [16-21] 18 (07/28 0530) BP: (148-190)/(67-119) 156/90 (07/28 0659) SpO2:  [95 %-100 %] 100 % (07/28 0659) Last BM Date: 05/08/20  Intake/Output from previous day: 07/27 0701 - 07/28 0700 In: 1550.5 [P.O.:240; I.V.:1203.9; IV Piggyback:106.6] Out: 1350 [Urine:1275; Blood:75] Intake/Output this shift: No intake/output data recorded.  PE: Gen:  Alert, NAD, pleasant Pulm:  Normal rate and effort  Abd: Soft, moderate distension, tenderness around laparoscopic incisions and in the periumbilical abdomen and RLQ. No rebound, rigidity or guarding. Hypoactive bowel sounds. Incisions with glue intact appears well and are without drainage, bleeding, or signs of infection Ext:  No LE edema  Psych: A&Ox3  Skin: no rashes noted, warm and dry  Lab Results:  Recent Labs    05/11/20 0041 05/12/20 0458  WBC 8.4 6.7  HGB 14.3 13.1  HCT 43.0 39.6  PLT 103* 81*   BMET Recent Labs    05/11/20 0041 05/12/20 0458  NA 139 137  K 3.1* 3.1*  CL 103 100  CO2 24 25  GLUCOSE 122* 147*  BUN 9 7  CREATININE 1.01 0.86  CALCIUM 9.0 8.9   PT/INR No results for input(s): LABPROT, INR in the last 72 hours. CMP     Component Value Date/Time   NA 137 05/12/2020 0458   K 3.1 (L) 05/12/2020 0458   CL 100 05/12/2020 0458   CO2 25 05/12/2020 0458   GLUCOSE 147 (H) 05/12/2020 0458   BUN 7 05/12/2020 0458   CREATININE 0.86 05/12/2020 0458   CREATININE 1.14 05/05/2020 1614   CALCIUM 8.9 05/12/2020 0458   PROT 8.7  (H) 05/10/2020 0149   ALBUMIN 5.4 (H) 05/10/2020 0149   AST 27 05/10/2020 0149   AST 303 (HH) 08/16/2018 1150   ALT 22 05/10/2020 0149   ALT 543 (HH) 08/16/2018 1150   ALKPHOS 46 05/10/2020 0149   BILITOT 1.0 05/10/2020 0149   BILITOT 10.3 (HH) 08/16/2018 1150   GFRNONAA >60 05/12/2020 0458   GFRNONAA 88 05/05/2020 1614   GFRAA >60 05/12/2020 0458   GFRAA 102 05/05/2020 1614   Lipase     Component Value Date/Time   LIPASE 26 05/10/2020 0149       Studies/Results: No results found.  Anti-infectives: Anti-infectives (From admission, onward)   Start     Dose/Rate Route Frequency Ordered Stop   05/11/20 1800  piperacillin-tazobactam (ZOSYN) IVPB 3.375 g     Discontinue     3.375 g 12.5 mL/hr over 240 Minutes Intravenous Every 8 hours 05/11/20 1625     05/11/20 1000  cefTRIAXone (ROCEPHIN) 2 g in sodium chloride 0.9 % 100 mL IVPB  Status:  Discontinued       "And" Linked Group Details   2 g 200 mL/hr over 30 Minutes Intravenous Every 24 hours 05/10/20 1350 05/11/20 1540   05/10/20 1800  metroNIDAZOLE (FLAGYL) IVPB 500 mg  Status:  Discontinued       "And" Linked Group Details   500 mg 100 mL/hr over  60 Minutes Intravenous Every 8 hours 05/10/20 1350 05/11/20 1540   05/10/20 0915  cefTRIAXone (ROCEPHIN) 2 g in sodium chloride 0.9 % 100 mL IVPB       "And" Linked Group Details   2 g 200 mL/hr over 30 Minutes Intravenous  Once 05/10/20 0902 05/10/20 1223   05/10/20 0915  metroNIDAZOLE (FLAGYL) IVPB 500 mg       "And" Linked Group Details   500 mg 100 mL/hr over 60 Minutes Intravenous  Once 05/10/20 0902 05/10/20 1115       Assessment/Plan HTN HLD Grave's disease 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 - s/p Laparoscopic debridement of abscess containing yellow pus and thick fibrinous exudates by Dr. Hassell Done on 05/11/2020 - POD #1 - Appears to be developing an ileus. NGT, NPO - Continue abx - Await path - Mobilize - IS  FEN -NPO, IVF VTE -SCDs, please hold Eliquis - continue IV Heparin  ID -Rocephin/Flagyl 7/26 - 7/27. Zosyn 7/27 >> Foley - None Follow-Up - Dr. Hassell Done    LOS: 2 days    Jillyn Ledger , [facility] 05/12/2020, 8:39 AM Please see Amion for pager number during day hours 7:00am-4:30pm

## 2020-05-12 NOTE — Progress Notes (Signed)
[facility]                                                                              Patient Demographics  Kyle Gonzales, is a 29 y.o. male, DOB - 11-04-1990, FBP:[id number]  Admit date - 05/10/2020   Admitting Physician Harold Hedge, MD  Outpatient Primary MD for the patient is [name], MD  Outpatient specialists:   LOS - 2  days   Medical records reviewed and are as summarized below:    Chief Complaint  Patient presents with  . Abdominal Pain       Brief summary   Patient is a 29 year old male with past medical history of primary sclerosing cholangitis follows with Dr. 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 CT abdomen pelvis at [facility] acute nonperforated appendicitis. He was given ceftriaxone and Flagyl transferred to [facility] after ED physician had discussion with Dr. Hassell Done, general surgery on-call.    Eliquis was held and patient was admitted for further management.  Assessment & Plan    Principal Problem: Acute appendicitis without perforation -With underlying chronic ruptured appendicitis with abscess  -Patient was placed on n.p.o. status with IV fluids, IV antibiotics -General surgery was consulted, patient underwent laparoscopic debridement of the abscess containing yellow pus and thick fibrinous exudates, Dr. Hassell Done.  Postop day #1 -Patient reports no flatus or BM, seen by general surgery today, developing ileus, recommended NGT   Active problems History of protein S deficiency with history of SMV thrombosis -Eliquis currently held, due to surgery, n.p.o. -Continue heparin bridge until patient able to  take p.o. and okay with general surgery to resume Eliquis   Essential hypertension -BP readings somewhat elevated due to pain.  -Currently n.p.o., will place on scheduled IV Lopressor 2.5 mg every 6 hours, hydralazine as needed with parameters  Mild acute kidney injury, hypokalemia -Potassium 3.1, currently n.p.o. status, placed on IV potassium replacement, continue IV hydration -Creatinine 0.8, improved.  Creatinine was 1.29 on admission    History of Graves' disease status post Bernedette Auston ablation now with acquired hypothyroidism -TSH 9.45, on 7/21.  Continue Synthroid 125 MCG, appears to have been increased at admission   Hyperlipidemia Holding home medications.  Thrombocytopenia Platelets trending down, follow closely, on heparin drip  History of primary sclerosing cholangitis Chronic changes noted on CT scan.  Patient is followed by [facility] gastroenterology.   Obesity Estimated body mass index is 30.4 kg/m as calculated from the following:   Height as of this encounter: 6\' 3"  (1.905 m).   Weight as of this encounter: 110.3 kg.  Code Status: Full CODE STATUS DVT Prophylaxis: Heparin drip Family Communication: Discussed all imaging results, lab results, explained to the patient    Disposition Plan:     Status is: Inpatient  Remains inpatient appropriate because:IV treatments appropriate due to intensity of illness or inability to take PO   Dispo:  Patient  From: Home  Planned Disposition: Home  Expected discharge date: 05/13/20  Medically stable for discharge: No       Time Spent in minutes   35 minutes  Procedures:   laparoscopic debridement of the abscess*7/27  Consultants:   General surgery  Antimicrobials:   Anti-infectives (From admission, onward)   Start     Dose/Rate Route Frequency Ordered Stop   05/11/20 1800  piperacillin-tazobactam (ZOSYN) IVPB 3.375 g     Discontinue     3.375 g 12.5 mL/hr over 240 Minutes Intravenous Every 8 hours 05/11/20  1625     05/11/20 1000  cefTRIAXone (ROCEPHIN) 2 g in sodium chloride 0.9 % 100 mL IVPB  Status:  Discontinued       "And" Linked Group Details   2 g 200 mL/hr over 30 Minutes Intravenous Every 24 hours 05/10/20 1350 05/11/20 1540   05/10/20 1800  metroNIDAZOLE (FLAGYL) IVPB 500 mg  Status:  Discontinued       "And" Linked Group Details   500 mg 100 mL/hr over 60 Minutes Intravenous Every 8 hours 05/10/20 1350 05/11/20 1540   05/10/20 0915  cefTRIAXone (ROCEPHIN) 2 g in sodium chloride 0.9 % 100 mL IVPB       "And" Linked Group Details   2 g 200 mL/hr over 30 Minutes Intravenous  Once 05/10/20 0902 05/10/20 1223   05/10/20 0915  metroNIDAZOLE (FLAGYL) IVPB 500 mg       "And" Linked Group Details   500 mg 100 mL/hr over 60 Minutes Intravenous  Once 05/10/20 0902 05/10/20 1115          Medications  Scheduled Meds: . pantoprazole (PROTONIX) IV  40 mg Intravenous QHS   Continuous Infusions: . dextrose 5 % and 0.45 % NaCl with KCl 20 mEq/L 100 mL/hr at 05/12/20 0316  . heparin 1,400 Units/hr (05/12/20 0652)  . piperacillin-tazobactam (ZOSYN)  IV 3.375 g (05/12/20 1031)   PRN Meds:.metoprolol tartrate, morphine injection, ondansetron **OR** ondansetron (ZOFRAN) IV, oxyCODONE, prochlorperazine      Subjective:   Kyle Gonzales was seen and examined today.  BP not well controlled, no flatus, BM.  Abdominal distention with pain, 6/10 in the RLQ, periumbilical area.  Nausea with bloating.  No fevers or chills.  No chest pain or shortness of breath  Objective:   Vitals:   05/12/20 0530 05/12/20 0659 05/12/20 1001 05/12/20 1336  BP: (!) 163/87 (!) 156/90 (!) 163/78 (!) 131/75  Pulse: 54 69 56 53  Resp: 18     Temp: 99.1 F (37.3 C)  99 F (37.2 C) 98.3 F (36.8 C)  TempSrc: Oral  Oral Oral  SpO2: 97% 100% 98% 100%  Weight:      Height:        Intake/Output Summary (Last 24 hours) at 05/12/2020 1426 Last data filed at 05/12/2020 1338 Gross per 24 hour  Intake 550.5 ml    Output 1500 ml  Net -949.5 ml     Wt Readings from Last 3 Encounters:  05/10/20 (!) 110.3 kg  05/05/20 110.8 kg  11/18/19 104.6 kg     Exam  General: Alert and oriented x 3, NAD, comfortable  Cardiovascular: S1 S2 auscultated, no murmurs, RRR  Respiratory: Clear to auscultation bilaterally, no wheezing, rales or rhonchi  Gastrointestinal: Soft, moderate distention with tenderness in periumbilical and RLQ.  Ext: no pedal edema bilaterally  Neuro: no new deficits  Musculoskeletal: No digital cyanosis, clubbing  Skin: Incisions with glue, intact  Psych: Normal affect and  demeanor, alert and oriented x3    Data Reviewed:  I have personally reviewed following labs and imaging studies  Micro Results Recent Results (from the past 240 hour(s))  SARS Coronavirus 2 by RT PCR (hospital order, performed in [facility] hospital lab) Nasopharyngeal Nasopharyngeal Swab     Status: None   Collection Time: 05/10/20  9:31 AM   Specimen: Nasopharyngeal Swab  Result Value Ref Range Status   SARS Coronavirus 2 NEGATIVE NEGATIVE Final    Comment: (NOTE) SARS-CoV-2 target nucleic acids are NOT DETECTED.  The SARS-CoV-2 RNA is generally detectable in upper and lower respiratory specimens during the acute phase of infection. The lowest concentration of SARS-CoV-2 viral copies this assay can detect is 250 copies / mL. A negative result does not preclude SARS-CoV-2 infection and should not be used as the sole basis for treatment or other patient management decisions.  A negative result may occur with improper specimen collection / handling, submission of specimen other than nasopharyngeal swab, presence of viral mutation(s) within the areas targeted by this assay, and inadequate number of viral copies (<250 copies / mL). A negative result must be combined with clinical observations, patient history, and epidemiological information.  Fact Sheet for Patients:    StrictlyIdeas.no  Fact Sheet for Healthcare Providers: BankingDealers.co.za  This test is not yet approved or  cleared by the [place] FDA and has been authorized for detection and/or diagnosis of SARS-CoV-2 by FDA under an Emergency Use Authorization (EUA).  This EUA will remain in effect (meaning this test can be used) for the duration of the COVID-19 declaration under Section 564(b)(1) of the Act, 21 U.S.C. section 360bbb-3(b)(1), unless the authorization is terminated or revoked sooner.  Performed at [facility], [address] [place], [place] [address]   Surgical pcr screen     Status: None   Collection Time: 05/11/20  1:22 AM   Specimen: Nasal Mucosa; Nasal Swab  Result Value Ref Range Status   MRSA, PCR NEGATIVE NEGATIVE Final   Staphylococcus aureus NEGATIVE NEGATIVE Final    Comment: (NOTE) The Xpert SA Assay (FDA approved for NASAL specimens in patients 57 years of age and older), is one component of a comprehensive surveillance program. It is not intended to diagnose infection nor to guide or monitor treatment. Performed at [facility], [place] [address] [place], [place] [address]     Radiology Reports CT Abdomen Pelvis W Contrast  Result Date: 05/10/2020 CLINICAL DATA:  29 year old male with history of acute onset of left lower quadrant tenderness and left-sided flank pain. Leukocytosis. History of primary sclerosing cholangitis. EXAM: CT ABDOMEN AND PELVIS WITH CONTRAST TECHNIQUE: Multidetector CT imaging of the abdomen and pelvis was performed using the standard protocol following bolus administration of intravenous contrast. CONTRAST:  117mL OMNIPAQUE IOHEXOL 300 MG/ML  SOLN COMPARISON:  CT the abdomen and pelvis 07/05/2018. FINDINGS: Lower chest: Unremarkable. Hepatobiliary: Liver has a irregular contour, and there is moderate intrahepatic biliary ductal dilatation, increased  slightly compared to prior studies, compatible with reported clinical history of primary sclerosing cholangitis. In segment 7 of the liver there is a 2.6 x 2.0 cm intermediate attenuation lesion (axial image 22 of series 2). In segment 5 of the liver there is a 1.5 x 1.4 cm intermediate attenuation lesion (axial image 30 of series 2). Both of these lesions were previously characterized as cavernous hemangiomas on prior abdominal MRI 08/16/2018. No other suspicious hepatic lesions. Status post cholecystectomy. Cavernous transformation in the  porta hepatis. Common bile duct is normal in caliber. Pancreas: No pancreatic mass. No pancreatic ductal dilatation. No pancreatic or peripancreatic fluid collections or inflammatory changes. Spleen: Unremarkable. Adrenals/Urinary Tract: Bilateral kidneys and adrenal glands are normal in appearance. No hydroureteronephrosis. Urinary bladder is normal in appearance. Stomach/Bowel: Normal appearance of the stomach. No pathologic dilatation of small bowel or colon. The appendix is dilated and inflamed, concerning for an acute appendicitis. Appendix: Location: Low anatomic pelvis Diameter: 2.0 cm Appendicolith: None Mucosal hyper-enhancement: Present Extraluminal gas: None Periappendiceal collection: None Vascular/Lymphatic: No significant atherosclerotic disease, aneurysm or dissection noted in the abdominal or pelvic vasculature. 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 No lymphadenopathy noted in the abdomen or pelvis. Reproductive: Prostate gland and seminal vesicles are unremarkable in appearance. Other: No significant volume of ascites.  No pneumoperitoneum. Musculoskeletal: There are no aggressive appearing lytic or blastic lesions noted in the visualized portions of the skeleton. IMPRESSION: 1. Findings are compatible with acute appendicitis. No periappendiceal abscess or signs of frank perforation are noted at this  time. Surgical consultation is recommended. 2. Chronic changes related to primary sclerosing cholangitis redemonstrated with increased intrahepatic biliary ductal dilatation, as above. 3. 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 4. 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 Electronically Signed   By: Vinnie Langton M.D.   On: 05/10/2020 08:33   DG Abd Portable 1V  Result Date: 05/12/2020 CLINICAL DATA:  Encounter for imaging study to confirm nasogastric (NG) tube placement EXAM: PORTABLE ABDOMEN - 1 VIEW COMPARISON:  04/28/2018 FINDINGS: Nasogastric tube passes well below the diaphragm, curling within the proximal to mid stomach. There is increased bowel gas with mild small bowel dilation. Small-bowel dilation is similar to the prior study. IMPRESSION: Well-positioned nasogastric tube. Electronically Signed   By: Lajean Manes M.D.   On: 05/12/2020 11:37    Lab Data:  CBC: Recent Labs  Lab 05/05/20 1614 05/10/20 0149 05/11/20 0041 05/12/20 0458  WBC 6.3 16.7* 8.4 6.7  NEUTROABS 4,990  --   --   --   HGB 14.9 14.7 14.3 13.1  HCT 44.5 44.2 43.0 39.6  MCV 91.6 91.5 92.5 92.3  PLT 135* 204 103* 81*   Basic Metabolic Panel: Recent Labs  Lab 05/05/20 1614 05/10/20 0149 05/11/20 0041 05/12/20 0458  NA 140 137 139 137  K 4.0 3.1* 3.1* 3.1*  CL 106 101 103 100  CO2 25 21* 24 25  GLUCOSE 97 158* 122* 147*  BUN 13 13 9 7   CREATININE 1.14 1.29* 1.01 0.86  CALCIUM 10.0 9.7 9.0 8.9  MG 2.1  --  1.7  --    GFR: Estimated Creatinine Clearance: 173 mL/min (by C-G formula based on SCr of 0.86 mg/dL). Liver Function Tests: Recent Labs  Lab 05/05/20 1614 05/10/20 0149  AST 20 27  ALT 19 22  ALKPHOS  --  46  BILITOT 0.9 1.0  PROT 8.3* 8.7*  ALBUMIN  --  5.4*   Recent Labs  Lab 05/10/20 0149  LIPASE 26   No results for input(s): AMMONIA in the last 168 hours. Coagulation Profile: No  results for input(s): INR, PROTIME in the last 168 hours. Cardiac Enzymes: No results for input(s): CKTOTAL, CKMB, CKMBINDEX, TROPONINI in the last 168 hours. BNP (last 3 results) No results for input(s): PROBNP in the last 8760 hours. HbA1C: No results for input(s): HGBA1C in the last  72 hours. CBG: No results for input(s): GLUCAP in the last 168 hours. Lipid Profile: No results for input(s): CHOL, HDL, LDLCALC, TRIG, CHOLHDL, LDLDIRECT in the last 72 hours. Thyroid Function Tests: No results for input(s): TSH, T4TOTAL, FREET4, T3FREE, THYROIDAB in the last 72 hours. Anemia Panel: No results for input(s): VITAMINB12, FOLATE, FERRITIN, TIBC, IRON, RETICCTPCT in the last 72 hours. Urine analysis:    Component Value Date/Time   COLORURINE YELLOW 05/10/2020 0901   APPEARANCEUR CLEAR 05/10/2020 0901   LABSPEC 1.010 05/10/2020 0901   PHURINE 8.0 05/10/2020 0901   GLUCOSEU NEGATIVE 05/10/2020 0901   HGBUR MODERATE (A) 05/10/2020 0901   BILIRUBINUR NEGATIVE 05/10/2020 0901   KETONESUR 40 (A) 05/10/2020 0901   PROTEINUR NEGATIVE 05/10/2020 0901   NITRITE NEGATIVE 05/10/2020 0901   LEUKOCYTESUR NEGATIVE 05/10/2020 0901     Kadeisha Betsch M.D. [facility] 05/12/2020, 2:26 PM   Call night coverage person covering after 7pm

## 2020-05-12 NOTE — Progress Notes (Addendum)
Kyle Gonzales for IV heparin - per [facility], resume IV heparin 6 hours after surgery 7/27 with no bolus Indication: protein S deficiency, SMV thrombus (bridging while off Eliquis)  Allergies  Allergen Reactions  . Tylenol [Acetaminophen] Other (See Comments)    r/t elevated LFTs     Patient Measurements: Height: 6\' 3"  (190.5 cm) Weight: (!) 110.3 kg (243 lb 3.2 oz) IBW/kg (Calculated) : 84.5 Heparin Dosing Weight: 107 kg  Vital Signs: Temp: 98.3 F (36.8 C) (07/28 1336) Temp Source: Oral (07/28 1336) BP: 131/75 (07/28 1336) Pulse Rate: 53 (07/28 1336)  Labs: Recent Labs    05/10/20 0149 05/10/20 0149 05/10/20 1707 05/11/20 0041 05/12/20 0458 05/12/20 1319  HGB 14.7   < >  --  14.3 13.1  --   HCT 44.2  --   --  43.0 39.6  --   PLT 204  --   --  103* 81*  --   APTT  --   --  23* 117* 64*  --   HEPARINUNFRC  --   --  0.46  --  0.27* 0.22*  CREATININE 1.29*  --   --  1.01 0.86  --    < > = values in this interval not displayed.    Estimated Creatinine Clearance: 173 mL/min (by C-G formula based on SCr of 0.86 mg/dL).  Assessment: 57 yoM with PMH of SMV thrombus and protein S deficiency on Eliquis PTA, admitted 7/26 for abdominal pain. Found to have acute appendicitis and Eliquis held to allow for appendectomy. Pharmacy to bridge with heparin perioperatively.    Baseline heparin level slightly elevated at 0.46 as anticipated due to lab-drug interaction with apixaban, aPTT WNL at 23  Prior anticoagulation: Eliquis 5 mg PO bid, last dose 7/25 AM  Significant events:  Today, 05/12/2020:  HL = 0.22 is slightly subtherapeutic after rate increased from 1300 to 1400 units/hr  Heparin infusing w/o issues per RN, some bleeding from another IV site earlier per RN   CBC: Hg WNL, Pltc 204>>103>> 81  Fairly rapid decrease in pltc however patient's baseline appears to be 130s-150s.  He was on IV heparin infusion 7/15>>7/17 so could see  heparin-induced thrombocytopenia sooner than typical timeframe of 5-7 days after initiation.  Goal of Therapy: Heparin level 0.3-0.7 units/ml APTT goal 66-102 sec Monitor platelets by anticoagulation protocol: Yes  Plan:  Increase IV heparin to 1600 units/hr  Check heparin level 6 hr after rate increase  Daily CBC and heparin level  Monitor for signs of bleeding or thrombosis   Eudelia Bunch, Pharm.D 05/12/2020 2:37 PM

## 2020-05-13 ENCOUNTER — Inpatient Hospital Stay (HOSPITAL_COMMUNITY): Payer: Self-pay

## 2020-05-13 LAB — CBC
HCT: 40.4 % (ref 39.0–52.0)
Hemoglobin: 13.5 g/dL (ref 13.0–17.0)
MCH: 30.8 pg (ref 26.0–34.0)
MCHC: 33.4 g/dL (ref 30.0–36.0)
MCV: 92 fL (ref 80.0–100.0)
Platelets: 110 10*3/uL — ABNORMAL LOW (ref 150–400)
RBC: 4.39 MIL/uL (ref 4.22–5.81)
RDW: 13.2 % (ref 11.5–15.5)
WBC: 8.2 10*3/uL (ref 4.0–10.5)
nRBC: 0 % (ref 0.0–0.2)

## 2020-05-13 LAB — BASIC METABOLIC PANEL
Anion gap: 15 (ref 5–15)
BUN: 10 mg/dL (ref 6–20)
CO2: 23 mmol/L (ref 22–32)
Calcium: 9.1 mg/dL (ref 8.9–10.3)
Chloride: 100 mmol/L (ref 98–111)
Creatinine, Ser: 0.94 mg/dL (ref 0.61–1.24)
GFR calc Af Amer: >60 mL/min (ref >60)
GFR calc non Af Amer: >60 mL/min (ref >60)
Glucose, Bld: 124 mg/dL — ABNORMAL HIGH (ref 70–99)
Potassium: 3 mmol/L — ABNORMAL LOW (ref 3.5–5.1)
Sodium: 138 mmol/L (ref 135–145)

## 2020-05-13 LAB — HEPARIN LEVEL (UNFRACTIONATED)
Heparin Unfractionated: 0.27 IU/mL — ABNORMAL LOW (ref 0.30–0.70)
Heparin Unfractionated: 0.2 IU/mL — ABNORMAL LOW (ref 0.30–0.70)

## 2020-05-13 LAB — MAGNESIUM: Magnesium: 2.1 mg/dL (ref 1.7–2.4)

## 2020-05-13 MED ORDER — HEPARIN (PORCINE) 25000 UT/250ML-% IV SOLN
1900.0000 [IU]/h | INTRAVENOUS | Status: DC
  Administered 2020-05-13: 18:00:00 1700 [IU]/h via INTRAVENOUS
  Administered 2020-05-14: 1900 [IU]/h via INTRAVENOUS
  Filled 2020-05-13 (×3): qty 250

## 2020-05-13 MED ORDER — LEVOTHYROXINE SODIUM 100 MCG/5ML IV SOLN
50.0000 ug | Freq: Every day | INTRAVENOUS | Status: DC
  Administered 2020-05-14 – 2020-05-17 (×4): 50 ug via INTRAVENOUS
  Filled 2020-05-13 (×4): qty 5

## 2020-05-13 MED ORDER — PANTOPRAZOLE SODIUM 40 MG IV SOLR
40.0000 mg | Freq: Two times a day (BID) | INTRAVENOUS | Status: DC
  Administered 2020-05-13 – 2020-05-18 (×11): 40 mg via INTRAVENOUS
  Filled 2020-05-13 (×11): qty 40

## 2020-05-13 MED ORDER — POTASSIUM CHLORIDE 10 MEQ/100ML IV SOLN
10.0000 meq | INTRAVENOUS | Status: AC
  Administered 2020-05-13 (×3): 10 meq via INTRAVENOUS
  Filled 2020-05-13 (×2): qty 100

## 2020-05-13 MED ORDER — METOPROLOL TARTRATE 5 MG/5ML IV SOLN
5.0000 mg | Freq: Four times a day (QID) | INTRAVENOUS | Status: DC
  Administered 2020-05-13 – 2020-05-17 (×16): 5 mg via INTRAVENOUS
  Filled 2020-05-13 (×16): qty 5

## 2020-05-13 MED ORDER — LIP MEDEX EX OINT
TOPICAL_OINTMENT | CUTANEOUS | Status: AC
  Administered 2020-05-13: 1
  Filled 2020-05-13: qty 7

## 2020-05-13 MED ORDER — POTASSIUM CHLORIDE 10 MEQ/100ML IV SOLN
10.0000 meq | INTRAVENOUS | Status: AC
  Filled 2020-05-13: qty 100

## 2020-05-13 MED ORDER — PANTOPRAZOLE SODIUM 40 MG IV SOLR
40.0000 mg | Freq: Once | INTRAVENOUS | Status: AC
  Administered 2020-05-13: 05:00:00 40 mg via INTRAVENOUS

## 2020-05-13 NOTE — Progress Notes (Signed)
Dr. Marcello Moores returned call.  Additional dose of Protonix ordered.  Heparin IV drip on hold until further notice.

## 2020-05-13 NOTE — Progress Notes (Signed)
Kyle Gonzales for IV heparin Indication: protein S deficiency, SMV thrombus (bridging while off Eliquis)  Allergies  Allergen Reactions  . Tylenol [Acetaminophen] Other (See Comments)    r/t elevated LFTs     Patient Measurements: Height: 6\' 3"  (190.5 cm) Weight: (!) 110.3 kg (243 lb 3.2 oz) IBW/kg (Calculated) : 84.5 Heparin Dosing Weight: 107 kg  Vital Signs: Temp: 98.8 F (37.1 C) (07/29 0610) Temp Source: Oral (07/29 0610) BP: 156/72 (07/29 0610) Pulse Rate: 69 (07/29 0610)  Labs: Recent Labs    05/10/20 1707 05/10/20 1707 05/11/20 0041 05/12/20 0458 05/12/20 0458 05/12/20 1319 05/12/20 2108 05/13/20 0422  HGB  --    < > 14.3 13.1  --   --   --  13.5  HCT  --   --  43.0 39.6  --   --   --  40.4  PLT  --   --  103* 81*  --   --   --  110*  APTT 23*  --  117* 64*  --   --   --   --   HEPARINUNFRC 0.46   < >  --  0.27*   < > 0.22* 0.28* 0.27*  CREATININE  --   --  1.01 0.86  --   --   --  0.94   < > = values in this interval not displayed.    Estimated Creatinine Clearance: 158.3 mL/min (by C-G formula based on SCr of 0.94 mg/dL).  Assessment: 36 yoM with PMH of SMV thrombus and protein S deficiency on Eliquis PTA, admitted 7/26 for abdominal pain. Found to have acute appendicitis and Eliquis held to allow for appendectomy. Pharmacy to bridge with heparin perioperatively.    Baseline heparin level slightly elevated at 0.46 as anticipated due to lab-drug interaction with apixaban, aPTT WNL at 23  Prior anticoagulation: Eliquis 5 mg PO bid, last dose 7/25 AM  Significant events:  7/27 s/pLaparoscopic debridement of abscess containing yellow pus and thick fibrinous exudatesby Dr. Hassell Done - heparin resumed 6 hrs after surgery w/ no bolus 7/29 AM: vomited medium amt of bright red blood in toilet.  Heparin IV stopped at 04 AM- OK to resume today w/ no bolus per CCS  Today, 05/13/2020:  0422 AM HL = 0.28 is increased but still  slightly subtherapeutic after rate increased from 1400 to 1600 units/hr- heparin drip stopped at 0355 AM after he vomited medium amount of BRB in toilet _ OK to resume heparin with no bolus per CCS  CBC: Hg WNL, Pltc 204>>103>> 81>101  Fairly rapid decrease in pltc however patient's baseline appears to be 130s-150s.  He was on IV heparin infusion 7/15>>7/17 so could see heparin-induced thrombocytopenia sooner than typical timeframe of 5-7 days after initiation.  Goal of Therapy: Heparin level 0.3-0.7 units/ml Monitor platelets by anticoagulation protocol: Yes  Plan:  resume IV heparin at 1700 units/hr  Check heparin level 6 hr after rate increase  Daily CBC and heparin level  Monitor for signs of bleeding or thrombosis  Eudelia Bunch, Pharm.D 05/13/2020 12:55 PM

## 2020-05-13 NOTE — Progress Notes (Signed)
[facility]                                                                              Patient Demographics  Kyle Gonzales, is a 29 y.o. male, DOB - 1991-01-05, WGN:[id number]  Admit date - 05/10/2020   Admitting Physician Harold Hedge, MD  Outpatient Primary MD for the patient is [name], MD  Outpatient specialists:   LOS - 3  days   Medical records reviewed and are as summarized below:    Chief Complaint  Patient presents with   Abdominal Pain       Brief summary   Patient is a 29 year old male with past medical history of primary sclerosing cholangitis follows with Dr. 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 CT abdomen pelvis at [facility] acute nonperforated appendicitis. He was given ceftriaxone and Flagyl transferred to [facility] after ED physician had discussion with Dr. Hassell Done, general surgery on-call.    Eliquis was held and patient was admitted for further management.  Assessment & Plan    Principal Problem: Acute appendicitis without perforation -With underlying chronic ruptured appendicitis with abscess  -Patient was placed on n.p.o. status with IV fluids, IV antibiotics -General surgery was consulted, patient underwent laparoscopic debridement of the abscess containing yellow pus and thick fibrinous exudates, Dr. Hassell Done.  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 -Creatinine 0.8, improved.  Creatinine was 1.29 on admission  Hypokalemia Potassium 3.0, replaced IV  History of Graves' disease status post Dellamae Rosamilia ablation now with acquired hypothyroidism -TSH 9.45, on 7/21.  Continue Synthroid 125 MCG, appears to have been increased at admission -Patient on n.p.o. status, NGT to small suction, change to IV hydralazine   Hyperlipidemia Holding home medications.  Thrombocytopenia Platelets improving, follow closely, on heparin drip  History of primary sclerosing cholangitis Chronic changes noted on CT scan.  Patient is followed by [facility] gastroenterology.   Obesity Estimated body mass index is 30.4 kg/m as calculated from the following:   Height as of this encounter: 6\' 3"  (1.905 m).   Weight as of this encounter: 110.3 kg.  Gonzales Status: Full Gonzales STATUS DVT Prophylaxis: Heparin drip Family Communication: Discussed all imaging results, lab results, explained to the patient    Disposition  Plan:     Status is: Inpatient  Remains inpatient appropriate because:IV treatments appropriate due to intensity of illness or inability to take PO   Dispo:  Patient From: Home  Planned Disposition: Home  Expected discharge date: 05/13/20  Medically stable for discharge: No       Time Spent in minutes   25 minutes  Procedures:   laparoscopic debridement of the abscess*7/27  Consultants:   General surgery  Antimicrobials:   Anti-infectives (From admission, onward)   Start      Dose/Rate Route Frequency Ordered Stop   05/11/20 1800  piperacillin-tazobactam (ZOSYN) IVPB 3.375 g     Discontinue     3.375 g 12.5 mL/hr over 240 Minutes Intravenous Every 8 hours 05/11/20 1625     05/11/20 1000  cefTRIAXone (ROCEPHIN) 2 g in sodium chloride 0.9 % 100 mL IVPB  Status:  Discontinued       "And" Linked Group Details   2 g 200 mL/hr over 30 Minutes Intravenous Every 24 hours 05/10/20 1350 05/11/20 1540   05/10/20 1800  metroNIDAZOLE (FLAGYL) IVPB 500 mg  Status:  Discontinued       "And" Linked Group Details   500 mg 100 mL/hr over 60 Minutes Intravenous Every 8 hours 05/10/20 1350 05/11/20 1540   05/10/20 0915  cefTRIAXone (ROCEPHIN) 2 g in sodium chloride 0.9 % 100 mL IVPB       "And" Linked Group Details   2 g 200 mL/hr over 30 Minutes Intravenous  Once 05/10/20 0902 05/10/20 1223   05/10/20 0915  metroNIDAZOLE (FLAGYL) IVPB 500 mg       "And" Linked Group Details   500 mg 100 mL/hr over 60 Minutes Intravenous  Once 05/10/20 0902 05/10/20 1115         Medications  Scheduled Meds:  busPIRone  10 mg Oral TID   levothyroxine  100 mcg Oral Q0600   metoprolol tartrate  2.5 mg Intravenous Q6H   pantoprazole (PROTONIX) IV  40 mg Intravenous BID   Continuous Infusions:  dextrose 5 % and 0.45 % NaCl with KCl 20 mEq/L 100 mL/hr at 05/13/20 0731   methocarbamol (ROBAXIN) IV 500 mg (05/12/20 2024)   piperacillin-tazobactam (ZOSYN)  IV 3.375 g (05/13/20 0230)   potassium chloride 10 mEq (05/13/20 1155)   PRN Meds:.hydrALAZINE, menthol-cetylpyridinium, methocarbamol (ROBAXIN) IV, morphine injection, ondansetron **OR** ondansetron (ZOFRAN) IV, oxyCODONE, phenol, prochlorperazine      Subjective:   Kyle Gonzales was seen and examined today.  BP not well controlled, still has ileus, no flatus or BM.  NGT placed today.  Still abdominal distention with pain, periumbilical area.  Nausea but no active vomiting.  No fevers.    Objective:   Vitals:   05/12/20  1336 05/12/20 2144 05/13/20 0406 05/13/20 0610  BP: (!) 131/75 (!) 177/74 (!) 156/110 (!) 156/72  Pulse: 53 70 105 69  Resp:  18 18 18   Temp: 98.3 F (36.8 C) 98.6 F (37 C) 98.1 F (36.7 C) 98.8 F (37.1 C)  TempSrc: Oral   Oral  SpO2: 100% 100% 99% 100%  Weight:      Height:        Intake/Output Summary (Last 24 hours) at 05/13/2020 1254 Last data filed at 05/13/2020 0948 Gross per 24 hour  Intake 1507.32 ml  Output 575 ml  Net 932.32 ml     Wt Readings from Last 3 Encounters:  05/10/20 (!) 110.3 kg  05/05/20 110.8 kg  11/18/19 104.6 kg   Physical  Exam  General: Alert and oriented x 3, NAD, uncomfortable  Cardiovascular: S1 S2 clear, RRR. No pedal edema b/l  Respiratory: CTAB, no wheezing, rales or rhonchi  Gastrointestinal: Soft, moderate distention with tenderness in periumbilical area   Ext: no pedal edema bilaterally  Neuro: no new deficits  Musculoskeletal: No cyanosis, clubbing  Skin: No rashes  Psych: anxious    Data Reviewed:  I have personally reviewed following labs and imaging studies  Micro Results Recent Results (from the past 240 hour(s))  SARS Coronavirus 2 by RT PCR (hospital order, performed in [facility] hospital lab) Nasopharyngeal Nasopharyngeal Swab     Status: None   Collection Time: 05/10/20  9:31 AM   Specimen: Nasopharyngeal Swab  Result Value Ref Range Status   SARS Coronavirus 2 NEGATIVE NEGATIVE Final    Comment: (NOTE) SARS-CoV-2 target nucleic acids are NOT DETECTED.  The SARS-CoV-2 RNA is generally detectable in upper and lower respiratory specimens during the acute phase of infection. The lowest concentration of SARS-CoV-2 viral copies this assay can detect is 250 copies / mL. A negative result does not preclude SARS-CoV-2 infection and should not be used as the sole basis for treatment or other patient management decisions.  A negative result may occur with improper specimen collection / handling, submission of  specimen other than nasopharyngeal swab, presence of viral mutation(s) within the areas targeted by this assay, and inadequate number of viral copies (<250 copies / mL). A negative result must be combined with clinical observations, patient history, and epidemiological information.  Fact Sheet for Patients:   StrictlyIdeas.no  Fact Sheet for Healthcare Providers: BankingDealers.co.za  This test is not yet approved or  cleared by the [place] FDA and has been authorized for detection and/or diagnosis of SARS-CoV-2 by FDA under an Emergency Use Authorization (EUA).  This EUA will remain in effect (meaning this test can be used) for the duration of the COVID-19 declaration under Section 564(b)(1) of the Act, 21 U.S.C. section 360bbb-3(b)(1), unless the authorization is terminated or revoked sooner.  Performed at [facility], [address], [place], [place] [address]   Surgical pcr screen     Status: None   Collection Time: 05/11/20  1:22 AM   Specimen: Nasal Mucosa; Nasal Swab  Result Value Ref Range Status   MRSA, PCR NEGATIVE NEGATIVE Final   Staphylococcus aureus NEGATIVE NEGATIVE Final    Comment: (NOTE) The Xpert SA Assay (FDA approved for NASAL specimens in patients 20 years of age and older), is one component of a comprehensive surveillance program. It is not intended to diagnose infection nor to guide or monitor treatment. Performed at [facility], [place] [address], [place],  [address]     Radiology Reports CT Abdomen Pelvis W Contrast  Result Date: 05/10/2020 CLINICAL DATA:  29 year old male with history of acute onset of left lower quadrant tenderness and left-sided flank pain. Leukocytosis. History of primary sclerosing cholangitis. EXAM: CT ABDOMEN AND PELVIS WITH CONTRAST TECHNIQUE: Multidetector CT imaging of the abdomen and pelvis was performed using the standard protocol  following bolus administration of intravenous contrast. CONTRAST:  183mL OMNIPAQUE IOHEXOL 300 MG/ML  SOLN COMPARISON:  CT the abdomen and pelvis 07/05/2018. FINDINGS: Lower chest: Unremarkable. Hepatobiliary: Liver has a irregular contour, and there is moderate intrahepatic biliary ductal dilatation, increased slightly compared to prior studies, compatible with reported clinical history of primary sclerosing cholangitis. In segment 7 of the liver there is a 2.6 x 2.0 cm intermediate  attenuation lesion (axial image 22 of series 2). In segment 5 of the liver there is a 1.5 x 1.4 cm intermediate attenuation lesion (axial image 30 of series 2). Both of these lesions were previously characterized as cavernous hemangiomas on prior abdominal MRI 08/16/2018. No other suspicious hepatic lesions. Status post cholecystectomy. Cavernous transformation in the porta hepatis. Common bile duct is normal in caliber. Pancreas: No pancreatic mass. No pancreatic ductal dilatation. No pancreatic or peripancreatic fluid collections or inflammatory changes. Spleen: Unremarkable. Adrenals/Urinary Tract: Bilateral kidneys and adrenal glands are normal in appearance. No hydroureteronephrosis. Urinary bladder is normal in appearance. Stomach/Bowel: Normal appearance of the stomach. No pathologic dilatation of small bowel or colon. The appendix is dilated and inflamed, concerning for an acute appendicitis. Appendix: Location: Low anatomic pelvis Diameter: 2.0 cm Appendicolith: None Mucosal hyper-enhancement: Present Extraluminal gas: None Periappendiceal collection: None Vascular/Lymphatic: No significant atherosclerotic disease, aneurysm or dissection noted in the abdominal or pelvic vasculature. 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 No lymphadenopathy noted in the abdomen or pelvis. Reproductive: Prostate gland and seminal vesicles are unremarkable in appearance. Other: No  significant volume of ascites.  No pneumoperitoneum. Musculoskeletal: There are no aggressive appearing lytic or blastic lesions noted in the visualized portions of the skeleton. IMPRESSION: 1. Findings are compatible with acute appendicitis. No periappendiceal abscess or signs of frank perforation are noted at this time. Surgical consultation is recommended. 2. Chronic changes related to primary sclerosing cholangitis redemonstrated with increased intrahepatic biliary ductal dilatation, as above. 3. 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 4. 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 Electronically Signed   By: Vinnie Langton M.D.   On: 05/10/2020 08:33   DG Abd Portable 1V  Result Date: 05/13/2020 CLINICAL DATA:  NG tube placement EXAM: PORTABLE ABDOMEN - 1 VIEW COMPARISON:  05/13/2020 FINDINGS: NG tube coils in the stomach. IMPRESSION: NG tube in the stomach. Electronically Signed   By: Rolm Baptise M.D.   On: 05/13/2020 09:52   DG Abd Portable 1V  Result Date: 05/13/2020 CLINICAL DATA:  Ileus EXAM: PORTABLE ABDOMEN - 1 VIEW COMPARISON:  May 12, 2020 FINDINGS: Nasogastric tube no longer appreciable. There remain multiple loops of dilated bowel without appreciable air-fluid level. No free air. Lung bases clear. IMPRESSION: Persistent dilatation of multiple loops of small bowel. Nasogastric tube no longer evident. Suspect ileus, although a degree of bowel obstruction cannot be excluded on this study. No free air evident. Lung bases clear. Electronically Signed   By: Lowella Grip III M.D.   On: 05/13/2020 07:11   DG Abd Portable 1V  Result Date: 05/12/2020 CLINICAL DATA:  Encounter for imaging study to confirm nasogastric (NG) tube placement EXAM: PORTABLE ABDOMEN - 1 VIEW COMPARISON:  04/28/2018 FINDINGS: Nasogastric tube passes well below the diaphragm, curling within the proximal to mid stomach. There  is increased bowel gas with mild small bowel dilation. Small-bowel dilation is similar to the prior study. IMPRESSION: Well-positioned nasogastric tube. Electronically Signed   By: Lajean Manes M.D.   On: 05/12/2020 11:37    Lab Data:  CBC: Recent Labs  Lab 05/10/20 0149 05/11/20 0041 05/12/20 0458 05/13/20 0422  WBC 16.7* 8.4 6.7 8.2  HGB 14.7 14.3 13.1 13.5  HCT 44.2 43.0 39.6 40.4  MCV 91.5 92.5 92.3 92.0  PLT 204 103* 81* 881*   Basic Metabolic Panel: Recent Labs  Lab 05/10/20  0149 05/11/20 0041 05/12/20 0458 05/13/20 0422  NA 137 139 137 138  K 3.1* 3.1* 3.1* 3.0*  CL 101 103 100 100  CO2 21* 24 25 23   GLUCOSE 158* 122* 147* 124*  BUN 13 9 7 10   CREATININE 1.29* 1.01 0.86 0.94  CALCIUM 9.7 9.0 8.9 9.1  MG  --  1.7 1.9 2.1   GFR: Estimated Creatinine Clearance: 158.3 mL/min (by C-G formula based on SCr of 0.94 mg/dL). Liver Function Tests: Recent Labs  Lab 05/10/20 0149  AST 27  ALT 22  ALKPHOS 46  BILITOT 1.0  PROT 8.7*  ALBUMIN 5.4*   Recent Labs  Lab 05/10/20 0149  LIPASE 26   No results for input(s): AMMONIA in the last 168 hours. Coagulation Profile: No results for input(s): INR, PROTIME in the last 168 hours. Cardiac Enzymes: No results for input(s): CKTOTAL, CKMB, CKMBINDEX, TROPONINI in the last 168 hours. BNP (last 3 results) No results for input(s): PROBNP in the last 8760 hours. HbA1C: No results for input(s): HGBA1C in the last 72 hours. CBG: No results for input(s): GLUCAP in the last 168 hours. Lipid Profile: No results for input(s): CHOL, HDL, LDLCALC, TRIG, CHOLHDL, LDLDIRECT in the last 72 hours. Thyroid Function Tests: No results for input(s): TSH, T4TOTAL, FREET4, T3FREE, THYROIDAB in the last 72 hours. Anemia Panel: No results for input(s): VITAMINB12, FOLATE, FERRITIN, TIBC, IRON, RETICCTPCT in the last 72 hours. Urine analysis:    Component Value Date/Time   COLORURINE YELLOW 05/10/2020 0901   APPEARANCEUR CLEAR  05/10/2020 0901   LABSPEC 1.010 05/10/2020 0901   PHURINE 8.0 05/10/2020 0901   GLUCOSEU NEGATIVE 05/10/2020 0901   HGBUR MODERATE (A) 05/10/2020 0901   BILIRUBINUR NEGATIVE 05/10/2020 0901   KETONESUR 40 (A) 05/10/2020 0901   PROTEINUR NEGATIVE 05/10/2020 0901   NITRITE NEGATIVE 05/10/2020 0901   LEUKOCYTESUR NEGATIVE 05/10/2020 0901     Malaiah Viramontes M.D. [facility] 05/13/2020, 12:54 PM   Call night coverage person covering after 7pm

## 2020-05-13 NOTE — Progress Notes (Signed)
Pharmacy: [facility]  Patient is a 29 y.o M with protein S deficiency and SMV thrombus on Eliquis PTA, presented to the ED on 7/26 with c/o abdominal pain.  He was found to have acute appendicitis and underwent appendectomy on 7/27.  Heparin drip started 6 hours after surgical procedure.  He subsequently had hematemesis on 7/29 with heparin drip stopped at ~5am and resumed back at ~1:30p.  - heparin level is sub-therapeutic at 0.20 (goal 0.3-0.7) - per pt's RN, no issues with IV line and no new bleeding noted  Plan: - increase heparin drip to 1900 units/hr - check 6 hr heparin level - monitor for s/sx bleeding  Dia Sitter, PharmD, BCPS 05/13/2020 8:28 PM

## 2020-05-13 NOTE — Progress Notes (Addendum)
2 Days Post-Op  Subjective: CC: n/v Patient NGT came out yesterday. Became nauseated overnight with hypersalivation and had an episode of hematemesis overnight. Still nauseated with some distension. 3/10 suprapubic abdominal pain. No flatus or bm. Ambulating in the room but not in the halls.   Objective: Vital signs in last 24 hours: Temp:  [98.1 F (36.7 C)-99 F (37.2 C)] 98.8 F (37.1 C) (07/29 0610) Pulse Rate:  [53-105] 69 (07/29 0610) Resp:  [18] 18 (07/29 0610) BP: (131-177)/(72-110) 156/72 (07/29 0610) SpO2:  [98 %-100 %] 100 % (07/29 0610) Last BM Date: 05/08/20  Intake/Output from previous day: 07/28 0701 - 07/29 0700 In: 1507.3 [P.O.:10; I.V.:953.8; IV Piggyback:543.6] Out: 675 [Urine:575; Emesis/NG output:100] Intake/Output this shift: No intake/output data recorded.  PE: Gen:  Alert, NAD, pleasant Pulm:  Normal rate and effort  Abd: Soft, moderate distension, tenderness around laparoscopic incisions and in the periumbilical/suprapubic abdomen and RLQ. No rebound, rigidity or guarding. Hypoactive bowel sounds. Incisions with glue intact appears well and are without drainage, bleeding, or signs of infection Ext:  No LE edema  Psych: A&Ox3  Skin: no rashes noted, warm and dry  Lab Results:  Recent Labs    05/12/20 0458 05/13/20 0422  WBC 6.7 8.2  HGB 13.1 13.5  HCT 39.6 40.4  PLT 81* 110*   BMET Recent Labs    05/12/20 0458 05/13/20 0422  NA 137 138  K 3.1* 3.0*  CL 100 100  CO2 25 23  GLUCOSE 147* 124*  BUN 7 10  CREATININE 0.86 0.94  CALCIUM 8.9 9.1   PT/INR No results for input(s): LABPROT, INR in the last 72 hours. CMP     Component Value Date/Time   NA 138 05/13/2020 0422   K 3.0 (L) 05/13/2020 0422   CL 100 05/13/2020 0422   CO2 23 05/13/2020 0422   GLUCOSE 124 (H) 05/13/2020 0422   BUN 10 05/13/2020 0422   CREATININE 0.94 05/13/2020 0422   CREATININE 1.14 05/05/2020 1614   CALCIUM 9.1 05/13/2020 0422   PROT 8.7 (H)  05/10/2020 0149   ALBUMIN 5.4 (H) 05/10/2020 0149   AST 27 05/10/2020 0149   AST 303 (HH) 08/16/2018 1150   ALT 22 05/10/2020 0149   ALT 543 (HH) 08/16/2018 1150   ALKPHOS 46 05/10/2020 0149   BILITOT 1.0 05/10/2020 0149   BILITOT 10.3 (HH) 08/16/2018 1150   GFRNONAA >60 05/13/2020 0422   GFRNONAA 88 05/05/2020 1614   GFRAA >60 05/13/2020 0422   GFRAA 102 05/05/2020 1614   Lipase     Component Value Date/Time   LIPASE 26 05/10/2020 0149       Studies/Results: DG Abd Portable 1V  Result Date: 05/13/2020 CLINICAL DATA:  Ileus EXAM: PORTABLE ABDOMEN - 1 VIEW COMPARISON:  May 12, 2020 FINDINGS: Nasogastric tube no longer appreciable. There remain multiple loops of dilated bowel without appreciable air-fluid level. No free air. Lung bases clear. IMPRESSION: Persistent dilatation of multiple loops of small bowel. Nasogastric tube no longer evident. Suspect ileus, although a degree of bowel obstruction cannot be excluded on this study. No free air evident. Lung bases clear. Electronically Signed   By: Lowella Grip III M.D.   On: 05/13/2020 07:11   DG Abd Portable 1V  Result Date: 05/12/2020 CLINICAL DATA:  Encounter for imaging study to confirm nasogastric (NG) tube placement EXAM: PORTABLE ABDOMEN - 1 VIEW COMPARISON:  04/28/2018 FINDINGS: Nasogastric tube passes well below the diaphragm, curling within the proximal to mid stomach.  There is increased bowel gas with mild small bowel dilation. Small-bowel dilation is similar to the prior study. IMPRESSION: Well-positioned nasogastric tube. Electronically Signed   By: Lajean Manes M.D.   On: 05/12/2020 11:37    Anti-infectives: Anti-infectives (From admission, onward)   Start     Dose/Rate Route Frequency Ordered Stop   05/11/20 1800  piperacillin-tazobactam (ZOSYN) IVPB 3.375 g     Discontinue     3.375 g 12.5 mL/hr over 240 Minutes Intravenous Every 8 hours 05/11/20 1625     05/11/20 1000  cefTRIAXone (ROCEPHIN) 2 g in sodium  chloride 0.9 % 100 mL IVPB  Status:  Discontinued       "And" Linked Group Details   2 g 200 mL/hr over 30 Minutes Intravenous Every 24 hours 05/10/20 1350 05/11/20 1540   05/10/20 1800  metroNIDAZOLE (FLAGYL) IVPB 500 mg  Status:  Discontinued       "And" Linked Group Details   500 mg 100 mL/hr over 60 Minutes Intravenous Every 8 hours 05/10/20 1350 05/11/20 1540   05/10/20 0915  cefTRIAXone (ROCEPHIN) 2 g in sodium chloride 0.9 % 100 mL IVPB       "And" Linked Group Details   2 g 200 mL/hr over 30 Minutes Intravenous  Once 05/10/20 0902 05/10/20 1223   05/10/20 0915  metroNIDAZOLE (FLAGYL) IVPB 500 mg       "And" Linked Group Details   500 mg 100 mL/hr over 60 Minutes Intravenous  Once 05/10/20 0902 05/10/20 1115       Assessment/Plan HTN HLD Grave's disease 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 Armbrusterper PCP's note  Protein S deficiency HxSMV thrombosis on Eliquis - Per [facility], appreciate their assistance with this patient -  Chronic ruptured appendicitis with abscess and unrecognizable structures involving the urachus and pelvis. - s/p Laparoscopic debridement of abscess containing yellow pus and thick fibrinous exudates by Dr. Hassell Done on 05/11/2020 - POD #2 - Ileus. Replace NGT. Hematemesis overnight. Continue PPI. Hgb stable. Okay to restart Heparin. If no improvement of ileus in the next few days will likely obtain CT scan Sunday/Monday  - Continue abx - Mobilize (in halls) - IS - Keep K >4 and Mg > 2 for bowel function  - Path w disrupted fibromuscular tissue with marked acute inflammation, consistent with clinically stated appendix with appendicitis   FEN -NPO, NGT, IVF, replace K VTE -SCDs, please hold Eliquis- okay to restart IV Heparin  ID -Rocephin/Flagyl 7/26 - 7/27. Zosyn 7/27 >> Foley - None Follow-Up - Dr. Hassell Done   LOS: 3 days    Jillyn Ledger , [facility] 05/13/2020, 8:23 AM Please  see Amion for pager number during day hours 7:00am-4:30pm

## 2020-05-13 NOTE — Progress Notes (Signed)
Pt c/o nausea.  Assisted pt to BR where he vomited medium amt of bright red blood in toilet.  Heparin IV stopped.  VS- 98.1; 105; 18; 156/110 99% RA.  Dr. Marcello Moores paged.

## 2020-05-14 ENCOUNTER — Inpatient Hospital Stay (HOSPITAL_COMMUNITY): Payer: Self-pay

## 2020-05-14 LAB — BASIC METABOLIC PANEL
Anion gap: 8 (ref 5–15)
BUN: 8 mg/dL (ref 6–20)
CO2: 26 mmol/L (ref 22–32)
Calcium: 8.3 mg/dL — ABNORMAL LOW (ref 8.9–10.3)
Chloride: 100 mmol/L (ref 98–111)
Creatinine, Ser: 0.88 mg/dL (ref 0.61–1.24)
GFR calc Af Amer: >60 mL/min (ref >60)
GFR calc non Af Amer: >60 mL/min (ref >60)
Glucose, Bld: 114 mg/dL — ABNORMAL HIGH (ref 70–99)
Potassium: 3 mmol/L — ABNORMAL LOW (ref 3.5–5.1)
Sodium: 134 mmol/L — ABNORMAL LOW (ref 135–145)

## 2020-05-14 LAB — CBC
HCT: 36.9 % — ABNORMAL LOW (ref 39.0–52.0)
Hemoglobin: 12.4 g/dL — ABNORMAL LOW (ref 13.0–17.0)
MCH: 30.5 pg (ref 26.0–34.0)
MCHC: 33.6 g/dL (ref 30.0–36.0)
MCV: 90.9 fL (ref 80.0–100.0)
Platelets: 107 10*3/uL — ABNORMAL LOW (ref 150–400)
RBC: 4.06 MIL/uL — ABNORMAL LOW (ref 4.22–5.81)
RDW: 13.3 % (ref 11.5–15.5)
WBC: 6 10*3/uL (ref 4.0–10.5)
nRBC: 0 % (ref 0.0–0.2)

## 2020-05-14 LAB — HEPARIN LEVEL (UNFRACTIONATED)
Heparin Unfractionated: 0.45 IU/mL (ref 0.30–0.70)
Heparin Unfractionated: 0.46 IU/mL (ref 0.30–0.70)

## 2020-05-14 LAB — PREALBUMIN: Prealbumin: 9.9 mg/dL — ABNORMAL LOW (ref 18–38)

## 2020-05-14 MED ORDER — MORPHINE SULFATE (PF) 2 MG/ML IV SOLN
1.0000 mg | INTRAVENOUS | Status: DC | PRN
  Administered 2020-05-14 – 2020-05-18 (×17): 1 mg via INTRAVENOUS
  Filled 2020-05-14 (×18): qty 1

## 2020-05-14 MED ORDER — METHOCARBAMOL 1000 MG/10ML IJ SOLN
500.0000 mg | Freq: Four times a day (QID) | INTRAVENOUS | Status: DC | PRN
  Filled 2020-05-14: qty 5

## 2020-05-14 MED ORDER — POTASSIUM CHLORIDE 10 MEQ/100ML IV SOLN
10.0000 meq | INTRAVENOUS | Status: AC
  Administered 2020-05-14 (×4): 10 meq via INTRAVENOUS
  Filled 2020-05-14 (×4): qty 100

## 2020-05-14 NOTE — Progress Notes (Signed)
NG tube turned on for 2 hours beginning now.

## 2020-05-14 NOTE — Progress Notes (Signed)
Discontinued NG suction and heparin due to blood in NG tube per Dr. Tana Coast.

## 2020-05-14 NOTE — Progress Notes (Signed)
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 Tylenol [Acetaminophen] Other (See Comments)    r/t elevated LFTs     Patient Measurements: Height: 6\' 3"  (190.5 cm) Weight: (!) 110.3 kg (243 lb 3.2 oz) IBW/kg (Calculated) : 84.5 Heparin Dosing Weight: 107 kg  Vital Signs: Temp: 97.4 F (36.3 C) (07/30 0626) Temp Source: Oral (07/30 0626) BP: 155/83 (07/30 0626) Pulse Rate: 55 (07/30 0626)  Labs: Recent Labs    05/12/20 0458 05/12/20 1319 05/13/20 0422 05/13/20 0422 05/13/20 1946 05/14/20 0359 05/14/20 1032  HGB 13.1  --  13.5  --   --  12.4*  --   HCT 39.6  --  40.4  --   --  36.9*  --   PLT 81*  --  110*  --   --  107*  --   APTT 64*  --   --   --   --   --   --   HEPARINUNFRC 0.27*   < > 0.27*   < > 0.20* 0.45 0.46  CREATININE 0.86  --  0.94  --   --  0.88  --    < > = values in this interval not displayed.    Estimated Creatinine Clearance: 169.1 mL/min (by C-G formula based on SCr of 0.88 mg/dL).  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 Found to have acute appendicitis and Eliquis held to allow for appendectomy. Pharmacy to bridge with heparin perioperatively.    Baseline heparin level slightly elevated at 0.46 as anticipated due to lab-drug interaction with apixaban, aPTT WNL at 23  Prior anticoagulation: Eliquis 5 mg PO bid, last dose 7/25 AM  Significant events:  7/27 s/pLaparoscopic debridement of abscess containing yellow pus and thick fibrinous exudatesby Dr. Hassell Done - heparin resumed 6 hrs after surgery w/ no bolus 7/29 AM: vomited medium amt of bright red blood in toilet.  Heparin IV stopped at 04 AM- OK to resume today w/ no bolus per [facility]  Today, 05/14/2020:  1030 AM confirmatory HL = 0.46 remains therapeutic on 1900 units/hr- no bleeding  reported  CBC: Hg 12.4> ABL anemia, Pltc 204 on admit> low of 81> 106 today>> Fairly rapid decrease in pltc however patient's baseline appears to be 130s-150s.  He was on IV heparin infusion 7/15>>7/17 so could see heparin-induced thrombocytopenia sooner than typical timeframe of 5-7 days after initiation.  Goal of Therapy: Heparin level 0.3-0.7 units/ml Monitor platelets by anticoagulation protocol: Yes  Plan:  contiue IV heparin at 1900 units/hr  Daily CBC and heparin level  Monitor for signs of bleeding or thrombosis  F/u resolution of ileus and ability to resume PTA Eliquis  Eudelia Bunch, Pharm.D 05/14/2020 11:19 AM

## 2020-05-14 NOTE — Progress Notes (Addendum)
[facility] Progress Note  3 Days Post-Op  Subjective: CC-  Mother at bedside. Main complaint is NG tube. States that his abdomen is still sore but pain well controlled. Mild bloating. Denies n/v. No flatus or BM. NG tube with 1150cc out last 24 hours. He is ambulating in his room and getting up to rocking chair.  Objective: Vital signs in last 24 hours: Temp:  [97.4 F (36.3 C)-99.1 F (37.3 C)] 97.4 F (36.3 C) (07/30 0626) Pulse Rate:  [55-56] 55 (07/30 0626) Resp:  [19-20] 19 (07/30 0626) BP: (142-155)/(83-95) 155/83 (07/30 0626) SpO2:  [99 %-100 %] 99 % (07/30 0626) Last BM Date: 05/08/20  Intake/Output from previous day: 07/29 0701 - 07/30 0700 In: 2623.8 [I.V.:2523.9; IV Piggyback:99.9] Out: 1151 [Urine:1; Emesis/NG output:1150] Intake/Output this shift: No intake/output data recorded.  PE: Gen:  Alert, NAD, pleasant HEENT: EOM's intact, pupils equal and round Card:  RRR Pulm:  CTAB, no W/R/R, rate and effort normal Abd: Soft, mild distension, hypoactive BS, lap incisions cdi, TTP central abdomen and around incisions/ no rebound or guarding  Lab Results:  Recent Labs    05/13/20 0422 05/14/20 0359  WBC 8.2 6.0  HGB 13.5 12.4*  HCT 40.4 36.9*  PLT 110* 107*   BMET Recent Labs    05/13/20 0422 05/14/20 0359  NA 138 134*  K 3.0* 3.0*  CL 100 100  CO2 23 26  GLUCOSE 124* 114*  BUN 10 8  CREATININE 0.94 0.88  CALCIUM 9.1 8.3*   PT/INR No results for input(s): LABPROT, INR in the last 72 hours. CMP     Component Value Date/Time   NA 134 (L) 05/14/2020 0359   K 3.0 (L) 05/14/2020 0359   CL 100 05/14/2020 0359   CO2 26 05/14/2020 0359   GLUCOSE 114 (H) 05/14/2020 0359   BUN 8 05/14/2020 0359   CREATININE 0.88 05/14/2020 0359   CREATININE 1.14 05/05/2020 1614   CALCIUM 8.3 (L) 05/14/2020 0359   PROT 8.7 (H) 05/10/2020 0149   ALBUMIN 5.4 (H) 05/10/2020 0149   AST 27 05/10/2020 0149   AST 303 (HH) 08/16/2018 1150   ALT 22  05/10/2020 0149   ALT 543 (HH) 08/16/2018 1150   ALKPHOS 46 05/10/2020 0149   BILITOT 1.0 05/10/2020 0149   BILITOT 10.3 (HH) 08/16/2018 1150   GFRNONAA >60 05/14/2020 0359   GFRNONAA 88 05/05/2020 1614   GFRAA >60 05/14/2020 0359   GFRAA 102 05/05/2020 1614   Lipase     Component Value Date/Time   LIPASE 26 05/10/2020 0149       Studies/Results: DG Abd Portable 1V  Result Date: 05/13/2020 CLINICAL DATA:  NG tube placement EXAM: PORTABLE ABDOMEN - 1 VIEW COMPARISON:  05/13/2020 FINDINGS: NG tube coils in the stomach. IMPRESSION: NG tube in the stomach. Electronically Signed   By: Rolm Baptise M.D.   On: 05/13/2020 09:52   DG Abd Portable 1V  Result Date: 05/13/2020 CLINICAL DATA:  Ileus EXAM: PORTABLE ABDOMEN - 1 VIEW COMPARISON:  May 12, 2020 FINDINGS: Nasogastric tube no longer appreciable. There remain multiple loops of dilated bowel without appreciable air-fluid level. No free air. Lung bases clear. IMPRESSION: Persistent dilatation of multiple loops of small bowel. Nasogastric tube no longer evident. Suspect ileus, although a degree of bowel obstruction cannot be excluded on this study. No free air evident. Lung bases clear. Electronically Signed   By: Lowella Grip III M.D.   On: 05/13/2020 07:11   DG Abd Portable 1V  Result Date: 05/12/2020 CLINICAL DATA:  Encounter for imaging study to confirm nasogastric (NG) tube placement EXAM: PORTABLE ABDOMEN - 1 VIEW COMPARISON:  04/28/2018 FINDINGS: Nasogastric tube passes well below the diaphragm, curling within the proximal to mid stomach. There is increased bowel gas with mild small bowel dilation. Small-bowel dilation is similar to the prior study. IMPRESSION: Well-positioned nasogastric tube. Electronically Signed   By: Lajean Manes M.D.   On: 05/12/2020 11:37    Anti-infectives: Anti-infectives (From admission, onward)   Start     Dose/Rate Route Frequency Ordered Stop   05/11/20 1800  piperacillin-tazobactam (ZOSYN)  IVPB 3.375 g     Discontinue     3.375 g 12.5 mL/hr over 240 Minutes Intravenous Every 8 hours 05/11/20 1625     05/11/20 1000  cefTRIAXone (ROCEPHIN) 2 g in sodium chloride 0.9 % 100 mL IVPB  Status:  Discontinued       "And" Linked Group Details   2 g 200 mL/hr over 30 Minutes Intravenous Every 24 hours 05/10/20 1350 05/11/20 1540   05/10/20 1800  metroNIDAZOLE (FLAGYL) IVPB 500 mg  Status:  Discontinued       "And" Linked Group Details   500 mg 100 mL/hr over 60 Minutes Intravenous Every 8 hours 05/10/20 1350 05/11/20 1540   05/10/20 0915  cefTRIAXone (ROCEPHIN) 2 g in sodium chloride 0.9 % 100 mL IVPB       "And" Linked Group Details   2 g 200 mL/hr over 30 Minutes Intravenous  Once 05/10/20 0902 05/10/20 1223   05/10/20 0915  metroNIDAZOLE (FLAGYL) IVPB 500 mg       "And" Linked Group Details   500 mg 100 mL/hr over 60 Minutes Intravenous  Once 05/10/20 0902 05/10/20 1115       Assessment/Plan HTN HLD 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 -s/pLaparoscopic debridement of abscess containing yellow pus and thick fibrinous exudatesby Dr. Hassell Done on 05/11/2020 - POD #3 - persistent ileus - Continue abx - Mobilize (in halls) - IS - Keep K >4 and Mg > 2 for bowel function  - Path w disrupted fibromuscular tissue with marked acute inflammation, consistent with clinically stated appendix with appendicitis   FEN -NPO, NGT, IVF, replace K VTE -SCDs, IV Heparin, continue to hold eliquis ID -Rocephin/Flagyl7/26 - 7/27. Zosyn 7/27 >>day#4 Foley - None Follow-Up- Dr. Hassell Done    Plan: Continue NPO/NGT and await return in bowel function. Continue mobilizing, minimize narcotics. Monitor electrolytes, K is being replaced. Continue IV antibiotics. If no improvement of ileus in the next few days will likely obtain CT scan Sunday/Monday, or sooner if any issues. Check prealbumin.   Prealbumin 9.9.  NG has nothing but ice chips in it.  He is passing gas.  Will try clamping trial, mobilize more and see how he does.  Hold off on TPN today.  He did have some blood in NG, but it just looks like irritation.  Hopefully we can get the NG out soon.   LOS: 4 days    [facility] 05/14/2020, 9:01 AM Please see Amion for pager number during day hours 7:00am-4:30pm

## 2020-05-14 NOTE — Progress Notes (Signed)
Brief Pharmacy Consult Note - IV Heparin  Labs: heparin level 0.45  A/P: heparin level therapeutic (goal 0.3-0.7) on current IV heparin rate of 1900 units/hr. NO reported bleeding. Continue current IV heparin rate. Will recheck heparin level in 6 hours as confirmatory level on current IV heparin rate  Adrian Saran, PharmD, BCPS 05/14/2020 4:31 AM

## 2020-05-14 NOTE — Progress Notes (Signed)
[facility]                                                                              Patient Demographics  Kyle Gonzales, is a 29 y.o. male, DOB - Dec 25, 1990, IWP:[id number]  Admit date - 05/10/2020   Admitting Physician Harold Hedge, MD  Outpatient Primary MD for the patient is Kyle Pinto, MD  Outpatient specialists:   LOS - 4  days   Medical records reviewed and are as summarized below:    Chief Complaint  Patient presents with  . Abdominal Pain       Brief summary   Patient is a 29 year old male with past medical history of primary sclerosing cholangitis follows with Dr. 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 CT abdomen pelvis at [facility] acute nonperforated appendicitis. He was given ceftriaxone and Flagyl transferred to [facility] after ED physician had discussion with Dr. Hassell Done, general surgery on-call.    Eliquis was held and patient was admitted for further management.  Assessment & Plan    Principal Problem: Acute appendicitis without perforation -With underlying chronic ruptured appendicitis with abscess  -Patient was placed on n.p.o. status with IV fluids, IV antibiotics -General surgery was consulted, patient underwent laparoscopic debridement of the abscess containing yellow pus and thick fibrinous exudates, Dr. Hassell Done.  Postop day # 3 -at the time of my evaluation, ambulating, states started having flatus, NG was clamped, abdominal x-ray showed slight decrease gaseous distention of small bowel, order Dulcolax suppository -Called by RN for bleeding during NG suction.  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 -Creatinine 0.8, was 1.29 on admission  Hypokalemia Replaced  History of Graves' disease status post Anjeli Casad ablation now with acquired hypothyroidism -TSH 9.45, on 7/21.  Continue Synthroid 125 MCG, appears to have been increased at admission -Patient on n.p.o. status, NGT to small suction, continue IV Synthroid   Hyperlipidemia Holding home medications.  Thrombocytopenia Platelets improving, follow closely, on heparin drip  History of primary sclerosing cholangitis Chronic changes noted on CT scan.  Patient is followed by [facility] gastroenterology.   Obesity Estimated body mass index is 30.4 kg/m as calculated from the following:   Height as of this encounter: 6\' 3"  (1.905 m).   Weight as of this encounter: 110.3 kg.  Code Status: Full CODE STATUS DVT Prophylaxis: Heparin drip Family Communication: Discussed all imaging results, lab results, explained to the patient and mother at the bedside   Disposition Plan:     Status is: Inpatient  Remains inpatient appropriate because:IV treatments appropriate due to  intensity of illness or inability to take PO   Dispo:  Patient From: Home  Planned Disposition: Home  Expected discharge date: 05/13/20  Medically stable for discharge: No, still with ileus       Time Spent in minutes   25 minutes  Procedures:   laparoscopic debridement of the abscess*7/27  Consultants:   General surgery  Antimicrobials:   Anti-infectives (From admission, onward)   Start     Dose/Rate Route Frequency Ordered Stop   05/11/20 1800  piperacillin-tazobactam  (ZOSYN) IVPB 3.375 g     Discontinue     3.375 g 12.5 mL/hr over 240 Minutes Intravenous Every 8 hours 05/11/20 1625     05/11/20 1000  cefTRIAXone (ROCEPHIN) 2 g in sodium chloride 0.9 % 100 mL IVPB  Status:  Discontinued       "And" Linked Group Details   2 g 200 mL/hr over 30 Minutes Intravenous Every 24 hours 05/10/20 1350 05/11/20 1540   05/10/20 1800  metroNIDAZOLE (FLAGYL) IVPB 500 mg  Status:  Discontinued       "And" Linked Group Details   500 mg 100 mL/hr over 60 Minutes Intravenous Every 8 hours 05/10/20 1350 05/11/20 1540   05/10/20 0915  cefTRIAXone (ROCEPHIN) 2 g in sodium chloride 0.9 % 100 mL IVPB       "And" Linked Group Details   2 g 200 mL/hr over 30 Minutes Intravenous  Once 05/10/20 0902 05/10/20 1223   05/10/20 0915  metroNIDAZOLE (FLAGYL) IVPB 500 mg       "And" Linked Group Details   500 mg 100 mL/hr over 60 Minutes Intravenous  Once 05/10/20 0902 05/10/20 1115         Medications  Scheduled Meds: . busPIRone  10 mg Oral TID  . levothyroxine  50 mcg Intravenous Q0600  . metoprolol tartrate  5 mg Intravenous Q6H  . pantoprazole (PROTONIX) IV  40 mg Intravenous BID   Continuous Infusions: . dextrose 5 % and 0.45 % NaCl with KCl 20 mEq/L 100 mL/hr at 05/14/20 0127  . heparin 1,900 Units/hr (05/14/20 0934)  . methocarbamol (ROBAXIN) IV    . piperacillin-tazobactam (ZOSYN)  IV 3.375 g (05/14/20 0930)   PRN Meds:.hydrALAZINE, menthol-cetylpyridinium, methocarbamol (ROBAXIN) IV, morphine injection, ondansetron **OR** ondansetron (ZOFRAN) IV, phenol, prochlorperazine      Subjective:   Kyle Gonzales was seen and examined today.  Ambulating in the room at the time of my eval, stated had started passing flatus.  No BM.  NGT was clamped.  No abdominal pain, fevers  Objective:   Vitals:   05/13/20 2151 05/14/20 0355 05/14/20 0626 05/14/20 1343  BP: (!) 150/95 (!) 153/91 (!) 155/83 (!) 161/99  Pulse: 55 55 55 81  Resp: 20  19 16   Temp: 99.1 F (37.3  C)  (!) 97.4 F (36.3 C) 97.8 F (36.6 C)  TempSrc: Oral  Oral   SpO2: 100%  99% 100%  Weight:      Height:        Intake/Output Summary (Last 24 hours) at 05/14/2020 1404 Last data filed at 05/14/2020 1209 Gross per 24 hour  Intake 2394.36 ml  Output 552 ml  Net 1842.36 ml     Wt Readings from Last 3 Encounters:  05/10/20 (!) 110.3 kg  05/05/20 110.8 kg  11/18/19 104.6 kg    Physical Exam  General: Alert and oriented x 3, NAD, anxious and tearful  Cardiovascular: S1 S2 clear, RRR. No pedal edema b/l  Respiratory: CTAB, no  wheezing, rales or rhonchi  Gastrointestinal: Soft, still distended, still hypoactive bowel sounds  Ext: no pedal edema bilaterally  Neuro: no new deficits  Musculoskeletal: No cyanosis, clubbing  Skin: No rashes  Psych: anxious     Data Reviewed:  I have personally reviewed following labs and imaging studies  Micro Results Recent Results (from the past 240 hour(s))  SARS Coronavirus 2 by RT PCR (hospital order, performed in [facility] hospital lab) Nasopharyngeal Nasopharyngeal Swab     Status: None   Collection Time: 05/10/20  9:31 AM   Specimen: Nasopharyngeal Swab  Result Value Ref Range Status   SARS Coronavirus 2 NEGATIVE NEGATIVE Final    Comment: (NOTE) SARS-CoV-2 target nucleic acids are NOT DETECTED.  The SARS-CoV-2 RNA is generally detectable in upper and lower respiratory specimens during the acute phase of infection. The lowest concentration of SARS-CoV-2 viral copies this assay can detect is 250 copies / mL. A negative result does not preclude SARS-CoV-2 infection and should not be used as the sole basis for treatment or other patient management decisions.  A negative result may occur with improper specimen collection / handling, submission of specimen other than nasopharyngeal swab, presence of viral mutation(s) within the areas targeted by this assay, and inadequate number of viral copies (<250 copies / mL). A  negative result must be combined with clinical observations, patient history, and epidemiological information.  Fact Sheet for Patients:   StrictlyIdeas.no  Fact Sheet for Healthcare Providers: BankingDealers.co.za  This test is not yet approved or  cleared by the [place] FDA and has been authorized for detection and/or diagnosis of SARS-CoV-2 by FDA under an Emergency Use Authorization (EUA).  This EUA will remain in effect (meaning this test can be used) for the duration of the COVID-19 declaration under Section 564(b)(1) of the Act, 21 U.S.C. section 360bbb-3(b)(1), unless the authorization is terminated or revoked sooner.  Performed at [facility], [address], [place], [place] [address]   Surgical pcr screen     Status: None   Collection Time: 05/11/20  1:22 AM   Specimen: Nasal Mucosa; Nasal Swab  Result Value Ref Range Status   MRSA, PCR NEGATIVE NEGATIVE Final   Staphylococcus aureus NEGATIVE NEGATIVE Final    Comment: (NOTE) The Xpert SA Assay (FDA approved for NASAL specimens in patients 72 years of age and older), is one component of a comprehensive surveillance program. It is not intended to diagnose infection nor to guide or monitor treatment. Performed at [facility], [place] [address], [place], [place] [address]     Radiology Reports CT Abdomen Pelvis W Contrast  Result Date: 05/10/2020 CLINICAL DATA:  29 year old male with history of acute onset of left lower quadrant tenderness and left-sided flank pain. Leukocytosis. History of primary sclerosing cholangitis. EXAM: CT ABDOMEN AND PELVIS WITH CONTRAST TECHNIQUE: Multidetector CT imaging of the abdomen and pelvis was performed using the standard protocol following bolus administration of intravenous contrast. CONTRAST:  151mL OMNIPAQUE IOHEXOL 300 MG/ML  SOLN COMPARISON:  CT the abdomen and pelvis 07/05/2018. FINDINGS: Lower  chest: Unremarkable. Hepatobiliary: Liver has a irregular contour, and there is moderate intrahepatic biliary ductal dilatation, increased slightly compared to prior studies, compatible with reported clinical history of primary sclerosing cholangitis. In segment 7 of the liver there is a 2.6 x 2.0 cm intermediate attenuation lesion (axial image 22 of series 2). In segment 5 of the liver there is a 1.5 x 1.4 cm intermediate attenuation lesion (axial  image 30 of series 2). Both of these lesions were previously characterized as cavernous hemangiomas on prior abdominal MRI 08/16/2018. No other suspicious hepatic lesions. Status post cholecystectomy. Cavernous transformation in the porta hepatis. Common bile duct is normal in caliber. Pancreas: No pancreatic mass. No pancreatic ductal dilatation. No pancreatic or peripancreatic fluid collections or inflammatory changes. Spleen: Unremarkable. Adrenals/Urinary Tract: Bilateral kidneys and adrenal glands are normal in appearance. No hydroureteronephrosis. Urinary bladder is normal in appearance. Stomach/Bowel: Normal appearance of the stomach. No pathologic dilatation of small bowel or colon. The appendix is dilated and inflamed, concerning for an acute appendicitis. Appendix: Location: Low anatomic pelvis Diameter: 2.0 cm Appendicolith: None Mucosal hyper-enhancement: Present Extraluminal gas: None Periappendiceal collection: None Vascular/Lymphatic: No significant atherosclerotic disease, aneurysm or dissection noted in the abdominal or pelvic vasculature. 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 No lymphadenopathy noted in the abdomen or pelvis. Reproductive: Prostate gland and seminal vesicles are unremarkable in appearance. Other: No significant volume of ascites.  No pneumoperitoneum. Musculoskeletal: There are no aggressive appearing lytic or blastic lesions noted in the visualized portions of the skeleton.  IMPRESSION: 1. Findings are compatible with acute appendicitis. No periappendiceal abscess or signs of frank perforation are noted at this time. Surgical consultation is recommended. 2. Chronic changes related to primary sclerosing cholangitis redemonstrated with increased intrahepatic biliary ductal dilatation, as above. 3. 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 4. 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 Electronically Signed   By: Vinnie Langton M.D.   On: 05/10/2020 08:33   DG Abd Portable 1V  Result Date: 05/14/2020 CLINICAL DATA:  Ileus EXAM: PORTABLE ABDOMEN - 1 VIEW COMPARISON:  05/13/2020 FINDINGS: Difficult on this single image centered low to determine exact position of the NG tube. Continued gaseous distention of bowel, with slight decreased small bowel distention since prior study. No organomegaly or suspicious calcification. IMPRESSION: Slight decreased gaseous distention of small bowel since prior study. Electronically Signed   By: Rolm Baptise M.D.   On: 05/14/2020 10:20   DG Abd Portable 1V  Result Date: 05/13/2020 CLINICAL DATA:  NG tube placement EXAM: PORTABLE ABDOMEN - 1 VIEW COMPARISON:  05/13/2020 FINDINGS: NG tube coils in the stomach. IMPRESSION: NG tube in the stomach. Electronically Signed   By: Rolm Baptise M.D.   On: 05/13/2020 09:52   DG Abd Portable 1V  Result Date: 05/13/2020 CLINICAL DATA:  Ileus EXAM: PORTABLE ABDOMEN - 1 VIEW COMPARISON:  May 12, 2020 FINDINGS: Nasogastric tube no longer appreciable. There remain multiple loops of dilated bowel without appreciable air-fluid level. No free air. Lung bases clear. IMPRESSION: Persistent dilatation of multiple loops of small bowel. Nasogastric tube no longer evident. Suspect ileus, although a degree of bowel obstruction cannot be excluded on this study. No free air evident. Lung bases clear. Electronically Signed   By:  Lowella Grip III M.D.   On: 05/13/2020 07:11   DG Abd Portable 1V  Result Date: 05/12/2020 CLINICAL DATA:  Encounter for imaging study to confirm nasogastric (NG) tube placement EXAM: PORTABLE ABDOMEN - 1 VIEW COMPARISON:  04/28/2018 FINDINGS: Nasogastric tube passes well below the diaphragm, curling within the proximal to mid stomach. There is increased bowel gas with mild small bowel dilation. Small-bowel dilation is similar to the prior study. IMPRESSION: Well-positioned nasogastric tube. Electronically Signed   By: Lajean Manes M.D.   On: 05/12/2020 11:37  Lab Data:  CBC: Recent Labs  Lab 05/10/20 0149 05/11/20 0041 05/12/20 0458 05/13/20 0422 05/14/20 0359  WBC 16.7* 8.4 6.7 8.2 6.0  HGB 14.7 14.3 13.1 13.5 12.4*  HCT 44.2 43.0 39.6 40.4 36.9*  MCV 91.5 92.5 92.3 92.0 90.9  PLT 204 103* 81* 110* 599*   Basic Metabolic Panel: Recent Labs  Lab 05/10/20 0149 05/11/20 0041 05/12/20 0458 05/13/20 0422 05/14/20 0359  NA 137 139 137 138 134*  K 3.1* 3.1* 3.1* 3.0* 3.0*  CL 101 103 100 100 100  CO2 21* 24 25 23 26   GLUCOSE 158* 122* 147* 124* 114*  BUN 13 9 7 10 8   CREATININE 1.29* 1.01 0.86 0.94 0.88  CALCIUM 9.7 9.0 8.9 9.1 8.3*  MG  --  1.7 1.9 2.1  --    GFR: Estimated Creatinine Clearance: 169.1 mL/min (by C-G formula based on SCr of 0.88 mg/dL). Liver Function Tests: Recent Labs  Lab 05/10/20 0149  AST 27  ALT 22  ALKPHOS 46  BILITOT 1.0  PROT 8.7*  ALBUMIN 5.4*   Recent Labs  Lab 05/10/20 0149  LIPASE 26   No results for input(s): AMMONIA in the last 168 hours. Coagulation Profile: No results for input(s): INR, PROTIME in the last 168 hours. Cardiac Enzymes: No results for input(s): CKTOTAL, CKMB, CKMBINDEX, TROPONINI in the last 168 hours. BNP (last 3 results) No results for input(s): PROBNP in the last 8760 hours. HbA1C: No results for input(s): HGBA1C in the last 72 hours. CBG: No results for input(s): GLUCAP in the last 168  hours. Lipid Profile: No results for input(s): CHOL, HDL, LDLCALC, TRIG, CHOLHDL, LDLDIRECT in the last 72 hours. Thyroid Function Tests: No results for input(s): TSH, T4TOTAL, FREET4, T3FREE, THYROIDAB in the last 72 hours. Anemia Panel: No results for input(s): VITAMINB12, FOLATE, FERRITIN, TIBC, IRON, RETICCTPCT in the last 72 hours. Urine analysis:    Component Value Date/Time   COLORURINE YELLOW 05/10/2020 0901   APPEARANCEUR CLEAR 05/10/2020 0901   LABSPEC 1.010 05/10/2020 0901   PHURINE 8.0 05/10/2020 0901   GLUCOSEU NEGATIVE 05/10/2020 0901   HGBUR MODERATE (A) 05/10/2020 0901   BILIRUBINUR NEGATIVE 05/10/2020 0901   KETONESUR 40 (A) 05/10/2020 0901   PROTEINUR NEGATIVE 05/10/2020 0901   NITRITE NEGATIVE 05/10/2020 0901   LEUKOCYTESUR NEGATIVE 05/10/2020 0901     Olly Shiner M.D. [facility] 05/14/2020, 2:04 PM   Call night coverage person covering after 7pm

## 2020-05-15 ENCOUNTER — Inpatient Hospital Stay (HOSPITAL_COMMUNITY): Payer: Self-pay

## 2020-05-15 LAB — BASIC METABOLIC PANEL
Anion gap: 11 (ref 5–15)
BUN: 6 mg/dL (ref 6–20)
CO2: 24 mmol/L (ref 22–32)
Calcium: 9.1 mg/dL (ref 8.9–10.3)
Chloride: 102 mmol/L (ref 98–111)
Creatinine, Ser: 0.86 mg/dL (ref 0.61–1.24)
GFR calc Af Amer: >60 mL/min (ref >60)
GFR calc non Af Amer: >60 mL/min (ref >60)
Glucose, Bld: 117 mg/dL — ABNORMAL HIGH (ref 70–99)
Potassium: 2.9 mmol/L — ABNORMAL LOW (ref 3.5–5.1)
Sodium: 137 mmol/L (ref 135–145)

## 2020-05-15 LAB — CBC
HCT: 38.4 % — ABNORMAL LOW (ref 39.0–52.0)
Hemoglobin: 12.6 g/dL — ABNORMAL LOW (ref 13.0–17.0)
MCH: 30.4 pg (ref 26.0–34.0)
MCHC: 32.8 g/dL (ref 30.0–36.0)
MCV: 92.5 fL (ref 80.0–100.0)
Platelets: 128 10*3/uL — ABNORMAL LOW (ref 150–400)
RBC: 4.15 MIL/uL — ABNORMAL LOW (ref 4.22–5.81)
RDW: 13.2 % (ref 11.5–15.5)
WBC: 5.9 10*3/uL (ref 4.0–10.5)
nRBC: 0 % (ref 0.0–0.2)

## 2020-05-15 LAB — HEPARIN LEVEL (UNFRACTIONATED): Heparin Unfractionated: <0.1 IU/mL — ABNORMAL LOW (ref 0.30–0.70)

## 2020-05-15 LAB — MAGNESIUM: Magnesium: 2 mg/dL (ref 1.7–2.4)

## 2020-05-15 MED ORDER — POTASSIUM CHLORIDE 10 MEQ/100ML IV SOLN
10.0000 meq | INTRAVENOUS | Status: AC
  Administered 2020-05-15 (×6): 10 meq via INTRAVENOUS
  Filled 2020-05-15 (×6): qty 100

## 2020-05-15 MED ORDER — HEPARIN (PORCINE) 25000 UT/250ML-% IV SOLN
1900.0000 [IU]/h | INTRAVENOUS | Status: AC
  Administered 2020-05-15 – 2020-05-18 (×4): 1900 [IU]/h via INTRAVENOUS
  Filled 2020-05-15 (×7): qty 250

## 2020-05-15 MED ORDER — KCL IN DEXTROSE-NACL 40-5-0.9 MEQ/L-%-% IV SOLN
INTRAVENOUS | Status: AC
  Filled 2020-05-15 (×3): qty 1000

## 2020-05-15 NOTE — Progress Notes (Signed)
[place] for IV heparin Indication: protein S deficiency, SMV thrombus (bridging while off Eliquis)  Allergies  Allergen Reactions  . Tylenol [Acetaminophen] Other (See Comments)    r/t elevated LFTs     Patient Measurements: Height: 6\' 3"  (190.5 cm) Weight: (!) 110.3 kg (243 lb 3.2 oz) IBW/kg (Calculated) : 84.5 Heparin Dosing Weight: 107 kg  Vital Signs:    Labs: Recent Labs    05/13/20 0422 05/13/20 1946 05/14/20 0359 05/14/20 1032 05/15/20 0532  HGB 13.5  --  12.4*  --  12.6*  HCT 40.4  --  36.9*  --  38.4*  PLT 110*  --  107*  --  128*  HEPARINUNFRC 0.27*   < > 0.45 0.46 <0.10*  CREATININE 0.94  --  0.88  --  0.86   < > = values in this interval not displayed.    Estimated Creatinine Clearance: 173 mL/min (by C-G formula based on SCr of 0.86 mg/dL).  Assessment: 56 yoM with PMH of SMV thrombus and protein S deficiency on Eliquis PTA, admitted 7/26 for abdominal pain. Found to have acute appendicitis and Eliquis held to allow for appendectomy. Pharmacy to bridge with heparin perioperatively.    Baseline heparin level slightly elevated at 0.46 as anticipated due to lab-drug interaction with apixaban, aPTT WNL at 23  Prior anticoagulation: Eliquis 5 mg PO bid, last dose 7/25 AM  Significant events:    7/27 s/pLaparoscopic debridement of abscess containing yellow pus and thick fibrinous exudatesby Dr. Hassell Done - heparin resumed 6 hrs after surgery w/ no bolus  7/29 AM: vomited medium amt of bright red blood in toilet. Heparin IV stopped at 0400; resumed at 1330 at lower rate  7/30: heparin held again for blood in NGT drainage - per [facility], defer resumption to Surgery  7/31: resume heparin at 1400 at previous rate per Dr. Rosendo Gros with Surgery  Today, 05/15/2020:  Surgery OK to resume heparin at previous rate; no bolus  CBC: Hgb stable WNL; Plt dropped with nadir 81 on 7/28, now back to near normal  SCr stable WNL  No  reports of any more bleeding this AM per RN; thinking it may have been d/t irritation from NGT  Goal of Therapy: Heparin level 0.3-0.7 units/ml Monitor platelets by anticoagulation protocol: Yes  Plan:  Resume IV heparin at 1900 units/hr  Daily CBC and heparin level  Monitor for signs of bleeding or thrombosis  F/u resolution of ileus and ability to resume PTA Eliquis  Reuel Boom, PharmD, BCPS [phone] 05/15/2020, 1:10 PM

## 2020-05-15 NOTE — Progress Notes (Signed)
4 Days Post-Op   Subjective/Chief Complaint: Pt with some nausea overnight NGT off sx for some time d/t bleeding but restarted Having BMs  Objective: Vital signs in last 24 hours: Temp:  [97.8 F (36.6 C)] 97.8 F (36.6 C) (07/30 2112) Pulse Rate:  [77-81] 77 (07/30 2112) Resp:  [16-17] 17 (07/30 2112) BP: (161-164)/(96-99) 164/96 (07/30 2112) SpO2:  [100 %] 100 % (07/30 2112) Last BM Date: 05/14/20  Intake/Output from previous day: 07/30 0701 - 07/31 0700 In: 2122.8 [P.O.:90; I.V.:1781.7; IV Piggyback:251.1] Out: 401 [Urine:301; Emesis/NG output:100] Intake/Output this shift: No intake/output data recorded.  General appearance: alert and cooperative GI: soft, nt, min dist, hypoactive BS  Lab Results:  Recent Labs    05/14/20 0359 05/15/20 0532  WBC 6.0 5.9  HGB 12.4* 12.6*  HCT 36.9* 38.4*  PLT 107* 128*   BMET Recent Labs    05/14/20 0359 05/15/20 0532  NA 134* 137  K 3.0* 2.9*  CL 100 102  CO2 26 24  GLUCOSE 114* 117*  BUN 8 6  CREATININE 0.88 0.86  CALCIUM 8.3* 9.1   PT/INR No results for input(s): LABPROT, INR in the last 72 hours. ABG No results for input(s): PHART, HCO3 in the last 72 hours.  Invalid input(s): PCO2, PO2  Studies/Results: DG Abd Portable 1V  Result Date: 05/14/2020 CLINICAL DATA:  Ileus EXAM: PORTABLE ABDOMEN - 1 VIEW COMPARISON:  05/13/2020 FINDINGS: Difficult on this single image centered low to determine exact position of the NG tube. Continued gaseous distention of bowel, with slight decreased small bowel distention since prior study. No organomegaly or suspicious calcification. IMPRESSION: Slight decreased gaseous distention of small bowel since prior study. Electronically Signed   By: Rolm Baptise M.D.   On: 05/14/2020 10:20   DG Abd Portable 1V  Result Date: 05/13/2020 CLINICAL DATA:  NG tube placement EXAM: PORTABLE ABDOMEN - 1 VIEW COMPARISON:  05/13/2020 FINDINGS: NG tube coils in the stomach. IMPRESSION: NG tube in  the stomach. Electronically Signed   By: Rolm Baptise M.D.   On: 05/13/2020 09:52    Anti-infectives: Anti-infectives (From admission, onward)   Start     Dose/Rate Route Frequency Ordered Stop   05/11/20 1800  piperacillin-tazobactam (ZOSYN) IVPB 3.375 g     Discontinue     3.375 g 12.5 mL/hr over 240 Minutes Intravenous Every 8 hours 05/11/20 1625     05/11/20 1000  cefTRIAXone (ROCEPHIN) 2 g in sodium chloride 0.9 % 100 mL IVPB  Status:  Discontinued       "And" Linked Group Details   2 g 200 mL/hr over 30 Minutes Intravenous Every 24 hours 05/10/20 1350 05/11/20 1540   05/10/20 1800  metroNIDAZOLE (FLAGYL) IVPB 500 mg  Status:  Discontinued       "And" Linked Group Details   500 mg 100 mL/hr over 60 Minutes Intravenous Every 8 hours 05/10/20 1350 05/11/20 1540   05/10/20 0915  cefTRIAXone (ROCEPHIN) 2 g in sodium chloride 0.9 % 100 mL IVPB       "And" Linked Group Details   2 g 200 mL/hr over 30 Minutes Intravenous  Once 05/10/20 0902 05/10/20 1223   05/10/20 0915  metroNIDAZOLE (FLAGYL) IVPB 500 mg       "And" Linked Group Details   500 mg 100 mL/hr over 60 Minutes Intravenous  Once 05/10/20 0902 05/10/20 1115      Assessment/Plan: HTN HLD Grave's disease Primary sclerosing cholangitiss/plap chole (in CTL) - followed byGI,Dr.Armbrusterper PCP's note  Protein  S deficiency HxSMV thrombosis on Eliquis ABL anemia - Hgb 12.6, monitor - Per [facility], appreciate their assistance with this patient - Moderate malnutrition - prealbumin 9.9    Chronic ruptured appendicitis with abscess and unrecognizable structures involving the urachus and pelvis. -s/pLaparoscopic debridement of abscess containing yellow pus and thick fibrinous exudatesby Dr. Hassell Done on 05/11/2020 - POD #4 - persistent ileus - Continue abx - Mobilize(in halls) - IS - Keep K >4 and Mg > 2 for bowel function -Pathw disrupted fibromuscular tissue with marked acute inflammation, consistent with  clinically stated appendix with appendicitis   FEN -NPO,NGT,IVF, replace K VTE -SCDs, IV Heparin, continue to hold eliquis ID -Rocephin/Flagyl7/26 - 7/27. Zosyn 7/27 >>day#4 Foley - None Follow-Up- Dr. Hassell Done   Plan: Continue NPO/NGT and await return in bowel function. Continue mobilizing, minimize narcotics. Monitor electrolytes, K to be replaced. Continue IV antibiotics. If no improvement of ileus in the next few days will likely obtain CT scan Sunday/Monday, or sooner if any issues. Check prealbumin.      LOS: 5 days    Ralene Ok 05/15/2020

## 2020-05-15 NOTE — Progress Notes (Signed)
[facility]                                                                              Patient Demographics  Kyle Gonzales, is a 29 y.o. male, DOB - August 23, 1991, DTO:[id number]  Admit date - 05/10/2020   Admitting Physician Harold Hedge, MD  Outpatient Primary MD for the patient is [name], MD  Outpatient specialists:   LOS - 5  days   Medical records reviewed and are as summarized below:    Chief Complaint  Patient presents with  . Abdominal Pain       Brief summary   Patient is a 29 year old male with past medical history of primary sclerosing cholangitis follows with Dr. 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 CT abdomen pelvis at [facility] acute nonperforated appendicitis. He was given ceftriaxone and Flagyl transferred to [facility] after ED physician had discussion with Dr. Hassell Done, general surgery on-call.    Eliquis was held and patient was admitted for further management.  Assessment & Plan    Principal Problem: Acute appendicitis without perforation with postop ileus -With underlying chronic ruptured appendicitis with abscess  -Patient was placed on n.p.o. status with IV fluids, IV antibiotics -General surgery was consulted, patient underwent laparoscopic debridement of the abscess containing yellow pus and thick fibrinous exudates, Dr. Hassell Done.  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  However during suctioning, started bleeding, currently on hold  Essential hypertension -BP readings elevated, likely due to pain and ileus -Continue IV Lopressor  Mild acute kidney injury, hypokalemia -Continue IV fluid hydration, n.p.o. -Creatinine 1.29 on admission -Currently stable, 0.8  Hypokalemia -Placed on IV replacement with IV fluids  History of Graves' disease status post Kaiah Hosea ablation now with acquired hypothyroidism -TSH 9.45, on 7/21.  Continue Synthroid 125 MCG, appears to have been increased at admission -Continue IV Synthroid while n.p.o.   Hyperlipidemia Holding home medications.  Thrombocytopenia Platelets improving, follow closely  History of primary sclerosing cholangitis Chronic changes noted on CT scan.  Patient is followed by [facility] gastroenterology.   Obesity Estimated body mass index is 30.4 kg/m as calculated from the following:   Height as of this encounter: 6\' 3"  (1.905 m).   Weight as of this encounter: 110.3 kg.  Code Status: Full CODE STATUS DVT Prophylaxis: Heparin drip Family Communication: Discussed all imaging results, lab results, explained to the patient and mother at the bedside   Disposition Plan:     Status is: Inpatient  Remains inpatient appropriate because:IV treatments appropriate due to intensity of illness or inability to take PO   Dispo:  Patient From: Home  Planned Disposition: Home  Expected discharge  date: 05/17/20  Medically stable for discharge: No, still with ileus       Time Spent in minutes   25 minutes  Procedures:   laparoscopic debridement of the abscess*7/27  Consultants:   General surgery  Antimicrobials:   Anti-infectives (From admission, onward)   Start     Dose/Rate Route Frequency Ordered Stop   05/11/20 1800  piperacillin-tazobactam (ZOSYN) IVPB 3.375 g     Discontinue     3.375 g 12.5 mL/hr over 240 Minutes Intravenous Every 8 hours 05/11/20 1625      05/11/20 1000  cefTRIAXone (ROCEPHIN) 2 g in sodium chloride 0.9 % 100 mL IVPB  Status:  Discontinued       "And" Linked Group Details   2 g 200 mL/hr over 30 Minutes Intravenous Every 24 hours 05/10/20 1350 05/11/20 1540   05/10/20 1800  metroNIDAZOLE (FLAGYL) IVPB 500 mg  Status:  Discontinued       "And" Linked Group Details   500 mg 100 mL/hr over 60 Minutes Intravenous Every 8 hours 05/10/20 1350 05/11/20 1540   05/10/20 0915  cefTRIAXone (ROCEPHIN) 2 g in sodium chloride 0.9 % 100 mL IVPB       "And" Linked Group Details   2 g 200 mL/hr over 30 Minutes Intravenous  Once 05/10/20 0902 05/10/20 1223   05/10/20 0915  metroNIDAZOLE (FLAGYL) IVPB 500 mg       "And" Linked Group Details   500 mg 100 mL/hr over 60 Minutes Intravenous  Once 05/10/20 0902 05/10/20 1115         Medications  Scheduled Meds: . busPIRone  10 mg Oral TID  . levothyroxine  50 mcg Intravenous Q0600  . metoprolol tartrate  5 mg Intravenous Q6H  . pantoprazole (PROTONIX) IV  40 mg Intravenous BID   Continuous Infusions: . dextrose 5 % and 0.9 % NaCl with KCl 40 mEq/L 100 mL/hr at 05/15/20 1105  . heparin    . methocarbamol (ROBAXIN) IV    . piperacillin-tazobactam (ZOSYN)  IV 3.375 g (05/15/20 0920)  . potassium chloride 10 mEq (05/15/20 1426)   PRN Meds:.hydrALAZINE, menthol-cetylpyridinium, methocarbamol (ROBAXIN) IV, morphine injection, ondansetron **OR** ondansetron (ZOFRAN) IV, phenol, prochlorperazine      Subjective:   Kyle Gonzales was seen and examined today.  States had 2 BMs today, passing flatus.  Ambulating in the room, not in any obvious pain.  No active nausea vomiting or abdominal pain.  No fevers..    Objective:   Vitals:   05/14/20 0626 05/14/20 1343 05/14/20 2112 05/15/20 1347  BP: (!) 155/83 (!) 161/99 (!) 164/96 (!) 162/96  Pulse: 55 81 77 96  Resp: 19 16 17 18   Temp: (!) 97.4 F (36.3 C) 97.8 F (36.6 C) 97.8 F (36.6 C) 98.3 F (36.8 C)  TempSrc: Oral  Oral Oral    SpO2: 99% 100% 100% 100%  Weight:      Height:        Intake/Output Summary (Last 24 hours) at 05/15/2020 1440 Last data filed at 05/15/2020 0730 Gross per 24 hour  Intake 1609.14 ml  Output 650 ml  Net 959.14 ml     Wt Readings from Last 3 Encounters:  05/10/20 (!) 110.3 kg  05/05/20 110.8 kg  11/18/19 104.6 kg   Physical Exam  General: Alert and oriented x 3, NAD  Cardiovascular: S1 S2 clear, RRR. No pedal edema b/l  Respiratory: CTAB, no wheezing, rales or rhonchi  Gastrointestinal: Soft, nontender, nondistended, +BS  Ext: no  pedal edema bilaterally  Neuro: no new deficits  Musculoskeletal: No cyanosis, clubbing  Skin: No rashes  Psych: Normal affect and demeanor, alert and oriented x3     Data Reviewed:  I have personally reviewed following labs and imaging studies  Micro Results Recent Results (from the past 240 hour(s))  SARS Coronavirus 2 by RT PCR (hospital order, performed in [facility] hospital lab) Nasopharyngeal Nasopharyngeal Swab     Status: None   Collection Time: 05/10/20  9:31 AM   Specimen: Nasopharyngeal Swab  Result Value Ref Range Status   SARS Coronavirus 2 NEGATIVE NEGATIVE Final    Comment: (NOTE) SARS-CoV-2 target nucleic acids are NOT DETECTED.  The SARS-CoV-2 RNA is generally detectable in upper and lower respiratory specimens during the acute phase of infection. The lowest concentration of SARS-CoV-2 viral copies this assay can detect is 250 copies / mL. A negative result does not preclude SARS-CoV-2 infection and should not be used as the sole basis for treatment or other patient management decisions.  A negative result may occur with improper specimen collection / handling, submission of specimen other than nasopharyngeal swab, presence of viral mutation(s) within the areas targeted by this assay, and inadequate number of viral copies (<250 copies / mL). A negative result must be combined with clinical observations, patient  history, and epidemiological information.  Fact Sheet for Patients:   StrictlyIdeas.no  Fact Sheet for Healthcare Providers: BankingDealers.co.za  This test is not yet approved or  cleared by the [place] FDA and has been authorized for detection and/or diagnosis of SARS-CoV-2 by FDA under an Emergency Use Authorization (EUA).  This EUA will remain in effect (meaning this test can be used) for the duration of the COVID-19 declaration under Section 564(b)(1) of the Act, 21 U.S.C. section 360bbb-3(b)(1), unless the authorization is terminated or revoked sooner.  Performed at [facility], [address], [place], [place] [address]   Surgical pcr screen     Status: None   Collection Time: 05/11/20  1:22 AM   Specimen: Nasal Mucosa; Nasal Swab  Result Value Ref Range Status   MRSA, PCR NEGATIVE NEGATIVE Final   Staphylococcus aureus NEGATIVE NEGATIVE Final    Comment: (NOTE) The Xpert SA Assay (FDA approved for NASAL specimens in patients 35 years of age and older), is one component of a comprehensive surveillance program. It is not intended to diagnose infection nor to guide or monitor treatment. Performed at [facility], [place] [address] [place], [place] [address]     Radiology Reports CT Abdomen Pelvis W Contrast  Result Date: 05/10/2020 CLINICAL DATA:  29 year old male with history of acute onset of left lower quadrant tenderness and left-sided flank pain. Leukocytosis. History of primary sclerosing cholangitis. EXAM: CT ABDOMEN AND PELVIS WITH CONTRAST TECHNIQUE: Multidetector CT imaging of the abdomen and pelvis was performed using the standard protocol following bolus administration of intravenous contrast. CONTRAST:  121mL OMNIPAQUE IOHEXOL 300 MG/ML  SOLN COMPARISON:  CT the abdomen and pelvis 07/05/2018. FINDINGS: Lower chest: Unremarkable. Hepatobiliary: Liver has a irregular contour, and  there is moderate intrahepatic biliary ductal dilatation, increased slightly compared to prior studies, compatible with reported clinical history of primary sclerosing cholangitis. In segment 7 of the liver there is a 2.6 x 2.0 cm intermediate attenuation lesion (axial image 22 of series 2). In segment 5 of the liver there is a 1.5 x 1.4 cm intermediate attenuation lesion (axial image 30 of series 2). Both of these lesions  were previously characterized as cavernous hemangiomas on prior abdominal MRI 08/16/2018. No other suspicious hepatic lesions. Status post cholecystectomy. Cavernous transformation in the porta hepatis. Common bile duct is normal in caliber. Pancreas: No pancreatic mass. No pancreatic ductal dilatation. No pancreatic or peripancreatic fluid collections or inflammatory changes. Spleen: Unremarkable. Adrenals/Urinary Tract: Bilateral kidneys and adrenal glands are normal in appearance. No hydroureteronephrosis. Urinary bladder is normal in appearance. Stomach/Bowel: Normal appearance of the stomach. No pathologic dilatation of small bowel or colon. The appendix is dilated and inflamed, concerning for an acute appendicitis. Appendix: Location: Low anatomic pelvis Diameter: 2.0 cm Appendicolith: None Mucosal hyper-enhancement: Present Extraluminal gas: None Periappendiceal collection: None Vascular/Lymphatic: No significant atherosclerotic disease, aneurysm or dissection noted in the abdominal or pelvic vasculature. 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 No lymphadenopathy noted in the abdomen or pelvis. Reproductive: Prostate gland and seminal vesicles are unremarkable in appearance. Other: No significant volume of ascites.  No pneumoperitoneum. Musculoskeletal: There are no aggressive appearing lytic or blastic lesions noted in the visualized portions of the skeleton. IMPRESSION: 1. Findings are compatible with acute appendicitis. No  periappendiceal abscess or signs of frank perforation are noted at this time. Surgical consultation is recommended. 2. Chronic changes related to primary sclerosing cholangitis redemonstrated with increased intrahepatic biliary ductal dilatation, as above. 3. 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 4. 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 Electronically Signed   By: Vinnie Langton M.D.   On: 05/10/2020 08:33   DG Abd Portable 1V  Result Date: 05/15/2020 CLINICAL DATA:  Follow-up likely postoperative ileus after laparoscopic appendectomy (postop day 4). EXAM: PORTABLE ABDOMEN - 1 VIEW COMPARISON:  05/14/2020 and earlier. FINDINGS: Nasogastric tube tip projects over the gastric fundus. Numerous dilated loops of small bowel in the upper and mid abdomen have increased in caliber since the examinations yesterday and 05/13/2020. Gas is present throughout the decompressed colon. No suggestion of free air on the supine image. IMPRESSION: 1. Worsening postoperative ileus versus small-bowel obstruction with increase in caliber of the dilated small bowel loops in the upper abdomen. 2. Nasogastric tube tip projects over the gastric fundus. 3. No suggestion of free air on the supine image. Electronically Signed   By: Evangeline Dakin M.D.   On: 05/15/2020 10:02   DG Abd Portable 1V  Result Date: 05/14/2020 CLINICAL DATA:  Ileus EXAM: PORTABLE ABDOMEN - 1 VIEW COMPARISON:  05/13/2020 FINDINGS: Difficult on this single image centered low to determine exact position of the NG tube. Continued gaseous distention of bowel, with slight decreased small bowel distention since prior study. No organomegaly or suspicious calcification. IMPRESSION: Slight decreased gaseous distention of small bowel since prior study. Electronically Signed   By: Rolm Baptise M.D.   On: 05/14/2020 10:20   DG Abd Portable 1V  Result Date:  05/13/2020 CLINICAL DATA:  NG tube placement EXAM: PORTABLE ABDOMEN - 1 VIEW COMPARISON:  05/13/2020 FINDINGS: NG tube coils in the stomach. IMPRESSION: NG tube in the stomach. Electronically Signed   By: Rolm Baptise M.D.   On: 05/13/2020 09:52   DG Abd Portable 1V  Result Date: 05/13/2020 CLINICAL DATA:  Ileus EXAM: PORTABLE ABDOMEN - 1 VIEW COMPARISON:  May 12, 2020 FINDINGS: Nasogastric tube no longer appreciable. There remain multiple loops of dilated bowel without appreciable air-fluid level. No free air. Lung bases clear. IMPRESSION: Persistent dilatation of multiple loops of  small bowel. Nasogastric tube no longer evident. Suspect ileus, although a degree of bowel obstruction cannot be excluded on this study. No free air evident. Lung bases clear. Electronically Signed   By: Lowella Grip III M.D.   On: 05/13/2020 07:11   DG Abd Portable 1V  Result Date: 05/12/2020 CLINICAL DATA:  Encounter for imaging study to confirm nasogastric (NG) tube placement EXAM: PORTABLE ABDOMEN - 1 VIEW COMPARISON:  04/28/2018 FINDINGS: Nasogastric tube passes well below the diaphragm, curling within the proximal to mid stomach. There is increased bowel gas with mild small bowel dilation. Small-bowel dilation is similar to the prior study. IMPRESSION: Well-positioned nasogastric tube. Electronically Signed   By: Lajean Manes M.D.   On: 05/12/2020 11:37    Lab Data:  CBC: Recent Labs  Lab 05/11/20 0041 05/12/20 0458 05/13/20 0422 05/14/20 0359 05/15/20 0532  WBC 8.4 6.7 8.2 6.0 5.9  HGB 14.3 13.1 13.5 12.4* 12.6*  HCT 43.0 39.6 40.4 36.9* 38.4*  MCV 92.5 92.3 92.0 90.9 92.5  PLT 103* 81* 110* 107* 798*   Basic Metabolic Panel: Recent Labs  Lab 05/11/20 0041 05/12/20 0458 05/13/20 0422 05/14/20 0359 05/15/20 0532  NA 139 137 138 134* 137  K 3.1* 3.1* 3.0* 3.0* 2.9*  CL 103 100 100 100 102  CO2 24 25 23 26 24   GLUCOSE 122* 147* 124* 114* 117*  BUN 9 7 10 8 6   CREATININE 1.01 0.86 0.94  0.88 0.86  CALCIUM 9.0 8.9 9.1 8.3* 9.1  MG 1.7 1.9 2.1  --  2.0   GFR: Estimated Creatinine Clearance: 173 mL/min (by C-G formula based on SCr of 0.86 mg/dL). Liver Function Tests: Recent Labs  Lab 05/10/20 0149  AST 27  ALT 22  ALKPHOS 46  BILITOT 1.0  PROT 8.7*  ALBUMIN 5.4*   Recent Labs  Lab 05/10/20 0149  LIPASE 26   No results for input(s): AMMONIA in the last 168 hours. Coagulation Profile: No results for input(s): INR, PROTIME in the last 168 hours. Cardiac Enzymes: No results for input(s): CKTOTAL, CKMB, CKMBINDEX, TROPONINI in the last 168 hours. BNP (last 3 results) No results for input(s): PROBNP in the last 8760 hours. HbA1C: No results for input(s): HGBA1C in the last 72 hours. CBG: No results for input(s): GLUCAP in the last 168 hours. Lipid Profile: No results for input(s): CHOL, HDL, LDLCALC, TRIG, CHOLHDL, LDLDIRECT in the last 72 hours. Thyroid Function Tests: No results for input(s): TSH, T4TOTAL, FREET4, T3FREE, THYROIDAB in the last 72 hours. Anemia Panel: No results for input(s): VITAMINB12, FOLATE, FERRITIN, TIBC, IRON, RETICCTPCT in the last 72 hours. Urine analysis:    Component Value Date/Time   COLORURINE YELLOW 05/10/2020 0901   APPEARANCEUR CLEAR 05/10/2020 0901   LABSPEC 1.010 05/10/2020 0901   PHURINE 8.0 05/10/2020 0901   GLUCOSEU NEGATIVE 05/10/2020 0901   HGBUR MODERATE (A) 05/10/2020 0901   BILIRUBINUR NEGATIVE 05/10/2020 0901   KETONESUR 40 (A) 05/10/2020 0901   PROTEINUR NEGATIVE 05/10/2020 0901   NITRITE NEGATIVE 05/10/2020 0901   LEUKOCYTESUR NEGATIVE 05/10/2020 0901     Cordie Beazley M.D. [facility] 05/15/2020, 2:40 PM   Call night coverage person covering after 7pm

## 2020-05-16 ENCOUNTER — Inpatient Hospital Stay: Payer: Self-pay

## 2020-05-16 ENCOUNTER — Other Ambulatory Visit: Payer: Self-pay | Admitting: Internal Medicine

## 2020-05-16 DIAGNOSIS — E039 Hypothyroidism, unspecified: Secondary | ICD-10-CM

## 2020-05-16 LAB — BASIC METABOLIC PANEL
Anion gap: 8 (ref 5–15)
BUN: 7 mg/dL (ref 6–20)
CO2: 22 mmol/L (ref 22–32)
Calcium: 8.6 mg/dL — ABNORMAL LOW (ref 8.9–10.3)
Chloride: 106 mmol/L (ref 98–111)
Creatinine, Ser: 0.8 mg/dL (ref 0.61–1.24)
GFR calc Af Amer: >60 mL/min (ref >60)
GFR calc non Af Amer: >60 mL/min (ref >60)
Glucose, Bld: 111 mg/dL — ABNORMAL HIGH (ref 70–99)
Potassium: 3.2 mmol/L — ABNORMAL LOW (ref 3.5–5.1)
Sodium: 136 mmol/L (ref 135–145)

## 2020-05-16 LAB — CBC
HCT: 37.8 % — ABNORMAL LOW (ref 39.0–52.0)
Hemoglobin: 12.4 g/dL — ABNORMAL LOW (ref 13.0–17.0)
MCH: 30.2 pg (ref 26.0–34.0)
MCHC: 32.8 g/dL (ref 30.0–36.0)
MCV: 92.2 fL (ref 80.0–100.0)
Platelets: 137 10*3/uL — ABNORMAL LOW (ref 150–400)
RBC: 4.1 MIL/uL — ABNORMAL LOW (ref 4.22–5.81)
RDW: 13.4 % (ref 11.5–15.5)
WBC: 7.3 10*3/uL (ref 4.0–10.5)
nRBC: 0 % (ref 0.0–0.2)

## 2020-05-16 LAB — HEPARIN LEVEL (UNFRACTIONATED): Heparin Unfractionated: 0.59 IU/mL (ref 0.30–0.70)

## 2020-05-16 LAB — MAGNESIUM: Magnesium: 2 mg/dL (ref 1.7–2.4)

## 2020-05-16 LAB — PHOSPHORUS: Phosphorus: 3 mg/dL (ref 2.5–4.6)

## 2020-05-16 MED ORDER — CHLORHEXIDINE GLUCONATE CLOTH 2 % EX PADS
6.0000 | MEDICATED_PAD | Freq: Every day | CUTANEOUS | Status: DC
  Administered 2020-05-16 – 2020-05-17 (×2): 6 via TOPICAL

## 2020-05-16 MED ORDER — LORAZEPAM 2 MG/ML IJ SOLN
0.5000 mg | Freq: Once | INTRAMUSCULAR | Status: AC
  Administered 2020-05-16: 0.5 mg via INTRAVENOUS
  Filled 2020-05-16: qty 1

## 2020-05-16 MED ORDER — POTASSIUM CHLORIDE 10 MEQ/100ML IV SOLN
INTRAVENOUS | Status: AC
  Filled 2020-05-16: qty 100

## 2020-05-16 MED ORDER — SODIUM CHLORIDE 0.9 % IV SOLN: INTRAVENOUS | Status: DC

## 2020-05-16 MED ORDER — POTASSIUM CHLORIDE 10 MEQ/100ML IV SOLN
10.0000 meq | INTRAVENOUS | Status: AC
  Administered 2020-05-16 (×5): 10 meq via INTRAVENOUS
  Filled 2020-05-16 (×4): qty 100

## 2020-05-16 MED ORDER — POTASSIUM CHLORIDE 10 MEQ/100ML IV SOLN
INTRAVENOUS | Status: AC
  Administered 2020-05-16: 10 meq
  Filled 2020-05-16: qty 100

## 2020-05-16 MED ORDER — TRAVASOL 10 % IV SOLN
INTRAVENOUS | Status: DC
  Filled 2020-05-16: qty 480

## 2020-05-16 MED ORDER — INSULIN ASPART 100 UNIT/ML ~~LOC~~ SOLN: 0.0000 [IU] | Freq: Four times a day (QID) | SUBCUTANEOUS | Status: DC

## 2020-05-16 MED ORDER — LEVOTHYROXINE SODIUM 100 MCG PO TABS: ORAL_TABLET | ORAL | 0 refills | Status: DC

## 2020-05-16 MED ORDER — SODIUM CHLORIDE 0.9% FLUSH: 10.0000 mL | INTRAVENOUS | Status: DC | PRN

## 2020-05-16 NOTE — Progress Notes (Signed)
MD made aware that TPN cant be started tonight.

## 2020-05-16 NOTE — Progress Notes (Signed)
Patient is still refusing to hook NGT to suction d/t frequent urination.

## 2020-05-16 NOTE — Progress Notes (Signed)
Patient has refused to be clamped to suction due to frequent urination.

## 2020-05-16 NOTE — Progress Notes (Addendum)
PHARMACY - TOTAL PARENTERAL NUTRITION CONSULT NOTE   Indication: prolonged ileus  Patient Measurements: Height: 6\' 3"  (190.5 cm) Weight: (!) 110.3 kg (243 lb 3.2 oz) IBW/kg (Calculated) : 84.5 TPN AdjBW (KG): 91 Body mass index is 30.4 kg/m. Usual Weight: 110 kg on admission (no prior nutrition issues)  Assessment:  44 yoM with PMH primary sclerosing cholangitis, protein S deficiency and SMV thrombus on Eliquis, HTN, HLD, depression, Graves' disease/acquired hypothyroid, admitted for abdominal pain. Taken to OR for acute appendicitis and found to have chronic ruptured appendicitis with abscess and significant distortions of RLQ anatomy including adhesions with lower abdominal wall. Some flatus but no BM on POD5; Dx with persistent ileus and pharmacy consulted to dose TPN  CBGs (goal 100-150): no CBGs ordered; SBGs stable WNL prior to starting TPN - no Hx DM Electrolytes: K low; Mg, Phos pending, others stable WNL Renal: SCr, BUN, bicarb stable WNL; UOP low but incompletely charted Hepatic: pending (all WNL prior to surgery) Prealbumin / TG: Prealbumin low; TG pending I/O: D5-NS w/ 40K at 100 ml/hr; 800 ml emesis/NG output overnight GI Imaging: - 7/29: persistent dilation of SB loops - 7/31: worsening ileus Surgeries / Procedures:  - 7/27: laparoscopic debridement of abscess and appendectomy  Central access: PICC placed 8/1 TPN start date: 8/1  Nutritional Goals RD recs pending  Current Nutrition:  NPO  Plan: MD replacing K already, no additional needed    Start TPN at 40 mL/hr at 1800  Electrolytes in TPN: standard  Na - 55mEq/L  K - 92mEq/L  Ca - 79mEq/L  Mg - 61mEq/L  Phos - 20mmol/L  Cl:Ac ratio 1:1  Add standard MVI and trace elements to TPN  Initiate Sensitive q6h SSI and adjust as needed   Reduce MIVF to 60 mL/hr at 1800; remove D5 and KCl  Monitor TPN labs on Mon/Thurs  Reuel Boom, PharmD, BCPS [phone] 05/16/2020, 9:33 AM

## 2020-05-16 NOTE — Progress Notes (Signed)
[facility] and [facility] Pharmacists notified re initial TNA infusion.  Ashleigh RN to notify Dr Tana Coast.

## 2020-05-16 NOTE — Progress Notes (Addendum)
[facility]                                                                              Patient Demographics  Kyle Gonzales, is a 29 y.o. male, DOB - January 30, 1991, HLK:[id number]  Admit date - 05/10/2020   Admitting Physician Kyle Hedge, MD  Outpatient Primary MD for the patient is Kyle Pinto, MD  Outpatient specialists:   LOS - 6  days   Medical records reviewed and are as summarized below:    Chief Complaint  Patient presents with  . Abdominal Pain       Brief summary   Patient is a 29 year old male with past medical history of primary sclerosing cholangitis follows with Dr. 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 CT abdomen pelvis at [facility] acute nonperforated appendicitis. He was given ceftriaxone and Flagyl transferred to [facility] after ED physician had discussion with Kyle Gonzales, general surgery on-call.    Eliquis was held and patient was admitted for further management.  Assessment & Plan    Principal Problem: Acute appendicitis without perforation with postop ileus -With underlying chronic ruptured appendicitis with abscess  -Placed on n.p.o. status with IV fluids, IV antibiotics -Underwent laparoscopic debridement of the abscess containing yellow pus and thick fibrinous exudates, Kyle Gonzales.  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 -Creatinine 1.29 on admission -Cr stable, 0.8   Hypokalemia -replaced   History of Graves' disease status post Kyle Gonzales ablation now with acquired hypothyroidism -TSH 9.45, on 7/21.  Continue Synthroid 125 MCG, appears to have been increased at admission -Continue IV Synthroid while n.p.o.  Hyperlipidemia Holding home medications.  Thrombocytopenia Platelets improving  History of primary sclerosing cholangitis Chronic changes noted on CT scan.  Patient is followed by [facility] gastroenterology.  Obesity Estimated body mass index is 30.4 kg/m as calculated from the following:   Height as of this encounter: 6\' 3"  (1.905 m).   Weight as of this encounter: 110.3 kg.  Code Status: Full CODE STATUS DVT Prophylaxis: Heparin drip Family Communication: Discussed all imaging results, lab results, explained to the patient and spoke with mother at the bedside on 7/31   Disposition Plan:     Status is: Inpatient  Remains inpatient appropriate because:IV treatments appropriate due to intensity of illness or inability to take PO   Dispo:  Patient From: Home  Planned Disposition: Home  Expected discharge date: 05/17/20  Medically stable for discharge: No, still with ileus, starting TPN  Time Spent in minutes   25 minutes  Procedures:   laparoscopic debridement of the abscess*7/27  Consultants:   General surgery  Antimicrobials:   Anti-infectives (From admission, onward)   Start     Dose/Rate Route Frequency Ordered Stop   05/11/20 1800  piperacillin-tazobactam (ZOSYN) IVPB 3.375 g     Discontinue     3.375 g 12.5 mL/hr over 240 Minutes Intravenous Every 8 hours 05/11/20 1625     05/11/20 1000  cefTRIAXone (ROCEPHIN) 2 g in sodium chloride 0.9 % 100 mL IVPB  Status:  Discontinued       "And" Linked Group Details     2 g 200 mL/hr over 30 Minutes Intravenous Every 24 hours 05/10/20 1350 05/11/20 1540   05/10/20 1800  metroNIDAZOLE (FLAGYL) IVPB 500 mg  Status:  Discontinued       "And" Linked Group Details   500 mg 100 mL/hr over 60 Minutes Intravenous Every 8 hours 05/10/20 1350 05/11/20 1540   05/10/20 0915  cefTRIAXone (ROCEPHIN) 2 g in sodium chloride 0.9 % 100 mL IVPB       "And" Linked Group Details   2 g 200 mL/hr over 30 Minutes Intravenous  Once 05/10/20 0902 05/10/20 1223   05/10/20 0915  metroNIDAZOLE (FLAGYL) IVPB 500 mg       "And" Linked Group Details   500 mg 100 mL/hr over 60 Minutes Intravenous  Once 05/10/20 0902 05/10/20 1115         Medications  Scheduled Meds: . busPIRone  10 mg Oral TID  . Chlorhexidine Gluconate Cloth  6 each Topical Daily  . [START ON 05/17/2020] insulin aspart  0-9 Units Subcutaneous Q6H  . levothyroxine  50 mcg Intravenous Q0600  . metoprolol tartrate  5 mg Intravenous Q6H  . pantoprazole (PROTONIX) IV  40 mg Intravenous BID   Continuous Infusions: . sodium chloride    . dextrose 5 % and 0.9 % NaCl with KCl 40 mEq/L 100 mL/hr at 05/16/20 0309  . heparin 1,900 Units/hr (05/16/20 0516)  . methocarbamol (ROBAXIN) IV    . piperacillin-tazobactam (ZOSYN)  IV 3.375 g (05/16/20 0250)  . potassium chloride 10 mEq (05/16/20 1037)  . TPN ADULT (ION)     PRN Meds:.hydrALAZINE, menthol-cetylpyridinium, methocarbamol (ROBAXIN) IV, morphine injection, ondansetron **OR** ondansetron (ZOFRAN) IV, phenol, prochlorperazine, sodium chloride flush      Subjective:   Kyle Gonzales was seen and examined today. Still no BM, NGT to intermittent wall suction. No fevers or chills. +flatus       Objective:   Vitals:   05/14/20 2112 05/15/20 1347 05/15/20 2206 05/16/20 0612  BP: (!) 164/96 (!) 162/96 (!) 157/92 (!) 149/97  Pulse: 77 96 74 93  Resp: 17 18 18 20   Temp: 97.8 F (36.6 C) 98.3 F (36.8 C) 98.3 F (36.8 C) 97.9 F (36.6 C)  TempSrc: Oral Oral  Oral Oral  SpO2: 100% 100% 100% 100%  Weight:      Height:        Intake/Output Summary (Last 24 hours) at 05/16/2020 1214 Last data filed at 05/16/2020 0553 Gross per 24 hour  Intake 2199.59 ml  Output 1450 ml  Net 749.59 ml     Wt Readings from Last 3 Encounters:  05/10/20 (!) 110.3 kg  05/05/20 110.8 kg  11/18/19 104.6 kg    Physical Exam  General: Alert and oriented x 3, NAD  Cardiovascular: S1 S2 clear, RRR. No pedal edema b/l  Respiratory: CTAB, no wheezing, rales or  rhonchi  Gastrointestinal: Soft, minimal TTP, +distended, hypoactive BS  Ext: no pedal edema bilaterally  Neuro: no new deficits  Musculoskeletal: No cyanosis, clubbing  Skin: No rashes  Psych: Normal affect and demeanor, alert and oriented x3     Data Reviewed:  I have personally reviewed following labs and imaging studies  Micro Results Recent Results (from the past 240 hour(s))  SARS Coronavirus 2 by RT PCR (hospital order, performed in [facility] hospital lab) Nasopharyngeal Nasopharyngeal Swab     Status: None   Collection Time: 05/10/20  9:31 AM   Specimen: Nasopharyngeal Swab  Result Value Ref Range Status   SARS Coronavirus 2 NEGATIVE NEGATIVE Final    Comment: (NOTE) SARS-CoV-2 target nucleic acids are NOT DETECTED.  The SARS-CoV-2 RNA is generally detectable in upper and lower respiratory specimens during the acute phase of infection. The lowest concentration of SARS-CoV-2 viral copies this assay can detect is 250 copies / mL. A negative result does not preclude SARS-CoV-2 infection and should not be used as the sole basis for treatment or other patient management decisions.  A negative result may occur with improper specimen collection / handling, submission of specimen other than nasopharyngeal swab, presence of viral mutation(s) within the areas targeted by this assay, and inadequate number of viral copies (<250 copies / mL). A negative result must be combined with  clinical observations, patient history, and epidemiological information.  Fact Sheet for Patients:   StrictlyIdeas.no  Fact Sheet for Healthcare Providers: BankingDealers.co.za  This test is not yet approved or  cleared by the [place] FDA and has been authorized for detection and/or diagnosis of SARS-CoV-2 by FDA under an Emergency Use Authorization (EUA).  This EUA will remain in effect (meaning this test can be used) for the duration of the COVID-19 declaration under Section 564(b)(1) of the Act, 21 U.S.C. section 360bbb-3(b)(1), unless the authorization is terminated or revoked sooner.  Performed at [facility], [address] [place], [place] [address]   Surgical pcr screen     Status: None   Collection Time: 05/11/20  1:22 AM   Specimen: Nasal Mucosa; Nasal Swab  Result Value Ref Range Status   MRSA, PCR NEGATIVE NEGATIVE Final   Staphylococcus aureus NEGATIVE NEGATIVE Final    Comment: (NOTE) The Xpert SA Assay (FDA approved for NASAL specimens in patients 71 years of age and older), is one component of a comprehensive surveillance program. It is not intended to diagnose infection nor to guide or monitor treatment. Performed at [facility], [place] [address], [place], [place] [address]     Radiology Reports CT Abdomen Pelvis W Contrast  Result Date: 05/10/2020 CLINICAL DATA:  29 year old male with history of acute onset of left lower quadrant tenderness and left-sided flank pain. Leukocytosis. History of primary sclerosing cholangitis. EXAM: CT ABDOMEN AND PELVIS WITH CONTRAST TECHNIQUE: Multidetector CT imaging of the abdomen and pelvis was performed using the standard protocol following bolus administration of intravenous contrast. CONTRAST:  1106mL OMNIPAQUE IOHEXOL 300 MG/ML  SOLN COMPARISON:  CT the abdomen and pelvis 07/05/2018. FINDINGS: Lower chest: Unremarkable. Hepatobiliary: Liver  has a irregular contour, and there is moderate intrahepatic biliary ductal dilatation, increased slightly compared to prior studies, compatible with reported clinical history of primary sclerosing cholangitis. In segment 7 of the liver there is a 2.6 x 2.0 cm intermediate attenuation lesion (axial image 22 of series 2). In segment 5 of the liver there is a 1.5 x 1.4 cm intermediate  attenuation lesion (axial image 30 of series 2). Both of these lesions were previously characterized as cavernous hemangiomas on prior abdominal MRI 08/16/2018. No other suspicious hepatic lesions. Status post cholecystectomy. Cavernous transformation in the porta hepatis. Common bile duct is normal in caliber. Pancreas: No pancreatic mass. No pancreatic ductal dilatation. No pancreatic or peripancreatic fluid collections or inflammatory changes. Spleen: Unremarkable. Adrenals/Urinary Tract: Bilateral kidneys and adrenal glands are normal in appearance. No hydroureteronephrosis. Urinary bladder is normal in appearance. Stomach/Bowel: Normal appearance of the stomach. No pathologic dilatation of small bowel or colon. The appendix is dilated and inflamed, concerning for an acute appendicitis. Appendix: Location: Low anatomic pelvis Diameter: 2.0 cm Appendicolith: None Mucosal hyper-enhancement: Present Extraluminal gas: None Periappendiceal collection: None Vascular/Lymphatic: No significant atherosclerotic disease, aneurysm or dissection noted in the abdominal or pelvic vasculature. 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 No lymphadenopathy noted in the abdomen or pelvis. Reproductive: Prostate gland and seminal vesicles are unremarkable in appearance. Other: No significant volume of ascites.  No pneumoperitoneum. Musculoskeletal: There are no aggressive appearing lytic or blastic lesions noted in the visualized portions of the skeleton. IMPRESSION: 1. Findings are compatible with  acute appendicitis. No periappendiceal abscess or signs of frank perforation are noted at this time. Surgical consultation is recommended. 2. Chronic changes related to primary sclerosing cholangitis redemonstrated with increased intrahepatic biliary ductal dilatation, as above. 3. 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 4. 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 Electronically Signed   By: Vinnie Langton M.D.   On: 05/10/2020 08:33   DG Abd Portable 1V  Result Date: 05/15/2020 CLINICAL DATA:  Follow-up likely postoperative ileus after laparoscopic appendectomy (postop day 4). EXAM: PORTABLE ABDOMEN - 1 VIEW COMPARISON:  05/14/2020 and earlier. FINDINGS: Nasogastric tube tip projects over the gastric fundus. Numerous dilated loops of small bowel in the upper and mid abdomen have increased in caliber since the examinations yesterday and 05/13/2020. Gas is present throughout the decompressed colon. No suggestion of free air on the supine image. IMPRESSION: 1. Worsening postoperative ileus versus small-bowel obstruction with increase in caliber of the dilated small bowel loops in the upper abdomen. 2. Nasogastric tube tip projects over the gastric fundus. 3. No suggestion of free air on the supine image. Electronically Signed   By: Evangeline Dakin M.D.   On: 05/15/2020 10:02   DG Abd Portable 1V  Result Date: 05/14/2020 CLINICAL DATA:  Ileus EXAM: PORTABLE ABDOMEN - 1 VIEW COMPARISON:  05/13/2020 FINDINGS: Difficult on this single image centered low to determine exact position of the NG tube. Continued gaseous distention of bowel, with slight decreased small bowel distention since prior study. No organomegaly or suspicious calcification. IMPRESSION: Slight decreased gaseous distention of small bowel since prior study. Electronically Signed   By: Rolm Baptise M.D.   On: 05/14/2020 10:20   DG Abd Portable  1V  Result Date: 05/13/2020 CLINICAL DATA:  NG tube placement EXAM: PORTABLE ABDOMEN - 1 VIEW COMPARISON:  05/13/2020 FINDINGS: NG tube coils in the stomach. IMPRESSION: NG tube in the stomach. Electronically Signed   By: Rolm Baptise M.D.   On: 05/13/2020 09:52   DG Abd Portable 1V  Result Date: 05/13/2020 CLINICAL DATA:  Ileus EXAM: PORTABLE ABDOMEN - 1 VIEW COMPARISON:  May 12, 2020 FINDINGS: Nasogastric tube no longer appreciable. There remain multiple loops of dilated bowel without appreciable air-fluid level. No  free air. Lung bases clear. IMPRESSION: Persistent dilatation of multiple loops of small bowel. Nasogastric tube no longer evident. Suspect ileus, although a degree of bowel obstruction cannot be excluded on this study. No free air evident. Lung bases clear. Electronically Signed   By: Lowella Grip III M.D.   On: 05/13/2020 07:11   DG Abd Portable 1V  Result Date: 05/12/2020 CLINICAL DATA:  Encounter for imaging study to confirm nasogastric (NG) tube placement EXAM: PORTABLE ABDOMEN - 1 VIEW COMPARISON:  04/28/2018 FINDINGS: Nasogastric tube passes well below the diaphragm, curling within the proximal to mid stomach. There is increased bowel gas with mild small bowel dilation. Small-bowel dilation is similar to the prior study. IMPRESSION: Well-positioned nasogastric tube. Electronically Signed   By: Lajean Manes M.D.   On: 05/12/2020 11:37   [place] EKG SITE RITE  Result Date: 05/16/2020 If Site Rite image not attached, placement could not be confirmed due to current cardiac rhythm.   Lab Data:  CBC: Recent Labs  Lab 05/12/20 0458 05/13/20 0422 05/14/20 0359 05/15/20 0532 05/16/20 0556  WBC 6.7 8.2 6.0 5.9 7.3  HGB 13.1 13.5 12.4* 12.6* 12.4*  HCT 39.6 40.4 36.9* 38.4* 37.8*  MCV 92.3 92.0 90.9 92.5 92.2  PLT 81* 110* 107* 128* 998*   Basic Metabolic Panel: Recent Labs  Lab 05/11/20 0041 05/11/20 0041 05/12/20 0458 05/13/20 0422 05/14/20 0359 05/15/20 0532  05/16/20 0556  NA 139   < > 137 138 134* 137 136  K 3.1*   < > 3.1* 3.0* 3.0* 2.9* 3.2*  CL 103   < > 100 100 100 102 106  CO2 24   < > 25 23 26 24 22   GLUCOSE 122*   < > 147* 124* 114* 117* 111*  BUN 9   < > 7 10 8 6 7   CREATININE 1.01   < > 0.86 0.94 0.88 0.86 0.80  CALCIUM 9.0   < > 8.9 9.1 8.3* 9.1 8.6*  MG 1.7  --  1.9 2.1  --  2.0 2.0  PHOS  --   --   --   --   --   --  3.0   < > = values in this interval not displayed.   GFR: Estimated Creatinine Clearance: 186 mL/min (by C-G formula based on SCr of 0.8 mg/dL). Liver Function Tests: Recent Labs  Lab 05/10/20 0149  AST 27  ALT 22  ALKPHOS 46  BILITOT 1.0  PROT 8.7*  ALBUMIN 5.4*   Recent Labs  Lab 05/10/20 0149  LIPASE 26   No results for input(s): AMMONIA in the last 168 hours. Coagulation Profile: No results for input(s): INR, PROTIME in the last 168 hours. Cardiac Enzymes: No results for input(s): CKTOTAL, CKMB, CKMBINDEX, TROPONINI in the last 168 hours. BNP (last 3 results) No results for input(s): PROBNP in the last 8760 hours. HbA1C: No results for input(s): HGBA1C in the last 72 hours. CBG: No results for input(s): GLUCAP in the last 168 hours. Lipid Profile: No results for input(s): CHOL, HDL, LDLCALC, TRIG, CHOLHDL, LDLDIRECT in the last 72 hours. Thyroid Function Tests: No results for input(s): TSH, T4TOTAL, FREET4, T3FREE, THYROIDAB in the last 72 hours. Anemia Panel: No results for input(s): VITAMINB12, FOLATE, FERRITIN, TIBC, IRON, RETICCTPCT in the last 72 hours. Urine analysis:    Component Value Date/Time   COLORURINE YELLOW 05/10/2020 0901   APPEARANCEUR CLEAR 05/10/2020 0901   LABSPEC 1.010 05/10/2020 0901   PHURINE 8.0 05/10/2020 0901  GLUCOSEU NEGATIVE 05/10/2020 0901   HGBUR MODERATE (A) 05/10/2020 0901   BILIRUBINUR NEGATIVE 05/10/2020 0901   KETONESUR 40 (A) 05/10/2020 0901   PROTEINUR NEGATIVE 05/10/2020 0901   NITRITE NEGATIVE 05/10/2020 0901   LEUKOCYTESUR NEGATIVE  05/10/2020 0901     Faustina Gebert M.D. [facility] 05/16/2020, 12:14 PM   Call night coverage person covering after 7pm

## 2020-05-16 NOTE — Progress Notes (Signed)
Peripherally Inserted Central Catheter Placement  The IV Nurse has discussed with the patient and/or persons authorized to consent for the patient, the purpose of this procedure and the potential benefits and risks involved with this procedure.  The benefits include less needle sticks, lab draws from the catheter, and the patient may be discharged home with the catheter. Risks include, but not limited to, infection, bleeding, blood clot (thrombus formation), and puncture of an artery; nerve damage and irregular heartbeat and possibility to perform a PICC exchange if needed/ordered by physician.  Alternatives to this procedure were also discussed.  Bard Power PICC patient education guide, fact sheet on infection prevention and patient information card has been provided to patient /or left at bedside.  Mother at bedside.  PICC Placement Documentation  PICC Double Lumen 05/16/20 PICC Right Brachial 41 cm 0 cm (Active)  Indication for Insertion or Continuance of Line Administration of hyperosmolar/irritating solutions (i.e. TPN, Vancomycin, etc.) 05/16/20 1024  Exposed Catheter (cm) 0 cm 05/16/20 1024  Site Assessment Clean;Dry;Intact 05/16/20 1024  Lumen #1 Status Flushed;Saline locked;Blood return noted 05/16/20 1024  Lumen #2 Status Flushed;Saline locked;Blood return noted 05/16/20 1024  Dressing Type Transparent 05/16/20 1024  Dressing Status Clean;Dry;Intact;Antimicrobial disc in place 05/16/20 1024  Safety Lock Not Applicable 52/48/18 5909  Line Care Connections checked and tightened 05/16/20 1024  Line Adjustment (NICU/IV Team Only) No 05/16/20 1024  Dressing Intervention New dressing 05/16/20 1024  Dressing Change Due 05/23/20 05/16/20 1024       Kyle Gonzales 05/16/2020, 10:25 AM

## 2020-05-16 NOTE — Progress Notes (Signed)
5 Days Post-Op   Subjective/Chief Complaint: Pt with some flatus No BMs NGT in place   Objective: Vital signs in last 24 hours: Temp:  [97.9 F (36.6 C)-98.3 F (36.8 C)] 97.9 F (36.6 C) (08/01 0612) Pulse Rate:  [74-96] 93 (08/01 0612) Resp:  [18-20] 20 (08/01 0612) BP: (149-162)/(92-97) 149/97 (08/01 0612) SpO2:  [100 %] 100 % (08/01 0612) Last BM Date: 05/15/20  Intake/Output from previous day: 07/31 0701 - 08/01 0700 In: 2199.6 [P.O.:120; I.V.:1486.3; IV Piggyback:593.3] Out: 1700 [Urine:900; Emesis/NG output:800] Intake/Output this shift: No intake/output data recorded.  PE:  Constitutional: No acute distress, conversant, appears states age. Eyes: Anicteric sclerae, moist conjunctiva, no lid lag Lungs: Clear to auscultation bilaterally, normal respiratory effort CV: regular rate and rhythm, no murmurs, no peripheral edema, pedal pulses 2+ GI: Soft, no masses or hepatosplenomegaly, min-tender to palpation, distended, no rebound/guarding Skin: No rashes, palpation reveals normal turgor Psychiatric: appropriate judgment and insight, oriented to person, place, and time  Lab Results:  Recent Labs    05/15/20 0532 05/16/20 0556  WBC 5.9 7.3  HGB 12.6* 12.4*  HCT 38.4* 37.8*  PLT 128* 137*   BMET Recent Labs    05/15/20 0532 05/16/20 0556  NA 137 136  K 2.9* 3.2*  CL 102 106  CO2 24 22  GLUCOSE 117* 111*  BUN 6 7  CREATININE 0.86 0.80  CALCIUM 9.1 8.6*   PT/INR No results for input(s): LABPROT, INR in the last 72 hours. ABG No results for input(s): PHART, HCO3 in the last 72 hours.  Invalid input(s): PCO2, PO2  Studies/Results: DG Abd Portable 1V  Result Date: 05/15/2020 CLINICAL DATA:  Follow-up likely postoperative ileus after laparoscopic appendectomy (postop day 4). EXAM: PORTABLE ABDOMEN - 1 VIEW COMPARISON:  05/14/2020 and earlier. FINDINGS: Nasogastric tube tip projects over the gastric fundus. Numerous dilated loops of small bowel in the  upper and mid abdomen have increased in caliber since the examinations yesterday and 05/13/2020. Gas is present throughout the decompressed colon. No suggestion of free air on the supine image. IMPRESSION: 1. Worsening postoperative ileus versus small-bowel obstruction with increase in caliber of the dilated small bowel loops in the upper abdomen. 2. Nasogastric tube tip projects over the gastric fundus. 3. No suggestion of free air on the supine image. Electronically Signed   By: Evangeline Dakin M.D.   On: 05/15/2020 10:02   DG Abd Portable 1V  Result Date: 05/14/2020 CLINICAL DATA:  Ileus EXAM: PORTABLE ABDOMEN - 1 VIEW COMPARISON:  05/13/2020 FINDINGS: Difficult on this single image centered low to determine exact position of the NG tube. Continued gaseous distention of bowel, with slight decreased small bowel distention since prior study. No organomegaly or suspicious calcification. IMPRESSION: Slight decreased gaseous distention of small bowel since prior study. Electronically Signed   By: Rolm Baptise M.D.   On: 05/14/2020 10:20    Anti-infectives: Anti-infectives (From admission, onward)   Start     Dose/Rate Route Frequency Ordered Stop   05/11/20 1800  piperacillin-tazobactam (ZOSYN) IVPB 3.375 g     Discontinue     3.375 g 12.5 mL/hr over 240 Minutes Intravenous Every 8 hours 05/11/20 1625     05/11/20 1000  cefTRIAXone (ROCEPHIN) 2 g in sodium chloride 0.9 % 100 mL IVPB  Status:  Discontinued       "And" Linked Group Details   2 g 200 mL/hr over 30 Minutes Intravenous Every 24 hours 05/10/20 1350 05/11/20 1540   05/10/20 1800  metroNIDAZOLE (  FLAGYL) IVPB 500 mg  Status:  Discontinued       "And" Linked Group Details   500 mg 100 mL/hr over 60 Minutes Intravenous Every 8 hours 05/10/20 1350 05/11/20 1540   05/10/20 0915  cefTRIAXone (ROCEPHIN) 2 g in sodium chloride 0.9 % 100 mL IVPB       "And" Linked Group Details   2 g 200 mL/hr over 30 Minutes Intravenous  Once 05/10/20 0902  05/10/20 1223   05/10/20 0915  metroNIDAZOLE (FLAGYL) IVPB 500 mg       "And" Linked Group Details   500 mg 100 mL/hr over 60 Minutes Intravenous  Once 05/10/20 0902 05/10/20 1115      Assessment/Plan: HTN HLD Grave's disease 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 -s/pLaparoscopic debridement of abscess containing yellow pus and thick fibrinous exudatesby Dr. Hassell Done on 05/11/2020 - POD #5 - persistent ileus - Continue abx - Mobilize(in halls) - IS - Keep K >4 and Mg > 2 for bowel function -Pathw disrupted fibromuscular tissue with marked acute inflammation, consistent with clinically stated appendix with appendicitis   FEN -NPO,NGT,IVF, replace K, Will order PICC/TNA VTE -SCDs, IV Heparin, continue to hold eliquis ID -Rocephin/Flagyl7/26 - 7/27. Zosyn 7/27 >>day#5 Foley - None Follow-Up- Dr. Hassell Done  Plan: Continue NPO/NGT and await return in bowel function. Continue mobilizing, minimize narcotics. Monitor electrolytes, K to be replaced. Continue IV antibiotics Will plan PICC/TNA today   LOS: 6 days    Ralene Ok 05/16/2020

## 2020-05-16 NOTE — Progress Notes (Signed)
[place] for IV heparin Indication: protein S deficiency, SMV thrombus (bridging while off Eliquis)  Allergies  Allergen Reactions   Tylenol [Acetaminophen] Other (See Comments)    r/t elevated LFTs     Patient Measurements: Height: 6\' 3"  (190.5 cm) Weight: (!) 110.3 kg (243 lb 3.2 oz) IBW/kg (Calculated) : 84.5 Heparin Dosing Weight: 107 kg  Vital Signs: Temp: 97.9 F (36.6 C) (08/01 0612) Temp Source: Oral (08/01 0612) BP: 149/97 (08/01 0612) Pulse Rate: 93 (08/01 0612)  Labs: Recent Labs    05/14/20 0359 05/14/20 0359 05/14/20 1032 05/15/20 0532 05/16/20 0556  HGB 12.4*   < >  --  12.6* 12.4*  HCT 36.9*  --   --  38.4* 37.8*  PLT 107*  --   --  128* 137*  HEPARINUNFRC 0.45   < > 0.46 <0.10* 0.59  CREATININE 0.88  --   --  0.86  --    < > = values in this interval not displayed.    Estimated Creatinine Clearance: 173 mL/min (by C-G formula based on SCr of 0.86 mg/dL).  Assessment: 53 yoM with PMH of SMV thrombus and protein S deficiency on Eliquis PTA, admitted 7/26 for abdominal pain. Found to have acute appendicitis and Eliquis held to allow for appendectomy. Pharmacy to bridge with heparin perioperatively.    Baseline heparin level slightly elevated at 0.46 as anticipated due to lab-drug interaction with apixaban, aPTT WNL at 23  Prior anticoagulation: Eliquis 5 mg PO bid, last dose 7/25 AM  Significant events:    7/27 s/pLaparoscopic debridement of abscess containing yellow pus and thick fibrinous exudatesby Dr. Hassell Done - heparin resumed 6 hrs after surgery w/ no bolus  7/29 AM: vomited medium amt of bright red blood in toilet. Heparin IV stopped at 0400; resumed at 1330 at lower rate  7/30: heparin held again for blood in NGT drainage - per [facility], defer resumption to Surgery  7/31: resume heparin at 1400 at previous rate per Dr. Rosendo Gros with Surgery  Today, 05/16/2020:  Surgery OK to resume heparin at previous  rate; no bolus  CBC: Hgb stable WNL; Pltup to 137, trending up, now back to near normal  SCr stable WNL  No reports of any more bleeding this AM per RN; thinking it may have been d/t irritation from NGT  Goal of Therapy: Heparin level 0.3-0.7 units/ml Monitor platelets by anticoagulation protocol: Yes  Plan:  Resume IV heparin at 1900 units/hr  Daily CBC and heparin level  Monitor for signs of bleeding or thrombosis  F/u resolution of ileus and ability to resume PTA Eliquis  Royetta Asal, PharmD, BCPS 05/16/2020 6:55 AM

## 2020-05-17 DIAGNOSIS — K55069 Acute infarction of intestine, part and extent unspecified: Secondary | ICD-10-CM

## 2020-05-17 DIAGNOSIS — K3521 Acute appendicitis with generalized peritonitis, with abscess: Secondary | ICD-10-CM

## 2020-05-17 DIAGNOSIS — E782 Mixed hyperlipidemia: Secondary | ICD-10-CM

## 2020-05-17 DIAGNOSIS — E876 Hypokalemia: Secondary | ICD-10-CM

## 2020-05-17 LAB — DIFFERENTIAL
Abs Immature Granulocytes: 0.15 10*3/uL — ABNORMAL HIGH (ref 0.00–0.07)
Basophils Absolute: 0 10*3/uL (ref 0.0–0.1)
Basophils Relative: 0 %
Eosinophils Absolute: 0 10*3/uL (ref 0.0–0.5)
Eosinophils Relative: 1 %
Immature Granulocytes: 2 %
Lymphocytes Relative: 19 %
Lymphs Abs: 1.3 10*3/uL (ref 0.7–4.0)
Monocytes Absolute: 0.9 10*3/uL (ref 0.1–1.0)
Monocytes Relative: 14 %
Neutro Abs: 4.3 10*3/uL (ref 1.7–7.7)
Neutrophils Relative %: 64 %

## 2020-05-17 LAB — TRIGLYCERIDES: Triglycerides: 70 mg/dL (ref <150)

## 2020-05-17 LAB — HEPARIN LEVEL (UNFRACTIONATED): Heparin Unfractionated: 0.57 IU/mL (ref 0.30–0.70)

## 2020-05-17 LAB — COMPREHENSIVE METABOLIC PANEL
ALT: 21 U/L (ref 0–44)
AST: 16 U/L (ref 15–41)
Albumin: 3.3 g/dL — ABNORMAL LOW (ref 3.5–5.0)
Alkaline Phosphatase: 37 U/L — ABNORMAL LOW (ref 38–126)
Anion gap: 14 (ref 5–15)
BUN: 5 mg/dL — ABNORMAL LOW (ref 6–20)
CO2: 20 mmol/L — ABNORMAL LOW (ref 22–32)
Calcium: 8.4 mg/dL — ABNORMAL LOW (ref 8.9–10.3)
Chloride: 102 mmol/L (ref 98–111)
Creatinine, Ser: 0.88 mg/dL (ref 0.61–1.24)
GFR calc Af Amer: >60 mL/min (ref >60)
GFR calc non Af Amer: >60 mL/min (ref >60)
Glucose, Bld: 93 mg/dL (ref 70–99)
Potassium: 3.3 mmol/L — ABNORMAL LOW (ref 3.5–5.1)
Sodium: 136 mmol/L (ref 135–145)
Total Bilirubin: 0.9 mg/dL (ref 0.3–1.2)
Total Protein: 6.5 g/dL (ref 6.5–8.1)

## 2020-05-17 LAB — PHOSPHORUS: Phosphorus: 3.6 mg/dL (ref 2.5–4.6)

## 2020-05-17 LAB — CBC
HCT: 34.9 % — ABNORMAL LOW (ref 39.0–52.0)
Hemoglobin: 11.5 g/dL — ABNORMAL LOW (ref 13.0–17.0)
MCH: 30.7 pg (ref 26.0–34.0)
MCHC: 33 g/dL (ref 30.0–36.0)
MCV: 93.1 fL (ref 80.0–100.0)
Platelets: 129 10*3/uL — ABNORMAL LOW (ref 150–400)
RBC: 3.75 MIL/uL — ABNORMAL LOW (ref 4.22–5.81)
RDW: 13.8 % (ref 11.5–15.5)
WBC: 6.6 10*3/uL (ref 4.0–10.5)
nRBC: 0 % (ref 0.0–0.2)

## 2020-05-17 LAB — MAGNESIUM: Magnesium: 2 mg/dL (ref 1.7–2.4)

## 2020-05-17 LAB — GLUCOSE, CAPILLARY: Glucose-Capillary: 79 mg/dL (ref 70–99)

## 2020-05-17 LAB — PREALBUMIN: Prealbumin: 10.2 mg/dL — ABNORMAL LOW (ref 18–38)

## 2020-05-17 MED ORDER — POTASSIUM CHLORIDE 10 MEQ/50ML IV SOLN
10.0000 meq | INTRAVENOUS | Status: AC
  Administered 2020-05-17 (×5): 10 meq via INTRAVENOUS
  Filled 2020-05-17 (×5): qty 50

## 2020-05-17 MED ORDER — DEXTROSE 10 % IV SOLN: INTRAVENOUS | Status: AC

## 2020-05-17 MED ORDER — EZETIMIBE 10 MG PO TABS
10.0000 mg | ORAL_TABLET | Freq: Every day | ORAL | Status: DC
  Administered 2020-05-17 – 2020-05-18 (×2): 10 mg via ORAL
  Filled 2020-05-17 (×2): qty 1

## 2020-05-17 MED ORDER — AMLODIPINE BESYLATE 5 MG PO TABS
2.5000 mg | ORAL_TABLET | Freq: Every day | ORAL | Status: DC
  Administered 2020-05-17 – 2020-05-18 (×2): 2.5 mg via ORAL
  Filled 2020-05-17 (×2): qty 1

## 2020-05-17 MED ORDER — METOPROLOL TARTRATE 25 MG PO TABS: 25.0000 mg | ORAL_TABLET | Freq: Two times a day (BID) | ORAL | Status: DC

## 2020-05-17 MED ORDER — LEVOTHYROXINE SODIUM 100 MCG PO TABS
100.0000 ug | ORAL_TABLET | Freq: Every day | ORAL | Status: DC
  Administered 2020-05-18: 06:00:00 100 ug via ORAL
  Filled 2020-05-17: qty 1

## 2020-05-17 MED ORDER — BOOST / RESOURCE BREEZE PO LIQD CUSTOM
1.0000 | Freq: Three times a day (TID) | ORAL | Status: DC
  Administered 2020-05-17 – 2020-05-18 (×4): 1 via ORAL

## 2020-05-17 MED ORDER — TRAVASOL 10 % IV SOLN
INTRAVENOUS | Status: DC
  Filled 2020-05-17: qty 480

## 2020-05-17 MED ORDER — ESCITALOPRAM OXALATE 20 MG PO TABS
20.0000 mg | ORAL_TABLET | Freq: Every day | ORAL | Status: DC
  Administered 2020-05-17 – 2020-05-18 (×2): 20 mg via ORAL
  Filled 2020-05-17 (×2): qty 1

## 2020-05-17 MED ORDER — BISOPROLOL FUMARATE 5 MG PO TABS
10.0000 mg | ORAL_TABLET | Freq: Every day | ORAL | Status: DC
  Administered 2020-05-17 – 2020-05-18 (×2): 10 mg via ORAL
  Filled 2020-05-17 (×2): qty 2

## 2020-05-17 MED FILL — LEVOTHYROXINE SODIUM 100 MC: 100 | 90 days supply | Qty: 90 | Fill #0

## 2020-05-17 NOTE — Progress Notes (Signed)
[place] for IV heparin Indication: protein S deficiency, SMV thrombus (bridging while off Eliquis)  Allergies  Allergen Reactions   Tylenol [Acetaminophen] Other (See Comments)    r/t elevated LFTs     Patient Measurements: Height: 6\' 3"  (190.5 cm) Weight: (!) 110.3 kg (243 lb 3.2 oz) IBW/kg (Calculated) : 84.5 Heparin Dosing Weight: 107 kg  Vital Signs: Temp: 98.3 F (36.8 C) (08/02 0526) Temp Source: Oral (08/02 0526) BP: 150/84 (08/02 0642) Pulse Rate: 103 (08/02 0642)  Labs: Recent Labs    05/15/20 0532 05/15/20 0532 05/16/20 0556 05/17/20 0305  HGB 12.6*   < > 12.4* 11.5*  HCT 38.4*  --  37.8* 34.9*  PLT 128*  --  137* 129*  HEPARINUNFRC <0.10*  --  0.59 0.57  CREATININE 0.86  --  0.80 0.88   < > = values in this interval not displayed.    Estimated Creatinine Clearance: 169.1 mL/min (by C-G formula based on SCr of 0.88 mg/dL).  Assessment: 24 yoM with PMH of SMV thrombus and protein S deficiency on Eliquis PTA, admitted 7/26 for abdominal pain. Found to have acute appendicitis and Eliquis held to allow for appendectomy. Pharmacy to bridge with heparin perioperatively.    Baseline heparin level slightly elevated at 0.46 as anticipated due to lab-drug interaction with apixaban, aPTT WNL at 23  Prior anticoagulation: Eliquis 5 mg PO bid, last dose 7/25 AM  Significant events:    7/27 s/pLaparoscopic debridement of abscess containing yellow pus and thick fibrinous exudatesby Dr. Hassell Done - heparin resumed 6 hrs after surgery w/ no bolus  7/29 AM: vomited medium amt of bright red blood in toilet. Heparin IV stopped at 0400; resumed at 1330 at lower rate  7/30: heparin held again for blood in NGT drainage - per [facility], defer resumption to Surgery  7/31: resume heparin at 1400 at previous rate with no bolus per Dr. Rosendo Gros with Surgery  Today, 05/17/2020:  HL = 0.57 remains therapeutic on heparin infusion of 1900  units/hr  CBC: Hgb 11.5 slightly low/decreased; Plt 129 - slightly low but stable  SCr stable WNL  Confirmed with RN that heparin infusing at correct rate; no signs/symptoms of bleeding  Goal of Therapy: Heparin level 0.3-0.7 units/ml Monitor platelets by anticoagulation protocol: Yes  Plan:  Continue heparin infusion at current rate of 1900 units/hr   Daily CBC and heparin level  Monitor for signs of bleeding or thrombosis  F/u resolution of ileus and ability to resume PTA Eliquis  Lenis Noon, PharmD 05/17/20 8:20 AM

## 2020-05-17 NOTE — Progress Notes (Signed)
Initial Nutrition Assessment  INTERVENTION:   -TPN management per Pharmacy -Boost Breeze po TID, each supplement provides 250 kcal and 9 grams of protein -Will monitor for further diet advancement -Recommend updated weight for patient  NUTRITION DIAGNOSIS:   Inadequate oral intake related to nausea, vomiting (prolonged ileus) as evidenced by NPO status.  GOAL:   Patient will meet greater than or equal to 90% of their needs  MONITOR:   PO intake, Supplement acceptance, Diet advancement, Labs, Weight trends, I & O's (TPN)  REASON FOR ASSESSMENT:   Consult New TPN/TNA  ASSESSMENT:   29 year old male with past medical history of primary sclerosing cholangitis follows with Dr. Havery Moros, protein S deficiency with history of SMV thrombosis years ago on Eliquis, hypertension, hyperlipidemia, depression, Graves' disease s/p RAI ablation with acquired hypothyroidism who presented to the [facility] ED on 7/26 with severe sharp abdominal pain x 2 hours after eating chinese food which was initially generalized associated with vomiting and without diarrhea.  CT abdomen pelvis at [facility] showed acute nonperforated appendicitis.  7/26: admitted for acute appendicitis 7/27: s/p Laparoscopic debridement of abscess   Pt in room with mother at bedside. Pt reports he tolerated clears well this morning orange juice and chicken broth. Pt is willing to try Boost Breeze as well for additional protein. Pt asking when he can have diet advanced and when he will be discharged. States he feels better. Has been having BMs.   Pt has not had his diet advanced beyond clears since admission . Poor nutrition intake since he had some chinese food on 7/25 for dinner (8 days now). Pt instantly began having abdominal pain and vomiting.   TPN was expected to start yesterday but was unable to d/t tubing issues. Pt was started back on clears this morning.   Pt reports UBW of 244 lbs. Admission weight recorded as 243 lbs. Pt  and pt's mother curious what his weight is now. Recommend weight for pt.  I/Os: +9.2L since admit UOP: 575 ml x 24 hrs NGT: d/c now  Medications: D10 infusion, KCl, IV Zofran, IV Compazine  Labs reviewed: Low K Mg/Phos WNL TG: 70  NUTRITION - FOCUSED PHYSICAL EXAM:  No depletions noted  Diet Order:   Diet Order            Diet clear liquid Room service appropriate? Yes; Fluid consistency: Thin  Diet effective now                 EDUCATION NEEDS:   Education needs have been addressed  Skin:  Skin Assessment: Reviewed RN Assessment  Last BM:  8/2  Height:   Ht Readings from Last 1 Encounters:  05/10/20 6\' 3"  (1.905 m)    Weight:   Wt Readings from Last 1 Encounters:  05/10/20 (!) 110.3 kg   BMI:  Body mass index is 30.4 kg/m.  Estimated Nutritional Needs:   Kcal:  9242-6834  Protein:  100-110g  Fluid:  2.2L/day  Clayton Bibles, MS, RD, LDN Inpatient Clinical Dietitian Contact information available via Amion

## 2020-05-17 NOTE — Progress Notes (Signed)
PROGRESS NOTE    Kyle Gonzales  WUJ:[id number]  DOB: 07-Apr-1991  PCP: [name], MD [place] date:05/10/2020 Chief compliant: Abdominal pain Hospital course:29 year old male with past medical history of primary sclerosing cholangitisfollowswith Dr. Havery Moros, protein S deficiency with history of SMV thrombosis years ago on Eliquis, hypertension, hyperlipidemia, depression, Graves' disease s/p RAI ablation with acquired hypothyroidism who presented to [facility] on 7/26 with severe sharp abdominal pain x 2 hoursafter eatingchinese foodwhich was initially generalized associated with vomiting and without diarrhea. CT abdomen pelvis at [facility] acute nonperforated appendicitis. He was given ceftriaxone and Flagyl transferred to [facility] after ED physician had discussion with Dr. Hassell Done, general surgery on-call.  Eliquis was held and patient was admitted for further management.  Patient underwent laparoscopic surgery with findings of chronic ruptured appendicitis with abscess/unrecognizable structures involving the urachus and pelvis.  He underwent appendectomy and debridement of abscess containing yellow pus and thick fibrinous exudates by Dr. Hassell Done on 7/27.  Postoperative course complicated by ileus requiring NG tube placement.  Subjective:  Seen by general surgery this morning and NG tube out.  Patient happy that he is able to eat.  Tolerated clear liquid diet well for lunch.  Reports several bowel movements yesterday and today.  Mother at bedside.  Remains on IV antibiotics/IV heparin.  Blood pressure noted to be elevated  Objective: Vitals:   05/16/20 2127 05/16/20 2317 05/17/20 0526 05/17/20 0642  BP: (!) 190/105 (!) 157/79 (!) 169/101 (!) 150/84  Pulse: (!) 110 92 95 103  Resp: 16  16   Temp: 98.9 F (37.2 C)  98.3 F (36.8 C)   TempSrc: Oral  Oral   SpO2: 100%  97%   Weight:      Height:        Intake/Output Summary (Last 24 hours) at 05/17/2020 1341 Last data filed at  05/17/2020 0600 Gross per 24 hour  Intake 2852.85 ml  Output 1275 ml  Net 1577.85 ml   Filed Weights   05/10/20 0716  Weight: (!) 110.3 kg    Physical Examination: General: Moderately built, no acute distress noted Head ENT: Atraumatic normocephalic, PERRLA, neck supple Heart: S1-S2 heard, regular rate and rhythm, no murmurs.  No leg edema noted Lungs: Equal air entry bilaterally, no rhonchi or rales on exam, no accessory muscle use Abdomen: Healing scars from laparoscopic entry site with Steri-Strips in place.  Bowel sounds heard, soft, nontender, nondistended. No CVA tenderness Extremities: No pedal edema.  No cyanosis or clubbing. Neurological: Awake alert oriented x3, no focal weakness or numbness, strength and sensations to crude touch intact    Data Reviewed: I have personally reviewed following labs and imaging studies  CBC: Recent Labs  Lab 05/13/20 0422 05/14/20 0359 05/15/20 0532 05/16/20 0556 05/17/20 0305  WBC 8.2 6.0 5.9 7.3 6.6  NEUTROABS  --   --   --   --  4.3  HGB 13.5 12.4* 12.6* 12.4* 11.5*  HCT 40.4 36.9* 38.4* 37.8* 34.9*  MCV 92.0 90.9 92.5 92.2 93.1  PLT 110* 107* 128* 137* 956*   Basic Metabolic Panel: Recent Labs  Lab 05/12/20 0458 05/12/20 0458 05/13/20 0422 05/14/20 0359 05/15/20 0532 05/16/20 0556 05/17/20 0305  NA 137   < > 138 134* 137 136 136  K 3.1*   < > 3.0* 3.0* 2.9* 3.2* 3.3*  CL 100   < > 100 100 102 106 102  CO2 25   < > 23 26 24 22  20*  GLUCOSE 147*   < > 124*  114* 117* 111* 93  BUN 7   < > 10 8 6 7  5*  CREATININE 0.86   < > 0.94 0.88 0.86 0.80 0.88  CALCIUM 8.9   < > 9.1 8.3* 9.1 8.6* 8.4*  MG 1.9  --  2.1  --  2.0 2.0 2.0  PHOS  --   --   --   --   --  3.0 3.6   < > = values in this interval not displayed.   GFR: Estimated Creatinine Clearance: 169.1 mL/min (by C-G formula based on SCr of 0.88 mg/dL). Liver Function Tests: Recent Labs  Lab 05/17/20 0305  AST 16  ALT 21  ALKPHOS 37*  BILITOT 0.9  PROT 6.5    ALBUMIN 3.3*   No results for input(s): LIPASE, AMYLASE in the last 168 hours. No results for input(s): AMMONIA in the last 168 hours. Coagulation Profile: No results for input(s): INR, PROTIME in the last 168 hours. Cardiac Enzymes: No results for input(s): CKTOTAL, CKMB, CKMBINDEX, TROPONINI in the last 168 hours. BNP (last 3 results) No results for input(s): PROBNP in the last 8760 hours. HbA1C: No results for input(s): HGBA1C in the last 72 hours. CBG: No results for input(s): GLUCAP in the last 168 hours. Lipid Profile: Recent Labs    05/17/20 0305  TRIG 70   Thyroid Function Tests: No results for input(s): TSH, T4TOTAL, FREET4, T3FREE, THYROIDAB in the last 72 hours. Anemia Panel: No results for input(s): VITAMINB12, FOLATE, FERRITIN, TIBC, IRON, RETICCTPCT in the last 72 hours. Sepsis Labs: No results for input(s): PROCALCITON, LATICACIDVEN in the last 168 hours.  Recent Results (from the past 240 hour(s))  SARS Coronavirus 2 by RT PCR (hospital order, performed in [facility] hospital lab) Nasopharyngeal Nasopharyngeal Swab     Status: None   Collection Time: 05/10/20  9:31 AM   Specimen: Nasopharyngeal Swab  Result Value Ref Range Status   SARS Coronavirus 2 NEGATIVE NEGATIVE Final    Comment: (NOTE) SARS-CoV-2 target nucleic acids are NOT DETECTED.  The SARS-CoV-2 RNA is generally detectable in upper and lower respiratory specimens during the acute phase of infection. The lowest concentration of SARS-CoV-2 viral copies this assay can detect is 250 copies / mL. A negative result does not preclude SARS-CoV-2 infection and should not be used as the sole basis for treatment or other patient management decisions.  A negative result may occur with improper specimen collection / handling, submission of specimen other than nasopharyngeal swab, presence of viral mutation(s) within the areas targeted by this assay, and inadequate number of viral copies (<250 copies /  mL). A negative result must be combined with clinical observations, patient history, and epidemiological information.  Fact Sheet for Patients:   StrictlyIdeas.no  Fact Sheet for Healthcare Providers: BankingDealers.co.za  This test is not yet approved or  cleared by the [place] FDA and has been authorized for detection and/or diagnosis of SARS-CoV-2 by FDA under an Emergency Use Authorization (EUA).  This EUA will remain in effect (meaning this test can be used) for the duration of the COVID-19 declaration under Section 564(b)(1) of the Act, 21 U.S.C. section 360bbb-3(b)(1), unless the authorization is terminated or revoked sooner.  Performed at [facility], [address] [place], [place] [address]   Surgical pcr screen     Status: None   Collection Time: 05/11/20  1:22 AM   Specimen: Nasal Mucosa; Nasal Swab  Result Value Ref Range Status   MRSA, PCR NEGATIVE  NEGATIVE Final   Staphylococcus aureus NEGATIVE NEGATIVE Final    Comment: (NOTE) The Xpert SA Assay (FDA approved for NASAL specimens in patients 61 years of age and older), is one component of a comprehensive surveillance program. It is not intended to diagnose infection nor to guide or monitor treatment. Performed at [facility], [place] [address], [place], [place] [address]       Radiology Studies: [place] EKG SITE RITE  Result Date: 05/16/2020 If Site Rite image not attached, placement could not be confirmed due to current cardiac rhythm.     Scheduled Meds:  busPIRone  10 mg Oral TID   Chlorhexidine Gluconate Cloth  6 each Topical Daily   feeding supplement  1 Container Oral TID BM   insulin aspart  0-9 Units Subcutaneous Q6H   levothyroxine  50 mcg Intravenous Q0600   metoprolol tartrate  5 mg Intravenous Q6H   pantoprazole (PROTONIX) IV  40 mg Intravenous BID   Continuous Infusions:  sodium chloride 60 mL/hr at  05/17/20 1222   dextrose 40 mL/hr at 05/17/20 1219   heparin 1,900 Units/hr (05/17/20 0801)   methocarbamol (ROBAXIN) IV     piperacillin-tazobactam (ZOSYN)  IV 3.375 g (05/17/20 0920)   potassium chloride 10 mEq (05/17/20 1146)   TPN ADULT (ION) 40 mL/hr at 05/16/20 1852   TPN ADULT (ION)        Assessment/Plan:  Chronic ruptured appendicitis with abscess: Patient underwent laparoscopic debridement of the abscess containing yellow pus and thick fibrinous exudates by GS on 7/27.  Has been n.p.o. with NGT, IV fluids and IV antibiotics.  Off NG tube today and started on clear liquid diet by general surgery.  Will continue IV antibiotics for today.  Reduced IV fluids to 60 mL/h.  Can likely come off TPN/IV fluids in a.m. if able to advance diet and can likely DC PICC line.  Postoperative ileus: Improving with several bowel movements in the last 24 hours.  Monitor diet tolerance with CLD.  Advance as tolerated.  Appreciate general surgery assistance and follow-up.  History of protein S deficiency with history of SMV thrombosis- Continue to hold Eliquis until diet tolerance ensured- cont heparin gtt  for today  Essential hypertension: Patient has had elevated blood pressure readings felt due to discomfort/ileus and anxiety.  He appears pretty calm today but blood pressure still elevated.  On scheduled IV Lopressor-changed to p.o. metoprolol.  Will add low-dose Norvasc.  Mild acute kidney injury: .Creatinine 1.29 on admission.  Creatinine down to 0.8 with IV fluids.  Now off NG tube and can likely DC fluids in a.m. once diet tolerance ensured.  Hypokalemia-improved with replacement therapy but still low at 3.3.  Will give additional replacement today.  Will likely be steady once off IV fluids and no further GI losses through NG tube.  History of Graves' disease status post RAI ablation now with acquired hypothyroidism-TSH 9.45, on 7/21.  Continue Synthroid 125 MCG, appears to have been  increased at admission.  Change IV Synthroid to p.o.  Hyperlipidemia-resume home medications.  Thrombocytopenia-in the setting of acute infection. Appears to be uptrending now  History of primary sclerosing cholangitis-Chronic changes noted on CT scan. [facility].  Obesity-Estimated body mass index is 30.4 kg/m as calculated from the following:Height as of this encounter: 6\' 3"  (1.905 m).Weight as of this encounter: 110.3 kg.  DVT prophylaxis: On heparin drip Code Status: Full code Family / Patient Communication: Discussed with patient and with mother  at bedside Disposition Plan:   Status is: Inpatient  Remains inpatient appropriate because:IV treatments appropriate due to intensity of illness or inability to take PO   Dispo:  Patient From: Home  Planned Disposition: Home  Expected discharge date: 05/19/20  Medically stable for discharge: No           Time spent: 35 minutes     >50% time spent in discussions with care team and coordination of care.    Guilford Shi, MD [facility] Pager in [place]  If 7PM-7AM, please contact night-coverage [email or web address] 05/17/2020, 1:41 PM

## 2020-05-17 NOTE — Progress Notes (Addendum)
PHARMACY - TOTAL PARENTERAL NUTRITION CONSULT NOTE   Indication: prolonged ileus  Patient Measurements: Height: 6' 3"  (190.5 cm) Weight: (!) 110.3 kg (243 lb 3.2 oz) IBW/kg (Calculated) : 84.5 TPN AdjBW (KG): 91 Body mass index is 30.4 kg/m. 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 Taken to OR for acute appendicitis and found to have chronic ruptured appendicitis with abscess and significant distortions of RLQ anatomy including adhesions with lower abdominal wall. Some flatus but no BM on POD5; Dx with persistent ileus and pharmacy consulted to dose TPN  CBGs (goal 100-150): CBG WNL, range 93-111 - no Hx DM Electrolytes: K (3.3) low. All other lytes WNL including CorrCa (9). Goal K >4, Mg > 2.  Renal: SCr, BUN - stable, WNL; UOP unmeasured Hepatic: LFTs WNL, Alk Phos slightly low, Tbili WNL Prealbumin / TG: Prealbumin low (10.2); TG WNL (70) I/O: 700 ml emesis/NG output/24 hours; MIVF NaCl @ 60 mL/hr GI Imaging: - 7/29: persistent dilation of SB loops - 7/31: worsening ileus Surgeries / Procedures:  - 7/27: laparoscopic debridement of abscess and appendectomy  Central access: Double lumen PICC placed 8/1 TPN start date: 8/2 anticipated  Nutritional Goals RD recs pending  Current Nutrition:  -NPO -TPN planned to be initiated on 8/1, but delayed due to issue with IV tubing  Plan: Now:  Infuse dextrose 10% @ 40 mL/hr  KCl 10 mEq IV x 5   At 1800:  Stop D10 infusion  Start TPN at 40 mL/hr  Electrolytes in TPN: standard  Na - 64mq/L  K - 548m/L  Ca - 53m34mL  Mg - 53mE64m  Phos - 153mm36m  Cl:Ac ratio 1:1  Add standard MVI and trace elements to TPN  Continue Sensitive q6h SSI and adjust as needed   Continue MIVF at 60 mL/hr at  1800  Monitor TPN labs on Mon/Thurs; recheck electrolytes with AM labs tomorrow  Kyle Gonzales 05/17/20 9:48 AM

## 2020-05-17 NOTE — Progress Notes (Signed)
6 Days Post-Op   Subjective/Chief Complaint: No complaints. Feels better. Having bm's   Objective: Vital signs in last 24 hours: Temp:  [98.3 F (36.8 C)-98.9 F (37.2 C)] 98.3 F (36.8 C) (08/02 0526) Pulse Rate:  [81-110] 103 (08/02 0642) Resp:  [16-18] 16 (08/02 0526) BP: (150-190)/(79-115) 150/84 (08/02 0642) SpO2:  [97 %-100 %] 97 % (08/02 0526) Last BM Date: 05/15/20  Intake/Output from previous day: 08/01 0701 - 08/02 0700 In: 3522.9 [P.O.:160; I.V.:2515.1; IV Piggyback:847.9] Out: 1275 [Urine:575; Emesis/NG output:700] Intake/Output this shift: No intake/output data recorded.  General appearance: alert and cooperative Resp: clear to auscultation bilaterally Cardio: regular rate and rhythm GI: soft, nontender. incisions look good. good bs  Lab Results:  Recent Labs    05/16/20 0556 05/17/20 0305  WBC 7.3 6.6  HGB 12.4* 11.5*  HCT 37.8* 34.9*  PLT 137* 129*   BMET Recent Labs    05/16/20 0556 05/17/20 0305  NA 136 136  K 3.2* 3.3*  CL 106 102  CO2 22 20*  GLUCOSE 111* 93  BUN 7 5*  CREATININE 0.80 0.88  CALCIUM 8.6* 8.4*   PT/INR No results for input(s): LABPROT, INR in the last 72 hours. ABG No results for input(s): PHART, HCO3 in the last 72 hours.  Invalid input(s): PCO2, PO2  Studies/Results: DG Abd Portable 1V  Result Date: 05/15/2020 CLINICAL DATA:  Follow-up likely postoperative ileus after laparoscopic appendectomy (postop day 4). EXAM: PORTABLE ABDOMEN - 1 VIEW COMPARISON:  05/14/2020 and earlier. FINDINGS: Nasogastric tube tip projects over the gastric fundus. Numerous dilated loops of small bowel in the upper and mid abdomen have increased in caliber since the examinations yesterday and 05/13/2020. Gas is present throughout the decompressed colon. No suggestion of free air on the supine image. IMPRESSION: 1. Worsening postoperative ileus versus small-bowel obstruction with increase in caliber of the dilated small bowel loops in the  upper abdomen. 2. Nasogastric tube tip projects over the gastric fundus. 3. No suggestion of free air on the supine image. Electronically Signed   By: Evangeline Dakin M.D.   On: 05/15/2020 10:02   [place] EKG SITE RITE  Result Date: 05/16/2020 If Site Rite image not attached, placement could not be confirmed due to current cardiac rhythm.   Anti-infectives: Anti-infectives (From admission, onward)   Start     Dose/Rate Route Frequency Ordered Stop   05/11/20 1800  piperacillin-tazobactam (ZOSYN) IVPB 3.375 g     Discontinue     3.375 g 12.5 mL/hr over 240 Minutes Intravenous Every 8 hours 05/11/20 1625     05/11/20 1000  cefTRIAXone (ROCEPHIN) 2 g in sodium chloride 0.9 % 100 mL IVPB  Status:  Discontinued       "And" Linked Group Details   2 g 200 mL/hr over 30 Minutes Intravenous Every 24 hours 05/10/20 1350 05/11/20 1540   05/10/20 1800  metroNIDAZOLE (FLAGYL) IVPB 500 mg  Status:  Discontinued       "And" Linked Group Details   500 mg 100 mL/hr over 60 Minutes Intravenous Every 8 hours 05/10/20 1350 05/11/20 1540   05/10/20 0915  cefTRIAXone (ROCEPHIN) 2 g in sodium chloride 0.9 % 100 mL IVPB       "And" Linked Group Details   2 g 200 mL/hr over 30 Minutes Intravenous  Once 05/10/20 0902 05/10/20 1223   05/10/20 0915  metroNIDAZOLE (FLAGYL) IVPB 500 mg       "And" Linked Group Details   500 mg 100 mL/hr over 60  Minutes Intravenous  Once 05/10/20 0902 05/10/20 1115      Assessment/Plan: s/p Procedure(s): APPENDECTOMY LAPAROSCOPIC (N/A) Advance diet. Start clears today D/c ng Ambulate HTN HLD Grave's disease Primary sclerosing cholangitiss/plap chole (in CTL) - followed byGI,Dr.Armbrusterper PCP's note  Protein S deficiency HxSMV thrombosis on Eliquis ABL anemia - Hgb 12.6, monitor - Per [facility], appreciate their assistance with this patient - Moderate malnutrition - prealbumin 9.9   Chronic ruptured appendicitis with abscess and unrecognizable structures involving  the urachus and pelvis. -s/pLaparoscopic debridement of abscess containing yellow pus and thick fibrinous exudatesby Dr. Hassell Done on 05/11/2020 - POD #6 - persistent ileus - Continue abx - Mobilize(in halls) - IS - Keep K >4 and Mg > 2 for bowel function -Pathw disrupted fibromuscular tissue with marked acute inflammation, consistent with clinically stated appendix with appendicitis   FEN -advance diet VTE -SCDs, IV Heparin, continue to hold eliquis ID -Rocephin/Flagyl7/26 - 7/27. Zosyn 7/27 >>day#6 Foley - None Follow-Up- Dr. Hassell Done  LOS: 7 days    Autumn Messing III 05/17/2020

## 2020-05-18 DIAGNOSIS — K567 Ileus, unspecified: Secondary | ICD-10-CM

## 2020-05-18 DIAGNOSIS — E890 Postprocedural hypothyroidism: Secondary | ICD-10-CM

## 2020-05-18 DIAGNOSIS — K9189 Other postprocedural complications and disorders of digestive system: Secondary | ICD-10-CM

## 2020-05-18 LAB — CBC
HCT: 35.5 % — ABNORMAL LOW (ref 39.0–52.0)
Hemoglobin: 11.9 g/dL — ABNORMAL LOW (ref 13.0–17.0)
MCH: 31.1 pg (ref 26.0–34.0)
MCHC: 33.5 g/dL (ref 30.0–36.0)
MCV: 92.7 fL (ref 80.0–100.0)
Platelets: 159 10*3/uL (ref 150–400)
RBC: 3.83 MIL/uL — ABNORMAL LOW (ref 4.22–5.81)
RDW: 13.7 % (ref 11.5–15.5)
WBC: 7.2 10*3/uL (ref 4.0–10.5)
nRBC: 0 % (ref 0.0–0.2)

## 2020-05-18 LAB — BASIC METABOLIC PANEL
Anion gap: 8 (ref 5–15)
BUN: <5 mg/dL — ABNORMAL LOW (ref 6–20)
CO2: 27 mmol/L (ref 22–32)
Calcium: 8.9 mg/dL (ref 8.9–10.3)
Chloride: 103 mmol/L (ref 98–111)
Creatinine, Ser: 0.74 mg/dL (ref 0.61–1.24)
GFR calc Af Amer: >60 mL/min (ref >60)
GFR calc non Af Amer: >60 mL/min (ref >60)
Glucose, Bld: 127 mg/dL — ABNORMAL HIGH (ref 70–99)
Potassium: 3.8 mmol/L (ref 3.5–5.1)
Sodium: 138 mmol/L (ref 135–145)

## 2020-05-18 LAB — MAGNESIUM: Magnesium: 2.4 mg/dL (ref 1.7–2.4)

## 2020-05-18 LAB — HEPARIN LEVEL (UNFRACTIONATED): Heparin Unfractionated: 0.52 IU/mL (ref 0.30–0.70)

## 2020-05-18 LAB — PHOSPHORUS: Phosphorus: 3.4 mg/dL (ref 2.5–4.6)

## 2020-05-18 MED ORDER — POTASSIUM CHLORIDE 10 MEQ/50ML IV SOLN
10.0000 meq | INTRAVENOUS | Status: AC
  Administered 2020-05-18 (×2): 10 meq via INTRAVENOUS
  Filled 2020-05-18 (×2): qty 50

## 2020-05-18 MED ORDER — APIXABAN 5 MG PO TABS
5.0000 mg | ORAL_TABLET | Freq: Two times a day (BID) | ORAL | Status: DC
  Administered 2020-05-18: 17:00:00 5 mg via ORAL
  Filled 2020-05-18: qty 1

## 2020-05-18 MED ORDER — AMLODIPINE BESYLATE 2.5 MG PO TABS: 2.5000 mg | ORAL_TABLET | Freq: Every day | ORAL | 1 refills | Status: DC

## 2020-05-18 MED ORDER — BISOPROLOL FUMARATE 10 MG PO TABS: 10.0000 mg | ORAL_TABLET | Freq: Every day | ORAL | 0 refills | Status: DC

## 2020-05-18 MED ORDER — AMOXICILLIN-POT CLAVULANATE 875-125 MG PO TABS
1.0000 | ORAL_TABLET | Freq: Two times a day (BID) | ORAL | 0 refills | Status: AC
Start: 2020-05-18 — End: 2020-05-20

## 2020-05-18 MED ORDER — ONDANSETRON 4 MG PO TBDP: 4.0000 mg | ORAL_TABLET | Freq: Four times a day (QID) | ORAL | 0 refills | Status: DC | PRN

## 2020-05-18 MED FILL — BISOPROLOL FUMARATE 10 MG T: 10 | 30 days supply | Qty: 30 | Fill #0

## 2020-05-18 MED FILL — AMLODIPINE 2.5 MG TABLET: 2.5 | 30 days supply | Qty: 30 | Fill #0

## 2020-05-18 MED FILL — ONDANSETRON ODT 4 MG TABLET: 4 | 5 days supply | Qty: 20 | Fill #0

## 2020-05-18 MED FILL — AMOX-CLAV 875-125 MG TABLET: 875-125 | 2 days supply | Qty: 4 | Fill #0

## 2020-05-18 NOTE — Progress Notes (Signed)
[facility] to transition off IV heparin back to Eliquis Indication: protein S deficiency, SMV thrombus   Plan: Stop IV heparin at 17:00, then immediately resume Eliquis 5mg  PO bid.  Pharmacy will sign off.  Peggyann Juba, PharmD, BCPS Pharmacy: [phone] 05/18/2020 2:26 PM

## 2020-05-18 NOTE — Progress Notes (Signed)
PHARMACY - TOTAL PARENTERAL NUTRITION CONSULT NOTE   Indication: Prolonged ileus  Patient Measurements: Height: _0  (190.5 cm) Weight: (!) 110.3 kg (243 lb 3.2 oz) IBW/kg (Calculated) : 84.5 TPN AdjBW (KG): 91 Body mass index is 30.4 kg/m. 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 Taken to OR for acute appendicitis and found to have chronic ruptured appendicitis with abscess and significant distortions of RLQ anatomy including adhesions with lower abdominal wall. Some flatus but no BM on POD5; Dx with persistent ileus and pharmacy consulted to dose TPN  CBGs (goal 100-150): CBG WNL, range 79-124 - no Hx DM, SSI discontinued by MD Electrolytes: K (3.8) WNL but slightly below goal. All other lytes WNL including CorrCa (9.5).  Goal K >4, Mg > 2 Renal: SCr, BUN - stable, WNL; UOP unmeasured Hepatic: LFTs WNL, Alk Phos slightly low, Tbili WNL Prealbumin / TG: Prealbumin low (10.2); TG WNL (70) I/O: NGT removed; MIVF discontinued by MD GI Imaging: - 7/29: persistent dilation of SB loops - 7/31: worsening ileus Surgeries / Procedures:  - 7/27: laparoscopic debridement of abscess and appendectomy  Central access: Double lumen PICC placed 8/1 TPN start date: 8/2  TPN initiation planned for 8/1 was delayed due to TPN tubing issue. Initially received Dextrose 10% infusion on 8/1 instead of custom TPN.  Nutritional Goals: Per RD recs (8/2) Kcal: 2250-2450; Protein: 100-110 g; Fluid: 2.2 L/day  TPN at goal rate of 90 mL/hr would provide 2330 kcal, 367 g dextrose, and 108 g protein, meeting 100% of patient needs.   Current Nutrition:  -Diet advanced to full liquid  -TPN -Boost Breeze TID  Plan: Now:  KCl 10 mEq IV x 2 to target K  >4   Order received from hospitalist to wean patient off of TPN today as he is tolerating advancing diet.   At 1800:  Discontinue TPN. TPN running at reduced rate until 1800 this evening   Discontinue CBG checks and associated TPN labs  Pharmacy to sign off. Please re-consult if needed.  Lenis Noon, PharmD 05/18/20 8:43 AM

## 2020-05-18 NOTE — Progress Notes (Signed)
[place] for IV heparin Indication: protein S deficiency, SMV thrombus (bridging while off Eliquis)  Allergies  Allergen Reactions  . Tylenol [Acetaminophen] Other (See Comments)    r/t elevated LFTs     Patient Measurements: Height: 6\' 3"  (190.5 cm) Weight: (!) 110.3 kg (243 lb 3.2 oz) IBW/kg (Calculated) : 84.5 Heparin Dosing Weight: 107 kg  Vital Signs: Temp: 98.1 F (36.7 C) (08/03 0540) Temp Source: Oral (08/03 0540) BP: 147/71 (08/03 0540) Pulse Rate: 79 (08/03 0540)  Labs: Recent Labs    05/16/20 0556 05/16/20 0556 05/17/20 0305 05/18/20 0410  HGB 12.4*   < > 11.5* 11.9*  HCT 37.8*  --  34.9* 35.5*  PLT 137*  --  129* 159  HEPARINUNFRC 0.59  --  0.57 0.52  CREATININE 0.80  --  0.88 0.74   < > = values in this interval not displayed.    Estimated Creatinine Clearance: 186 mL/min (by C-G formula based on SCr of 0.74 mg/dL).  Assessment: 26 yoM with PMH of SMV thrombus and protein S deficiency on Eliquis PTA, admitted 7/26 for abdominal pain. Found to have acute appendicitis and Eliquis held to allow for appendectomy. Pharmacy to bridge with heparin perioperatively.    Baseline heparin level slightly elevated at 0.46 as anticipated due to lab-drug interaction with apixaban, aPTT WNL at 23  Prior anticoagulation: Eliquis 5 mg PO bid, last dose 7/25 AM PTA  Significant events:    7/27 s/pLaparoscopic debridement of abscess containing yellow pus and thick fibrinous exudatesby Dr. Hassell Done - heparin resumed 6 hrs after surgery w/ no bolus  7/29 AM: vomited medium amt of bright red blood in toilet. Heparin IV stopped at 0400; resumed at 1330 at lower rate  7/30: heparin held again for blood in NGT drainage - per [facility], defer resumption to Surgery  7/31: resume heparin at 1400 at previous rate with no bolus per Dr. Rosendo Gros with Surgery  Today, 05/18/2020:  HL = 0.52 remains therapeutic on heparin infusion of 1900  units/hr  CBC: Hgb slightly low but stable; Plt increased to WNL  SCr stable WNL  Confirmed with RN that heparin infusing at correct rate; no signs/symptoms of bleeding. No issues with infusion.  Goal of Therapy: Heparin level 0.3-0.7 units/ml Monitor platelets by anticoagulation protocol: Yes  Plan:  Continue heparin infusion at current rate of 1900 units/hr   Daily CBC and heparin level  Monitor for signs of bleeding or thrombosis  F/u resolution of ileus and ability to resume PTA Eliquis  Lenis Noon, PharmD 05/18/20 8:32 AM

## 2020-05-18 NOTE — Progress Notes (Signed)
[facility] Progress Note  7 Days Post-Op  Subjective: CC-  Up walking around room. Abdomen a little sore but states that he is feeling better. Tolerating full liquids. Denies bloating, nausea, or vomiting. Continues to pass flatus. He had another loose BM this morning. WBC 7.2, afebrile Asking when he can go home.  Objective: Vital signs in last 24 hours: Temp:  [98.1 F (36.7 C)-98.3 F (36.8 C)] 98.1 F (36.7 C) (08/03 0540) Pulse Rate:  [79-109] 79 (08/03 0540) Resp:  [16] 16 (08/03 0540) BP: (144-163)/(71-86) 147/71 (08/03 0540) SpO2:  [99 %-100 %] 99 % (08/03 0540) Last BM Date: 05/17/20  Intake/Output from previous day: 08/02 0701 - 08/03 0700 In: 1978.5 [P.O.:480; I.V.:1051.6; IV Piggyback:446.9] Out: 1525 [Urine:1525] Intake/Output this shift: No intake/output data recorded.  PE: Gen:  Alert, NAD, pleasant Pulm:  rate and effort normal Abd: Soft, minimal distension, +BS, nontender, lap incisions cdi Skin: no rashes noted, warm and dry  Lab Results:  Recent Labs    05/17/20 0305 05/18/20 0410  WBC 6.6 7.2  HGB 11.5* 11.9*  HCT 34.9* 35.5*  PLT 129* 159   BMET Recent Labs    05/17/20 0305 05/18/20 0410  NA 136 138  K 3.3* 3.8  CL 102 103  CO2 20* 27  GLUCOSE 93 127*  BUN 5* <5*  CREATININE 0.88 0.74  CALCIUM 8.4* 8.9   PT/INR No results for input(s): LABPROT, INR in the last 72 hours. CMP     Component Value Date/Time   NA 138 05/18/2020 0410   K 3.8 05/18/2020 0410   CL 103 05/18/2020 0410   CO2 27 05/18/2020 0410   GLUCOSE 127 (H) 05/18/2020 0410   BUN <5 (L) 05/18/2020 0410   CREATININE 0.74 05/18/2020 0410   CREATININE 1.14 05/05/2020 1614   CALCIUM 8.9 05/18/2020 0410   PROT 6.5 05/17/2020 0305   ALBUMIN 3.3 (L) 05/17/2020 0305   AST 16 05/17/2020 0305   AST 303 (HH) 08/16/2018 1150   ALT 21 05/17/2020 0305   ALT 543 (HH) 08/16/2018 1150   ALKPHOS 37 (L) 05/17/2020 0305   BILITOT 0.9 05/17/2020 0305   BILITOT  10.3 (HH) 08/16/2018 1150   GFRNONAA >60 05/18/2020 0410   GFRNONAA 88 05/05/2020 1614   GFRAA >60 05/18/2020 0410   GFRAA 102 05/05/2020 1614   Lipase     Component Value Date/Time   LIPASE 26 05/10/2020 0149       Studies/Results: No results found.  Anti-infectives: Anti-infectives (From admission, onward)   Start     Dose/Rate Route Frequency Ordered Stop   05/11/20 1800  piperacillin-tazobactam (ZOSYN) IVPB 3.375 g     Discontinue     3.375 g 12.5 mL/hr over 240 Minutes Intravenous Every 8 hours 05/11/20 1625     05/11/20 1000  cefTRIAXone (ROCEPHIN) 2 g in sodium chloride 0.9 % 100 mL IVPB  Status:  Discontinued       "And" Linked Group Details   2 g 200 mL/hr over 30 Minutes Intravenous Every 24 hours 05/10/20 1350 05/11/20 1540   05/10/20 1800  metroNIDAZOLE (FLAGYL) IVPB 500 mg  Status:  Discontinued       "And" Linked Group Details   500 mg 100 mL/hr over 60 Minutes Intravenous Every 8 hours 05/10/20 1350 05/11/20 1540   05/10/20 0915  cefTRIAXone (ROCEPHIN) 2 g in sodium chloride 0.9 % 100 mL IVPB       "And" Linked Group Details   2 g 200 mL/hr  over 30 Minutes Intravenous  Once 05/10/20 0902 05/10/20 1223   05/10/20 0915  metroNIDAZOLE (FLAGYL) IVPB 500 mg       "And" Linked Group Details   500 mg 100 mL/hr over 60 Minutes Intravenous  Once 05/10/20 0902 05/10/20 1115       Assessment/Plan HTN HLD Grave's disease Primary sclerosing cholangitiss/plap chole (in CTL) - followed byGI,Dr.Armbrusterper PCP's note  Protein S deficiency HxSMV thrombosis on Eliquis ABL anemia - Hgb 11.9, stable - Per [facility], appreciate their assistance with this patient - Moderate malnutrition - prealbumin 9.9 >> 10.2 (8/2)  Chronic ruptured appendicitis with abscess and unrecognizable structures involving the urachus and pelvis. -s/pLaparoscopic debridement of abscess containing yellow pus and thick fibrinous exudatesby Dr. Hassell Done on 05/11/2020 - POD #7 -Pathw  disrupted fibromuscular tissue with marked acute inflammation, consistent with clinically stated appendix with appendicitis - ileus resolving  FEN -wean TPN, soft diet VTE -SCDs, IV Heparin, ok to restart eliquis from surgical standpoint ID -Rocephin/Flagyl7/26 - 7/27. Zosyn 7/27 >>day#8 Foley - None Follow-Up- Dr. Hassell Done  Plan: Advance to soft diet and wean TPN. Ok to restart eliquis. Patient is really wanting to go home today. I think he could finish his TPN and IV zosyn today and go home later today.  Recommend 2 days of augmentin to complete 10 total days of antibiotics. I will place follow up in our office on AVS.   LOS: 8 days    Wellington Hampshire, [facility] Surgery 05/18/2020, 11:40 AM Please see Amion for pager number during day hours 7:00am-4:30pm

## 2020-05-18 NOTE — Discharge Summary (Signed)
Physician Discharge Summary  Kipp Shank TDD:[id number] DOB: 04/25/1991 DOA: 05/10/2020  PCP: [name], MD  Admit date: 05/10/2020 Discharge date: 05/18/2020 Consultations: General surgery Admitted From: home Disposition: home  Discharge Diagnoses:  Principal Problem:   Appendicitis Active Problems:   Hypothyroidism   Hyperlipidemia, mixed   Abnormal LFTs   Hypertension   DVT (deep venous thrombosis) (HCC)   Protein S deficiency [facility])   Hospital Course Summary: 29 year old male with past medical history of primary sclerosing cholangitisfollowswith Dr. 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 CT abdomen pelvis at [facility] acute nonperforated appendicitis. He was given ceftriaxone and Flagyl transferred to [facility] after ED physician had discussion with Dr. Hassell Done, general surgery on-call.Eliquis was held and patient was admitted for further management.  Patient underwent laparoscopic surgery with findings of chronic ruptured appendicitis with abscess/unrecognizable structures involving the urachus and pelvis.  He underwent appendectomy and debridement of abscess containing yellow pus and thick fibrinous exudates by Dr. Hassell Done on 7/27.  Postoperative course complicated by ileus requiring NG tube placement.  Chronic ruptured appendicitis with abscess: Patient underwent laparoscopic debridement of the abscess containing yellow pus and thick fibrinous exudates by GS on 7/27.  Has been n.p.o. with NGT, IV fluids and IV antibiotics.  Was taken Off NG tube on 8/2 and started on clear liquid diet by general surgery.  He had bowel movements yesterday and feels  good to go home today.  Seen by general surgery for follow-up and recommended to try soft diet this afternoon and okay to discharge later today if doing well.  Discontinued IV fluids.  Patient ambulating in the room independently.  No further nausea/vomiting.  Will transition IV antibiotics to oral Augmentin for another 2 days to complete 10-day course per general surgery recommendations.  Postoperative ileus: Improving with several bowel movements in the last 24 hours.  Appreciate general surgery assistance and follow-up.  Plan to discharge home today as discussed above.  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  He has been hypokalemic here since presentation.  He was managed with  IV Lopressor while he was n.p.o. for above problems.  Blood pressure remained elevated and uncontrolled over the last few days-transitioned back to bisoprolol and added low-dose Norvasc yesterday with which is blood pressure appears to be in the normal range today.  Will hold off on HCTZ for now given hypokalemia and AKI on presentation.  Follow-up PCP.   Mild acute kidney injury: .Creatinine 1.29 on admission.  Creatinine down to 0.8 with IV fluids.  Now off NG tube, tolerating diet and can DC fluids.  Hold off HCTZ for now as discussed above.  Hypokalemia-improved with replacement therapy. Hold off HCTZ for now as discussed above.  History of Graves' disease status post RAI ablation now with acquired hypothyroidism-TSH 9.45, on 7/21.  Not sure if patient Synthroid dose was increased recently.  Resume at 100 mcg upon discharge and follow-up PCP.  Hyperlipidemia-resume home medications.  Thrombocytopenia-in the setting of acute infection. Appears to be uptrending now  History of primary sclerosing cholangitis-Chronic changes noted on CT scan. Patient  [facility].  Obesity-Estimated body mass  index is 30.4 kg/m as calculated from the following:Height as of this encounter: 6\' 3"  (1.905 m).Weight as of this encounter: 110.3 kg.   Discharge Exam:   Vitals:   05/17/20 2133 05/18/20 0539 05/18/20 0540 05/18/20 1348  BP: (!) 144/79 (!) 147/71 (!) 147/71 132/78  Pulse: 88 81 79 87  Resp: 16 16 16 17   Temp: 98.3 F (36.8 C)  98.1 F (36.7 C) 98.4 F (36.9 C)  TempSrc: Oral  Oral Oral  SpO2: 100% 99% 99% 95%  Weight:      Height:        General: Pt is alert, awake, not in acute distress Cardiovascular: RRR, S1/S2 +, no rubs, no gallops Respiratory: CTA bilaterally, no wheezing, no rhonchi Abdominal: Soft, NT, ND, bowel sounds + Extremities: no edema, no cyanosis  Discharge Condition:Stable CODE STATUS: Full code Diet recommendation: Heart healthy Recommendations for Outpatient Follow-up:  1. Follow up with PCP: 1 week 2. Follow up with consultants: General surgery as scheduled 3. Please obtain follow up labs including: [facility] services upon discharge: None Equipment/Devices upon discharge: None   Discharge Instructions:  Discharge Instructions    Call MD for:  extreme fatigue   Complete by: As directed    Call MD for:  persistant dizziness or light-headedness   Complete by: As directed    Call MD for:  persistant nausea and vomiting   Complete by: As directed    Call MD for:  severe uncontrolled pain   Complete by: As directed    Call MD for:  temperature >100.4   Complete by: As directed    Diet - low sodium heart healthy   Complete by: As directed    Increase activity slowly   Complete by: As directed      Allergies as of 05/18/2020      Reactions   Tylenol [acetaminophen] Other (See Comments)   r/t elevated LFTs      Medication List    STOP taking these medications   bisoprolol-hydrochlorothiazide 10-6.25 MG tablet Commonly known as: ZIAC     TAKE these medications    alum & mag hydroxide-simeth [phone] MG/5ML suspension Commonly known as: Maalox Advanced Max St Take 15 mLs by mouth every 6 (six) hours as needed for indigestion.   amLODipine 2.5 MG tablet Commonly known as: NORVASC Take 1 tablet (2.5 mg total) by mouth daily. Start taking on: May 19, 2020   amoxicillin-clavulanate 875-125 MG tablet Commonly known as: Augmentin Take 1 tablet by mouth 2 (two) times daily for 2 days.   apixaban 5 MG Tabs tablet Commonly known as: Eliquis TAKE 1 TABLET(5 MG) BY MOUTH TWICE DAILY What changed:   how much to take  how to take this  when to take this  additional instructions   bisoprolol 10 MG tablet Commonly known as: ZEBETA Take 1 tablet (10 mg total) by mouth daily. Start taking on: May 19, 2020   busPIRone 10 MG tablet Commonly known as: BUSPAR Take 1 tablet 3 x /day for Anxiety What changed:   how much to take  how to take this  when to take this  additional instructions   escitalopram 20 MG tablet Commonly known as: LEXAPRO Take 1 tablet Daily for Mood, Anxiety, Irritability  & Depression What changed:   how much to take  how to take this  when to take this  additional instructions   ezetimibe 10 MG tablet Commonly known as: ZETIA Take 1 tablet Daily for  Cholesterol What changed:   how much to take  how to take this  when to take this  additional instructions   levothyroxine 100 MCG tablet Commonly known as: Synthroid Take 1 tablet   Daily   on an empty stomach with only water for 30 minutes & no Antacid meds, Calcium or Magnesium for 4 hours & avoid Biotin What changed: additional instructions   omeprazole 40 MG capsule Commonly known as: PRILOSEC TAKE ONE CAPSULE BY MOUTH EVERY MORNING BEFORE BREAKFAST What changed:   how much to take  how to take this  when to take this  additional instructions   ondansetron 4 MG disintegrating tablet Commonly known as: ZOFRAN-ODT Take 1 tablet (4  mg total) by mouth every 6 (six) hours as needed for nausea.   ondansetron 8 MG tablet Commonly known as: ZOFRAN Take 1 tablet 3 x /day . . . ONLY . Marland Kitchen .  if needed for Nausea What changed:   how much to take  how to take this  when to take this  additional instructions   Vitamin D3 125 MCG (5000 UT) Caps Take 1 capsule (5,000 Units total) by mouth every other day. What changed: when to take this       [facility] Go on 06/01/2020.   Specialty: General Surgery Why: Your appointment is 8/17 at 10am Please arrive 30 minutes prior to your appointment to check in and fill out paperwork. Bring photo ID and insurance information. Contact information: [place] [phone]             Allergies  Allergen Reactions   Tylenol [Acetaminophen] Other (See Comments)    r/t elevated LFTs       The results of significant diagnostics from this hospitalization (including imaging, microbiology, ancillary and laboratory) are listed below for reference.    Labs: BNP (last 3 results) No results for input(s): BNP in the last 8760 hours. Basic Metabolic Panel: Recent Labs  Lab 05/13/20 0422 05/13/20 0422 05/14/20 0359 05/15/20 0532 05/16/20 0556 05/17/20 0305 05/18/20 0410  NA 138   < > 134* 137 136 136 138  K 3.0*   < > 3.0* 2.9* 3.2* 3.3* 3.8  CL 100   < > 100 102 106 102 103  CO2 23   < > 26 24 22  20* 27  GLUCOSE 124*   < > 114* 117* 111* 93 127*  BUN 10   < > 8 6 7  5* <5*  CREATININE 0.94   < > 0.88 0.86 0.80 0.88 0.74  CALCIUM 9.1   < > 8.3* 9.1 8.6* 8.4* 8.9  MG 2.1  --   --  2.0 2.0 2.0 2.4  PHOS  --   --   --   --  3.0 3.6 3.4   < > = values in this interval not displayed.   Liver Function Tests: Recent Labs  Lab 05/17/20 0305  AST 16  ALT 21  ALKPHOS 37*  BILITOT 0.9  PROT 6.5  ALBUMIN 3.3*   No results for input(s): LIPASE, AMYLASE in the last 168  hours. No results for input(s): AMMONIA in the last 168 hours. CBC: Recent Labs  Lab 05/14/20 0359 05/15/20 0532 05/16/20 0556 05/17/20 0305 05/18/20 0410  WBC 6.0 5.9 7.3 6.6 7.2  NEUTROABS  --   --   --  4.3  --   HGB 12.4* 12.6* 12.4* 11.5*  11.9*  HCT 36.9* 38.4* 37.8* 34.9* 35.5*  MCV 90.9 92.5 92.2 93.1 92.7  PLT 107* 128* 137* 129* 159   Cardiac Enzymes: No results for input(s): CKTOTAL, CKMB, CKMBINDEX, TROPONINI in the last 168 hours. BNP: Invalid input(s): POCBNP CBG: Recent Labs  Lab 05/17/20 1750  GLUCAP 79   D-Dimer No results for input(s): DDIMER in the last 72 hours. Hgb A1c No results for input(s): HGBA1C in the last 72 hours. Lipid Profile Recent Labs    05/17/20 0305  TRIG 70   Thyroid function studies No results for input(s): TSH, T4TOTAL, T3FREE, THYROIDAB in the last 72 hours.  Invalid input(s): FREET3 Anemia work up No results for input(s): VITAMINB12, FOLATE, FERRITIN, TIBC, IRON, RETICCTPCT in the last 72 hours. Urinalysis    Component Value Date/Time   COLORURINE YELLOW 05/10/2020 0901   APPEARANCEUR CLEAR 05/10/2020 0901   LABSPEC 1.010 05/10/2020 0901   PHURINE 8.0 05/10/2020 0901   GLUCOSEU NEGATIVE 05/10/2020 0901   HGBUR MODERATE (A) 05/10/2020 0901   BILIRUBINUR NEGATIVE 05/10/2020 0901   KETONESUR 40 (A) 05/10/2020 0901   PROTEINUR NEGATIVE 05/10/2020 0901   NITRITE NEGATIVE 05/10/2020 0901   LEUKOCYTESUR NEGATIVE 05/10/2020 0901   Sepsis Labs Invalid input(s): PROCALCITONIN,  WBC,  LACTICIDVEN Microbiology Recent Results (from the past 240 hour(s))  SARS Coronavirus 2 by RT PCR (hospital order, performed in [place] hospital lab) Nasopharyngeal Nasopharyngeal Swab     Status: None   Collection Time: 05/10/20  9:31 AM   Specimen: Nasopharyngeal Swab  Result Value Ref Range Status   SARS Coronavirus 2 NEGATIVE NEGATIVE Final    Comment: (NOTE) SARS-CoV-2 target nucleic acids are NOT DETECTED.  The SARS-CoV-2 RNA is  generally detectable in upper and lower respiratory specimens during the acute phase of infection. The lowest concentration of SARS-CoV-2 viral copies this assay can detect is 250 copies / mL. A negative result does not preclude SARS-CoV-2 infection and should not be used as the sole basis for treatment or other patient management decisions.  A negative result may occur with improper specimen collection / handling, submission of specimen other than nasopharyngeal swab, presence of viral mutation(s) within the areas targeted by this assay, and inadequate number of viral copies (<250 copies / mL). A negative result must be combined with clinical observations, patient history, and epidemiological information.  Fact Sheet for Patients:   StrictlyIdeas.no  Fact Sheet for Healthcare Providers: BankingDealers.co.za  This test is not yet approved or  cleared by the [place] FDA and has been authorized for detection and/or diagnosis of SARS-CoV-2 by FDA under an Emergency Use Authorization (EUA).  This EUA will remain in effect (meaning this test can be used) for the duration of the COVID-19 declaration under Section 564(b)(1) of the Act, 21 U.S.C. section 360bbb-3(b)(1), unless the authorization is terminated or revoked sooner.  Performed at [facility], [address], [place], [place] [address]   Surgical pcr screen     Status: None   Collection Time: 05/11/20  1:22 AM   Specimen: Nasal Mucosa; Nasal Swab  Result Value Ref Range Status   MRSA, PCR NEGATIVE NEGATIVE Final   Staphylococcus aureus NEGATIVE NEGATIVE Final    Comment: (NOTE) The Xpert SA Assay (FDA approved for NASAL specimens in patients 27 years of age and older), is one component of a comprehensive surveillance program. It is not intended to diagnose infection nor to guide or monitor treatment. Performed at [facility], [address] [place], [place] [address]     Procedures/Studies: CT Abdomen Pelvis W Contrast  Result Date: 05/10/2020 CLINICAL DATA:  29 year old male with history of acute onset of left lower quadrant tenderness and left-sided flank pain. Leukocytosis. History of primary sclerosing cholangitis. EXAM: CT ABDOMEN AND PELVIS WITH CONTRAST TECHNIQUE: Multidetector CT imaging of the abdomen and pelvis was performed using the standard protocol following bolus administration of intravenous contrast. CONTRAST:  177mL OMNIPAQUE IOHEXOL 300 MG/ML  SOLN COMPARISON:  CT the abdomen and pelvis 07/05/2018. FINDINGS: Lower chest: Unremarkable. Hepatobiliary: Liver has a irregular contour, and there is moderate intrahepatic biliary ductal dilatation, increased slightly compared to prior studies, compatible with reported clinical history of primary sclerosing cholangitis. In segment 7 of the liver there is a 2.6 x 2.0 cm intermediate attenuation lesion (axial image 22 of series 2). In segment 5 of the liver there is a 1.5 x 1.4 cm intermediate attenuation lesion (axial image 30 of series 2). Both of these lesions were previously characterized as cavernous hemangiomas on prior abdominal MRI 08/16/2018. No other suspicious hepatic lesions. Status post cholecystectomy. Cavernous transformation in the porta hepatis. Common bile duct is normal in caliber. Pancreas: No pancreatic mass. No pancreatic ductal dilatation. No pancreatic or peripancreatic fluid collections or inflammatory changes. Spleen: Unremarkable. Adrenals/Urinary Tract: Bilateral kidneys and adrenal glands are normal in appearance. No hydroureteronephrosis. Urinary bladder is normal in appearance. Stomach/Bowel: Normal appearance of the stomach. No pathologic dilatation of small bowel or colon. The appendix is dilated and inflamed, concerning for an acute appendicitis. Appendix: Location: Low anatomic pelvis Diameter: 2.0 cm Appendicolith: None Mucosal hyper-enhancement:  Present Extraluminal gas: None Periappendiceal collection: None Vascular/Lymphatic: No significant atherosclerotic disease, aneurysm or dissection noted in the abdominal or pelvic vasculature. 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 No lymphadenopathy noted in the abdomen or pelvis. Reproductive: Prostate gland and seminal vesicles are unremarkable in appearance. Other: No significant volume of ascites.  No pneumoperitoneum. Musculoskeletal: There are no aggressive appearing lytic or blastic lesions noted in the visualized portions of the skeleton. IMPRESSION: 1. Findings are compatible with acute appendicitis. No periappendiceal abscess or signs of frank perforation are noted at this time. Surgical consultation is recommended. 2. Chronic changes related to primary sclerosing cholangitis redemonstrated with increased intrahepatic biliary ductal dilatation, as above. 3. 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 4. 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 Electronically Signed   By: Vinnie Langton M.D.   On: 05/10/2020 08:33   DG Abd Portable 1V  Result Date: 05/15/2020 CLINICAL DATA:  Follow-up likely postoperative ileus after laparoscopic appendectomy (postop day 4). EXAM: PORTABLE ABDOMEN - 1 VIEW COMPARISON:  05/14/2020 and earlier. FINDINGS: Nasogastric tube tip projects over the gastric fundus. Numerous dilated loops of small bowel in the upper and mid abdomen have increased in caliber since the examinations yesterday and 05/13/2020. Gas is present throughout the decompressed colon. No suggestion of free air on the supine image. IMPRESSION: 1. Worsening postoperative ileus versus small-bowel obstruction with increase in caliber of the dilated small bowel loops in the upper abdomen. 2. Nasogastric tube tip projects over the gastric fundus. 3. No suggestion  of free air on the supine image. Electronically Signed   By: Evangeline Dakin M.D.   On: 05/15/2020 10:02   DG Abd Portable 1V  Result Date: 05/14/2020 CLINICAL DATA:  Ileus EXAM: PORTABLE ABDOMEN - 1 VIEW COMPARISON:  05/13/2020 FINDINGS: Difficult on this single image centered low to determine exact position of the NG tube. Continued gaseous distention of bowel, with slight decreased small bowel distention since prior study. No organomegaly or suspicious calcification. IMPRESSION: Slight decreased gaseous distention of small bowel since prior study. Electronically Signed   By: Rolm Baptise M.D.   On: 05/14/2020 10:20   DG Abd Portable 1V  Result Date: 05/13/2020 CLINICAL DATA:  NG tube placement EXAM: PORTABLE ABDOMEN - 1 VIEW COMPARISON:  05/13/2020 FINDINGS: NG tube coils in the stomach. IMPRESSION: NG tube in the stomach. Electronically Signed   By: Rolm Baptise M.D.   On: 05/13/2020 09:52   DG Abd Portable 1V  Result Date: 05/13/2020 CLINICAL DATA:  Ileus EXAM: PORTABLE ABDOMEN - 1 VIEW COMPARISON:  May 12, 2020 FINDINGS: Nasogastric tube no longer appreciable. There remain multiple loops of dilated bowel without appreciable air-fluid level. No free air. Lung bases clear. IMPRESSION: Persistent dilatation of multiple loops of small bowel. Nasogastric tube no longer evident. Suspect ileus, although a degree of bowel obstruction cannot be excluded on this study. No free air evident. Lung bases clear. Electronically Signed   By: Lowella Grip III M.D.   On: 05/13/2020 07:11   DG Abd Portable 1V  Result Date: 05/12/2020 CLINICAL DATA:  Encounter for imaging study to confirm nasogastric (NG) tube placement EXAM: PORTABLE ABDOMEN - 1 VIEW COMPARISON:  04/28/2018 FINDINGS: Nasogastric tube passes well below the diaphragm, curling within the proximal to mid stomach. There is increased bowel gas with mild small bowel dilation. Small-bowel dilation is similar to the prior study. IMPRESSION:  Well-positioned nasogastric tube. Electronically Signed   By: Lajean Manes M.D.   On: 05/12/2020 11:37   [place] EKG SITE RITE  Result Date: 05/16/2020 If Site Rite image not attached, placement could not be confirmed due to current cardiac rhythm.    Time coordinating discharge: Over 30 minutes  SIGNED:   Guilford Shi, MD  [facility] 05/18/2020, 2:29 PM

## 2020-05-19 ENCOUNTER — Other Ambulatory Visit: Payer: Self-pay

## 2020-05-19 MED ORDER — EZETIMIBE 10 MG PO TABS: ORAL_TABLET | ORAL | 0 refills | Status: DC

## 2020-05-24 LAB — GLUCOSE, CAPILLARY
Glucose-Capillary: 117 mg/dL — ABNORMAL HIGH (ref 70–99)
Glucose-Capillary: 122 mg/dL — ABNORMAL HIGH (ref 70–99)
Glucose-Capillary: 123 mg/dL — ABNORMAL HIGH (ref 70–99)
Glucose-Capillary: 124 mg/dL — ABNORMAL HIGH (ref 70–99)
Glucose-Capillary: 95 mg/dL (ref 70–99)
Glucose-Capillary: 96 mg/dL (ref 70–99)

## 2020-05-28 MED FILL — ONDANSETRON ODT 4 MG TABLET: 4 | 5 days supply | Qty: 20 | Fill #0

## 2020-05-28 MED FILL — LEVOTHYROXINE SODIUM 100 MC: 100 | 90 days supply | Qty: 90 | Fill #0

## 2020-06-08 ENCOUNTER — Other Ambulatory Visit: Payer: Self-pay | Admitting: Student

## 2020-06-08 ENCOUNTER — Other Ambulatory Visit (HOSPITAL_COMMUNITY): Payer: Self-pay | Admitting: Student

## 2020-06-08 DIAGNOSIS — R109 Unspecified abdominal pain: Secondary | ICD-10-CM

## 2020-06-15 ENCOUNTER — Encounter: Payer: Self-pay | Admitting: Internal Medicine

## 2020-06-15 ENCOUNTER — Telehealth: Payer: Self-pay | Admitting: Internal Medicine

## 2020-06-15 DIAGNOSIS — K55069 Acute infarction of intestine, part and extent unspecified: Secondary | ICD-10-CM | POA: Insufficient documentation

## 2020-06-15 NOTE — Telephone Encounter (Signed)
notified mother [id number] application was completed and faxed to [facility]. Mailed originals to patient home address.

## 2020-06-16 ENCOUNTER — Encounter (HOSPITAL_COMMUNITY): Payer: Self-pay

## 2020-06-16 ENCOUNTER — Other Ambulatory Visit: Payer: Self-pay

## 2020-06-16 ENCOUNTER — Ambulatory Visit (HOSPITAL_COMMUNITY)
Admission: RE | Admit: 2020-06-16 | Discharge: 2020-06-16 | Disposition: A | Discharge status: Home / Self Care | Payer: Self-pay | Source: Ambulatory Visit | Attending: Student | Admitting: Student

## 2020-06-16 DIAGNOSIS — R109 Unspecified abdominal pain: Secondary | ICD-10-CM | POA: Insufficient documentation

## 2020-06-16 MED ORDER — IOHEXOL 300 MG/ML  SOLN
100.0000 mL | Freq: Once | INTRAMUSCULAR | Status: AC | PRN
  Administered 2020-06-16: 100 mL via INTRAVENOUS

## 2020-06-24 ENCOUNTER — Other Ambulatory Visit: Payer: Self-pay

## 2020-06-24 DIAGNOSIS — R112 Nausea with vomiting, unspecified: Secondary | ICD-10-CM

## 2020-06-24 MED ORDER — ONDANSETRON HCL 8 MG PO TABS: ORAL_TABLET | ORAL | 0 refills | Status: DC

## 2020-07-18 ENCOUNTER — Other Ambulatory Visit: Payer: Self-pay

## 2020-07-18 DIAGNOSIS — F332 Major depressive disorder, recurrent severe without psychotic features: Secondary | ICD-10-CM

## 2020-07-19 MED ORDER — ESCITALOPRAM OXALATE 20 MG PO TABS: ORAL_TABLET | ORAL | 0 refills | Status: DC

## 2020-07-22 ENCOUNTER — Encounter: Payer: Self-pay | Admitting: Gastroenterology

## 2020-08-23 ENCOUNTER — Other Ambulatory Visit: Payer: Self-pay | Admitting: Gastroenterology

## 2020-08-23 ENCOUNTER — Other Ambulatory Visit: Payer: Self-pay | Admitting: Internal Medicine

## 2020-08-23 DIAGNOSIS — F332 Major depressive disorder, recurrent severe without psychotic features: Secondary | ICD-10-CM

## 2020-08-23 DIAGNOSIS — K219 Gastro-esophageal reflux disease without esophagitis: Secondary | ICD-10-CM

## 2020-10-04 ENCOUNTER — Encounter (HOSPITAL_COMMUNITY): Payer: Self-pay | Admitting: Psychiatry

## 2020-10-04 ENCOUNTER — Other Ambulatory Visit: Payer: Self-pay

## 2020-10-04 ENCOUNTER — Ambulatory Visit (INDEPENDENT_AMBULATORY_CARE_PROVIDER_SITE_OTHER): Payer: No Payment, Other | Admitting: Psychiatry

## 2020-10-04 ENCOUNTER — Other Ambulatory Visit (HOSPITAL_COMMUNITY): Payer: Self-pay | Admitting: Psychiatry

## 2020-10-04 VITALS — BP 165/93 | HR 82 | Ht 75.0 in | Wt 245.0 lb

## 2020-10-04 DIAGNOSIS — F411 Generalized anxiety disorder: Secondary | ICD-10-CM | POA: Insufficient documentation

## 2020-10-04 DIAGNOSIS — F333 Major depressive disorder, recurrent, severe with psychotic symptoms: Secondary | ICD-10-CM | POA: Insufficient documentation

## 2020-10-04 MED ORDER — TRAZODONE HCL 100 MG PO TABS: 100.0000 mg | ORAL_TABLET | Freq: Every day | ORAL | 1 refills | Status: DC

## 2020-10-04 MED ORDER — SERTRALINE HCL 100 MG PO TABS: 100.0000 mg | ORAL_TABLET | Freq: Every day | ORAL | 1 refills | Status: DC

## 2020-10-04 MED ORDER — ARIPIPRAZOLE 5 MG PO TABS: 5.0000 mg | ORAL_TABLET | Freq: Every day | ORAL | 1 refills | Status: DC

## 2020-10-04 MED ORDER — HYDROXYZINE HCL 25 MG PO TABS: 25.0000 mg | ORAL_TABLET | Freq: Three times a day (TID) | ORAL | 1 refills | Status: DC | PRN

## 2020-10-04 MED FILL — hydrOXYzine HCL 25 MG TABS: 25 | 30 days supply | Qty: 90 | Fill #0

## 2020-10-04 MED FILL — SERTRALINE HCL 100 MG TAB: 100 | 30 days supply | Qty: 30 | Fill #0

## 2020-10-04 MED FILL — TRAZODONE HCL 100 MG TABS: 100 | 30 days supply | Qty: 30 | Fill #0

## 2020-10-04 MED FILL — ARIPiprazole 5 MG TABS: 5 | 30 days supply | Qty: 30 | Fill #0

## 2020-10-04 NOTE — Progress Notes (Signed)
Psychiatric Initial Adult Assessment   Patient Identification: Kyle Gonzales MRN:  [id number] Date of Evaluation:  10/04/2020   Referral Source: Walk in  Chief Complaint:  " I am going crazy in my mind."   Visit Diagnosis:    ICD-10-CM   1. Severe episode of recurrent major depressive disorder, with psychotic features (HCC)  F33.3 sertraline (ZOLOFT) 100 MG tablet    traZODone (DESYREL) 100 MG tablet    ARIPiprazole (ABILIFY) 5 MG tablet  2. Generalized anxiety disorder  F41.1 sertraline (ZOLOFT) 100 MG tablet    hydrOXYzine (ATARAX/VISTARIL) 25 MG tablet    History of Present Illness: This is a 29 year old male with history of depression anxiety now seen for evaluation after presenting as a walk-in with his mother.  Patient has history of prior psychiatry care and was being followed up by provider [facility] [place] until recently.  He has history of psychiatry hospitalization in the past and also has presented to psychiatry hospital as a walk-in.  Patient stated that he is going crazy in his mind.  Other than this complaint he had a very hard time explaining to the writer what was going on with him.  He was noted to be a poor historian and this has been noted in his past psychiatric evaluations as well. When asked what medications he was being prescribed he took out a list of medications that he was being prescribed by his recent outpatient psychiatry provider. Along with that list he also to call the handwritten note written by his mother with stated that patient is very depressed any always thinks that someone is out there to get him.   He thinks that his mother along with others are talking about him. He does not sleep a lot.  His mother does not think the medicines he is on are helpful.  She also stated that patient is very disturbed and he cannot function from ED today routine and really needs help.  He also has a hard time remembering things on a daily basis.  He  does not want to be around anyone.  Writer asked the patient questions from GAD-7 questionnaire, PHQ-9 as well as mood questionnaire.  He scored high on the anxiety and depression questionnaire. Patient acknowledged having low energy levels, with poor sleep and poor concentration.  He does not have a great appetite like he used to.  He feels helpless at times.   He also reported having racing thoughts and feeling restless.  He stated that he cannot relax and feels on the edge most of the times.  He stated that all these thoughts keep going on and on his mind and he cannot concentrate on anything. When asked regarding hypomanic or manic symptoms, such as periods of elevated mood or increased energy levels or decreased need for sleep, he replied no. He answered yes when asked regarding paranoid delusions of being followed and watched.  He also acknowledged having ideas of reference. He denied having any auditory or visual hallucinations. He also believes he has thought insertion and thought broadcasting.  He denied having any suicidal ideations or self-injurious behaviors.  He denied having any homicidal ideations. He could not elaborate when Probation officer asked how he spends his day. He was unable to give a straight answer when the writer asked if he was taking the prescribed Lexapro and buspirone regularly.  Writer requested the patient to allow his mother to join the session.  Patient agreed to have her in the  room.  Mother repeated same things that she had written in the handwritten note.  She expressed her concern about patient not being able to function on a daily basis. She has observed him pacing back-and-forth but denied seeing him mumbling or talking to himself. Mom stated that he generally is able to take care of his grooming and hygiene and she does not need to remind him to take care of that.  Mom stated that he is not in school and is also unemployed.  She stated that he spends most of the day  playing video games.  She informed that she works from home and therefore is able to keep an eye on him.  She stated that patient has been taking his Lexapro regularly along with BuSpar.  She stated that he sometimes takes 3 tablets of buspirone together. She reported that 2 or 3 nights ago she started giving him sertraline at night thinking that it was a medicine meant for sleep.  Writer explained to her that sertraline and Lexapro are both SSRIs and should not be given together.  Mother stated that she does not believe the current medicine Lexapro is helping much. Patient mom said that they remind him restarting sertraline as he did in the past.  Writer recommended that we cut down the Lexapro by reducing the dose to 10 mg for 1 week and then stop.  Start sertraline half tablet 50 mg dose for 1 week and then go on to whole tablet of 100 mg dose. Writer also recommended adding Abilify to help with the paranoid ideations and racing thoughts.  Mother does not think the buspirone has helped much.  Writer recommended trial of hydroxyzine to help with anxiety.  We will continue trazodone at bedtime for sleep. Potential side effects of medication and risks vs benefits of treatment vs non-treatment were explained and discussed. All questions were answered.  Patient and mother were agreeable to these recommendations.  Writer clearly explained to the patient and the mother that this regimen may take a few days before they see any improvement therefore mother needs to monitor the patient and in case he has any suicidal or homicidal ideations or exacerbation of his current symptoms then he can be brought to [facility] behavioral health crisis center which is open 24/7.  Mother verbalized her understanding to this plan.  Past Psychiatric History: Has been seen by different psychiatrist in the past.  Was seen by Dr. Modesta Messing once in March 2019.  Has been hospitalized to psychiatry unit at [facility] in May 2019  for depression.  Has been referred to substance abuse IOP in the past but did not make the appointments. Most recently was being seen by [facility] [place].  Previous Psychotropic Medications: Yes  -Sertraline, trazodone, buspirone, Lexapro  Substance Abuse History in the last 12 months:  Yes.    Consequences of Substance Abuse: Negative  Past Medical History:  Past Medical History:  Diagnosis Date  . GERD (gastroesophageal reflux disease)   . Graves disease 08/18/2015  . Hyperlipidemia   . Hypothyroid   . Palpitations   . PSC (primary sclerosing cholangitis)   . Thrombosis    superior mesenteric vein - 2019  . Vitamin D deficiency     Past Surgical History:  Procedure Laterality Date  . ANTERIOR CRUCIATE LIGAMENT REPAIR  2012  . ERCP N/A 08/19/2018   Procedure: ENDOSCOPIC RETROGRADE CHOLANGIOPANCREATOGRAPHY (ERCP);  Surgeon: Milus Banister, MD;  Location: [facility];  Service:  Endoscopy;  Laterality: N/A;  . LAPAROSCOPIC APPENDECTOMY N/A 05/11/2020   Procedure: APPENDECTOMY LAPAROSCOPIC;  Surgeon: Johnathan Hausen, MD;  Location: [facility];  Service: General;  Laterality: N/A;  . REMOVAL OF STONES  08/19/2018   Procedure: REMOVAL OF STONES;  Surgeon: Milus Banister, MD;  Location: [facility];  Service: Endoscopy;;  . Joan Mayans  08/19/2018   Procedure: Joan Mayans;  Surgeon: Milus Banister, MD;  Location: [facility];  Service: Endoscopy;;  . WISDOM TOOTH EXTRACTION      Family Psychiatric History: Denied  Family History:  Family History  Problem Relation Age of Onset  . Diabetes Mother   . Colon cancer Maternal Grandmother   . Stomach cancer Maternal Grandmother   . Diabetes Maternal Grandmother   . Stomach cancer Maternal Grandfather   . Diabetes Maternal Grandfather     Social History:   Social History   Socioeconomic History  . Marital status: Single    Spouse name: Not on file  . Number of children: Not on file  . Years of education:  Not on file  . Highest education level: Not on file  Occupational History  . Not on file  Tobacco Use  . Smoking status: Current Some Day Smoker  . Smokeless tobacco: Never Used  . Tobacco comment: Marijuana  Vaping Use  . Vaping Use: Former  Substance and Sexual Activity  . Alcohol use: No  . Drug use: Yes    Types: Marijuana    Comment: Last  used yesterday  . Sexual activity: Yes  Other Topics Concern  . Not on file  Social History Narrative  . Not on file   Social Determinants of Health   Financial Resource Strain: Not on file  Food Insecurity: Not on file  Transportation Needs: Not on file  Physical Activity: Not on file  Stress: Not on file  Social Connections: Not on file    Additional Social History: Lives with mom, single, unemployed.  Allergies:   Allergies  Allergen Reactions  . Tylenol [Acetaminophen] Other (See Comments)    r/t elevated LFTs     Metabolic Disorder Labs: Lab Results  Component Value Date   HGBA1C 5.7 (H) 05/05/2020   MPG 117 05/05/2020   MPG 111 02/04/2018   No results found for: PROLACTIN Lab Results  Component Value Date   CHOL 234 (H) 05/05/2020   TRIG 70 05/17/2020   HDL 45 05/05/2020   CHOLHDL 5.2 (H) 05/05/2020   VLDL 21 04/09/2017   LDLCALC 168 (H) 05/05/2020   LDLCALC 110 (H) 06/05/2018   Lab Results  Component Value Date   TSH 9.45 (H) 05/05/2020    Therapeutic Level Labs: No results found for: LITHIUM No results found for: CBMZ No results found for: VALPROATE  Current Medications: Current Outpatient Medications  Medication Sig Dispense Refill  . amLODipine (NORVASC) 2.5 MG tablet Take 1 tablet (2.5 mg total) by mouth daily. 30 tablet 1  . apixaban (ELIQUIS) 5 MG TABS tablet TAKE 1 TABLET(5 MG) BY MOUTH TWICE DAILY (Patient taking differently: Take 5 mg by mouth 2 (two) times daily.) 180 tablet 0  . Cholecalciferol (VITAMIN D3) 5000 units CAPS Take 1 capsule (5,000 Units total) by mouth every other day.  (Patient taking differently: Take 5,000 Units by mouth daily.)    . ezetimibe (ZETIA) 10 MG tablet Take 1 tablet Daily for Cholesterol 90 tablet 0  . levothyroxine (SYNTHROID) 100 MCG tablet Take 1 tablet   Daily   on an empty stomach  with only water for 30 minutes & no Antacid meds, Calcium or Magnesium for 4 hours & avoid Biotin 90 tablet 0  . omeprazole (PRILOSEC) 40 MG capsule Please schedule an Office Visit for further refills. Thank you 30 capsule 1  . ARIPiprazole (ABILIFY) 5 MG tablet Take 1 tablet (5 mg total) by mouth daily. 30 tablet 1  . bisoprolol (ZEBETA) 10 MG tablet Take 1 tablet (10 mg total) by mouth daily. 30 tablet 0  . hydrOXYzine (ATARAX/VISTARIL) 25 MG tablet Take 1 tablet (25 mg total) by mouth 3 (three) times daily as needed for anxiety. 90 tablet 1  . sertraline (ZOLOFT) 100 MG tablet Take 1 tablet (100 mg total) by mouth daily. 30 tablet 1  . traZODone (DESYREL) 100 MG tablet Take 1 tablet (100 mg total) by mouth at bedtime. 30 tablet 1   No current facility-administered medications for this visit.    Musculoskeletal: Strength & Muscle Tone: within normal limits Gait & Station: normal Patient leans: N/A  Psychiatric Specialty Exam: Review of Systems  Blood pressure (!) 165/93, pulse 82, height 6\' 3"  (1.905 m), weight 245 lb (111.1 kg), SpO2 96 %.Body mass index is 30.62 kg/m.  General Appearance: Fairly Groomed  Eye Contact:  Fair  Speech:  Clear and Coherent and Normal Rate  Volume:  Normal  Mood:  Anxious  Affect:  Congruent  Thought Process:  Disorganized and Descriptions of Associations: Circumstantial, internally preoccupied at times  Orientation:  Full (Time, Place, and Person)  Thought Content:  Logical, Ideas of Reference:   Paranoia and Paranoid Ideation  Suicidal Thoughts:  No  Homicidal Thoughts:  No  Memory:  Immediate;   Good Recent;   Fair  Judgement:  Fair  Insight:  Fair  Psychomotor Activity:  Restlessness  Concentration:   Concentration: Fair and Attention Span: Fair  Recall:  [employer] of Knowledge:Fair  Language: Good  Akathisia:  Negative  Handed:  Right  AIMS (if indicated): 0  Assets:  Communication Skills Desire for Improvement Financial Resources/Insurance Housing Social Support  ADL's:  Intact  Cognition: WNL  Sleep:  Poor   Screenings: AIMS   Flowsheet Row Admission (Discharged) from 02/26/2018 in [facility]  AIMS Total Score 0    AUDIT   Flowsheet Row Admission (Discharged) from 02/26/2018 in [facility]  Alcohol Use Disorder Identification Test Final Score (AUDIT) 0    GAD-7   Flowsheet Row Office Visit from 10/04/2020 in [facility]  Total GAD-7 Score 21    PHQ2-9   [place] Office Visit from 10/04/2020 in [facility] Office Visit from 05/05/2020 in [facility] [place] Office Visit from 08/05/2019 in [facility] [place] Office Visit from 04/15/2019 in [facility] [place] Office Visit from 09/04/2018 in [facility]  PHQ-2 Total Score 6 2 0 0 2  PHQ-9 Total Score 21 10 -- -- 5      Assessment and Plan: 29 year old male with history of depression and anxiety now seen as a walk-in with his mother.  Patient is reporting ongoing depressive symptoms with a lot of anxiety symptoms.  He also endorses paranoid ideations of being watched in addition to ideas of reference.  Based on his current Research officer, political party believes he meets criteria for MDD with psychotic features in addition to generalized anxiety disorder.  He is being prescribed with Abilify to help with his  psychotic symptoms.  We will switch back to sertraline for depression anxiety symptoms. Written instructions were provided to the mother to avoid any confusion.   1. Severe episode of  recurrent major depressive disorder, with psychotic features ([place])  -Patient was instructed to take half tablet of Lexapro for a week and then discontinue. -Start sertraline half tablet for 1 week and then take whole tablet, sertraline (ZOLOFT) 100 MG tablet; Take 1 tablet (100 mg total) by mouth daily.  Dispense: 30 tablet; Refill: 1 -Restart traZODone (DESYREL) 100 MG tablet; Take 1 tablet (100 mg total) by mouth at bedtime.  Dispense: 30 tablet; Refill: 1 -Start ARIPiprazole (ABILIFY) 5 MG tablet; Take 1 tablet (5 mg total) by mouth daily.  Dispense: 30 tablet; Refill: 1  2. Generalized anxiety disorder  - sertraline (ZOLOFT) 100 MG tablet; Take 1 tablet (100 mg total) by mouth daily.  Dispense: 30 tablet; Refill: 1 - Start hydrOXYzine (ATARAX/VISTARIL) 25 MG tablet; Take 1 tablet (25 mg total) by mouth 3 (three) times daily as needed for anxiety.  Dispense: 90 tablet; Refill: 1 -Discontinue buspirone due to lack of efficacy.   Mom and patient were informed of the [facility] behavioral [facility] crisis center and were explained that if his symptoms worsen or fail to improve then he can be brought in to request center downstairs for urgent evaluation and management.  F/up in 4 weeks.   Nevada Crane, MD 12/20/202111:09 AM

## 2020-10-16 HISTORY — PX: CHOLECYSTECTOMY: SHX55

## 2020-10-23 ENCOUNTER — Other Ambulatory Visit: Payer: Self-pay | Admitting: Gastroenterology

## 2020-10-23 ENCOUNTER — Other Ambulatory Visit: Payer: Self-pay | Admitting: Adult Health

## 2020-10-23 ENCOUNTER — Other Ambulatory Visit: Payer: Self-pay

## 2020-10-23 DIAGNOSIS — K219 Gastro-esophageal reflux disease without esophagitis: Secondary | ICD-10-CM

## 2020-10-23 DIAGNOSIS — I10 Essential (primary) hypertension: Secondary | ICD-10-CM

## 2020-10-23 MED ORDER — EZETIMIBE 10 MG PO TABS: ORAL_TABLET | ORAL | 0 refills | Status: DC

## 2020-11-10 ENCOUNTER — Other Ambulatory Visit (HOSPITAL_COMMUNITY): Payer: Self-pay | Admitting: Psychiatry

## 2020-11-10 ENCOUNTER — Encounter (HOSPITAL_COMMUNITY): Payer: Self-pay | Admitting: Psychiatry

## 2020-11-10 ENCOUNTER — Other Ambulatory Visit: Payer: Self-pay

## 2020-11-10 ENCOUNTER — Ambulatory Visit (INDEPENDENT_AMBULATORY_CARE_PROVIDER_SITE_OTHER): Payer: No Payment, Other | Admitting: Psychiatry

## 2020-11-10 DIAGNOSIS — F411 Generalized anxiety disorder: Secondary | ICD-10-CM | POA: Diagnosis not present

## 2020-11-10 DIAGNOSIS — F333 Major depressive disorder, recurrent, severe with psychotic symptoms: Secondary | ICD-10-CM

## 2020-11-10 MED ORDER — ARIPIPRAZOLE 5 MG PO TABS: 5.0000 mg | ORAL_TABLET | Freq: Every day | ORAL | 1 refills | Status: DC

## 2020-11-10 MED ORDER — SERTRALINE HCL 100 MG PO TABS: 100.0000 mg | ORAL_TABLET | Freq: Every day | ORAL | 1 refills | Status: DC

## 2020-11-10 MED ORDER — TRAZODONE HCL 100 MG PO TABS: 100.0000 mg | ORAL_TABLET | Freq: Every day | ORAL | 1 refills | Status: DC

## 2020-11-10 MED FILL — ARIPiprazole 5 MG TABS: 5 | 30 days supply | Qty: 30 | Fill #0

## 2020-11-10 MED FILL — SERTRALINE HCL 100 MG TAB: 100 | 30 days supply | Qty: 30 | Fill #0

## 2020-11-10 MED FILL — TRAZODONE HCL 100 MG TABS: 100 | 30 days supply | Qty: 30 | Fill #0

## 2020-11-10 NOTE — Progress Notes (Signed)
[facility] OP Progress Note  Patient Identification: Kyle Gonzales MRN:  [id number] Date of Evaluation:  11/10/2020    Chief Complaint:  " I am doing better."   Visit Diagnosis:    ICD-10-CM   1. Severe episode of recurrent major depressive disorder, with psychotic features (HCC)  F33.3 ARIPiprazole (ABILIFY) 5 MG tablet    sertraline (ZOLOFT) 100 MG tablet    traZODone (DESYREL) 100 MG tablet  2. Generalized anxiety disorder  F41.1 sertraline (ZOLOFT) 100 MG tablet    History of Present Illness: The medicines have really helped him.  He stated that his mind is not going all over the place anymore.  He reported improvement in the auditory hallucinations.  He stated that he is no longer hearing the 2 voices talking in his head.  He stated that he is sleeping much better now. He said he feels he is in a better position.  Writer asked him if he thinks he needs a adjustment in his doses for optimal effects, patient stated that he thinks he is fine and would like to continue the same dose for now.  Writer asked him regarding his plans for 2022, he said he just wants to be a better version of himself.  When Probation officer asked him about his employment plans, he stated that he is looking for a job.  Writer asked him if his mother had any inputs, he said he she did not give him any handwritten notes like she did last time.  Patient looked less anxious and less internally preoccupied.  He made good eye contact and was not as disorganized as he was last time.  Past Psychiatric History: Has been seen by different psychiatrist in the past.  Was seen by Dr. Modesta Messing once in March 2019.  Has been hospitalized to psychiatry unit at [facility] in May 2019 for depression.  Has been referred to substance abuse IOP in the past but did not make the appointments. Most recently was being seen by [facility] [place].  Previous Psychotropic Medications: Yes   -Sertraline, trazodone, buspirone, Lexapro  Substance Abuse History in the last 12 months:  Yes.    Consequences of Substance Abuse: Negative  Past Medical History:  Past Medical History:  Diagnosis Date  . GERD (gastroesophageal reflux disease)   . Graves disease 08/18/2015  . Hyperlipidemia   . Hypothyroid   . Palpitations   . PSC (primary sclerosing cholangitis)   . Thrombosis    superior mesenteric vein - 2019  . Vitamin D deficiency     Past Surgical History:  Procedure Laterality Date  . ANTERIOR CRUCIATE LIGAMENT REPAIR  2012  . ERCP N/A 08/19/2018   Procedure: ENDOSCOPIC RETROGRADE CHOLANGIOPANCREATOGRAPHY (ERCP);  Surgeon: Milus Banister, MD;  Location: [facility];  Service: Endoscopy;  Laterality: N/A;  . LAPAROSCOPIC APPENDECTOMY N/A 05/11/2020   Procedure: APPENDECTOMY LAPAROSCOPIC;  Surgeon: Johnathan Hausen, MD;  [facility]: [facility];  Service: General;  Laterality: N/A;  . REMOVAL OF STONES  08/19/2018   Procedure: REMOVAL OF STONES;  Surgeon: Milus Banister, MD;  Location: [facility];  Service: Endoscopy;;  . Joan Mayans  08/19/2018   Procedure: Joan Mayans;  Surgeon: Milus Banister, MD;  [facility]: [facility];  Service: Endoscopy;;  . WISDOM TOOTH EXTRACTION      Family Psychiatric History: Denied  Family History:  Family History  Problem Relation Age of Onset  . Diabetes Mother   . Colon cancer Maternal Grandmother   . Stomach cancer Maternal  Grandmother   . Diabetes Maternal Grandmother   . Stomach cancer Maternal Grandfather   . Diabetes Maternal Grandfather     Social History:   Social History   Socioeconomic History  . Marital status: Single    Spouse name: Not on file  . Number of children: Not on file  . Years of education: Not on file  . Highest education level: Not on file  Occupational History  . Not on file  Tobacco Use  . Smoking status: Current Some Day Smoker  . Smokeless tobacco: Never Used  . Tobacco comment:  Marijuana  Vaping Use  . Vaping Use: Former  Substance and Sexual Activity  . Alcohol use: No  . Drug use: Yes    Types: Marijuana    Comment: Last  used yesterday  . Sexual activity: Yes  Other Topics Concern  . Not on file  Social History Narrative  . Not on file   Social Determinants of Health   Financial Resource Strain: Not on file  Food Insecurity: Not on file  Transportation Needs: Not on file  Physical Activity: Not on file  Stress: Not on file  Social Connections: Not on file    Additional Social History: Lives with mom, single, unemployed.  Allergies:   Allergies  Allergen Reactions  . Tylenol [Acetaminophen] Other (See Comments)    r/t elevated LFTs     Metabolic Disorder Labs: Lab Results  Component Value Date   HGBA1C 5.7 (H) 05/05/2020   MPG 117 05/05/2020   MPG 111 02/04/2018   No results found for: PROLACTIN Lab Results  Component Value Date   CHOL 234 (H) 05/05/2020   TRIG 70 05/17/2020   HDL 45 05/05/2020   CHOLHDL 5.2 (H) 05/05/2020   VLDL 21 04/09/2017   LDLCALC 168 (H) 05/05/2020   LDLCALC 110 (H) 06/05/2018   Lab Results  Component Value Date   TSH 9.45 (H) 05/05/2020    Therapeutic Level Labs: No results found for: LITHIUM No results found for: CBMZ No results found for: VALPROATE  Current Medications: Current Outpatient Medications  Medication Sig Dispense Refill  . amLODipine (NORVASC) 2.5 MG tablet Take 1 tablet (2.5 mg total) by mouth daily. 30 tablet 1  . apixaban (ELIQUIS) 5 MG TABS tablet TAKE 1 TABLET(5 MG) BY MOUTH TWICE DAILY (Patient taking differently: Take 5 mg by mouth 2 (two) times daily.) 180 tablet 0  . ARIPiprazole (ABILIFY) 5 MG tablet Take 1 tablet (5 mg total) by mouth daily. 30 tablet 1  . bisoprolol (ZEBETA) 10 MG tablet Take 1 tablet (10 mg total) by mouth daily. 30 tablet 0  . bisoprolol-hydrochlorothiazide (ZIAC) 10-6.25 MG tablet Take     1 tablet     Daily      for BP       - Must have Office Visit  before Refill 21 tablet 0  . Cholecalciferol (VITAMIN D3) 5000 units CAPS Take 1 capsule (5,000 Units total) by mouth every other day. (Patient taking differently: Take 5,000 Units by mouth daily.)    . ezetimibe (ZETIA) 10 MG tablet Take      1 tablet       Daily        for Cholesterol    - Must have Office Visit before Refill 21 tablet 0  . hydrOXYzine (ATARAX/VISTARIL) 25 MG tablet Take 1 tablet (25 mg total) by mouth 3 (three) times daily as needed for anxiety. 90 tablet 1  . levothyroxine (SYNTHROID) 100 MCG  tablet Take 1 tablet   Daily   on an empty stomach with only water for 30 minutes & no Antacid meds, Calcium or Magnesium for 4 hours & avoid Biotin 90 tablet 0  . omeprazole (PRILOSEC) 40 MG capsule Please schedule an Office Visit for further refills. Thank you 30 capsule 1  . sertraline (ZOLOFT) 100 MG tablet Take 1 tablet (100 mg total) by mouth daily. 30 tablet 1  . traZODone (DESYREL) 100 MG tablet Take 1 tablet (100 mg total) by mouth at bedtime. 30 tablet 1   No current facility-administered medications for this visit.    Musculoskeletal: Strength & Muscle Tone: within normal limits Gait & Station: normal Patient leans: N/A  Psychiatric Specialty Exam: Review of Systems  There were no vitals taken for this visit.There is no height or weight on file to calculate BMI.  General Appearance: Fairly Groomed  Eye Contact:  Good  Speech:  Clear and Coherent and Normal Rate  Volume:  Normal  Mood:  Euthymic  Affect:  Congruent  Thought Process:  Goal Directed and Descriptions of Associations: Intact, minimally internally preoccupied at times but improved from last visit  Orientation:  Full (Time, Place, and Person)  Thought Content:  Logical, Ideas of Reference:   Paranoia and Paranoid Ideation- improved  Suicidal Thoughts:  No  Homicidal Thoughts:  No  Memory:  Immediate;   Good Recent;   Fair  Judgement:  Fair  Insight:  Fair  Psychomotor Activity:  Normal  Concentration:   Concentration: Good and Attention Span: Fair  Recall:  [employer] of Knowledge:Fair  Language: Good  Akathisia:  Negative  Handed:  Right  AIMS (if indicated): 0  Assets:  Communication Skills Desire for Improvement Financial Resources/Insurance Housing Social Support  ADL's:  Intact  Cognition: WNL  Sleep:  Good     Assessment and Plan: Patient appears to be doing better compared to his last visit a few weeks ago.  Patient is reporting improvement in his anxiety, mood as well as hallucinations.  He is also sleeping better.   1. Severe episode of recurrent major depressive disorder, with psychotic features ([place])  - ARIPiprazole (ABILIFY) 5 MG tablet; Take 1 tablet (5 mg total) by mouth daily.  Dispense: 30 tablet; Refill: 1 - sertraline (ZOLOFT) 100 MG tablet; Take 1 tablet (100 mg total) by mouth daily.  Dispense: 30 tablet; Refill: 1 - traZODone (DESYREL) 100 MG tablet; Take 1 tablet (100 mg total) by mouth at bedtime.  Dispense: 30 tablet; Refill: 1  2. Generalized anxiety disorder  - sertraline (ZOLOFT) 100 MG tablet; Take 1 tablet (100 mg total) by mouth daily.  Dispense: 30 tablet; Refill: 1  Continue same medication regimen. Follow up in 2 months.   [place] Crane, MD 1/26/20223:21 PM

## 2020-11-26 MED FILL — ARIPiprazole 5 MG TABS: 5 | 30 days supply | Qty: 30 | Fill #1

## 2020-11-26 MED FILL — SERTRALINE HCL 100 MG TAB: 100 | 30 days supply | Qty: 30 | Fill #1

## 2020-11-26 MED FILL — TRAZODONE HCL 100 MG TABS: 100 | 30 days supply | Qty: 30 | Fill #1

## 2020-12-06 ENCOUNTER — Other Ambulatory Visit: Payer: Self-pay

## 2020-12-06 DIAGNOSIS — I10 Essential (primary) hypertension: Secondary | ICD-10-CM

## 2020-12-06 MED ORDER — EZETIMIBE 10 MG PO TABS: ORAL_TABLET | ORAL | 0 refills | Status: DC

## 2020-12-06 MED ORDER — BISOPROLOL-HYDROCHLOROTHIAZIDE 10-6.25 MG PO TABS: ORAL_TABLET | ORAL | 0 refills | Status: DC

## 2020-12-24 ENCOUNTER — Other Ambulatory Visit: Payer: Self-pay | Admitting: Adult Health

## 2020-12-24 DIAGNOSIS — R112 Nausea with vomiting, unspecified: Secondary | ICD-10-CM

## 2020-12-24 MED FILL — hydrOXYzine HCL 25 MG TABS: 25 | 30 days supply | Qty: 90 | Fill #1

## 2021-01-05 ENCOUNTER — Ambulatory Visit (HOSPITAL_COMMUNITY): Payer: No Payment, Other | Admitting: Psychiatry

## 2021-01-15 ENCOUNTER — Other Ambulatory Visit: Payer: Self-pay

## 2021-02-16 ENCOUNTER — Telehealth (INDEPENDENT_AMBULATORY_CARE_PROVIDER_SITE_OTHER): Payer: No Payment, Other | Admitting: Psychiatry

## 2021-02-16 ENCOUNTER — Other Ambulatory Visit: Payer: Self-pay

## 2021-02-16 ENCOUNTER — Encounter (HOSPITAL_COMMUNITY): Payer: Self-pay | Admitting: Psychiatry

## 2021-02-16 DIAGNOSIS — F411 Generalized anxiety disorder: Secondary | ICD-10-CM | POA: Diagnosis not present

## 2021-02-16 DIAGNOSIS — F333 Major depressive disorder, recurrent, severe with psychotic symptoms: Secondary | ICD-10-CM | POA: Diagnosis not present

## 2021-02-16 MED ORDER — TRAZODONE HCL 100 MG PO TABS
ORAL_TABLET | Freq: Every day | ORAL | 1 refills | Status: DC
  Filled 2021-02-16: qty 30, 30d supply, fill #0

## 2021-02-16 MED ORDER — ARIPIPRAZOLE 5 MG PO TABS
ORAL_TABLET | Freq: Every day | ORAL | 1 refills | Status: DC
  Filled 2021-02-16: qty 30, 30d supply, fill #0

## 2021-02-16 MED ORDER — SERTRALINE HCL 100 MG PO TABS
ORAL_TABLET | Freq: Every day | ORAL | 1 refills | Status: DC
  Filled 2021-02-16: qty 30, 30d supply, fill #0

## 2021-02-16 NOTE — Progress Notes (Signed)
[place] [facility] Progress Note  Virtual Visit via Video Note  I connected with [name] on 02/16/21 at 10:40 AM EDT by a video enabled telemedicine application and verified that I am speaking with the correct person using two identifiers.  Location: Patient: Home Provider: Clinic   I discussed the limitations of evaluation and management by telemedicine and the availability of in person appointments. The patient expressed understanding and agreed to proceed.  I provided 16 minutes of non-face-to-face time during this encounter.    Patient Identification: Kyle Gonzales MRN:  [id number] Date of Evaluation:  02/16/2021    Chief Complaint:  " I am doing fine."   Visit Diagnosis:    ICD-10-CM   1. Severe episode of recurrent major depressive disorder, with psychotic features ([place])  F33.3   2. Generalized anxiety disorder  F41.1     History of Present Illness: Patient informed that he was not taking his medications regularly however being off this week he started taking them every day.  He stated that he thinks he does okay when he takes them regularly so he would like to give that a try. Writer asked him how he spends his day he stated that he is basically at home pretty much the whole time. Writer asked him about his employment situation and he replied that he had started working however he quit a few days ago because he felt that everyone working there was against him. Writer encouraged him to take his medications on a daily basis because they can help him with his paranoid symptoms. He stated that he understands and is going to try his best. Patient seems to be calmer and not as anxious and internally preoccupied as he was when the writer saw him few weeks ago. He denied any suicidal or homicidal ideations. He denies any side effects of his medicines when he takes them.   Previous Psychotropic Medications: Yes  -Sertraline,  trazodone, buspirone, Lexapro  Substance Abuse History in the last 12 months:  Yes.    Consequences of Substance Abuse: Negative  Past Medical History:  Past Medical History:  Diagnosis Date  . GERD (gastroesophageal reflux disease)   . Graves disease 08/18/2015  . Hyperlipidemia   . Hypothyroid   . Palpitations   . PSC (primary sclerosing cholangitis)   . Thrombosis    superior mesenteric vein - 2019  . Vitamin D deficiency     Past Surgical History:  Procedure Laterality Date  . ANTERIOR CRUCIATE LIGAMENT REPAIR  2012  . ERCP N/A 08/19/2018   Procedure: ENDOSCOPIC RETROGRADE CHOLANGIOPANCREATOGRAPHY (ERCP);  Surgeon: Milus Banister, MD;  Location: [facility];  Service: Endoscopy;  Laterality: N/A;  . LAPAROSCOPIC APPENDECTOMY N/A 05/11/2020   Procedure: APPENDECTOMY LAPAROSCOPIC;  Surgeon: Johnathan Hausen, MD;  Location: [facility];  Service: General;  Laterality: N/A;  . REMOVAL OF STONES  08/19/2018   Procedure: REMOVAL OF STONES;  Surgeon: Milus Banister, MD;  Location: [facility];  Service: Endoscopy;;  . Joan Mayans  08/19/2018   Procedure: Joan Mayans;  Surgeon: Milus Banister, MD;  Location: [facility];  Service: Endoscopy;;  . WISDOM TOOTH EXTRACTION      Family Psychiatric History: Denied  Family History:  Family History  Problem Relation Age of Onset  . Diabetes Mother   . Colon cancer Maternal Grandmother   . Stomach cancer Maternal Grandmother   . Diabetes Maternal Grandmother   . Stomach cancer Maternal Grandfather   . Diabetes Maternal Grandfather  Social History:   Social History   Socioeconomic History  . Marital status: Single    Spouse name: Not on file  . Number of children: Not on file  . Years of education: Not on file  . Highest education level: Not on file  Occupational History  . Not on file  Tobacco Use  . Smoking status: Current Some Day Smoker  . Smokeless tobacco: Never Used  . Tobacco comment: Marijuana  Vaping  Use  . Vaping Use: Former  Substance and Sexual Activity  . Alcohol use: No  . Drug use: Yes    Types: Marijuana    Comment: Last  used yesterday  . Sexual activity: Yes  Other Topics Concern  . Not on file  Social History Narrative  . Not on file   Social Determinants of Health   Financial Resource Strain: Not on file  Food Insecurity: Not on file  Transportation Needs: Not on file  Physical Activity: Not on file  Stress: Not on file  Social Connections: Not on file    Additional Social History: Lives with mom, single, unemployed.  Allergies:   Allergies  Allergen Reactions  . Tylenol [Acetaminophen] Other (See Comments)    r/t elevated LFTs     Metabolic Disorder Labs: Lab Results  Component Value Date   HGBA1C 5.7 (H) 05/05/2020   MPG 117 05/05/2020   MPG 111 02/04/2018   No results found for: PROLACTIN Lab Results  Component Value Date   CHOL 234 (H) 05/05/2020   TRIG 70 05/17/2020   HDL 45 05/05/2020   CHOLHDL 5.2 (H) 05/05/2020   VLDL 21 04/09/2017   LDLCALC 168 (H) 05/05/2020   LDLCALC 110 (H) 06/05/2018   Lab Results  Component Value Date   TSH 9.45 (H) 05/05/2020    Therapeutic Level Labs: No results found for: LITHIUM No results found for: CBMZ No results found for: VALPROATE  Current Medications: Current Outpatient Medications  Medication Sig Dispense Refill  . amLODipine (NORVASC) 2.5 MG tablet Take 1 tablet (2.5 mg total) by mouth daily. 30 tablet 1  . apixaban (ELIQUIS) 5 MG TABS tablet TAKE 1 TABLET(5 MG) BY MOUTH TWICE DAILY (Patient taking differently: Take 5 mg by mouth 2 (two) times daily.) 180 tablet 0  . ARIPiprazole (ABILIFY) 5 MG tablet TAKE 1 TABLET (5 MG TOTAL) BY MOUTH DAILY. 30 tablet 1  . bisoprolol-hydrochlorothiazide (ZIAC) 10-6.25 MG tablet Take     1 tablet     Daily      for BP       - Must have Office Visit before Refill 14 tablet 0  . Cholecalciferol (VITAMIN D3) 5000 units CAPS Take 1 capsule (5,000 Units total)  by mouth every other day. (Patient taking differently: Take 5,000 Units by mouth daily.)    . ezetimibe (ZETIA) 10 MG tablet Take      1 tablet       Daily        for Cholesterol    - Must have Office Visit before Refill 14 tablet 0  . hydrOXYzine (ATARAX/VISTARIL) 25 MG tablet TAKE 1 TABLET (25 MG TOTAL) BY MOUTH 3 (THREE) TIMES DAILY AS NEEDED FOR ANXIETY. 90 tablet 1  . levothyroxine (SYNTHROID) 100 MCG tablet Take 1 tablet   Daily   on an empty stomach with only water for 30 minutes & no Antacid meds, Calcium or Magnesium for 4 hours & avoid Biotin 90 tablet 0  . omeprazole (PRILOSEC) 40 MG capsule Please  schedule an Office Visit for further refills. Thank you 30 capsule 1  . sertraline (ZOLOFT) 100 MG tablet TAKE 1 TABLET (100 MG TOTAL) BY MOUTH DAILY. 30 tablet 1  . traZODone (DESYREL) 100 MG tablet TAKE 1 TABLET (100 MG TOTAL) BY MOUTH AT BEDTIME. 30 tablet 1   No current facility-administered medications for this visit.    Musculoskeletal: Strength & Muscle Tone: within normal limits Gait & Station: normal Patient leans: N/A  Psychiatric Specialty Exam: Review of Systems  There were no vitals taken for this visit.There is no height or weight on file to calculate BMI.  General Appearance: Fairly Groomed  Eye Contact:  Good  Speech:  Clear and Coherent and Normal Rate  Volume:  Normal  Mood:  Euthymic  Affect:  Congruent  Thought Process:  Goal Directed and Descriptions of Associations: Intact  Orientation:  Full (Time, Place, and Person)  Thought Content:  Logical, Ideas of Reference:   Paranoia and Paranoid Ideation- still present  Suicidal Thoughts:  No  Homicidal Thoughts:  No  Memory:  Immediate;   Good Recent;   Fair  Judgement:  Fair  Insight:  Fair  Psychomotor Activity:  Normal  Concentration:  Concentration: Good and Attention Span: Fair  Recall:  [employer] of Knowledge:Fair  Language: Good  Akathisia:  Negative  Handed:  Right  AIMS (if indicated): 0   Assets:  Communication Skills Desire for Improvement Financial Resources/Insurance Housing Social Support  ADL's:  Intact  Cognition: WNL  Sleep:  Good     Assessment and Plan: Patient has not been taking his medications regularly.  He stated that he has started taking them on a daily basis beginning of this week.  He intends to be more regular and taking them.  He stated that his mood has been stable and he denied any other concerns.    1. Severe episode of recurrent major depressive disorder, with psychotic features ([place])  - ARIPiprazole (ABILIFY) 5 MG tablet; Take 1 tablet (5 mg total) by mouth daily.  Dispense: 30 tablet; Refill: 1 - sertraline (ZOLOFT) 100 MG tablet; Take 1 tablet (100 mg total) by mouth daily.  Dispense: 30 tablet; Refill: 1 - traZODone (DESYREL) 100 MG tablet; Take 1 tablet (100 mg total) by mouth at bedtime.  Dispense: 30 tablet; Refill: 1  2. Generalized anxiety disorder  - sertraline (ZOLOFT) 100 MG tablet; Take 1 tablet (100 mg total) by mouth daily.  Dispense: 30 tablet; Refill: 1  Continue same medication regimen. Follow up in 2 months. The relevance of compliance to medications was reinforced.  N[place]ada Crane, MD 5/4/202210:48 AM

## 2021-02-18 ENCOUNTER — Other Ambulatory Visit: Payer: Self-pay | Admitting: Internal Medicine

## 2021-02-18 ENCOUNTER — Other Ambulatory Visit: Payer: Self-pay

## 2021-02-18 ENCOUNTER — Other Ambulatory Visit (HOSPITAL_COMMUNITY): Payer: Self-pay | Admitting: Psychiatry

## 2021-02-18 DIAGNOSIS — F411 Generalized anxiety disorder: Secondary | ICD-10-CM

## 2021-02-18 MED ORDER — TADALAFIL 20 MG PO TABS: ORAL_TABLET | ORAL | 0 refills | Status: DC

## 2021-02-21 ENCOUNTER — Other Ambulatory Visit: Payer: Self-pay

## 2021-02-21 MED ORDER — HYDROXYZINE HCL 25 MG PO TABS
ORAL_TABLET | Freq: Three times a day (TID) | ORAL | 1 refills | Status: DC | PRN
  Filled 2021-02-21: qty 90, 30d supply, fill #0

## 2021-02-24 ENCOUNTER — Other Ambulatory Visit: Payer: Self-pay

## 2021-03-07 ENCOUNTER — Other Ambulatory Visit: Payer: Self-pay

## 2021-03-07 DIAGNOSIS — I10 Essential (primary) hypertension: Secondary | ICD-10-CM

## 2021-03-07 MED ORDER — BISOPROLOL-HYDROCHLOROTHIAZIDE 10-6.25 MG PO TABS: ORAL_TABLET | ORAL | 0 refills | Status: DC

## 2021-03-31 ENCOUNTER — Other Ambulatory Visit: Payer: Self-pay | Admitting: Gastroenterology

## 2021-03-31 DIAGNOSIS — K219 Gastro-esophageal reflux disease without esophagitis: Secondary | ICD-10-CM

## 2021-04-14 ENCOUNTER — Encounter (HOSPITAL_COMMUNITY): Payer: Self-pay | Admitting: Psychiatry

## 2021-04-14 ENCOUNTER — Telehealth (INDEPENDENT_AMBULATORY_CARE_PROVIDER_SITE_OTHER): Payer: No Payment, Other | Admitting: Psychiatry

## 2021-04-14 DIAGNOSIS — F411 Generalized anxiety disorder: Secondary | ICD-10-CM | POA: Diagnosis not present

## 2021-04-14 DIAGNOSIS — F333 Major depressive disorder, recurrent, severe with psychotic symptoms: Secondary | ICD-10-CM

## 2021-04-14 MED ORDER — SERTRALINE HCL 100 MG PO TABS: ORAL_TABLET | Freq: Every day | ORAL | 1 refills | Status: DC

## 2021-04-14 MED ORDER — HYDROXYZINE HCL 50 MG PO TABS: 50.0000 mg | ORAL_TABLET | Freq: Three times a day (TID) | ORAL | 1 refills | Status: DC | PRN

## 2021-04-14 MED ORDER — ARIPIPRAZOLE 10 MG PO TABS: 10.0000 mg | ORAL_TABLET | Freq: Every day | ORAL | 2 refills | Status: DC

## 2021-04-14 MED ORDER — TRAZODONE HCL 100 MG PO TABS: ORAL_TABLET | Freq: Every day | ORAL | 1 refills | Status: DC

## 2021-04-14 NOTE — Progress Notes (Signed)
[place] MD/PA/NP OP Progress Note Virtual Visit via Video Note  I connected with Kyle Gonzales on 04/14/21 at 11:30 AM EDT by a video enabled telemedicine application and verified that I am speaking with the correct person using two identifiers.  Location: Patient: Home Provider: Clinic   I discussed the limitations of evaluation and management by telemedicine and the availability of in person appointments. The patient expressed understanding and agreed to proceed.  I provided 30 minutes of non-face-to-face time during this encounter.   04/14/2021 12:33 PM Kyle Gonzales  MRN:  [id number]  Chief Complaint: "I have my days. I don't take my meds everyday and am always thinking"  HPI: 30 year old male seen today for follow-up psychiatric evaluation.  He is a former patient of Dr. Jean Rosenthal is being transferred to writer for medication management.  He has a psychiatric history of depression, anxiety, and marijuana use.  He notes his medications are somewhat effective in managing his psychiatric conditions.  Today patient is observed lying in bed.  He notes that he is just getting up.  During exam he was pleasant, cooperative, and engaged in conversation.  Patient's voice is low and the rate is slow.  He informed Probation officer that he has his days.  He notes that he takes his infrequently notes because of it he is always thinking.  He endorses racing thoughts, irritability, distractibility, and paranoia.  He notes that he believes people can read his thoughts.  He denies VAH.  Patient informed Probation officer that he recently stopped smoking marijuana.  He notes that he last smoked this Monday.  Patient informed Probation officer that he is anxious and depressed most days.  Provider conducted a GAD-7 and patient scored a 21.  Provider also conducted a PHQ-9 and patient scored a 23.  He informed Probation officer that he is tired most days and has recently gained over 20 pounds.  He notes that he associates the symptoms with his  Graves' disease.  Patient notes that he sleeps approximately 8 hours at night however notes he also naps during the day.  He endorses having a good appetite.   Visit Diagnosis:    ICD-10-CM   1. Severe episode of recurrent major depressive disorder, with psychotic features ([facility])  F33.3 ARIPiprazole (ABILIFY) 10 MG tablet    sertraline (ZOLOFT) 100 MG tablet    traZODone (DESYREL) 100 MG tablet    2. Generalized anxiety disorder  F41.1 sertraline (ZOLOFT) 100 MG tablet    hydrOXYzine (ATARAX/VISTARIL) 50 MG tablet      Past Psychiatric History: depression, anxiety, and marijuana use Past Medical History:  Past Medical History:  Diagnosis Date   GERD (gastroesophageal reflux disease)    Graves disease 08/18/2015   Hyperlipidemia    Hypothyroid    Palpitations    PSC (primary sclerosing cholangitis)    Thrombosis    superior mesenteric vein - 2019   Vitamin D deficiency     Past Surgical History:  Procedure Laterality Date   ANTERIOR CRUCIATE LIGAMENT REPAIR  2012   ERCP N/A 08/19/2018   Procedure: ENDOSCOPIC RETROGRADE CHOLANGIOPANCREATOGRAPHY (ERCP);  Surgeon: Milus Banister, MD;  Location: [facility];  Service: Endoscopy;  Laterality: N/A;   LAPAROSCOPIC APPENDECTOMY N/A 05/11/2020   Procedure: APPENDECTOMY LAPAROSCOPIC;  Surgeon: Johnathan Hausen, MD;  Location: [facility];  Service: General;  Laterality: N/A;   REMOVAL OF STONES  08/19/2018   Procedure: REMOVAL OF STONES;  Surgeon: Milus Banister, MD;  Location: [facility];  Service: Endoscopy;;  SPHINCTEROTOMY  08/19/2018   Procedure: SPHINCTEROTOMY;  Surgeon: Milus Banister, MD;  Location: [facility];  Service: Endoscopy;;   WISDOM TOOTH EXTRACTION      Family Psychiatric History: Denies  Family History:  Family History  Problem Relation Age of Onset   Diabetes Mother    Colon cancer Maternal Grandmother    Stomach cancer Maternal Grandmother    Diabetes Maternal Grandmother    Stomach cancer Maternal  Grandfather    Diabetes Maternal Grandfather     Social History:  Social History   Socioeconomic History   Marital status: Single    Spouse name: Not on file   Number of children: Not on file   Years of education: Not on file   Highest education level: Not on file  Occupational History   Not on file  Tobacco Use   Smoking status: Some Days    Pack years: 0.00   Smokeless tobacco: Never   Tobacco comments:    Marijuana  Vaping Use   Vaping Use: Former  Substance and Sexual Activity   Alcohol use: No   Drug use: Yes    Types: Marijuana    Comment: Last  used yesterday   Sexual activity: Yes  Other Topics Concern   Not on file  Social History Narrative   Not on file   Social Determinants of Health   Financial Resource Strain: Not on file  Food Insecurity: Not on file  Transportation Needs: Not on file  Physical Activity: Not on file  Stress: Not on file  Social Connections: Not on file    Allergies:  Allergies  Allergen Reactions   Tylenol [Acetaminophen] Other (See Comments)    r/t elevated LFTs     Metabolic Disorder Labs: Lab Results  Component Value Date   HGBA1C 5.7 (H) 05/05/2020   MPG 117 05/05/2020   MPG 111 02/04/2018   No results found for: PROLACTIN Lab Results  Component Value Date   CHOL 234 (H) 05/05/2020   TRIG 70 05/17/2020   HDL 45 05/05/2020   CHOLHDL 5.2 (H) 05/05/2020   VLDL 21 04/09/2017   LDLCALC 168 (H) 05/05/2020   LDLCALC 110 (H) 06/05/2018   Lab Results  Component Value Date   TSH 9.45 (H) 05/05/2020   TSH 7.79 (H) 11/18/2019    Therapeutic Level Labs: No results found for: LITHIUM No results found for: VALPROATE No components found for:  CBMZ  Current Medications: Current Outpatient Medications  Medication Sig Dispense Refill   amLODipine (NORVASC) 2.5 MG tablet Take 1 tablet (2.5 mg total) by mouth daily. 30 tablet 1   apixaban (ELIQUIS) 5 MG TABS tablet TAKE 1 TABLET(5 MG) BY MOUTH TWICE DAILY (Patient  taking differently: Take 5 mg by mouth 2 (two) times daily.) 180 tablet 0   ARIPiprazole (ABILIFY) 10 MG tablet Take 1 tablet (10 mg total) by mouth daily. 30 tablet 2   bisoprolol-hydrochlorothiazide (ZIAC) 10-6.25 MG tablet LAST REFILL - Take 1 tablet Daily for BP - ABSOLUTELY  Must have Office Visit before Refill 7 tablet 0   Cholecalciferol (VITAMIN D3) 5000 units CAPS Take 1 capsule (5,000 Units total) by mouth every other day. (Patient taking differently: Take 5,000 Units by mouth daily.)     ezetimibe (ZETIA) 10 MG tablet Take      1 tablet       Daily        for Cholesterol    - Must have Office Visit before Refill 14 tablet 0  hydrOXYzine (ATARAX/VISTARIL) 50 MG tablet Take 1 tablet (50 mg total) by mouth 3 (three) times daily as needed for anxiety. 90 tablet 1   levothyroxine (SYNTHROID) 100 MCG tablet Take 1 tablet   Daily   on an empty stomach with only water for 30 minutes & no Antacid meds, Calcium or Magnesium for 4 hours & avoid Biotin 90 tablet 0   omeprazole (PRILOSEC) 40 MG capsule Take 1 capsule (40 mg total) by mouth daily. Please keep your August appointment for further refills 30 capsule 1   sertraline (ZOLOFT) 100 MG tablet TAKE 1 TABLET (100 MG TOTAL) BY MOUTH DAILY. 30 tablet 1   tadalafil (CIALIS) 20 MG tablet Take  1/2 to 1 tablet  every 2 to 3 days  as needed for XXXX 30 tablet 0   traZODone (DESYREL) 100 MG tablet TAKE 1 TABLET (100 MG TOTAL) BY MOUTH AT BEDTIME. 30 tablet 1   No current facility-administered medications for this visit.     Musculoskeletal: Strength & Muscle Tone:  Unable to assess due to telhealt visit Gait & Station:  Unable to assess due to telhealt visit Patient leans: N/A  Psychiatric Specialty Exam: Review of Systems  There were no vitals taken for this visit.There is no height or weight on file to calculate BMI.  General Appearance: Well Groomed  Eye Contact:  Good  Speech:  Clear and Coherent and Normal Rate  Volume:  Normal  Mood:   Angry and Anxious  Affect:  Appropriate and Congruent  Thought Process:  Coherent, Goal Directed, and Linear  Orientation:  Full (Time, Place, and Person)  Thought Content: WDL and Logical   Suicidal Thoughts:  No  Homicidal Thoughts:  No  Memory:  Immediate;   Good Recent;   Good Remote;   Good  Judgement:  Good  Insight:  Good  Psychomotor Activity:  Normal  Concentration:  Concentration: Good and Attention Span: Good  Recall:  Good  Fund of Knowledge: Good  Language: Good  Akathisia:  No  Handed:  Right  AIMS (if indicated): not done  Assets:  Communication Skills Desire for Improvement Financial Resources/Insurance Housing Physical Health Social Support  ADL's:  Intact  Cognition: WNL  Sleep:  Good   Screenings: AIMS    Flowsheet Row Admission (Discharged) from 02/26/2018 in [facility]  AIMS Total Score 0      AUDIT    Flowsheet Row Admission (Discharged) from 02/26/2018 in [place] [facility]  Alcohol Use Disorder Identification Test Final Score (AUDIT) 0      GAD-7    Flowsheet Row Video Visit from 04/14/2021 in [facility] Office Visit from 10/04/2020 in [facility]  Total GAD-7 Score 21 21      PHQ2-9    Flowsheet Row Video Visit from 04/14/2021 in [facility] Office Visit from 10/04/2020 in [facility] Office Visit from 05/05/2020 in [place] [facility] [place] Office Visit from 08/05/2019 in [place] [facility] Office Visit from 04/15/2019 in [place] [facility]  PHQ-2 Total Score 6 6 2  0 0  PHQ-9 Total Score 23 21 10  -- --      Flowsheet Row Video Visit from 04/14/2021 in [place] Error: Q7 should not be populated when Q6 is No        Assessment  and Plan: Patient endorses  symptoms of anxiety, depression, and paranoid ideations.  Today he is agreeable to increasing Abilify a 5 mg to 10 mg to help manage pressure and paranoia.  Patient is also agreeable to increasing hydroxyzine 25 mg 3 times daily to 50 mg 3 times daily.   1. Severe episode of recurrent major depressive disorder, with psychotic features ([facility])  Increase- ARIPiprazole (ABILIFY) 10 MG tablet; Take 1 tablet (10 mg total) by mouth daily.  Dispense: 30 tablet; Refill: 2 Continue- sertraline (ZOLOFT) 100 MG tablet; TAKE 1 TABLET (100 MG TOTAL) BY MOUTH DAILY.  Dispense: 30 tablet; Refill: 1 Continue- traZODone (DESYREL) 100 MG tablet; TAKE 1 TABLET (100 MG TOTAL) BY MOUTH AT BEDTIME.  Dispense: 30 tablet; Refill: 1  2. Generalized anxiety disorder  Continue- sertraline (ZOLOFT) 100 MG tablet; TAKE 1 TABLET (100 MG TOTAL) BY MOUTH DAILY.  Dispense: 30 tablet; Refill: 1 Increase- hydrOXYzine (ATARAX/VISTARIL) 50 MG tablet; Take 1 tablet (50 mg total) by mouth 3 (three) times daily as needed for anxiety.  Dispense: 90 tablet; Refill: 1  Follow-up in 3 months   Salley Slaughter, NP 04/14/2021, 12:33 PM

## 2021-05-05 ENCOUNTER — Ambulatory Visit (INDEPENDENT_AMBULATORY_CARE_PROVIDER_SITE_OTHER): Payer: Self-pay | Admitting: Internal Medicine

## 2021-05-05 ENCOUNTER — Other Ambulatory Visit: Payer: Self-pay

## 2021-05-05 VITALS — BP 132/80 | HR 106 | Temp 98.0°F | Resp 16 | Ht 75.0 in | Wt 272.8 lb

## 2021-05-05 DIAGNOSIS — E559 Vitamin D deficiency, unspecified: Secondary | ICD-10-CM

## 2021-05-05 DIAGNOSIS — D6859 Other primary thrombophilia: Secondary | ICD-10-CM

## 2021-05-05 DIAGNOSIS — I10 Essential (primary) hypertension: Secondary | ICD-10-CM

## 2021-05-05 DIAGNOSIS — E782 Mixed hyperlipidemia: Secondary | ICD-10-CM

## 2021-05-05 DIAGNOSIS — K8301 Primary sclerosing cholangitis: Secondary | ICD-10-CM

## 2021-05-05 DIAGNOSIS — E039 Hypothyroidism, unspecified: Secondary | ICD-10-CM

## 2021-05-05 DIAGNOSIS — Z79899 Other long term (current) drug therapy: Secondary | ICD-10-CM

## 2021-05-05 DIAGNOSIS — K55069 Acute infarction of intestine, part and extent unspecified: Secondary | ICD-10-CM

## 2021-05-05 DIAGNOSIS — R7309 Other abnormal glucose: Secondary | ICD-10-CM

## 2021-05-05 LAB — COMPLETE METABOLIC PANEL WITH GFR
AG Ratio: 1.6 (calc) (ref 1.0–2.5)
ALT: 39 U/L (ref 9–46)
AST: 28 U/L (ref 10–40)
Albumin: 4.9 g/dL (ref 3.6–5.1)
Alkaline phosphatase (APISO): 48 U/L (ref 36–130)
BUN: 11 mg/dL (ref 7–25)
CO2: 31 mmol/L (ref 20–32)
Calcium: 9.9 mg/dL (ref 8.6–10.3)
Chloride: 104 mmol/L (ref 98–110)
Creat: 1.09 mg/dL (ref 0.60–1.24)
Globulin: 3.1 g/dL (calc) (ref 1.9–3.7)
Glucose, Bld: 74 mg/dL (ref 65–99)
Potassium: 4.3 mmol/L (ref 3.5–5.3)
Sodium: 142 mmol/L (ref 135–146)
Total Bilirubin: 0.4 mg/dL (ref 0.2–1.2)
Total Protein: 8 g/dL (ref 6.1–8.1)
eGFR: 95 mL/min/{1.73_m2} (ref >=60)

## 2021-05-05 LAB — CBC WITH DIFFERENTIAL/PLATELET
Absolute Monocytes: 702 cells/uL (ref 200–950)
Basophils Absolute: 18 cells/uL (ref 0–200)
Basophils Relative: 0.3 %
Eosinophils Absolute: 47 cells/uL (ref 15–500)
Eosinophils Relative: 0.8 %
HCT: 41.7 % (ref 38.5–50.0)
Hemoglobin: 13.8 g/dL (ref 13.2–17.1)
Lymphs Abs: 1328 cells/uL (ref 850–3900)
MCH: 30.1 pg (ref 27.0–33.0)
MCHC: 33.1 g/dL (ref 32.0–36.0)
MCV: 91 fL (ref 80.0–100.0)
MPV: 10.8 fL (ref 7.5–12.5)
Monocytes Relative: 11.9 %
Neutro Abs: 3806 cells/uL (ref 1500–7800)
Neutrophils Relative %: 64.5 %
Platelets: 185 10*3/uL (ref 140–400)
RBC: 4.58 10*6/uL (ref 4.20–5.80)
RDW: 13.6 % (ref 11.0–15.0)
Total Lymphocyte: 22.5 %
WBC: 5.9 10*3/uL (ref 3.8–10.8)

## 2021-05-05 LAB — TSH: TSH: 98.31 mIU/L — ABNORMAL HIGH (ref 0.40–4.50)

## 2021-05-05 NOTE — Progress Notes (Signed)
Future Appointments  Date Time Provider [place]  05/05/2021  3:00 PM [name], MD [facility] None  06/03/2021 10:10 AM Armbruster, Carlota Raspberry, MD [facility]  07/14/2021 11:30 AM Salley Slaughter, NP [facility] None  05/08/2022  3:00 PM [name], MD [facility] None    History of Present Illness:       This very nice 30 y.o. single BM presents for 6 month follow up with HTN, HLD, Pre-Diabetes and Vitamin D Deficiency.           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 In Nov 2019, he had ERCP/Sphincterotomy by Dr Ardis Hughs and patient is followed also by Dr Havery Moros.                                                       Patient is unemployed and has a habit of chronic daily marijuana use and relates that his mother provides him money to purchase his marijuana.       Patient is treated for HTN & BP has been controlled at home. Today's BP is at goal -132/80. Patient has had no complaints of any cardiac type chest pain, palpitations, dyspnea / orthopnea / PND, dizziness, claudication, or dependent edema.       Hyperlipidemia is controlled with diet & meds. Patient denies myalgias or other med SE's. Last Lipids were not at goal:  Lab Results  Component Value Date   CHOL 234 (H) 05/05/2020   HDL 45 05/05/2020   LDLCALC 168 (H) 05/05/2020   TRIG 70 05/17/2020   CHOLHDL 5.2 (H) 05/05/2020     Also, the patient has history of PreDiabetes and has had no symptoms of reactive hypoglycemia, diabetic polys, paresthesias or visual blurring.  Last A1c was near goal:  Lab Results  Component Value Date   HGBA1C 5.7 (H) 05/05/2020                                                         Further, the patient also has history of Vitamin D Deficiency and  does not supplement vitamin D as repeatedly  recommended. Last vitamin D was still very low  (goal 70-100):   Lab Results  Component Value Date   VD25OH 32 05/05/2020     Current Outpatient Medications on File Prior to Visit  Medication Sig   amLODipine 2.5 MG tablet Take 1 tablet  daily.   ELIQUIS 5 MG TABS tablet TAKE 1 TABLET TWICE DAILY    ABILIFY 10 MG tablet Take 1 tablet daily.   bisoprolol-hctz 10-6.25  Take 1 tablet Daily for BP    VITAMIN D 5000 units Take daily   ezetimibe 10 MG tablet Take 1 tablet  Daily     hydrOXYzine  50 MG  Take 1 tablet 3 times daily as needed    omeprazole 40 MG capsule Take 1 capsule daily   sertraline  100 MG tablet TAKE 1 TABLET  DAILY.   Tadalafil 20 MG tablet  Take  1/2-1 tab  every 2-3 days  as needed    traZODone  100 MG  TAKE 1 TABLET AT BEDTIME.    Allergies  Allergen Reactions   Tylenol [Acetaminophen]  r/t elevated LFTs    PMHx:   Past Medical History:  Diagnosis Date   GERD (gastroesophageal reflux disease)    Graves disease 08/18/2015   Hyperlipidemia    Hypothyroid    Palpitations    PSC (primary sclerosing cholangitis)    Thrombosis    superior mesenteric vein - 2019   Vitamin D deficiency     Immunization History  Administered Date(s) Administered   PPD Test 12/06/2015   Td 10/16/2008    Past Surgical History:  Procedure Laterality Date   ANTERIOR CRUCIATE LIGAMENT REPAIR  2012   ERCP N/A 08/19/2018   Procedure: ENDOSCOPIC RETROGRADE CHOLANGIOPANCREATOGRAPHY (ERCP);  Surgeon: Milus Banister, MD;  Location: [facility];  Service: Endoscopy;  Laterality: N/A;   LAPAROSCOPIC APPENDECTOMY N/A 05/11/2020   Procedure: APPENDECTOMY LAPAROSCOPIC;  Surgeon: Johnathan Hausen, MD;  Location: [facility];  Service: General;  Laterality: N/A;   REMOVAL OF STONES  08/19/2018   Procedure: REMOVAL OF STONES;  Surgeon: Milus Banister, MD;  Location: [facility];  Service: Endoscopy;;   SPHINCTEROTOMY  08/19/2018   Procedure: Joan Mayans;  Surgeon: Milus Banister, MD;   Location: [facility];  Service: Endoscopy;;   WISDOM TOOTH EXTRACTION      FHx:    Reviewed / unchanged  SHx:    Reviewed / unchanged   Systems Review:  Constitutional: Denies fever, chills, wt changes, headaches, insomnia, fatigue, night sweats, change in appetite. Eyes: Denies redness, blurred vision, diplopia, discharge, itchy, watery eyes.  ENT: Denies discharge, congestion, post nasal drip, epistaxis, sore throat, earache, hearing loss, dental pain, tinnitus, vertigo, sinus pain, snoring.  CV: Denies chest pain, palpitations, irregular heartbeat, syncope, dyspnea, diaphoresis, orthopnea, PND, claudication or edema. Respiratory: denies cough, dyspnea, DOE, pleurisy, hoarseness, laryngitis, wheezing.  Gastrointestinal: Denies dysphagia, odynophagia, heartburn, reflux, water brash, abdominal pain or cramps, nausea, vomiting, bloating, diarrhea, constipation, hematemesis, melena, hematochezia  or hemorrhoids. Genitourinary: Denies dysuria, frequency, urgency, nocturia, hesitancy, discharge, hematuria or flank pain. Musculoskeletal: Denies arthralgias, myalgias, stiffness, jt. swelling, pain, limping or strain/sprain.  Skin: Denies pruritus, rash, hives, warts, acne, eczema or change in skin lesion(s). Neuro: No weakness, tremor, incoordination, spasms, paresthesia or pain. Psychiatric: Denies confusion, memory loss or sensory loss. Endo: Denies change in weight, skin or hair change.  Heme/Lymph: No excessive bleeding, bruising or enlarged lymph nodes.  Physical Exam  BP 132/80   Pulse (!) 106   Temp 98 F (36.7 C)   Resp 16   Ht 6\' 3"  (1.905 m)   Wt 272 lb 12.8 oz (123.7 kg)   SpO2 98%   BMI 34.10 kg/m   Appears  well nourished, well groomed  and in no distress.  Eyes: PERRLA, EOMs, conjunctiva no swelling or erythema. Sinuses: No frontal/maxillary tenderness ENT/Mouth: EAC's clear, TM's nl w/o erythema, bulging. Nares clear w/o erythema, swelling, exudates. Oropharynx  clear without erythema or exudates. Oral hygiene is good. Tongue normal, non obstructing. Hearing intact.  Neck: Supple. Thyroid not palpable. Car 2+/2+ without bruits, nodes or JVD. Chest: Respirations nl with BS clear & equal w/o rales, rhonchi, wheezing or stridor.  Cor: Heart sounds normal w/ regular rate and rhythm without sig. murmurs, gallops, clicks or rubs. Peripheral pulses normal and equal  without edema.  Abdomen: Soft & bowel sounds normal. Non-tender w/o  guarding, rebound, hernias, masses or organomegaly.  Lymphatics: Unremarkable.  Musculoskeletal: Full ROM all peripheral extremities, joint stability, 5/5 strength and normal gait.  Skin: Warm, dry without exposed rashes, lesions or ecchymosis apparent.  Neuro: Cranial nerves intact, reflexes equal bilaterally. Sensory-motor testing grossly intact. Tendon reflexes grossly intact.  Pysch: Alert & oriented x 3.  Insight and judgement nl & appropriate. No ideations.  Assessment and Plan:  1. Essential hypertension  - Continue medication, monitor blood pressure at home.  - Continue DASH diet.  Reminder to go to the ER if any CP,  SOB, nausea, dizziness, severe HA, changes vision/speech.   - CBC with Differential/Platelet - COMPLETE METABOLIC PANEL WITH GFR - TSH   2. Hyperlipidemia, mixed  - Continue diet/meds, exercise,& lifestyle modifications.  - Continue monitor periodic cholesterol/liver & renal functions   3. Abnormal glucose  - Continue diet, exercise  - Lifestyle modifications.  - Monitor appropriate labs  - COMPLETE METABOLIC PANEL WITH GFR  4. Vitamin D deficiency  - Continue supplementation.   5. Hypothyroidism  6. PSC (primary sclerosing cholangitis)  - COMPLETE METABOLIC PANEL WITH GFR  7. Protein S deficiency ([place])  - CBC with Differential/Platelet  8. Mesenteric vein thrombosis ([facility])  - CBC with Differential/Platelet  9. Medication management  - CBC with Differential/Platelet -  COMPLETE METABOLIC PANEL WITH GFR - TSH         Discussed  regular exercise, BP monitoring, weight control to achieve/maintain BMI less than 25 and discussed med and SE's. Recommended labs to assess and monitor clinical status with further disposition pending results of labs.  I discussed the assessment and treatment plan with the patient. The patient was provided an opportunity to ask questions and all were answered. The patient agreed with the plan and demonstrated an understanding of the instructions.  I provided over 30 minutes of exam, counseling, chart review and  complex critical decision making.        The patient was advised to call back or seek an in-person evaluation if the symptoms worsen or if the condition fails to improve as anticipated.   Kirtland Bouchard, MD

## 2021-05-05 NOTE — Patient Instructions (Signed)
Due to recent changes in healthcare laws, you may see the results of your imaging and laboratory studies on MyChart before your provider has had a chance to review them.  We understand that in some cases there may be results that are confusing or concerning to you. Not all laboratory results come back in the same time frame and the provider may be waiting for multiple results in order to interpret others.  Please give us 48 hours in order for your provider to thoroughly review all the results before contacting the office for clarification of your results.   +++++++++++++++++++++++++++++++++  Vit D  & Vit C 1,000 mg   are recommended to help protect  against the Covid-19 and other Corona viruses.    Also it's recommended  to take  Zinc 50 mg  to help  protect against the Covid-19   and best place to get  is also on [employer]  and don't pay more than 6-8 cents /pill !  ================================= Coronavirus (COVID-19) Are you at risk?  Are you at risk for the Coronavirus (COVID-19)?  To be considered HIGH RISK for Coronavirus (COVID-19), you have to meet the following criteria:  Traveled to [place], [place], [place], [place] or [place]; or in the [place] to [place], [place], [place]  or [place]; and have fever, cough, and shortness of breath within the last 2 weeks of travel OR Been in close contact with a person diagnosed with COVID-19 within the last 2 weeks and have  fever, cough,and shortness of breath  IF YOU DO NOT MEET THESE CRITERIA, YOU ARE CONSIDERED LOW RISK FOR COVID-19.  What to do if you are HIGH RISK for COVID-19?  If you are having a medical emergency, call 911. Seek medical care right away. Before you go to a doctor's office, urgent care or emergency department,  call ahead and tell them about your recent travel, contact with someone diagnosed with COVID-19   and your symptoms.  You should receive instructions from your physician's office  regarding next steps of care.  When you arrive at healthcare provider, tell the healthcare staff immediately you have returned from  visiting [place], [place], [place], [place] or [place]; or traveled in the [place] to [place], [place],  [place] or [place] in the last two weeks or you have been in close contact with a person diagnosed with  COVID-19 in the last 2 weeks.   Tell the health care staff about your symptoms: fever, cough and shortness of breath. After you have been seen by a medical provider, you will be either: Tested for (COVID-19) and discharged home on quarantine except to seek medical care if  symptoms worsen, and asked to  Stay home and avoid contact with others until you get your results (4-5 days)  Avoid travel on public transportation if possible (such as bus, train, or airplane) or Sent to the Emergency Department by EMS for evaluation, COVID-19 testing  and  possible admission depending on your condition and test results.  What to do if you are LOW RISK for COVID-19?  Reduce your risk of any infection by using the same precautions used for avoiding the common cold or flu:  Wash your hands often with soap and warm water for at least 20 seconds.  If soap and water are not readily available,  use an alcohol-based hand sanitizer with at least 60% alcohol.  If coughing or sneezing, cover your mouth and nose by coughing   or sneezing into the elbow areas of your shirt or coat,  into a tissue or into your sleeve (not your hands). Avoid shaking hands with others and consider head nods or verbal greetings only. Avoid touching your eyes, nose, or mouth with unwashed hands.  Avoid close contact with people who are sick. Avoid places or events with large numbers of people in one location, like concerts or sporting events. Carefully consider travel plans you have or are making. If you are planning any travel outside or inside the [place], visit the CDC's Travelers' Health  webpage for the latest health notices. If you have some symptoms but not all symptoms, continue to monitor at home and seek medical attention  if your symptoms worsen. If you are having a medical emergency, call 911. >>>>>>>>>>>>>>>>>>>>>>>> Preventive Care for Adults  A healthy lifestyle and preventive care can promote health and wellness. Preventive health guidelines for men include the following key practices: A routine yearly physical is a good way to check with your health care provider about your health and preventative screening. It is a chance to share any concerns and updates on your health and to receive a thorough exam. Visit your dentist for a routine exam and preventative care every 6 months. Brush your teeth twice a day and floss once a day. Good oral hygiene prevents tooth decay and gum disease. The frequency of eye exams is based on your age, health, family medical history, use of contact lenses, and other factors. Follow your health care provider's recommendations for frequency of eye exams. Eat a healthy diet. Foods such as vegetables, fruits, whole grains, low-fat dairy products, and lean protein foods contain the nutrients you need without too many calories. Decrease your intake of foods high in solid fats, added sugars, and salt. Eat the right amount of calories for you. Get information about a proper diet from your health care provider, if necessary. Regular physical exercise is one of the most important things you can do for your health. Most adults should get at least 150 minutes of moderate-intensity exercise (any activity that increases your heart rate and causes you to sweat) each week. In addition, most adults need muscle-strengthening exercises on 2 or more days a week. Maintain a healthy weight. The body mass index (BMI) is a screening tool to identify possible weight problems. It provides an estimate of body fat based on height and weight. Your health care provider can  find your BMI and can help you achieve or maintain a healthy weight. For adults 20 years and older: A BMI below 18.5 is considered underweight. A BMI of 18.5 to 24.9 is normal. A BMI of 25 to 29.9 is considered overweight. A BMI of 30 and above is considered obese. Maintain normal blood lipids and cholesterol levels by exercising and minimizing your intake of saturated fat. Eat a balanced diet with plenty of fruit and vegetables. Blood tests for lipids and cholesterol should begin at age 20 and be repeated every 5 years. If your lipid or cholesterol levels are high, you are over 50, or you are at high risk for heart disease, you may need your cholesterol levels checked more frequently. Ongoing high lipid and cholesterol levels should be treated with medicines if diet and exercise are not working. If you smoke, find out from your health care provider how to quit. If you do not use tobacco, do not start. Lung cancer screening is recommended for adults aged 55-80 years who are at high risk for   developing lung cancer because of a history of smoking. A yearly low-dose CT scan of the lungs is recommended for people who have at least a 30-pack-year history of smoking and are a current smoker or have quit within the past 15 years. A pack year of smoking is smoking an average of 1 pack of cigarettes a day for 1 year (for example: 1 pack a day for 30 years or 2 packs a day for 15 years). Yearly screening should continue until the smoker has stopped smoking for at least 15 years. Yearly screening should be stopped for people who develop a health problem that would prevent them from having lung cancer treatment. If you choose to drink alcohol, do not have more than 2 drinks per day. One drink is considered to be 12 ounces (355 mL) of beer, 5 ounces (148 mL) of wine, or 1.5 ounces (44 mL) of liquor. High blood pressure causes heart disease and increases the risk of stroke. Your blood pressure should be checked. Ongoing  high blood pressure should be treated with medicines, if weight loss and exercise are not effective. If you are 50-61 years old, ask your health care provider if you should take aspirin to prevent heart disease. Diabetes screening involves taking a blood sample to check your fasting blood sugar level. Testing should be considered at a younger age or be carried out more frequently if you are overweight and have at least 1 risk factor for diabetes. Colorectal cancer can be detected and often prevented. Most routine colorectal cancer screening begins at the age of 32 and continues through age 64. However, your health care provider may recommend screening at an earlier age if you have risk factors for colon cancer. On a yearly basis, your health care provider may provide home test kits to check for hidden blood in the stool. Use of a small camera at the end of a tube to directly examine the colon (sigmoidoscopy or colonoscopy) can detect the earliest forms of colorectal cancer. Talk to your health care provider about this at age 48, when routine screening begins. Direct exam of the colon should be repeated every 5-10 years through age 56, unless early forms of precancerous polyps or small growths are found. Screening for abdominal aortic aneurysm (AAA)  are recommended for persons over age 52 who have history of hypertensionor who are current or former smokers. Talk with your health care provider about prostate cancer screening. Testicular cancer screening is recommended for adult males. Screening includes self-exam, a health care provider exam, and other screening tests. Consult with your health care provider about any symptoms you have or any concerns you have about testicular cancer. Use sunscreen. Apply sunscreen liberally and repeatedly throughout the day. You should seek shade when your shadow is shorter than you. Protect yourself by wearing long sleeves, pants, a wide-brimmed hat, and sunglasses year  round, whenever you are outdoors. Once a month, do a whole-body skin exam, using a mirror to look at the skin on your back. Tell your health care provider about new moles, moles that have irregular borders, moles that are larger than a pencil eraser, or moles that have changed in shape or color. Stay current with required vaccines (immunizations). Influenza vaccine. All adults should be immunized every year. Tetanus, diphtheria, and acellular pertussis (Td, Tdap) vaccine. An adult who has not previously received Tdap or who does not know his vaccine status should receive 1 dose of Tdap. This initial dose should be followed by tetanus  and diphtheria toxoids (Td) booster doses every 10 years. Adults with an unknown or incomplete history of completing a 3-dose immunization series with Td-containing vaccines should begin or complete a primary immunization series including a Tdap dose. Adults should receive a Td booster every 10 years. Zoster vaccine. One dose is recommended for adults aged 84 years or older unless certain conditions are present.  Pneumococcal 13-valent conjugate (PCV13) vaccine. When indicated, a person who is uncertain of his immunization history and has no record of immunization should receive the PCV13 vaccine. An adult aged 18 years or older who has certain medical conditions and has not been previously immunized should receive 1 dose of PCV13 vaccine. This PCV13 should be followed with a dose of pneumococcal polysaccharide (PPSV23) vaccine. The PPSV23 vaccine dose should be obtained at least 8 weeks after the dose of PCV13 vaccine. An adult aged 36 years or older who has certain medical conditions and previously received 1 or more doses of PPSV23 vaccine should receive 1 dose of PCV13. The PCV13 vaccine dose should be obtained 1 or more years after the last PPSV23 vaccine dose.  Pneumococcal polysaccharide (PPSV23) vaccine. When PCV13 is also indicated, PCV13 should be obtained first. All  adults aged 40 years and older should be immunized. An adult younger than age 15 years who has certain medical conditions should be immunized. Any person who resides in a nursing home or long-term care facility should be immunized. An adult smoker should be immunized. People with an immunocompromised condition and certain other conditions should receive both PCV13 and PPSV23 vaccines. People with human immunodeficiency virus (HIV) infection should be immunized as soon as possible after diagnosis. Immunization during chemotherapy or radiation therapy should be avoided. Routine use of PPSV23 vaccine is not recommended for American Indians, [place], or people younger than 65 years unless there are medical conditions that require PPSV23 vaccine. When indicated, people who have unknown immunization and have no record of immunization should receive PPSV23 vaccine. One-time revaccination 5 years after the first dose of PPSV23 is recommended for people aged 19-64 years who have chronic kidney failure, nephrotic syndrome, asplenia, or immunocompromised conditions. People who received 1-2 doses of PPSV23 before age 30 years should receive another dose of PPSV23 vaccine at age 60 years or later if at least 5 years have passed since the previous dose. Doses of PPSV23 are not needed for people immunized with PPSV23 at or after age 48 years. Hepatitis A vaccine. Adults who wish to be protected from this disease, have certain high-risk conditions, work with hepatitis A-infected animals, work in hepatitis A research labs, or travel to or work in countries with a high rate of hepatitis A should be immunized. Adults who were previously unvaccinated and who anticipate close contact with an international adoptee during the first 60 days after arrival in the [place] from a country with a high rate of hepatitis A should be immunized. Hepatitis B vaccine. Adults should be immunized if they wish to be protected from this  disease, have certain high-risk conditions, may be exposed to blood or other infectious body fluids, are household contacts or sex partners of hepatitis B positive people, are clients or workers in certain care facilities, or travel to or work in countries with a high rate of hepatitis B.  Preventive Service / Frequency  Ages 6 to 1 Blood pressure check. Lipid and cholesterol check. Hepatitis C blood test.** / For any individual with known risks for hepatitis C. Skin self-exam. /  Monthly. Influenza vaccine. / Every year. Tetanus, diphtheria, and acellular pertussis (Tdap, Td) vaccine.** / Consult your health care provider. 1 dose of Td every 10 years. HPV vaccine. / 3 doses over 6 months, if 26 or younger. Measles, mumps, rubella (MMR) vaccine.** / You need at least 1 dose of MMR if you were born in 1957 or later. You may also need a second dose. Pneumococcal 13-valent conjugate (PCV13) vaccine.** / Consult your health care provider. Pneumococcal polysaccharide (PPSV23) vaccine.** / 1 to 2 doses if you smoke cigarettes or if you have certain conditions. Meningococcal vaccine.** / 1 dose if you are age 19 to 21 years and a first-year college student living in a residence hall, or have one of several medical conditions. You may also need additional booster doses. Hepatitis A vaccine.** / Consult your health care provider. Hepatitis B vaccine.** / Consult your health care provider. +++++++++ Recommend Adult Low Dose Aspirin or  coated  Aspirin 81 mg daily  To reduce risk of Colon Cancer 40 %,  Skin Cancer 26 % ,  Melanoma 46%  and  Pancreatic cancer 60% ++++++++++++++++++ Vitamin D goal  is between 70-100.  Please make sure that you are taking your Vitamin D as directed.  It is very important as a natural anti-inflammatory  helping hair, skin, and nails, as well as reducing stroke and heart attack risk.  It helps your bones and helps with mood. It also decreases numerous cancer risks  so please take it as directed.  Low Vit D is associated with a 200-300% higher risk for CANCER  and 200-300% higher risk for HEART   ATTACK  &  STROKE.   ...................................... It is also associated with higher death rate at younger ages,  autoimmune diseases like Rheumatoid arthritis, Lupus, Multiple Sclerosis.    Also many other serious conditions, like depression, Alzheimer's Dementia, infertility, muscle aches, fatigue, fibromyalgia - just to name a few. +++++++++++++++++++++ Recommend the book "The END of DIETING" by Dr Joel Fuhrman  & the book "The END of DIABETES " by Dr Joel Fuhrman At [employer] - get book & Audio CD's    Being diabetic has a  300% increased risk for heart attack, stroke, cancer, and alzheimer- type vascular dementia. It is very important that you work harder with diet by avoiding all foods that are white. Avoid white rice (brown & wild rice is OK), white potatoes (sweetpotatoes in moderation is OK), White bread or wheat bread or anything made out of white flour like bagels, donuts, rolls, buns, biscuits, cakes, pastries, cookies, pizza crust, and pasta (made from white flour & egg whites) - vegetarian pasta or spinach or wheat pasta is OK. Multigrain breads like Arnold's or Pepper[employer], or multigrain sandwich thins or flatbreads.  Diet, exercise and weight loss can reverse and cure diabetes in the early stages.  Diet, exercise and weight loss is very important in the control and prevention of complications of diabetes which affects every system in your body, ie. Brain - dementia/stroke, eyes - glaucoma/blindness, heart - heart attack/heart failure, kidneys - dialysis, stomach - gastric paralysis, intestines - malabsorption, nerves - severe painful neuritis, circulation - gangrene & loss of a leg(s), and finally cancer and Alzheimers.    I recommend avoid fried & greasy foods,  sweets/candy, white rice (brown or wild rice or Quinoa is OK), white potatoes  (sweet potatoes are OK) - anything made from white flour - bagels, doughnuts, rolls, buns, biscuits,white and wheat breads, pizza crust and   traditional pasta made of white flour & egg white(vegetarian pasta or spinach or wheat pasta is OK).  Multi-grain bread is OK - like multi-grain flat bread or sandwich thins. Avoid alcohol in excess. Exercise is also important.    Eat all the vegetables you want - avoid meat, especially red meat and dairy - especially cheese.  Cheese is the most concentrated form of trans-fats which is the worst thing to clog up our arteries. Veggie cheese is OK which can be found in the fresh produce section at [employer] or [employer] or [employer]  +++++++++++++++++++ DASH Eating Plan  DASH stands for "Dietary Approaches to Stop Hypertension."   The DASH eating plan is a healthy eating plan that has been shown to reduce high blood pressure (hypertension). Additional health benefits may include reducing the risk of type 2 diabetes mellitus, heart disease, and stroke. The DASH eating plan may also help with weight loss. WHAT DO I NEED TO KNOW ABOUT THE DASH EATING PLAN? For the DASH eating plan, you will follow these general guidelines: Choose foods with a percent daily value for sodium of less than 5% (as listed on the food label). Use salt-free seasonings or herbs instead of table salt or sea salt. Check with your health care provider or pharmacist before using salt substitutes. Eat lower-sodium products, often labeled as "lower sodium" or "no salt added." Eat fresh foods. Eat more vegetables, fruits, and low-fat dairy products. Choose whole grains. Look for the word "whole" as the first word in the ingredient list. Choose fish  Limit sweets, desserts, sugars, and sugary drinks. Choose heart-healthy fats. Eat veggie cheese  Eat more home-cooked food and less restaurant, buffet, and fast food. Limit fried foods. Cook foods using methods other than frying. Limit  canned vegetables. If you do use them, rinse them well to decrease the sodium. When eating at a restaurant, ask that your food be prepared with less salt, or no salt if possible.                      WHAT FOODS CAN I EAT? Read Dr Fara Olden Fuhrman's books on The End of Dieting & The End of Diabetes  Grains Whole grain or whole wheat bread. Brown rice. Whole grain or whole wheat pasta. Quinoa, bulgur, and whole grain cereals. Low-sodium cereals. Corn or whole wheat flour tortillas. Whole grain cornbread. Whole grain crackers. Low-sodium crackers.  Vegetables Fresh or frozen vegetables (raw, steamed, roasted, or grilled). Low-sodium or reduced-sodium tomato and vegetable juices. Low-sodium or reduced-sodium tomato sauce and paste. Low-sodium or reduced-sodium canned vegetables.   Fruits All fresh, canned (in natural juice), or frozen fruits.  Protein Products  All fish and seafood.  Dried beans, peas, or lentils. Unsalted nuts and seeds. Unsalted canned beans.  Dairy Low-fat dairy products, such as skim or 1% milk, 2% or reduced-fat cheeses, low-fat ricotta or cottage cheese, or plain low-fat yogurt. Low-sodium or reduced-sodium cheeses.  Fats and Oils Tub margarines without trans fats. Light or reduced-fat mayonnaise and salad dressings (reduced sodium). Avocado. Safflower, olive, or canola oils. Natural peanut or almond butter.  Other Unsalted popcorn and pretzels. The items listed above may not be a complete list of recommended foods or beverages. Contact your dietitian for more options.  +++++++++++++++++++  WHAT FOODS ARE NOT RECOMMENDED? Grains/ White flour or wheat flour White bread. White pasta. White rice. Refined cornbread. Bagels and croissants. Crackers that contain trans fat.  Vegetables  Creamed or fried vegetables. Vegetables  in a . Regular canned vegetables. Regular canned tomato sauce and paste. Regular tomato and vegetable juices.  Fruits Dried fruits. Canned fruit  in light or heavy syrup. Fruit juice.  Meat and Other Protein Products Meat in general - RED meat & White meat.  Fatty cuts of meat. Ribs, chicken wings, all processed meats as bacon, sausage, bologna, salami, fatback, hot dogs, bratwurst and packaged luncheon meats.  Dairy Whole or 2% milk, cream, half-and-half, and cream cheese. Whole-fat or sweetened yogurt. Full-fat cheeses or blue cheese. Non-dairy creamers and whipped toppings. Processed cheese, cheese spreads, or cheese curds.  Condiments Onion and garlic salt, seasoned salt, table salt, and sea salt. Canned and packaged gravies. Worcestershire sauce. Tartar sauce. Barbecue sauce. Teriyaki sauce. Soy sauce, including reduced sodium. Steak sauce. Fish sauce. Oyster sauce. Cocktail sauce. Horseradish. Ketchup and mustard. Meat flavorings and tenderizers. Bouillon cubes. Hot sauce. Tabasco sauce. Marinades. Taco seasonings. Relishes.  Fats and Oils Butter, stick margarine, lard, shortening and bacon fat. Coconut, palm kernel, or palm oils. Regular salad dressings.  Pickles and olives. Salted popcorn and pretzels.  The items listed above may not be a complete list of foods and beverages to avoid.   

## 2021-05-06 ENCOUNTER — Other Ambulatory Visit: Payer: Self-pay | Admitting: Internal Medicine

## 2021-05-06 DIAGNOSIS — E039 Hypothyroidism, unspecified: Secondary | ICD-10-CM

## 2021-05-06 MED ORDER — LEVOTHYROXINE SODIUM 100 MCG PO TABS: ORAL_TABLET | ORAL | 3 refills | Status: DC

## 2021-05-06 NOTE — Progress Notes (Signed)
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   - VERY IMPORTANT THAT RESTART THYROID MEDICINE !  - Sent in [facility] Rx to [facility] to start TODAY !   ==============================================================   - Recommend OV in 1 month to recheck Thyroid level in blood - Very Important ! ==============================================================

## 2021-05-08 ENCOUNTER — Encounter: Payer: Self-pay | Admitting: Internal Medicine

## 2021-06-03 ENCOUNTER — Ambulatory Visit: Payer: Self-pay | Admitting: Gastroenterology

## 2021-06-16 ENCOUNTER — Other Ambulatory Visit: Payer: Self-pay

## 2021-06-16 ENCOUNTER — Other Ambulatory Visit: Payer: Self-pay | Admitting: Internal Medicine

## 2021-06-16 DIAGNOSIS — E0500 Thyrotoxicosis with diffuse goiter without thyrotoxic crisis or storm: Secondary | ICD-10-CM

## 2021-07-14 ENCOUNTER — Encounter (HOSPITAL_COMMUNITY): Payer: Self-pay | Admitting: Psychiatry

## 2021-07-14 ENCOUNTER — Other Ambulatory Visit: Payer: Self-pay

## 2021-07-14 ENCOUNTER — Telehealth (INDEPENDENT_AMBULATORY_CARE_PROVIDER_SITE_OTHER): Payer: No Payment, Other | Admitting: Psychiatry

## 2021-07-14 DIAGNOSIS — F411 Generalized anxiety disorder: Secondary | ICD-10-CM | POA: Diagnosis not present

## 2021-07-14 DIAGNOSIS — F1994 Other psychoactive substance use, unspecified with psychoactive substance-induced mood disorder: Secondary | ICD-10-CM | POA: Diagnosis not present

## 2021-07-14 MED ORDER — ARIPIPRAZOLE 10 MG PO TABS: 10.0000 mg | ORAL_TABLET | Freq: Every day | ORAL | 3 refills | Status: DC

## 2021-07-14 MED ORDER — TRAZODONE HCL 100 MG PO TABS: ORAL_TABLET | Freq: Every day | ORAL | 3 refills | Status: DC

## 2021-07-14 MED ORDER — SERTRALINE HCL 100 MG PO TABS: 150.0000 mg | ORAL_TABLET | Freq: Every day | ORAL | 3 refills | Status: DC

## 2021-07-14 NOTE — Progress Notes (Signed)
[place] MD/PA/NP OP Progress Note Virtual Visit via Telephone Note  I connected with Kyle Gonzales on 07/14/21 at 11:30 AM EDT by telephone and verified that I am speaking with the correct person using two identifiers.  Location: Patient: home Provider: Clinic   I discussed the limitations, risks, security and privacy concerns of performing an evaluation and management service by telephone and the availability of in person appointments. I also discussed with the patient that there may be a patient responsible charge related to this service. The patient expressed understanding and agreed to proceed.   I provided 30 minutes of non-face-to-face time during this encounter.    07/14/2021 11:56 AM Lowella Dandy Wister Hoefle  MRN:  [id number]  Chief Complaint: "I have been alright. I'm just trying to stay positive"  HPI: 30 year old male seen today for follow-up psychiatric evaluation.   He has a psychiatric history of depression, anxiety, and marijuana use.  He is currently managed on Abilify 10 mg daily, trazodone 100 mg nightly, and Zoloft 100 mg daily.  He notes his medications are somewhat effective in managing his psychiatric conditions.    Today patient was unable to logon virtually so his assessment was done over the phone.    During exam he was pleasant, cooperative, and engaged in conversation.  He informed Probation officer that he has been doing alright and notes that he is trying to stay positive.  He informed Probation officer that he continues to be anxious and depressed most days and notes that he utilizes marijuana to help cope with the symptoms.  Provider informed patient that marijuana can worsen his mental health condition.  He endorsed understanding.  Provider conducted a GAD-7 and patient quite a 17, at his last visit he scored 21.  Provider also conducted PHQ-9 and patient scored an 18, at his last visit he scored a 23.  He notes that he is gained 30 pounds since his last visit and reports increased  appetite.  He endorses adequate sleep.  He endorses passive SI but denies wanting to harm himself today.  Today he denies SI/HI/VAH.  He notes that at times she is irritable and notes that he snaps on his family members.  He also endorses racing thoughts, distractibility, and fluctuations in mood.    Patient notes that he has Graves' disease and is tired most days.  Patient's weight gain could also be contributed to his Graves' disease.  Today he is agreeable to increasing Zoloft 100 mg to 150 mg to help manage anxiety and depression.  Patient notes that he does not take his medications regularly.  Provider instructed patient to take these medications regularly so that his mood will improve.  He endorsed understanding and agreed.  He will continue all other medications as prescribed.  No other concerns at this time.   Visit Diagnosis:    ICD-10-CM   1. Substance induced mood disorder (HCC)  F19.94 ARIPiprazole (ABILIFY) 10 MG tablet    sertraline (ZOLOFT) 100 MG tablet    traZODone (DESYREL) 100 MG tablet    2. Generalized anxiety disorder  F41.1 sertraline (ZOLOFT) 100 MG tablet      Past Psychiatric History: depression, anxiety, and marijuana use Past Medical History:  Past Medical History:  Diagnosis Date   GERD (gastroesophageal reflux disease)    Graves disease 08/18/2015   Hyperlipidemia    Hypothyroid    Palpitations    PSC (primary sclerosing cholangitis)    Thrombosis    superior mesenteric vein - 2019  Vitamin D deficiency     Past Surgical History:  Procedure Laterality Date   ANTERIOR CRUCIATE LIGAMENT REPAIR  2012   ERCP N/A 08/19/2018   Procedure: ENDOSCOPIC RETROGRADE CHOLANGIOPANCREATOGRAPHY (ERCP);  Surgeon: Milus Banister, MD;  Location: [facility];  Service: Endoscopy;  Laterality: N/A;   LAPAROSCOPIC APPENDECTOMY N/A 05/11/2020   Procedure: APPENDECTOMY LAPAROSCOPIC;  Surgeon: Johnathan Hausen, MD;  Location: [facility];  Service: General;  Laterality: N/A;    REMOVAL OF STONES  08/19/2018   Procedure: REMOVAL OF STONES;  Surgeon: Milus Banister, MD;  Location: [facility];  Service: Endoscopy;;   SPHINCTEROTOMY  08/19/2018   Procedure: Joan Mayans;  Surgeon: Milus Banister, MD;  [facility];  Service: Endoscopy;;   WISDOM TOOTH EXTRACTION      Family Psychiatric History: Denies  Family History:  Family History  Problem Relation Age of Onset   Diabetes Mother    Colon cancer Maternal Grandmother    Stomach cancer Maternal Grandmother    Diabetes Maternal Grandmother    Stomach cancer Maternal Grandfather    Diabetes Maternal Grandfather     Social History:  Social History   Socioeconomic History   Marital status: Single    Spouse name: Not on file   Number of children: Not on file   Years of education: Not on file   Highest education level: Not on file  Occupational History   Not on file  Tobacco Use   Smoking status: Some Days   Smokeless tobacco: Never   Tobacco comments:    Marijuana  Vaping Use   Vaping Use: Former  Substance and Sexual Activity   Alcohol use: No   Drug use: Yes    Types: Marijuana    Comment: Last  used yesterday   Sexual activity: Yes  Other Topics Concern   Not on file  Social History Narrative   Not on file   Social Determinants of Health   Financial Resource Strain: Not on file  Food Insecurity: Not on file  Transportation Needs: Not on file  Physical Activity: Not on file  Stress: Not on file  Social Connections: Not on file    Allergies:  Allergies  Allergen Reactions   Tylenol [Acetaminophen] Other (See Comments)    r/t elevated LFTs     Metabolic Disorder Labs: Lab Results  Component Value Date   HGBA1C 5.7 (H) 05/05/2020   MPG 117 05/05/2020   MPG 111 02/04/2018   No results found for: PROLACTIN Lab Results  Component Value Date   CHOL 234 (H) 05/05/2020   TRIG 70 05/17/2020   HDL 45 05/05/2020   CHOLHDL 5.2 (H) 05/05/2020   VLDL 21 04/09/2017    LDLCALC 168 (H) 05/05/2020   LDLCALC 110 (H) 06/05/2018   Lab Results  Component Value Date   TSH 98.31 (H) 05/05/2021   TSH 9.45 (H) 05/05/2020    Therapeutic Level Labs: No results found for: LITHIUM No results found for: VALPROATE No components found for:  CBMZ  Current Medications: Current Outpatient Medications  Medication Sig Dispense Refill   amLODipine (NORVASC) 2.5 MG tablet Take 1 tablet (2.5 mg total) by mouth daily. 30 tablet 1   apixaban (ELIQUIS) 5 MG TABS tablet TAKE 1 TABLET(5 MG) BY MOUTH TWICE DAILY (Patient taking differently: Take 5 mg by mouth 2 (two) times daily.) 180 tablet 0   ARIPiprazole (ABILIFY) 10 MG tablet Take 1 tablet (10 mg total) by mouth daily. 30 tablet 3   bisoprolol-hydrochlorothiazide [facility])  10-6.25 MG tablet LAST REFILL - Take 1 tablet Daily for BP - ABSOLUTELY  Must have Office Visit before Refill 7 tablet 0   Cholecalciferol (VITAMIN D3) 5000 units CAPS Take 1 capsule (5,000 Units total) by mouth every other day. (Patient taking differently: Take 5,000 Units by mouth daily.)     ezetimibe (ZETIA) 10 MG tablet Take      1 tablet       Daily        for Cholesterol    - Must have Office Visit before Refill 14 tablet 0   hydrOXYzine (ATARAX/VISTARIL) 50 MG tablet Take 1 tablet (50 mg total) by mouth 3 (three) times daily as needed for anxiety. 90 tablet 1   levothyroxine (SYNTHROID) 100 MCG tablet Take 1 tablet   Daily   on an empty stomach with only water for 30 minutes & no Antacid meds, Calcium or Magnesium for 4 hours & avoid Biotin 90 tablet 3   omeprazole (PRILOSEC) 40 MG capsule Take 1 capsule (40 mg total) by mouth daily. Please keep your August appointment for further refills 30 capsule 1   sertraline (ZOLOFT) 100 MG tablet Take 1.5 tablets (150 mg total) by mouth daily. 45 tablet 3   tadalafil (CIALIS) 20 MG tablet Take  1/2 to 1 tablet  every 2 to 3 days  as needed for XXXX 30 tablet 0   traZODone (DESYREL) 100 MG tablet TAKE 1 TABLET  (100 MG TOTAL) BY MOUTH AT BEDTIME. 30 tablet 3   No current facility-administered medications for this visit.     Musculoskeletal: Strength & Muscle Tone:  Unable to assess due to telephone visit Gait & Station:  Unable to assess due to telephone visit Patient leans: N/A  Psychiatric Specialty Exam: Review of Systems  There were no vitals taken for this visit.There is no height or weight on file to calculate BMI.  General Appearance:  Unable to assess due to telephone visit  Eye Contact:   Unable to assess due to telephone visit  Speech:  Clear and Coherent and Normal Rate  Volume:  Normal  Mood:  Anxious and Depressed  Affect:  Appropriate and Congruent  Thought Process:  Coherent, Goal Directed, and Linear  Orientation:  Full (Time, Place, and Person)  Thought Content: WDL and Logical   Suicidal Thoughts:  Yes.  without intent/plan  Homicidal Thoughts:  No  Memory:  Immediate;   Good Recent;   Good Remote;   Good  Judgement:  Good  Insight:  Good  Psychomotor Activity:  Normal  Concentration:  Concentration: Good and Attention Span: Good  Recall:  Good  Fund of Knowledge: Good  Language: Good  Akathisia:   Unable to assess due to telephone visit  Handed:  Right  AIMS (if indicated): not done  Assets:  Communication Skills Desire for Improvement Financial Resources/Insurance Housing Physical Health Social Support  ADL's:  Intact  Cognition: WNL  Sleep:  Good   Screenings: AIMS    Flowsheet Row Admission (Discharged) from 02/26/2018 in [place] [facility]  AIMS Total Score 0      AUDIT    Flowsheet Row Admission (Discharged) from 02/26/2018 in [place] [facility]  Alcohol Use Disorder Identification Test Final Score (AUDIT) 0      GAD-7    Flowsheet Row Video Visit from 07/14/2021 in [facility] Video Visit from 04/14/2021 in [facility] Office  Visit from 10/04/2020 in  [facility]  Total GAD-7 Score 17 21 21       PHQ2-9    Flowsheet Row Video Visit from 07/14/2021 in [facility] Office Visit from 05/05/2021 in [place] [facility] Video Visit from 04/14/2021 in [facility] Office Visit from 10/04/2020 in [facility] Office Visit from 05/05/2020 in [place] [facility]  PHQ-2 Total Score 6 0 6 6 2   PHQ-9 Total Score 18 -- 23 21 10       Flowsheet Row Video Visit from 07/14/2021 in [facility] Video Visit from 04/14/2021 in [place] Error: Q7 should not be populated when Q6 is No Error: Q7 should not be populated when Q6 is No        Assessment and Plan: Patient endorses symptoms of anxiety and depression.  He notes that he has not been taking his medications regularly.  Provider instructed patient to take his medications regularly.  Patient notes that he would like adjustment in his medications and is agreeable to increasing Zoloft 100 mg to 150 mg.  He will continue all other medications as prescribed.   1.Substance induced mood disorder  Continue- ARIPiprazole (ABILIFY) 10 MG tablet; Take 1 tablet (10 mg total) by mouth daily.  Dispense: 30 tablet; Refill: 3 Increased- sertraline (ZOLOFT) 100 MG tablet; Take 1.5 tablets (150 mg total) by mouth daily.  Dispense: 45 tablet; Refill: 3 Continue- traZODone (DESYREL) 100 MG tablet; TAKE 1 TABLET (100 MG TOTAL) BY MOUTH AT BEDTIME.  Dispense: 30 tablet; Refill: 3  2. Generalized anxiety disorder  Increased- sertraline (ZOLOFT) 100 MG tablet; Take 1.5 tablets (150 mg total) by mouth daily.  Dispense: 45 tablet; Refill: 3  Follow-up in 3 months    Salley Slaughter, NP 07/14/2021, 11:56 AM

## 2021-07-20 ENCOUNTER — Other Ambulatory Visit: Payer: Self-pay | Admitting: Gastroenterology

## 2021-07-20 DIAGNOSIS — K219 Gastro-esophageal reflux disease without esophagitis: Secondary | ICD-10-CM

## 2021-07-26 ENCOUNTER — Ambulatory Visit: Payer: Self-pay | Admitting: Gastroenterology

## 2021-07-26 ENCOUNTER — Telehealth: Payer: Self-pay | Admitting: Gastroenterology

## 2021-07-26 DIAGNOSIS — K219 Gastro-esophageal reflux disease without esophagitis: Secondary | ICD-10-CM

## 2021-07-26 MED ORDER — OMEPRAZOLE 40 MG PO CPDR
40.0000 mg | DELAYED_RELEASE_CAPSULE | Freq: Every day | ORAL | 1 refills | Status: DC
Start: 2021-07-26 — End: 2021-12-02

## 2021-07-26 NOTE — Telephone Encounter (Signed)
Refill of omeprazole 40 mg once daily sent to pharmacy.

## 2021-07-26 NOTE — Telephone Encounter (Signed)
Pt's mother called and cancelled pt's appointment for today stating patient is not feeling well and rescheduled appointment until 11/28.  Mother states patient is out of his reflux medication and asks if refills can be called in until his upcoming appointment.  Thank you.

## 2021-09-12 ENCOUNTER — Ambulatory Visit: Payer: Self-pay | Admitting: Gastroenterology

## 2021-09-14 ENCOUNTER — Ambulatory Visit: Payer: Self-pay | Admitting: Gastroenterology

## 2021-09-21 ENCOUNTER — Other Ambulatory Visit: Payer: Self-pay | Admitting: Nurse Practitioner

## 2021-09-21 DIAGNOSIS — K8301 Primary sclerosing cholangitis: Secondary | ICD-10-CM

## 2021-10-05 ENCOUNTER — Encounter (HOSPITAL_COMMUNITY): Payer: Self-pay | Admitting: Psychiatry

## 2021-10-05 ENCOUNTER — Telehealth (INDEPENDENT_AMBULATORY_CARE_PROVIDER_SITE_OTHER): Payer: No Payment, Other | Admitting: Psychiatry

## 2021-10-05 DIAGNOSIS — F411 Generalized anxiety disorder: Secondary | ICD-10-CM | POA: Diagnosis not present

## 2021-10-05 DIAGNOSIS — F1994 Other psychoactive substance use, unspecified with psychoactive substance-induced mood disorder: Secondary | ICD-10-CM

## 2021-10-05 MED ORDER — SERTRALINE HCL 100 MG PO TABS: 150.0000 mg | ORAL_TABLET | Freq: Every day | ORAL | 3 refills | Status: DC

## 2021-10-05 MED ORDER — ARIPIPRAZOLE 10 MG PO TABS
10.0000 mg | ORAL_TABLET | Freq: Every day | ORAL | 3 refills | Status: DC
Start: 2021-10-05 — End: 2022-10-04

## 2021-10-05 MED ORDER — TRAZODONE HCL 100 MG PO TABS: ORAL_TABLET | Freq: Every day | ORAL | 3 refills | Status: DC

## 2021-10-05 NOTE — Progress Notes (Signed)
[place] MD/PA/NP OP Progress Note Virtual Visit via Telephone Note  I connected with Kyle Gonzales on 10/05/21 at  3:30 PM EST by telephone and verified that I am speaking with the correct person using two identifiers.  Location: Patient: home Provider: Clinic   I discussed the limitations, risks, security and privacy concerns of performing an evaluation and management service by telephone and the availability of in person appointments. I also discussed with the patient that there may be a patient responsible charge related to this service. The patient expressed understanding and agreed to proceed.   I provided 30 minutes of non-face-to-face time during this encounter.    10/05/2021 11:36 AM Kyle Gonzales  MRN:  [id number]  Chief Complaint: "Honest I only take my Zoloft a couple days a week"  HPI: 30 year old male seen today for follow-up psychiatric evaluation.   He has a psychiatric history of depression, anxiety, and marijuana use.  He is currently managed on Abilify 10 mg daily, trazodone 100 mg nightly, and Zoloft 150 mg daily.  He notes his medications are somewhat effective in managing his psychiatric conditions.    Today patient was unable to logon virtually so his assessment was done over the phone.    During exam he was pleasant, cooperative, and engaged in conversation.  He informed Probation officer that he takes his Zoloft a couple times a week.  He informed Probation officer that he does smoke marijuana daily to help manage his psychiatric conditions.  Provider informed patient that marijuana can exacerbate his mental health conditions and encouraged him to reduce her discontinue his consumption.  He endorsed understanding and agreed.  Provider conducted a GAD-7 and patient scored an 18, at his last visit he scored a 17.  He notes that he is worried about his future and his current state of life.  He notes that at 7 he did not believe he would be where he is.  Provider also conducted PHQ-9 he  scored a 14, at his last visit he scored 18.  He endorses adequate sleep and reports that his appetite fluctuates.  He notes that he is gained approximately 25 to 30 pounds since his last visit.  At times he notes he is irritable, distractible, and has racing thoughts.  He denies SI/HI/VAH or paranoia.   Today patient notes that he does not take his medications regularly.  Provider instructed patient to take these medications regularly so that his mood will improve.  Provider also instructed patient to reduce/discontinue his marijuana consumption to improve his mood.  He endorsed understanding and agreed.  No medication changes made today.  He will continue all other medications as prescribed.  No other concerns at this time.   Visit Diagnosis:    ICD-10-CM   1. Generalized anxiety disorder  F41.1 sertraline (ZOLOFT) 100 MG tablet    2. Substance induced mood disorder (HCC)  F19.94 sertraline (ZOLOFT) 100 MG tablet    traZODone (DESYREL) 100 MG tablet    ARIPiprazole (ABILIFY) 10 MG tablet      Past Psychiatric History: depression, anxiety, and marijuana use Past Medical History:  Past Medical History:  Diagnosis Date   GERD (gastroesophageal reflux disease)    Graves disease 08/18/2015   Hyperlipidemia    Hypothyroid    Palpitations    PSC (primary sclerosing cholangitis)    Thrombosis    superior mesenteric vein - 2019   Vitamin D deficiency     Past Surgical History:  Procedure Laterality Date   ANTERIOR  CRUCIATE LIGAMENT REPAIR  2012   ERCP N/A 08/19/2018   Procedure: ENDOSCOPIC RETROGRADE CHOLANGIOPANCREATOGRAPHY (ERCP);  Surgeon: Milus Banister, MD;  Location: [facility];  Service: Endoscopy;  Laterality: N/A;   LAPAROSCOPIC APPENDECTOMY N/A 05/11/2020   Procedure: APPENDECTOMY LAPAROSCOPIC;  Surgeon: Johnathan Hausen, MD;  Location: [facility];  Service: General;  Laterality: N/A;   REMOVAL OF STONES  08/19/2018   Procedure: REMOVAL OF STONES;  Surgeon: Milus Banister, MD;   Location: [facility];  Service: Endoscopy;;   SPHINCTEROTOMY  08/19/2018   Procedure: Joan Mayans;  Surgeon: Milus Banister, MD;  Location: [facility];  Service: Endoscopy;;   WISDOM TOOTH EXTRACTION      Family Psychiatric History: Denies  Family History:  Family History  Problem Relation Age of Onset   Diabetes Mother    Colon cancer Maternal Grandmother    Stomach cancer Maternal Grandmother    Diabetes Maternal Grandmother    Stomach cancer Maternal Grandfather    Diabetes Maternal Grandfather     Social History:  Social History   Socioeconomic History   Marital status: Single    Spouse name: Not on file   Number of children: Not on file   Years of education: Not on file   Highest education level: Not on file  Occupational History   Not on file  Tobacco Use   Smoking status: Some Days   Smokeless tobacco: Never   Tobacco comments:    Marijuana  Vaping Use   Vaping Use: Former  Substance and Sexual Activity   Alcohol use: No   Drug use: Yes    Types: Marijuana    Comment: Last  used yesterday   Sexual activity: Yes  Other Topics Concern   Not on file  Social History Narrative   Not on file   Social Determinants of Health   Financial Resource Strain: Not on file  Food Insecurity: Not on file  Transportation Needs: Not on file  Physical Activity: Not on file  Stress: Not on file  Social Connections: Not on file    Allergies:  Allergies  Allergen Reactions   Tylenol [Acetaminophen] Other (See Comments)    r/t elevated LFTs     Metabolic Disorder Labs: Lab Results  Component Value Date   HGBA1C 5.7 (H) 05/05/2020   MPG 117 05/05/2020   MPG 111 02/04/2018   No results found for: PROLACTIN Lab Results  Component Value Date   CHOL 234 (H) 05/05/2020   TRIG 70 05/17/2020   HDL 45 05/05/2020   CHOLHDL 5.2 (H) 05/05/2020   VLDL 21 04/09/2017   LDLCALC 168 (H) 05/05/2020   LDLCALC 110 (H) 06/05/2018   Lab Results  Component Value  Date   TSH 98.31 (H) 05/05/2021   TSH 9.45 (H) 05/05/2020    Therapeutic Level Labs: No results found for: LITHIUM No results found for: VALPROATE No components found for:  CBMZ  Current Medications: Current Outpatient Medications  Medication Sig Dispense Refill   amLODipine (NORVASC) 2.5 MG tablet Take 1 tablet (2.5 mg total) by mouth daily. 30 tablet 1   apixaban (ELIQUIS) 5 MG TABS tablet TAKE 1 TABLET(5 MG) BY MOUTH TWICE DAILY (Patient taking differently: Take 5 mg by mouth 2 (two) times daily.) 180 tablet 0   ARIPiprazole (ABILIFY) 10 MG tablet Take 1 tablet (10 mg total) by mouth daily. 30 tablet 3   bisoprolol-hydrochlorothiazide (ZIAC) 10-6.25 MG tablet LAST REFILL - Take 1 tablet Daily for BP - ABSOLUTELY  Must have  Office Visit before Refill 7 tablet 0   Cholecalciferol (VITAMIN D3) 5000 units CAPS Take 1 capsule (5,000 Units total) by mouth every other day. (Patient taking differently: Take 5,000 Units by mouth daily.)     ezetimibe (ZETIA) 10 MG tablet Take      1 tablet       Daily        for Cholesterol    - Must have Office Visit before Refill 14 tablet 0   hydrOXYzine (ATARAX/VISTARIL) 50 MG tablet Take 1 tablet (50 mg total) by mouth 3 (three) times daily as needed for anxiety. 90 tablet 1   levothyroxine (SYNTHROID) 100 MCG tablet Take 1 tablet   Daily   on an empty stomach with only water for 30 minutes & no Antacid meds, Calcium or Magnesium for 4 hours & avoid Biotin 90 tablet 3   omeprazole (PRILOSEC) 40 MG capsule Take 1 capsule (40 mg total) by mouth daily. Please keep your November appointment for further refills. Thank you 30 capsule 1   sertraline (ZOLOFT) 100 MG tablet Take 1.5 tablets (150 mg total) by mouth daily. 45 tablet 3   tadalafil (CIALIS) 20 MG tablet Take  1/2 to 1 tablet  every 2 to 3 days  as needed for XXXX 30 tablet 0   traZODone (DESYREL) 100 MG tablet TAKE 1 TABLET (100 MG TOTAL) BY MOUTH AT BEDTIME. 30 tablet 3   No current  facility-administered medications for this visit.     Musculoskeletal: Strength & Muscle Tone:  Unable to assess due to telephone visit Gait & Station:  Unable to assess due to telephone visit Patient leans: N/A  Psychiatric Specialty Exam: Review of Systems  There were no vitals taken for this visit.There is no height or weight on file to calculate BMI.  General Appearance:  Unable to assess due to telephone visit  Eye Contact:   Unable to assess due to telephone visit  Speech:  Clear and Coherent and Normal Rate  Volume:  Normal  Mood:  Anxious and Depressed  Affect:  Appropriate and Congruent  Thought Process:  Coherent, Goal Directed, and Linear  Orientation:  Full (Time, Place, and Person)  Thought Content: WDL and Logical   Suicidal Thoughts:  Yes.  without intent/plan  Homicidal Thoughts:  No  Memory:  Immediate;   Good Recent;   Good Remote;   Good  Judgement:  Good  Insight:  Good  Psychomotor Activity:  Normal  Concentration:  Concentration: Good and Attention Span: Good  Recall:  Good  Fund of Knowledge: Good  Language: Good  Akathisia:   Unable to assess due to telephone visit  Handed:  Right  AIMS (if indicated): not done  Assets:  Communication Skills Desire for Improvement Financial Resources/Insurance Housing Physical Health Social Support  ADL's:  Intact  Cognition: WNL  Sleep:  Good   Screenings: AIMS    Flowsheet Row Admission (Discharged) from 02/26/2018 in [place] [facility]  AIMS Total Score 0      AUDIT    Flowsheet Row Admission (Discharged) from 02/26/2018 in [place] [facility]  Alcohol Use Disorder Identification Test Final Score (AUDIT) 0      GAD-7    Flowsheet Row Video Visit from 10/05/2021 in [facility] Video Visit from 07/14/2021 in [facility] Video Visit from 04/14/2021 in [facility]  Office Visit from 10/04/2020 in [facility]  Total GAD-7 Score 18 17 21 21       PHQ2-9    Flowsheet Row Video Visit from 10/05/2021 in [facility] Video Visit from 07/14/2021 in [facility] Office Visit from 05/05/2021 in [place] [facility] Video Visit from 04/14/2021 in [facility] Office Visit from 10/04/2020 in [facility]  PHQ-2 Total Score 3 6 0 6 6  PHQ-9 Total Score 14 18 -- 23 21      Flowsheet Row Video Visit from 07/14/2021 in [facility] Video Visit from 04/14/2021 in [place] Error: Q7 should not be populated when Q6 is No Error: Q7 should not be populated when Q6 is No        Assessment and Plan: Patient endorses symptoms of substance use (marijuana), anxiety, and depression.  He notes that he has not been taking his medications regularly.  Provider instructed patient to take his medications regularly and reduce/discontinue his marijuana consumption.  He endorsed understanding and agreed.  At this time medications not adjusted.  Patient will continue on medications as prescribed. 1.Substance induced mood disorder  Continue- ARIPiprazole (ABILIFY) 10 MG tablet; Take 1 tablet (10 mg total) by mouth daily.  Dispense: 30 tablet; Refill: 3 Continue- sertraline (ZOLOFT) 100 MG tablet; Take 1.5 tablets (150 mg total) by mouth daily.  Dispense: 45 tablet; Refill: 3 Continue- traZODone (DESYREL) 100 MG tablet; TAKE 1 TABLET (100 MG TOTAL) BY MOUTH AT BEDTIME.  Dispense: 30 tablet; Refill: 3  2. Generalized anxiety disorder  Continue- sertraline (ZOLOFT) 100 MG tablet; Take 1.5 tablets (150 mg total) by mouth daily.  Dispense: 45 tablet; Refill: 3  Follow-up in 3 months    Salley Slaughter, NP 10/05/2021, 11:36 AM

## 2021-10-22 ENCOUNTER — Ambulatory Visit
Admission: RE | Admit: 2021-10-22 | Discharge: 2021-10-22 | Disposition: A | Discharge status: Home / Self Care | Payer: Self-pay | Source: Ambulatory Visit | Attending: Nurse Practitioner | Admitting: Nurse Practitioner

## 2021-10-22 ENCOUNTER — Other Ambulatory Visit: Payer: Self-pay

## 2021-10-22 DIAGNOSIS — K8301 Primary sclerosing cholangitis: Secondary | ICD-10-CM

## 2021-10-22 MED ORDER — GADOBENATE DIMEGLUMINE 529 MG/ML IV SOLN
20.0000 mL | Freq: Once | INTRAVENOUS | Status: AC | PRN
  Administered 2021-10-22: 20 mL via INTRAVENOUS

## 2021-10-27 ENCOUNTER — Other Ambulatory Visit: Payer: Self-pay | Admitting: Gastroenterology

## 2021-10-27 DIAGNOSIS — K219 Gastro-esophageal reflux disease without esophagitis: Secondary | ICD-10-CM

## 2021-11-01 ENCOUNTER — Encounter: Payer: Self-pay | Admitting: Internal Medicine

## 2021-11-01 ENCOUNTER — Ambulatory Visit: Payer: Self-pay | Admitting: Gastroenterology

## 2021-11-03 ENCOUNTER — Encounter: Payer: Self-pay | Admitting: Gastroenterology

## 2021-11-03 ENCOUNTER — Other Ambulatory Visit (INDEPENDENT_AMBULATORY_CARE_PROVIDER_SITE_OTHER): Payer: Medicare Other

## 2021-11-03 ENCOUNTER — Telehealth: Payer: Self-pay

## 2021-11-03 ENCOUNTER — Ambulatory Visit (INDEPENDENT_AMBULATORY_CARE_PROVIDER_SITE_OTHER): Payer: Medicare Other | Admitting: Gastroenterology

## 2021-11-03 VITALS — BP 132/80 | HR 107 | Ht 75.0 in | Wt 273.0 lb

## 2021-11-03 DIAGNOSIS — K219 Gastro-esophageal reflux disease without esophagitis: Secondary | ICD-10-CM

## 2021-11-03 DIAGNOSIS — K8301 Primary sclerosing cholangitis: Secondary | ICD-10-CM | POA: Diagnosis not present

## 2021-11-03 LAB — COMPREHENSIVE METABOLIC PANEL
ALT: 29 U/L (ref 0–53)
AST: 26 U/L (ref 0–37)
Albumin: 5.3 g/dL — ABNORMAL HIGH (ref 3.5–5.2)
Alkaline Phosphatase: 45 U/L (ref 39–117)
BUN: 10 mg/dL (ref 6–23)
CO2: 24 mEq/L (ref 19–32)
Calcium: 10.2 mg/dL (ref 8.4–10.5)
Chloride: 102 mEq/L (ref 96–112)
Creatinine, Ser: 1.28 mg/dL (ref 0.40–1.50)
GFR: 75.85 mL/min (ref >60.00)
Glucose, Bld: 107 mg/dL — ABNORMAL HIGH (ref 70–99)
Potassium: 3.9 mEq/L (ref 3.5–5.1)
Sodium: 138 mEq/L (ref 135–145)
Total Bilirubin: 1.3 mg/dL — ABNORMAL HIGH (ref 0.2–1.2)
Total Protein: 8.9 g/dL — ABNORMAL HIGH (ref 6.0–8.3)

## 2021-11-03 LAB — CBC WITH DIFFERENTIAL/PLATELET
Basophils Absolute: 0.1 10*3/uL (ref 0.0–0.1)
Basophils Relative: 0.8 % (ref 0.0–3.0)
Eosinophils Absolute: 0 10*3/uL (ref 0.0–0.7)
Eosinophils Relative: 0.6 % (ref 0.0–5.0)
HCT: 44.9 % (ref 39.0–52.0)
Hemoglobin: 14.8 g/dL (ref 13.0–17.0)
Lymphocytes Relative: 22.8 % (ref 12.0–46.0)
Lymphs Abs: 1.5 10*3/uL (ref 0.7–4.0)
MCHC: 32.9 g/dL (ref 30.0–36.0)
MCV: 92.1 fl (ref 78.0–100.0)
Monocytes Absolute: 0.5 10*3/uL (ref 0.1–1.0)
Monocytes Relative: 7.9 % (ref 3.0–12.0)
Neutro Abs: 4.3 10*3/uL (ref 1.4–7.7)
Neutrophils Relative %: 67.9 % (ref 43.0–77.0)
Platelets: 170 10*3/uL (ref 150.0–400.0)
RBC: 4.88 Mil/uL (ref 4.22–5.81)
RDW: 15.5 % (ref 11.5–15.5)
WBC: 6.4 10*3/uL (ref 4.0–10.5)

## 2021-11-03 MED ORDER — SUTAB 1479-225-188 MG PO TABS: 1.0000 | ORAL_TABLET | Freq: Once | ORAL | 0 refills | Status: AC

## 2021-11-03 NOTE — Progress Notes (Signed)
HPI :  31 year old male here for a follow-up visit for small duct PSC.  I have not seen him since January 2020.  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 He's had a complicated course over the past year. 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 Placed on Eliquis at that time. He was readmitted in November of that year with jaundice and underwent MRCP with subsequent ERCP. He had intrahepatic dilation with a stricture noted, brushed and dilated. He also had gallstones noted and was referred to general surgery for cholecystectomy which he underwent. CA 19-9 was elevated at the time of hospitalization. AFP normal. Brushings of the stricture were benign.  He has been established with [facility] and Dr. Zollie Scale of the hepatology clinic locally.  In 2021 he was admitted to the hospital with a perforated appendix and underwent surgery. He had his last colonoscopy with me in September 2018 which was normal and no evidence of IBD.  More recently he is felt pretty well.  He denies any abdominal pains.  No jaundice.  No pruritus.  He denies any diarrhea.  No blood in his stools.  He is compliant with his Eliquis.  In general he is doing pretty well physically.  He endorses ongoing issues with anxiety and depression which he has been struggling with, he is established with psychiatry.  Last year he did not have insurance but has it for this year.  He most recently had an MRCP with hepatology last month.  This was reviewed with them and he has no dominant stricture that warrants intervention at this time.  He has not had LFTs since July of last year.  He is due for DEXA scan which she has not been able to schedule yet and is also due for follow-up surveillance colonoscopy.  He had some thickening of his colon on prior CT scan in 2021 when he was admitted for his ruptured appendix.  Again he denies  any bowel changes.  Endoscopic history: Colonoscopy 06/25/2017 - normal colon and ileum, no evidence of IBD.      CT scan 06/16/20 - IMPRESSION: 1. There is nonspecific inflammatory changes which appears centered in the low pelvis predominantly upon the cecum and terminal ileum as well as upon what appears to be the base of an appendiceal remnant. 2. Review of the operative report reveals a complex procedure with features of a ruptured appendicitis and of inflamed peritoneal tissue. However, there is a persistent tubular structure which extends from the tip of the cecum in a retrocecal fashion towards the midline pelvis raising the question question whether the appendix was only partially resected as the more distended fluid-filled segment of the appendix is less readily apparent on this examination. 3. Additional likely reactive thickening is seen of the rectosigmoid. Some mild focal narrowing at the rectosigmoid junction could be inflammatory though normal peristalsis would present similarly. Consider visualization. 4. Asymmetric thickening towards the anterior bladder dome with a small tubular structure arising from the anterior midline bladder possibly reflecting a distal urachal remnant with some mucosal hyperemia and surrounding inflammation which could reflect superimposed infection or reactive change. Correlate with urinalysis. 5. Mild heterogeneity of the prostate and seminal vesicles, possibly reactive. 6. 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 7. Stable hypoattenuating foci in the right lobe liver, previously characterized as hemangiomata  on prior MRI.   MRCP 10/22/21: IMPRESSION: 1. There is minimal intrahepatic biliary ductal dilatation, the intrahepatic portion of the common bile duct somewhat irregular and distorted in contour, although not overtly strictured, with effacement of the common bile duct within the  pancreatic head. 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 2. Hypertrophy of the caudate lobe without overt cirrhotic morphology of the liver, this appearance likewise characteristic of primary sclerosing cholangitis. 3. 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 4. Status post cholecystectomy.   Past Medical History:  Diagnosis Date   GERD (gastroesophageal reflux disease)    Graves disease 08/18/2015   Hyperlipidemia    Hypothyroid    Palpitations    PSC (primary sclerosing cholangitis)    Thrombosis    superior mesenteric vein - 2019   Vitamin D deficiency      Past Surgical History:  Procedure Laterality Date   ANTERIOR CRUCIATE LIGAMENT REPAIR  2012   ERCP N/A 08/19/2018   Procedure: ENDOSCOPIC RETROGRADE CHOLANGIOPANCREATOGRAPHY (ERCP);  Surgeon: Milus Banister, MD;  Location: [facility];  Service: Endoscopy;  Laterality: N/A;   LAPAROSCOPIC APPENDECTOMY N/A 05/11/2020   Procedure: APPENDECTOMY LAPAROSCOPIC;  Surgeon: Johnathan Hausen, MD;  Location: [facility];  Service: General;  Laterality: N/A;   REMOVAL OF STONES  08/19/2018   Procedure: REMOVAL OF STONES;  Surgeon: Milus Banister, MD;  Location: [facility];  Service: Endoscopy;;   SPHINCTEROTOMY  08/19/2018   Procedure: Joan Mayans;  Surgeon: Milus Banister, MD;  Location: [facility];  Service: Endoscopy;;   WISDOM TOOTH EXTRACTION     Family History  Problem Relation Age of Onset   Diabetes Mother    Colon cancer Maternal Grandmother    Stomach cancer Maternal Grandmother    Diabetes Maternal Grandmother    Stomach cancer Maternal Grandfather    Diabetes Maternal Grandfather    Social History   Tobacco Use   Smoking status: Former    Types: Cigarettes    Quit date: 11/01/2021   Smokeless tobacco: Never   Tobacco comments:    Marijuana  Vaping Use   Vaping  Use: Former  Substance Use Topics   Alcohol use: No   Drug use: Not Currently    Types: Marijuana   Current Outpatient Medications  Medication Sig Dispense Refill   amLODipine (NORVASC) 2.5 MG tablet Take 1 tablet (2.5 mg total) by mouth daily. 30 tablet 1   apixaban (ELIQUIS) 5 MG TABS tablet TAKE 1 TABLET(5 MG) BY MOUTH TWICE DAILY (Patient taking differently: Take 5 mg by mouth 2 (two) times daily.) 180 tablet 0   ARIPiprazole (ABILIFY) 10 MG tablet Take 1 tablet (10 mg total) by mouth daily. 30 tablet 3   bisoprolol-hydrochlorothiazide (ZIAC) 10-6.25 MG tablet LAST REFILL - Take 1 tablet Daily for BP - ABSOLUTELY  Must have Office Visit before Refill 7 tablet 0   Cholecalciferol (VITAMIN D3) 5000 units CAPS Take 1 capsule (5,000 Units total) by mouth every other day. (Patient taking differently: Take 5,000 Units by mouth daily.)     ezetimibe (ZETIA) 10 MG tablet Take      1 tablet       Daily        for Cholesterol    - Must have Office Visit before Refill 14 tablet 0   hydrOXYzine (ATARAX/VISTARIL) 50 MG tablet Take 1 tablet (50 mg total) by mouth 3 (three)  times daily as needed for anxiety. 90 tablet 1   levothyroxine (SYNTHROID) 100 MCG tablet Take 1 tablet   Daily   on an empty stomach with only water for 30 minutes & no Antacid meds, Calcium or Magnesium for 4 hours & avoid Biotin 90 tablet 3   omeprazole (PRILOSEC) 40 MG capsule Take 1 capsule (40 mg total) by mouth daily. Please keep your November appointment for further refills. Thank you 30 capsule 1   sertraline (ZOLOFT) 100 MG tablet Take 1.5 tablets (150 mg total) by mouth daily. (Patient taking differently: Take 150 mg by mouth daily. Takes as needed) 45 tablet 3   tadalafil (CIALIS) 20 MG tablet Take  1/2 to 1 tablet  every 2 to 3 days  as needed for XXXX 30 tablet 0   traZODone (DESYREL) 100 MG tablet TAKE 1 TABLET (100 MG TOTAL) BY MOUTH AT BEDTIME. (Patient taking differently: Take by mouth at bedtime. As needed) 30 tablet 3    No current facility-administered medications for this visit.   Allergies  Allergen Reactions   Tylenol [Acetaminophen] Other (See Comments)    r/t elevated LFTs      Review of Systems: All systems reviewed and negative except where noted in HPI.    MR ABDOMEN MRCP W WO CONTAST  Result Date: 10/23/2021 CLINICAL DATA:  Primary sclerosing cholangitis EXAM: MRI ABDOMEN WITHOUT AND WITH CONTRAST (INCLUDING MRCP) TECHNIQUE: Multiplanar multisequence MR imaging of the abdomen was performed both before and after the administration of intravenous contrast. Heavily T2-weighted images of the biliary and pancreatic ducts were obtained, and three-dimensional MRCP images were rendered by post processing. CONTRAST:  4mL MULTIHANCE GADOBENATE DIMEGLUMINE 529 MG/ML IV SOLN COMPARISON:  CT abdomen pelvis, 06/16/2020 FINDINGS: Lower chest: No acute findings. Hepatobiliary: Hypertrophy of the caudate lobe without overt cirrhotic morphology of the liver. 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 No solid mass or other parenchymal abnormality identified. Status post cholecystectomy. There is minimal intrahepatic biliary ductal dilatation, the intrahepatic portion of the common bile duct somewhat irregular and distorted in contour, although not overtly strictured, with effacement of the common bile duct within the pancreatic head (series 14, image 27). 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 Pancreas: No mass, inflammatory changes, or other parenchymal abnormality identified. Spleen:  Within normal limits in size and appearance. Adrenals/Urinary Tract: No masses identified. No evidence of hydronephrosis. Stomach/Bowel: Visualized portions within the abdomen are unremarkable. Vascular/Lymphatic: No pathologically enlarged lymph nodes identified. No  abdominal aortic aneurysm demonstrated. Other:  None. Musculoskeletal: No suspicious bone lesions identified. IMPRESSION: 1. There is minimal intrahepatic biliary ductal dilatation, the intrahepatic portion of the common bile duct somewhat irregular and distorted in contour, although not overtly strictured, with effacement of the common bile duct within the pancreatic head. 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 2. Hypertrophy of the caudate lobe without overt cirrhotic morphology of the liver, this appearance likewise characteristic of primary sclerosing cholangitis. 3. 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 4. Status post cholecystectomy. Electronically Signed   By: Delanna Ahmadi M.D.   On: 10/23/2021 12:06    Physical Exam: BP 132/80    Pulse (!) 107    Ht 6\' 3"  (1.905 m)    Wt 273 lb (123.8 kg)  BMI 34.12 kg/m  Constitutional: Pleasant,well-developed, male in no acute distress. HEENT: Normocephalic and atraumatic. Conjunctivae are normal. No scleral icterus. Neck supple.  Cardiovascular: Normal rate, regular rhythm.  Pulmonary/chest: Effort normal and breath sounds normal. No wheezing, rales or rhonchi. Abdominal: Soft, nondistended, nontender.  There are no masses palpable.  Extremities: no edema Lymphadenopathy: No cervical adenopathy noted. Neurological: Alert and oriented to person place and time. Skin: Skin is warm and dry. No rashes noted. Psychiatric: Normal mood and affect. Behavior is normal.   ASSESSMENT AND PLAN: 31 year old male here for reassessment of following issues:  PSC  History as outlined above.  Diagnosed with PSC, he has had history of biliary stricture relieved with ERCP and balloon dilation with negative brushings.  Follow-up MRCP most recently has not shown any high-grade or dominant strictures.  He is asymptomatic  from this at present time.  We discussed PSC in general and risks of progressive disease including cirrhosis, as well as other comorbidities to include osteoporosis, bone fracture, IBD.  He is overdue for basic labs asked him to go to the lab today.  He is agreeable for a DEXA scan to assess for osteoporosis.  He is due for a colonoscopy every 3 to 5 years to assess for presence of IBD given this is quite commonly associated with PSC.  He has no symptoms currently.  We discussed colonoscopy, risks and benefits of that and anesthesia and he wants to proceed.  He will need clearance to hold his Eliquis for 2 days prior to the exam.  He will continue to have imaging yearly of his liver coordinated by hepatology, and will continue to see them as well.  He will continue to follow-up with behavioral health for underlying anxiety depression.  Plan: - lab today - CBC, CMET - DEXA scan - rule out osteoporosis / osteopenia - Colonoscopy - will get approval to hold Eliquis for 2 days - continue to follow up with Hepatology, needs yearly imaging of his liver - follow up with behavioral health for anxiety / depression  Jolly Mango, MD [facility]

## 2021-11-03 NOTE — Patient Instructions (Addendum)
If you are age 30 or older, your body mass index should be between 23-30. Your Body mass index is 34.12 kg/m. If this is out of the aforementioned range listed, please consider follow up with your Primary Care Provider.  If you are age 83 or younger, your body mass index should be between 19-25. Your Body mass index is 34.12 kg/m. If this is out of the aformentioned range listed, please consider follow up with your Primary Care Provider.   ________________________________________________________  The [facility] providers would like to encourage you to use [facility] to communicate with providers for non-urgent requests or questions.  Due to long hold times on the telephone, sending your provider a message by [facility] may be a faster and more efficient way to get a response.  Please allow 48 business hours for a response.  Please remember that this is for non-urgent requests.  _______________________________________________________  Kyle Gonzales have been scheduled for a colonoscopy. Please follow written instructions given to you at your visit today.  Please pick up your prep supplies at the pharmacy within the next 1-3 days. If you use inhalers (even only as needed), please bring them with you on the day of your procedure.  You will be contacted by our office prior to your procedure for directions on holding your Eliquis.  If you do not hear from our office 1 week prior to your scheduled procedure, please call [phone] to discuss.    Please go to the lab in the basement of our building to have a Bone Density Scan done as you leave today. Hit "B" for basement when you get on the elevator.  When the doors open Radiology is straight ahead.  We will call/MyChart you with the results. Thank you.   Continue omeprazole 40 mg once daily.  Thank you for entrusting me with your care and for choosing [facility], Dr. [place]ellar

## 2021-11-03 NOTE — Telephone Encounter (Signed)
° °  Kyle Gonzales [birth year] [id number]  Dear Dr. Melford Aase:  We have scheduled the above named patient for a Colonoscopy procedure. Our records show that he is on anticoagulation therapy.  Please advise as to whether the patient may come off their therapy of ELIQUIS 2 days prior to their procedure which is scheduled for 12-20-21.  Please route your response to Lemar Lofty, CMA or fax response to [phone].  Sincerely,    [facility]

## 2021-11-07 ENCOUNTER — Ambulatory Visit: Payer: Self-pay | Admitting: Internal Medicine

## 2021-11-08 NOTE — Telephone Encounter (Signed)
Inbound call from patient returning your call. 

## 2021-11-08 NOTE — Telephone Encounter (Signed)
Patients mother is requesting to speak with you.

## 2021-11-08 NOTE — Telephone Encounter (Signed)
[name], MD  Roetta Sessions, [place] today's appt just prior to appt time  &  rescheduled   - we'll see if he keeps that appt.   - bill mck

## 2021-11-09 NOTE — Telephone Encounter (Signed)
Called patient.  Relayed results and recommendations of recent labs.  He has an upcoming rescheduled appointment with Dr. Melford Aase on 2-10 at 10:00 am. I let him know I sent the lab results over to Dr. Melford Aase. We also need clearance for him to hold his Eliquis for 2 days prior to his colonoscopy on 3-7.  Spoke with patient's mother and she let me know they changed the day of his procedure to Friday, 3-10 at 1:30pm and would like new instructions mailed. Sent today

## 2021-11-24 NOTE — Addendum Note (Signed)
Addended by: [name] on: 11/24/2021 11:24 PM   Modules accepted: Orders

## 2021-11-24 NOTE — Progress Notes (Signed)
Future Appointments  Date Time Provider Department  11/25/2021 10:30 AM [name], MD [facility]  12/22/2021  3:00 PM Salley Slaughter, NP [facility]  12/23/2021  1:30 PM Armbruster, Carlota Raspberry, MD [facility]  05/08/2022  3:00 PM [name], MD [facility]    History of Present Illness:                                               Patient finally showed up for appointment after a 7 month hiatus & several missed appointments .       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  Patient has history of poor compliance with taking his meds, poor compliance with scheduled office visits and history of illegal drug abuse.                                                       In Nov 2019, he had ERCP/Sphincterotomy by Dr Ardis Hughs.  Patient is established now with Dr Havery Moros who has scheduled patient for a colonoscopy  to screen for IBD. Recent labs on 11/03/2021 showed Nl CBC & CMET showed sl elevated Creat 1.28 ( from 0.74 & 1.09 ) and GFR 75.85 (NL>60)    [[ Patient is unemployed and has a habit of chronic daily Marijuana use and in the past that his mother provides him money to purchase his Marijuana. Today he "alleges" , he has not used Marijuana for 2 weeks .  ]]       Patient is treated for HTN & BP has been controlled at home. Todays BP is at goal - 130/80. Patient has had no complaints of any cardiac type chest pain, palpitations, dyspnea Vertell Limber /PND, dizziness, claudication, or dependent edema.       Hyperlipidemia is not controlled with diet & meds.  Last Lipids were not at goal & patient has been off of meds :   Lab Results  Component Value Date   CHOL 234 (H) 05/05/2020   HDL 45 05/05/2020   LDLCALC 168 (H) 05/05/2020   TRIG 70 05/17/2020   CHOLHDL 5.2 (H) 05/05/2020    Also, the  patient has history of PreDiabetes and has had no symptoms of reactive hypoglycemia, diabetic polys, paresthesias or visual blurring.  Last A1c was not at goal :  Lab Results  Component Value Date   HGBA1C 5.7 (H) 05/05/2020                                                         Further, the patient also has history of Vitamin D Deficiency and does not supplement vitamin D as recommended. Last vitamin D was still very low  ( goal is betw 70-100  ) :  Lab Results  Component Value Date   VD25OH 32 05/05/2020    Current Outpatient Medications on File Prior  to Visit  Medication Sig   amLODipine (NORVASC) 2.5 MG tablet Take 1 tablet  daily.   apixaban (ELIQUIS) 5 MG TABS tablet TAKE 1 TABLET TWICE DAILY    ARIPiprazole (ABILIFY) 10 MG tablet Take 1 tablet daily.   bisoprolol-hctz  10-6.25 MG tablet Take 1 tablet Daily for BP   VITAMIN D 5000 units  Take 5,000 Units daily   ezetimibe (ZETIA) 10 MG tablet Take 1 tablet Daily    hydrOXYzine 50 MG tablet Take 1 tablet 3  times daily as needed for anxiety.   levothyroxine 100 MCG tablet Take 1 tablet   Daily      omeprazole (PRILOSEC) 40 MG capsule Take 1 capsule  daily.    sertraline (ZOLOFT) 100 MG tablet Take 1.5 tablets  as needed)   tadalafil (CIALIS) 20 MG tablet Take  1/2 to 1 tablet  every 2 to 3 days  as needed for    traZODone (DESYREL) 100 MG tablet Take at bedtime. As needed)    Allergies  Allergen Reactions   Tylenol [Acetaminophen] r/t elevated LFTs        PMHx:   Past Medical History:  Diagnosis Date   GERD (gastroesophageal reflux disease)    Graves disease 08/18/2015   Hyperlipidemia    Hypothyroid    Palpitations    PSC (primary sclerosing cholangitis)    Thrombosis    superior mesenteric vein - 2019   Vitamin D deficiency     Immunization History  Administered Date(s) Administered   PPD Test 12/06/2015   Td 10/16/2008     Past Surgical History:  Procedure Laterality Date   ANTERIOR CRUCIATE  LIGAMENT REPAIR  2012   ERCP N/A 08/19/2018   Procedure: ENDOSCOPIC RETROGRADE CHOLANGIOPANCREATOGRAPHY (ERCP);  Surgeon: Milus Banister, MD;  Location: [facility];  Service: Endoscopy;  Laterality: N/A;   LAPAROSCOPIC APPENDECTOMY N/A 05/11/2020   Procedure: APPENDECTOMY LAPAROSCOPIC;  Surgeon: Johnathan Hausen, MD;  Location: [facility];  Service: General;  Laterality: N/A;   REMOVAL OF STONES  08/19/2018   Procedure: REMOVAL OF STONES;  Surgeon: Milus Banister, MD;  Location: [facility];  Service: Endoscopy;;   SPHINCTEROTOMY  08/19/2018   Procedure: Joan Mayans;  Surgeon: Milus Banister, MD;  Location: [facility];  Service: Endoscopy;;   WISDOM TOOTH EXTRACTION      FHx:    Reviewed / unchanged  SHx:    Reviewed / unchanged   Systems Review:  Constitutional: Denies fever, chills, wt changes, headaches, insomnia, fatigue, night sweats, change in appetite. Eyes: Denies redness, blurred vision, diplopia, discharge, itchy, watery eyes.  ENT: Denies discharge, congestion, post nasal drip, epistaxis, sore throat, earache, hearing loss, dental pain, tinnitus, vertigo, sinus pain, snoring.  CV: Denies chest pain, palpitations, irregular heartbeat, syncope, dyspnea, diaphoresis, orthopnea, PND, claudication or edema. Respiratory: denies cough, dyspnea, DOE, pleurisy, hoarseness, laryngitis, wheezing.  Gastrointestinal: Denies dysphagia, odynophagia, heartburn, reflux, water brash, abdominal pain or cramps, nausea, vomiting, bloating, diarrhea, constipation, hematemesis, melena, hematochezia  or hemorrhoids. Genitourinary: Denies dysuria, frequency, urgency, nocturia, hesitancy, discharge, hematuria or flank pain. Musculoskeletal: Denies arthralgias, myalgias, stiffness, jt. swelling, pain, limping or strain/sprain.  Skin: Denies pruritus, rash, hives, warts, acne, eczema or change in skin lesion(s). Neuro: No weakness, tremor, incoordination, spasms, paresthesia or pain. Psychiatric:  Denies confusion, memory loss or sensory loss. Endo: Denies change in weight, skin or hair change.  Heme/Lymph: No excessive bleeding, bruising or enlarged lymph nodes.  Physical Exam  BP 130/80    Pulse 100  Temp (!) 97.3 F (36.3 C)    Resp 16    Ht 6\' 3"  (1.905 m)    Wt 272 lb (123.4 kg)    SpO2 97%    BMI 34.00 kg/m   Appears  well nourished, well groomed  and in no distress.  Skin: Warm, dry without exposed rashes, lesions or ecchymosis apparent. Patient has # 2 brand new tattoos covered with Tegaderm patches over his Right lateral neck .   Eyes: PERRLA, EOMs, conjunctiva no swelling or erythema. Sinuses: No frontal/maxillary tenderness ENT/Mouth: EAC's clear, TM's nl w/o erythema, bulging. Nares clear w/o erythema, swelling, exudates. Oropharynx clear without erythema or exudates. Oral hygiene is good. Tongue normal, non obstructing. Hearing intact.  Neck: Supple. Thyroid not palpable. Car 2+/2+ without bruits, nodes or JVD. Chest: Respirations nl with BS clear & equal w/o rales, rhonchi, wheezing or stridor.  Cor: Heart sounds normal w/ regular rate and rhythm without sig. murmurs, gallops, clicks or rubs. Peripheral pulses normal and equal  without edema.  Abdomen: Soft & bowel sounds normal. Non-tender w/o guarding, rebound, hernias, masses or organomegaly.  Lymphatics: Unremarkable.  Musculoskeletal: Full ROM all peripheral extremities, joint stability, 5/5 strength and normal gait.   Neuro: Cranial nerves intact, reflexes equal bilaterally. Sensory-motor testing grossly intact. Tendon reflexes grossly intact.  Pysch: Alert & oriented x 3.  Insight and judgement nl & appropriate. No ideations.  Assessment and Plan:  1. Essential hypertension  - Continue medication, monitor blood pressure at home.  - Continue DASH diet.  Reminder to go to the ER if any CP,  SOB, nausea, dizziness, severe HA, changes vision/speech.   - Magnesium  2. PSC (primary sclerosing  cholangitis)   3. Mesenteric vein thrombosis (HCC)  - Protime-INR - APTT  4. Abnormal glucose  - Recommend diet, exercise  - Lifestyle modifications.  - Monitor appropriate labs.   - Hemoglobin A1c - Insulin, random  5. Vitamin D deficiency  - Recommend start  supplementation as repeatedly advised    - VITAMIN D 25 Hydroxy   6. Prediabetes  - Recommend diet, exercise  - Lifestyle modifications.  - Monitor appropriate labs.   - Hemoglobin A1c - Insulin, random  7. Hyperlipidemia, mixed  - Continue diet/meds, exercise,& lifestyle modifications.  - Continue monitor periodic cholesterol/liver & renal functions    - Lipid panel  8. Hypothyroidism  - TSH to monitor compliance  9. 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 Medication management  - Magnesium - Lipid panel - TSH - Hemoglobin A1c - Insulin, random - VITAMIN D 25 Hydroxy  - Protime-INR - APTT       Advised  patient that he may proceed with scheduled  Colonoscopy as recommended by Dr Havery Moros and to hold his Eliquis for 3 days prior to the scheduled procedure.                   Discussed  regular exercise, BP monitoring, weight control to achieve/maintain BMI less than 25 and discussed med and SE's. Recommended labs to assess and monitor clinical status with further disposition pending results of labs.  I discussed the assessment and treatment plan with the patient. The patient was provided an opportunity to ask questions and all were answered. The patient agreed with the plan and demonstrated an understanding of the instructions.  I provided over 30 minutes of exam, counseling, chart review and  complex  critical decision making.        The patient was advised to call back or seek an in-person evaluation if the symptoms worsen or if the condition fails to improve as anticipated.   Kirtland Bouchard, MD

## 2021-11-25 ENCOUNTER — Encounter: Payer: Self-pay | Admitting: Internal Medicine

## 2021-11-25 ENCOUNTER — Ambulatory Visit (INDEPENDENT_AMBULATORY_CARE_PROVIDER_SITE_OTHER): Payer: Managed Care, Other (non HMO) | Admitting: Internal Medicine

## 2021-11-25 ENCOUNTER — Other Ambulatory Visit: Payer: Self-pay

## 2021-11-25 VITALS — BP 130/80 | HR 100 | Temp 97.3°F | Resp 16 | Ht 75.0 in | Wt 272.0 lb

## 2021-11-25 DIAGNOSIS — R7303 Prediabetes: Secondary | ICD-10-CM | POA: Diagnosis not present

## 2021-11-25 DIAGNOSIS — D6859 Other primary thrombophilia: Secondary | ICD-10-CM

## 2021-11-25 DIAGNOSIS — E559 Vitamin D deficiency, unspecified: Secondary | ICD-10-CM

## 2021-11-25 DIAGNOSIS — Z79899 Other long term (current) drug therapy: Secondary | ICD-10-CM

## 2021-11-25 DIAGNOSIS — R7309 Other abnormal glucose: Secondary | ICD-10-CM | POA: Diagnosis not present

## 2021-11-25 DIAGNOSIS — E782 Mixed hyperlipidemia: Secondary | ICD-10-CM | POA: Diagnosis not present

## 2021-11-25 DIAGNOSIS — K8301 Primary sclerosing cholangitis: Secondary | ICD-10-CM

## 2021-11-25 DIAGNOSIS — D689 Coagulation defect, unspecified: Secondary | ICD-10-CM | POA: Diagnosis not present

## 2021-11-25 DIAGNOSIS — I10 Essential (primary) hypertension: Secondary | ICD-10-CM | POA: Diagnosis not present

## 2021-11-25 DIAGNOSIS — E039 Hypothyroidism, unspecified: Secondary | ICD-10-CM

## 2021-11-25 DIAGNOSIS — K55069 Acute infarction of intestine, part and extent unspecified: Secondary | ICD-10-CM | POA: Diagnosis not present

## 2021-11-25 NOTE — Patient Instructions (Signed)
Due to recent changes in healthcare laws, you may see the results of your imaging and laboratory studies on MyChart before your provider has had a chance to review them.  We understand that in some cases there may be results that are confusing or concerning to you. Not all laboratory results come back in the same time frame and the provider may be waiting for multiple results in order to interpret others.  Please give [place] 48 hours in order for your provider to thoroughly review all the results before contacting the office for clarification of your results.  ++++++++++++++++++++++++++++++++++  LEAVE OFF      ELIQUIS for 3 DAYS   Before your COLONOSCOPY with  Dr Havery Moros  ++++++++++++++++++++++++++++++++++  Vit D  & Vit C 1,000 mg   are recommended to help protect  against the Covid-19 and other Corona viruses.    Also it's recommended  to take  Zinc 50 mg  to help  protect against the Covid-19   and best place to get  is also on [email or web address]  and don't pay more than 6-8 cents /pill !   ===================================== Coronavirus (COVID-19) Are you at risk?  Are you at risk for the Coronavirus (COVID-19)?  To be considered HIGH RISK for Coronavirus (COVID-19), you have to meet the following criteria:  Traveled to [place], [place], [place], [place] or [place]; or in the [place] to [place], [place], [place]  or [place]; and have fever, cough, and shortness of breath within the last 2 weeks of travel OR Been in close contact with a person diagnosed with COVID-19 within the last 2 weeks and have  fever, cough,and shortness of breath  IF YOU DO NOT MEET THESE CRITERIA, YOU ARE CONSIDERED LOW RISK FOR COVID-19.  What to do if you are HIGH RISK for COVID-19?  If you are having a medical emergency, call 911. Seek medical care right away. Before you go to a doctors office, urgent care or emergency department,  call ahead and tell them about your recent travel, contact  with someone diagnosed with COVID-19   and your symptoms.  You should receive instructions from your physicians office regarding next steps of care.  When you arrive at healthcare provider, tell the healthcare staff immediately you have returned from  visiting [place], [place], [place], [place] or [place]; or traveled in the [place] to [place], [place],  [place] or [place] in the last two weeks or you have been in close contact with a person diagnosed with  COVID-19 in the last 2 weeks.   Tell the health care staff about your symptoms: fever, cough and shortness of breath. After you have been seen by a medical provider, you will be either: Tested for (COVID-19) and discharged home on quarantine except to seek medical care if  symptoms worsen, and asked to  Stay home and avoid contact with others until you get your results (4-5 days)  Avoid travel on public transportation if possible (such as bus, train, or airplane) or Sent to the Emergency Department by EMS for evaluation, COVID-19 testing  and  possible admission depending on your condition and test results.  What to do if you are LOW RISK for COVID-19?  Reduce your risk of any infection by using the same precautions used for avoiding the common cold or flu:  Wash your hands often with soap and warm water for at least 20 seconds.  If soap and water are not readily available,  use an alcohol-based hand sanitizer with at least 60% alcohol.  If coughing or sneezing, cover your mouth and nose by coughing or sneezing into the elbow areas of your shirt or coat,  into a tissue or into your sleeve (not your hands). Avoid shaking hands with others and consider head nods or verbal greetings only. Avoid touching your eyes, nose, or mouth with unwashed hands.  Avoid close contact with people who are sick. Avoid places or events with large numbers of people in one location, like concerts or sporting events. Carefully consider travel  plans you have or are making. If you are planning any travel outside or inside the [place], visit the [place] webpage for the latest health notices. If you have some symptoms but not all symptoms, continue to monitor at home and seek medical attention  if your symptoms worsen. If you are having a medical emergency, call 911.   ++++++++++++++++++++++++++++++++ Recommend Adult Low Dose Aspirin or  coated  Aspirin 81 mg daily  To reduce risk of Colon Cancer 40 %,  Skin Cancer 26 % ,  Melanoma 46%  and  Pancreatic cancer 60% ++++++++++++++++++++++++++++++++ Vitamin D goal  is between 70-100.  Please make sure that you are taking your Vitamin D as directed.  It is very important as a natural anti-inflammatory  helping hair, skin, and nails, as well as reducing stroke and heart attack risk.  It helps your bones and helps with mood. It also decreases numerous cancer risks so please take it as directed.  Low Vit D is associated with a 200-300% higher risk for CANCER  and 200-300% higher risk for HEART   ATTACK  &  STROKE.   .....................................Marland Kitchen It is also associated with higher death rate at younger ages,  autoimmune diseases like Rheumatoid arthritis, Lupus, Multiple Sclerosis.    Also many other serious conditions, like depression, Alzheimer's Dementia, infertility, muscle aches, fatigue, fibromyalgia - just to name a few. ++++++++++++++++++++ Recommend the book "The END of DIETING" by Dr Excell Seltzer  & the book "The END of DIABETES " by Dr Excell Seltzer At [email or web address] - get book & Audio CD's    Being diabetic has a  300% increased risk for heart attack, stroke, cancer, and alzheimer- type vascular dementia. It is very important that you work harder with diet by avoiding all foods that are white. Avoid white rice (brown & wild rice is OK), white potatoes (sweetpotatoes in moderation is OK), White bread or wheat bread or anything made out of white flour like  bagels, donuts, rolls, buns, biscuits, cakes, pastries, cookies, pizza crust, and pasta (made from white flour & egg whites) - vegetarian pasta or spinach or wheat pasta is OK. Multigrain breads like Arnold's or Pepperidge [employer], or multigrain sandwich thins or flatbreads.  Diet, exercise and weight loss can reverse and cure diabetes in the early stages.  Diet, exercise and weight loss is very important in the control and prevention of complications of diabetes which affects every system in your body, ie. Brain - dementia/stroke, eyes - glaucoma/blindness, heart - heart attack/heart failure, kidneys - dialysis, stomach - gastric paralysis, intestines - malabsorption, nerves - severe painful neuritis, circulation - gangrene & loss of a leg(s), and finally cancer and Alzheimers.    I recommend avoid fried & greasy foods,  sweets/candy, white rice (brown or wild rice or Quinoa is OK), white potatoes (sweet potatoes are OK) - anything made from white flour - bagels, doughnuts, rolls, buns, biscuits,white and  wheat breads, pizza crust and traditional pasta made of white flour & egg white(vegetarian pasta or spinach or wheat pasta is OK).  Multi-grain bread is OK - like multi-grain flat bread or sandwich thins. Avoid alcohol in excess. Exercise is also important.    Eat all the vegetables you want - avoid meat, especially red meat and dairy - especially cheese.  Cheese is the most concentrated form of trans-fats which is the worst thing to clog up our arteries. Veggie cheese is OK which can be found in the fresh produce section at [employer] or [employer] or [employer]  +++++++++++++++++++++ DASH Eating Plan  DASH stands for "Dietary Approaches to Stop Hypertension."   The DASH eating plan is a healthy eating plan that has been shown to reduce high blood pressure (hypertension). Additional health benefits may include reducing the risk of type 2 diabetes mellitus, heart disease, and stroke. The DASH eating  plan may also help with weight loss. WHAT DO I NEED TO KNOW ABOUT THE DASH EATING PLAN? For the DASH eating plan, you will follow these general guidelines: Choose foods with a percent daily value for sodium of less than 5% (as listed on the food label). Use salt-free seasonings or herbs instead of table salt or sea salt. Check with your health care provider or pharmacist before using salt substitutes. Eat lower-sodium products, often labeled as "lower sodium" or "no salt added." Eat fresh foods. Eat more vegetables, fruits, and low-fat dairy products. Choose whole grains. Look for the word "whole" as the first word in the ingredient list. Choose fish  Limit sweets, desserts, sugars, and sugary drinks. Choose heart-healthy fats. Eat veggie cheese  Eat more home-cooked food and less restaurant, buffet, and fast food. Limit fried foods. Cook foods using methods other than frying. Limit canned vegetables. If you do use them, rinse them well to decrease the sodium. When eating at a restaurant, ask that your food be prepared with less salt, or no salt if possible.                      WHAT FOODS CAN I EAT? Read Dr Fara Olden Fuhrman's books on The End of Dieting & The End of Diabetes  Grains Whole grain or whole wheat bread. Brown rice. Whole grain or whole wheat pasta. Quinoa, bulgur, and whole grain cereals. Low-sodium cereals. Corn or whole wheat flour tortillas. Whole grain cornbread. Whole grain crackers. Low-sodium crackers.  Vegetables Fresh or frozen vegetables (raw, steamed, roasted, or grilled). Low-sodium or reduced-sodium tomato and vegetable juices. Low-sodium or reduced-sodium tomato sauce and paste. Low-sodium or reduced-sodium canned vegetables.   Fruits All fresh, canned (in natural juice), or frozen fruits.  Protein Products  All fish and seafood.  Dried beans, peas, or lentils. Unsalted nuts and seeds. Unsalted canned beans.  Dairy Low-fat dairy products, such as skim or 1%  milk, 2% or reduced-fat cheeses, low-fat ricotta or cottage cheese, or plain low-fat yogurt. Low-sodium or reduced-sodium cheeses.  Fats and Oils Tub margarines without trans fats. Light or reduced-fat mayonnaise and salad dressings (reduced sodium). Avocado. Safflower, olive, or canola oils. Natural peanut or almond butter.  Other Unsalted popcorn and pretzels. The items listed above may not be a complete list of recommended foods or beverages. Contact your dietitian for more options.  +++++++++++++++  WHAT FOODS ARE NOT RECOMMENDED? Grains/ White flour or wheat flour White bread. White pasta. White rice. Refined cornbread. Bagels and croissants. Crackers that contain trans fat.  Vegetables  Creamed or fried vegetables. Vegetables in a . Regular canned vegetables. Regular canned tomato sauce and paste. Regular tomato and vegetable juices.  Fruits Dried fruits. Canned fruit in light or heavy syrup. Fruit juice.  Meat and Other Protein Products Meat in general - RED meat & White meat.  Fatty cuts of meat. Ribs, chicken wings, all processed meats as bacon, sausage, bologna, salami, fatback, hot dogs, bratwurst and packaged luncheon meats.  Dairy Whole or 2% milk, cream, half-and-half, and cream cheese. Whole-fat or sweetened yogurt. Full-fat cheeses or blue cheese. Non-dairy creamers and whipped toppings. Processed cheese, cheese spreads, or cheese curds.  Condiments Onion and garlic salt, seasoned salt, table salt, and sea salt. Canned and packaged gravies. Worcestershire sauce. Tartar sauce. Barbecue sauce. Teriyaki sauce. Soy sauce, including reduced sodium. Steak sauce. Fish sauce. Oyster sauce. Cocktail sauce. Horseradish. Ketchup and mustard. Meat flavorings and tenderizers. Bouillon cubes. Hot sauce. Tabasco sauce. Marinades. Taco seasonings. Relishes.  Fats and Oils Butter, stick margarine, lard, shortening and bacon fat. Coconut, palm kernel, or palm oils. Regular salad  dressings.  Pickles and olives. Salted popcorn and pretzels.  The items listed above may not be a complete list of foods and beverages to avoid.

## 2021-11-26 ENCOUNTER — Other Ambulatory Visit: Payer: Self-pay | Admitting: Internal Medicine

## 2021-11-26 DIAGNOSIS — E039 Hypothyroidism, unspecified: Secondary | ICD-10-CM

## 2021-11-26 DIAGNOSIS — E782 Mixed hyperlipidemia: Secondary | ICD-10-CM

## 2021-11-26 MED ORDER — EZETIMIBE 10 MG PO TABS: ORAL_TABLET | ORAL | 1 refills | Status: AC

## 2021-11-26 MED ORDER — ROSUVASTATIN CALCIUM 10 MG PO TABS: ORAL_TABLET | ORAL | 1 refills | Status: AC

## 2021-11-26 MED ORDER — LEVOTHYROXINE SODIUM 100 MCG PO TABS: ORAL_TABLET | ORAL | 1 refills | Status: DC

## 2021-11-27 NOTE — Progress Notes (Signed)
=============================================================== - Test results slightly outside the reference range are not unusual. If there is anything important, I will review this with you,  otherwise it is considered normal test values.  If you have further questions,  please do not hesitate to contact me at the office or via My Chart.  =============================================================== ===============================================================  Protime / INR is Normal  &  PPT blood clotting test is also Normal which                 shows that your are not taking your Eliquis as prescribed & recommended     - Please Restart your Eliquis as prescribed                                                     & let me know if you need a new Rx called in    But - Very Important                               - Do not take the Eliquis for 3 days before your colonoscopy   xxxxxxxxxxxxxxxxxxxxxxxxxxxxxxxxxxxxxxxxxxxx[id number] [id number] [id number]  -  Total  Chol =    221    - Elevated             (  Ideal  or  Goal is less than 180  !  )   - and   -  Bad / Dangerous LDL  Chol = 131   - also very  Elevated              (  Ideal  or  Goal is less than 70  !  )   - Obviously not on a low Cholesterol diet Nor taking your Cholesterol meds, So  - Sent a new Rx for Zetia (Ezetimibe) to your Drug store & also sent a Rx for  Rosuvastatin (Crestor) to your Drugstore  =============================================================== ===============================================================  DIET IS STILL VERY IMPORTANT  !   - Treating with meds to lower Cholesterol is treating the result                                        &       NOT treating the cause  - The cause is Bad Diet !  - Read or  listen to   Dr Wyman Songster 's book    " How Not to Die ! "    - Recommend a stricter plant based low cholesterol diet    - Cholesterol only comes from animal sources  - ie. meat, dairy, egg yolks  - Eat all the vegetables you want.  - Avoid meat, especially red meat - Beef AND Pork . - Avoid cheese & dairy - milk & ice cream.    - Cheese is the most concentrated form of trans-fats which  is the worst thing to clog up our arteries.   - Veggie cheese is OK which can be found in the fresh   produce section at [employer] or [employer] or [employer] ============================================================ ============================================================   -  So,  As a last resort,  you must  take medicines since you refuse to improve your diet   ============================================================ ============================================================  - Also Triglycerides (   232   ) or fats in blood are  also  too high  (goal is less than 150)    - Recommend avoid fried & greasy foods,  sweets / candy,   - Avoid white rice  (brown or wild rice or Quinoa is OK),   - Avoid white potatoes  (sweet potatoes are OK)   - Avoid anything made from white flour  - bagels, doughnuts, rolls, buns, biscuits, white and   wheat breads, pizza crust and traditional  pasta made of white flour & egg white  - (vegetarian pasta or spinach or wheat pasta is OK).    - Multi-grain bread is OK - like multi-grain flat bread or  sandwich thins.   - Avoid alcohol in excess.   - Exercise is also important. =============================================================== ===============================================================  -  TSH   Test  (Thyroid) is extremely elevated                                       which means Thyroid Hormone in blood is Extremely Low !  - Obviously you're not taking your thyroid meds,                                                              so new Prescription sent in to your drugstore    - Extremely low thyroid  (Like yours)    can & will      cause many other     life threatening effects in your body - like heart problems - which will lead to death  - So, a Refill for your Thyroid medicine is sent to your drug store  - It's no wonder you feel so bad, since you don't take your meds                                                                          - You are your own WORST enemy  !   =============================================================== ===============================================================  -  A1c = 5.9% is elevated in the borderline and                                                        early or pre-diabetes range which has the same   300% increased risk for                        heart attack,  stroke,                                               cancer and           alzheimer- type vascular dementia as full blown diabetes.   But the good news is that with better diet  & exercise with                                                  weight loss can cure the early diabetes at this point. =============================================================== ===============================================================  -  Vitamin D = 19 - is also extremely low which means                                                                    you are NOT Taking it as recommended - Vitamin D goal is between 70-100.   - Please START  Vitamin D 10,000 units  /day - This is Extremely important  - It is very important as a natural anti-inflammatory and helping the  immune system protect against viral infections, like the Covid-19    helping hair, skin, and nails, as well as reducing stroke and  heart attack risk.   - It helps your bones and helps with mood.  - It also decreases numerous cancer  risks so please                                                                               take it as directed.   - Low Vit D is associated with a 200-300% higher risk for CANCER   and 200-300% higher risk for HEART   ATTACK  &  STROKE.    - It is also associated with higher death rate at younger ages,   autoimmune diseases like Rheumatoid arthritis,                                                                          Lupus,  Multiple Sclerosis.     - Also many other serious conditions, like                                                     Depression,                                                             Alzheimer's Dementia,                                                                                        infertility,                                                                                               muscle aches,                                                                                                            fatigue,                                                                                                              ibromyalgia   - just to name a few. =============================================================== ===============================================================  - Please schedule an office visit in 1 month to recheck labs.  =============================================================== ===============================================================

## 2021-11-28 ENCOUNTER — Telehealth: Payer: Self-pay

## 2021-11-28 LAB — HEMOGLOBIN A1C
Hgb A1c MFr Bld: 5.9 % of total Hgb — ABNORMAL HIGH (ref <5.7)
Mean Plasma Glucose: 123 mg/dL
eAG (mmol/L): 6.8 mmol/L

## 2021-11-28 LAB — TSH: TSH: 79.34 mIU/L — ABNORMAL HIGH (ref 0.40–4.50)

## 2021-11-28 LAB — LIPID PANEL
Cholesterol: 221 mg/dL — ABNORMAL HIGH (ref <200)
HDL: 53 mg/dL (ref >=40)
LDL Cholesterol (Calc): 131 mg/dL (calc) — ABNORMAL HIGH
Non-HDL Cholesterol (Calc): 168 mg/dL (calc) — ABNORMAL HIGH (ref <130)
Total CHOL/HDL Ratio: 4.2 (calc) (ref <5.0)
Triglycerides: 232 mg/dL — ABNORMAL HIGH (ref <150)

## 2021-11-28 LAB — VITAMIN D 25 HYDROXY (VIT D DEFICIENCY, FRACTURES): Vit D, 25-Hydroxy: 19 ng/mL — ABNORMAL LOW (ref 30–100)

## 2021-11-28 LAB — INSULIN, RANDOM: Insulin: 140.4 u[IU]/mL — ABNORMAL HIGH

## 2021-11-28 LAB — MAGNESIUM: Magnesium: 2.1 mg/dL (ref 1.5–2.5)

## 2021-11-28 LAB — PROTIME-INR
INR: 0.9
Prothrombin Time: 9.5 s (ref 9.0–11.5)

## 2021-11-28 LAB — APTT: aPTT: 28 s (ref 23–32)

## 2021-11-28 NOTE — Telephone Encounter (Signed)
Per Dr. Melford Aase, ok for patient to hold his Eliquis prior to procedure on 3-10.  Called and spoke to patient.  He expressed understanding not to take his Eliquis for 2 days starting on 3-8 for his procedure on 3-10.

## 2021-11-28 NOTE — Telephone Encounter (Signed)
Called and spoke to patient.  He expressed understanding not to take his Eliquis for 2 days starting on 3-8 for his procedure on 3-10.

## 2021-12-02 ENCOUNTER — Other Ambulatory Visit: Payer: Self-pay | Admitting: Gastroenterology

## 2021-12-02 ENCOUNTER — Other Ambulatory Visit (HOSPITAL_COMMUNITY): Payer: Self-pay | Admitting: Psychiatry

## 2021-12-02 DIAGNOSIS — K219 Gastro-esophageal reflux disease without esophagitis: Secondary | ICD-10-CM

## 2021-12-02 DIAGNOSIS — F411 Generalized anxiety disorder: Secondary | ICD-10-CM

## 2021-12-15 ENCOUNTER — Inpatient Hospital Stay (HOSPITAL_COMMUNITY)
Admission: EM | Admit: 2021-12-15 | Discharge: 2021-12-19 | DRG: 372 | Disposition: A | Discharge status: Home / Self Care | Payer: Medicare Other | Attending: Family Medicine | Admitting: Family Medicine

## 2021-12-15 ENCOUNTER — Other Ambulatory Visit: Payer: Self-pay

## 2021-12-15 ENCOUNTER — Encounter (HOSPITAL_COMMUNITY): Payer: Self-pay | Admitting: Emergency Medicine

## 2021-12-15 ENCOUNTER — Emergency Department (HOSPITAL_COMMUNITY): Payer: Medicare Other

## 2021-12-15 DIAGNOSIS — R1013 Epigastric pain: Secondary | ICD-10-CM | POA: Diagnosis not present

## 2021-12-15 DIAGNOSIS — Z86718 Personal history of other venous thrombosis and embolism: Secondary | ICD-10-CM

## 2021-12-15 DIAGNOSIS — Z833 Family history of diabetes mellitus: Secondary | ICD-10-CM

## 2021-12-15 DIAGNOSIS — I10 Essential (primary) hypertension: Secondary | ICD-10-CM | POA: Diagnosis present

## 2021-12-15 DIAGNOSIS — Z7901 Long term (current) use of anticoagulants: Secondary | ICD-10-CM | POA: Diagnosis not present

## 2021-12-15 DIAGNOSIS — D6859 Other primary thrombophilia: Secondary | ICD-10-CM | POA: Diagnosis present

## 2021-12-15 DIAGNOSIS — E039 Hypothyroidism, unspecified: Secondary | ICD-10-CM | POA: Diagnosis present

## 2021-12-15 DIAGNOSIS — Z79899 Other long term (current) drug therapy: Secondary | ICD-10-CM

## 2021-12-15 DIAGNOSIS — Z800 Family history of malignant neoplasm of digestive organs: Secondary | ICD-10-CM

## 2021-12-15 DIAGNOSIS — K55069 Acute infarction of intestine, part and extent unspecified: Secondary | ICD-10-CM | POA: Diagnosis present

## 2021-12-15 DIAGNOSIS — Z20822 Contact with and (suspected) exposure to covid-19: Secondary | ICD-10-CM | POA: Diagnosis present

## 2021-12-15 DIAGNOSIS — F418 Other specified anxiety disorders: Secondary | ICD-10-CM | POA: Diagnosis present

## 2021-12-15 DIAGNOSIS — Z79890 Hormone replacement therapy (postmenopausal): Secondary | ICD-10-CM

## 2021-12-15 DIAGNOSIS — E559 Vitamin D deficiency, unspecified: Secondary | ICD-10-CM | POA: Diagnosis present

## 2021-12-15 DIAGNOSIS — K8301 Primary sclerosing cholangitis: Secondary | ICD-10-CM | POA: Diagnosis present

## 2021-12-15 DIAGNOSIS — K3580 Unspecified acute appendicitis: Secondary | ICD-10-CM | POA: Diagnosis present

## 2021-12-15 DIAGNOSIS — E782 Mixed hyperlipidemia: Secondary | ICD-10-CM | POA: Diagnosis present

## 2021-12-15 DIAGNOSIS — K3589 Other acute appendicitis without perforation or gangrene: Principal | ICD-10-CM | POA: Diagnosis present

## 2021-12-15 DIAGNOSIS — Z87891 Personal history of nicotine dependence: Secondary | ICD-10-CM | POA: Diagnosis not present

## 2021-12-15 DIAGNOSIS — E890 Postprocedural hypothyroidism: Secondary | ICD-10-CM | POA: Diagnosis not present

## 2021-12-15 DIAGNOSIS — K219 Gastro-esophageal reflux disease without esophagitis: Secondary | ICD-10-CM | POA: Diagnosis present

## 2021-12-15 LAB — URINALYSIS, ROUTINE W REFLEX MICROSCOPIC
Bilirubin Urine: NEGATIVE
Glucose, UA: NEGATIVE mg/dL
Hgb urine dipstick: NEGATIVE
Ketones, ur: NEGATIVE mg/dL
Leukocytes,Ua: NEGATIVE
Nitrite: NEGATIVE
Protein, ur: 30 mg/dL — AB
Specific Gravity, Urine: 1.021 (ref 1.005–1.030)
pH: 9 — ABNORMAL HIGH (ref 5.0–8.0)

## 2021-12-15 LAB — COMPREHENSIVE METABOLIC PANEL
ALT: 92 U/L — ABNORMAL HIGH (ref 0–44)
AST: 85 U/L — ABNORMAL HIGH (ref 15–41)
Albumin: 4.8 g/dL (ref 3.5–5.0)
Alkaline Phosphatase: 44 U/L (ref 38–126)
Anion gap: 10 (ref 5–15)
BUN: 12 mg/dL (ref 6–20)
CO2: 25 mmol/L (ref 22–32)
Calcium: 9.4 mg/dL (ref 8.9–10.3)
Chloride: 100 mmol/L (ref 98–111)
Creatinine, Ser: 1.11 mg/dL (ref 0.61–1.24)
GFR, Estimated: >60 mL/min (ref >60)
Glucose, Bld: 120 mg/dL — ABNORMAL HIGH (ref 70–99)
Potassium: 4.4 mmol/L (ref 3.5–5.1)
Sodium: 135 mmol/L (ref 135–145)
Total Bilirubin: 1.2 mg/dL (ref 0.3–1.2)
Total Protein: 8.4 g/dL — ABNORMAL HIGH (ref 6.5–8.1)

## 2021-12-15 LAB — CBC
HCT: 41.5 % (ref 39.0–52.0)
Hemoglobin: 13.9 g/dL (ref 13.0–17.0)
MCH: 31 pg (ref 26.0–34.0)
MCHC: 33.5 g/dL (ref 30.0–36.0)
MCV: 92.4 fL (ref 80.0–100.0)
Platelets: 183 10*3/uL (ref 150–400)
RBC: 4.49 MIL/uL (ref 4.22–5.81)
RDW: 14.1 % (ref 11.5–15.5)
WBC: 12.4 10*3/uL — ABNORMAL HIGH (ref 4.0–10.5)
nRBC: 0 % (ref 0.0–0.2)

## 2021-12-15 LAB — RESP PANEL BY RT-PCR (FLU A&B, COVID) ARPGX2
Influenza A by PCR: NEGATIVE
Influenza B by PCR: NEGATIVE
SARS Coronavirus 2 by RT PCR: NEGATIVE

## 2021-12-15 LAB — LIPASE, BLOOD: Lipase: 28 U/L (ref 11–51)

## 2021-12-15 MED ORDER — METRONIDAZOLE 500 MG/100ML IV SOLN
500.0000 mg | Freq: Two times a day (BID) | INTRAVENOUS | Status: DC
  Administered 2021-12-16 – 2021-12-18 (×5): 500 mg via INTRAVENOUS
  Filled 2021-12-15 (×5): qty 100

## 2021-12-15 MED ORDER — BISOPROLOL-HYDROCHLOROTHIAZIDE 10-6.25 MG PO TABS
1.0000 | ORAL_TABLET | Freq: Every day | ORAL | Status: DC
  Administered 2021-12-16 – 2021-12-18 (×3): 1 via ORAL
  Filled 2021-12-15 (×4): qty 1

## 2021-12-15 MED ORDER — SODIUM CHLORIDE 0.9 % IV SOLN
2.0000 g | Freq: Once | INTRAVENOUS | Status: AC
  Administered 2021-12-15: 2 g via INTRAVENOUS
  Filled 2021-12-15: qty 20

## 2021-12-15 MED ORDER — HEPARIN (PORCINE) 25000 UT/250ML-% IV SOLN
1950.0000 [IU]/h | INTRAVENOUS | Status: DC
  Administered 2021-12-15: 1900 [IU]/h via INTRAVENOUS
  Administered 2021-12-16: 2000 [IU]/h via INTRAVENOUS
  Administered 2021-12-17: 1950 [IU]/h via INTRAVENOUS
  Filled 2021-12-15 (×3): qty 250

## 2021-12-15 MED ORDER — ONDANSETRON 4 MG PO TBDP
4.0000 mg | ORAL_TABLET | Freq: Once | ORAL | Status: AC
  Administered 2021-12-15: 4 mg via ORAL
  Filled 2021-12-15: qty 1

## 2021-12-15 MED ORDER — LACTATED RINGERS IV SOLN: INTRAVENOUS | Status: AC

## 2021-12-15 MED ORDER — IOHEXOL 300 MG/ML  SOLN
100.0000 mL | Freq: Once | INTRAMUSCULAR | Status: AC | PRN
  Administered 2021-12-15: 100 mL via INTRAVENOUS

## 2021-12-15 MED ORDER — ONDANSETRON HCL 4 MG/2ML IJ SOLN: 4.0000 mg | Freq: Four times a day (QID) | INTRAMUSCULAR | Status: DC | PRN

## 2021-12-15 MED ORDER — MORPHINE SULFATE (PF) 4 MG/ML IV SOLN
4.0000 mg | Freq: Once | INTRAVENOUS | Status: AC
  Administered 2021-12-15: 4 mg via INTRAVENOUS
  Filled 2021-12-15: qty 1

## 2021-12-15 MED ORDER — LEVOTHYROXINE SODIUM 100 MCG PO TABS
100.0000 ug | ORAL_TABLET | Freq: Every day | ORAL | Status: DC
  Administered 2021-12-16 – 2021-12-19 (×4): 100 ug via ORAL
  Filled 2021-12-15 (×4): qty 1

## 2021-12-15 MED ORDER — HYDROXYZINE HCL 25 MG PO TABS: 50.0000 mg | ORAL_TABLET | Freq: Three times a day (TID) | ORAL | Status: DC | PRN

## 2021-12-15 MED ORDER — ARIPIPRAZOLE 5 MG PO TABS
10.0000 mg | ORAL_TABLET | Freq: Every day | ORAL | Status: DC
  Administered 2021-12-16 – 2021-12-18 (×3): 10 mg via ORAL
  Filled 2021-12-15 (×4): qty 2

## 2021-12-15 MED ORDER — MORPHINE SULFATE (PF) 2 MG/ML IV SOLN
2.0000 mg | Freq: Once | INTRAVENOUS | Status: AC
  Administered 2021-12-15: 2 mg via INTRAVENOUS
  Filled 2021-12-15: qty 1

## 2021-12-15 MED ORDER — ONDANSETRON HCL 4 MG/2ML IJ SOLN
4.0000 mg | Freq: Once | INTRAMUSCULAR | Status: AC
  Administered 2021-12-15: 4 mg via INTRAVENOUS
  Filled 2021-12-15: qty 2

## 2021-12-15 MED ORDER — MORPHINE SULFATE (PF) 2 MG/ML IV SOLN
2.0000 mg | INTRAVENOUS | Status: DC | PRN
  Administered 2021-12-15 – 2021-12-19 (×12): 2 mg via INTRAVENOUS
  Filled 2021-12-15 (×13): qty 1

## 2021-12-15 MED ORDER — SERTRALINE HCL 100 MG PO TABS
100.0000 mg | ORAL_TABLET | Freq: Every day | ORAL | Status: DC
  Administered 2021-12-16 – 2021-12-18 (×3): 100 mg via ORAL
  Filled 2021-12-15 (×4): qty 1

## 2021-12-15 MED ORDER — SODIUM CHLORIDE 0.9 % IV SOLN
2.0000 g | INTRAVENOUS | Status: DC
  Administered 2021-12-16 – 2021-12-17 (×2): 2 g via INTRAVENOUS
  Filled 2021-12-15 (×2): qty 20

## 2021-12-15 MED ORDER — OXYCODONE HCL 5 MG PO TABS
10.0000 mg | ORAL_TABLET | ORAL | Status: DC | PRN
  Administered 2021-12-15 – 2021-12-18 (×6): 10 mg via ORAL
  Filled 2021-12-15 (×6): qty 2

## 2021-12-15 MED ORDER — METRONIDAZOLE 500 MG/100ML IV SOLN
500.0000 mg | Freq: Once | INTRAVENOUS | Status: AC
  Administered 2021-12-15: 500 mg via INTRAVENOUS
  Filled 2021-12-15: qty 100

## 2021-12-15 NOTE — Consult Note (Signed)
CC: Abdominal pain  Requesting provider: Carlus Pavlov, PA  HPI: Kyle Gonzales is an 31 y.o. male who is here for abdominal pain.  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  He is followed by GI and hepatology.  His last colonoscopy was 2018 by Dr. Havery Moros.  He just saw Dr. Havery Moros recently and is in the process to be scheduled for surveillance colonoscopy.  He was admitted in late July 2021 with abdominal pain with a diagnosis of appendicitis.  He was taken the following day for laparoscopic appendectomy.  At that time he was found to have severely perforated appendicitis and underwent laparoscopic debridement of abscess containing pus and thick fibrinous exudates.  There was a tubular appearing structure that was stapled off at the time of surgery but given the significant distorted anatomy it was unclear whether or not that was truly the appendix.  On final pathology report they did not identify a tubular structure per se.  He eventually recovered and was discharged in early August.  He also has a remote history of laparoscopic cholecystectomy at outside hospital.  He had previously had to undergo ERCP because of an intrahepatic dilatation with stricture noted and underwent brushings and dilatation.  Brushings of the stricture were benign.  He is followed by the local hepatology clinic.  He states that around 10:30 AM he developed abdominal pain and points to his umbilical area and slightly below it.  He got nauseous and he states that he made himself vomit.  He also reports subjective fevers and chills.  He reports normal bowel movements.  Last dose of Eliquis was this morning.  No dysuria or hematuria.  No melena hematochezia.  No diarrhea or constipation.  Past Medical History:  Diagnosis Date   GERD (gastroesophageal reflux disease)    Graves disease 08/18/2015   Hyperlipidemia     Hypothyroid    Palpitations    PSC (primary sclerosing cholangitis)    Thrombosis    superior mesenteric vein - 2019   Vitamin D deficiency     Past Surgical History:  Procedure Laterality Date   ANTERIOR CRUCIATE LIGAMENT REPAIR  2012   ERCP N/A 08/19/2018   Procedure: ENDOSCOPIC RETROGRADE CHOLANGIOPANCREATOGRAPHY (ERCP);  Surgeon: Milus Banister, MD;  Location: [facility];  Service: Endoscopy;  Laterality: N/A;   LAPAROSCOPIC APPENDECTOMY N/A 05/11/2020   Procedure: APPENDECTOMY LAPAROSCOPIC;  Surgeon: Johnathan Hausen, MD;  Location: [facility];  Service: General;  Laterality: N/A;   REMOVAL OF STONES  08/19/2018   Procedure: REMOVAL OF STONES;  Surgeon: Milus Banister, MD;  Location: [facility];  Service: Endoscopy;;   SPHINCTEROTOMY  08/19/2018   Procedure: Joan Mayans;  Surgeon: Milus Banister, MD;  Location: [facility];  Service: Endoscopy;;   WISDOM TOOTH EXTRACTION      Family History  Problem Relation Age of Onset   Diabetes Mother    Colon cancer Maternal Grandmother    Stomach cancer Maternal Grandmother    Diabetes Maternal Grandmother    Stomach cancer Maternal Grandfather    Diabetes Maternal Grandfather     Social:  reports that he quit smoking about 6 weeks ago. His smoking use included cigarettes. He has never used smokeless tobacco. He reports that he does not currently use drugs after having used the following drugs: Marijuana. He reports that he does not drink alcohol.  Allergies:  Allergies  Allergen Reactions   Tylenol [Acetaminophen]  Other (See Comments)    r/t elevated LFTs     Medications: I have reviewed the patient's current medications.   ROS - all of the below systems have been reviewed with the patient and positives are indicated with bold text General: chills, fever or night sweats Eyes: blurry vision or double vision ENT: epistaxis or sore throat Allergy/Immunology: itchy/watery eyes or nasal congestion Hematologic/Lymphatic:  bleeding problems, blood clots or swollen lymph nodes Endocrine: temperature intolerance or unexpected weight changes Breast: new or changing breast lumps or nipple discharge Resp: cough, shortness of breath, or wheezing CV: chest pain or dyspnea on exertion GI: as per HPI GU: dysuria, trouble voiding, or hematuria MSK: joint pain or joint stiffness Neuro: TIA or stroke symptoms Derm: pruritus and skin lesion changes Psych: anxiety and depression  PE Blood pressure (!) 149/86, pulse 77, temperature 98.4 F (36.9 C), temperature source Oral, resp. rate 18, SpO2 99 %. Constitutional: NAD; conversant; no deformities Eyes: Moist conjunctiva; no lid lag; anicteric; PERRL Neck: Trachea midline; no thyromegaly Lungs: Normal respiratory effort; no tactile fremitus CV: RRR; no palpable thrills; no pitting edema GI: Abd multiple old trocar scars soft, nondistended, mild tenderness to the right and slightly below the umbilicus.  Definitely no rebound or guarding; no palpable hepatosplenomegaly MSK: Normal gait; no clubbing/cyanosis Psychiatric: Appropriate affect; alert and oriented x3 Lymphatic: No palpable cervical or axillary lymphadenopathy Skin: No rash, jaundice or lesions  Results for orders placed or performed during the hospital encounter of 12/15/21 (from the past 48 hour(s))  Lipase, blood     Status: None   Collection Time: 12/15/21  1:15 PM  Result Value Ref Range   Lipase 28 11 - 51 U/L    Comment: Performed at [facility], [place] [address], [place], [place] [address]  Comprehensive metabolic panel     Status: Abnormal   Collection Time: 12/15/21  1:15 PM  Result Value Ref Range   Sodium 135 135 - 145 mmol/L   Potassium 4.4 3.5 - 5.1 mmol/L   Chloride 100 98 - 111 mmol/L   CO2 25 22 - 32 mmol/L   Glucose, Bld 120 (H) 70 - 99 mg/dL    Comment: Glucose reference range applies only to samples taken after fasting for at least 8 hours.   BUN 12 6 - 20 mg/dL    Creatinine, Ser 1.11 0.61 - 1.24 mg/dL   Calcium 9.4 8.9 - 10.3 mg/dL   Total Protein 8.4 (H) 6.5 - 8.1 g/dL   Albumin 4.8 3.5 - 5.0 g/dL   AST 85 (H) 15 - 41 U/L   ALT 92 (H) 0 - 44 U/L   Alkaline Phosphatase 44 38 - 126 U/L   Total Bilirubin 1.2 0.3 - 1.2 mg/dL   GFR, Estimated >60 >60 mL/min    Comment: (NOTE) Calculated using the CKD-EPI Creatinine Equation (2021)    Anion gap 10 5 - 15    Comment: Performed at [facility], [place] [address], [place], [place] [address]  CBC     Status: Abnormal   Collection Time: 12/15/21  1:15 PM  Result Value Ref Range   WBC 12.4 (H) 4.0 - 10.5 K/uL   RBC 4.49 4.22 - 5.81 MIL/uL   Hemoglobin 13.9 13.0 - 17.0 g/dL   HCT 41.5 39.0 - 52.0 %   MCV 92.4 80.0 - 100.0 fL   MCH 31.0 26.0 - 34.0 pg   MCHC 33.5 30.0 - 36.0 g/dL   RDW 14.1  11.5 - 15.5 %   Platelets 183 150 - 400 K/uL   nRBC 0.0 0.0 - 0.2 %    Comment: Performed at [facility], [place] [address] [place], [place] [address]  Urinalysis, Routine w reflex microscopic Urine, Clean Catch     Status: Abnormal   Collection Time: 12/15/21  1:15 PM  Result Value Ref Range   Color, Urine YELLOW YELLOW   APPearance CLEAR CLEAR   Specific Gravity, Urine 1.021 1.005 - 1.030   pH 9.0 (H) 5.0 - 8.0   Glucose, UA NEGATIVE NEGATIVE mg/dL   Hgb urine dipstick NEGATIVE NEGATIVE   Bilirubin Urine NEGATIVE NEGATIVE   Ketones, ur NEGATIVE NEGATIVE mg/dL   Protein, ur 30 (A) NEGATIVE mg/dL   Nitrite NEGATIVE NEGATIVE   Leukocytes,Ua NEGATIVE NEGATIVE   RBC / HPF 11-20 0 - 5 RBC/hpf   WBC, UA 0-5 0 - 5 WBC/hpf   Bacteria, UA RARE (A) NONE SEEN    Comment: Performed at [facility], [place] [address] [place],  [address]  Resp Panel by RT-PCR (Flu A&B, Covid) Nasopharyngeal Swab     Status: None   Collection Time: 12/15/21  1:18 PM   Specimen: Nasopharyngeal Swab; Nasopharyngeal(NP) swabs in vial transport medium  Result Value Ref Range    SARS Coronavirus 2 by RT PCR NEGATIVE NEGATIVE    Comment: (NOTE) SARS-CoV-2 target nucleic acids are NOT DETECTED.  The SARS-CoV-2 RNA is generally detectable in upper respiratory specimens during the acute phase of infection. The lowest concentration of SARS-CoV-2 viral copies this assay can detect is 138 copies/mL. A negative result does not preclude SARS-Cov-2 infection and should not be used as the sole basis for treatment or other patient management decisions. A negative result may occur with  improper specimen collection/handling, submission of specimen other than nasopharyngeal swab, presence of viral mutation(s) within the areas targeted by this assay, and inadequate number of viral copies(<138 copies/mL). A negative result must be combined with clinical observations, patient history, and epidemiological information. The expected result is Negative.  Fact Sheet for Patients:  EntrepreneurPulse.com.au  Fact Sheet for Healthcare Providers:  IncredibleEmployment.be  This test is no t yet approved or cleared by the [place] FDA and  has been authorized for detection and/or diagnosis of SARS-CoV-2 by FDA under an Emergency Use Authorization (EUA). This EUA will remain  in effect (meaning this test can be used) for the duration of the COVID-19 declaration under Section 564(b)(1) of the Act, 21 U.S.C.section 360bbb-3(b)(1), unless the authorization is terminated  or revoked sooner.       Influenza A by PCR NEGATIVE NEGATIVE   Influenza B by PCR NEGATIVE NEGATIVE    Comment: (NOTE) The Xpert Xpress SARS-CoV-2/FLU/RSV plus assay is intended as an aid in the diagnosis of influenza from Nasopharyngeal swab specimens and should not be used as a sole basis for treatment. Nasal washings and aspirates are unacceptable for Xpert Xpress SARS-CoV-2/FLU/RSV testing.  Fact Sheet for Patients: EntrepreneurPulse.com.au  Fact  Sheet for Healthcare Providers: IncredibleEmployment.be  This test is not yet approved or cleared by the [place] FDA and has been authorized for detection and/or diagnosis of SARS-CoV-2 by FDA under an Emergency Use Authorization (EUA). This EUA will remain in effect (meaning this test can be used) for the duration of the COVID-19 declaration under Section 564(b)(1) of the Act, 21 U.S.C. section 360bbb-3(b)(1), unless the authorization is terminated or revoked.  Performed at [facility], [place]  [address] [place], [place] [address]     CT ABDOMEN PELVIS W CONTRAST  Result Date: 12/15/2021 CLINICAL DATA:  Epigastric pain, nausea, vomiting. Previous cholecystectomy EXAM: CT ABDOMEN AND PELVIS WITH CONTRAST TECHNIQUE: Multidetector CT imaging of the abdomen and pelvis was performed using the standard protocol following bolus administration of intravenous contrast. RADIATION DOSE REDUCTION: This exam was performed according to the departmental dose-optimization program which includes automated exposure control, adjustment of the mA and/or kV according to patient size and/or use of iterative reconstruction technique. CONTRAST:  145mL OMNIPAQUE IOHEXOL 300 MG/ML  SOLN COMPARISON:  MR 10/22/2021 and previous FINDINGS: Lower chest: No pleural or pericardial effusion. Hepatobiliary: Stable moderate central intrahepatic biliary ductal dilatation. The CBD is normal in caliber. 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 2.9 cm subcapsular hemangioma in hepatic segment 5/6, and 1.8 cm subcapsular lesion in segment 5, previously characterized as hemangioma on MRI. No new liver lesion. Post cholecystectomy. Pancreas: Unremarkable. No pancreatic ductal dilatation or surrounding inflammatory changes. Spleen: Normal in size without focal abnormality. Adrenals/Urinary Tract: Adrenal glands are unremarkable. Kidneys are normal,  without renal calculi, focal lesion, or hydronephrosis. Bladder is unremarkable. Stomach/Bowel: Stomach is distended by ingested material. Small bowel decompressed. The colon is nondilated. Appendix: Location: Posteromedial to cecum extending towards midline Diameter: 1.7 cm Appendicolith: None seen Mucosal hyper-enhancement: Mild, near the base Extraluminal gas: None Periappendiceal collection: None. Mild regional inflammatory changes Vascular/Lymphatic: Chronic cavernous transformation of portal vein. No arterial pathology identified. No abdominal or pelvic adenopathy localized. Reproductive: Prostate is unremarkable. Other: No ascites.  No free air. Musculoskeletal: No acute findings. IMPRESSION: 1. CT findings of acute appendicitis, without perforation or abscess. Critical Value/emergent results were called by telephone at the time of interpretation on 12/15/2021 at 4:43 pm to provider [facility] , who verbally acknowledged these results. 2. 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 3. Benign hepatic hemangiomas again noted. Electronically Signed   By: Lucrezia Europe M.D.   On: 12/15/2021 16:45    Imaging: Personally reviewed his CT scan from today as well as the CT scan from September 2021.  Data reviewed: I reviewed his H&P and discharge summary and operative note from July 2021.  I reviewed his most recent GI office note.  I reviewed his colonoscopy from 2018.  I reviewed the imaging as described above.  I reviewed his ER notes & labs over the past 6 weeks. A/P: Kyle Gonzales is an 31 y.o. male with  Right lower quadrant pain with nausea and vomiting Probable appendicitis Protein S deficiency on chronic Eliquis History of SMV thrombosis PSC Hypertension History of Graves' disease status post elation with resultant hypothyroidism Elevated transaminases Mild leukocytosis  Plan- Admit to medicine service IV  antibiotic Hold Eliquis Bridge with IV heparin once Eliquis out of system given his history of protein S deficiency Can have sips of clear Repeat labs including LFTs in the morning  I believe he needs to be medically managed during this admission for his appendicitis.  His CT scan does show that his appendix is dilated compared to a CT scan in September 2021 but there is not a lot of inflammation.  In reviewing the operative note from July 2021 he had obvious extensive perforated appendicitis with pus and distorted anatomy and a tubular appearing structure was resected.  On final pathology report there was no tubular structure identified so I think he just had probably part of  the abscess cavity debrided and resected and he still has his full appendix intact.  However there is a staple line in the right lower quadrant.  The plane between the right ureter and the appendix is very difficult to visualize and I am not sure if there is a straightforward plane.  I think he would be at moderate risk for ileocecectomy  He also needs an updated colonoscopy to rule out underlying inflammatory bowel disease  Ideally, his appendicitis would be managed with antibiotics and nonsurgical management during this admission.  Followed by outpatient colonoscopy to rule out underlying colonic pathology and then follow-up in our office with one of our colorectal surgeons to discuss appendectomy with possible ileocecectomy with probable ureteral stent placement by urology  We did discuss that sometimes medical management of appendicitis does not work.  All of this was discussed with the patient.  I drew a diagram.  This required a high level of medical decision making due to significant review of medical records, complexity of the patient's surgical history as well as his comorbidities discussion of the case with ER, hospitalist and 2 of my surgical colleagues  Leighton Ruff. Redmond Pulling, MD, FACS General, Bariatric, & Minimally  Invasive Surgery [facility], [place]

## 2021-12-15 NOTE — H&P (Signed)
History and Physical    Kyle Gonzales IDP:[id number] DOB: Dec 15, 1990 DOA: 12/15/2021  PCP: [name], MD  Patient coming from: Home  I have personally briefly reviewed patient's old medical records in [place]  Chief Complaint: Abdominal pain  HPI: Kyle Gonzales is a 31 y.o. 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  Patient reports developing severe abdominal pain around 10:30 AM on 12/15/2021.  Pain initially at the umbilical area, now in the right lower quadrant.  He says he developed nausea and made himself vomit.  He has had subjective fevers, chills, diaphoresis.  He reports normal bowel movements, denies dysuria.  He reports adherence to Eliquis, last dose taken morning of 12/15/2021.  ED Course   Labs/Imaging on admission: I have personally reviewed following labs and imaging studies.  Initial vitals showed BP 142/81, pulse 89, RR 16, temp 98.4 F, SPO2 98% on room air.  Labs show WBC 12.4, hemoglobin 13.9, platelets 183,000, sodium 135, potassium 4.4, bicarb 25, BUN 12, creatinine 1.11, serum glucose 120, AST 85 ALT 92, alk phos 44, total bilirubin 1.2, lipase 28.  SARS-CoV-2 PCR and influenza PCR negative.  CT abdomen/pelvis with contrast showed findings of acute appendicitis without perforation or abscess.  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  Benign hepatic hemangiomas again noted.  Patient was given IV ceftriaxone and Flagyl, IV morphine.  EDP discussed with on-call general surgery who will consult and requested medical admission.  The hospitalist service was consulted to admit for further  evaluation and management.  Review of Systems: All systems reviewed and are negative except as documented in history of present illness above.   Past Medical History:  Diagnosis Date   GERD (gastroesophageal reflux disease)    Graves disease 08/18/2015   Hyperlipidemia    Hypothyroid    Palpitations    PSC (primary sclerosing cholangitis)    Thrombosis    superior mesenteric vein - 2019   Vitamin D deficiency     Past Surgical History:  Procedure Laterality Date   ANTERIOR CRUCIATE LIGAMENT REPAIR  2012   ERCP N/A 08/19/2018   Procedure: ENDOSCOPIC RETROGRADE CHOLANGIOPANCREATOGRAPHY (ERCP);  Surgeon: Milus Banister, MD;  Location: [facility];  Service: Endoscopy;  Laterality: N/A;   LAPAROSCOPIC APPENDECTOMY N/A 05/11/2020   Procedure: APPENDECTOMY LAPAROSCOPIC;  Surgeon: Johnathan Hausen, MD;  Location: [facility];  Service: General;  Laterality: N/A;   REMOVAL OF STONES  08/19/2018   Procedure: REMOVAL OF STONES;  Surgeon: Milus Banister, MD;  Location: [facility];  Service: Endoscopy;;   SPHINCTEROTOMY  08/19/2018   Procedure: Joan Mayans;  Surgeon: Milus Banister, MD;  Location: [facility];  Service: Endoscopy;;   WISDOM TOOTH EXTRACTION      Social History:  reports that he quit smoking about 6 weeks ago. His smoking use included cigarettes. He has never used smokeless tobacco. He reports that he does not currently use drugs after having used the following drugs: Marijuana. He reports that he does not drink alcohol.  Allergies  Allergen Reactions   Tylenol [Acetaminophen] Other (See Comments)    r/t elevated LFTs     Family History  Problem Relation Age of Onset   Diabetes Mother    Colon cancer  Maternal Grandmother    Stomach cancer Maternal Grandmother    Diabetes Maternal Grandmother    Stomach cancer Maternal Grandfather    Diabetes Maternal Grandfather      Prior to Admission medications   Medication Sig Start Date End Date Taking? Authorizing  Provider  amLODipine (NORVASC) 2.5 MG tablet Take 1 tablet (2.5 mg total) by mouth daily. 05/19/20   Guilford Shi, MD  apixaban (ELIQUIS) 5 MG TABS tablet TAKE 1 TABLET(5 MG) BY MOUTH TWICE DAILY Patient taking differently: Take 5 mg by mouth 2 (two) times daily. 01/13/19   [name], MD  ARIPiprazole (ABILIFY) 10 MG tablet Take 1 tablet (10 mg total) by mouth daily. 10/05/21   Salley Slaughter, NP  bisoprolol-hydrochlorothiazide [facility]) 10-6.25 MG tablet LAST REFILL - Take 1 tablet Daily for BP - ABSOLUTELY  Must have Office Visit before Refill 03/07/21   [name], MD  Cholecalciferol (VITAMIN D3) 5000 units CAPS Take 1 capsule (5,000 Units total) by mouth every other day. Patient taking differently: Take 5,000 Units by mouth daily. 05/02/18   Elgergawy, Silver Huguenin, MD  ezetimibe (ZETIA) 10 MG tablet Take  1 tablet  Daily  for Cholesterol 11/26/21   [name], MD  hydrOXYzine (ATARAX) 50 MG tablet TAKE ONE TABLET BY MOUTH THREE TIMES A DAY AS NEEDED FOR ANXIETY 12/05/21   Salley Slaughter, NP  levothyroxine (SYNTHROID) 100 MCG tablet Take 1 tablet   Daily   on an empty stomach with only water for 30 minutes & no Antacid meds, Calcium or Magnesium for 4 hours & avoid Biotin 11/26/21   [name], MD  omeprazole (PRILOSEC) 40 MG capsule TAKE ONE CAPSULE BY MOUTH DAILY **KEEP DOCTOR APPOINTMENT FOR FURTHER REFILLS** 12/02/21   Armbruster, Carlota Raspberry, MD  rosuvastatin (CRESTOR) 10 MG tablet Take 1 tablet Daily for Cholesterol 11/26/21   [name], MD  sertraline (ZOLOFT) 100 MG tablet Take 1.5 tablets (150 mg total) by mouth daily. Patient taking differently: Take 150 mg by mouth daily. Takes as needed 10/05/21   Salley Slaughter, NP  tadalafil (CIALIS) 20 MG tablet Take  1/2 to 1 tablet  every 2 to 3 days  as needed for XXXX 02/18/21   [name], MD  traZODone (DESYREL) 100 MG tablet TAKE 1 TABLET (100 MG TOTAL) BY MOUTH AT BEDTIME. Patient taking differently:  Take by mouth at bedtime. As needed 10/05/21   Salley Slaughter, NP    Physical Exam: Vitals:   12/15/21 1948 12/15/21 1949 12/15/21 2029 12/15/21 2100  BP: (!) 143/84  139/72   Pulse: 81 82 76   Resp: 20  18   Temp: 98.4 F (36.9 C)  98 F (36.7 C)   TempSrc: Oral  Oral   SpO2: 99% 99% 97%   Weight:    126.3 kg  Height:    6' 3"  (1.905 m)   Constitutional: Resting in bed in the right lateral decubitus position, appears tired and somewhat uncomfortable Eyes: PERRL, lids and conjunctivae normal ENMT: Mucous membranes are dry. Posterior pharynx clear of any exudate or lesions.Normal dentition.  Neck: normal, supple, no masses. Respiratory: clear to auscultation bilaterally, no wheezing, no crackles. Normal respiratory effort. No accessory muscle use.  Cardiovascular: Regular rate and rhythm, no murmurs / rubs / gallops. No extremity edema. 2+ pedal pulses. Abdomen: Mild RLQ tenderness, no masses palpated. No hepatosplenomegaly. Musculoskeletal: no clubbing / cyanosis. No joint deformity upper and lower extremities. Good ROM, no contractures. Normal muscle tone.  Skin: no rashes, lesions, ulcers. No induration Neurologic: CN 2-12 grossly intact. Sensation intact. Strength 5/5 in all 4.  Psychiatric: Normal judgment and insight. Alert and oriented x 3. Normal mood.   EKG: Not performed.  Assessment/Plan Principal Problem:   Acute appendicitis Active Problems:   Primary sclerosing cholangitis   Mesenteric thrombosis (HCC)   Hypertension   Hypothyroidism   Hyperlipidemia, mixed   Depression with anxiety   Kyle Gonzales is a 31 y.o. 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  Assessment and Plan: * Acute  appendicitis CT imaging suggestive of acute appendicitis.  Has history of perforated appendicitis with abscess 04/2020.  Per general surgery, abscess cavity was debrided and resected and appendix was left intact at that time. -General surgery following for further management -Continue IV ceftriaxone and Flagyl -Holding Eliquis  Primary sclerosing cholangitis Follows with [facility] hepatology and [facility], Dr. Havery Moros.  Has mild elevation in AST and ALT. Chronic and postcholecystectomy changes noted on CT.  Mesenteric thrombosis (HCC) On chronic anticoagulation with Eliquis due to history of SMV thrombosis in setting of protein S deficiency. -Eliquis on hold -Start on IV heparin bridge per pharmacy  Hypertension Resume home bisoprolol-HCTZ.  Hypothyroidism Continue Synthroid.  Hyperlipidemia, mixed Rosuvastatin and Zetia on hold for now.  Depression with anxiety Continue Abilify, sertraline, Atarax as needed.  DVT prophylaxis:   Heparin drip per pharmacy Code Status: Full code Family Communication: Patient's aunt at bedside. Disposition Plan: From home and likely discharge to home pending further surgical recommendations. Consults called: General surgery Severity of Illness: The appropriate patient status for this patient is INPATIENT. Inpatient status is judged to be reasonable and necessary in order to provide the required intensity of service to ensure the patient's safety. The patient's presenting symptoms, physical exam findings, and initial radiographic and laboratory data in the context of their chronic comorbidities is felt to place them at high risk for further clinical deterioration. Furthermore, it is not anticipated that the patient will be medically stable for discharge from the hospital within 2 midnights of admission.   * I certify that at the point of admission it is my clinical judgment that the patient will require inpatient hospital care spanning beyond 2  midnights from the point of admission due to high intensity of service, high risk for further deterioration and high frequency of surveillance required.Zada Finders MD [facility]  If 7PM-7AM, please contact night-coverage [email or web address]  12/16/2021, 12:02 AM

## 2021-12-15 NOTE — ED Notes (Signed)
ED Provider at bedside. 

## 2021-12-15 NOTE — Assessment & Plan Note (Signed)
Follows with [facility] hepatology and [facility], Dr. Havery Moros.  Has mild elevation in AST and ALT. Chronic and postcholecystectomy changes noted on CT. ?

## 2021-12-15 NOTE — Assessment & Plan Note (Addendum)
--  CT imaging suggestive of acute appendicitis.  Has history of perforated appendicitis with abscess 04/2020.  ?--Continue empiric antibiotics, management per general surgery.  Conservative at this time.   ?--per surgery, diet advanced, can change to oral antibiotics and resume apixaban. If stable, home tomorrow per surgery. ?

## 2021-12-15 NOTE — ED Triage Notes (Signed)
Patient here from home reporting sharp abd pain with n/v that started 5 hours ago "all over", intermittent.  ?

## 2021-12-15 NOTE — Progress Notes (Signed)
ANTICOAGULATION CONSULT NOTE - Initial Consult ? 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 ?Allergies  ?Allergen Reactions  ? Tylenol [Acetaminophen] Other (See Comments)  ?  r/t elevated LFTs ?  ? ? ?Patient Measurements: ?Height: 6\' 3"  (190.5 cm) ?Weight: 126.3 kg (278 lb 7.1 oz) ?IBW/kg (Calculated) : 84.5 ?Heparin Dosing Weight: 112 kg ? ?Vital Signs: ?Temp: 98.4 ?F (36.9 ?C) (03/02 1948) ?Temp Source: Oral (03/02 1948) ?BP: 143/84 (03/02 1948) ?Pulse Rate: 82 (03/02 1949) ? ?Labs: ?Recent Labs  ?  12/15/21 ?1315  ?HGB 13.9  ?HCT 41.5  ?PLT 183  ?CREATININE 1.11  ? ? ?CrCl cannot be calculated (Unknown ideal weight.). ? ? ?Medical History: ?Past Medical History:  ?Diagnosis Date  ? GERD (gastroesophageal reflux disease)   ? Graves disease 08/18/2015  ? Hyperlipidemia   ? Hypothyroid   ? Palpitations   ? PSC (primary sclerosing cholangitis)   ? 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 Vitamin D deficiency   ? ? ?Medications:  ?Medications Prior to Admission  ?Medication Sig Dispense Refill Last Dose  ? amLODipine (NORVASC) 2.5 MG tablet Take 1 tablet (2.5 mg total) by mouth daily. 30 tablet 1   ? apixaban (ELIQUIS) 5 MG TABS tablet TAKE 1 TABLET(5 MG) BY MOUTH TWICE DAILY (Patient taking differently: Take 5 mg by mouth 2 (two) times daily.) 180 tablet 0   ? ARIPiprazole (ABILIFY) 10 MG tablet Take 1 tablet (10 mg total) by mouth daily. 30 tablet 3   ? bisoprolol-hydrochlorothiazide (ZIAC) 10-6.25 MG tablet LAST REFILL - Take 1 tablet Daily for BP - ABSOLUTELY  Must have Office Visit before Refill 7 tablet 0   ? Cholecalciferol (VITAMIN D3) 5000 units CAPS Take 1 capsule (5,000 Units total) by mouth every other day. (Patient taking differently: Take 5,000 Units by mouth daily.)     ? ezetimibe (ZETIA) 10 MG tablet Take  1 tablet  Daily  for Cholesterol 90 tablet 1   ? hydrOXYzine (ATARAX) 50 MG tablet TAKE ONE TABLET BY MOUTH THREE  TIMES A DAY AS NEEDED FOR ANXIETY 90 tablet 1   ? levothyroxine (SYNTHROID) 100 MCG tablet Take 1 tablet   Daily   on an empty stomach with only water for 30 minutes & no Antacid meds, Calcium or Magnesium for 4 hours & avoid Biotin 90 tablet 1   ? omeprazole (PRILOSEC) 40 MG capsule TAKE ONE CAPSULE BY MOUTH DAILY **KEEP DOCTOR APPOINTMENT FOR FURTHER REFILLS** 30 capsule 1   ? rosuvastatin (CRESTOR) 10 MG tablet Take 1 tablet Daily for Cholesterol 90 tablet 1   ? sertraline (ZOLOFT) 100 MG tablet Take 1.5 tablets (150 mg total) by mouth daily. (Patient taking differently: Take 150 mg by mouth daily. Takes as needed) 45 tablet 3   ? tadalafil (CIALIS) 20 MG tablet Take  1/2 to 1 tablet  every 2 to 3 days  as needed for XXXX 30 tablet 0   ? traZODone (DESYREL) 100 MG tablet TAKE 1 TABLET (100 MG TOTAL) BY MOUTH AT BEDTIME. (Patient taking differently: Take by mouth at bedtime. As needed) 30 tablet 3   ? ?Scheduled:  ?Infusions:  ? [START ON 12/16/2021] cefTRIAXone (ROCEPHIN)  IV    ? And  ? [START ON 12/16/2021] metronidazole    ? lactated ringers    ? ? ?Assessment: ?5 yoM presented on 3/2 with abdominal pain, N/V.  PMH significant for previous appendicitis,  previous primary sclerosing cholangitis with gallbladder removal last year, and Protein S deficiency w/ hx SMV thrombosis on chronic Eliquis.  Eliquis is now held for possible procedures.  Pharmacy is consulted to dose Heparin IV.  ?Last apixaban dose on 3/2 morning ? ?Baseline coags pending, but expect heparin level to be falsely elevated d/t recent DOAC exposure.  Plan to use aPTT for heparin monitoring and dose titration. ?CBC:  Hgb and Plt WNL ?SCr 1.11 ? ?Goal of Therapy:  ?Heparin level 0.3-0.7 units/ml ?aPTT 66-102 seconds ?Monitor platelets by anticoagulation protocol: Yes ?  ?Plan:  ?No heparin bolus d/t recent apixaban use ?Start heparin IV infusion 1900 units/hr at 22:00 (~12 hours after last apixaban dose) ?aPTT 6 hours after starting ?Daily aPTT,  heparin level, and CBC ?Continue to monitor H&H and platelets ? ? ? ?Gretta Arab PharmD, BCPS ?Clinical Pharmacist ?[facility] [phone] ?12/15/2021 8:12 PM ? ?

## 2021-12-15 NOTE — ED Provider Notes (Signed)
[facility] DEPT Provider Note   CSN: [id number] Arrival date & time: 12/15/21  1307     History  Chief Complaint  Patient presents with   Abdominal Pain   Emesis   Nausea    Kyle Gonzales is a 31 y.o. 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 No hx of alcohol abuse or pancreatitis. Denies diarrhea or blood in emesis. Endorses eating subway last night.  Denies change of pain with eating, or position.  Reports the pain is sharp in nature.  Denies any dysuria, hematuria.     Abdominal Pain Associated symptoms: vomiting   Emesis Associated symptoms: abdominal pain       Home Medications Prior to Admission medications   Medication Sig Start Date End Date Taking? Authorizing Provider  amLODipine (NORVASC) 2.5 MG tablet Take 1 tablet (2.5 mg total) by mouth daily. 05/19/20   Guilford Shi, MD  apixaban (ELIQUIS) 5 MG TABS tablet TAKE 1 TABLET(5 MG) BY MOUTH TWICE DAILY Patient taking differently: Take 5 mg by mouth 2 (two) times daily. 01/13/19   [name], MD  ARIPiprazole (ABILIFY) 10 MG tablet Take 1 tablet (10 mg total) by mouth daily. 10/05/21   Salley Slaughter, NP  bisoprolol-hydrochlorothiazide [facility]) 10-6.25 MG tablet LAST REFILL - Take 1 tablet Daily for BP - ABSOLUTELY  Must have Office Visit before Refill 03/07/21   [name], MD  Cholecalciferol (VITAMIN D3) 5000 units CAPS Take 1 capsule (5,000 Units total) by mouth every other day. Patient taking differently: Take 5,000 Units by mouth daily. 05/02/18   Elgergawy, Silver Huguenin, MD  ezetimibe (ZETIA) 10 MG tablet Take  1 tablet  Daily  for Cholesterol 11/26/21   [name], MD  hydrOXYzine (ATARAX) 50 MG tablet TAKE ONE TABLET BY MOUTH THREE TIMES A DAY AS NEEDED FOR  ANXIETY 12/05/21   Salley Slaughter, NP  levothyroxine (SYNTHROID) 100 MCG tablet Take 1 tablet   Daily   on an empty stomach with only water for 30 minutes & no Antacid meds, Calcium or Magnesium for 4 hours & avoid Biotin 11/26/21   [name], MD  omeprazole (PRILOSEC) 40 MG capsule TAKE ONE CAPSULE BY MOUTH DAILY **KEEP DOCTOR APPOINTMENT FOR FURTHER REFILLS** 12/02/21   Armbruster, Carlota Raspberry, MD  rosuvastatin (CRESTOR) 10 MG tablet Take 1 tablet Daily for Cholesterol 11/26/21   [name], MD  sertraline (ZOLOFT) 100 MG tablet Take 1.5 tablets (150 mg total) by mouth daily. Patient taking differently: Take 150 mg by mouth daily. Takes as needed 10/05/21   Salley Slaughter, NP  tadalafil (CIALIS) 20 MG tablet Take  1/2 to 1 tablet  every 2 to 3 days  as needed for XXXX 02/18/21   [name], MD  traZODone (DESYREL) 100 MG tablet TAKE 1 TABLET (100 MG TOTAL) BY MOUTH AT BEDTIME. Patient taking differently: Take by mouth at bedtime. As needed 10/05/21   Salley Slaughter, NP      Allergies    Tylenol [acetaminophen]    Review of Systems   Review of Systems  Gastrointestinal:  Positive for abdominal pain and vomiting.  All other systems reviewed and are negative.  Physical Exam Updated Vital Signs BP (!) 149/86    Pulse 77    Temp 98.4 F (36.9 C) (Oral)    Resp 18  SpO2 99%  Physical Exam Vitals and nursing note reviewed.  Constitutional:      General: He is not in acute distress.    Appearance: Normal appearance. He is ill-appearing.  HENT:     Head: Normocephalic and atraumatic.  Eyes:     General:        Right eye: No discharge.        Left eye: No discharge.  Cardiovascular:     Rate and Rhythm: Normal rate and regular rhythm.     Heart sounds: No murmur heard.   No friction rub. No gallop.  Pulmonary:     Effort: Pulmonary effort is normal.     Breath sounds: Normal breath sounds.  Abdominal:     General: Bowel sounds are normal.     Palpations:  Abdomen is soft.     Comments: Tenderness to palpation without palpable mass in the epigastric region, no rebound, rigidity, guarding.  Skin:    General: Skin is warm and dry.     Capillary Refill: Capillary refill takes less than 2 seconds.  Neurological:     Mental Status: He is alert and oriented to person, place, and time.  Psychiatric:        Mood and Affect: Mood normal.        Behavior: Behavior normal.    ED Results / Procedures / Treatments   Labs (all labs ordered are listed, but only abnormal results are displayed) Labs Reviewed  COMPREHENSIVE METABOLIC PANEL - Abnormal; Notable for the following components:      Result Value   Glucose, Bld 120 (*)    Total Protein 8.4 (*)    AST 85 (*)    ALT 92 (*)    All other components within normal limits  CBC - Abnormal; Notable for the following components:   WBC 12.4 (*)    All other components within normal limits  URINALYSIS, ROUTINE W REFLEX MICROSCOPIC - Abnormal; Notable for the following components:   pH 9.0 (*)    Protein, ur 30 (*)    Bacteria, UA RARE (*)    All other components within normal limits  RESP PANEL BY RT-PCR (FLU A&B, COVID) ARPGX2  LIPASE, BLOOD    EKG None  Radiology CT ABDOMEN PELVIS W CONTRAST  Result Date: 12/15/2021 CLINICAL DATA:  Epigastric pain, nausea, vomiting. Previous cholecystectomy EXAM: CT ABDOMEN AND PELVIS WITH CONTRAST TECHNIQUE: Multidetector CT imaging of the abdomen and pelvis was performed using the standard protocol following bolus administration of intravenous contrast. RADIATION DOSE REDUCTION: This exam was performed according to the departmental dose-optimization program which includes automated exposure control, adjustment of the mA and/or kV according to patient size and/or use of iterative reconstruction technique. CONTRAST:  156mL OMNIPAQUE IOHEXOL 300 MG/ML  SOLN COMPARISON:  MR 10/22/2021 and previous FINDINGS: Lower chest: No pleural or pericardial effusion.  Hepatobiliary: Stable moderate central intrahepatic biliary ductal dilatation. The CBD is normal in caliber. 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 2.9 cm subcapsular hemangioma in hepatic segment 5/6, and 1.8 cm subcapsular lesion in segment 5, previously characterized as hemangioma on MRI. No new liver lesion. Post cholecystectomy. Pancreas: Unremarkable. No pancreatic ductal dilatation or surrounding inflammatory changes. Spleen: Normal in size without focal abnormality. Adrenals/Urinary Tract: Adrenal glands are unremarkable. Kidneys are normal, without renal calculi, focal lesion, or hydronephrosis. Bladder is unremarkable. Stomach/Bowel: Stomach is distended by ingested material. Small bowel decompressed. The colon is nondilated. Appendix: Location: Posteromedial to  cecum extending towards midline Diameter: 1.7 cm Appendicolith: None seen Mucosal hyper-enhancement: Mild, near the base Extraluminal gas: None Periappendiceal collection: None. Mild regional inflammatory changes Vascular/Lymphatic: Chronic cavernous transformation of portal vein. No arterial pathology identified. No abdominal or pelvic adenopathy localized. Reproductive: Prostate is unremarkable. Other: No ascites.  No free air. Musculoskeletal: No acute findings. IMPRESSION: 1. CT findings of acute appendicitis, without perforation or abscess. Critical Value/emergent results were called by telephone at the time of interpretation on 12/15/2021 at 4:43 pm to provider [facility] , who verbally acknowledged these results. 2. 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 3. Benign hepatic hemangiomas again noted. Electronically Signed   By: Lucrezia Europe M.D.   On: 12/15/2021 16:45    Procedures Procedures    Medications Ordered in ED Medications  cefTRIAXone (ROCEPHIN) 2 g in sodium chloride 0.9 % 100 mL IVPB (0 g  Intravenous Stopped 12/15/21 1756)    And  metroNIDAZOLE (FLAGYL) IVPB 500 mg (500 mg Intravenous New Bag/Given 12/15/21 1756)  ondansetron (ZOFRAN-ODT) disintegrating tablet 4 mg (4 mg Oral Given 12/15/21 1436)  morphine (PF) 2 MG/ML injection 2 mg (2 mg Intravenous Given 12/15/21 1521)  iohexol (OMNIPAQUE) 300 MG/ML solution 100 mL (100 mLs Intravenous Contrast Given 12/15/21 1608)  morphine (PF) 2 MG/ML injection 2 mg (2 mg Intravenous Given 12/15/21 1728)  ondansetron (ZOFRAN) injection 4 mg (4 mg Intravenous Given 12/15/21 1728)  morphine (PF) 4 MG/ML injection 4 mg (4 mg Intravenous Given 12/15/21 1744)    ED Course/ Medical Decision Making/ A&P Clinical Course as of 12/15/21 1833  Thu Dec 15, 2021  1655 Last meal at 9-10a today [CP]  1738 Admission to surgery, Dr. Redmond Pulling to see patient. Abx started, patient NPO at this time  Spoke with Dr. Redmond Pulling again -- will admit to medicine. Hx of protein S deficiency, previous complicated surgical hx.  [CP]  4196 Patel to admit [CP]    Clinical Course User Index [CP] Anselmo Pickler, PA-C                           Medical Decision Making Amount and/or Complexity of Data Reviewed Labs: ordered. Radiology: ordered.  Risk Prescription drug management.   I discussed this case with my attending physician who cosigned this note including patient's presenting symptoms, physical exam, and planned diagnostics and interventions. Attending physician stated agreement with plan or made changes to plan which were implemented.   This patient presents to the ED for concern of Abdominal pain, nausea, vomiting, this involves an extensive number of treatment options, and is a complaint that carries with it a high risk of complications and morbidity. The emergent differential diagnosis prior to evaluation includes, but is not limited to, mesenteric ischemia, pancreatitis, choledocholithiasis, worsening PSC, gastroenteritis, food poisoning versus other.  This is not  an exhaustive differential.   Past Medical History / Co-morbidities: Protein S deficiency, PSC, history of cholecystectomy, history of appendectomy with incomplete removal of appendix.  Additional history: Additional history obtained from patient's mother. External records from outside source obtained and reviewed including previous radiology, surgery, outpatient GI notes.  Physical Exam: Physical exam performed. The pertinent findings include: Tenderness to palpation located in umbilicus, some radiation towards the right lower quadrant, some radiation towards epigastric region.  Nontoxic, nonseptic appearing.  Lab Tests: I ordered, and personally interpreted labs.  The pertinent results include: Mild leukocytosis white blood cells at 12.4.  Urinalysis overall unremarkable, rare bacteria noted.  CMP overall unremarkable, slightly elevated AST and ALT at 85 and 92 respectively.  Mild hyperglycemia glucose 120.  Negative lipase, negative RVP.   Imaging Studies: I ordered imaging studies including CT abdomen pelvis. I independently visualized and interpreted imaging which showed concern for acute appendicitis, chronic changes of the portal venous circulation, and liver as noted in official radiology interpretation. I agree with the radiologist interpretation.   Medications: I ordered medication including antibiotics for infection, morphine for pain control, Zofran for nausea.  Patient with some improvement of pain on reevaluation.  Consultations Obtained: I requested consultation with the general surgeon, spoke to Dr. Redmond Pulling who agrees to see patient, and discussed lab and imaging findings as well as pertinent plan - they recommend: Admission to medicine with surgery consult, possible surgery in the morning versus watch and monitor given complicated abdominal history.  Spoke with Dr. Posey Pronto who agrees to admission at this time.  Patient informed of plan, remains in stable condition at time of  admission.   Final Clinical Impression(s) / ED Diagnoses Final diagnoses:  None    Rx / DC Orders ED Discharge Orders     None         Dorien Chihuahua 12/15/21 1833    Charlesetta Shanks, MD 12/22/21 2309

## 2021-12-15 NOTE — ED Provider Triage Note (Signed)
Emergency Medicine Provider Triage Evaluation Note ? ?Kyle Gonzales , a 31 y.o. male  was evaluated in triage.  Pt complains of epigastric abdominal pain, nausea, vomiting x 1 since this morning. Hx of gallbladder removal ~ one year ago. No hx of alcohol abuse or pancreatitis. Denies diarrhea or blood in emesis. Endorses eating subway last night. ? ?Review of Systems  ?Positive: nv ?Negative: Diarrhea, chest pain, Shob ? ?Physical Exam  ?BP (!) 142/81 (BP Location: Left Arm)   Pulse 89   Temp 98.4 ?F (36.9 ?C) (Oral)   Resp 16   SpO2 98%  ?Gen:   Awake, no distress, somewhat ill appearing ?Resp:  Normal effort  ?MSK:   Moves extremities without difficulty  ?Other:  Some TTP epigastric region, no rebound, rigidity, guarding ? ?Medical Decision Making  ?Medically screening exam initiated at 1:22 PM.  Appropriate orders placed.  Kyle Gonzales was informed that the remainder of the evaluation will be completed by another provider, this initial triage assessment does not replace that evaluation, and the importance of remaining in the ED until their evaluation is complete. ? ?Workup initiated ?  ?Anselmo Pickler, PA-C ?12/15/21 1324 ? ?

## 2021-12-15 NOTE — ED Notes (Signed)
Patient is going to be transported to room via ED tech with all belongings ?

## 2021-12-16 DIAGNOSIS — E890 Postprocedural hypothyroidism: Secondary | ICD-10-CM | POA: Diagnosis not present

## 2021-12-16 DIAGNOSIS — K55069 Acute infarction of intestine, part and extent unspecified: Secondary | ICD-10-CM

## 2021-12-16 LAB — APTT
aPTT: 29 seconds (ref 24–36)
aPTT: 65 seconds — ABNORMAL HIGH (ref 24–36)
aPTT: 91 seconds — ABNORMAL HIGH (ref 24–36)
aPTT: 111 seconds — ABNORMAL HIGH (ref 24–36)

## 2021-12-16 LAB — COMPREHENSIVE METABOLIC PANEL
ALT: 77 U/L — ABNORMAL HIGH (ref 0–44)
AST: 53 U/L — ABNORMAL HIGH (ref 15–41)
Albumin: 4.5 g/dL (ref 3.5–5.0)
Alkaline Phosphatase: 39 U/L (ref 38–126)
Anion gap: 11 (ref 5–15)
BUN: 12 mg/dL (ref 6–20)
CO2: 26 mmol/L (ref 22–32)
Calcium: 9.3 mg/dL (ref 8.9–10.3)
Chloride: 97 mmol/L — ABNORMAL LOW (ref 98–111)
Creatinine, Ser: 1.03 mg/dL (ref 0.61–1.24)
GFR, Estimated: >60 mL/min (ref >60)
Glucose, Bld: 121 mg/dL — ABNORMAL HIGH (ref 70–99)
Potassium: 3.5 mmol/L (ref 3.5–5.1)
Sodium: 134 mmol/L — ABNORMAL LOW (ref 135–145)
Total Bilirubin: 0.8 mg/dL (ref 0.3–1.2)
Total Protein: 7.8 g/dL (ref 6.5–8.1)

## 2021-12-16 LAB — CBC
HCT: 39.3 % (ref 39.0–52.0)
HCT: 39.6 % (ref 39.0–52.0)
Hemoglobin: 12.8 g/dL — ABNORMAL LOW (ref 13.0–17.0)
Hemoglobin: 13 g/dL (ref 13.0–17.0)
MCH: 30.5 pg (ref 26.0–34.0)
MCH: 31.1 pg (ref 26.0–34.0)
MCHC: 32.6 g/dL (ref 30.0–36.0)
MCHC: 32.8 g/dL (ref 30.0–36.0)
MCV: 93.6 fL (ref 80.0–100.0)
MCV: 94.7 fL (ref 80.0–100.0)
Platelets: 158 10*3/uL (ref 150–400)
Platelets: 159 10*3/uL (ref 150–400)
RBC: 4.18 MIL/uL — ABNORMAL LOW (ref 4.22–5.81)
RBC: 4.2 MIL/uL — ABNORMAL LOW (ref 4.22–5.81)
RDW: 14.3 % (ref 11.5–15.5)
RDW: 14.3 % (ref 11.5–15.5)
WBC: 8.5 10*3/uL (ref 4.0–10.5)
WBC: 8.5 10*3/uL (ref 4.0–10.5)
nRBC: 0 % (ref 0.0–0.2)
nRBC: 0 % (ref 0.0–0.2)

## 2021-12-16 LAB — HEPARIN LEVEL (UNFRACTIONATED)
Heparin Unfractionated: 0.52 IU/mL (ref 0.30–0.70)
Heparin Unfractionated: 0.99 IU/mL — ABNORMAL HIGH (ref 0.30–0.70)

## 2021-12-16 LAB — HIV ANTIBODY (ROUTINE TESTING W REFLEX): HIV Screen 4th Generation wRfx: NONREACTIVE

## 2021-12-16 LAB — PROTIME-INR
INR: 1.1 (ref 0.8–1.2)
Prothrombin Time: 14.6 seconds (ref 11.4–15.2)

## 2021-12-16 MED ORDER — LACTATED RINGERS IV SOLN: INTRAVENOUS | Status: AC

## 2021-12-16 NOTE — Progress Notes (Signed)
ANTICOAGULATION CONSULT NOTE ? 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 ?Allergies  ?Allergen Reactions  ? Tylenol [Acetaminophen] Other (See Comments)  ?  r/t elevated LFTs ?  ? ? ?Patient Measurements: ?Height: 6\' 3"  (190.5 cm) ?Weight: 126.3 kg (278 lb 7.1 oz) ?IBW/kg (Calculated) : 84.5 ?Heparin Dosing Weight: 112 kg ? ?Vital Signs: ?Temp: 98.9 ?F (37.2 ?C) (03/03 0451) ?Temp Source: Oral (03/03 0451) ?BP: 145/86 (03/03 0451) ?Pulse Rate: 105 (03/03 0451) ? ?Labs: ?Recent Labs  ?  12/15/21 ?1315 12/15/21 ?2047 12/16/21 ?0451  ?HGB 13.9  --  13.0  12.8*  ?HCT 41.5  --  39.6  39.3  ?PLT 183  --  159  158  ?APTT  --  29 65*  ?LABPROT  --   --  14.6  ?INR  --   --  1.1  ?HEPARINUNFRC  --  0.52 0.99*  ?CREATININE 1.11  --  1.03  ? ? ? ?Estimated Creatinine Clearance: 151.5 mL/min (by C-G formula based on SCr of 1.03 mg/dL). ? ? ?Medical History: ?Past Medical History:  ?Diagnosis Date  ? GERD (gastroesophageal reflux disease)   ? Graves disease 08/18/2015  ? Hyperlipidemia   ? Hypothyroid   ? Palpitations   ? PSC (primary sclerosing cholangitis)   ? 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 Vitamin D deficiency   ? ? ?Medications:  ?Medications Prior to Admission  ?Medication Sig Dispense Refill Last Dose  ? amLODipine (NORVASC) 2.5 MG tablet Take 1 tablet (2.5 mg total) by mouth daily. 30 tablet 1   ? apixaban (ELIQUIS) 5 MG TABS tablet TAKE 1 TABLET(5 MG) BY MOUTH TWICE DAILY (Patient taking differently: Take 5 mg by mouth 2 (two) times daily.) 180 tablet 0   ? ARIPiprazole (ABILIFY) 10 MG tablet Take 1 tablet (10 mg total) by mouth daily. 30 tablet 3 12/14/2021  ? bisoprolol-hydrochlorothiazide (ZIAC) 10-6.25 MG tablet LAST REFILL - Take 1 tablet Daily for BP - ABSOLUTELY  Must have Office Visit before Refill (Patient taking differently: Take 1 tablet by mouth daily.) 7 tablet 0   ? Cholecalciferol (VITAMIN D3) 5000 units CAPS Take 1  capsule (5,000 Units total) by mouth every other day. (Patient taking differently: Take 5,000 Units by mouth daily.)   12/14/2021  ? ezetimibe (ZETIA) 10 MG tablet Take  1 tablet  Daily  for Cholesterol (Patient taking differently: Take 10 mg by mouth daily. Take  1 tablet  Daily  for Cholesterol) 90 tablet 1 12/13/2021  ? hydrOXYzine (ATARAX) 50 MG tablet TAKE ONE TABLET BY MOUTH THREE TIMES A DAY AS NEEDED FOR ANXIETY (Patient taking differently: Take 50 mg by mouth every 8 (eight) hours as needed for anxiety.) 90 tablet 1 12/14/2021  ? levothyroxine (SYNTHROID) 100 MCG tablet Take 1 tablet   Daily   on an empty stomach with only water for 30 minutes & no Antacid meds, Calcium or Magnesium for 4 hours & avoid Biotin (Patient taking differently: Take 100 mcg by mouth daily before breakfast.) 90 tablet 1 12/14/2021  ? omeprazole (PRILOSEC) 40 MG capsule TAKE ONE CAPSULE BY MOUTH DAILY **KEEP DOCTOR APPOINTMENT FOR FURTHER REFILLS** (Patient taking differently: 40 mg daily as needed (acid eflux).) 30 capsule 1 12/15/2021  ? rosuvastatin (CRESTOR) 10 MG tablet Take 1 tablet Daily for Cholesterol (Patient taking differently: Take 10 mg by mouth daily.) 90 tablet 1 12/13/2021  ? sertraline (ZOLOFT) 100 MG tablet  Take 1.5 tablets (150 mg total) by mouth daily. (Patient taking differently: Take 100 mg by mouth daily.) 45 tablet 3 12/13/2021  ? SUTAB [id number] MG TABS Take by mouth.     ? tadalafil (CIALIS) 20 MG tablet Take  1/2 to 1 tablet  every 2 to 3 days  as needed for XXXX 30 tablet 0   ? traZODone (DESYREL) 100 MG tablet TAKE 1 TABLET (100 MG TOTAL) BY MOUTH AT BEDTIME. (Patient not taking: Reported on 12/15/2021) 30 tablet 3 Not Taking  ? ?Scheduled:  ? ARIPiprazole  10 mg Oral Daily  ? bisoprolol-hydrochlorothiazide  1 tablet Oral Daily  ? levothyroxine  100 mcg Oral Q0600  ? sertraline  100 mg Oral Daily  ? ?Infusions:  ? cefTRIAXone (ROCEPHIN)  IV    ? And  ? metronidazole 500 mg (12/16/21 0513)  ? heparin 1,900 Units/hr  (12/15/21 2242)  ? lactated ringers 100 mL/hr at 12/15/21 2041  ? ? ?Assessment: ?45 yoM presented on 3/2 with abdominal pain, N/V.  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  Eliquis is now held for possible procedures.  Pharmacy is consulted to dose Heparin IV.  ?Last apixaban dose on 3/2 morning ? ?Baseline heparin level 0.52-falsely elevated d/t recent DOAC exposure.  Plan to use aPTT for heparin monitoring and dose titration. ?CBC:  Hgb and Plt WNL ?SCr 1.11 ? ?12/16/2021: ?Initial aptt = 65sec, which is just at the low end of goal range on IV heparin 1900 units/hr ?CBC WNL ?No bleeding or infusion related concerns per RN ? ?Goal of Therapy:  ?Heparin level 0.3-0.7 units/ml ?aPTT 66-102 seconds ?Monitor platelets by anticoagulation protocol: Yes ?  ?Plan:  ?No heparin bolus d/t recent apixaban use ?Slightly increase heparin IV infusion to 2000 units/hr  ?Check aPTT 6 hours after increase ?Daily aPTT, heparin level, and CBC ?Continue to monitor H&H and platelets ? ?Netta Cedars PharmD, BCPS ?Clinical Pharmacist ?[facility] [phone] ?12/16/2021 5:43 AM ? ?

## 2021-12-16 NOTE — Progress Notes (Signed)
ANTICOAGULATION CONSULT NOTE ? 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 ?Allergies  ?Allergen Reactions  ? Tylenol [Acetaminophen] Other (See Comments)  ?  r/t elevated LFTs ?  ? ? ?Patient Measurements: ?Height: 6\' 3"  (190.5 cm) ?Weight: 126.3 kg (278 lb 7.1 oz) ?IBW/kg (Calculated) : 84.5 ?Heparin Dosing Weight: 112 kg ? ?Vital Signs: ?Temp: 98.1 ?F (36.7 ?C) (03/03 0998) ?Temp Source: Oral (03/03 3382) ?BP: 138/86 (03/03 0835) ?Pulse Rate: 104 (03/03 0835) ? ?Labs: ?Recent Labs  ?  12/15/21 ?1315 12/15/21 ?2047 12/16/21 ?0451 12/16/21 ?1152  ?HGB 13.9  --  13.0  12.8*  --   ?HCT 41.5  --  39.6  39.3  --   ?PLT 183  --  159  158  --   ?APTT  --  29 65* 91*  ?LABPROT  --   --  14.6  --   ?INR  --   --  1.1  --   ?HEPARINUNFRC  --  0.52 0.99*  --   ?CREATININE 1.11  --  1.03  --   ? ? ? ?Estimated Creatinine Clearance: 151.5 mL/min (by C-G formula based on SCr of 1.03 mg/dL). ? ?Medications:  ?Medications Prior to Admission  ?Medication Sig Dispense Refill Last Dose  ? amLODipine (NORVASC) 2.5 MG tablet Take 1 tablet (2.5 mg total) by mouth daily. 30 tablet 1 12/15/2021  ? apixaban (ELIQUIS) 5 MG TABS tablet TAKE 1 TABLET(5 MG) BY MOUTH TWICE DAILY (Patient taking differently: Take 5 mg by mouth 2 (two) times daily.) 180 tablet 0 12/15/2021 at 1000  ? ARIPiprazole (ABILIFY) 10 MG tablet Take 1 tablet (10 mg total) by mouth daily. 30 tablet 3 12/15/2021  ? bisoprolol-hydrochlorothiazide (ZIAC) 10-6.25 MG tablet LAST REFILL - Take 1 tablet Daily for BP - ABSOLUTELY  Must have Office Visit before Refill (Patient taking differently: Take 1 tablet by mouth daily.) 7 tablet 0 12/15/2021 at 1000  ? Cholecalciferol (VITAMIN D3) 5000 units CAPS Take 1 capsule (5,000 Units total) by mouth every other day. (Patient taking differently: Take 5,000 Units by mouth daily.)   12/15/2021  ? ezetimibe (ZETIA) 10 MG tablet Take  1 tablet  Daily  for Cholesterol (Patient  taking differently: Take 10 mg by mouth daily. Take  1 tablet  Daily  for Cholesterol) 90 tablet 1 Past Week  ? hydrOXYzine (ATARAX) 50 MG tablet TAKE ONE TABLET BY MOUTH THREE TIMES A DAY AS NEEDED FOR ANXIETY (Patient taking differently: Take 50 mg by mouth every 8 (eight) hours as needed for anxiety.) 90 tablet 1 Past Week  ? levothyroxine (SYNTHROID) 100 MCG tablet Take 1 tablet   Daily   on an empty stomach with only water for 30 minutes & no Antacid meds, Calcium or Magnesium for 4 hours & avoid Biotin (Patient taking differently: Take 100 mcg by mouth daily before breakfast.) 90 tablet 1 Past Week  ? omeprazole (PRILOSEC) 40 MG capsule TAKE ONE CAPSULE BY MOUTH DAILY **KEEP DOCTOR APPOINTMENT FOR FURTHER REFILLS** (Patient taking differently: 40 mg daily as needed (acid eflux).) 30 capsule 1 12/15/2021  ? rosuvastatin (CRESTOR) 10 MG tablet Take 1 tablet Daily for Cholesterol (Patient taking differently: Take 10 mg by mouth daily.) 90 tablet 1 12/15/2021  ? sertraline (ZOLOFT) 100 MG tablet Take 1.5 tablets (150 mg total) by mouth daily. (Patient taking differently: Take 100 mg by mouth daily.) 45 tablet 3 Past Week  ? ?Scheduled:  ? ARIPiprazole  10 mg Oral Daily  ? bisoprolol-hydrochlorothiazide  1 tablet Oral Daily  ? levothyroxine  100 mcg Oral Q0600  ? sertraline  100 mg Oral Daily  ? ?Infusions:  ? cefTRIAXone (ROCEPHIN)  IV    ? And  ? metronidazole 500 mg (12/16/21 0513)  ? heparin 2,000 Units/hr (12/16/21 1143)  ? ? ?Assessment: ?57 yoM presented on 3/2 with abdominal pain, N/V. 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 Eliquis is now held for possible procedures. Pharmacy is consulted to dose Heparin IV.  ?Last apixaban dose on 3/2 morning. ? ?Baseline heparin level 0.52-falsely elevated d/t recent DOAC exposure.  Plan to use aPTT for heparin monitoring and dose  titration. ?CBC:  Hgb and Plt WNL ?SCr 1.11 ? ?12/16/2021: ?aPTT therapeutic on 2000 units/hr ?Heparin level remains elevated from recent DOAC ?Hgb slightly lower than admission, likely dilutional; Plt stable WNL ?No bleeding or infusion related concerns per RN ? ?Goal of Therapy:  ?Heparin level 0.3-0.7 units/ml ?aPTT 66-102 seconds ?Monitor platelets by anticoagulation protocol: Yes ?  ?Plan:  ?Continue heparin IV infusion at 2000 units/hr  ?Check confirmatory aPTT in 6 hr ?Daily aPTT, heparin level, and CBC ?Continue to monitor H&H and platelets ? ?Reuel Boom, PharmD, BCPS ?[phone] ?12/16/2021, 1:22 PM ? ?

## 2021-12-16 NOTE — Assessment & Plan Note (Addendum)
--   Stable.  Continue home bisoprolol-HCTZ. ?

## 2021-12-16 NOTE — Progress Notes (Signed)
?  Progress Note ? ? ?Patient: Kyle Gonzales HUD:[id number] DOB: [birth year] DOA: 12/15/2021     1 ?DOS: the patient was seen and examined on 12/16/2021 ?  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 ?--3/3 continue medical management as per general surgery, patient feeling better ? ?Assessment and Plan: ?* Acute appendicitis ?--CT imaging suggestive of acute appendicitis.  Has history of perforated appendicitis with abscess 04/2020.  ?--Continue empiric antibiotics, management per general surgery.  Conservative at this time.   ?-- Continue heparin infusion ? ?Primary sclerosing cholangitis ?Follows with [facility] and [facility], Dr. Havery Moros.  Has mild elevation in AST and ALT. Chronic and postcholecystectomy changes noted on CT. ? 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 ?-- Eliquis on hold, continue heparin infusion ? ?Hypertension ?-- Stable.  Continue home bisoprolol-HCTZ. ? ?Hypothyroidism ?--Continue Synthroid. ? ?Hyperlipidemia, mixed ?--Rosuvastatin and Zetia on hold for now. ? ?Depression with anxiety ?--Continue Abilify, sertraline, Atarax as needed. ? ? ? ? ? ? ? ?Subjective:  ?Feels better ?Still some abd pain ? ?Physical Exam: ?Vitals:  ? 12/16/21 0451 12/16/21 8588 12/16/21 0835 12/16/21 1322  ?BP: (!) 145/86  138/86 119/66  ?Pulse: (!) 105  (!) 104 96  ?Resp: 18 20 20 20   ?Temp: 98.9 ?F (37.2 ?C)  98.1 ?F (36.7 ?C) 98.4 ?F (36.9 ?C)  ?TempSrc: Oral  Oral Oral  ?SpO2: 100%  92% 98%  ?Weight:      ?Height:      ? ?Physical Exam ?Vitals reviewed.  ?Constitutional:   ?   General: He is not in  acute distress. ?   Appearance: He is not ill-appearing or toxic-appearing.  ?Cardiovascular:  ?   Rate and Rhythm: Normal rate and regular rhythm.  ?   Heart sounds: No murmur heard. ?Pulmonary:  ?   Effort: Pulmonary effort is normal. No respiratory distress.  ?   Breath sounds: No wheezing, rhonchi or rales.  ?Abdominal:  ?   Palpations: Abdomen is soft.  ?Neurological:  ?   Mental Status: He is alert.  ?Psychiatric:     ?   Mood and Affect: Mood normal.     ?   Behavior: Behavior normal.  ? ? ? ?Data Reviewed: ? ?Transaminases trending down ? ?Family Communication:  ? ?Disposition: ?Status is: Inpatient ?Remains inpatient appropriate because: acute appendicitis ? ? ? ? ? ? ? ? ? Planned Discharge Destination: Home ? ? ? ? ?Time spent: 25 minutes ? ?Author: ?Murray Hodgkins, MD ?12/16/2021 5:01 PM ? ?For on call review [email or web address].  ? ?

## 2021-12-16 NOTE — Progress Notes (Signed)
ANTICOAGULATION CONSULT NOTE ? 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 ?Allergies  ?Allergen Reactions  ? Tylenol [Acetaminophen] Other (See Comments)  ?  r/t elevated LFTs ?  ? ? ?Patient Measurements: ?Height: 6\' 3"  (190.5 cm) ?Weight: 126.3 kg (278 lb 7.1 oz) ?IBW/kg (Calculated) : 84.5 ?Heparin Dosing Weight: 112 kg ? ?Vital Signs: ?Temp: 98.4 ?F (36.9 ?C) (03/03 1322) ?Temp Source: Oral (03/03 1322) ?BP: 119/66 (03/03 1322) ?Pulse Rate: 96 (03/03 1322) ? ?Labs: ?Recent Labs  ?  12/15/21 ?1315 12/15/21 ?2047 12/15/21 ?2047 12/16/21 ?0451 12/16/21 ?1152 12/16/21 ?1812  ?HGB 13.9  --   --  13.0  12.8*  --   --   ?HCT 41.5  --   --  39.6  39.3  --   --   ?PLT 183  --   --  159  158  --   --   ?APTT  --  29   < > 65* 91* 111*  ?LABPROT  --   --   --  14.6  --   --   ?INR  --   --   --  1.1  --   --   ?HEPARINUNFRC  --  0.52  --  0.99*  --   --   ?CREATININE 1.11  --   --  1.03  --   --   ? < > = values in this interval not displayed.  ? ? ? ?Estimated Creatinine Clearance: 151.5 mL/min (by C-G formula based on SCr of 1.03 mg/dL). ? ?Medications:  ?Medications Prior to Admission  ?Medication Sig Dispense Refill Last Dose  ? amLODipine (NORVASC) 2.5 MG tablet Take 1 tablet (2.5 mg total) by mouth daily. 30 tablet 1 12/15/2021  ? apixaban (ELIQUIS) 5 MG TABS tablet TAKE 1 TABLET(5 MG) BY MOUTH TWICE DAILY (Patient taking differently: Take 5 mg by mouth 2 (two) times daily.) 180 tablet 0 12/15/2021 at 1000  ? ARIPiprazole (ABILIFY) 10 MG tablet Take 1 tablet (10 mg total) by mouth daily. 30 tablet 3 12/15/2021  ? bisoprolol-hydrochlorothiazide (ZIAC) 10-6.25 MG tablet LAST REFILL - Take 1 tablet Daily for BP - ABSOLUTELY  Must have Office Visit before Refill (Patient taking differently: Take 1 tablet by mouth daily.) 7 tablet 0 12/15/2021 at 1000  ? Cholecalciferol (VITAMIN D3) 5000 units CAPS Take 1 capsule (5,000 Units total) by mouth every other  day. (Patient taking differently: Take 5,000 Units by mouth daily.)   12/15/2021  ? ezetimibe (ZETIA) 10 MG tablet Take  1 tablet  Daily  for Cholesterol (Patient taking differently: Take 10 mg by mouth daily. Take  1 tablet  Daily  for Cholesterol) 90 tablet 1 Past Week  ? hydrOXYzine (ATARAX) 50 MG tablet TAKE ONE TABLET BY MOUTH THREE TIMES A DAY AS NEEDED FOR ANXIETY (Patient taking differently: Take 50 mg by mouth every 8 (eight) hours as needed for anxiety.) 90 tablet 1 Past Week  ? levothyroxine (SYNTHROID) 100 MCG tablet Take 1 tablet   Daily   on an empty stomach with only water for 30 minutes & no Antacid meds, Calcium or Magnesium for 4 hours & avoid Biotin (Patient taking differently: Take 100 mcg by mouth daily before breakfast.) 90 tablet 1 Past Week  ? omeprazole (PRILOSEC) 40 MG capsule TAKE ONE CAPSULE BY MOUTH DAILY **KEEP DOCTOR APPOINTMENT FOR FURTHER REFILLS** (Patient taking differently: 40 mg daily as needed (acid eflux).) 30 capsule 1 12/15/2021  ?  rosuvastatin (CRESTOR) 10 MG tablet Take 1 tablet Daily for Cholesterol (Patient taking differently: Take 10 mg by mouth daily.) 90 tablet 1 12/15/2021  ? sertraline (ZOLOFT) 100 MG tablet Take 1.5 tablets (150 mg total) by mouth daily. (Patient taking differently: Take 100 mg by mouth daily.) 45 tablet 3 Past Week  ? ?Scheduled:  ? ARIPiprazole  10 mg Oral Daily  ? bisoprolol-hydrochlorothiazide  1 tablet Oral Daily  ? levothyroxine  100 mcg Oral Q0600  ? sertraline  100 mg Oral Daily  ? ?Infusions:  ? cefTRIAXone (ROCEPHIN)  IV 2 g (12/16/21 1636)  ? And  ? metronidazole 500 mg (12/16/21 1740)  ? heparin 2,000 Units/hr (12/16/21 1143)  ? lactated ringers 100 mL/hr at 12/16/21 1737  ? ? ?Assessment: ?23 yoM presented on 3/2 with abdominal pain, N/V. 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 Eliquis is now held  for possible procedures. Pharmacy is consulted to dose Heparin IV.  ?Last apixaban dose on 3/2 morning. ? ?Baseline heparin level 0.52-falsely elevated d/t recent DOAC exposure.  Plan to use aPTT for heparin monitoring and dose titration. ?CBC:  Hgb and Plt WNL ?SCr 1.11 ? ?12/16/2021 PM UPDATE: ?aPTT supratherapeutic at 111 seconds on 2000 units/hr ?Confirmed that aPTT was drawn from opposite arm that heparin is running ?No bleeding or infusion related concerns per RN ? ?Goal of Therapy:  ?Heparin level 0.3-0.7 units/ml ?aPTT 66-102 seconds ?Monitor platelets by anticoagulation protocol: Yes ?  ?Plan:  ?Reduce heparin infusion slightly to 1950 units/hr ?Check confirmatory aPTT in 6 hr ?Daily aPTT, heparin level, and CBC ?Continue to monitor H&H and platelets ? ?Dimple Nanas, PharmD ?12/16/2021 6:57 PM ? ? ?

## 2021-12-16 NOTE — Assessment & Plan Note (Addendum)
--  Continue Abilify, sertraline, Atarax as needed. ?

## 2021-12-16 NOTE — Assessment & Plan Note (Addendum)
--  On chronic anticoagulation with Eliquis due to history of SMV thrombosis in setting of protein S deficiency. ?--stop heparin infusion, start apixaban ?

## 2021-12-16 NOTE — Progress Notes (Signed)
Patient ID: Kyle Gonzales, male   DOB: [birth year], 31 y.o.   MRN: [id number] ?[facility] ?Progress Note ? ?   ?Subjective: ?CC-  ?Overall feeling better since admission. Abdominal pain is not gone but it is less. Rates RLQ pain as 3/10. Denies n/v.  ?WBC WNL, TMAX 100.2, mild tachy low 100s. ? ?Objective: ?Vital signs in last 24 hours: ?Temp:  [98 ?F (36.7 ?C)-100.2 ?F (37.9 ?C)] 98.1 ?F (36.7 ?C) (03/03 4098) ?Pulse Rate:  [68-105] 104 (03/03 0835) ?Resp:  [16-20] 20 (03/03 0835) ?BP: (138-158)/(72-98) 138/86 (03/03 0835) ?SpO2:  [92 %-100 %] 92 % (03/03 0835) ?Weight:  [126.3 kg] 126.3 kg (03/02 2100) ?Last BM Date : 12/15/21 ? ?Intake/Output from previous day: ?03/02 0701 - 03/03 0700 ?In: 1216.8 [I.V.:1038.6; IV Piggyback:178.2] ?Out: -  ?Intake/Output this shift: ?No intake/output data recorded. ? ?PE: ?Gen:  Alert, NAD, pleasant ?Card:  RRR ?Pulm:  rate and effort normal ?Abd: Soft, ND, mild RLQ TTP without rebound or guarding ? ?Lab Results:  ?Recent Labs  ?  12/15/21 ?1315 12/16/21 ?0451  ?WBC 12.4* 8.5  8.5  ?HGB 13.9 13.0  12.8*  ?HCT 41.5 39.6  39.3  ?PLT 183 159  158  ? ?BMET ?Recent Labs  ?  12/15/21 ?1315 12/16/21 ?0451  ?NA 135 134*  ?K 4.4 3.5  ?CL 100 97*  ?CO2 25 26  ?GLUCOSE 120* 121*  ?BUN 12 12  ?CREATININE 1.11 1.03  ?CALCIUM 9.4 9.3  ? ?PT/INR ?Recent Labs  ?  12/16/21 ?0451  ?LABPROT 14.6  ?INR 1.1  ? ?CMP  ?   ?Component Value Date/Time  ? NA 134 (L) 12/16/2021 0451  ? K 3.5 12/16/2021 0451  ? CL 97 (L) 12/16/2021 0451  ? CO2 26 12/16/2021 0451  ? GLUCOSE 121 (H) 12/16/2021 0451  ? BUN 12 12/16/2021 0451  ? CREATININE 1.03 12/16/2021 0451  ? CREATININE 1.09 05/05/2021 0000  ? CALCIUM 9.3 12/16/2021 0451  ? PROT 7.8 12/16/2021 0451  ? ALBUMIN 4.5 12/16/2021 0451  ? AST 53 (H) 12/16/2021 0451  ? AST 303 (HH) 08/16/2018 1150  ? ALT 77 (H) 12/16/2021 0451  ? ALT 543 (HH) 08/16/2018 1150  ? ALKPHOS 39 12/16/2021 0451  ? BILITOT 0.8 12/16/2021 0451  ? BILITOT 10.3 (HH)  08/16/2018 1150  ? GFRNONAA >60 12/16/2021 0451  ? GFRNONAA 88 05/05/2020 1614  ? GFRAA >60 05/18/2020 0410  ? GFRAA 102 05/05/2020 1614  ? ?Lipase  ?   ?Component Value Date/Time  ? LIPASE 28 12/15/2021 1315  ? ? ? ? ? ?Studies/Results: ?CT ABDOMEN PELVIS W CONTRAST ? ?Result Date: 12/15/2021 ?CLINICAL DATA:  Epigastric pain, nausea, vomiting. Previous cholecystectomy EXAM: CT ABDOMEN AND PELVIS WITH CONTRAST TECHNIQUE: Multidetector CT imaging of the abdomen and pelvis was performed using the standard protocol following bolus administration of intravenous contrast. RADIATION DOSE REDUCTION: This exam was performed according to the departmental dose-optimization program which includes automated exposure control, adjustment of the mA and/or kV according to patient size and/or use of iterative reconstruction technique. CONTRAST:  136mL OMNIPAQUE IOHEXOL 300 MG/ML  SOLN COMPARISON:  MR 10/22/2021 and previous FINDINGS: Lower chest: No pleural or pericardial effusion. Hepatobiliary: Stable moderate central intrahepatic biliary ductal dilatation. The CBD is normal in caliber. 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 2.9 cm subcapsular hemangioma in hepatic segment 5/6, and 1.8 cm subcapsular lesion in segment 5, previously characterized as hemangioma on MRI. No new liver lesion. Post  cholecystectomy. Pancreas: Unremarkable. No pancreatic ductal dilatation or surrounding inflammatory changes. Spleen: Normal in size without focal abnormality. Adrenals/Urinary Tract: Adrenal glands are unremarkable. Kidneys are normal, without renal calculi, focal lesion, or hydronephrosis. Bladder is unremarkable. Stomach/Bowel: Stomach is distended by ingested material. Small bowel decompressed. The colon is nondilated. Appendix: Location: Posteromedial to cecum extending towards midline Diameter: 1.7 cm Appendicolith: None seen Mucosal hyper-enhancement: Mild, near the base Extraluminal  gas: None Periappendiceal collection: None. Mild regional inflammatory changes Vascular/Lymphatic: Chronic cavernous transformation of portal vein. No arterial pathology identified. No abdominal or pelvic adenopathy localized. Reproductive: Prostate is unremarkable. Other: No ascites.  No free air. Musculoskeletal: No acute findings. IMPRESSION: 1. CT findings of acute appendicitis, without perforation or abscess. Critical Value/emergent results were called by telephone at the time of interpretation on 12/15/2021 at 4:43 pm to provider [facility] , who verbally acknowledged these results. 2. 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 3. Benign hepatic hemangiomas again noted. Electronically Signed   By: Lucrezia Europe M.D.   On: 12/15/2021 16:45   ? ?Anti-infectives: ?Anti-infectives (From admission, onward)  ? ? Start     Dose/Rate Route Frequency Ordered Stop  ? 12/16/21 1700  cefTRIAXone (ROCEPHIN) 2 g in sodium chloride 0.9 % 100 mL IVPB       ?See Hyperspace for full Linked Orders Report.  ? 2 g ?200 mL/hr over 30 Minutes Intravenous Every 24 hours 12/15/21 1938    ? 12/16/21 0500  metroNIDAZOLE (FLAGYL) IVPB 500 mg       ?See Hyperspace for full Linked Orders Report.  ? 500 mg ?100 mL/hr over 60 Minutes Intravenous Every 12 hours 12/15/21 1938    ? 12/15/21 1700  cefTRIAXone (ROCEPHIN) 2 g in sodium chloride 0.9 % 100 mL IVPB       ?See Hyperspace for full Linked Orders Report.  ? 2 g ?200 mL/hr over 30 Minutes Intravenous  Once 12/15/21 1657 12/15/21 1756  ? 12/15/21 1700  metroNIDAZOLE (FLAGYL) IVPB 500 mg       ?See Hyperspace for full Linked Orders Report.  ? 500 mg ?100 mL/hr over 60 Minutes Intravenous  Once 12/15/21 1657 12/15/21 1948  ? ?  ? ? ? ?Assessment/Plan ?Right lower quadrant pain with nausea and vomiting ?Probable appendicitis ?- h/o Laparoscopic debridement of abscess containing yellow pus and thick fibrinous exudates  05/11/20 by Dr. Hassell Done for Chronic ruptured appendicitis with abscess: extensive perforated appendicitis with pus and distorted anatomy and a tubular appearing structure was resected.  On final pathology report there was no tubular structure identified so he just had probably part of the abscess cavity debrided and resected and he still has his full appendix intact ?- CT 3/2 shows acute appendicitis without perforation or abscess ?- Plan nonoperative management acutely unless patient decompensates. Continue IV antibiotics and follow closely. If the patient improves will plan for outpatient colonoscopy as well as follow up with one of our colorectal surgeons to discuss appendectomy with possible ileocecectomy with probable ureteral stent placement by urology. Continue holding eliquis in case he requires surgery this admission, but hopefully we can avoid.  ? ?ID - rocephin/ flagyl 3/2>> ?FEN - IVF, sips of clears ?VTE - heparin gtt ?Foley - none ? ?Protein S deficiency on chronic Eliquis (last dose 3/2 in AM) ?History of SMV thrombosis ?PSC ?Hypertension ?History of Graves' disease status post elation with resultant hypothyroidism ?Elevated transaminases ? ?I reviewed hospitalist notes, last 24 h vitals  and pain scores, last 48 h intake and output, last 24 h labs and trends, and last 24 h imaging results. ? ?This care required moderate level of medical decision making.  ? ? ? LOS: 1 day  ? ? ?Wellington Hampshire, PA-C ?[facility] ?12/16/2021, 9:52 AM ?Please see Amion for pager number during day hours 7:00am-4:30pm ? ?

## 2021-12-16 NOTE — Plan of Care (Signed)
Pt aox4, pleasant and cooperative.  ?IVF/IV Abx per orders.  ?Heparin Drip.  ?Surgery following, managing care.  ?Pain management per orders.  ? ?Problem: Education: ?Goal: Knowledge of General Education information will improve ?Description: Including pain rating scale, medication(s)/side effects and non-pharmacologic comfort measures ?Outcome: Progressing ?  ?Problem: Health Behavior/Discharge Planning: ?Goal: Ability to manage health-related needs will improve ?Outcome: Progressing ?  ?Problem: Clinical Measurements: ?Goal: Ability to maintain clinical measurements within normal limits will improve ?Outcome: Progressing ?Goal: Will remain free from infection ?Outcome: Progressing ?Goal: Diagnostic test results will improve ?Outcome: Progressing ?Goal: Respiratory complications will improve ?Outcome: Progressing ?Goal: Cardiovascular complication will be avoided ?Outcome: Progressing ?  ?Problem: Activity: ?Goal: Risk for activity intolerance will decrease ?Outcome: Progressing ?  ?Problem: Nutrition: ?Goal: Adequate nutrition will be maintained ?Outcome: Progressing ?  ?Problem: Coping: ?Goal: Level of anxiety will decrease ?Outcome: Progressing ?  ?Problem: Elimination: ?Goal: Will not experience complications related to bowel motility ?Outcome: Progressing ?Goal: Will not experience complications related to urinary retention ?Outcome: Progressing ?  ?Problem: Pain Managment: ?Goal: General experience of comfort will improve ?Outcome: Progressing ?  ?Problem: Safety: ?Goal: Ability to remain free from injury will improve ?Outcome: Progressing ?  ?Problem: Skin Integrity: ?Goal: Risk for impaired skin integrity will decrease ?Outcome: Progressing ?  ?

## 2021-12-16 NOTE — Assessment & Plan Note (Addendum)
Continue Synthroid °

## 2021-12-16 NOTE — Assessment & Plan Note (Addendum)
--  Rosuvastatin and Zetia on hold for now. ?

## 2021-12-16 NOTE — Clinical Social Work Note (Signed)
?  Transition of Care (TOC) Screening Note ? ? ?Patient Details  ?Name: Kyle Gonzales ?Date of Birth: [birth year] ? ? ?Transition of Care (TOC) CM/SW Contact:    ?Trish Mage, LCSW ?Phone Number: ?12/16/2021, 8:41 AM ? ? ? ?Transition of Care Department [facility]) has reviewed patient and no TOC needs have been identified at this time. We will continue to monitor patient advancement through interdisciplinary progression rounds. If new patient transition needs arise, please place a TOC consult. ? ? ?

## 2021-12-16 NOTE — Hospital Course (Addendum)
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  Manage meant per surgery has been considered renal antibiotics. ?--3/5 continue medical management as per general surgery -- now on regular diet, anticipate discharge 3/6 ?

## 2021-12-17 DIAGNOSIS — K8301 Primary sclerosing cholangitis: Secondary | ICD-10-CM

## 2021-12-17 DIAGNOSIS — F418 Other specified anxiety disorders: Secondary | ICD-10-CM

## 2021-12-17 DIAGNOSIS — I10 Essential (primary) hypertension: Secondary | ICD-10-CM | POA: Diagnosis not present

## 2021-12-17 DIAGNOSIS — K55069 Acute infarction of intestine, part and extent unspecified: Secondary | ICD-10-CM | POA: Diagnosis not present

## 2021-12-17 DIAGNOSIS — K3589 Other acute appendicitis without perforation or gangrene: Secondary | ICD-10-CM | POA: Diagnosis not present

## 2021-12-17 LAB — COMPREHENSIVE METABOLIC PANEL
ALT: 87 U/L — ABNORMAL HIGH (ref 0–44)
AST: 70 U/L — ABNORMAL HIGH (ref 15–41)
Albumin: 4.2 g/dL (ref 3.5–5.0)
Alkaline Phosphatase: 49 U/L (ref 38–126)
Anion gap: 8 (ref 5–15)
BUN: 10 mg/dL (ref 6–20)
CO2: 29 mmol/L (ref 22–32)
Calcium: 9 mg/dL (ref 8.9–10.3)
Chloride: 97 mmol/L — ABNORMAL LOW (ref 98–111)
Creatinine, Ser: 1.09 mg/dL (ref 0.61–1.24)
GFR, Estimated: >60 mL/min (ref >60)
Glucose, Bld: 111 mg/dL — ABNORMAL HIGH (ref 70–99)
Potassium: 3.6 mmol/L (ref 3.5–5.1)
Sodium: 134 mmol/L — ABNORMAL LOW (ref 135–145)
Total Bilirubin: 1.5 mg/dL — ABNORMAL HIGH (ref 0.3–1.2)
Total Protein: 7.6 g/dL (ref 6.5–8.1)

## 2021-12-17 LAB — CBC
HCT: 38.1 % — ABNORMAL LOW (ref 39.0–52.0)
Hemoglobin: 12.2 g/dL — ABNORMAL LOW (ref 13.0–17.0)
MCH: 30.5 pg (ref 26.0–34.0)
MCHC: 32 g/dL (ref 30.0–36.0)
MCV: 95.3 fL (ref 80.0–100.0)
Platelets: 141 10*3/uL — ABNORMAL LOW (ref 150–400)
RBC: 4 MIL/uL — ABNORMAL LOW (ref 4.22–5.81)
RDW: 14.2 % (ref 11.5–15.5)
WBC: 8.6 10*3/uL (ref 4.0–10.5)
nRBC: 0 % (ref 0.0–0.2)

## 2021-12-17 LAB — APTT
aPTT: 120 seconds — ABNORMAL HIGH (ref 24–36)
aPTT: 87 seconds — ABNORMAL HIGH (ref 24–36)
aPTT: 90 seconds — ABNORMAL HIGH (ref 24–36)

## 2021-12-17 LAB — HEPARIN LEVEL (UNFRACTIONATED): Heparin Unfractionated: 0.68 IU/mL (ref 0.30–0.70)

## 2021-12-17 MED ORDER — HEPARIN (PORCINE) 25000 UT/250ML-% IV SOLN
1800.0000 [IU]/h | INTRAVENOUS | Status: DC
  Administered 2021-12-17 – 2021-12-18 (×3): 1800 [IU]/h via INTRAVENOUS
  Filled 2021-12-17 (×3): qty 250

## 2021-12-17 MED ORDER — POLYETHYLENE GLYCOL 3350 17 G PO PACK
17.0000 g | PACK | Freq: Every day | ORAL | Status: DC
  Administered 2021-12-17 – 2021-12-18 (×2): 17 g via ORAL
  Filled 2021-12-17 (×2): qty 1

## 2021-12-17 MED ORDER — DOCUSATE SODIUM 100 MG PO CAPS
100.0000 mg | ORAL_CAPSULE | Freq: Two times a day (BID) | ORAL | Status: DC
  Administered 2021-12-17 – 2021-12-18 (×3): 100 mg via ORAL
  Filled 2021-12-17 (×5): qty 1

## 2021-12-17 NOTE — Progress Notes (Signed)
ANTICOAGULATION CONSULT NOTE ? 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 ?Allergies  ?Allergen Reactions  ? Tylenol [Acetaminophen] Other (See Comments)  ?  r/t elevated LFTs ?  ? ? ?Patient Measurements: ?Height: '6\' 3"'$  (190.5 cm) ?Weight: 126.3 kg (278 lb 7.1 oz) ?IBW/kg (Calculated) : 84.5 ?Heparin Dosing Weight: 112 kg ? ?Vital Signs: ?Temp: 98 ?F (36.7 ?C) (03/04 0533) ?Temp Source: Oral (03/04 0533) ?BP: 153/76 (03/04 0533) ?Pulse Rate: 94 (03/04 0533) ? ?Labs: ?Recent Labs  ?  12/15/21 ?1315 12/15/21 ?1315 12/15/21 ?2047 12/16/21 ?0451 12/16/21 ?1152 12/16/21 ?1812 12/17/21 ?0135 12/17/21 ?1019  ?HGB 13.9  --   --  13.0  12.8*  --   --  12.2*  --   ?HCT 41.5  --   --  39.6  39.3  --   --  38.1*  --   ?PLT 183  --   --  159  158  --   --  141*  --   ?APTT  --    < > 29 65*   < > 111* 120* 87*  ?LABPROT  --   --   --  14.6  --   --   --   --   ?INR  --   --   --  1.1  --   --   --   --   ?HEPARINUNFRC  --   --  0.52 0.99*  --   --  0.68  --   ?CREATININE 1.11  --   --  1.03  --   --  1.09  --   ? < > = values in this interval not displayed.  ? ? ? ?Estimated Creatinine Clearance: 143.1 mL/min (by C-G formula based on SCr of 1.09 mg/dL). ? ?Medications:  ?Medications Prior to Admission  ?Medication Sig Dispense Refill Last Dose  ? amLODipine (NORVASC) 2.5 MG tablet Take 1 tablet (2.5 mg total) by mouth daily. 30 tablet 1 12/15/2021  ? apixaban (ELIQUIS) 5 MG TABS tablet TAKE 1 TABLET(5 MG) BY MOUTH TWICE DAILY (Patient taking differently: Take 5 mg by mouth 2 (two) times daily.) 180 tablet 0 12/15/2021 at 1000  ? ARIPiprazole (ABILIFY) 10 MG tablet Take 1 tablet (10 mg total) by mouth daily. 30 tablet 3 12/15/2021  ? bisoprolol-hydrochlorothiazide (ZIAC) 10-6.25 MG tablet LAST REFILL - Take 1 tablet Daily for BP - ABSOLUTELY  Must have Office Visit before Refill (Patient taking differently: Take 1 tablet by mouth daily.) 7 tablet 0 12/15/2021 at  1000  ? Cholecalciferol (VITAMIN D3) 5000 units CAPS Take 1 capsule (5,000 Units total) by mouth every other day. (Patient taking differently: Take 5,000 Units by mouth daily.)   12/15/2021  ? ezetimibe (ZETIA) 10 MG tablet Take  1 tablet  Daily  for Cholesterol (Patient taking differently: Take 10 mg by mouth daily. Take  1 tablet  Daily  for Cholesterol) 90 tablet 1 Past Week  ? hydrOXYzine (ATARAX) 50 MG tablet TAKE ONE TABLET BY MOUTH THREE TIMES A DAY AS NEEDED FOR ANXIETY (Patient taking differently: Take 50 mg by mouth every 8 (eight) hours as needed for anxiety.) 90 tablet 1 Past Week  ? levothyroxine (SYNTHROID) 100 MCG tablet Take 1 tablet   Daily   on an empty stomach with only water for 30 minutes & no Antacid meds, Calcium or Magnesium for 4 hours & avoid Biotin (Patient taking differently: Take 100 mcg by mouth  daily before breakfast.) 90 tablet 1 Past Week  ? omeprazole (PRILOSEC) 40 MG capsule TAKE ONE CAPSULE BY MOUTH DAILY **KEEP DOCTOR APPOINTMENT FOR FURTHER REFILLS** (Patient taking differently: 40 mg daily as needed (acid eflux).) 30 capsule 1 12/15/2021  ? rosuvastatin (CRESTOR) 10 MG tablet Take 1 tablet Daily for Cholesterol (Patient taking differently: Take 10 mg by mouth daily.) 90 tablet 1 12/15/2021  ? sertraline (ZOLOFT) 100 MG tablet Take 1.5 tablets (150 mg total) by mouth daily. (Patient taking differently: Take 100 mg by mouth daily.) 45 tablet 3 Past Week  ? ?Scheduled:  ? ARIPiprazole  10 mg Oral Daily  ? bisoprolol-hydrochlorothiazide  1 tablet Oral Daily  ? docusate sodium  100 mg Oral BID  ? levothyroxine  100 mcg Oral Q0600  ? polyethylene glycol  17 g Oral Daily  ? sertraline  100 mg Oral Daily  ? ?Infusions:  ? cefTRIAXone (ROCEPHIN)  IV 2 g (12/16/21 1636)  ? And  ? metronidazole 500 mg (12/17/21 0535)  ? heparin 1,800 Units/hr (12/17/21 0433)  ? ? ?Assessment: ?35 yoM presented on 3/2 with abdominal pain, N/V. PMH significant for previous appendicitis, previous primary  sclerosing cholangitis with gallbladder removal last year, and Protein S deficiency w/ hx SMV thrombosis on chronic Eliquis. Eliquis is now held for possible procedures. Pharmacy is consulted to dose Heparin IV.  ?Last apixaban dose on 3/2 morning. ? ?Baseline heparin level 0.52-falsely elevated d/t recent DOAC exposure.  Plan to use aPTT for heparin monitoring and dose titration. ?CBC:  Hgb and Plt WNL ?SCr 1.11 ? ?12/17/2021: ?aPTT 87 - therapeutic on heparin at 1800 units/hr ?CBC stable ?No bleeding or infusion related concerns noted ? ?Goal of Therapy:  ?Heparin level 0.3-0.7 units/ml ?aPTT 66-102 seconds ?Monitor platelets by anticoagulation protocol: Yes ?  ?Plan:  ?Continue heparin infusion at 1800 units/hr ?Check confirmatory aPTT at 1600 ?Daily aPTT, heparin level, and CBC ?Continue to monitor H&H and platelets ? ?Tawnya Crook, PharmD, BCPS ?Clinical Pharmacist ?12/17/2021 12:07 PM ? ? ? ?

## 2021-12-17 NOTE — Plan of Care (Signed)

## 2021-12-17 NOTE — Progress Notes (Signed)
ANTICOAGULATION CONSULT NOTE ? 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 ?Allergies  ?Allergen Reactions  ? Tylenol [Acetaminophen] Other (See Comments)  ?  r/t elevated LFTs ?  ? ? ?Patient Measurements: ?Height: '6\' 3"'$  (190.5 cm) ?Weight: 126.3 kg (278 lb 7.1 oz) ?IBW/kg (Calculated) : 84.5 ?Heparin Dosing Weight: 112 kg ? ?Vital Signs: ?Temp: 98 ?F (36.7 ?C) (03/03 1950) ?BP: 135/72 (03/03 1950) ?Pulse Rate: 93 (03/03 1950) ? ?Labs: ?Recent Labs  ?  12/15/21 ?1315 12/15/21 ?1315 12/15/21 ?2047 12/16/21 ?0451 12/16/21 ?1152 12/16/21 ?1812 12/17/21 ?0135  ?HGB 13.9  --   --  13.0  12.8*  --   --  12.2*  ?HCT 41.5  --   --  39.6  39.3  --   --  38.1*  ?PLT 183  --   --  159  158  --   --  141*  ?APTT  --    < > 29 65* 91* 111* 120*  ?LABPROT  --   --   --  14.6  --   --   --   ?INR  --   --   --  1.1  --   --   --   ?HEPARINUNFRC  --   --  0.52 0.99*  --   --  0.68  ?CREATININE 1.11  --   --  1.03  --   --   --   ? < > = values in this interval not displayed.  ? ? ? ?Estimated Creatinine Clearance: 151.5 mL/min (by C-G formula based on SCr of 1.03 mg/dL). ? ?Medications:  ?Medications Prior to Admission  ?Medication Sig Dispense Refill Last Dose  ? amLODipine (NORVASC) 2.5 MG tablet Take 1 tablet (2.5 mg total) by mouth daily. 30 tablet 1 12/15/2021  ? apixaban (ELIQUIS) 5 MG TABS tablet TAKE 1 TABLET(5 MG) BY MOUTH TWICE DAILY (Patient taking differently: Take 5 mg by mouth 2 (two) times daily.) 180 tablet 0 12/15/2021 at 1000  ? ARIPiprazole (ABILIFY) 10 MG tablet Take 1 tablet (10 mg total) by mouth daily. 30 tablet 3 12/15/2021  ? bisoprolol-hydrochlorothiazide (ZIAC) 10-6.25 MG tablet LAST REFILL - Take 1 tablet Daily for BP - ABSOLUTELY  Must have Office Visit before Refill (Patient taking differently: Take 1 tablet by mouth daily.) 7 tablet 0 12/15/2021 at 1000  ? Cholecalciferol (VITAMIN D3) 5000 units CAPS Take 1 capsule (5,000 Units total)  by mouth every other day. (Patient taking differently: Take 5,000 Units by mouth daily.)   12/15/2021  ? ezetimibe (ZETIA) 10 MG tablet Take  1 tablet  Daily  for Cholesterol (Patient taking differently: Take 10 mg by mouth daily. Take  1 tablet  Daily  for Cholesterol) 90 tablet 1 Past Week  ? hydrOXYzine (ATARAX) 50 MG tablet TAKE ONE TABLET BY MOUTH THREE TIMES A DAY AS NEEDED FOR ANXIETY (Patient taking differently: Take 50 mg by mouth every 8 (eight) hours as needed for anxiety.) 90 tablet 1 Past Week  ? levothyroxine (SYNTHROID) 100 MCG tablet Take 1 tablet   Daily   on an empty stomach with only water for 30 minutes & no Antacid meds, Calcium or Magnesium for 4 hours & avoid Biotin (Patient taking differently: Take 100 mcg by mouth daily before breakfast.) 90 tablet 1 Past Week  ? omeprazole (PRILOSEC) 40 MG capsule TAKE ONE CAPSULE BY MOUTH DAILY **KEEP DOCTOR APPOINTMENT FOR FURTHER REFILLS** (Patient taking differently:  40 mg daily as needed (acid eflux).) 30 capsule 1 12/15/2021  ? rosuvastatin (CRESTOR) 10 MG tablet Take 1 tablet Daily for Cholesterol (Patient taking differently: Take 10 mg by mouth daily.) 90 tablet 1 12/15/2021  ? sertraline (ZOLOFT) 100 MG tablet Take 1.5 tablets (150 mg total) by mouth daily. (Patient taking differently: Take 100 mg by mouth daily.) 45 tablet 3 Past Week  ? ?Scheduled:  ? ARIPiprazole  10 mg Oral Daily  ? bisoprolol-hydrochlorothiazide  1 tablet Oral Daily  ? levothyroxine  100 mcg Oral Q0600  ? sertraline  100 mg Oral Daily  ? ?Infusions:  ? cefTRIAXone (ROCEPHIN)  IV 2 g (12/16/21 1636)  ? And  ? metronidazole 500 mg (12/16/21 1740)  ? heparin 1,950 Units/hr (12/17/21 0010)  ? lactated ringers 100 mL/hr at 12/16/21 2017  ? ? ?Assessment: ?70 yoM presented on 3/2 with abdominal pain, N/V. 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  Eliquis is now held for possible procedures. Pharmacy is consulted to dose Heparin IV.  ?Last apixaban dose on 3/2 morning. ? ?Baseline heparin level 0.52-falsely elevated d/t recent DOAC exposure.  Plan to use aPTT for heparin monitoring and dose titration. ?CBC:  Hgb and Plt WNL ?SCr 1.11 ? ?12/17/2021 PM UPDATE: ?aPTT supratherapeutic at 120 seconds on 1950 units/hr-->level increased despite rate decrease ?Confirmed that aPTT was drawn from arm opposite heparin infusion ?No bleeding or infusion related concerns per RN ? ?Goal of Therapy:  ?Heparin level 0.3-0.7 units/ml ?aPTT 66-102 seconds ?Monitor platelets by anticoagulation protocol: Yes ?  ?Plan:  ?Hold heparin x1 hr ?At 0430, resume heparin infusion at 1800 units/hr ?Recheck aPTT at 1030 ?Daily aPTT, heparin level, and CBC ?Continue to monitor H&H and platelets ? ?Netta Cedars, PharmD ?12/17/2021 3:22 AM ? ? ?

## 2021-12-17 NOTE — Progress Notes (Signed)
?  Progress Note ? ? ?Patient: Kyle Gonzales OJJ:[id number] DOB: 22-Jun-1991 DOA: 12/15/2021     2 ?DOS: the patient was seen and examined on 12/17/2021 ?  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 ?--3/4 continue medical management as per general surgery ? ?Assessment and Plan: ?* Acute appendicitis ?--CT imaging suggestive of acute appendicitis.  Has history of perforated appendicitis with abscess 04/2020.  ?--Continue empiric antibiotics, management per general surgery.  Conservative at this time.   ?--Continue heparin infusion ? ?Primary sclerosing cholangitis ?Follows with [facility] and [facility], Dr. Havery Moros.  Has mild elevation in AST and ALT. Chronic and postcholecystectomy changes noted on CT. ? 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 ?-- Eliquis on hold, continue heparin infusion ? ?Benign essential HTN ?-- Stable.  Continue home bisoprolol-HCTZ. ? ?Hypothyroidism ?--Continue Synthroid. ? ?Hyperlipidemia, mixed ?--Rosuvastatin and Zetia on hold for now. ? ?Depression with anxiety ?--Continue Abilify, sertraline, Atarax as needed. ? ? ? ? ? ? ? ?Subjective:  ?Feels ok, passing gas, some abd pain ? ?Physical Exam: ?Vitals:  ? 12/16/21 1322 12/16/21 1950 12/17/21 0533 12/17/21 1322  ?BP: 119/66 135/72 (!) 153/76 128/74  ?Pulse: 96 93 94 83  ?Resp: '20 16 16 18  '$ ?Temp: 98.4 ?F (36.9 ?C) 98 ?F (36.7 ?C) 98 ?F (36.7 ?C) 98.4 ?F (36.9 ?C)  ?TempSrc: Oral  Oral Oral  ?SpO2: 98% 98% 96%   ?Weight:      ?Height:      ? ?Physical Exam ?Vitals reviewed.  ?Constitutional:   ?   General: He is not in acute  distress. ?   Appearance: He is not ill-appearing or toxic-appearing.  ?Cardiovascular:  ?   Rate and Rhythm: Normal rate and regular rhythm.  ?   Heart sounds: No murmur heard. ?Pulmonary:  ?   Effort: Pulmonary effort is normal. No respiratory distress.  ?   Breath sounds: No wheezing, rhonchi or rales.  ?Abdominal:  ?   Palpations: Abdomen is soft.  ?Neurological:  ?   Mental Status: He is alert.  ?Psychiatric:     ?   Mood and Affect: Mood normal.     ?   Behavior: Behavior normal.  ? ? ? ?Data Reviewed: ? ?BMP noted ?AST 70, ALT 87 ?WBC 8.6 ? ?Family Communication: none ? ?Disposition: ?Status is: Inpatient ?Remains inpatient appropriate because: acute appendicitis ? ? ? ? ? ? ? ? ? Planned Discharge Destination: Home ? ? ? ? ?Time spent: 20 minutes ? ?Author: ?Murray Hodgkins, MD ?12/17/2021 3:00 PM ? ?For on call review [email or web address].  ? ?

## 2021-12-17 NOTE — Progress Notes (Signed)
? ?Subjective/Chief Complaint: ?Feels better, no real bowel function, no n/v tol clears ? ? ?Objective: ?Vital signs in last 24 hours: ?Temp:  [98 ?F (36.7 ?C)-98.4 ?F (36.9 ?C)] 98 ?F (36.7 ?C) (03/04 0533) ?Pulse Rate:  [93-96] 94 (03/04 0533) ?Resp:  [16-20] 16 (03/04 0533) ?BP: (119-153)/(66-76) 153/76 (03/04 0533) ?SpO2:  [96 %-98 %] 96 % (03/04 0533) ?Last BM Date : 12/15/21 ? ?Intake/Output from previous day: ?03/03 0701 - 03/04 0700 ?In: 1454.8 [P.O.:840; I.V.:451.8; IV Piggyback:163] ?Out: 2 [Urine:2] ?Intake/Output this shift: ?No intake/output data recorded. ? ?Gen:  Alert, NAD ?Abd: Soft, ND, mild RLQ TTP  ? ?Lab Results:  ?Recent Labs  ?  12/16/21 ?0451 12/17/21 ?7517  ?WBC 8.5  8.5 8.6  ?HGB 13.0  12.8* 12.2*  ?HCT 39.6  39.3 38.1*  ?PLT 159  158 141*  ? ?BMET ?Recent Labs  ?  12/16/21 ?0451 12/17/21 ?0017  ?NA 134* 134*  ?K 3.5 3.6  ?CL 97* 97*  ?CO2 26 29  ?GLUCOSE 121* 111*  ?BUN 12 10  ?CREATININE 1.03 1.09  ?CALCIUM 9.3 9.0  ? ?PT/INR ?Recent Labs  ?  12/16/21 ?0451  ?LABPROT 14.6  ?INR 1.1  ? ?ABG ?No results for input(s): PHART, HCO3 in the last 72 hours. ? ?Invalid input(s): PCO2, PO2 ? ?Studies/Results: ?CT ABDOMEN PELVIS W CONTRAST ? ?Result Date: 12/15/2021 ?CLINICAL DATA:  Epigastric pain, nausea, vomiting. Previous cholecystectomy EXAM: CT ABDOMEN AND PELVIS WITH CONTRAST TECHNIQUE: Multidetector CT imaging of the abdomen and pelvis was performed using the standard protocol following bolus administration of intravenous contrast. RADIATION DOSE REDUCTION: This exam was performed according to the departmental dose-optimization program which includes automated exposure control, adjustment of the mA and/or kV according to patient size and/or use of iterative reconstruction technique. CONTRAST:  17m OMNIPAQUE IOHEXOL 300 MG/ML  SOLN COMPARISON:  MR 10/22/2021 and previous FINDINGS: Lower chest: No pleural or pericardial effusion. Hepatobiliary: Stable moderate central intrahepatic  biliary ductal dilatation. The CBD is normal in caliber. 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 2.9 cm subcapsular hemangioma in hepatic segment 5/6, and 1.8 cm subcapsular lesion in segment 5, previously characterized as hemangioma on MRI. No new liver lesion. Post cholecystectomy. Pancreas: Unremarkable. No pancreatic ductal dilatation or surrounding inflammatory changes. Spleen: Normal in size without focal abnormality. Adrenals/Urinary Tract: Adrenal glands are unremarkable. Kidneys are normal, without renal calculi, focal lesion, or hydronephrosis. Bladder is unremarkable. Stomach/Bowel: Stomach is distended by ingested material. Small bowel decompressed. The colon is nondilated. Appendix: Location: Posteromedial to cecum extending towards midline Diameter: 1.7 cm Appendicolith: None seen Mucosal hyper-enhancement: Mild, near the base Extraluminal gas: None Periappendiceal collection: None. Mild regional inflammatory changes Vascular/Lymphatic: Chronic cavernous transformation of portal vein. No arterial pathology identified. No abdominal or pelvic adenopathy localized. Reproductive: Prostate is unremarkable. Other: No ascites.  No free air. Musculoskeletal: No acute findings. IMPRESSION: 1. CT findings of acute appendicitis, without perforation or abscess. Critical Value/emergent results were called by telephone at the time of interpretation on 12/15/2021 at 4:43 pm [facility] these results. 2. 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 3. Benign hepatic hemangiomas again noted. Electronically Signed   By: DLucrezia EuropeM.D.   On: 12/15/2021 16:45   ? ?Anti-infectives: ?Anti-infectives (From admission, onward)  ? ? Start     Dose/Rate Route Frequency Ordered Stop  ? 12/16/21 1700  cefTRIAXone (ROCEPHIN) 2 g in  sodium chloride 0.9 % 100 mL IVPB        ?See Hyperspace for full Linked Orders Report.  ? 2 g ?200 mL/hr over 30 Minutes Intravenous Every 24 hours 12/15/21 1938    ? 12/16/21 0500  metroNIDAZOLE (FLAGYL) IVPB 500 mg       ?See Hyperspace for full Linked Orders Report.  ? 500 mg ?100 mL/hr over 60 Minutes Intravenous Every 12 hours 12/15/21 1938    ? 12/15/21 1700  cefTRIAXone (ROCEPHIN) 2 g in sodium chloride 0.9 % 100 mL IVPB       ?See Hyperspace for full Linked Orders Report.  ? 2 g ?200 mL/hr over 30 Minutes Intravenous  Once 12/15/21 1657 12/15/21 1756  ? 12/15/21 1700  metroNIDAZOLE (FLAGYL) IVPB 500 mg       ?See Hyperspace for full Linked Orders Report.  ? 500 mg ?100 mL/hr over 60 Minutes Intravenous  Once 12/15/21 1657 12/15/21 1948  ? ?  ? ? ?Assessment/Plan: ?Right lower quadrant pain with nausea and vomiting ?Probable appendicitis ?- h/o Laparoscopic debridement of abscess containing yellow pus and thick fibrinous exudates 05/11/20 by Dr. Hassell Done for Chronic ruptured appendicitis with abscess: extensive perforated appendicitis with pus and distorted anatomy and a tubular appearing structure was resected.  On final pathology report there was no tubular structure identified so he just had probably part of the abscess cavity debrided and resected and he still has his full appendix intact ?- CT 3/2 shows acute appendicitis without perforation or abscess ?- Plan nonoperative management acutely unless patient decompensates. Continue IV antibiotics and follow closely. If the patient improves will plan for outpatient colonoscopy as well as follow up with one of our colorectal surgeons to discuss appendectomy with possible ileocecectomy with probable ureteral stent placement by urology. Continue holding eliquis in case he requires surgery this admission, but hopefully we can avoid.  ?  ?ID - rocephin/ flagyl 3/2>> ?FEN - IVF, fulls, bowel regimen ?VTE - heparin gtt ?Foley - none ?  ?Protein S deficiency on chronic Eliquis (last dose 3/2 in  AM) ?History of SMV thrombosis ?PSC ?Hypertension ?History of Graves' disease status post elation with resultant hypothyroidism ?Elevated transaminases ?  ?I reviewed hospitalist notes, last 24 h vitals and pain scores, last 48 h intake and output, last 24 h labs and trends, and last 24 h imaging results. ?  ?This care required moderate level of medical decision making.  ?  ?  ? ?Rolm Bookbinder ?12/17/2021 ? ?

## 2021-12-17 NOTE — Progress Notes (Signed)
Pharmacy: Re- heparin ? ?Patient's a 31 y.o with hx Protein S deficiency and SMV thrombosis on Eliquis PTA, presented to the ED on 12/15/21 with c/o abdominal pain and n/v.  He was subsequently found to have acute appendicitis and transitioned to heparin drip in case surgical intervention is needed. ? ?- confirmatory aPTT level collected at 4p remains therapeutic at 90 secs ? ?Goal of Therapy:  ?Heparin level 0.3-0.7  ?aPTT 66-102 seconds ?Monitor platelets by anticoagulation protocol: Yes ? ?Plan: ?- continue heparin drip at 1800 units/hr ?- f/u with AM heparin level and aPTT and adjust rate was needed ?- monitor for s/sx bleeding ? ?Dia Sitter, PharmD, BCPS ?12/17/2021 5:36 PM ? ?

## 2021-12-18 DIAGNOSIS — K3589 Other acute appendicitis without perforation or gangrene: Secondary | ICD-10-CM | POA: Diagnosis not present

## 2021-12-18 DIAGNOSIS — K55069 Acute infarction of intestine, part and extent unspecified: Secondary | ICD-10-CM | POA: Diagnosis not present

## 2021-12-18 LAB — CBC
HCT: 39.3 % (ref 39.0–52.0)
Hemoglobin: 12.6 g/dL — ABNORMAL LOW (ref 13.0–17.0)
MCH: 30.4 pg (ref 26.0–34.0)
MCHC: 32.1 g/dL (ref 30.0–36.0)
MCV: 94.7 fL (ref 80.0–100.0)
Platelets: 164 10*3/uL (ref 150–400)
RBC: 4.15 MIL/uL — ABNORMAL LOW (ref 4.22–5.81)
RDW: 14 % (ref 11.5–15.5)
WBC: 10.3 10*3/uL (ref 4.0–10.5)
nRBC: 0 % (ref 0.0–0.2)

## 2021-12-18 LAB — APTT: aPTT: 65 seconds — ABNORMAL HIGH (ref 24–36)

## 2021-12-18 LAB — HEPARIN LEVEL (UNFRACTIONATED): Heparin Unfractionated: 0.29 IU/mL — ABNORMAL LOW (ref 0.30–0.70)

## 2021-12-18 MED ORDER — APIXABAN 5 MG PO TABS
5.0000 mg | ORAL_TABLET | Freq: Two times a day (BID) | ORAL | Status: DC
Start: 2021-12-18 — End: 2021-12-19
  Administered 2021-12-18 (×2): 5 mg via ORAL
  Filled 2021-12-18 (×3): qty 1

## 2021-12-18 MED ORDER — CEFDINIR 300 MG PO CAPS
300.0000 mg | ORAL_CAPSULE | Freq: Two times a day (BID) | ORAL | Status: DC
  Administered 2021-12-18: 300 mg via ORAL
  Filled 2021-12-18 (×2): qty 1

## 2021-12-18 MED ORDER — HEPARIN (PORCINE) 25000 UT/250ML-% IV SOLN
1900.0000 [IU]/h | INTRAVENOUS | Status: DC
  Administered 2021-12-18: 1900 [IU]/h via INTRAVENOUS
  Filled 2021-12-18: qty 250

## 2021-12-18 MED ORDER — METRONIDAZOLE 500 MG PO TABS
500.0000 mg | ORAL_TABLET | Freq: Two times a day (BID) | ORAL | Status: DC
  Administered 2021-12-18: 500 mg via ORAL
  Filled 2021-12-18 (×2): qty 1

## 2021-12-18 MED ORDER — HEPARIN (PORCINE) 25000 UT/250ML-% IV SOLN
1850.0000 [IU]/h | INTRAVENOUS | Status: DC
  Filled 2021-12-18: qty 250

## 2021-12-18 NOTE — Progress Notes (Signed)
ANTICOAGULATION CONSULT NOTE ? 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 ?Allergies  ?Allergen Reactions  ? Tylenol [Acetaminophen] Other (See Comments)  ?  r/t elevated LFTs ?  ? ? ?Patient Measurements: ?Height: '6\' 3"'$  (190.5 cm) ?Weight: 126.3 kg (278 lb 7.1 oz) ?IBW/kg (Calculated) : 84.5 ?Heparin Dosing Weight: 112 kg ? ?Vital Signs: ?Temp: 98.8 ?F (37.1 ?C) (03/05 0242) ?Temp Source: Oral (03/05 0242) ?BP: 116/82 (03/05 0242) ?Pulse Rate: 79 (03/05 0242) ? ?Labs: ?Recent Labs  ?  12/15/21 ?1315 12/15/21 ?2047 12/16/21 ?0451 12/16/21 ?1152 12/17/21 ?0135 12/17/21 ?1019 12/17/21 ?1605 12/18/21 ?5449  ?HGB 13.9  --  13.0  12.8*  --  12.2*  --   --  12.6*  ?HCT 41.5  --  39.6  39.3  --  38.1*  --   --  39.3  ?PLT 183  --  159  158  --  141*  --   --  164  ?APTT  --    < > 65*   < > 120* 87* 90* 65*  ?LABPROT  --   --  14.6  --   --   --   --   --   ?INR  --   --  1.1  --   --   --   --   --   ?HEPARINUNFRC  --    < > 0.99*  --  0.68  --   --  0.29*  ?CREATININE 1.11  --  1.03  --  1.09  --   --   --   ? < > = values in this interval not displayed.  ? ? ? ?Estimated Creatinine Clearance: 143.1 mL/min (by C-G formula based on SCr of 1.09 mg/dL). ? ? ?Assessment: ?59 yoM presented on 3/2 with abdominal pain, N/V. 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 Eliquis is now held for possible procedures. Pharmacy is consulted to dose IV heparin. Last apixaban dose on 3/2 at 1000.  ? ?Baseline heparin level 0.52 units/mL-falsely elevated d/t recent DOAC exposure, so initially will use aPTT for heparin monitoring and dose titration until effects of DOAC on heparin level have diminished.  ? ?Baseline CBC:  Hgb and Pltc WNL; SCr 1.11 ? ?12/18/2021: ?aPTT = 65 seconds, slightly subtherapeutic on heparin infusion at  1800 units/hr ?Heparin level = 0.29 units/mL, also slightly subtherapeutic  ?CBC: Hgb 12.6, slightly low but stable. Pltc improved to WNL.  ?No bleeding or infusion related concerns noted per nursing  ? ?Goal of Therapy:  ?Heparin level 0.3-0.7 units/ml ?aPTT 66-102 seconds ?Monitor platelets by anticoagulation protocol: Yes ?  ?Plan:  ?Increase heparin infusion to 1900 units/hr ?Will use heparin levels for dose titration moving forward now that heparin level and aPTT correlate  ?Check heparin level 6 hours after rate change ?Daily CBC, heparin level ?Monitor closely for s/sx of bleeding ? ? ?Lindell Spar, PharmD, BCPS ?Clinical Pharmacist ?12/18/2021 7:03 AM ? ? ? ?

## 2021-12-18 NOTE — Plan of Care (Signed)

## 2021-12-18 NOTE — Progress Notes (Signed)
? ?Subjective/Chief Complaint: ?Feels fine today, tol fulls, having bowel function ? ? ?Objective: ?Vital signs in last 24 hours: ?Temp:  [98.4 ?F (36.9 ?C)-98.8 ?F (37.1 ?C)] 98.8 ?F (37.1 ?C) (03/05 0242) ?Pulse Rate:  [79-87] 79 (03/05 0242) ?Resp:  [16-18] 16 (03/05 0242) ?BP: (116-135)/(74-82) 116/82 (03/05 0242) ?SpO2:  [96 %-98 %] 98 % (03/05 0242) ?Last BM Date : 12/17/21 ? ?Intake/Output from previous day: ?03/04 0701 - 03/05 0700 ?In: 524.5 [P.O.:240; I.V.:184.5; IV Piggyback:100] ?Out: -  ?Intake/Output this shift: ?No intake/output data recorded. ? ?Gen:  Alert, NAD ?Abd: Soft, ND, mild RLQ TTP  ? ?Lab Results:  ?Recent Labs  ?  12/17/21 ?3149 12/18/21 ?7026  ?WBC 8.6 10.3  ?HGB 12.2* 12.6*  ?HCT 38.1* 39.3  ?PLT 141* 164  ? ?BMET ?Recent Labs  ?  12/16/21 ?0451 12/17/21 ?3785  ?NA 134* 134*  ?K 3.5 3.6  ?CL 97* 97*  ?CO2 26 29  ?GLUCOSE 121* 111*  ?BUN 12 10  ?CREATININE 1.03 1.09  ?CALCIUM 9.3 9.0  ? ?PT/INR ?Recent Labs  ?  12/16/21 ?0451  ?LABPROT 14.6  ?INR 1.1  ? ?ABG ?No results for input(s): PHART, HCO3 in the last 72 hours. ? ?Invalid input(s): PCO2, PO2 ? ?Studies/Results: ?No results found. ? ?Anti-infectives: ?Anti-infectives (From admission, onward)  ? ? Start     Dose/Rate Route Frequency Ordered Stop  ? 12/16/21 1700  cefTRIAXone (ROCEPHIN) 2 g in sodium chloride 0.9 % 100 mL IVPB       ?See Hyperspace for full Linked Orders Report.  ? 2 g ?200 mL/hr over 30 Minutes Intravenous Every 24 hours 12/15/21 1938    ? 12/16/21 0500  metroNIDAZOLE (FLAGYL) IVPB 500 mg       ?See Hyperspace for full Linked Orders Report.  ? 500 mg ?100 mL/hr over 60 Minutes Intravenous Every 12 hours 12/15/21 1938    ? 12/15/21 1700  cefTRIAXone (ROCEPHIN) 2 g in sodium chloride 0.9 % 100 mL IVPB       ?See Hyperspace for full Linked Orders Report.  ? 2 g ?200 mL/hr over 30 Minutes Intravenous  Once 12/15/21 1657 12/15/21 1756  ? 12/15/21 1700  metroNIDAZOLE (FLAGYL) IVPB 500 mg       ?See Hyperspace for full  Linked Orders Report.  ? 500 mg ?100 mL/hr over 60 Minutes Intravenous  Once 12/15/21 1657 12/15/21 1948  ? ?  ? ? ?Assessment/Plan: ?Recurrent appendicitis ?- h/o Laparoscopic debridement of abscess containing yellow pus and thick fibrinous exudates 05/11/20 by Dr. Hassell Done for Chronic ruptured appendicitis with abscess: extensive perforated appendicitis with pus and distorted anatomy and a tubular appearing structure was resected.  On final pathology report there was no tubular structure identified so he just had probably part of the abscess cavity debrided and resected and he still has his full appendix intact ?- CT 3/2 shows acute appendicitis without perforation or abscess ?- Plan at admission was nonoperative and he is improved as of now.  He will need to have surgery however. He is at risk of recurrence even in short term.  Will follow up in our office upon discharge.   ?-will advance diet today to regular ?-can transition to oral abx ?-can stop heparin and restart eliquis as well in anticipation of possible discharge ?  ?ID - rocephin/ flagyl 3/2>> ?FEN - IVF, regularbowel regimen ?VTE - heparin gtt- fine to restart eliquis ?Foley - none ?  ?Protein S deficiency on chronic Eliquis (last dose 3/2 in  AM) ?History of SMV thrombosis ?PSC ?Hypertension ?History of Graves' disease status post elation with resultant hypothyroidism ?  ?I reviewed hospitalist notes, last 24 h vitals and pain scores, last 48 h intake and output, last 24 h labs and trends, and last 24 h imaging results. ?  ?This care required moderate level of medical decision making.  ?  ? ? ?Rolm Bookbinder ?12/18/2021 ? ?

## 2021-12-18 NOTE — Progress Notes (Signed)
?  Progress Note ? ? ?Patient: Kyle Gonzales QMG:[id number] DOB: Apr 16, 1991 DOA: 12/15/2021     3 ?DOS: the patient was seen and examined on 12/18/2021 ?  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  Manage meant per surgery has been considered renal antibiotics. ?--3/5 continue medical management as per general surgery -- now on regular diet, anticipate discharge 3/6 ? ?Assessment and Plan: ?* Acute appendicitis ?--CT imaging suggestive of acute appendicitis.  Has history of perforated appendicitis with abscess 04/2020.  ?--Continue empiric antibiotics, management per general surgery.  Conservative at this time.   ?--per surgery, diet advanced, can change to oral antibiotics and resume apixaban. If stable, home tomorrow per surgery. ? ?Primary sclerosing cholangitis ?Follows with [facility] and [facility], Dr. Havery Moros.  Has mild elevation in AST and ALT. Chronic and postcholecystectomy changes noted on CT. ? ?Mesenteric thrombosis ([place]) ?--On chronic anticoagulation with Eliquis due to history of SMV thrombosis in setting of protein S deficiency. ?--stop heparin infusion, start apixaban ? ?Benign essential HTN ?-- Stable.  Continue home bisoprolol-HCTZ. ? ?Hypothyroidism ?--Continue Synthroid. ? ?Hyperlipidemia, mixed ?--Rosuvastatin and Zetia on hold for now. ? ?Depression with anxiety ?--Continue Abilify, sertraline, Atarax as needed. ? ? ? ? ? ? ? ?Subjective:  ?Feels better, a little abd pain ? ?Physical Exam: ?Vitals:  ? 12/17/21 1322 12/17/21 2028 12/18/21 0242 12/18/21 1401  ?BP: 128/74 135/82 116/82 120/78  ?Pulse: 83 87 79 88  ?Resp: '18 16 16 16  '$ ?Temp: 98.4 ?F (36.9 ?C) 98.7 ?F (37.1 ?C) 98.8 ?F  (37.1 ?C) 98.6 ?F (37 ?C)  ?TempSrc: Oral Oral Oral Oral  ?SpO2:  96% 98% 95%  ?Weight:      ?Height:      ? ?Physical Exam ?Vitals reviewed.  ?Constitutional:   ?   General: He is not in acute distress. ?   Appearance: He is not ill-appearing or toxic-appearing.  ?Cardiovascular:  ?   Rate and Rhythm: Normal rate and regular rhythm.  ?   Heart sounds: No murmur heard. ?Pulmonary:  ?   Effort: Pulmonary effort is normal. No respiratory distress.  ?   Breath sounds: No wheezing, rhonchi or rales.  ?Abdominal:  ?   Palpations: Abdomen is soft.  ?Neurological:  ?   Mental Status: He is alert.  ?Psychiatric:     ?   Mood and Affect: Mood normal.     ?   Behavior: Behavior normal.  ? ? ? ?Data Reviewed: ? ?Hgb stable 12.6 ?WBC stable 10.3 ?Plts 164 ? ?Family Communication: none ? ?Disposition: ?Status is: Inpatient ?Remains inpatient appropriate because: acute appendicitis  ? ? ? ? ? ? ? ? ? Planned Discharge Destination: Home ? ? ? ? ?Time spent: 20 minutes ? ?Author: ?Murray Hodgkins, MD ?12/18/2021 2:16 PM ? ?For on call review [email or web address].  ? ?

## 2021-12-19 ENCOUNTER — Telehealth: Payer: Self-pay | Admitting: Gastroenterology

## 2021-12-19 DIAGNOSIS — K55069 Acute infarction of intestine, part and extent unspecified: Secondary | ICD-10-CM | POA: Diagnosis not present

## 2021-12-19 DIAGNOSIS — K8301 Primary sclerosing cholangitis: Secondary | ICD-10-CM | POA: Diagnosis not present

## 2021-12-19 DIAGNOSIS — K3589 Other acute appendicitis without perforation or gangrene: Secondary | ICD-10-CM | POA: Diagnosis not present

## 2021-12-19 LAB — CBC
HCT: 38.8 % — ABNORMAL LOW (ref 39.0–52.0)
Hemoglobin: 12.5 g/dL — ABNORMAL LOW (ref 13.0–17.0)
MCH: 30.5 pg (ref 26.0–34.0)
MCHC: 32.2 g/dL (ref 30.0–36.0)
MCV: 94.6 fL (ref 80.0–100.0)
Platelets: 193 10*3/uL (ref 150–400)
RBC: 4.1 MIL/uL — ABNORMAL LOW (ref 4.22–5.81)
RDW: 13.7 % (ref 11.5–15.5)
WBC: 10.4 10*3/uL (ref 4.0–10.5)
nRBC: 0 % (ref 0.0–0.2)

## 2021-12-19 MED ORDER — OXYCODONE HCL 5 MG PO TABS: 5.0000 mg | ORAL_TABLET | Freq: Four times a day (QID) | ORAL | 0 refills | Status: DC | PRN

## 2021-12-19 MED ORDER — METRONIDAZOLE 500 MG PO TABS: 500.0000 mg | ORAL_TABLET | Freq: Two times a day (BID) | ORAL | 0 refills | Status: AC

## 2021-12-19 MED ORDER — CEFDINIR 300 MG PO CAPS: 300.0000 mg | ORAL_CAPSULE | Freq: Two times a day (BID) | ORAL | 0 refills | Status: AC

## 2021-12-19 NOTE — Progress Notes (Addendum)
? ?Subjective/Chief Complaint: ?No complaints.  Tolerating regular diet.  Minimal RLQ pain. ? ? ?Objective: ?Vital signs in last 24 hours: ?Temp:  [97.6 ?F (36.4 ?C)-98.9 ?F (37.2 ?C)] 98.9 ?F (37.2 ?C) (03/06 0427) ?Pulse Rate:  [73-88] 73 (03/06 0427) ?Resp:  [16] 16 (03/06 0427) ?BP: (120-148)/(78-108) 148/108 (03/06 0427) ?SpO2:  [95 %-98 %] 98 % (03/06 0427) ?Last BM Date : 12/18/21 ? ?Intake/Output from previous day: ?03/05 0701 - 03/06 0700 ?In: 600 [P.O.:600] ?Out: -  ?Intake/Output this shift: ?No intake/output data recorded. ? ?Gen:  Alert, NAD ?Abd: Soft, ND, mild RLQ TTP  ? ?Lab Results:  ?Recent Labs  ?  12/18/21 ?8338 12/19/21 ?0450  ?WBC 10.3 10.4  ?HGB 12.6* 12.5*  ?HCT 39.3 38.8*  ?PLT 164 193  ? ?BMET ?Recent Labs  ?  12/17/21 ?0135  ?NA 134*  ?K 3.6  ?CL 97*  ?CO2 29  ?GLUCOSE 111*  ?BUN 10  ?CREATININE 1.09  ?CALCIUM 9.0  ? ?PT/INR ?No results for input(s): LABPROT, INR in the last 72 hours. ? ?ABG ?No results for input(s): PHART, HCO3 in the last 72 hours. ? ?Invalid input(s): PCO2, PO2 ? ?Studies/Results: ?No results found. ? ?Anti-infectives: ?Anti-infectives (From admission, onward)  ? ? Start     Dose/Rate Route Frequency Ordered Stop  ? 12/18/21 1800  metroNIDAZOLE (FLAGYL) tablet 500 mg       ? 500 mg Oral Every 12 hours 12/18/21 0823    ? 12/18/21 1800  cefdinir (OMNICEF) capsule 300 mg       ? 300 mg Oral Every 12 hours 12/18/21 0825    ? 12/16/21 1700  cefTRIAXone (ROCEPHIN) 2 g in sodium chloride 0.9 % 100 mL IVPB  Status:  Discontinued       ?See Hyperspace for full Linked Orders Report.  ? 2 g ?200 mL/hr over 30 Minutes Intravenous Every 24 hours 12/15/21 1938 12/18/21 0825  ? 12/16/21 0500  metroNIDAZOLE (FLAGYL) IVPB 500 mg  Status:  Discontinued       ?See Hyperspace for full Linked Orders Report.  ? 500 mg ?100 mL/hr over 60 Minutes Intravenous Every 12 hours 12/15/21 1938 12/18/21 0823  ? 12/15/21 1700  cefTRIAXone (ROCEPHIN) 2 g in sodium chloride 0.9 % 100 mL IVPB        ?See Hyperspace for full Linked Orders Report.  ? 2 g ?200 mL/hr over 30 Minutes Intravenous  Once 12/15/21 1657 12/15/21 1756  ? 12/15/21 1700  metroNIDAZOLE (FLAGYL) IVPB 500 mg       ?See Hyperspace for full Linked Orders Report.  ? 500 mg ?100 mL/hr over 60 Minutes Intravenous  Once 12/15/21 1657 12/15/21 1948  ? ?  ? ? ?Assessment/Plan: ?Recurrent appendicitis ?- h/o Laparoscopic debridement of abscess containing yellow pus and thick fibrinous exudates 05/11/20 by Dr. Hassell Done for Chronic ruptured appendicitis with abscess: extensive perforated appendicitis with pus and distorted anatomy and a tubular appearing structure was resected.  On final pathology report there was no tubular structure identified so he just had probably part of the abscess cavity debrided and resected and he still has his full appendix intact ?- CT 3/2 shows acute appendicitis without perforation or abscess ?- Plan at admission was nonoperative and he is improved as of now.  He will need to have surgery however. He is at risk of recurrence even in short term.  Will follow up in our office upon discharge and they are working on this right now.   ?-tolerating regular diet ?-  omnicef/flagyl ?-surgically stable for DC home from our standpoint today ?-our office is working on follow up ?-would recommend 2 weeks total of abx therapy given his history etc ?-needs outpatient c-scope as previously arranged to further evaluate his colon and rule out bowel disease ?  ?ID - rocephin/ flagyl 3/2>>3/5, omnicef/flagyl 3/5--> ?FEN - IVF, regular, bowel regimen ?VTE - eliquis ?Foley - none ?  ?Protein S deficiency on chronic Eliquis (last dose 3/2 in AM) ?History of SMV thrombosis ?PSC ?Hypertension ?History of Graves' disease status post elation with resultant hypothyroidism ?  ?I reviewed hospitalist notes, last 24 h vitals and pain scores, last 48 h intake and output, last 24 h labs and trends, and last 24 h imaging results. ?  ?This care required  straightforward level of medical decision making.  ?  ? ? ?Henreitta Cea ?12/19/2021 ? ?

## 2021-12-19 NOTE — Telephone Encounter (Signed)
Returned call to patient's mother. Recently seen in the ED for epigastric pain, records in epic. Pt's mother reports that pt is currently on antibiotics because his appendix is so inflamed. Pt is scheduled to see [facility] on 01/09/22 for a f/u appt and consultation to discuss appendectomy. Pt's mother is wondering if it is OK to reschedule pt's colonoscopy to a later date? Please advise, thanks. ?

## 2021-12-19 NOTE — Telephone Encounter (Signed)
Inbound call from patient mother. States patient was recently in hospital 3/2-3/6 for epigastic pain. He will need to have his appendix removed and would like to discuss about possible need to reschedule  ?

## 2021-12-19 NOTE — Telephone Encounter (Signed)
[place] thanks for letting me know.  Sorry to hear that.  Yes if he just had appendicitis we should cancel his colonoscopy for this Friday.  He should continue his antibiotics and follow-up with surgery, perhaps we should reschedule his colonoscopy for sometime next month, give him time to heal up from his appendicitis.  Can you please take him off the schedule and reschedule his case?  Thanks ?

## 2021-12-19 NOTE — Discharge Summary (Signed)
Physician Discharge Summary   Patient: Kyle Gonzales MRN: [id number] DOB: [birth year]  Admit date:     12/15/2021  Discharge date: 12/19/21  Discharge Physician: Murray Hodgkins   PCP: [name], MD   Recommendations at discharge:    Follow-up recurrent appendicitis for consideration of elective appendectomy.   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  Manage ment per surgery was conservative with antibotics. Pt improved, tolerated diet, and was cleared for discharge on oral antibiotics with close outpatient follow-up with general surgery for consideration of elective appendectomy.   * Acute appendicitis --CT imaging suggestive of acute appendicitis.  Has history of perforated appendicitis with abscess 04/2020.  --Continue empiric antibiotics total 2 weeks, follow-up with general surgery.    Primary sclerosing cholangitis --Follows with [facility] and [facility], Dr.  Havery Moros.  Has mild elevation in AST and ALT. Chronic and postcholecystectomy changes noted on CT.  Mesenteric thrombosis (HCC) --On chronic anticoagulation with Eliquis due to history of SMV thrombosis in setting of protein S deficiency.  Benign essential HTN -- Stable.  Continue home bisoprolol-HCTZ.  Hypothyroidism --Continue Synthroid.  Hyperlipidemia, mixed --Rosuvastatin and Zetia can resume on discharge  Depression with anxiety --Continue Abilify,  sertraline, Atarax as needed.        Consultants: General Surgery Procedures performed: none  Disposition: Home Diet recommendation:  Discharge Diet Orders (From admission, onward)     Start     Ordered   12/19/21 0000  Diet general        12/19/21 0910           Regular diet DISCHARGE MEDICATION: Allergies as of 12/19/2021       Reactions   Tylenol [acetaminophen] Other (See Comments)   r/t elevated LFTs        Medication List     TAKE these medications    amLODipine 2.5 MG tablet Commonly known as: NORVASC Take 1 tablet (2.5 mg total) by mouth daily.   apixaban 5 MG Tabs tablet Commonly known as: Eliquis TAKE 1 TABLET(5 MG) BY MOUTH TWICE DAILY What changed:  how much to take how to take this when to take this additional instructions   ARIPiprazole 10 MG tablet Commonly known as: ABILIFY Take 1 tablet (10 mg total) by mouth daily.   bisoprolol-hydrochlorothiazide 10-6.25 MG tablet Commonly known as: ZIAC LAST REFILL - Take 1 tablet Daily for BP - ABSOLUTELY  Must have Office Visit before Refill What changed:  how much to take how to take this when to take this additional instructions   cefdinir 300 MG capsule Commonly known as: OMNICEF Take 1 capsule (300 mg total) by mouth every 12 (twelve) hours for 10 days.   ezetimibe 10 MG tablet Commonly known as: ZETIA Take  1 tablet  Daily  for Cholesterol What changed:  how much to take how to take this when to take this   hydrOXYzine 50 MG tablet Commonly known as: ATARAX TAKE ONE TABLET BY MOUTH THREE TIMES A DAY AS NEEDED FOR ANXIETY What changed:  when to take  this reasons to take this additional instructions   levothyroxine 100 MCG tablet Commonly known as: Synthroid Take 1 tablet   Daily   on an empty stomach with only water for 30 minutes & no Antacid meds, Calcium or Magnesium for 4 hours & avoid Biotin What changed:  how much to take how to take this when to take  this additional instructions   metroNIDAZOLE 500 MG tablet Commonly known as: FLAGYL Take 1 tablet (500 mg total) by mouth every 12 (twelve) hours for 10 days.   omeprazole 40 MG capsule Commonly known as: PRILOSEC TAKE ONE CAPSULE BY MOUTH DAILY **KEEP DOCTOR APPOINTMENT FOR FURTHER REFILLS** What changed: See the new instructions.   oxyCODONE 5 MG immediate release tablet Commonly known as: Oxy IR/ROXICODONE Take 1 tablet (5 mg total) by mouth every 6 (six) hours as needed for moderate pain.   rosuvastatin 10 MG tablet Commonly known as: Crestor Take 1 tablet Daily for Cholesterol What changed:  how much to take how to take this when to take this additional instructions   sertraline 100 MG tablet Commonly known as: ZOLOFT Take 1.5 tablets (150 mg total) by mouth daily. What changed: how much to take   Vitamin D3 125 MCG (5000 UT) Caps Take 1 capsule (5,000 Units total) by mouth every other day. What changed: when to take this        Follow-up Information     Michael Boston, MD Follow up on 01/09/2022.   Specialties: General Surgery, Colon and Rectal Surgery Why: 3:15pm, arrive by 2:45pm for paperwork and check in process.  please bring photo ID and insurance card with you. Contact information: [address] [place] [address] [phone]                Feels better Tolerating diet Mild abdominal pain  Discharge Exam: Danley Danker Weights   12/15/21 2100  Weight: 126.3 kg   Physical Exam Vitals reviewed.  Constitutional:      General: He is not in acute distress.    Appearance: He is not ill-appearing or toxic-appearing.  Cardiovascular:     Rate and Rhythm: Normal rate and regular rhythm.     Heart sounds: No murmur heard. Pulmonary:     Effort: Pulmonary effort is normal. No respiratory distress.     Breath sounds: No wheezing, rhonchi or rales.  Abdominal:     Palpations: Abdomen is soft.  Neurological:     Mental Status: He is  alert.  Psychiatric:        Mood and Affect: Mood normal.        Behavior: Behavior normal.     Condition at discharge: good  The results of significant diagnostics from this hospitalization (including imaging, microbiology, ancillary and laboratory) are listed below for reference.   Imaging Studies: CT ABDOMEN PELVIS W CONTRAST  Result Date: 12/15/2021 CLINICAL DATA:  Epigastric pain, nausea, vomiting. Previous cholecystectomy EXAM: CT ABDOMEN AND PELVIS WITH CONTRAST TECHNIQUE: Multidetector CT imaging of the abdomen and pelvis was performed using the standard protocol following bolus administration of intravenous contrast. RADIATION DOSE REDUCTION: This exam was performed according to the departmental dose-optimization program which includes automated exposure control, adjustment of the mA and/or kV according to patient size and/or use of iterative reconstruction technique. CONTRAST:  175m OMNIPAQUE IOHEXOL 300 MG/ML  SOLN COMPARISON:  MR 10/22/2021 and previous FINDINGS: Lower chest: No pleural or pericardial effusion. Hepatobiliary: Stable moderate central intrahepatic biliary ductal dilatation. The CBD is normal  in caliber. 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 2.9 cm subcapsular hemangioma in hepatic segment 5/6, and 1.8 cm subcapsular lesion in segment 5, previously characterized as hemangioma on MRI. No new liver lesion. Post cholecystectomy. Pancreas: Unremarkable. No pancreatic ductal dilatation or surrounding inflammatory changes. Spleen: Normal in size without focal abnormality. Adrenals/Urinary Tract: Adrenal glands are unremarkable. Kidneys are normal, without renal calculi, focal lesion, or hydronephrosis. Bladder is unremarkable. Stomach/Bowel: Stomach is distended by ingested material. Small bowel decompressed. The colon is nondilated. Appendix: Location: Posteromedial to cecum extending towards midline Diameter: 1.7 cm  Appendicolith: None seen Mucosal hyper-enhancement: Mild, near the base Extraluminal gas: None Periappendiceal collection: None. Mild regional inflammatory changes Vascular/Lymphatic: Chronic cavernous transformation of portal vein. No arterial pathology identified. No abdominal or pelvic adenopathy localized. Reproductive: Prostate is unremarkable. Other: No ascites.  No free air. Musculoskeletal: No acute findings. IMPRESSION: 1. CT findings of acute appendicitis, without perforation or abscess. Critical Value/emergent results were called by telephone at the time of interpretation on 12/15/2021 at 4:43 pm to provider [facility] , who verbally acknowledged these results. 2. 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 3. Benign hepatic hemangiomas again noted. Electronically Signed   By: Lucrezia Europe M.D.   On: 12/15/2021 16:45    Microbiology: Results for orders placed or performed during the hospital encounter of 12/15/21  Resp Panel by RT-PCR (Flu A&B, Covid) Nasopharyngeal Swab     Status: None   Collection Time: 12/15/21  1:18 PM   Specimen: Nasopharyngeal Swab; Nasopharyngeal(NP) swabs in vial transport medium  Result Value Ref Range Status   SARS Coronavirus 2 by RT PCR NEGATIVE NEGATIVE Final    Comment: (NOTE) SARS-CoV-2 target nucleic acids are NOT DETECTED.  The SARS-CoV-2 RNA is generally detectable in upper respiratory specimens during the acute phase of infection. The lowest concentration of SARS-CoV-2 viral copies this assay can detect is 138 copies/mL. A negative result does not preclude SARS-Cov-2 infection and should not be used as the sole basis for treatment or other patient management decisions. A negative result may occur with  improper specimen collection/handling, submission of specimen other than nasopharyngeal swab, presence of viral mutation(s) within the areas targeted by this assay, and inadequate  number of viral copies(<138 copies/mL). A negative result must be combined with clinical observations, patient history, and epidemiological information. The expected result is Negative.  Fact Sheet for Patients:  EntrepreneurPulse.com.au  Fact Sheet for Healthcare Providers:  IncredibleEmployment.be  This test is no t yet approved or cleared by the [place] FDA and  has been authorized for detection and/or diagnosis of SARS-CoV-2 by FDA under an Emergency Use Authorization (EUA). This EUA will remain  in effect (meaning this test can be used) for the duration of the COVID-19 declaration under Section 564(b)(1) of the Act, 21 U.S.C.section 360bbb-3(b)(1), unless the authorization is terminated  or revoked sooner.       Influenza A by PCR NEGATIVE NEGATIVE Final   Influenza B by PCR NEGATIVE NEGATIVE Final    Comment: (NOTE) The Xpert Xpress SARS-CoV-2/FLU/RSV plus assay is intended as an aid in the diagnosis of influenza from Nasopharyngeal swab specimens and should not be used as a sole basis for treatment. Nasal washings and aspirates are unacceptable for Xpert Xpress SARS-CoV-2/FLU/RSV testing.  Fact Sheet for Patients: EntrepreneurPulse.com.au  Fact Sheet for Healthcare Providers: IncredibleEmployment.be  This test is not yet approved or cleared by the [place]  States FDA and has been authorized for detection and/or diagnosis of SARS-CoV-2 by FDA under an Emergency Use Authorization (EUA). This EUA will remain in effect (meaning this test can be used) for the duration of the COVID-19 declaration under Section 564(b)(1) of the Act, 21 U.S.C. section 360bbb-3(b)(1), unless the authorization is terminated or revoked.  Performed at [facility], [place] [address] [place], [place] [address]     Labs: CBC: Recent Labs  Lab 12/15/21 1315 12/16/21 0451 12/17/21 0135  12/18/21 0537 12/19/21 0450  WBC 12.4* 8.5   8.5 8.6 10.3 10.4  HGB 13.9 13.0   12.8* 12.2* 12.6* 12.5*  HCT 41.5 39.6   39.3 38.1* 39.3 38.8*  MCV 92.4 94.7   93.6 95.3 94.7 94.6  PLT 183 159   158 141* 164 536   Basic Metabolic Panel: Recent Labs  Lab 12/15/21 1315 12/16/21 0451 12/17/21 0135  NA 135 134* 134*  K 4.4 3.5 3.6  CL 100 97* 97*  CO2 '25 26 29  '$ GLUCOSE 120* 121* 111*  BUN '12 12 10  '$ CREATININE 1.11 1.03 1.09  CALCIUM 9.4 9.3 9.0   Liver Function Tests: Recent Labs  Lab 12/15/21 1315 12/16/21 0451 12/17/21 0135  AST 85* 53* 70*  ALT 92* 77* 87*  ALKPHOS 44 39 49  BILITOT 1.2 0.8 1.5*  PROT 8.4* 7.8 7.6  ALBUMIN 4.8 4.5 4.2   CBG: No results for input(s): GLUCAP in the last 168 hours.  Discharge time spent: less than 30 minutes.  Signed: Murray Hodgkins, MD [facility] 12/19/2021

## 2021-12-20 ENCOUNTER — Encounter: Payer: Managed Care, Other (non HMO) | Admitting: Gastroenterology

## 2021-12-20 NOTE — Telephone Encounter (Signed)
Lm on vm for patient's mother to return call. 

## 2021-12-20 NOTE — Telephone Encounter (Signed)
Pt's mother returned call. I informed her of Dr. Doyne Keel recommendations. She states that she will see how the pt is feeling and they will call back to schedule his colonoscopy. Pt's mother was thankful for the return call and had no concerns at the end of the call. ?

## 2021-12-22 ENCOUNTER — Telehealth (HOSPITAL_COMMUNITY): Payer: No Payment, Other | Admitting: Psychiatry

## 2021-12-23 ENCOUNTER — Encounter: Payer: Managed Care, Other (non HMO) | Admitting: Gastroenterology

## 2021-12-29 ENCOUNTER — Ambulatory Visit: Payer: Managed Care, Other (non HMO) | Admitting: Internal Medicine

## 2021-12-29 NOTE — Progress Notes (Signed)
? ?  Patient No Showed again today  ? ?At last appointment, after multiple No Shows & ? last minute cancellations without valid reasons,  ?he was advised  ?any more  No Shows  would result in closing his chart.  ? ? ?

## 2022-01-02 ENCOUNTER — Other Ambulatory Visit: Payer: Self-pay | Admitting: Internal Medicine

## 2022-01-02 MED ORDER — TADALAFIL 20 MG PO TABS: ORAL_TABLET | ORAL | 0 refills | Status: DC

## 2022-01-09 ENCOUNTER — Ambulatory Visit: Payer: Self-pay | Admitting: Surgery

## 2022-01-09 ENCOUNTER — Other Ambulatory Visit: Payer: Self-pay | Admitting: Internal Medicine

## 2022-01-09 DIAGNOSIS — I10 Essential (primary) hypertension: Secondary | ICD-10-CM

## 2022-01-09 MED ORDER — BISOPROLOL-HYDROCHLOROTHIAZIDE 10-6.25 MG PO TABS: ORAL_TABLET | ORAL | 0 refills | Status: DC

## 2022-01-11 ENCOUNTER — Telehealth: Payer: Self-pay

## 2022-01-11 DIAGNOSIS — K8301 Primary sclerosing cholangitis: Secondary | ICD-10-CM

## 2022-01-11 NOTE — Telephone Encounter (Signed)
----- Message from Kyle Flock, MD sent at 01/10/2022  5:36 PM EDT ----- ?Regarding: RE: Recurrent appendicitis with h/o PSC - ?Colonoscopy preop ?Okay that sounds fine to me. ? ?Kyle Gonzales can you help coordinate with the [facility] surgery schedulers a day in May where this patient can have a colonoscopy with me and next day surgery with Dr. Johney Gonzales? Thanks ? ?Thanks Kyle Gonzales ?----- Message ----- ?From: Kyle Boston, MD ?Sent: 01/10/2022   4:48 PM EDT ?To: Kyle Gonzales, Kyle Champagne, PA-C, # ?Subject: RE: Recurrent appendicitis with h/o PSC - ?C# ? ?Maybe try to get colonoscopy Gonzales the day before & appy vs ileocectomy the next day.  That way 1 bowel prep & his Mom can help get it all Gonzales ? ?----- Message ----- ?From: Kyle Flock, MD ?Sent: 01/10/2022   4:40 PM EDT ?To: Kyle Boston, MD, Kyle Gonzales, # ?Subject: RE: Recurrent appendicitis with h/o PSC - ?C# ? ?Thanks Kyle Gonzales. ? ?With his history of [place] he actually warrants a colonoscopy yearly given his risk for IBD. ?He is very overdue, he was supposed to have one with me earlier this month and then had the appendicitis recur and had it cancelled.  ? ?Sounds like he needs appendectomy either way. If you want me to do his colon first in May I can do that to make sure nothing else is going on, or do it after he recovers from his appendectomy, as it is nonurgent. Either way okay with me, just let me know what you prefer. If we coordinate in May should have some flexibility in dates. ? ?Kyle Gonzales ? ? ?----- Message ----- ?From: Kyle Boston, MD ?Sent: 01/09/2022   5:08 PM EDT ?To: Kyle Gonzales, Kyle Champagne, PA-C, # ?Subject: Recurrent appendicitis with h/o PSC - ?Colon# ? ?SA: ? ?I saw this young gentleman with numerous health issues after recent admit.  Sclerosing cholangitis, protein S deficiency, etc.  Followed by y'all and I think he sees hepatology through [facility].  Colonoscopy 2018 by [facility] mostly underwhelming.  Patient's mother thinks that her brother,  his uncle, had colon cancer in his 42s.   ? ?Patient developed severe appendicitis in 2021.  Kyle Gonzales attempted urgent appendectomy but I think only was able to do a partial.  Kyle Gonzales was considering setting up an interval appendectomy but patient was lost to follow-up for the past few years.   ? ?Patient return to the ER with recurrent appendicitis of his stump earlier this month.  Improved with IV antibiotics.  They wanted to see colorectal to consider interval appendectomy.  I think at some point there was discussion (according to the patient's mom) about getting another colonoscopy - ?because of [place]?Marland Kitchen  Do not know if we need to repeat one first.  My plan was to do an interval appendectomy 6-8 weeks from discharge = early May.  Do not know if this could be a coordination or you feel comfortable vs holding off on any colonoscopy given the underwhelming when only 5 years ago. ? ?Let me know which all think. ? ?Your regularly humbled and sometimes obedient colleague, ? ?SG ? ?#################### ? ?ASSESSMENT/PLAN ? 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 ? ?Given the fact he has had recurrent inflammation, I think he would benefit from completion appendectomy.  Given his obesity and prior surgeries and prior admissions and  difficult dissection, I think would be wise to consider a robotic approach for lyse adhesions for appendectomy.  Possible ileocolectomy if severe.   ? ?History of protein S deficiency.  Should be okay to hold his Eliquis 2 days preop and then resume postop day 1. ? ?The anatomy & physiology of the digestive tract was discussed.  The pathophysiology of appendicitis and other appendiceal disorders were discussed.  Natural history risks without surgery was discussed.   I feel the risks  of no intervention will lead to serious problems that outweigh the operative risks; therefore, I recommended diagnostic laparoscopy with removal of appendix to remove the pathology.  Laparoscopic & open techniques were discussed.   Possible need for ileocolonic resection.  I noted a good likelihood this will help address the problem.   ? ?Risks such as bleeding, infection, abscess, leak, reoperation, injury to other organs, need for repair of tissues / organs, possible ostomy, hernia, heart attack, stroke, death, and other risks were discussed.  Goals of post-operative recovery were discussed as well.  We will work to minimize complications.  Questions were answered.  The patient expresses understanding & wishes to proceed with surgery. ? ? ?I think the plan was for him to get a colonoscopy since he has a uncle who had colon cancer in his 27s.  That was already being set up with [facility] but I think that fell through.  He did have a colonoscopy 5 years ago to rule out ulcerative colitis that seemed mostly underwhelming.  See if Kyle Gonzales is willing to consider doing colonoscopy the day before surgery or if suspicion is low enough to avoid a repeat now..  That way the patient gets 1 bowel prep and I can do surgery knowing what needs to happen. ? ? ? ? ?

## 2022-01-11 NOTE — Telephone Encounter (Signed)
Received a reutnr call from [facility]. She stated that they do not have the surgery order yet. Either Caryl Pina or Barth Kirks will give me a call back to schedule patient once they receive the order from the provider. Will await return call.  ?

## 2022-01-11 NOTE — Telephone Encounter (Signed)
Lm on vm for [facility] (surgery scheduler at [facility]) to give me a call back to discuss potential dates. ?

## 2022-01-17 NOTE — Telephone Encounter (Signed)
I will do so  - please manage the pre-procedure anticoagulation.and other instructions. ? ?- HD ?

## 2022-01-17 NOTE — Telephone Encounter (Signed)
Received a call from [facility] at [facility]. She reports that pt is scheduled for surgery with Dr. Johney Maine on 02/15/22. You will not be in the [facility] on 02/14/22. Dr. Loletha Carrow has availability 02/14/22 at 3:30 pm. Please advise on how you would like to proceed. Thanks ?

## 2022-01-17 NOTE — Telephone Encounter (Signed)
Thanks Robert Lee, ? ?Mallie Mussel, any chance you would be able to accommodate this patient for colonoscopy on the date as outlined?  I am unfortunately not in the [facility] that day and we are trying to coordinate his colonoscopy with his operation.  Thanks for your thoughts. ? ?Richardson Landry ?

## 2022-01-18 NOTE — Telephone Encounter (Signed)
Thank you! ?[place] patient will need approval to hold Eliquis for 2 years if you can get clearance. Thanks ?

## 2022-01-18 NOTE — Telephone Encounter (Signed)
Called and spoke with patient's mother to discuss appt. She is aware that pt has been scheduled for a colonoscopy with Dr. Loletha Carrow on Tuesday, 02/14/22 at 4 pm. Pt's mother is aware that pt will need to arrive at 3 pm with a care partner. She is aware that pt's last dose of Eliquis will be on 4/29, pt will hold on 4/30 and 5/1. Pt's mother is aware that the pt will be informed on when to resume Eliquis. Pt already has his SUTAB prep. Pt's mother is aware that I will send updated instructions via MyChart and I will mail a copy also. Pt's mother verbalized understanding and had no concerns at the end of the call. ? ?See previous cardiac clearance (telephone encounter 11/03/21). ? ?Called and left Caryl Pina at [facility] a detailed vm letting her know of patient's appt. I told Caryl Pina to call back with any questions.  ?

## 2022-01-18 NOTE — Addendum Note (Signed)
Addended by: Yevette Edwards on: 01/18/2022 09:40 AM ? ? Modules accepted: Orders ? ?

## 2022-01-31 ENCOUNTER — Other Ambulatory Visit: Payer: Self-pay | Admitting: Gastroenterology

## 2022-01-31 DIAGNOSIS — K219 Gastro-esophageal reflux disease without esophagitis: Secondary | ICD-10-CM

## 2022-02-01 ENCOUNTER — Other Ambulatory Visit (HOSPITAL_COMMUNITY): Payer: Self-pay

## 2022-02-02 NOTE — Patient Instructions (Addendum)
2 VISITORS ARE ALLOWED TO COME WITH YOU AND STAY IN THE WAITING ROOM ONLY DURING PRE OP AND PROCEDURE.   ? ?**NO VISITORS ARE ALLOWED IN THE SHORT STAY AREA OR RECOVERY ROOM!!** ? ?IF YOU WILL BE ADMITTED INTO THE HOSPITAL YOU ARE ALLOWED  4  SUPPORT PEOPLE DURING VISITATION HOURS ONLY (7 AM -8PM)   ?The support person(s) must pass our screening, gel in and out, and wear a mask at all times, including in the patient?s room. ?Patients must also wear a mask when staff or their support person are in the room. ?Visitors GUEST BADGE MUST BE WORN VISIBLY  ?One adult visitor may remain with you overnight and MUST be in the room by 8 P.M. ?  ? ? Your procedure is scheduled on: 02/15/22 ? ? Report to [facility] Main Entrance ? ?  Report to admitting at  9:45 AM ? ? Call this number if you have problems the morning of surgery [phone] ? ? Do not eat food :After Midnight. ? ? After Midnight you may have the following liquids until _9:00_ AM/  DAY OF SURGERY ? ?Water ?Black Coffee (sugar ok, NO MILK/CREAM OR CREAMERS)  ?Tea (sugar ok, NO MILK/CREAM OR CREAMERS) regular and decaf                             ?Plain Jell-O (NO RED)                                           ?Fruit ices (not with fruit pulp, NO RED)                                     ?Popsicles (NO RED)                                                                  ?Juice: apple, WHITE grape, WHITE cranberry ?Sports drinks like Gatorade (NO RED) ?Clear broth(vegetable,chicken,beef) ? ?             ?Drink 2 Ensure/G2 drinks AT 10:00 PM the night before surgery.   ? ?  ?  ?The day of surgery:  ?Drink ONE (1) Pre-Surgery Clear Ensure at  8:45 AM the morning of surgery. Drink in one sitting. Do not sip.  ?This drink was given to you during your hospital  ?pre-op appointment visit. ?Nothing else to drink after completing the  ?Pre-Surgery Clear Ensure at 9:00 AM ?  ?       If you have questions, please contact your surgeon?s office. ? ? ?FOLLOW BOWEL PREP  AND ANY ADDITIONAL PRE OP INSTRUCTIONS YOU RECEIVED FROM YOUR SURGEON'S OFFICE!!! ? ? Drink plenty of fluids on the day of the prep to prevent dehydration ?  ?Oral Hygiene is also important to reduce your risk of infection.                                    ?Remember -  BRUSH YOUR TEETH THE MORNING OF SURGERY WITH YOUR REGULAR TOOTHPASTE ? ? Do NOT smoke after Midnight ? ? Take these medicines the morning of surgery with A SIP OF WATER: Zoloft, Abilify, Levothyroxine, Omeprazole ? ?Stop taking your Eliquis 2 days prior to DOS last dose 02/12/22 ?                  ?           You may not have any metal on your body including  jewelry, and body piercing ? ?           Do not wear lotions, powders, cologne, or deodorant ? ?            Men may shave face and neck. ? ? Do not bring valuables to the hospital. [facility] NOT ?            RESPONSIBLE   FOR VALUABLES. ? ? Contacts, dentures or bridgework may not be worn into surgery. ? ? Bring small overnight bag day of surgery. ? . ? ? Special Instructions: Bring a copy of your healthcare power of attorney and living will documents the day of surgery if you haven't scanned them before. ? ?            Please read over the following fact sheets you were given: IF [facility] [phone] ? ? ?   [facility] - Preparing for Surgery ?Before surgery, you can play an important role.  Because skin is not sterile, your skin needs to be as free of germs as possible.  You can reduce the number of germs on your skin by washing with CHG (chlorahexidine gluconate) soap before surgery.  CHG is an antiseptic cleaner which kills germs and bonds with the skin to continue killing germs even after washing. ?Please DO NOT use if you have an allergy to CHG or antibacterial soaps.  If your skin becomes reddened/irritated stop using the CHG and inform your nurse when you arrive at Short Stay..  You may shave your face/neck. ?Please follow these  instructions carefully: ? 1.  Shower with CHG Soap the night before surgery and the  morning of Surgery. ? 2.  If you choose to wash your hair, wash your hair first as usual with your  normal  shampoo. ? 3.  After you shampoo, rinse your hair and body thoroughly to remove the  shampoo.                    ?        4.  Use CHG as you would any other liquid soap.  You can apply chg directly  to the skin and wash  ?                     Gently with a scrungie or clean washcloth. ? 5.  Apply the CHG Soap to your body ONLY FROM THE NECK DOWN.   Do not use on face/ open      ?                     Wound or open sores. Avoid contact with eyes, ears mouth and genitals (private parts).  ?                     Production manager,  Genitals (private parts) with your normal soap. ?  6.  Wash thoroughly, paying special attention to the area where your surgery  will be performed. ? 7.  Thoroughly rinse your body with warm water from the neck down. ? 8.  DO NOT shower/wash with your normal soap after using and rinsing off  the CHG Soap. ?               9.  Pat yourself dry with a clean towel. ?           10.  Wear clean pajamas. ?           11.  Place clean sheets on your bed the night of your first shower and do not  sleep with pets. ?Day of Surgery : ?Do not apply any lotions/deodorants the morning of surgery.  Please wear clean clothes to the hospital/surgery center. ? ?FAILURE TO FOLLOW THESE INSTRUCTIONS MAY RESULT IN THE CANCELLATION OF YOUR SURGERY ?_ ? ? ?________________________________________________________________________  ? ?Incentive Spirometer ? ?An incentive spirometer is a tool that can help keep your lungs clear and active. This tool measures how well you are filling your lungs with each breath. Taking long deep breaths may help reverse or decrease the chance of developing breathing (pulmonary) problems (especially infection) following: ?A long period of time when you are unable to move or be active. ?BEFORE THE  PROCEDURE  ?If the spirometer includes an indicator to show your best effort, your nurse or respiratory therapist will set it to a desired goal. ?If possible, sit up straight or lean slightly forward. Try not to slouch. ?Hold the incentive spirometer in an upright position. ?INSTRUCTIONS FOR USE  ?Sit on the edge of your bed if possible, or sit up as far as you can in bed or on a chair. ?Hold the incentive spirometer in an upright position. ?Breathe out normally. ?Place the mouthpiece in your mouth and seal your lips tightly around it. ?Breathe in slowly and as deeply as possible, raising the piston or the ball toward the top of the column. ?Hold your breath for 3-5 seconds or for as long as possible. Allow the piston or ball to fall to the bottom of the column. ?Remove the mouthpiece from your mouth and breathe out normally. ?Rest for a few seconds and repeat Steps 1 through 7 at least 10 times every 1-2 hours when you are awake. Take your time and take a few normal breaths between deep breaths. ?The spirometer may include an indicator to show your best effort. Use the indicator as a goal to work toward during each repetition. ?After each set of 10 deep breaths, practice coughing to be sure your lungs are clear. If you have an incision (the cut made at the time of surgery), support your incision when coughing by placing a pillow or rolled up towels firmly against it. ?Once you are able to get out of bed, walk around indoors and cough well. You may stop using the incentive spirometer when instructed by your caregiver.  ?RISKS AND COMPLICATIONS ?Take your time so you do not get dizzy or light-headed. ?If you are in pain, you may need to take or ask for pain medication before doing incentive spirometry. It is harder to take a deep breath if you are having pain. ?AFTER USE ?Rest and breathe slowly and easily. ?It can be helpful to keep track of a log of your progress. Your caregiver can provide you with a simple table  to help with this. ?If you are using the spirometer  at home, follow these instructions: ?SEEK MEDICAL CARE IF:  ?You are having difficultly using the spirometer. ?You have trouble using the spirometer a

## 2022-02-03 ENCOUNTER — Other Ambulatory Visit: Payer: Self-pay

## 2022-02-03 ENCOUNTER — Other Ambulatory Visit: Payer: Self-pay | Admitting: Nurse Practitioner

## 2022-02-03 ENCOUNTER — Encounter (HOSPITAL_COMMUNITY)
Admission: RE | Admit: 2022-02-03 | Discharge: 2022-02-03 | Disposition: A | Discharge status: Home / Self Care | Payer: Medicare Other | Source: Ambulatory Visit | Attending: Surgery | Admitting: Surgery

## 2022-02-03 ENCOUNTER — Other Ambulatory Visit: Payer: Self-pay | Admitting: Internal Medicine

## 2022-02-03 ENCOUNTER — Encounter (HOSPITAL_COMMUNITY): Payer: Self-pay

## 2022-02-03 VITALS — BP 148/72 | HR 78 | Temp 98.0°F | Resp 18 | Ht 75.0 in | Wt 290.0 lb

## 2022-02-03 DIAGNOSIS — I10 Essential (primary) hypertension: Secondary | ICD-10-CM | POA: Diagnosis not present

## 2022-02-03 DIAGNOSIS — Z01818 Encounter for other preprocedural examination: Secondary | ICD-10-CM | POA: Insufficient documentation

## 2022-02-03 LAB — CBC
HCT: 41.7 % (ref 39.0–52.0)
Hemoglobin: 13.4 g/dL (ref 13.0–17.0)
MCH: 30.5 pg (ref 26.0–34.0)
MCHC: 32.1 g/dL (ref 30.0–36.0)
MCV: 95 fL (ref 80.0–100.0)
Platelets: 164 10*3/uL (ref 150–400)
RBC: 4.39 MIL/uL (ref 4.22–5.81)
RDW: 15.5 % (ref 11.5–15.5)
WBC: 5.3 10*3/uL (ref 4.0–10.5)
nRBC: 0 % (ref 0.0–0.2)

## 2022-02-03 LAB — BASIC METABOLIC PANEL
Anion gap: 7 (ref 5–15)
BUN: 12 mg/dL (ref 6–20)
CO2: 25 mmol/L (ref 22–32)
Calcium: 9.2 mg/dL (ref 8.9–10.3)
Chloride: 104 mmol/L (ref 98–111)
Creatinine, Ser: 1.26 mg/dL — ABNORMAL HIGH (ref 0.61–1.24)
GFR, Estimated: >60 mL/min (ref >60)
Glucose, Bld: 98 mg/dL (ref 70–99)
Potassium: 5.4 mmol/L — ABNORMAL HIGH (ref 3.5–5.1)
Sodium: 136 mmol/L (ref 135–145)

## 2022-02-03 MED ORDER — BISOPROLOL-HYDROCHLOROTHIAZIDE 10-6.25 MG PO TABS: ORAL_TABLET | ORAL | 0 refills | Status: AC

## 2022-02-03 NOTE — Consult Note (Signed)
[facility] Nurse requested for preoperative stoma site marking ? ?Discussed surgical procedure and stoma creation with patient and family.  Explained role of the [facility] nurse team.  Provided the patient with educational booklet and provided samples of pouching options.  Answered patient and family questions.  ? ?Examined patient sitting andstanding in order to place the marking in the patient's visual field, away from any creases or abdominal contour issues and within the rectus muscle.   ?Marked for colostomy in the LLQ  __5__ cm to the left of the umbilicus and __3__TD below the umbilicus. ? ?Marked for ileostomy in the RLQ  __4__cm to the right of the umbilicus and  _9.7___ cm below the umbilicus. ? ?Patient's abdomen cleansed with CHG wipes at site markings, allowed to air dry prior to marking.Covered mark with thin film transparent dressing to preserve mark until date of surgery. Provided 2 extra thin film dressings to maintain ostomy site markings until surgery. ? ?[facility] Nurse team will follow up with patient after surgery for continue ostomy care and teaching.  ?Brienne Liguori Liane Comber MSN, RN,CWOCN, CNS, CWON-AP ?[phone]  ?

## 2022-02-03 NOTE — Progress Notes (Signed)
Anesthesia note: ? ?Bowel prep reminder:yes ? ?PCP - Dr. Magnus Sinning ?Cardiologist - ?Other-  ? ?Chest x-ray - no ?EKG - 02/03/22-chart ?Stress Test - no ?ECHO - no ?Cardiac Cath - NA ? ?Pacemaker/ICD device last checked:NA ? ?Sleep Study - no ?CPAP -  ? ?Pt is pre diabetic-NA ?Fasting Blood Sugar -  ?Checks Blood Sugar _____ ? ?Blood Thinner:Eliquis/ Dr. Catha Brow for Protein S deficiency. ?Blood Thinner Instructions:Stop 2 days prior to DOS  ?Aspirin Instructions: ?Last Dose:4/30 23 ? ?Anesthesia review: yes ? ?Patient denies shortness of breath, fever, cough and chest pain at PAT appointment ? ?Pt has no SOB with activities. ?Patient verbalized understanding of instructions that were given to them at the PAT appointment. Patient was also instructed that they will need to review over the PAT instructions again at home before surgery. Yes. His Mother was with him at the PAT visit ?

## 2022-02-07 ENCOUNTER — Encounter: Payer: Self-pay | Admitting: Gastroenterology

## 2022-02-14 ENCOUNTER — Encounter: Payer: Self-pay | Admitting: Gastroenterology

## 2022-02-14 ENCOUNTER — Encounter: Payer: Managed Care, Other (non HMO) | Admitting: Gastroenterology

## 2022-02-14 ENCOUNTER — Ambulatory Visit (AMBULATORY_SURGERY_CENTER): Payer: Medicare Other | Admitting: Gastroenterology

## 2022-02-14 VITALS — BP 138/89 | HR 58 | Temp 98.6°F | Resp 12 | Ht 75.0 in | Wt 273.0 lb

## 2022-02-14 DIAGNOSIS — K8301 Primary sclerosing cholangitis: Secondary | ICD-10-CM | POA: Diagnosis not present

## 2022-02-14 MED ORDER — SODIUM CHLORIDE 0.9 % IV SOLN: 500.0000 mL | Freq: Once | INTRAVENOUS | Status: DC

## 2022-02-14 NOTE — Progress Notes (Signed)
Called to room to assist during endoscopic procedure.  Patient ID and intended procedure confirmed with present staff. Received instructions for my participation in the procedure from the performing physician.  

## 2022-02-14 NOTE — Patient Instructions (Signed)
Please read handouts provided. ?Continue present medications. ?Await pathology results. ?Remain off Eliquis for surgery tomorrow. ?Clear liquid diet today, then nothing to eat or drink after midnight. ? ? ?YOU HAD AN ENDOSCOPIC PROCEDURE TODAY AT [facility]:   Refer to the procedure report that was given to you for any specific questions about what was found during the examination.  If the procedure report does not answer your questions, please call your gastroenterologist to clarify.  If you requested that your care partner not be given the details of your procedure findings, then the procedure report has been included in a sealed envelope for you to review at your convenience later. ? ?YOU SHOULD EXPECT: Some feelings of bloating in the abdomen. Passage of more gas than usual.  Walking can help get rid of the air that was put into your GI tract during the procedure and reduce the bloating. If you had a lower endoscopy (such as a colonoscopy or flexible sigmoidoscopy) you may notice spotting of blood in your stool or on the toilet paper. If you underwent a bowel prep for your procedure, you may not have a normal bowel movement for a few days. ? ?Please Note:  You might notice some irritation and congestion in your nose or some drainage.  This is from the oxygen used during your procedure.  There is no need for concern and it should clear up in a day or so. ? ?SYMPTOMS TO REPORT IMMEDIATELY: ? ?Following lower endoscopy (colonoscopy or flexible sigmoidoscopy): ? Excessive amounts of blood in the stool ? Significant tenderness or worsening of abdominal pains ? Swelling of the abdomen that is new, acute ? Fever of 100?F or higher ? ? ?For urgent or emergent issues, a gastroenterologist can be reached at any hour by calling [phone]. ?Do not use MyChart messaging for urgent concerns.  ? ? ?DIET:  We do recommend a small meal at first, but then you may proceed to your regular diet.  Drink plenty  of fluids but you should avoid alcoholic beverages for 24 hours. ? ?ACTIVITY:  You should plan to take it easy for the rest of today and you should NOT DRIVE or use heavy machinery until tomorrow (because of the sedation medicines used during the test).   ? ?FOLLOW UP: ?Our staff will call the number listed on your records 48-72 hours following your procedure to check on you and address any questions or concerns that you may have regarding the information given to you following your procedure. If we do not reach you, we will leave a message.  We will attempt to reach you two times.  During this call, we will ask if you have developed any symptoms of COVID 19. If you develop any symptoms (ie: fever, flu-like symptoms, shortness of breath, cough etc.) before then, please call [phone].  If you test positive for Covid 19 in the 2 weeks post procedure, please call and report this information to [place].   ? ?If any biopsies were taken you will be contacted by phone or by letter within the next 1-3 weeks.  Please call us at [phone] if you have not heard about the biopsies in 3 weeks.  ? ? ?SIGNATURES/CONFIDENTIALITY: ?You and/or your care partner have signed paperwork which will be entered into your electronic medical record.  These signatures attest to the fact that that the information above on your After Visit Summary has been reviewed and is understood.  Full responsibility of the  confidentiality of this discharge information lies with you and/or your care-partner.  ?

## 2022-02-14 NOTE — Progress Notes (Signed)
Vitals-CW ? ?History reviewed in detail.  Patient is a poor historian.  Slow to answer. ?

## 2022-02-14 NOTE — Anesthesia Preprocedure Evaluation (Addendum)
Anesthesia Evaluation  ?Patient identified by MRN, date of birth, ID band ?Patient awake ? ? ? ?Reviewed: ?Allergy & Precautions, NPO status , Patient's Chart, lab work & pertinent test results, reviewed documented beta blocker date and time  ? ?Airway ?Mallampati: II ? ?TM Distance: >3 FB ?Neck ROM: Full ? ? ? Dental ?no notable dental hx. ?(+) Teeth Intact ?  ?Pulmonary ?Current Smoker and Patient abstained from smoking.,  ?  ?Pulmonary exam normal ?breath sounds clear to auscultation ? ? ? ? ? ? Cardiovascular ?hypertension, Pt. on medications ?Normal cardiovascular exam ?Rhythm:Regular Rate:Normal ? ?Hx/o superior Mesenteric vein thrombosis 2019 ?  ?Neuro/Psych ?PSYCHIATRIC DISORDERS Anxiety Depression negative neurological ROS ?   ? GI/Hepatic ?GERD  Medicated and Controlled,(+)  ?  ? substance abuse ? marijuana use, Hx/o primary sclerosing cholangitis ?Appendicitis- acute ?  ?Endo/Other  ?Hypothyroidism Hyperthyroidism Hx/o Grave's disease ?Hyperlipidemia ? Renal/GU ?  ?negative genitourinary ?  ?Musculoskeletal ?negative musculoskeletal ROS ?(+)  ? Abdominal ?(+) + obese,   ?Peds ? Hematology ? ?(+) Blood dyscrasia, , Protein S deficiency ?Eliquis therapy- last dose   ?Anesthesia Other Findings ? ? Reproductive/Obstetrics ? ?  ? ? ? ? ? ? ? ? ? ? ? ? ? ?  ?  ? ? ? ? ? ? ?Anesthesia Physical ? ?Anesthesia Plan ? ?ASA: 3 ? ?Anesthesia Plan: General  ? ?Post-op Pain Management:   ? ?Induction: Intravenous ? ?PONV Risk Score and Plan: 3 and Ondansetron, Dexamethasone, Treatment may vary due to age or medical condition and Midazolam ? ?Airway Management Planned: Oral ETT ? ?Additional Equipment: None ? ?Intra-op Plan:  ? ?Post-operative Plan: Extubation in OR ? ?Informed Consent: I have reviewed the patients History and Physical, chart, labs and discussed the procedure including the risks, benefits and alternatives for the proposed anesthesia with the patient or authorized  representative who has indicated his/her understanding and acceptance.  ? ? ? ?Dental advisory given ? ?Plan Discussed with: CRNA, Anesthesiologist and Surgeon ? ?Anesthesia Plan Comments:   ? ? ? ? ? ? ?Anesthesia Quick Evaluation ? ?

## 2022-02-14 NOTE — Op Note (Signed)
[name] ?Procedure Date: 02/14/2022 3:00 PM ?MRN: [id number] ?Endoscopist: Estill Cotta. Loletha Carrow , MD ?Age: 31 ?Referring MD:  ?Date of Birth: 21-Jul-1991 ?Gender: Male ?Account #: [id number] ?Procedure:                Colonoscopy ?Indications:              [place] screening: screening for Inflammatory bowel  ?                          disease with primary sclerosing cholangitis ?                          patient has also had recurrent appendicitis after  ?                          prior partial appendectomy and is scheduled for  ?                          further appendectomy tomorrow ?Medicines:                Monitored Anesthesia Care ?Procedure:                Pre-Anesthesia Assessment: ?                          - Prior to the procedure, a History and Physical  ?                          was performed, and patient medications and  ?                          allergies were reviewed. The patient's tolerance of  ?                          previous anesthesia was also reviewed. The risks  ?                          and benefits of the procedure and the sedation  ?                          options and risks were discussed with the patient.  ?                          All questions were answered, and informed consent  ?                          was obtained. Prior Anticoagulants: The patient has  ?                          taken Eliquis (apixaban), last dose was 2 days  ?                          prior to procedure. ASA Grade Assessment: II - A  ?  patient with mild systemic disease. After reviewing  ?                          the risks and benefits, the patient was deemed in  ?                          satisfactory condition to undergo the procedure. ?                          After obtaining informed consent, the colonoscope  ?                          was passed under direct vision. Throughout the  ?                          procedure, the patient's blood  pressure, pulse, and  ?                          oxygen saturations were monitored continuously. The  ?                          CF [id number] #[id number] was introduced through the anus  ?                          and advanced to the the terminal ileum, with  ?                          identification of the appendiceal orifice and IC  ?                          valve. The colonoscopy was performed without  ?                          difficulty. The patient tolerated the procedure  ?                          well. The quality of the bowel preparation was  ?                          excellent. The terminal ileum, ileocecal valve,  ?                          appendiceal orifice, and rectum were photographed. ?Scope In: 3:38:52 PM ?Scope Out: 3:51:05 PM ?Scope Withdrawal Time: 0 hours 10 minutes 22 seconds  ?Total Procedure Duration: 0 hours 12 minutes 13 seconds  ?Findings:                 The perianal and digital rectal examinations were  ?                          normal. ?                          The terminal ileum appeared normal. ?  Normal mucosa was found in the entire colon.  ?                          Several biopsies were obtained in the sigmoid  ?                          colon, in the transverse colon and in the ascending  ?                          colon with cold forceps for histology. ?                          Repeat examination of right colon under NBI  ?                          performed. ?                          The entire examined colon appeared normal on direct  ?                          and retroflexion views. ?Complications:            No immediate complications. ?Estimated Blood Loss:     Estimated blood loss: none. ?Impression:               - The examined portion of the ileum was normal. ?                          - Normal mucosa in the entire examined colon. ?                          - The entire examined colon is normal on direct and  ?                           retroflexion views. ?                          - Several biopsies were obtained in the sigmoid  ?                          colon, in the transverse colon and in the ascending  ?                          colon. ?Recommendation:           - Patient has a contact number available for  ?                          emergencies. The signs and symptoms of potential  ?                          delayed complications were discussed with the  ?                          patient. Return to  normal activities tomorrow.  ?                          Written discharge instructions were provided to the  ?                          patient. ?                          - Clear liquid diet today, then NPO after midnight. ?                          - remain off Eliquis for surgery tomorrow ?                          - Await pathology results. ?                          - Repeat colonoscopy is recommended for IBD  ?                          screening. The colonoscopy date will be determined  ?                          by Dr. Havery Moros after pathology results from  ?                          today's exam become available for review. ?Kyle Gonzales L. Loletha Carrow, MD ?02/14/2022 4:00:36 PM ?This report has been signed electronically. ?

## 2022-02-14 NOTE — Progress Notes (Signed)
History and Physical: ? This patient presents for endoscopic testing for: ?Encounter Diagnosis  ?Name Primary?  ? Kyle Gonzales (primary sclerosing cholangitis) Yes  ? ? ?Clinical details of this patient's condition are in Dr. Doyne Keel office note of 11/03/2021 which was completely reviewed today.  This patient has small duct PSC and is undergoing colonoscopy today for routine screening of inflammatory bowel disease in keeping with current guidelines.  He has also had recurrent appendicitis and is scheduled to have an appendectomy tomorrow. 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 ? ?He reports some recent loose stool and denies rectal bleeding. ? ?Patient is otherwise without complaints or active issues today. ? ? ?Past Medical History: ?Past Medical History:  ?Diagnosis Date  ? GERD (gastroesophageal reflux disease)   ? Graves disease 08/18/2015  ? Hyperlipidemia   ? Hypothyroid   ? Palpitations   ? PSC (primary sclerosing cholangitis)   ? 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 Vitamin D deficiency   ? ? ? ?Past Surgical History: ?Past Surgical History:  ?Procedure Laterality Date  ? ANTERIOR CRUCIATE LIGAMENT REPAIR  2012  ? CHOLECYSTECTOMY  2022  ? ERCP N/A 08/19/2018  ? Procedure: ENDOSCOPIC RETROGRADE CHOLANGIOPANCREATOGRAPHY (ERCP);  Surgeon: Milus Banister, MD;  Location: [facility];  Service: Endoscopy;  Laterality: N/A;  ? LAPAROSCOPIC APPENDECTOMY N/A 05/11/2020  ? Procedure: APPENDECTOMY LAPAROSCOPIC;  Surgeon: Johnathan Hausen, MD;  Location: [facility];  Service: General;  Laterality: N/A;  ? REMOVAL OF STONES  08/19/2018  ? Procedure: REMOVAL OF STONES;  Surgeon: Milus Banister, MD;  Location: [facility];  Service: Endoscopy;;  ? SPHINCTEROTOMY  08/19/2018  ? Procedure: SPHINCTEROTOMY;  Surgeon: Milus Banister, MD;  Location: [facility];  Service: Endoscopy;;  ? WISDOM TOOTH EXTRACTION     ? age 54  ? ? ?Allergies: ?Allergies  ?Allergen Reactions  ? Tylenol [Acetaminophen] Other (See Comments)  ?  r/t elevated LFTs ?  ? ? ?Outpatient Meds: ?Current Outpatient Medications  ?Medication Sig Dispense Refill  ? apixaban (ELIQUIS) 5 MG TABS tablet TAKE 1 TABLET(5 MG) BY MOUTH TWICE DAILY 180 tablet 0  ? ARIPiprazole (ABILIFY) 10 MG tablet Take 1 tablet (10 mg total) by mouth daily. 30 tablet 3  ? bisoprolol-hydrochlorothiazide (ZIAC) 10-6.25 MG tablet Take 1 tablet Daily for BP - Last Refill . Chart closed . 30 tablet 0  ? Cholecalciferol (VITAMIN D3) 5000 units CAPS Take 1 capsule (5,000 Units total) by mouth every other day. (Patient taking differently: Take 5,000 Units by mouth daily.)    ? ezetimibe (ZETIA) 10 MG tablet Take  1 tablet  Daily  for Cholesterol 90 tablet 1  ? hydrOXYzine (ATARAX) 50 MG tablet TAKE ONE TABLET BY MOUTH THREE TIMES A DAY AS NEEDED FOR ANXIETY 90 tablet 1  ? levothyroxine (SYNTHROID) 100 MCG tablet Take 1 tablet   Daily   on an empty stomach with only water for 30 minutes & no Antacid meds, Calcium or Magnesium for 4 hours & avoid Biotin 90 tablet 1  ? Multiple Vitamins-Minerals (MENS MULTIVITAMIN) TABS Take 1 tablet by mouth daily.    ? omeprazole (PRILOSEC) 40 MG capsule Take 1 capsule (40 mg total) by mouth daily. 90 capsule 1  ? oxyCODONE (OXY IR/ROXICODONE) 5 MG immediate release tablet Take 1 tablet (5 mg total) by mouth every 6 (six) hours as needed for moderate pain. 10  tablet 0  ? rosuvastatin (CRESTOR) 10 MG tablet Take 1 tablet Daily for Cholesterol 90 tablet 1  ? sertraline (ZOLOFT) 100 MG tablet Take 1.5 tablets (150 mg total) by mouth daily. 45 tablet 3  ? tadalafil (CIALIS) 20 MG tablet Take  1/2 to 1 tablet  every 2 to 3 days  as needed for XXXX 30 tablet 0  ? traZODone (DESYREL) 100 MG tablet Take 100 mg by mouth at bedtime as needed for sleep.    ? ?No current facility-administered medications for this visit.   ? ? ? ? ?___________________________________________________________________ ?Objective  ? ?Exam: ? ?There were no vitals taken for this visit. ? ?CV: RRR without murmur, S1/S2 ?Resp: clear to auscultation bilaterally, normal RR and effort noted ?GI: soft, no tenderness, with active bowel sounds. ? ? ?Assessment: ?Encounter Diagnosis  ?Name Primary?  ? [name] (primary sclerosing cholangitis) Yes  ? ? ? ?Plan: ?Colonoscopy with biopsies. ? The benefits and risks of the planned procedure were described in detail with the patient or (when appropriate) their health care proxy.  Risks were outlined as including, but not limited to, bleeding, infection, perforation, adverse medication reaction leading to cardiac or pulmonary decompensation, pancreatitis (if ERCP).  The limitation of incomplete mucosal visualization was also discussed.  No guarantees or warranties were given. ? ? ? ?The patient is appropriate for an endoscopic procedure in the ambulatory setting. ? ? - Wilfrid Lund, MD ? ? ? ? ?

## 2022-02-14 NOTE — Progress Notes (Signed)
PT taken to PACU. Monitors in place. VSS. Report given to RN. 

## 2022-02-15 ENCOUNTER — Observation Stay (HOSPITAL_COMMUNITY)
Admission: RE | Admit: 2022-02-15 | Discharge: 2022-02-16 | Disposition: A | Discharge status: Home / Self Care | Payer: Medicare Other | Source: Ambulatory Visit | Attending: Surgery | Admitting: Surgery

## 2022-02-15 ENCOUNTER — Other Ambulatory Visit: Payer: Self-pay

## 2022-02-15 ENCOUNTER — Inpatient Hospital Stay (HOSPITAL_COMMUNITY): Payer: Medicare Other | Admitting: Physician Assistant

## 2022-02-15 ENCOUNTER — Encounter (HOSPITAL_COMMUNITY)
Admission: RE | Disposition: A | Discharge status: Home / Self Care | Payer: Self-pay | Source: Ambulatory Visit | Attending: Surgery

## 2022-02-15 ENCOUNTER — Inpatient Hospital Stay (HOSPITAL_BASED_OUTPATIENT_CLINIC_OR_DEPARTMENT_OTHER): Payer: Medicare Other | Admitting: Anesthesiology

## 2022-02-15 ENCOUNTER — Encounter (HOSPITAL_COMMUNITY): Payer: Self-pay | Admitting: Surgery

## 2022-02-15 DIAGNOSIS — K37 Unspecified appendicitis: Secondary | ICD-10-CM | POA: Diagnosis present

## 2022-02-15 DIAGNOSIS — K219 Gastro-esophageal reflux disease without esophagitis: Secondary | ICD-10-CM | POA: Diagnosis not present

## 2022-02-15 DIAGNOSIS — I10 Essential (primary) hypertension: Secondary | ICD-10-CM | POA: Diagnosis present

## 2022-02-15 DIAGNOSIS — Z86718 Personal history of other venous thrombosis and embolism: Secondary | ICD-10-CM

## 2022-02-15 DIAGNOSIS — K529 Noninfective gastroenteritis and colitis, unspecified: Secondary | ICD-10-CM | POA: Diagnosis not present

## 2022-02-15 DIAGNOSIS — E039 Hypothyroidism, unspecified: Secondary | ICD-10-CM | POA: Diagnosis not present

## 2022-02-15 DIAGNOSIS — K8301 Primary sclerosing cholangitis: Secondary | ICD-10-CM | POA: Diagnosis present

## 2022-02-15 DIAGNOSIS — K660 Peritoneal adhesions (postprocedural) (postinfection): Secondary | ICD-10-CM

## 2022-02-15 DIAGNOSIS — K8309 Other cholangitis: Secondary | ICD-10-CM

## 2022-02-15 DIAGNOSIS — F411 Generalized anxiety disorder: Secondary | ICD-10-CM | POA: Diagnosis present

## 2022-02-15 DIAGNOSIS — Z7901 Long term (current) use of anticoagulants: Secondary | ICD-10-CM

## 2022-02-15 DIAGNOSIS — F17210 Nicotine dependence, cigarettes, uncomplicated: Secondary | ICD-10-CM | POA: Insufficient documentation

## 2022-02-15 DIAGNOSIS — D6859 Other primary thrombophilia: Secondary | ICD-10-CM | POA: Diagnosis not present

## 2022-02-15 DIAGNOSIS — K36 Other appendicitis: Secondary | ICD-10-CM

## 2022-02-15 DIAGNOSIS — Z79899 Other long term (current) drug therapy: Secondary | ICD-10-CM | POA: Diagnosis not present

## 2022-02-15 HISTORY — PX: XI ROBOTIC LAPAROSCOPIC ASSISTED APPENDECTOMY: SHX6877

## 2022-02-15 HISTORY — PX: LYSIS OF ADHESION: SHX5961

## 2022-02-15 LAB — CBC
HCT: 42.2 % (ref 39.0–52.0)
Hemoglobin: 13.6 g/dL (ref 13.0–17.0)
MCH: 30.8 pg (ref 26.0–34.0)
MCHC: 32.2 g/dL (ref 30.0–36.0)
MCV: 95.5 fL (ref 80.0–100.0)
Platelets: 188 10*3/uL (ref 150–400)
RBC: 4.42 MIL/uL (ref 4.22–5.81)
RDW: 15.1 % (ref 11.5–15.5)
WBC: 9.6 10*3/uL (ref 4.0–10.5)
nRBC: 0 % (ref 0.0–0.2)

## 2022-02-15 SURGERY — APPENDECTOMY, ROBOT-ASSISTED, LAPAROSCOPIC
Anesthesia: General | Site: Abdomen

## 2022-02-15 MED ORDER — MAGIC MOUTHWASH
15.0000 mL | Freq: Four times a day (QID) | ORAL | Status: DC | PRN
  Filled 2022-02-15: qty 15

## 2022-02-15 MED ORDER — GABAPENTIN 100 MG PO CAPS
200.0000 mg | ORAL_CAPSULE | Freq: Three times a day (TID) | ORAL | Status: DC
  Administered 2022-02-15 – 2022-02-16 (×3): 200 mg via ORAL
  Filled 2022-02-15 (×3): qty 2

## 2022-02-15 MED ORDER — ORAL CARE MOUTH RINSE: 15.0000 mL | Freq: Once | OROMUCOSAL | Status: AC

## 2022-02-15 MED ORDER — CHLORHEXIDINE GLUCONATE 0.12 % MT SOLN
15.0000 mL | Freq: Once | OROMUCOSAL | Status: AC
  Administered 2022-02-15: 15 mL via OROMUCOSAL

## 2022-02-15 MED ORDER — DEXAMETHASONE SODIUM PHOSPHATE 10 MG/ML IJ SOLN
INTRAMUSCULAR | Status: AC
  Filled 2022-02-15: qty 1

## 2022-02-15 MED ORDER — METOPROLOL TARTRATE 5 MG/5ML IV SOLN: 5.0000 mg | Freq: Four times a day (QID) | INTRAVENOUS | Status: DC | PRN

## 2022-02-15 MED ORDER — CELECOXIB 200 MG PO CAPS
200.0000 mg | ORAL_CAPSULE | ORAL | Status: AC
  Administered 2022-02-15: 200 mg via ORAL
  Filled 2022-02-15: qty 1

## 2022-02-15 MED ORDER — LEVOTHYROXINE SODIUM 100 MCG PO TABS
100.0000 ug | ORAL_TABLET | Freq: Every day | ORAL | Status: DC
  Administered 2022-02-16: 100 ug via ORAL
  Filled 2022-02-15: qty 1

## 2022-02-15 MED ORDER — ADULT MULTIVITAMIN W/MINERALS CH
1.0000 | ORAL_TABLET | Freq: Every day | ORAL | Status: DC
  Administered 2022-02-16: 1 via ORAL
  Filled 2022-02-15: qty 1

## 2022-02-15 MED ORDER — ROCURONIUM BROMIDE 10 MG/ML (PF) SYRINGE
PREFILLED_SYRINGE | INTRAVENOUS | Status: AC
  Filled 2022-02-15: qty 10

## 2022-02-15 MED ORDER — DEXAMETHASONE SODIUM PHOSPHATE 10 MG/ML IJ SOLN
INTRAMUSCULAR | Status: DC | PRN
Start: 2022-02-15 — End: 2022-02-15
  Administered 2022-02-15: 5 mg via INTRAVENOUS

## 2022-02-15 MED ORDER — ALVIMOPAN 12 MG PO CAPS
12.0000 mg | ORAL_CAPSULE | ORAL | Status: AC
  Administered 2022-02-15: 12 mg via ORAL
  Filled 2022-02-15: qty 1

## 2022-02-15 MED ORDER — DEXMEDETOMIDINE (PRECEDEX) IN NS 20 MCG/5ML (4 MCG/ML) IV SYRINGE
PREFILLED_SYRINGE | INTRAVENOUS | Status: AC
  Filled 2022-02-15: qty 5

## 2022-02-15 MED ORDER — SODIUM CHLORIDE 0.9 % IV SOLN
2.0000 g | INTRAVENOUS | Status: AC
  Administered 2022-02-15: 2 g via INTRAVENOUS
  Filled 2022-02-15: qty 2

## 2022-02-15 MED ORDER — BUPIVACAINE LIPOSOME 1.3 % IJ SUSP: 20.0000 mL | Freq: Once | INTRAMUSCULAR | Status: DC

## 2022-02-15 MED ORDER — MIDAZOLAM HCL 2 MG/2ML IJ SOLN
INTRAMUSCULAR | Status: AC
  Filled 2022-02-15: qty 2

## 2022-02-15 MED ORDER — BUPIVACAINE LIPOSOME 1.3 % IJ SUSP
INTRAMUSCULAR | Status: AC
  Filled 2022-02-15: qty 20

## 2022-02-15 MED ORDER — KETAMINE HCL 50 MG/5ML IJ SOSY
PREFILLED_SYRINGE | INTRAMUSCULAR | Status: AC
  Filled 2022-02-15: qty 5

## 2022-02-15 MED ORDER — FENTANYL CITRATE (PF) 100 MCG/2ML IJ SOLN
INTRAMUSCULAR | Status: AC
  Filled 2022-02-15: qty 2

## 2022-02-15 MED ORDER — BISOPROLOL-HYDROCHLOROTHIAZIDE 10-6.25 MG PO TABS
1.0000 | ORAL_TABLET | Freq: Every day | ORAL | Status: DC
  Administered 2022-02-15 – 2022-02-16 (×2): 1 via ORAL
  Filled 2022-02-15 (×2): qty 1

## 2022-02-15 MED ORDER — SODIUM CHLORIDE 0.9 % IV SOLN: 250.0000 mL | INTRAVENOUS | Status: DC | PRN

## 2022-02-15 MED ORDER — FENTANYL CITRATE (PF) 250 MCG/5ML IJ SOLN
INTRAMUSCULAR | Status: AC
  Filled 2022-02-15: qty 5

## 2022-02-15 MED ORDER — BUPIVACAINE-EPINEPHRINE (PF) 0.25% -1:200000 IJ SOLN
INTRAMUSCULAR | Status: AC
  Filled 2022-02-15: qty 60

## 2022-02-15 MED ORDER — FENTANYL CITRATE PF 50 MCG/ML IJ SOSY
PREFILLED_SYRINGE | INTRAMUSCULAR | Status: AC
  Administered 2022-02-15: 50 ug via INTRAVENOUS
  Filled 2022-02-15: qty 2

## 2022-02-15 MED ORDER — ALVIMOPAN 12 MG PO CAPS
12.0000 mg | ORAL_CAPSULE | Freq: Two times a day (BID) | ORAL | Status: DC
  Administered 2022-02-16: 12 mg via ORAL
  Filled 2022-02-15: qty 1

## 2022-02-15 MED ORDER — BISACODYL 5 MG PO TBEC
20.0000 mg | DELAYED_RELEASE_TABLET | Freq: Once | ORAL | Status: AC
  Administered 2022-02-13: 20 mg via ORAL

## 2022-02-15 MED ORDER — LIDOCAINE HCL 2 % IJ SOLN
INTRAMUSCULAR | Status: AC
  Filled 2022-02-15: qty 20

## 2022-02-15 MED ORDER — ONDANSETRON HCL 4 MG PO TABS: 4.0000 mg | ORAL_TABLET | Freq: Four times a day (QID) | ORAL | Status: DC | PRN

## 2022-02-15 MED ORDER — ONDANSETRON HCL 4 MG/2ML IJ SOLN: 4.0000 mg | Freq: Four times a day (QID) | INTRAMUSCULAR | Status: DC | PRN

## 2022-02-15 MED ORDER — LIDOCAINE 2% (20 MG/ML) 5 ML SYRINGE
INTRAMUSCULAR | Status: DC | PRN
  Administered 2022-02-15: 100 mg via INTRAVENOUS

## 2022-02-15 MED ORDER — SUGAMMADEX SODIUM 500 MG/5ML IV SOLN
INTRAVENOUS | Status: AC
  Filled 2022-02-15: qty 5

## 2022-02-15 MED ORDER — HYDROXYZINE HCL 25 MG PO TABS: 50.0000 mg | ORAL_TABLET | Freq: Three times a day (TID) | ORAL | Status: DC | PRN

## 2022-02-15 MED ORDER — VITAMIN D 25 MCG (1000 UNIT) PO TABS
5000.0000 [IU] | ORAL_TABLET | ORAL | Status: DC
  Administered 2022-02-16: 5000 [IU] via ORAL
  Filled 2022-02-15: qty 5

## 2022-02-15 MED ORDER — DEXMEDETOMIDINE (PRECEDEX) IN NS 20 MCG/5ML (4 MCG/ML) IV SYRINGE
PREFILLED_SYRINGE | INTRAVENOUS | Status: DC | PRN
  Administered 2022-02-15: 8 ug via INTRAVENOUS
  Administered 2022-02-15: 4 ug via INTRAVENOUS
  Administered 2022-02-15: 8 ug via INTRAVENOUS

## 2022-02-15 MED ORDER — LIDOCAINE HCL (PF) 2 % IJ SOLN
INTRAMUSCULAR | Status: AC
  Filled 2022-02-15: qty 10

## 2022-02-15 MED ORDER — POLYETHYLENE GLYCOL 3350 17 GM/SCOOP PO POWD
1.0000 | Freq: Once | ORAL | Status: DC
  Filled 2022-02-15: qty 255

## 2022-02-15 MED ORDER — MELATONIN 3 MG PO TABS: 3.0000 mg | ORAL_TABLET | Freq: Every evening | ORAL | Status: DC | PRN

## 2022-02-15 MED ORDER — ENALAPRILAT 1.25 MG/ML IV SOLN
0.6250 mg | Freq: Four times a day (QID) | INTRAVENOUS | Status: DC | PRN
  Filled 2022-02-15: qty 1

## 2022-02-15 MED ORDER — HYDROMORPHONE HCL 1 MG/ML IJ SOLN: 0.5000 mg | INTRAMUSCULAR | Status: DC | PRN

## 2022-02-15 MED ORDER — NEOMYCIN SULFATE 500 MG PO TABS
1000.0000 mg | ORAL_TABLET | ORAL | Status: DC
  Filled 2022-02-15: qty 2

## 2022-02-15 MED ORDER — METHOCARBAMOL 500 MG PO TABS: 1000.0000 mg | ORAL_TABLET | Freq: Four times a day (QID) | ORAL | Status: DC | PRN

## 2022-02-15 MED ORDER — PROCHLORPERAZINE EDISYLATE 10 MG/2ML IJ SOLN: 5.0000 mg | Freq: Four times a day (QID) | INTRAMUSCULAR | Status: DC | PRN

## 2022-02-15 MED ORDER — LACTATED RINGERS IR SOLN
Status: DC | PRN
  Administered 2022-02-15: 1000 mL

## 2022-02-15 MED ORDER — ENSURE PRE-SURGERY PO LIQD
592.0000 mL | Freq: Once | ORAL | Status: AC
  Administered 2022-02-15: 592 mL via ORAL
  Filled 2022-02-15: qty 592

## 2022-02-15 MED ORDER — ONDANSETRON HCL 4 MG/2ML IJ SOLN
INTRAMUSCULAR | Status: DC | PRN
  Administered 2022-02-15: 4 mg via INTRAVENOUS

## 2022-02-15 MED ORDER — BUPIVACAINE LIPOSOME 1.3 % IJ SUSP
INTRAMUSCULAR | Status: DC | PRN
Start: 2022-02-15 — End: 2022-02-15
  Administered 2022-02-15: 20 mL

## 2022-02-15 MED ORDER — DIPHENHYDRAMINE HCL 50 MG/ML IJ SOLN: 12.5000 mg | Freq: Four times a day (QID) | INTRAMUSCULAR | Status: DC | PRN

## 2022-02-15 MED ORDER — MIDAZOLAM HCL 5 MG/5ML IJ SOLN
INTRAMUSCULAR | Status: DC | PRN
  Administered 2022-02-15: 2 mg via INTRAVENOUS

## 2022-02-15 MED ORDER — SODIUM CHLORIDE 0.9% FLUSH: 3.0000 mL | Freq: Two times a day (BID) | INTRAVENOUS | Status: DC

## 2022-02-15 MED ORDER — PROCHLORPERAZINE MALEATE 10 MG PO TABS
10.0000 mg | ORAL_TABLET | Freq: Four times a day (QID) | ORAL | Status: DC | PRN
  Filled 2022-02-15: qty 1

## 2022-02-15 MED ORDER — SODIUM CHLORIDE 0.9 % IV SOLN
2.0000 g | Freq: Two times a day (BID) | INTRAVENOUS | Status: AC
  Administered 2022-02-16: 2 g via INTRAVENOUS
  Filled 2022-02-15: qty 2

## 2022-02-15 MED ORDER — ENOXAPARIN SODIUM 40 MG/0.4ML IJ SOSY
40.0000 mg | PREFILLED_SYRINGE | Freq: Once | INTRAMUSCULAR | Status: AC
  Administered 2022-02-15: 40 mg via SUBCUTANEOUS
  Filled 2022-02-15: qty 0.4

## 2022-02-15 MED ORDER — LACTATED RINGERS IV BOLUS: 1000.0000 mL | Freq: Three times a day (TID) | INTRAVENOUS | Status: DC | PRN

## 2022-02-15 MED ORDER — SIMETHICONE 80 MG PO CHEW: 40.0000 mg | CHEWABLE_TABLET | Freq: Four times a day (QID) | ORAL | Status: DC | PRN

## 2022-02-15 MED ORDER — TRAMADOL HCL 50 MG PO TABS
50.0000 mg | ORAL_TABLET | Freq: Four times a day (QID) | ORAL | Status: DC | PRN
  Administered 2022-02-15: 100 mg via ORAL
  Administered 2022-02-16: 50 mg via ORAL
  Filled 2022-02-15: qty 1
  Filled 2022-02-15: qty 2

## 2022-02-15 MED ORDER — LACTATED RINGERS IV SOLN: INTRAVENOUS | Status: DC

## 2022-02-15 MED ORDER — SUGAMMADEX SODIUM 500 MG/5ML IV SOLN
INTRAVENOUS | Status: DC | PRN
  Administered 2022-02-15: 400 mg via INTRAVENOUS

## 2022-02-15 MED ORDER — CALCIUM POLYCARBOPHIL 625 MG PO TABS
625.0000 mg | ORAL_TABLET | Freq: Two times a day (BID) | ORAL | Status: DC
  Administered 2022-02-15 – 2022-02-16 (×2): 625 mg via ORAL
  Filled 2022-02-15 (×2): qty 1

## 2022-02-15 MED ORDER — SERTRALINE HCL 100 MG PO TABS
150.0000 mg | ORAL_TABLET | Freq: Every day | ORAL | Status: DC
  Administered 2022-02-15 – 2022-02-16 (×2): 150 mg via ORAL
  Filled 2022-02-15 (×2): qty 1

## 2022-02-15 MED ORDER — PROPOFOL 10 MG/ML IV BOLUS
INTRAVENOUS | Status: DC | PRN
  Administered 2022-02-15: 200 mg via INTRAVENOUS

## 2022-02-15 MED ORDER — OXYCODONE HCL 5 MG PO TABS: 5.0000 mg | ORAL_TABLET | Freq: Four times a day (QID) | ORAL | 0 refills | Status: DC | PRN

## 2022-02-15 MED ORDER — ARIPIPRAZOLE 10 MG PO TABS
10.0000 mg | ORAL_TABLET | Freq: Every day | ORAL | Status: DC
  Administered 2022-02-15 – 2022-02-16 (×2): 10 mg via ORAL
  Filled 2022-02-15 (×2): qty 1

## 2022-02-15 MED ORDER — ONDANSETRON HCL 4 MG/2ML IJ SOLN
INTRAMUSCULAR | Status: AC
  Filled 2022-02-15: qty 2

## 2022-02-15 MED ORDER — LACTATED RINGERS IV SOLN: INTRAVENOUS | Status: AC

## 2022-02-15 MED ORDER — PROPOFOL 10 MG/ML IV BOLUS
INTRAVENOUS | Status: AC
  Filled 2022-02-15: qty 20

## 2022-02-15 MED ORDER — ROCURONIUM BROMIDE 10 MG/ML (PF) SYRINGE
PREFILLED_SYRINGE | INTRAVENOUS | Status: DC | PRN
  Administered 2022-02-15: 80 mg via INTRAVENOUS
  Administered 2022-02-15: 30 mg via INTRAVENOUS

## 2022-02-15 MED ORDER — PANTOPRAZOLE SODIUM 40 MG PO TBEC
40.0000 mg | DELAYED_RELEASE_TABLET | Freq: Every day | ORAL | Status: DC
Start: 2022-02-16 — End: 2022-02-16
  Administered 2022-02-16: 40 mg via ORAL
  Filled 2022-02-15: qty 1

## 2022-02-15 MED ORDER — HYDRALAZINE HCL 20 MG/ML IJ SOLN: 10.0000 mg | INTRAMUSCULAR | Status: DC | PRN

## 2022-02-15 MED ORDER — ALUM & MAG HYDROXIDE-SIMETH 200-200-20 MG/5ML PO SUSP: 30.0000 mL | Freq: Four times a day (QID) | ORAL | Status: DC | PRN

## 2022-02-15 MED ORDER — FENTANYL CITRATE (PF) 250 MCG/5ML IJ SOLN
INTRAMUSCULAR | Status: DC | PRN
  Administered 2022-02-15 (×2): 100 ug via INTRAVENOUS
  Administered 2022-02-15 (×2): 50 ug via INTRAVENOUS

## 2022-02-15 MED ORDER — ENOXAPARIN SODIUM 40 MG/0.4ML IJ SOSY
40.0000 mg | PREFILLED_SYRINGE | INTRAMUSCULAR | Status: DC
  Administered 2022-02-16: 40 mg via SUBCUTANEOUS
  Filled 2022-02-15: qty 0.4

## 2022-02-15 MED ORDER — GABAPENTIN 300 MG PO CAPS
300.0000 mg | ORAL_CAPSULE | ORAL | Status: AC
  Administered 2022-02-15: 300 mg via ORAL
  Filled 2022-02-15: qty 1

## 2022-02-15 MED ORDER — LIP MEDEX EX OINT
1.0000 "application " | TOPICAL_OINTMENT | Freq: Two times a day (BID) | CUTANEOUS | Status: DC
  Administered 2022-02-15 – 2022-02-16 (×2): 1 via TOPICAL
  Filled 2022-02-15: qty 7

## 2022-02-15 MED ORDER — BUPIVACAINE-EPINEPHRINE (PF) 0.25% -1:200000 IJ SOLN
INTRAMUSCULAR | Status: DC | PRN
Start: 2022-02-15 — End: 2022-02-15
  Administered 2022-02-15: 60 mL

## 2022-02-15 MED ORDER — DIPHENHYDRAMINE HCL 12.5 MG/5ML PO ELIX
12.5000 mg | ORAL_SOLUTION | Freq: Four times a day (QID) | ORAL | Status: DC | PRN
Start: 2022-02-15 — End: 2022-02-16

## 2022-02-15 MED ORDER — KETAMINE HCL 10 MG/ML IJ SOLN
INTRAMUSCULAR | Status: DC | PRN
  Administered 2022-02-15: 20 mg via INTRAVENOUS
  Administered 2022-02-15: 30 mg via INTRAVENOUS

## 2022-02-15 MED ORDER — FENTANYL CITRATE PF 50 MCG/ML IJ SOSY: 25.0000 ug | PREFILLED_SYRINGE | INTRAMUSCULAR | Status: DC | PRN

## 2022-02-15 MED ORDER — PHENYLEPHRINE HCL-NACL 20-0.9 MG/250ML-% IV SOLN
INTRAVENOUS | Status: DC | PRN
  Administered 2022-02-15: 35 ug/min via INTRAVENOUS

## 2022-02-15 MED ORDER — ENSURE SURGERY PO LIQD
237.0000 mL | Freq: Two times a day (BID) | ORAL | Status: DC
  Administered 2022-02-15: 237 mL via ORAL

## 2022-02-15 MED ORDER — 0.9 % SODIUM CHLORIDE (POUR BTL) OPTIME
TOPICAL | Status: DC | PRN
  Administered 2022-02-15: 2000 mL

## 2022-02-15 MED ORDER — SODIUM CHLORIDE 0.9% FLUSH: 3.0000 mL | INTRAVENOUS | Status: DC | PRN

## 2022-02-15 MED ORDER — METRONIDAZOLE 500 MG PO TABS
1000.0000 mg | ORAL_TABLET | ORAL | Status: DC
  Filled 2022-02-15: qty 2

## 2022-02-15 MED ORDER — TRAZODONE HCL 100 MG PO TABS: 100.0000 mg | ORAL_TABLET | Freq: Every evening | ORAL | Status: DC | PRN

## 2022-02-15 MED ORDER — ENSURE PRE-SURGERY PO LIQD
296.0000 mL | Freq: Once | ORAL | Status: DC
  Filled 2022-02-15: qty 296

## 2022-02-15 MED ORDER — SODIUM CHLORIDE 0.9 % IV SOLN: Freq: Three times a day (TID) | INTRAVENOUS | Status: DC | PRN

## 2022-02-15 MED ORDER — LIDOCAINE 2% (20 MG/ML) 5 ML SYRINGE
INTRAMUSCULAR | Status: DC | PRN
Start: 2022-02-15 — End: 2022-02-15
  Administered 2022-02-15: 1.5 mg/kg/h via INTRAVENOUS

## 2022-02-15 SURGICAL SUPPLY — 114 items
APPLIER CLIP 5 13 M/L LIGAMAX5 (MISCELLANEOUS)
APPLIER CLIP ROT 10 11.4 M/L (STAPLE)
BAG COUNTER SPONGE SURGICOUNT (BAG) ×2 IMPLANT
BLADE EXTENDED COATED 6.5IN (ELECTRODE) IMPLANT
CANNULA REDUC XI 12-8 STAPL (CANNULA)
CANNULA REDUCER 12-8 DVNC XI (CANNULA) IMPLANT
CELLS DAT CNTRL 66122 CELL SVR (MISCELLANEOUS) IMPLANT
CHLORAPREP W/TINT 26 (MISCELLANEOUS) IMPLANT
CLIP APPLIE 5 13 M/L LIGAMAX5 (MISCELLANEOUS) IMPLANT
CLIP APPLIE ROT 10 11.4 M/L (STAPLE) IMPLANT
COVER SURGICAL LIGHT HANDLE (MISCELLANEOUS) ×4 IMPLANT
COVER TIP SHEARS 8 DVNC (MISCELLANEOUS) ×1 IMPLANT
COVER TIP SHEARS 8MM DA VINCI (MISCELLANEOUS) ×1
DEVICE TROCAR PUNCTURE CLOSURE (ENDOMECHANICALS) IMPLANT
DRAIN CHANNEL 19F RND (DRAIN) IMPLANT
DRAPE ARM DVNC X/XI (DISPOSABLE) ×4 IMPLANT
DRAPE COLUMN DVNC XI (DISPOSABLE) ×1 IMPLANT
DRAPE DA VINCI XI ARM (DISPOSABLE) ×4
DRAPE DA VINCI XI COLUMN (DISPOSABLE) ×1
DRAPE SURG IRRIG POUCH 19X23 (DRAPES) ×2 IMPLANT
DRSG OPSITE POSTOP 4X10 (GAUZE/BANDAGES/DRESSINGS) IMPLANT
DRSG OPSITE POSTOP 4X6 (GAUZE/BANDAGES/DRESSINGS) IMPLANT
DRSG OPSITE POSTOP 4X8 (GAUZE/BANDAGES/DRESSINGS) IMPLANT
DRSG TEGADERM 2-3/8X2-3/4 SM (GAUZE/BANDAGES/DRESSINGS) ×10 IMPLANT
DRSG TEGADERM 4X4.75 (GAUZE/BANDAGES/DRESSINGS) IMPLANT
ELECT PENCIL ROCKER SW 15FT (MISCELLANEOUS) ×2 IMPLANT
ELECT REM PT RETURN 15FT ADLT (MISCELLANEOUS) ×2 IMPLANT
ENDOLOOP SUT PDS II  0 18 (SUTURE)
ENDOLOOP SUT PDS II 0 18 (SUTURE) IMPLANT
EVACUATOR SILICONE 100CC (DRAIN) IMPLANT
GAUZE SPONGE 2X2 8PLY STRL LF (GAUZE/BANDAGES/DRESSINGS) ×1 IMPLANT
GLOVE ECLIPSE 8.0 STRL XLNG CF (GLOVE) ×6 IMPLANT
GLOVE INDICATOR 8.0 STRL GRN (GLOVE) ×6 IMPLANT
GOWN STRL REUS W/ TWL XL LVL3 (GOWN DISPOSABLE) ×4 IMPLANT
GOWN STRL REUS W/TWL XL LVL3 (GOWN DISPOSABLE) ×4
GRASPER SUT TROCAR 14GX15 (MISCELLANEOUS) IMPLANT
HOLDER FOLEY CATH W/STRAP (MISCELLANEOUS) ×2 IMPLANT
IRRIG SUCT STRYKERFLOW 2 WTIP (MISCELLANEOUS) ×2
IRRIGATION SUCT STRKRFLW 2 WTP (MISCELLANEOUS) ×1 IMPLANT
KIT PROCEDURE DA VINCI SI (MISCELLANEOUS)
KIT PROCEDURE DVNC SI (MISCELLANEOUS) IMPLANT
KIT SIGMOIDOSCOPE (SET/KITS/TRAYS/PACK) IMPLANT
KIT TURNOVER KIT A (KITS) ×1 IMPLANT
NDL INSUFFLATION 14GA 120MM (NEEDLE) ×1 IMPLANT
NEEDLE INSUFFLATION 14GA 120MM (NEEDLE) ×2 IMPLANT
PACK CARDIOVASCULAR III (CUSTOM PROCEDURE TRAY) ×2 IMPLANT
PACK COLON (CUSTOM PROCEDURE TRAY) ×2 IMPLANT
PAD POSITIONING PINK XL (MISCELLANEOUS) ×2 IMPLANT
POUCH RETRIEVAL ECOSAC 10 (ENDOMECHANICALS) IMPLANT
POUCH RETRIEVAL ECOSAC 10MM (ENDOMECHANICALS) ×1
PROTECTOR NERVE ULNAR (MISCELLANEOUS) ×4 IMPLANT
RELOAD STAPLE 45 3.5 BLU DVNC (STAPLE) IMPLANT
RELOAD STAPLE 45 4.3 GRN DVNC (STAPLE) IMPLANT
RELOAD STAPLE 60 3.5 BLU DVNC (STAPLE) IMPLANT
RELOAD STAPLE 60 4.3 GRN DVNC (STAPLE) IMPLANT
RELOAD STAPLER 3.5X45 BLU DVNC (STAPLE) IMPLANT
RELOAD STAPLER 3.5X60 BLU DVNC (STAPLE) IMPLANT
RELOAD STAPLER 4.3X45 GRN DVNC (STAPLE) IMPLANT
RELOAD STAPLER 4.3X60 GRN DVNC (STAPLE) ×2 IMPLANT
RETRACTOR WND ALEXIS 18 MED (MISCELLANEOUS) IMPLANT
RTRCTR WOUND ALEXIS 18CM MED (MISCELLANEOUS)
SCISSORS LAP 5X35 DISP (ENDOMECHANICALS) ×2 IMPLANT
SEAL CANN UNIV 5-8 DVNC XI (MISCELLANEOUS) ×3 IMPLANT
SEAL XI 5MM-8MM UNIVERSAL (MISCELLANEOUS) ×3
SEALER VESSEL DA VINCI XI (MISCELLANEOUS) ×1
SEALER VESSEL EXT DVNC XI (MISCELLANEOUS) ×1 IMPLANT
SOLUTION ELECTROLUBE (MISCELLANEOUS) ×2 IMPLANT
SPIKE FLUID TRANSFER (MISCELLANEOUS) ×2 IMPLANT
SPONGE GAUZE 2X2 STER 10/PKG (GAUZE/BANDAGES/DRESSINGS) ×1
STAPLER 45 DA VINCI SURE FORM (STAPLE)
STAPLER 45 SUREFORM DVNC (STAPLE) IMPLANT
STAPLER 60 DA VINCI SURE FORM (STAPLE) ×1
STAPLER 60 SUREFORM DVNC (STAPLE) IMPLANT
STAPLER CANNULA SEAL DVNC XI (STAPLE) ×1 IMPLANT
STAPLER CANNULA SEAL XI (STAPLE) ×1
STAPLER ECHELON POWER CIR 29 (STAPLE) IMPLANT
STAPLER ECHELON POWER CIR 31 (STAPLE) IMPLANT
STAPLER RELOAD 3.5X45 BLU DVNC (STAPLE)
STAPLER RELOAD 3.5X45 BLUE (STAPLE)
STAPLER RELOAD 3.5X60 BLU DVNC (STAPLE)
STAPLER RELOAD 3.5X60 BLUE (STAPLE)
STAPLER RELOAD 4.3X45 GREEN (STAPLE)
STAPLER RELOAD 4.3X45 GRN DVNC (STAPLE)
STAPLER RELOAD 4.3X60 GREEN (STAPLE) ×2
STAPLER RELOAD 4.3X60 GRN DVNC (STAPLE) ×2
STOPCOCK 4 WAY LG BORE MALE ST (IV SETS) ×4 IMPLANT
SURGILUBE 2OZ TUBE FLIPTOP (MISCELLANEOUS) IMPLANT
SUT MNCRL AB 4-0 PS2 18 (SUTURE) ×2 IMPLANT
SUT PDS AB 1 CT1 27 (SUTURE) ×5 IMPLANT
SUT PROLENE 0 CT 2 (SUTURE) IMPLANT
SUT PROLENE 2 0 KS (SUTURE) IMPLANT
SUT PROLENE 2 0 SH DA (SUTURE) IMPLANT
SUT SILK 2 0 (SUTURE)
SUT SILK 2 0 SH CR/8 (SUTURE) IMPLANT
SUT SILK 2-0 18XBRD TIE 12 (SUTURE) IMPLANT
SUT SILK 3 0 (SUTURE)
SUT SILK 3 0 SH CR/8 (SUTURE) ×2 IMPLANT
SUT SILK 3-0 18XBRD TIE 12 (SUTURE) IMPLANT
SUT V-LOC BARB 180 2/0GR6 GS22 (SUTURE)
SUT VIC AB 3-0 SH 18 (SUTURE) IMPLANT
SUT VIC AB 3-0 SH 27 (SUTURE)
SUT VIC AB 3-0 SH 27XBRD (SUTURE) IMPLANT
SUT VICRYL 0 UR6 27IN ABS (SUTURE) ×2 IMPLANT
SUTURE V-LC BRB 180 2/0GR6GS22 (SUTURE) IMPLANT
SYR 10ML ECCENTRIC (SYRINGE) ×2 IMPLANT
SYS LAPSCP GELPORT 120MM (MISCELLANEOUS)
SYS WOUND ALEXIS 18CM MED (MISCELLANEOUS) ×2
SYSTEM LAPSCP GELPORT 120MM (MISCELLANEOUS) IMPLANT
SYSTEM WOUND ALEXIS 18CM MED (MISCELLANEOUS) ×1 IMPLANT
TOWEL OR NON WOVEN STRL DISP B (DISPOSABLE) ×2 IMPLANT
TRAY FOLEY MTR SLVR 16FR STAT (SET/KITS/TRAYS/PACK) ×2 IMPLANT
TROCAR ADV FIXATION 5X100MM (TROCAR) ×2 IMPLANT
TUBING CONNECTING 10 (TUBING) ×4 IMPLANT
TUBING INSUFFLATION 10FT LAP (TUBING) ×2 IMPLANT

## 2022-02-15 NOTE — Discharge Instructions (Signed)
SURGERY: POST OP INSTRUCTIONS ?(Surgery for small bowel obstruction, colon resection, etc) ? ? ?###################################################################### ? ?EAT ?Gradually transition to a high fiber diet with a fiber supplement over the next few days after discharge ? ?WALK ?Walk an hour a day.  Control your pain to do that.   ? ?CONTROL PAIN ?Control pain so that you can walk, sleep, tolerate sneezing/coughing, go up/down stairs. ? ?HAVE A BOWEL MOVEMENT DAILY ?Keep your bowels regular to avoid problems.  OK to try a laxative to override constipation.  OK to use an antidairrheal to slow down diarrhea.  Call if not better after 2 tries ? ?CALL IF YOU HAVE PROBLEMS/CONCERNS ?Call if you are still struggling despite following these instructions. ?Call if you have concerns not answered by these instructions ? ?###################################################################### ? ? ?DIET ?Follow a light diet the first few days at home.  Start with a bland diet such as soups, liquids, starchy foods, low fat foods, etc.  If you feel full, bloated, or constipated, stay on a ful liquid or pureed/blenderized diet for a few days until you feel better and no longer constipated. ?Be sure to drink plenty of fluids every day to avoid getting dehydrated (feeling dizzy, not urinating, etc.). ?Gradually add a fiber supplement to your diet over the next week.  Gradually get back to a regular solid diet.  Avoid fast food or heavy meals the first week as you are more likely to get nauseated. ?It is expected for your digestive tract to need a few months to get back to normal.  It is common for your bowel movements and stools to be irregular.  You will have occasional bloating and cramping that should eventually fade away.  Until you are eating solid food normally, off all pain medications, and back to regular activities; your bowels will not be normal. ?Focus on eating a low-fat, high fiber diet the rest of your life  (See Getting to [place], below). ? ?CARE of your INCISION or WOUND ?It is good for closed incision and even open wounds to be washed every day.  Shower every day.  Short baths are fine.  Wash the incisions and wounds clean with soap & water.    ? ?If you have a closed incision(s), wash the incision with soap & water every day.  You may leave closed incisions open to air if it is dry.   You may cover the incision with clean gauze & replace it after your daily shower for comfort. ? ?It is good for closed incisions and even open wounds to be washed every day.  Shower every day.  Short baths are fine.  Wash the incisions and wounds clean with soap & water.    ?You may leave closed incisions open to air if it is dry.   You may cover the incision with clean gauze & replace it after your daily shower for comfort. ? ?TEGADERM:  You have clear gauze band-aid dressings over your closed incision(s).  Remove the dressings 3 days after surgery. ? ? ?If you have an open wound with a wound vac, see wound vac care instructions. ? ? ? ? ?ACTIVITIES as tolerated ?Start light daily activities --- self-care, walking, climbing stairs-- beginning the day after surgery.  Gradually increase activities as tolerated.  Control your pain to be active.  Stop when you are tired.  Ideally, walk several times a day, eventually an hour a day.   ?Most people are back to most day-to-day activities in  a few weeks.  It takes 4-8 weeks to get back to unrestricted, intense activity. ?If you can walk 30 minutes without difficulty, it is safe to try more intense activity such as jogging, treadmill, bicycling, low-impact aerobics, swimming, etc. ?Save the most intensive and strenuous activity for last (Usually 4-8 weeks after surgery) such as sit-ups, heavy lifting, contact sports, etc.  Refrain from any intense heavy lifting or straining until you are off narcotics for pain control.  You will have off days, but things should improve  week-by-week. ?DO NOT PUSH THROUGH PAIN.  Let pain be your guide: If it hurts to do something, don't do it.  Pain is your body warning you to avoid that activity for another week until the pain goes down. ?You may drive when you are no longer taking narcotic prescription pain medication, you can comfortably wear a seatbelt, and you can safely make sudden turns/stops to protect yourself without hesitating due to pain. ?You may have sexual intercourse when it is comfortable. If it hurts to do something, stop. ? ?MEDICATIONS ?Take your usually prescribed home medications unless otherwise directed.   ?Blood thinners:  ?Usually you can restart any strong blood thinners after the second postoperative day.  It is OK to take aspirin right away.    ? If you are on strong blood thinners (warfarin/Coumadin, Plavix, Xerelto, Eliquis, Pradaxa, etc), discuss with your surgeon, medicine PCP, and/or cardiologist for instructions on when to restart the blood thinner & if blood monitoring is needed (PT/INR blood check, etc).   ? ? ?PAIN CONTROL ?Pain after surgery or related to activity is often due to strain/injury to muscle, tendon, nerves and/or incisions.  This pain is usually short-term and will improve in a few months.  ?To help speed the process of healing and to get back to regular activity more quickly, DO THE FOLLOWING THINGS TOGETHER: ?Increase activity gradually.  DO NOT PUSH THROUGH PAIN ?Use Ice and/or Heat ?Try Gentle Massage and/or Stretching ?Take over the counter pain medication ?Take Narcotic prescription pain medication for more severe pain ? ?Good pain control = faster recovery.  It is better to take more medicine to be more active than to stay in bed all day to avoid medications. ? Increase activity gradually ?Avoid heavy lifting at first, then increase to lifting as tolerated over the next 6 weeks. ?Do not ?push through? the pain.  Listen to your body and avoid positions and maneuvers than reproduce the pain.   Wait a few days before trying something more intense ?Walking an hour a day is encouraged to help your body recover faster and more safely.  Start slowly and stop when getting sore.  If you can walk 30 minutes without stopping or pain, you can try more intense activity (running, jogging, aerobics, cycling, swimming, treadmill, sex, sports, weightlifting, etc.) ?Remember: If it hurts to do it, then don?t do it! ?Use Ice and/or Heat ?You will have swelling and bruising around the incisions.  This will take several weeks to resolve. ?Ice packs or heating pads (6-8 times a day, 30-60 minutes at a time) will help sooth soreness & bruising. ?Some people prefer to use ice alone, heat alone, or alternate between ice & heat.  Experiment and see what works best for you.  Consider trying ice for the first few days to help decrease swelling and bruising; then, switch to heat to help relax sore spots and speed recovery. ?Shower every day.  Short baths are fine.  It feels good!  Keep the incisions and wounds clean with soap & water.   ?Try Gentle Massage and/or Stretching ?Massage at the area of pain many times a day ?Stop if you feel pain - do not overdo it ?Take over the counter pain medication ?This helps the muscle and nerve tissues become less irritable and calm down faster ?Choose ONE of the following over-the-counter anti-inflammatory medications: ?Acetaminophen '500mg'$  tabs (Tylenol) 1-2 pills with every meal and just before bedtime (avoid if you have liver problems or if you have acetaminophen in you narcotic prescription) ?Naproxen '220mg'$  tabs (ex. Aleve, Naprosyn) 1-2 pills twice a day (avoid if you have kidney, stomach, IBD, or bleeding problems) ?Ibuprofen '200mg'$  tabs (ex. Advil, Motrin) 3-4 pills with every meal and just before bedtime (avoid if you have kidney, stomach, IBD, or bleeding problems) ?Take with food/snack several times a day as directed for at least 2 weeks to help keep pain / soreness down & more  manageable. ?Take Narcotic prescription pain medication for more severe pain ?A prescription for strong pain control is often given to you upon discharge (for example: oxycodone/Percocet, hydrocodone/Norco/Vicodin, or tramad

## 2022-02-15 NOTE — Interval H&P Note (Signed)
History and Physical Interval Note: ? ?02/15/2022 ?11:10 AM ? ?Kyle Gonzales  has presented today for surgery, with the diagnosis of RECURRENT APPENDICITIS, HISTORY OF SCLEROSING CHOLANGITIS.   ?COLONOSCOPY yesterday underwhelming - no polyp/mass/UC. ?The various methods of treatment have been discussed with the patient and family. After consideration of risks, benefits and other options for treatment, the patient has consented to  Procedure(s): ?XI ROBOTIC LAPAROSCOPIC ASSISTED APPENDECTOMY, POSSIBLE PROXIMAL COLECTOMY (N/A) ?LYSIS OF ADHESION (N/A) as a surgical intervention.  The patient's history has been reviewed, patient examined, no change in status, stable for surgery.  I have reviewed the patient's chart and labs.  Questions were answered to the patient's satisfaction.   ? ?I have re-reviewed the the patient's records, history, medications, and allergies.  I have re-examined the patient.  I again discussed intraoperative plans and goals of post-operative recovery.  The patient agrees to proceed. ? ?Kyle Gonzales  ?[birth year] ?[id number] ? ?Patient Care Team: ?Pcp, No as PCP - General ?Drazek, Dawn, CRNP as Nurse Practitioner (Transplant Hepatology) ?Armbruster, Carlota Raspberry, MD as Consulting Physician (Gastroenterology) ?Michael Boston, MD as Consulting Physician (General Surgery) ? ?Patient Active Problem List  ? Diagnosis Date Noted  ? 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  Priority: High  ? Non-compliance 01/14/2018  ?  Priority: Medium   ? Acute appendicitis 12/15/2021  ? Depression with anxiety 12/15/2021  ? Substance induced mood disorder ([place]) 07/14/2021  ? Severe episode of recurrent major depressive disorder, with psychotic features ([place]) 10/04/2020  ? Generalized anxiety disorder 10/04/2020  ? 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 Appendicitis 05/10/2020  ? Marijuana abuse 05/10/2020  ? Protein S deficiency ([place]) 08/04/2019  ? Abnormal glucose 08/04/2019  ? DVT (deep venous  thrombosis) ([place])   ? Benign essential HTN 04/15/2018  ? MDD (major depressive disorder), recurrent severe, without psychosis ([place]) 02/26/2018  ? Abnormal LFTs 04/08/2017  ? Drug addiction / Marajuana ([place]) 01/06/2017  ? Hyperlipidemia, mixed 01/11/2016  ? Medication management 01/11/2016  ? Palpitations 12/04/2015  ? Hypothyroidism 12/04/2015  ? Vitamin D deficiency 08/11/2015  ? ? ?Past Medical History:  ?Diagnosis Date  ? Anxiety   ? Clotting disorder ([place])   ? Depression   ? GERD (gastroesophageal reflux disease)   ? Graves disease 08/18/2015  ? Hyperlipidemia   ? Hypertension   ? Hypothyroid   ? Palpitations   ? PSC (primary sclerosing cholangitis)   ? Substance abuse ([place])   ? marijuana  ? 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 Vitamin D deficiency   ? ? ?Past Surgical History:  ?Procedure Laterality Date  ? ANTERIOR CRUCIATE LIGAMENT REPAIR  2012  ? CHOLECYSTECTOMY  2022  ? ERCP N/A 08/19/2018  ? Procedure: ENDOSCOPIC RETROGRADE CHOLANGIOPANCREATOGRAPHY (ERCP);  Surgeon: Milus Banister, MD;  Location: [facility];  Service: Endoscopy;  Laterality: N/A;  ? LAPAROSCOPIC APPENDECTOMY N/A 05/11/2020  ? Procedure: APPENDECTOMY LAPAROSCOPIC;  Surgeon: Johnathan Hausen, MD;  Location: [facility];  Service: General;  Laterality: N/A;  ? REMOVAL OF STONES  08/19/2018  ? Procedure: REMOVAL OF STONES;  Surgeon: Milus Banister, MD;  Location: [facility];  Service: Endoscopy;;  ? SPHINCTEROTOMY  08/19/2018  ? Procedure: SPHINCTEROTOMY;  Surgeon: Milus Banister, MD;  Location: [facility];  Service: Endoscopy;;  ? WISDOM TOOTH EXTRACTION    ? age 21  ? ? ?Social History  ? ?Socioeconomic History  ? Marital status: Single  ?  Spouse name: Not on file  ?  Number of children: 0  ? Years of education: Not on file  ? Highest education level: Not on file  ?Occupational History  ? Occupation: unemployed  ?Tobacco Use  ? Smoking status: Some Days  ?  Packs/day: 0.25  ?  Years: 2.00  ?  Pack years: 0.50  ?  Types:  Cigarettes  ? Smokeless tobacco: Never  ? Tobacco comments:  ?  Marijuana  ?Vaping Use  ? Vaping Use: Never used  ?Substance and Sexual Activity  ? Alcohol use: No  ? Drug use: Yes  ?  Frequency: 7.0 times per week  ?  Types: Marijuana  ? Sexual activity: Yes  ?Other Topics Concern  ? Not on file  ?Social History Narrative  ? Not on file  ? ?Social Determinants of Health  ? ?Financial Resource Strain: Not on file  ?Food Insecurity: Not on file  ?Transportation Needs: Not on file  ?Physical Activity: Not on file  ?Stress: Not on file  ?Social Connections: Not on file  ?Intimate Partner Violence: Not on file  ? ? ?Family History  ?Problem Relation Age of Onset  ? Diabetes Mother   ? Stomach cancer Maternal Grandfather   ? Diabetes Maternal Grandfather   ? ? ?Medications Prior to Admission  ?Medication Sig Dispense Refill Last Dose  ? apixaban (ELIQUIS) 5 MG TABS tablet TAKE 1 TABLET(5 MG) BY MOUTH TWICE DAILY 180 tablet 0 02/11/2022  ? ARIPiprazole (ABILIFY) 10 MG tablet Take 1 tablet (10 mg total) by mouth daily. 30 tablet 3 Past Week  ? bisoprolol-hydrochlorothiazide (ZIAC) 10-6.25 MG tablet Take 1 tablet Daily for BP - Last Refill . Chart closed . 30 tablet 0 Past Week  ? Cholecalciferol (VITAMIN D3) 5000 units CAPS Take 1 capsule (5,000 Units total) by mouth every other day. (Patient taking differently: Take 5,000 Units by mouth daily.)     ? ezetimibe (ZETIA) 10 MG tablet Take  1 tablet  Daily  for Cholesterol 90 tablet 1 Past Week  ? hydrOXYzine (ATARAX) 50 MG tablet TAKE ONE TABLET BY MOUTH THREE TIMES A DAY AS NEEDED FOR ANXIETY 90 tablet 1 Past Week  ? levothyroxine (SYNTHROID) 100 MCG tablet Take 1 tablet   Daily   on an empty stomach with only water for 30 minutes & no Antacid meds, Calcium or Magnesium for 4 hours & avoid Biotin 90 tablet 1 02/15/2022 at 0800  ? Multiple Vitamins-Minerals (MENS MULTIVITAMIN) TABS Take 1 tablet by mouth daily.   Past Week  ? omeprazole (PRILOSEC) 40 MG capsule Take 1 capsule  (40 mg total) by mouth daily. 90 capsule 1 Past Week  ? oxyCODONE (OXY IR/ROXICODONE) 5 MG immediate release tablet Take 1 tablet (5 mg total) by mouth every 6 (six) hours as needed for moderate pain. 10 tablet 0 Past Week  ? rosuvastatin (CRESTOR) 10 MG tablet Take 1 tablet Daily for Cholesterol 90 tablet 1 Past Week  ? sertraline (ZOLOFT) 100 MG tablet Take 1.5 tablets (150 mg total) by mouth daily. 45 tablet 3 Past Week  ? traZODone (DESYREL) 100 MG tablet Take 100 mg by mouth at bedtime as needed for sleep.   Past Week  ? tadalafil (CIALIS) 20 MG tablet Take  1/2 to 1 tablet  every 2 to 3 days  as needed for XXXX 30 tablet 0   ? ? ?Current Facility-Administered Medications  ?Medication Dose Route Frequency Provider Last Rate Last Admin  ? bupivacaine liposome (EXPAREL) 1.3 % injection 266 mg  20 mL  Infiltration Once Michael Boston, MD      ? cefoTEtan (CEFOTAN) 2 g in sodium chloride 0.9 % 100 mL IVPB  2 g Intravenous On Call to OR Michael Boston, MD      ? enoxaparin (LOVENOX) injection 40 mg  40 mg Subcutaneous Once Michael Boston, MD      ? Derrill Memo ON 02/16/2022] feeding supplement (ENSURE PRE-SURGERY) liquid 296 mL  296 mL Oral Once Michael Boston, MD      ? lactated ringers infusion   Intravenous Continuous Lynda Rainwater, MD      ? neomycin [facility]) tablet 1,000 mg  1,000 mg Oral 3 times per day Michael Boston, MD      ? And  ? metroNIDAZOLE (FLAGYL) tablet 1,000 mg  1,000 mg Oral 3 times per day Michael Boston, MD      ? polyethylene glycol powder (GLYCOLAX/MIRALAX) container 255 g  1 Container Oral Once Michael Boston, MD      ?  ? ?Allergies  ?Allergen Reactions  ? Tylenol [Acetaminophen] Other (See Comments)  ?  r/t elevated LFTs ?  ? ? ?BP (!) 151/71 (BP Location: Right Arm)   Pulse 71   Temp 98 ?F (36.7 ?C) (Oral)   Resp 15   SpO2 98%  ? ?Labs: ?No results found for this or any previous visit (from the past 48 hour(s)). ? ?Imaging / Studies: ?No results found. ?  ?.Adin Hector, M.D.,  F.A.C.S. ?Gastrointestinal and Minimally Invasive Surgery ?[facility] ?[address], [address] ?[place], [place] [address] ?[phone] Main / Paging ? ?02/15/2022 ?11:11 AM ? ? ? ?Adin Hector ? ?

## 2022-02-15 NOTE — H&P (Signed)
02/15/2022 ? ? ? ?REFERRING PHYSICIAN: Alesia Richards,* ? ?Patient Care Team: ?Alesia Richards, MD as PCP - General (Internal Medicine) ?Aylah Yeary, Adrian Saran, MD as Consulting Provider (General Surgery) ? ?PROVIDER: Hollace Kinnier, MD ? ?[facility] MRN: [id number] ?DOB: 1990/10/23 ?DATE OF ENCOUNTER: 02/15/2022 ? ? ?SUBJECTIVE  ? ?Chief Complaint: new problem (Hosp follow up w/ one of colorectal surgeons discuss appendectomy ) ? ? ?History of Present Illness: ?Kyle Gonzales is a 31 y.o. male who is seen today  ?as an office consultation at the request of Dr. Melford Aase  ?for evaluation of new problem (Hosp follow up w/ one of colorectal surgeons discuss appendectomy ) ?.  ? ?31 year old male. History of protein S deficiency chronically anticoagulated. Sclerosing cholangitis. Has had some substance abuse issues but seems stable in recovery. He came in with evidence of a ruptured appendicitis with abscess. Underwent laparoscopic lysis adhesions and attempted appendectomy 05/11/2020 by Dr. Hassell Done. Pathology showed tissue suspicious for at least part of the appendix. He recovered. Patient had postop CT which questioned an appendiceal remnant. Dr. Hassell Done recommended follow-up in a few months to consider reattempt at interval appendectomy after inflammation and had resolved. That did not happen. ? ?Patient had recurrent pain and went to the emergency room earlier this month with recurrent symptoms and had evidence of persistent appendiceal stump with appendicitis. Stabilized on antibiotics. Recommendation to follow-up with one of the surgeons group to does colorectal surgery. ? ?Today with his mother who helps take care of him. He has been off antibiotics. His appetite and energy level are not back to where they were but he is slowly getting better. Having some loose bowel movements but nothing too severe. ? ?Medical History: ? ?Past Medical History:  ?Diagnosis Date  ? Anxiety  ? DVT (deep venous thrombosis)  (CMS-HCC)  ? Thyroid disease  ? ?Patient Active Problem List  ?Diagnosis  ? Abnormal LFTs  ? Benign essential HTN  ? DVT (deep venous thrombosis) (CMS-HCC)  ? Gastroesophageal reflux disease  ? Depression with anxiety  ? Graves' disease  ? Hyperlipidemia, mixed  ? Hypertension  ? Hypothyroidism  ? Marijuana abuse  ? MDD (major depressive disorder), recurrent severe, without psychosis (CMS-HCC)  ? Mesenteric thrombosis (CMS-HCC)  ? Primary sclerosing cholangitis  ? Protein S deficiency (CMS-HCC)  ? Vitamin D deficiency  ? Acute appendicitis with localized peritonitis, without perforation, abscess, or gangrene  ? Chronic anticoagulation  ? BMI 35.0-35.9,adult  ? ?Past Surgical History:  ?Procedure Laterality Date  ? liver bx 2020  ? CHOLECYSTECTOMY  ? ? ?Allergies  ?Allergen Reactions  ? Acetaminophen Other (See Comments)  ?Other reaction(s): Other (See Comments) ?r/t elevated LFTs ?potential increased LFT's ?r/t elevated LFTs ? ? ?Current Outpatient Medications on File Prior to Visit  ?Medication Sig Dispense Refill  ? bisoproloL-hydroCHLOROthiazide (ZIAC) 10-6.25 mg tablet LAST REFILL - Take 1 tablet Daily for BP - ABSOLUTELY Must have Office Visit before Refill  ? ezetimibe (ZETIA) 10 mg tablet Take 1 tablet by mouth once daily  ? hydrOXYzine (ATARAX) 50 MG tablet Take 1 tablet by mouth 3 (three) times daily as needed  ? levothyroxine (SYNTHROID) 100 MCG tablet Take 1 tablet Daily on an empty stomach with only water for 30 minutes & no Antacid meds, Calcium or Magnesium for 4 hours & avoid Biotin  ? oxyCODONE (ROXICODONE) 5 MG immediate release tablet Take 1 tablet (5 mg total) by mouth every 6 (six) hours as needed for Pain 15 tablet 0  ? rosuvastatin (  CRESTOR) 10 MG tablet Take 1 tablet by mouth once daily  ? sertraline (ZOLOFT) 100 MG tablet Take by mouth  ? ?No current facility-administered medications on file prior to visit.  ? ?History reviewed. No pertinent family history.  ? ?Social History  ? ?Tobacco Use   ?Smoking Status Unknown  ?Smokeless Tobacco Not on file  ? ? ?Social History  ? ?Socioeconomic History  ? Marital status: Single  ?Tobacco Use  ? Smoking status: Unknown  ?Substance and Sexual Activity  ? Alcohol use: Defer  ? Drug use: Defer  ? ?############################################################ ? ?Review of Systems: ?A complete review of systems (ROS) was obtained from the patient. I have reviewed this information and discussed as appropriate with the patient. See HPI as well for other pertinent ROS. ? ?Constitutional: No fevers, chills, sweats. Weight stable ?Eyes: No vision changes, No discharge ?HENT: No sore throats, nasal drainage ?Lymph: No neck swelling, No bruising easily ?Pulmonary: No cough, productive sputum ?CV: No orthopnea, PND . No exertional chest/neck/shoulder/arm pain. Patient can walk 30 minutes without difficulty.  ? ?GI: No personal nor family history of GI/colon cancer, inflammatory bowel disease, irritable bowel syndrome, allergy such as Celiac Sprue, dietary/dairy problems, colitis, ulcers nor gastritis. No recent sick contacts/gastroenteritis. No travel outside the country. No changes in diet. ? ?Renal: No UTIs, No hematuria ?Genital: No drainage, bleeding, masses ?Musculoskeletal: No severe joint pain. Good ROM major joints ?Skin: No sores or lesions ?Heme/Lymph: No easy bleeding. No swollen lymph nodes ?Neuro: No active seizures. No facial droop ?Psych: No hallucinations. No agitation ? ?OBJECTIVE  ? ?Vitals:  ?01/09/22 1527  ?BP: 136/80  ?Pulse: 108  ?Temp: 36.8 ?C (98.3 ?F)  ?SpO2: 99%  ?Weight: (!) 127.5 kg (281 lb)  ?Height: 190.5 cm ('6\' 3"'$ )  ? ?Body mass index is 35.12 kg/m?. ? ?PHYSICAL EXAM: ? ?Constitutional: Not cachectic. Hygeine adequate. Vitals signs as above.  ?Eyes: Pupils reactive, normal extraocular movements. Sclera nonicteric ?Neuro: CN II-XII intact. No major focal sensory defects. No major motor deficits. ?Lymph: No head/neck/groin lymphadenopathy ?Psych:  No severe agitation. No severe anxiety. Judgment & insight Adequate, Oriented x4, ?HENT: Normocephalic, Mucus membranes moist. No thrush. Hearing: adequate ?Neck: Supple, No tracheal deviation. No obvious thyromegaly ?Chest: No pain to chest wall compression. Good respiratory excursion. No audible wheezing ?CV: Pulses intact. regular. No major extremity edema ?Ext: No obvious deformity or contracture. Edema: Not present. No cyanosis ?Skin: No major subcutaneous nodules. Warm and dry ?Musculoskeletal: Severe joint rigidity not present. No obvious clubbing. No digital petechiae. Mobility: no assist device moving easily without restrictions ? ?Abdomen: Obese Soft. Nondistended. Nontender. Hernia: Not present. Diastasis recti: Not present. No hepatomegaly. No splenomegaly. ? ?Genital/Pelvic: Inguinal hernia: Not present. Inguinal lymph nodes: without lymphadenopathy nor hidradenitis.  ? ?Rectal: (Deferred) ? ? ? ?################################################################### ? ?Labs, Imaging and Diagnostic Testing: ? ?Located in [place]' section of Epic EMR chart ? ?PRIOR [facility] NOTES: ? ?Located in [place]' section of Epic EMR chart ? ?SURGERY NOTES: ? ?Located in [place]' section of Epic EMR chart ? ?PATHOLOGY: ? ?Located in [place]' section of Epic EMR chart ? ?Assessment and Plan:  ?DIAGNOSES: ? ?Diagnoses and all orders for this visit: ? ?Acute appendicitis with localized peritonitis, without perforation, abscess, or gangrene ? ?Chronic anticoagulation ? ?BMI 35.0-35.9,adult ? ? ? ?ASSESSMENT/PLAN ? ?Young gentleman with numerous health issues including sclerosing cholangitis and protein S deficiency presenting prior mesenteric thrombosis now chronically anticoagulated with evidence of recurrent appendicitis due to persistent  appendiceal tissues. ? ?Given the fact he has had recurrent inflammation, I think he would benefit from completion appendectomy. Given his obesity  and prior surgeries and prior admissions and difficult dissection, I think would be wise to consider a robotic approach for lyse adhesions for appendectomy. Possible ileocolectomy if severe.  ? ?History of pr

## 2022-02-15 NOTE — Anesthesia Postprocedure Evaluation (Signed)
Anesthesia Post Note ? ?Patient: Kyle Gonzales ? ?Procedure(s) Performed: XI ROBOTIC LAPAROSCOPIC ASSISTED APPENDECTOMY PARTIAL CECECTOMY (Abdomen) ?ROBOTIC LYSIS OF ADHESIONS (Abdomen) ? ?  ? ?Patient location during evaluation: PACU ?Anesthesia Type: General ?Level of consciousness: awake and alert ?Pain management: pain level controlled ?Vital Signs Assessment: post-procedure vital signs reviewed and stable ?Respiratory status: spontaneous breathing, nonlabored ventilation, respiratory function stable and patient connected to nasal cannula oxygen ?Cardiovascular status: blood pressure returned to baseline and stable ?Postop Assessment: no apparent nausea or vomiting ?Anesthetic complications: no ? ? ?No notable events documented. ? ?Last Vitals:  ?Vitals:  ? 02/15/22 1430 02/15/22 1445  ?BP: (!) 143/81 (!) 148/85  ?Pulse: 77 77  ?Resp: 18 12  ?Temp:    ?SpO2: 100% 96%  ?  ?Last Pain:  ?Vitals:  ? 02/15/22 1445  ?TempSrc:   ?PainSc: 7   ? ? ?  ?  ?  ?  ?  ?  ? ?Kyle Gonzales ? ? ? ? ?

## 2022-02-15 NOTE — Progress Notes (Signed)
Patient wanting to leave AMA, MD notified. RN informed patient that MD wants a repeat CBC in the am to check hemoglobin. Patient agreed to stay overnight.  ?

## 2022-02-15 NOTE — Anesthesia Procedure Notes (Signed)
Procedure Name: Intubation ?Date/Time: 02/15/2022 12:11 PM ?Performed by: Maxwell Caul, CRNA ?Pre-anesthesia Checklist: Patient identified, Emergency Drugs available, Suction available and Patient being monitored ?Patient Re-evaluated:Patient Re-evaluated prior to induction ?Oxygen Delivery Method: Circle system utilized ?Preoxygenation: Pre-oxygenation with 100% oxygen ?Induction Type: IV induction ?Ventilation: Mask ventilation without difficulty ?Laryngoscope Size: Mac and 4 ?Grade View: Grade I ?Tube type: Oral ?Tube size: 7.5 mm ?Number of attempts: 1 ?Airway Equipment and Method: Stylet ?Placement Confirmation: ETT inserted through vocal cords under direct vision, positive ETCO2 and breath sounds checked- equal and bilateral ?Secured at: 22 cm ?Tube secured with: Tape ?Dental Injury: Teeth and Oropharynx as per pre-operative assessment  ? ? ? ? ?

## 2022-02-15 NOTE — Transfer of Care (Signed)
Immediate Anesthesia Transfer of Care Note ? ?Patient: Kyle Gonzales ? ?Procedure(s) Performed: XI ROBOTIC LAPAROSCOPIC ASSISTED APPENDECTOMY PARTIAL CECECTOMY (Abdomen) ?ROBOTIC LYSIS OF ADHESIONS (Abdomen) ? ?Patient Location: PACU ? ?Anesthesia Type:General ? ?Level of Consciousness: awake, alert  and oriented ? ?Airway & Oxygen Therapy: Patient Spontanous Breathing and Patient connected to face mask oxygen ? ?Post-op Assessment: Report given to RN and Post -op Vital signs reviewed and stable ? ?Post vital signs: Reviewed and stable ? ?Last Vitals:  ?Vitals Value Taken Time  ?BP 161/75 02/15/22 1421  ?Temp    ?Pulse 80 02/15/22 1426  ?Resp 17 02/15/22 1426  ?SpO2 100 % 02/15/22 1426  ?Vitals shown include unvalidated device data. ? ?Last Pain:  ?Vitals:  ? 02/15/22 1042  ?TempSrc:   ?PainSc: 0-No pain  ?   ? ?  ? ?Complications: No notable events documented. ?

## 2022-02-15 NOTE — Op Note (Signed)
02/15/2022 ? ?2:56 PM ? ?PATIENT:  Kyle Gonzales  31 y.o. male ? ?Patient Care Team: ?Pcp, No as PCP - General ?Drazek, Dawn, CRNP as Nurse Practitioner (Transplant Hepatology) ?Armbruster, Carlota Raspberry, MD as Consulting Physician (Gastroenterology) ?Michael Boston, MD as Consulting Physician (General Surgery) ? ?PRE-OPERATIVE DIAGNOSIS:  RECURRENT APPENDICITIS, HISTORY OF SCLEROSING CHOLANGITIS ? ?POST-OPERATIVE DIAGNOSIS:  RECURRENT APPENDICITIS, HISTORY OF SCLEROSING CHOLANGITIS ? ?PROCEDURE:   ?ROBOTIC APPENDECTOMY & PARTIAL CECECTOMY ?ROBOTIC LYSIS OF ADHESIONS x 60 min ?TRANSVERSUS ABDOMINIS PLANE (TAP) BLOCK - BILATERAL ? ?SURGEON:  Adin Hector, MD ? ?ASSISTANT: Leighton Ruff, MD, FACS, FASCRS ?An experienced assistant was required given the standard of surgical care given the complexity of the case.  This assistant was needed for exposure, dissection, suction, tissue approximation, retraction, perception, etc. ? ?  ? ?ANESTHESIA:    ? ?General ? ?Regional TRANSVERSUS ABDOMINIS PLANE (TAP) nerve block for perioperative & postoperative pain control provided with liposomal bupivacaine (Experel) mixed with 0.25% bupivacaine as a Bilateral TAP block x 68m each side at the level of the transverse abdominis & preperitoneal spaces along the flank at the anterior axillary line, from subcostal ridge to iliac crest under laparoscopic guidance  ? ?Local field block at port sites & extraction wound ? ?EBL:  Total I/O ?In: 1600 [I.V.:1500; IV Piggyback:100] ?Out: 225 [Urine:200; Blood:25] ? ?Delay start of Pharmacological VTE agent (>24hrs) due to surgical blood loss or risk of bleeding:  no ? ?DRAINS: none  ? ?SPECIMENS:   ?-appendix ?-cecal wall (with appendiceal orifice) ? ?DISPOSITION OF SPECIMEN:  PATHOLOGY ? ?COUNTS:  YES ? ?PLAN OF CARE: Admit to inpatient  ? ?PATIENT DISPOSITION:  PACU - hemodynamically stable. ? ?INDICATION:   ? ?Patient with multiple medical problems including protein S deficiency requiring  anticoagulation for prior DVT and sclerosing cholangitis.  Had evidence of appendicitis.  Attempted appendectomy done laparoscopically.  Slow but eventual recovery.  Patient had recurrent appendicitis.  CAT scan concern for appendiceal remnant remaining.  I recommended robotic lyse adhesions with probable completion appendectomy.  Possible ileal colectomy I recommended segmental resection: ? ?The anatomy & physiology of the digestive tract was discussed.  The pathophysiology was discussed.  Natural history risks without surgery was discussed.   I worked to give an overview of the disease and the frequent need to have multispecialty involvement.  I feel the risks of no intervention will lead to serious problems that outweigh the operative risks; therefore, I recommended a partial colectomy to remove the pathology.  Laparoscopic & open techniques were discussed.   ?Risks such as bleeding, infection, abscess, leak, reoperation, possible ostomy, hernia, heart attack, death, and other risks were discussed.  I noted a good likelihood this will help address the problem.   Goals of post-operative recovery were discussed as well.  We will work to minimize complications.  An educational handout on the pathology was given as well.  Questions were answered.   ? ?The patient expresses understanding & wishes to proceed with surgery. ? ?OR FINDINGS:  ? ?Patient had moderate edematous inflammation and adhesions in lower abdomen and pelvis involving omentum sigmoid colon especially ileocecal region. ? ?Patient had appendix with tip going down to the pelvis.  It had a proximal rupture and had somewhat self amputated.  Ventral able to isolate appendiceal stump by transecting cecal wall just a little bit external to the ileocecal valve ? ?CASE DATA: ? ?Type of patient?: Elective [facility] Private Case ? ?Status of Case? Elective Scheduled ? ?Infection Present  At Time Of Surgery (PATOS)?  PHLEGMON ? ? ? ?DESCRIPTION:  ? ?Informed consent was  confirmed.  The patient underwent general anaesthesia without difficulty.  The patient was positioned with arms tucked & secured appropriately.  VTE prevention in place.  The patient's abdomen was clipped, prepped, & draped in a sterile fashion.  Surgical timeout confirmed our plan. ? ?The patient was positioned in reverse Trendelenburg.  Abdominal entry was gained using Varess technique at the left subcostal ridge on the anterior abdominal wall.  No elevated EtCO2 noted.  Port placed.  Camera inspection revealed no injury.  Extra ports were carefully placed under direct laparoscopic visualization.  We docked the [employer] Vinci robot carefully and placed intstruments under visualization ? ?Found moderate omental adhesions that were carefully freed off using focused blunt and vessel sealer dissection.  Freed attachments to the sigmoid colon and anterior pelvis.  Was able to find the small bowel ileum and follow that more distally.  Could see the cecum plastered to the pelvis and retroperitoneum.  Eventually was able to mobilize the ileocecal region and a lateral to medial and then inferior to superior and finally medial to lateral way to help elevate all of that out of the pelvis.  Preserve the retroperitoneum.   ? ?There was a thickened phlegmonous tubular structure suspicious for appendix going to the ileocecal mesentery.  Work to free adhesions to the retroperitoneum pelvis and bladder and elevated up.  Came down to a base.  However it was apparent that this was the mesoappendix.  It looks suspicious for appendix but there is no connection to the cecum itself.  I focused on freeing interloop adhesions of distal ileum until I confirm the ileocecal valve.  The cecum injury was somewhat folded upon itself but eventually were able to partially straighten it out.  Could not find any great candidate for an appendiceal stump.  Skeletonized the redundant cecum that was inferolateral to the ileocecal valve.  I transected  across that using a robotic 60 mm green load stapler.  It took 2 firings.  I then transected through what appeared to be the mesentery of the ruptured/disrupted appendix ? ?We did copious irrigation and inspection confirmed no injury.  Meticulously inspected the staple line cecum and ileum sigmoid colon pelvis and elsewhere.  Ran the small bowel proximally.  No Meckel's diverticulum or other concerns.  No serosal injury.  No active bleeding.  Stable and intact. ? ?I placed the cecal wall and appendix and in EcoSac bag.  Removed and out of the stapler port in the left suprapubic region.  I opened up the specimens.  Opened up the cecum to even treated to clearly identify an appendiceal base.  I probed that and found a flattened appendiceal stump going about 2 cm.  I opened up the other specimen and confirmed that it was the appendix with a tubular lumen.  This confirmed that I had removed the appendiceal base with cecal wall and the remnant of the appendix.  Encouraging. ? ?I returned back to the abdomen and did copious irrigation.  I used a suture passer to pass #1 PDS through the left lower quadrant suprapubic stapler site to close the 2 cm facia defect to get the thickened specimens out.  We did reinspection of the abdomen.  Hemostasis was good.   Ureters, retroperitoneum, and bowel uninjured.  The anastomosis looked healthy.   Endoluminal gas was evacuated.  Ports removed.  I closed the skin at the port sites using  Monocryl stitch and sterile dressing.  ? ?Patient is being extubated go to recovery room. I discussed postop care with the patient in detail the office & in the holding area. Instructions are written. I discussed operative findings, updated the patient's status, discussed probable steps to recovery, and gave postoperative recommendations to the patient's mother .  Recommendations were made.  Questions were answered.  She expressed understanding & appreciation. ? ? ?Adin Hector, M.D.,  F.A.C.S. ?Gastrointestinal and Minimally Invasive Surgery ?[facility] ?[address], [address] ?[place], [place] [address] ?[phone] / Paging ? ? ?

## 2022-02-16 ENCOUNTER — Telehealth: Payer: Self-pay

## 2022-02-16 ENCOUNTER — Encounter (HOSPITAL_COMMUNITY): Payer: Self-pay | Admitting: Surgery

## 2022-02-16 DIAGNOSIS — K36 Other appendicitis: Secondary | ICD-10-CM | POA: Diagnosis not present

## 2022-02-16 LAB — BASIC METABOLIC PANEL
Anion gap: 11 (ref 5–15)
BUN: 9 mg/dL (ref 6–20)
CO2: 23 mmol/L (ref 22–32)
Calcium: 9.4 mg/dL (ref 8.9–10.3)
Chloride: 102 mmol/L (ref 98–111)
Creatinine, Ser: 1.09 mg/dL (ref 0.61–1.24)
GFR, Estimated: >60 mL/min (ref >60)
Glucose, Bld: 116 mg/dL — ABNORMAL HIGH (ref 70–99)
Potassium: 3.7 mmol/L (ref 3.5–5.1)
Sodium: 136 mmol/L (ref 135–145)

## 2022-02-16 LAB — CBC
HCT: 40.7 % (ref 39.0–52.0)
Hemoglobin: 13.3 g/dL (ref 13.0–17.0)
MCH: 30.4 pg (ref 26.0–34.0)
MCHC: 32.7 g/dL (ref 30.0–36.0)
MCV: 93.1 fL (ref 80.0–100.0)
Platelets: 200 10*3/uL (ref 150–400)
RBC: 4.37 MIL/uL (ref 4.22–5.81)
RDW: 14.7 % (ref 11.5–15.5)
WBC: 9.6 10*3/uL (ref 4.0–10.5)
nRBC: 0 % (ref 0.0–0.2)

## 2022-02-16 LAB — MAGNESIUM: Magnesium: 2 mg/dL (ref 1.7–2.4)

## 2022-02-16 NOTE — Discharge Summary (Signed)
Physician Discharge Summary  ? ? ?Patient ID: ?Kyle Gonzales ?MRN: [id number] ?DOB/AGE: [birth year]  ?31 y.o. ? ?Patient Care Team: ?Pcp, No as PCP - General ?Drazek, Dawn, CRNP as Nurse Practitioner (Transplant Hepatology) ?Armbruster, Carlota Raspberry, MD as Consulting Physician (Gastroenterology) ?Michael Boston, MD as Consulting Physician (General Surgery) ? ?Admit date: 02/15/2022 ? ?Discharge date: 02/16/2022 ? ?Hospital Stay = 1 days ? ? ? ?Discharge Diagnoses:  ?Principal Problem: ?  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  Primary sclerosing cholangitis ?  Benign essential HTN ?  Protein S deficiency ([place]) ?  Generalized anxiety disorder ?  History of DVT (deep vein thrombosis) ?  Chronic anticoagulation ? ? ?1 Day Post-Op  02/15/2022 ? ?POST-OPERATIVE DIAGNOSIS:  RECURRENT APPENDICITIS, HISTORY OF SCLEROSING CHOLANGITIS ?  ?PROCEDURE:   ?ROBOTIC APPENDECTOMY & PARTIAL CECECTOMY ?ROBOTIC LYSIS OF ADHESIONS x 60 min ?TRANSVERSUS ABDOMINIS PLANE (TAP) BLOCK - BILATERAL ?  ?SURGEON:  Adin Hector, MD ? ?OR FINDINGS:  ?  ?Patient had moderate edematous inflammation and adhesions in lower abdomen and pelvis involving omentum sigmoid colon especially ileocecal region. ?  ?Patient had appendix with tip going down to the pelvis.  It had a proximal rupture and had somewhat self amputated.  Ventral able to isolate appendiceal stump by transecting cecal wall just a little bit external to the ileocecal valve ? ?Consults: Pharmacy ? ?Hospital Course:  ? ?The patient underwent the surgery above.  Postoperatively, the patient gradually mobilized and advanced to a solid diet.  Patiently initially was about to leave AMA but was convinced to stay overnight for observation and pain control given his multiple medical problems had significant lysis of adhesions.  Pain and other symptoms were treated aggressively.   ? ?By the time of discharge, the patient was able to walk in the  hallways, eating food, having flatus.  Pain was well-controlled on an oral medications.  Based on meeting discharge criteria and continuing to recover, I felt it was safe for the patient to be discharged from the hospital to further recover with close followup. Postoperative recommendations were discussed in detail.  They are written as well. ? ?Discharged Condition: good ? ?Discharge Exam: ?Blood pressure 131/63, pulse 66, temperature 97.9 ?F (36.6 ?C), temperature source Oral, resp. rate 14, weight 132.2 kg, SpO2 98 %. ? ?General: Pt awake/alert/oriented x4 in No acute distress ?Eyes: PERRL, normal EOM.  Sclera clear.  No icterus ?Neuro: CN II-XII intact w/o focal sensory/motor deficits. ?Lymph: No head/neck/groin lymphadenopathy ?Psych:  No delerium/psychosis/paranoia ?HENT: Normocephalic, Mucus membranes moist.  No thrush ?Neck: Supple, No tracheal deviation ?Chest:  No chest wall pain w good excursion ?CV:  Pulses intact.  Regular rhythm ?MS: Normal AROM mjr joints.  No obvious deformity ?Abdomen: Soft.  Mildly distended.  Mildly tender at incisions only.  Dressings c/d/I.  No evidence of peritonitis.  No incarcerated hernias. ?Ext:  SCDs BLE.  No mjr edema.  No cyanosis ?Skin: No petechiae / purpura ? ? ?Disposition:  ? ? Follow-up Information   ? ? Michael Boston, MD Follow up in 3 week(s).   ?Specialties: General Surgery, Colon and Rectal Surgery ?Contact information: ?[address] ?[address] ?[place] [address] ?[phone] ? ? ?  ?  ? ?  ?  ? ?  ? ? ?Discharge disposition: 01-Home or Self Care ? ? ? ? ? ? ?Discharge Instructions   ? ? Call MD for:   Complete by: As directed ?  ? FEVER >101.5  F ?(Temperatures <101.66F occasionally happen and are not significant)  ? Call MD for:  extreme fatigue   Complete by: As directed ?  ? Call MD for:  persistant dizziness or light-headedness   Complete by: As directed ?  ? Call MD for:  persistant nausea and vomiting   Complete by: As directed ?  ? Call MD for:   redness, tenderness, or signs of infection (pain, swelling, redness, odor or green/yellow discharge around incision site)   Complete by: As directed ?  ? Call MD for:  severe uncontrolled pain   Complete by: As directed ?  ? Diet - low sodium heart healthy   Complete by: As directed ?  ? Follow a light diet the first few days at home.  Start with a bland diet such as soups, liquids, starchy foods, low fat foods, etc.   ?If you feel full, bloated, or constipated, stay on a full liquid or pureed/blenderized diet for a few days until you feel better and no longer constipated. ?Gradually get back to a regular solid diet.  Avoid fast food or heavy meals the first week as you are more likely to get nauseated.  ? Discharge instructions   Complete by: As directed ?  ? One the day of your discharge from the hospital (or the next business weekday), please call [facility] to set up or confirm an appointment to see your surgeon in the office for a follow-up appointment.  Usually it is 2-3 weeks after your surgery. ? ?Other concerns ?If you are not getting better after two weeks or are noticing you are getting worse, contact our office [phone] for further advice.  We may need to adjust your medications, re-evaluate you in the office, send you to the emergency room, or see what other things we can do to help. ?The clinic staff is available to answer your questions during regular business hours (8:30am-5pm).  Please don't hesitate to call and ask to speak to one of our nurses for clinical concerns.    ?A surgeon from [facility] is always on call at the hospitals 24 hours/day ?If you have a medical emergency, go to the nearest emergency room or call 911.  ? Discharge wound care:   Complete by: As directed ?  ? It is good for closed incisions and even open wounds to be washed every day.  Shower every day.  Short baths are fine.  Wash the incisions and wounds clean with soap & water.    ?You may  leave closed incisions open to air if it is dry.   You may cover the incision with clean gauze & replace it after your daily shower for comfort. ? ?TEGADERM:  You have clear gauze band-aid dressings over your closed incision(s).  Remove the dressings 3 days after surgery.  ? Driving Restrictions   Complete by: As directed ?  ? You may drive when you are no longer taking prescription pain medication, you can comfortably wear a seatbelt, and you can safely maneuver your car and apply brakes.  ? Increase activity slowly   Complete by: As directed ?  ? Lifting restrictions   Complete by: As directed ?  ? You may resume regular (light) daily activities beginning the next day-such as daily self-care, walking, climbing stairs-gradually increasing activities as tolerated.   ?If you can walk 30 minutes without difficulty, it is safe to try more intense activity such as jogging, treadmill, bicycling, low-impact aerobics, swimming,  etc. ?Save the most intensive and strenuous activity for last such as sit-ups, heavy lifting, contact sports, etc   ?Refrain from any heavy lifting or straining until you are off narcotics for pain control.   ?DO NOT PUSH THROUGH PAIN.   ?Let pain be your guide: If it hurts to do something, don't do it.   ?Pain is your body warning you to avoid that activity for another week until the pain goes down.  ? May shower / Bathe   Complete by: As directed ?  ? Wash / shower every day.  You may shower over the dressings as they are waterproof.  Continue to shower over incision(s) after the dressing is off.  ? May walk up steps   Complete by: As directed ?  ? Sexual Activity Restrictions   Complete by: As directed ?  ? You may have sexual intercourse when it is comfortable. If it hurts to do something, stop.  ? ?  ? ? ?Allergies as of 02/16/2022   ? ?   Reactions  ? Tylenol [acetaminophen] Other (See Comments)  ? r/t elevated LFTs  ? ?  ? ?  ?Medication List  ?  ? ?TAKE these medications   ? ?apixaban 5 MG  Tabs tablet ?Commonly known as: Eliquis ?TAKE 1 TABLET(5 MG) BY MOUTH TWICE DAILY ?  ?ARIPiprazole 10 MG tablet ?Commonly known as: ABILIFY ?Take 1 tablet (10 mg total) by mouth daily. ?  ?bisoprolol-hydrochlorot

## 2022-02-16 NOTE — Telephone Encounter (Signed)
?  Follow up Call- ? ? ?  02/14/2022  ?  3:04 PM  ?Call back number  ?Post procedure Call Back phone  # [phone]  ?Permission to leave phone message Yes  ?  ? ?Patient questions: ? ?Do you have a fever, pain , or abdominal swelling? No. ?Pain Score  0 * ? ?Have you tolerated food without any problems? Yes.   ? ?Have you been able to return to your normal activities? Yes.   ? ?Do you have any questions about your discharge instructions: ?Diet   No. ?Medications  No. ?Follow up visit  No. ? ?Do you have questions or concerns about your Care? No. ? ?Actions: ?* If pain score is 4 or above: ?No action needed, pain <4. ? ? ?

## 2022-02-16 NOTE — Plan of Care (Signed)
?Problem: Education: ?Goal: Knowledge of General Education information will improve ?Description: Including pain rating scale, medication(s)/side effects and non-pharmacologic comfort measures ?Outcome: Adequate for Discharge ?  ?Problem: Health Behavior/Discharge Planning: ?Goal: Ability to manage health-related needs will improve ?Outcome: Adequate for Discharge ?  ?Problem: Clinical Measurements: ?Goal: Ability to maintain clinical measurements within normal limits will improve ?Outcome: Adequate for Discharge ?Goal: Will remain free from infection ?Outcome: Adequate for Discharge ?Goal: Diagnostic test results will improve ?Outcome: Adequate for Discharge ?Goal: Respiratory complications will improve ?Outcome: Adequate for Discharge ?Goal: Cardiovascular complication will be avoided ?Outcome: Adequate for Discharge ?  ?Problem: Activity: ?Goal: Risk for activity intolerance will decrease ?Outcome: Adequate for Discharge ?  ?Problem: Nutrition: ?Goal: Adequate nutrition will be maintained ?Outcome: Adequate for Discharge ?  ?Problem: Coping: ?Goal: Level of anxiety will decrease ?Outcome: Adequate for Discharge ?  ?Problem: Elimination: ?Goal: Will not experience complications related to bowel motility ?Outcome: Adequate for Discharge ?Goal: Will not experience complications related to urinary retention ?Outcome: Adequate for Discharge ?  ?Problem: Pain Managment: ?Goal: General experience of comfort will improve ?Outcome: Adequate for Discharge ?  ?Problem: Safety: ?Goal: Ability to remain free from injury will improve ?Outcome: Adequate for Discharge ?  ?Problem: Skin Integrity: ?Goal: Risk for impaired skin integrity will decrease ?Outcome: Adequate for Discharge ?  ?Problem: Education: ?Goal: Knowledge of General Education information will improve ?Description: Including pain rating scale, medication(s)/side effects and non-pharmacologic comfort measures ?Outcome: Adequate for Discharge ?  ?Problem: Health  Behavior/Discharge Planning: ?Goal: Ability to manage health-related needs will improve ?Outcome: Adequate for Discharge ?  ?Problem: Clinical Measurements: ?Goal: Ability to maintain clinical measurements within normal limits will improve ?Outcome: Adequate for Discharge ?Goal: Will remain free from infection ?Outcome: Adequate for Discharge ?Goal: Diagnostic test results will improve ?Outcome: Adequate for Discharge ?Goal: Respiratory complications will improve ?Outcome: Adequate for Discharge ?Goal: Cardiovascular complication will be avoided ?Outcome: Adequate for Discharge ?  ?Problem: Activity: ?Goal: Risk for activity intolerance will decrease ?Outcome: Adequate for Discharge ?  ?Problem: Nutrition: ?Goal: Adequate nutrition will be maintained ?Outcome: Adequate for Discharge ?  ?Problem: Coping: ?Goal: Level of anxiety will decrease ?Outcome: Adequate for Discharge ?  ?Problem: Elimination: ?Goal: Will not experience complications related to bowel motility ?Outcome: Adequate for Discharge ?Goal: Will not experience complications related to urinary retention ?Outcome: Adequate for Discharge ?  ?Problem: Pain Managment: ?Goal: General experience of comfort will improve ?Outcome: Adequate for Discharge ?  ?Problem: Safety: ?Goal: Ability to remain free from injury will improve ?Outcome: Adequate for Discharge ?  ?Problem: Skin Integrity: ?Goal: Risk for impaired skin integrity will decrease ?Outcome: Adequate for Discharge ?  ?Problem: Education: ?Goal: Knowledge of General Education information will improve ?Description: Including pain rating scale, medication(s)/side effects and non-pharmacologic comfort measures ?Outcome: Adequate for Discharge ?  ?Problem: Health Behavior/Discharge Planning: ?Goal: Ability to manage health-related needs will improve ?Outcome: Adequate for Discharge ?  ?Problem: Clinical Measurements: ?Goal: Ability to maintain clinical measurements within normal limits will  improve ?Outcome: Adequate for Discharge ?Goal: Will remain free from infection ?Outcome: Adequate for Discharge ?Goal: Diagnostic test results will improve ?Outcome: Adequate for Discharge ?Goal: Respiratory complications will improve ?Outcome: Adequate for Discharge ?Goal: Cardiovascular complication will be avoided ?Outcome: Adequate for Discharge ?  ?Problem: Activity: ?Goal: Risk for activity intolerance will decrease ?Outcome: Adequate for Discharge ?  ?Problem: Nutrition: ?Goal: Adequate nutrition will be maintained ?Outcome: Adequate for Discharge ?  ?Problem: Coping: ?Goal: Level of anxiety will decrease ?Outcome: Adequate for  Discharge ?  ?Problem: Elimination: ?Goal: Will not experience complications related to bowel motility ?Outcome: Adequate for Discharge ?Goal: Will not experience complications related to urinary retention ?Outcome: Adequate for Discharge ?  ?Problem: Pain Managment: ?Goal: General experience of comfort will improve ?Outcome: Adequate for Discharge ?  ?Problem: Safety: ?Goal: Ability to remain free from injury will improve ?Outcome: Adequate for Discharge ?  ?Problem: Skin Integrity: ?Goal: Risk for impaired skin integrity will decrease ?Outcome: Adequate for Discharge ?  ?

## 2022-02-16 NOTE — Progress Notes (Signed)
Assessment unchanged. Pt and mother verbalized understanding of dc instructions through teach back. Discharged via foot per request to front entrance accompanied by mother and NT. ?

## 2022-02-17 LAB — SURGICAL PATHOLOGY

## 2022-02-17 NOTE — Addendum Note (Signed)
Addendum  created 02/17/22 1039 by Maxwell Caul, CRNA  ? Intraprocedure Meds edited  ?  ?

## 2022-02-20 ENCOUNTER — Encounter: Payer: Self-pay | Admitting: Gastroenterology

## 2022-03-02 ENCOUNTER — Ambulatory Visit: Payer: Self-pay | Admitting: Internal Medicine

## 2022-05-01 ENCOUNTER — Other Ambulatory Visit (HOSPITAL_COMMUNITY): Payer: Self-pay | Admitting: Psychiatry

## 2022-05-01 DIAGNOSIS — F411 Generalized anxiety disorder: Secondary | ICD-10-CM

## 2022-05-01 DIAGNOSIS — F1994 Other psychoactive substance use, unspecified with psychoactive substance-induced mood disorder: Secondary | ICD-10-CM

## 2022-05-08 ENCOUNTER — Encounter: Payer: Self-pay | Admitting: Internal Medicine

## 2022-06-07 ENCOUNTER — Encounter: Payer: Self-pay | Admitting: Internal Medicine

## 2022-07-19 ENCOUNTER — Ambulatory Visit: Payer: Medicare Other | Admitting: Family Medicine

## 2022-08-22 NOTE — Progress Notes (Unsigned)
[place] MD Outpatient Progress Note  08/23/2022 7:40 PM Kyle Gonzales  MRN:  [id number]  Assessment:  Kyle Gonzales presents for follow-up evaluation. Today, 08/23/22, patient reports intermittent adherence to psychiatric medications, taking 1-2 times weekly.  He has difficulty identifying any particular reasons for poor adherence other than low motivation to take pills and denies any particular side effects.  He does feel that when he takes medications consistently they have been helpful for mood and anxiety.  He endorses that mood was overall stable up until 2 days ago when faced with acute stressor in relationship; brief therapeutic intervention and empathic listening was provided.  Explored options for current medication regimen including reduction of number of medications however patient opted to continue medications as prescribed and prioritize adherence as he feels this regimen has worked well for him in the past.  Patient also continues to smoke cannabis daily and was counseled on likely impact on mood and anxiety symptoms.  He presents as contemplative in reducing use.  Will not make any medication changes today; patient has been referred for individual psychotherapy.  Plan to return to care in 6 weeks.  Identifying Information: Kyle Gonzales is a 31 y.o. 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   Plan:  # MDD  GAD Past medication trials: Prozac, Celexa, Lexapro, Buspar, Xanax Status of problem: acute Interventions: -- Continue Abilify 10 mg daily  -- Continue Zoloft 150 mg daily  -- Continue trazodone 100 mg nightly **Expressed importance of daily  adherence to psychiatric medications  # Cannabis use Past medication trials: none Status of problem: acute; patient contemplative Interventions: -- Continue to provide psychoeducation and promote reduction/cessation of use  # Medication monitoring Past medication trials:  Status of problem:  Interventions: -- Lipid profile revealing for elevated CH, LDL, TG and Hgb A1c of 5.9 (prediabetes) (11/25/21); followed by PCP  Patient was given contact information for behavioral health clinic and was instructed to call 911 for emergencies.   Subjective:  Chief Complaint:  Chief Complaint  Patient presents with   Medication Management    Interval History:   Last seen by Eulis Canner, NP on 10/05/21. At that time, managed on:  Abilify 10 mg daily  Zoloft 150 mg daily  trazodone 100 mg nightly PRN sleep Atarax 50 mg TID PRN anxiety During that visit, reporting inconsistent adherence with Zoloft and daily cannabis use. Encouraged adherence to medications and cessation of cannabis use; no medication changes made.   Today, patient is laying in bed upon visit at [facility]. Reports he didn't follow up because wasn't taking medications like he was supposed to - "it was all on me." Has continued to take medications but infrequently; takes his medications 1-2 times a week. States he gets tired of taking medications leading to poor adherence although denies any adverse effects or other reasons for limited adherence.   Decided he wanted to reach back out to engage with therapy and get "things off his chest." Mood has been "not good" the past few days related to end of a relationship but prior to this was "good" and denies feeling depressed. Has been spending a lot of time in bed the past few days. Anxiety before recent stressor was "sometimes bad  and sometimes good" but worse in the past few days. Identifies minimal benefit from Atarax. Denies SI and states it has been a while since he last had thoughts  like this. Denies HI, AVH.   States sleep has been "on and off" but getting about 6 hours nightly. Not working - doesn't get out of the house much.   Continues to smoke weed but states he wants to quit due to impacts on his health. Has quit before about a year ago and maintained sobriety for 3-4 months. Notes he felt some withdrawal symptoms initially but then felt better. Not sure impact on mood/thinking.  Expresses interest in being on medications for mood and anxiety. When taking current regimen consistently, feels like it was helpful. Discussed option to use monotherapy for management of anxiety and depression to reduce pill burden however patient expresses preference to remain on current regimen given benefit in the past.  Expresses intent to begin taking consistently.  Expresses interest in establishing with therapy and referral has already been placed.  Discussed importance of self-care and engaging with other supports such as family and friends during this acute stressor.   Visit Diagnosis:    ICD-10-CM   1. MDD (major depressive disorder), recurrent severe, without psychosis ([place])  F33.2     2. Marijuana abuse  F12.10     3. 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, cannabis use Medication trials: Prozac, Celexa, Lexapro, Buspar, Xanax Hospitalizations:  yes - May 2019 for depression and passive SI Suicide attempts: denies Substance use:   -- Cannabis: 1 gram daily; last use yesterday  -- Denies other illicit drug use  -- Etoh: denies  -- Tobacco: 2 cigarettes a day  Past Medical History:  Past Medical History:  Diagnosis Date   Anxiety    Clotting disorder ([place])    Depression    GERD (gastroesophageal reflux disease)    Graves disease 08/18/2015   Hyperlipidemia    Hypertension    Hypothyroid    Palpitations    PSC (primary sclerosing cholangitis)    Substance abuse ([place])     marijuana   Thrombosis    superior mesenteric vein - 2019   Vitamin D deficiency     Past Surgical History:  Procedure Laterality Date   ANTERIOR CRUCIATE LIGAMENT REPAIR  2012   CHOLECYSTECTOMY  2022   ERCP N/A 08/19/2018   Procedure: ENDOSCOPIC RETROGRADE CHOLANGIOPANCREATOGRAPHY (ERCP);  Surgeon: Milus Banister, MD;  Location: [facility];  Service: Endoscopy;  Laterality: N/A;   LAPAROSCOPIC APPENDECTOMY N/A 05/11/2020   Procedure: APPENDECTOMY LAPAROSCOPIC;  Surgeon: Johnathan Hausen, MD;  Location: [facility];  Service: General;  Laterality: N/A;   LYSIS OF ADHESION N/A 02/15/2022   Procedure: ROBOTIC LYSIS OF ADHESIONS;  Surgeon: Michael Boston, MD;  Location: [facility];  Service: General;  Laterality: N/A;   REMOVAL OF STONES  08/19/2018   Procedure: REMOVAL OF STONES;  Surgeon: Milus Banister, MD;  Location: [facility];  Service: Endoscopy;;   SPHINCTEROTOMY  08/19/2018   Procedure: Joan Mayans;  Surgeon: Milus Banister, MD;  Location: [facility];  Service: Endoscopy;;   WISDOM TOOTH EXTRACTION     age 53   XI ROBOTIC LAPAROSCOPIC ASSISTED APPENDECTOMY N/A 02/15/2022   Procedure: XI ROBOTIC LAPAROSCOPIC ASSISTED APPENDECTOMY PARTIAL CECECTOMY;  Surgeon: Michael Boston, MD;  Location: [facility];  Service: General;  Laterality: N/A;    Family Psychiatric History:  denies  Family History:  Family History  Problem Relation Age of Onset   Diabetes Mother    Stomach cancer Maternal Grandfather    Diabetes Maternal Grandfather     Social History:  Social History   Socioeconomic History   Marital status: Single    Spouse name: Not on file   Number of children: 0   Years of education: Not on file   Highest education level: Not on file  Occupational History   Occupation: unemployed  Tobacco Use   Smoking status: Some Days    Packs/day: 0.25    Years: 2.00    Total pack years: 0.50    Types: Cigarettes   Smokeless tobacco: Never   Tobacco comments:    Marijuana   Vaping Use   Vaping Use: Never used  Substance and Sexual Activity   Alcohol use: No   Drug use: Yes    Frequency: 7.0 times per week    Types: Marijuana    Comment: 1 gram daily   Sexual activity: Yes  Other Topics Concern   Not on file  Social History Narrative   Not on file   Social Determinants of Health   Financial Resource Strain: Not on file  Food Insecurity: Not on file  Transportation Needs: Not on file  Physical Activity: Not on file  Stress: Not on file  Social Connections: Not on file    Allergies:  Allergies  Allergen Reactions   Tylenol [Acetaminophen] Other (See Comments)    r/t elevated LFTs     Current Medications: Current Outpatient Medications  Medication Sig Dispense Refill   ARIPiprazole (ABILIFY) 10 MG tablet Take 1 tablet (10 mg total) by mouth daily. 30 tablet 3   hydrOXYzine (ATARAX) 50 MG tablet TAKE ONE TABLET BY MOUTH THREE TIMES A DAY AS NEEDED FOR ANXIETY 90 tablet 1   omeprazole (PRILOSEC) 40 MG capsule Take 1 capsule (40 mg total) by mouth daily. 90 capsule 1   sertraline (ZOLOFT) 100 MG tablet TAKE 1 AND 1/2 TABLET BY MOUTH DAILY 135 tablet 3   apixaban (ELIQUIS) 5 MG TABS tablet TAKE 1 TABLET(5 MG) BY MOUTH TWICE DAILY 180 tablet 0   bisoprolol-hydrochlorothiazide (ZIAC) 10-6.25 MG tablet Take 1 tablet Daily for BP - Last Refill . Chart closed . 30 tablet 0   Cholecalciferol (VITAMIN D3) 5000 units CAPS Take 1 capsule (5,000 Units total) by mouth every other day. (Patient taking differently: Take 5,000 Units by mouth daily.)     ezetimibe (ZETIA) 10 MG tablet Take  1 tablet  Daily  for Cholesterol 90 tablet 1   levothyroxine (SYNTHROID) 100 MCG tablet Take 1 tablet   Daily   on an empty stomach with only water for 30 minutes & no Antacid meds, Calcium or Magnesium for 4 hours & avoid Biotin 90 tablet 1   Multiple Vitamins-Minerals (MENS MULTIVITAMIN) TABS Take 1 tablet by mouth daily.     oxyCODONE (OXY IR/ROXICODONE) 5 MG immediate  release tablet Take 1 tablet (5 mg total) by mouth every 6 (six) hours as needed for moderate pain, severe pain or breakthrough pain. 20 tablet 0   rosuvastatin (CRESTOR) 10 MG tablet Take 1 tablet Daily for Cholesterol 90 tablet 1   tadalafil (CIALIS) 20 MG tablet Take  1/2 to 1 tablet  every 2 to 3 days  as needed for XXXX 30 tablet 0   traZODone (DESYREL) 100 MG tablet Take 100 mg by mouth at bedtime as needed for sleep. (Patient  not taking: Reported on 08/23/2022)     No current facility-administered medications for this visit.    ROS: Does not endorse any physical complaints  Objective:  Psychiatric Specialty Exam: There were no vitals taken for this visit.There is no height or weight on file to calculate BMI.  General Appearance: Casual and Fairly Groomed  Eye Contact:  Fair  Speech:  Clear and Coherent and Normal Rate; decreased variation in tone  Volume:  Normal  Mood:   "down the past 2 days"  Affect:   Sad, frustrated  Thought Content:  Denies AVH, IOR, paranoia    Suicidal Thoughts:  No  Homicidal Thoughts:  No  Thought Process:  Goal Directed and Linear  Orientation:  Full (Time, Place, and Person)    Memory:   Grossly intact however has difficulty recalling details of medication regimen  Judgment:  Fair  Insight:  Fair  Concentration:  Concentration: Poor  Recall:  NA  Fund of Knowledge: Good  Language: Good  Psychomotor Activity:  Normal  Akathisia:  No  AIMS (if indicated): not done  Assets:  Communication Skills Desire for Improvement Housing Leisure Time Social Support Transportation  ADL's:  Intact  Cognition: WNL  Sleep:  Fair   PE: General: sits comfortably in view of camera; no acute distress  Pulm: no increased work of breathing on room air  MSK: all extremity movements appear intact  Neuro: no focal neurological deficits observed  Gait & Station: unable to assess by video    Metabolic Disorder Labs: Lab Results  Component Value Date    HGBA1C 5.9 (H) 11/25/2021   MPG 123 11/25/2021   MPG 117 05/05/2020   No results found for: "PROLACTIN" Lab Results  Component Value Date   CHOL 221 (H) 11/25/2021   TRIG 232 (H) 11/25/2021   HDL 53 11/25/2021   CHOLHDL 4.2 11/25/2021   VLDL 21 04/09/2017   LDLCALC 131 (H) 11/25/2021   LDLCALC 168 (H) 05/05/2020   Lab Results  Component Value Date   TSH 79.34 (H) 11/25/2021   TSH 98.31 (H) 05/05/2021    Therapeutic Level Labs: No results found for: "LITHIUM" No results found for: "VALPROATE" No results found for: "CBMZ"  Screenings:  AIMS    Flowsheet Row Admission (Discharged) from 02/26/2018 in [facility]  AIMS Total Score 0      AUDIT    Flowsheet Row Admission (Discharged) from 02/26/2018 in [facility]  Alcohol Use Disorder Identification Test Final Score (AUDIT) 0      GAD-7    Flowsheet Row Video Visit from 10/05/2021 in [facility] Video Visit from 07/14/2021 in [facility] Video Visit from 04/14/2021 in [facility] Office Visit from 10/04/2020 in [facility]  Total GAD-7 Score '18 17 21 21      '$ PHQ2-9    Flowsheet Row Video Visit from 10/05/2021 in [facility] Video Visit from 07/14/2021 in [facility] Office Visit from 05/05/2021 in [facility] Video Visit from 04/14/2021 in [facility] Office Visit from 10/04/2020 in [facility]  PHQ-2 Total Score 3 6 0 6 6  PHQ-9 Total Score 14 18 -- 23 21      Flowsheet Row Admission (Discharged) from 02/15/2022 in [facility] to Hosp-Admission (Discharged) from 12/15/2021 in [facility] Video Visit from 07/14/2021 in [facility] No Risk No Risk Error: Q7 should not be populated when Q6 is No       Collaboration of Care: Collaboration of Care: Medication Management AEB ongoing medication management, Psychiatrist AEB established with this provider, and Referral or follow-up with counselor/therapist AEB patient to follow-up with individual therapist  Patient/Guardian was advised Release of Information must be obtained prior to any record release in order to collaborate their care with an outside provider. Patient/Guardian was advised if they have not already done so to contact the registration department to sign all necessary forms in order for [place] to release information regarding their care.   Consent: Patient/Guardian gives verbal consent for treatment and assignment of benefits for services provided during this visit. Patient/Guardian expressed understanding and agreed to proceed.   Televisit via video: I connected with patient on 08/23/22 at  4:00 PM EST by a video enabled telemedicine application and verified that I am speaking with the correct person using two identifiers.  Location: Patient: Home address in [place] Provider: remote office in [place]   I discussed the limitations of evaluation and management by telemedicine and the availability of in person appointments. The patient expressed understanding and agreed to proceed.  I discussed the assessment and treatment plan with the patient. The patient was provided an opportunity to ask questions and all were answered. The patient agreed with the plan and demonstrated an understanding of the instructions.   The patient was advised to call back or seek an in-person evaluation if the symptoms worsen or if the condition fails to improve as anticipated.  I provided 60 minutes of non-face-to-face time during this encounter.  [place] A  08/23/2022, 7:40 PM

## 2022-08-23 ENCOUNTER — Ambulatory Visit (INDEPENDENT_AMBULATORY_CARE_PROVIDER_SITE_OTHER): Payer: Medicare Other | Admitting: Psychiatry

## 2022-08-23 ENCOUNTER — Encounter (HOSPITAL_COMMUNITY): Payer: Self-pay | Admitting: Psychiatry

## 2022-08-23 ENCOUNTER — Ambulatory Visit (HOSPITAL_COMMUNITY): Payer: Medicare Other | Admitting: Mental Health

## 2022-08-23 DIAGNOSIS — F333 Major depressive disorder, recurrent, severe with psychotic symptoms: Secondary | ICD-10-CM

## 2022-08-23 DIAGNOSIS — F411 Generalized anxiety disorder: Secondary | ICD-10-CM

## 2022-08-23 DIAGNOSIS — F1210 Cannabis abuse, uncomplicated: Secondary | ICD-10-CM | POA: Diagnosis not present

## 2022-08-23 DIAGNOSIS — F332 Major depressive disorder, recurrent severe without psychotic features: Secondary | ICD-10-CM

## 2022-08-23 NOTE — Patient Instructions (Addendum)
Thank you for attending your appointment today.  -- Start taking your medications every day so we can assess how they are working for your mood and anxiety.  -- Reduce and stop cannabis use.  -- Continue other medications as prescribed.  Please do not make any changes to medications without first discussing with your provider. If you are experiencing a psychiatric emergency, please call 911 or present to your nearest emergency department. Additional crisis, medication management, and therapy resources are included below.  [facility]  [address], [place], [place] [address] [phone] WALK-IN URGENT CARE 24/7 FOR ANYONE [address], [place], [place] Fax: [phone] [email or web address] *Interpreters available *Accepts all insurance and uninsured for Urgent Care needs *Accepts Medicaid and uninsured for outpatient treatment (below)      ONLY FOR [facility]  Below:    Outpatient New Patient Assessment/Therapy Walk-ins:        Monday -Thursday 8am until slots are full.        Every Friday 1pm-4pm  (first come, first served)                   New Patient Psychiatry/Medication Management        Monday-Friday 8am-11am (first come, first served)               For all walk-ins we ask that you arrive by 7:15am, because patients will be seen in the order of arrival.

## 2022-08-23 NOTE — Progress Notes (Signed)
Therapist connected to [place] and confirmed [employer] with pt. Informed pt and mother Marcel Gary) this therapist is not able to bill insurance and would have front desk reach out to reschedule with appropriate provider. Assessed for safety concerns with none reported.

## 2022-10-03 NOTE — Progress Notes (Unsigned)
MD Outpatient Progress Note  10/04/2022 1:41 PM Kyle Gonzales  MRN:  [id number]  Assessment:  Kyle Gonzales presents for follow-up evaluation. Today, 10/04/22, patient reports improvement in mood and anxiety; on last visit he was reporting acute relationship stressor however does not bring this up today. He endorses improved compliance with medication regimen and denies any side effects. He continues to engage in significant daily cannabis use and is contemplative regarding cessation; he identifies goal of reducing and stopping use next year due to concern for impacts on physical health. Will continue to support patient in this goal. No changes to medications at this time. He remains interested in being established with therapy for further support for mood and anxiety.  Plan to RTC in 2 months; plan for coverage while this writer is on leave was discussed.   Identifying Information: Kyle Gonzales is a 31 y.o. male with a history of GAD, MDD vs. 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   Plan:  # MDD  GAD Past medication trials: Prozac, Celexa, Lexapro, Buspar, Xanax Status of problem: acute Interventions: -- Continue Abilify 10 mg daily -- Continue Zoloft 150 mg daily  -- Continue Atarax 50 mg TID PRN anxiety **Expressed importance of daily adherence to psychiatric medications -- Patient expresses continued interest in psychotherapy; will have front desk reach out to patient to get him rescheduled as he missed initial appointment.  # Cannabis use disorder Past medication trials: none Status of problem: acute; patient contemplative Interventions: -- Continue to provide psychoeducation  and promote reduction/cessation of use  # Medication monitoring Past medication trials:  Status of problem:  Interventions: -- Lipid profile revealing for elevated CH, LDL, TG and Hgb A1c of 5.9 (prediabetes) (11/25/21); followed by PCP  Patient was given contact information for behavioral health clinic and was instructed to call 911 for emergencies.   Subjective:  Chief Complaint:  Chief Complaint  Patient presents with   Medication Management   Interval History:   Chart review: -- TSH 105.45 (H) on 10/02/22; free T4 0.6  Today, patient reports things have been going "pretty good" and identifies that he got some good news recently although does not elaborate. Reports anxiety is low. Reports compliance with medications and denies any recently missed doses. Reports he was previously missing thyroid medication but has been better about this.   Denies SI or hopelessness; denies HI; AVH.   Uses hydroxyzine a few times a week with noted benefit. Has not required trazodone for sleep at night and reports sleeping about 6-7 hours nightly.   Reports he continues to use cannabis but identifies goal to quit this year due to health and financial reasons - states he has concern that cannabis use may be worsening thyroid and liver function. Has tried to quit before and identifies withdrawal symptoms as barrier to maintaining substance cessation. Explored strategies for minimizing withdrawal (such as starting gradual reduction in cannabis use now) and increased support. Remains interested in psychotherapy for further support for anxiety/depression and would like to be rescheduled as he missed initial appt.   Visit Diagnosis:    ICD-10-CM   1. Substance induced mood disorder (HCC)  F19.94 ARIPiprazole (ABILIFY) 10 MG tablet    2. Generalized anxiety disorder  F41.1 hydrOXYzine (ATARAX) 50 MG tablet  3. Cannabis use disorder  F12.90     4. Episode of recurrent major depressive disorder,  unspecified depression episode severity ([place])  F33.9      Past Psychiatric History:  Diagnoses: GAD, MDD vs. substance induced mood disorder, cannabis use Medication trials: Prozac, Celexa, Lexapro, Buspar, Xanax Hospitalizations:  yes - May 2019 for depression and passive SI Suicide attempts: denies Substance use:   -- Cannabis: 0.5-1 gram daily; last use yesterday  -- Denies other illicit drug use  -- Etoh: denies  -- Tobacco: 2 cigarettes a day  Past Medical History:  Past Medical History:  Diagnosis Date   Anxiety    Clotting disorder ([place])    Depression    GERD (gastroesophageal reflux disease)    Graves disease 08/18/2015   Hyperlipidemia    Hypertension    Hypothyroid    Palpitations    PSC (primary sclerosing cholangitis)    Substance abuse ([place])    marijuana   Thrombosis    superior mesenteric vein - 2019   Vitamin D deficiency     Past Surgical History:  Procedure Laterality Date   ANTERIOR CRUCIATE LIGAMENT REPAIR  2012   CHOLECYSTECTOMY  2022   ERCP N/A 08/19/2018   Procedure: ENDOSCOPIC RETROGRADE CHOLANGIOPANCREATOGRAPHY (ERCP);  Surgeon: Milus Banister, MD;  Location: [facility];  Service: Endoscopy;  Laterality: N/A;   LAPAROSCOPIC APPENDECTOMY N/A 05/11/2020   Procedure: APPENDECTOMY LAPAROSCOPIC;  Surgeon: Johnathan Hausen, MD;  Location: [facility];  Service: General;  Laterality: N/A;   LYSIS OF ADHESION N/A 02/15/2022   Procedure: ROBOTIC LYSIS OF ADHESIONS;  Surgeon: Michael Boston, MD;  Location: [facility];  Service: General;  Laterality: N/A;   REMOVAL OF STONES  08/19/2018   Procedure: REMOVAL OF STONES;  Surgeon: Milus Banister, MD;  Location: [facility];  Service: Endoscopy;;   SPHINCTEROTOMY  08/19/2018   Procedure: Joan Mayans;  Surgeon: Milus Banister, MD;  Location: [facility];  Service: Endoscopy;;   WISDOM TOOTH EXTRACTION     age 26   XI ROBOTIC LAPAROSCOPIC ASSISTED APPENDECTOMY N/A 02/15/2022   Procedure: XI ROBOTIC LAPAROSCOPIC  ASSISTED APPENDECTOMY PARTIAL CECECTOMY;  Surgeon: Michael Boston, MD;  Location: [facility];  Service: General;  Laterality: N/A;    Family Psychiatric History: denies  Family History:  Family History  Problem Relation Age of Onset   Diabetes Mother    Stomach cancer Maternal Grandfather    Diabetes Maternal Grandfather     Social History:  Social History   Socioeconomic History   Marital status: Single    Spouse name: Not on file   Number of children: 0   Years of education: Not on file   Highest education level: Not on file  Occupational History   Occupation: unemployed  Tobacco Use   Smoking status: Some Days    Packs/day: 0.25    Years: 2.00    Total pack years: 0.50    Types: Cigarettes   Smokeless tobacco: Never   Tobacco comments:    Marijuana  Vaping Use   Vaping Use: Never used  Substance and Sexual Activity   Alcohol use: No   Drug use: Yes    Frequency: 7.0 times per week    Types: Marijuana    Comment: 0.5-1 gram daily   Sexual activity: Yes  Other Topics Concern   Not on file  Social History Narrative   Not on file   Social Determinants of Health   Financial Resource Strain: Not on file  Food Insecurity:  Not on file  Transportation Needs: Not on file  Physical Activity: Not on file  Stress: Not on file  Social Connections: Not on file    Allergies:  Allergies  Allergen Reactions   Tylenol [Acetaminophen] Other (See Comments)    r/t elevated LFTs     Current Medications: Current Outpatient Medications  Medication Sig Dispense Refill   levothyroxine (SYNTHROID) 100 MCG tablet Take 1 tablet   Daily   on an empty stomach with only water for 30 minutes & no Antacid meds, Calcium or Magnesium for 4 hours & avoid Biotin 90 tablet 1   sertraline (ZOLOFT) 100 MG tablet TAKE 1 AND 1/2 TABLET BY MOUTH DAILY 135 tablet 3   apixaban (ELIQUIS) 5 MG TABS tablet TAKE 1 TABLET(5 MG) BY MOUTH TWICE DAILY 180 tablet 0   ARIPiprazole (ABILIFY) 10 MG tablet  Take 1 tablet (10 mg total) by mouth daily. 30 tablet 2   bisoprolol-hydrochlorothiazide (ZIAC) 10-6.25 MG tablet Take 1 tablet Daily for BP - Last Refill . Chart closed . 30 tablet 0   Cholecalciferol (VITAMIN D3) 5000 units CAPS Take 1 capsule (5,000 Units total) by mouth every other day. (Patient taking differently: Take 5,000 Units by mouth daily.)     ezetimibe (ZETIA) 10 MG tablet Take  1 tablet  Daily  for Cholesterol 90 tablet 1   hydrOXYzine (ATARAX) 50 MG tablet Take 1 tablet (50 mg total) by mouth 3 (three) times daily as needed for anxiety. 90 tablet 2   Multiple Vitamins-Minerals (MENS MULTIVITAMIN) TABS Take 1 tablet by mouth daily.     omeprazole (PRILOSEC) 40 MG capsule Take 1 capsule (40 mg total) by mouth daily. 90 capsule 1   oxyCODONE (OXY IR/ROXICODONE) 5 MG immediate release tablet Take 1 tablet (5 mg total) by mouth every 6 (six) hours as needed for moderate pain, severe pain or breakthrough pain. 20 tablet 0   rosuvastatin (CRESTOR) 10 MG tablet Take 1 tablet Daily for Cholesterol 90 tablet 1   tadalafil (CIALIS) 20 MG tablet Take  1/2 to 1 tablet  every 2 to 3 days  as needed for XXXX 30 tablet 0   No current facility-administered medications for this visit.    ROS: Does not endorse any physical complaints  Objective:  Psychiatric Specialty Exam: There were no vitals taken for this visit.There is no height or weight on file to calculate BMI.  General Appearance: Casual and Fairly Groomed  Eye Contact:  Fair  Speech:  Clear and Coherent; somewhat slowed rate and decreased variation in tone  Volume:  Normal  Mood:   "pretty good"  Affect:   Euthymic; constricted  Thought Content:  Denies AVH, IOR, paranoia    Suicidal Thoughts:  No  Homicidal Thoughts:  No  Thought Process:  Overall Linear however vague at times  Orientation:  Full (Time, Place, and Person)    Memory:   Grossly intact however has difficulty recalling details of medication regimen and has to  verify by looking at bottles in cabinet  Judgment:  Fair  Insight:  Fair  Concentration:  Concentration: Poor  Recall:  NA  Fund of Knowledge: Good  Language: Good  Psychomotor Activity:  Normal  Akathisia:  No  AIMS (if indicated): not done  Assets:  Communication Skills Desire for Improvement Housing Leisure Time Social Support Transportation  ADL's:  Intact  Cognition: WNL  Sleep:  Fair   PE: General: sits comfortably in view of camera; no acute distress  Pulm:  no increased work of breathing on room air  MSK: all extremity movements appear intact  Neuro: no focal neurological deficits observed  Gait & Station: unable to assess by video    Metabolic Disorder Labs: Lab Results  Component Value Date   HGBA1C 5.9 (H) 11/25/2021   MPG 123 11/25/2021   MPG 117 05/05/2020   No results found for: "PROLACTIN" Lab Results  Component Value Date   CHOL 221 (H) 11/25/2021   TRIG 232 (H) 11/25/2021   HDL 53 11/25/2021   CHOLHDL 4.2 11/25/2021   VLDL 21 04/09/2017   LDLCALC 131 (H) 11/25/2021   LDLCALC 168 (H) 05/05/2020   Lab Results  Component Value Date   TSH 79.34 (H) 11/25/2021   TSH 98.31 (H) 05/05/2021    Therapeutic Level Labs: No results found for: "LITHIUM" No results found for: "VALPROATE" No results found for: "CBMZ"  Screenings:  [facility] Admission (Discharged) from 02/26/2018 in [facility]  AIMS Total Score 0      AUDIT    Flowsheet Row Admission (Discharged) from 02/26/2018 in [facility]  Alcohol Use Disorder Identification Test Final Score (AUDIT) 0      GAD-7    Flowsheet Row Video Visit from 10/05/2021 in [facility] Video Visit from 07/14/2021 in [facility] Video Visit from 04/14/2021 in [facility] Office Visit from 10/04/2020 in [facility]   Total GAD-7 Score '18 17 21 21      '$ PHQ2-9    Flowsheet Row Video Visit from 10/05/2021 in [facility] Video Visit from 07/14/2021 in [facility] Office Visit from 05/05/2021 in [facility] Video Visit from 04/14/2021 in [facility] Office Visit from 10/04/2020 in [facility]  PHQ-2 Total Score 3 6 0 6 6  PHQ-9 Total Score 14 18 -- 23 21      Flowsheet Row Admission (Discharged) from 02/15/2022 in [facility] to [facility]-Admission (Discharged) from 12/15/2021 in [facility] Video Visit from 07/14/2021 in [facility] No Risk No Risk Error: Q7 should not be populated when Q6 is No       Collaboration of Care: Collaboration of Care: Medication Management AEB ongoing medication management, Psychiatrist AEB established with this provider, and Referral or follow-up with counselor/therapist AEB patient to follow-up with individual therapist  Patient/Guardian was advised Release of Information must be obtained prior to any record release in order to collaborate their care with an outside provider. Patient/Guardian was advised if they have not already done so to contact the registration department to sign all necessary forms in order for [place] to release information regarding their care.   Consent: Patient/Guardian gives verbal consent for treatment and assignment of benefits for services provided during this visit. Patient/Guardian expressed understanding and agreed to proceed.   Televisit via video: I connected with patient on 10/04/22 at  1:00 PM EST by a video enabled telemedicine application and verified that I am speaking with the correct person using two identifiers.  Location: Patient: Home address in [place] Provider: clinic   I discussed the  limitations of evaluation and management by telemedicine and the availability of in person appointments. The patient expressed understanding and agreed to proceed.  I  discussed the assessment and treatment plan with the patient. The patient was provided an opportunity to ask questions and all were answered. The patient agreed with the plan and demonstrated an understanding of the instructions.   The patient was advised to call back or seek an in-person evaluation if the symptoms worsen or if the condition fails to improve as anticipated.  I provided 25 minutes of non-face-to-face time during this encounter.  Vernelle Wisner A  10/04/2022, 1:41 PM

## 2022-10-03 NOTE — Patient Instructions (Signed)
Thank you for attending your appointment today.  -- We did not make any medication changes today. Please continue medications as prescribed.  Please do not make any changes to medications without first discussing with your provider. If you are experiencing a psychiatric emergency, please call 911 or present to your nearest emergency department. Additional crisis, medication management, and therapy resources are included below.  [facility]  [address], [place], [place] [address] [phone] WALK-IN URGENT CARE 24/7 FOR ANYONE [address], [place], [place]  [phone] Fax: [phone] [email or web address] *Interpreters available *Accepts all insurance and uninsured for Urgent Care needs *Accepts Medicaid and uninsured for outpatient treatment (below)      ONLY FOR [place] Residents  Below:    Outpatient New Patient Assessment/Therapy Walk-ins:        Monday -Thursday 8am until slots are full.        Every Friday 1pm-4pm  (first come, first served)                   New Patient Psychiatry/Medication Management        Monday-Friday 8am-11am (first come, first served)               For all walk-ins we ask that you arrive by 7:15am, because patients will be seen in the order of arrival.   

## 2022-10-04 ENCOUNTER — Telehealth (INDEPENDENT_AMBULATORY_CARE_PROVIDER_SITE_OTHER): Payer: Medicare Other | Admitting: Psychiatry

## 2022-10-04 ENCOUNTER — Encounter (HOSPITAL_COMMUNITY): Payer: Self-pay | Admitting: Psychiatry

## 2022-10-04 ENCOUNTER — Telehealth (HOSPITAL_COMMUNITY): Payer: Self-pay | Admitting: Psychiatry

## 2022-10-04 DIAGNOSIS — F339 Major depressive disorder, recurrent, unspecified: Secondary | ICD-10-CM

## 2022-10-04 DIAGNOSIS — F1290 Cannabis use, unspecified, uncomplicated: Secondary | ICD-10-CM

## 2022-10-04 DIAGNOSIS — F1994 Other psychoactive substance use, unspecified with psychoactive substance-induced mood disorder: Secondary | ICD-10-CM | POA: Diagnosis not present

## 2022-10-04 DIAGNOSIS — F411 Generalized anxiety disorder: Secondary | ICD-10-CM | POA: Diagnosis not present

## 2022-10-04 MED ORDER — HYDROXYZINE HCL 50 MG PO TABS: 50.0000 mg | ORAL_TABLET | Freq: Three times a day (TID) | ORAL | 2 refills | Status: AC | PRN

## 2022-10-04 MED ORDER — ARIPIPRAZOLE 10 MG PO TABS: 10.0000 mg | ORAL_TABLET | Freq: Every day | ORAL | 2 refills | Status: DC

## 2022-10-07 ENCOUNTER — Other Ambulatory Visit (HOSPITAL_COMMUNITY): Payer: Self-pay | Admitting: Psychiatry

## 2022-10-07 DIAGNOSIS — F411 Generalized anxiety disorder: Secondary | ICD-10-CM

## 2022-10-13 ENCOUNTER — Other Ambulatory Visit (HOSPITAL_COMMUNITY): Payer: Self-pay | Admitting: Psychiatry

## 2022-10-13 DIAGNOSIS — F1994 Other psychoactive substance use, unspecified with psychoactive substance-induced mood disorder: Secondary | ICD-10-CM

## 2022-10-23 ENCOUNTER — Encounter: Payer: Self-pay | Admitting: Gastroenterology

## 2022-11-12 ENCOUNTER — Other Ambulatory Visit: Payer: Self-pay | Admitting: Gastroenterology

## 2022-11-12 DIAGNOSIS — K219 Gastro-esophageal reflux disease without esophagitis: Secondary | ICD-10-CM

## 2022-12-28 ENCOUNTER — Other Ambulatory Visit: Payer: Self-pay | Admitting: Nurse Practitioner

## 2022-12-28 DIAGNOSIS — K8301 Primary sclerosing cholangitis: Secondary | ICD-10-CM

## 2023-01-17 ENCOUNTER — Telehealth: Payer: Self-pay | Admitting: Gastroenterology

## 2023-01-17 NOTE — Telephone Encounter (Signed)
[facility]. Let them know he is due for an office visit

## 2023-01-17 NOTE — Telephone Encounter (Signed)
[facility] calling to follow up on Omeprazole request. Please advise

## 2023-01-26 ENCOUNTER — Inpatient Hospital Stay: Admission: RE | Admit: 2023-01-26 | Payer: Medicare Other | Source: Ambulatory Visit

## 2023-02-15 ENCOUNTER — Telehealth (HOSPITAL_COMMUNITY): Payer: Self-pay | Admitting: *Deleted

## 2023-02-15 NOTE — Telephone Encounter (Signed)
Mom called requesting appts for both therapy and provider, he has fallen out of care and not seen since Dec 23 with Dr Josephina Shih. Will forward this request to the front desk staff that does the scheduling and ask them to contact him for next appts.

## 2023-02-16 ENCOUNTER — Ambulatory Visit
Admission: RE | Admit: 2023-02-16 | Discharge: 2023-02-16 | Disposition: A | Discharge status: Home / Self Care | Payer: Medicare HMO | Source: Ambulatory Visit | Attending: Nurse Practitioner

## 2023-02-16 DIAGNOSIS — K8301 Primary sclerosing cholangitis: Secondary | ICD-10-CM

## 2023-02-16 MED ORDER — GADOPICLENOL 0.5 MMOL/ML IV SOLN
10.0000 mL | Freq: Once | INTRAVENOUS | Status: AC | PRN
  Administered 2023-02-16: 10 mL via INTRAVENOUS

## 2023-03-19 NOTE — Progress Notes (Unsigned)
MD Outpatient Progress Note  03/21/2023 1:43 PM Yale Wilner  MRN:  [id number]  Assessment:  Kyle Gonzales presents for follow-up evaluation. Today, 03/21/23, patient reports ongoing anxiety and intermittent irritability although endorses continued inconsistent medication adherence as well as daily heavy cannabis use. Patient has difficulty identifying barriers to medication adherence or changes that can be made to promote taking medications. Discussed that if same issues persist at next visit, may consider change to regimen as it appears his feeling that medications are not helpful are interfering with consistent use (although medications may not be helpful due to inability to reach therapeutic level). Uncontrolled hypothyroidism (due to inconsistently taking Synthroid) and cannabis use are both also likely contributing to symptomatology. Discussed goal of implementing more structure and routine into his day. Patient is scheduled for psychotherapy in 2 weeks; he would greatly benefit from behavioral activation.  Plan to RTC in 2 months by video.  Identifying Information: Kyle Gonzales is a 32 y.o. male with a history of GAD, MDD vs. 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   Plan:  # MDD  GAD Past medication trials: Prozac, Celexa, Lexapro, Buspar, Xanax Status of problem: acute Interventions: -- Continue Abilify 10 mg daily -- Continue Zoloft 150 mg daily  -- Continue Atarax 50 mg TID PRN anxiety **Expressed importance of daily adherence to psychiatric medications -- Patient is scheduled for individual psychotherapy with Stephan Minister [facility] on 04/03/23   # Cannabis  use disorder Past medication trials: none Status of problem: acute; patient contemplative Interventions: -- Continue to provide psychoeducation and promote reduction/cessation of use  # Medication monitoring Interventions: -- Lipid profile revealing for elevated CH, LDL, TG (11/25/21) and Hgb W0J of 6.0 (prediabetes) (08/10/22); followed by PCP  Patient was given contact information for behavioral health clinic and was instructed to call 911 for emergencies.   Subjective:  Chief Complaint:  Chief Complaint  Patient presents with   Medication Management   Interval History:   Patient reports things are about the "same" as when he last met with this provider in December. Reports he ran out of Synthroid and hasn't taken in about a week. Continues to take Zoloft and Abilify although notes he often misses doses. Reviewed difficulties with medication adherence; patient has difficulty identifying reasons for inconsistent adherence. Denies trouble remembering to take medications and states he gets "tired" of taking them.   Describes mood as "the same, in the middle." Reports easy irritability and getting short with others. Denies any physical altercations. Denies SI/HI. Reports often feeling anxious; typically worries about "things I can't control." Reports he treats anxiety by smoking weed. Smokes 2-3 grams most days. Reports that weed does seem to impact focus and memory. Denies paranoia, AVH.    Not working which he attributes to social anxiety. Has thought about remote jobs but hasn't taken any steps towards this; feels he wouldn't be good at a job. Typically spends his days at home or with friends. Enjoys playing video games.  Again reviewed importance of adherence to medications in order to assess response. Discussed ability to trial alternative medication however patient feels he has not allowed therapeutic trial of current medications and would like to continue current regimen. When asked  what will be different to facilitate  adherence, he says "I'm just going to take them." Discussed that over last few visits, patient has endorsed similar difficulties with adherence and that if adherence issues continue at next visit, we will need to consider alternative strategies.   Extensively reviewed importance of adherence to Synthroid.   Discussed cannabis use and recommendation to reduce use. Reports it is difficult because friends smoke too. He smokes both alone and with friends. He feels it would be helpful to implement routine in his day. Reports he used to play basketball with friends and spend more time outside. Set goal to schedule a walk around the neighborhood into his day at 11AM for 40 min each day.  Patient identifies hope that therapy will be helpful for mood/anxiety and identifies benefit from therapy in the past.  Visit Diagnosis:    ICD-10-CM   1. Substance induced mood disorder (HCC)  F19.94     2. Generalized anxiety disorder  F41.1     3. Cannabis use disorder  F12.90       Past Psychiatric History:  Diagnoses: GAD, MDD vs. substance induced mood disorder, cannabis use Medication trials: Prozac, Celexa, Lexapro, Buspar, Xanax Hospitalizations:  yes - May 2019 for depression and passive SI Suicide attempts: denies Substance use:   -- Cannabis: 2-3 grams daily; last use yesterday  -- Denies other illicit drug use  -- Etoh: denies  -- Tobacco: 2 cigarettes a day; does not smoke every day  Past Medical History:  Past Medical History:  Diagnosis Date   Anxiety    Clotting disorder (HCC)    Depression    GERD (gastroesophageal reflux disease)    Graves disease 08/18/2015   Hyperlipidemia    Hypertension    Hypothyroid    Palpitations    PSC (primary sclerosing cholangitis)    Substance abuse (HCC)    marijuana   Thrombosis    superior mesenteric vein - 2019   Vitamin D deficiency     Past Surgical History:  Procedure Laterality Date   ANTERIOR  CRUCIATE LIGAMENT REPAIR  2012   CHOLECYSTECTOMY  2022   ERCP N/A 08/19/2018   Procedure: ENDOSCOPIC RETROGRADE CHOLANGIOPANCREATOGRAPHY (ERCP);  Surgeon: Rachael Fee, MD;  Location: [facility];  Service: Endoscopy;  Laterality: N/A;   LAPAROSCOPIC APPENDECTOMY N/A 05/11/2020   Procedure: APPENDECTOMY LAPAROSCOPIC;  Surgeon: Luretha Murphy, MD;  Location: [facility];  Service: General;  Laterality: N/A;   LYSIS OF ADHESION N/A 02/15/2022   Procedure: ROBOTIC LYSIS OF ADHESIONS;  Surgeon: Karie Soda, MD;  Location: [facility];  Service: General;  Laterality: N/A;   REMOVAL OF STONES  08/19/2018   Procedure: REMOVAL OF STONES;  Surgeon: Rachael Fee, MD;  Location: [facility];  Service: Endoscopy;;   SPHINCTEROTOMY  08/19/2018   Procedure: Dennison Mascot;  Surgeon: Rachael Fee, MD;  Location: [facility];  Service: Endoscopy;;   WISDOM TOOTH EXTRACTION     age 22   XI ROBOTIC LAPAROSCOPIC ASSISTED APPENDECTOMY N/A 02/15/2022   Procedure: XI ROBOTIC LAPAROSCOPIC ASSISTED APPENDECTOMY PARTIAL CECECTOMY;  Surgeon: Karie Soda, MD;  Location: [facility];  Service: General;  Laterality: N/A;    Family Psychiatric History: denies  Family History:  Family History  Problem Relation Age of Onset   Diabetes Mother    Stomach cancer Maternal Grandfather    Diabetes Maternal Grandfather     Social History:  Social History   Socioeconomic History   Marital status: Single    Spouse name: Not on file   Number of  children: 0   Years of education: Not on file   Highest education level: Not on file  Occupational History   Occupation: unemployed  Tobacco Use   Smoking status: Some Days    Packs/day: 0.25    Years: 2.00    Additional pack years: 0.00    Total pack years: 0.50    Types: Cigarettes   Smokeless tobacco: Never   Tobacco comments:    Marijuana  Vaping Use   Vaping Use: Never used  Substance and Sexual Activity   Alcohol use: No   Drug use: Yes    Frequency: 7.0  times per week    Types: Marijuana    Comment: 2-3 grams daily   Sexual activity: Yes  Other Topics Concern   Not on file  Social History Narrative   Not on file   Social Determinants of Health   Financial Resource Strain: Not on file  Food Insecurity: Not on file  Transportation Needs: Not on file  Physical Activity: Not on file  Stress: Not on file  Social Connections: Not on file    Allergies:  Allergies  Allergen Reactions   Tylenol [Acetaminophen] Other (See Comments)    r/t elevated LFTs     Current Medications: Current Outpatient Medications  Medication Sig Dispense Refill   ARIPiprazole (ABILIFY) 10 MG tablet TAKE ONE TABLET BY MOUTH DAILY 60 tablet 3   sertraline (ZOLOFT) 100 MG tablet TAKE 1 AND 1/2 TABLET BY MOUTH DAILY 135 tablet 3   apixaban (ELIQUIS) 5 MG TABS tablet TAKE 1 TABLET(5 MG) BY MOUTH TWICE DAILY 180 tablet 0   bisoprolol-hydrochlorothiazide (ZIAC) 10-6.25 MG tablet Take 1 tablet Daily for BP - Last Refill . Chart closed . 30 tablet 0   Cholecalciferol (VITAMIN D3) 5000 units CAPS Take 1 capsule (5,000 Units total) by mouth every other day. (Patient taking differently: Take 5,000 Units by mouth daily.)     ezetimibe (ZETIA) 10 MG tablet Take  1 tablet  Daily  for Cholesterol 90 tablet 1   levothyroxine (SYNTHROID) 100 MCG tablet Take 1 tablet   Daily   on an empty stomach with only water for 30 minutes & no Antacid meds, Calcium or Magnesium for 4 hours & avoid Biotin 90 tablet 1   Multiple Vitamins-Minerals (MENS MULTIVITAMIN) TABS Take 1 tablet by mouth daily.     omeprazole (PRILOSEC) 40 MG capsule Take 1 capsule (40 mg total) by mouth daily. You are overdue for an office visit. Please schedule an appointment for further refills. Thank you 30 capsule 0   oxyCODONE (OXY IR/ROXICODONE) 5 MG immediate release tablet Take 1 tablet (5 mg total) by mouth every 6 (six) hours as needed for moderate pain, severe pain or breakthrough pain. 20 tablet 0    rosuvastatin (CRESTOR) 10 MG tablet Take 1 tablet Daily for Cholesterol 90 tablet 1   tadalafil (CIALIS) 20 MG tablet Take  1/2 to 1 tablet  every 2 to 3 days  as needed for XXXX 30 tablet 0   No current facility-administered medications for this visit.    ROS: Does not endorse any physical complaints  Objective:  Psychiatric Specialty Exam: There were no vitals taken for this visit.There is no height or weight on file to calculate BMI.  General Appearance: Casual and Fairly Groomed  Eye Contact:  Fair  Speech:  Clear and Coherent; slowed rate and decreased variation in tone  Volume:  Normal  Mood:   "the same"  Affect:  Euthymic; constricted  Thought Content:  Denies AVH, IOR, paranoia    Suicidal Thoughts:  No  Homicidal Thoughts:  No  Thought Process:  Overall Linear however vague at times  Orientation:  Full (Time, Place, and Person)    Memory:   Grossly intact however has difficulty recalling details of medication regimen and has to verify by looking at bottles in cabinet  Judgment:  Impaired  Insight:   Impaired  Concentration:  Concentration: Poor  Recall:  NA  Fund of Knowledge: Good  Language: Good  Psychomotor Activity:  Normal  Akathisia:  No  AIMS (if indicated): not done  Assets:  Communication Skills Desire for Improvement Housing Leisure Time Social Support Transportation  ADL's:  Intact  Cognition: WNL  Sleep:  Fair   PE: General: sits comfortably in view of camera; no acute distress  Pulm: no increased work of breathing on room air  MSK: all extremity movements appear intact  Neuro: no focal neurological deficits observed  Gait & Station: unable to assess by video    Metabolic Disorder Labs: Lab Results  Component Value Date   HGBA1C 5.9 (H) 11/25/2021   MPG 123 11/25/2021   MPG 117 05/05/2020   No results found for: "PROLACTIN" Lab Results  Component Value Date   CHOL 221 (H) 11/25/2021   TRIG 232 (H) 11/25/2021   HDL 53 11/25/2021    CHOLHDL 4.2 11/25/2021   VLDL 21 04/09/2017   LDLCALC 131 (H) 11/25/2021   LDLCALC 168 (H) 05/05/2020   Lab Results  Component Value Date   TSH 79.34 (H) 11/25/2021   TSH 98.31 (H) 05/05/2021    Therapeutic Level Labs: No results found for: "LITHIUM" No results found for: "VALPROATE" No results found for: "CBMZ"  Screenings:  AIMS    Flowsheet Row [facility]  AIMS Total Score 0      AUDIT    Flowsheet Row [facility]  Alcohol Use Disorder Identification Test Final Score (AUDIT) 0      GAD-7    Flowsheet Row Video Visit from 10/05/2021 in [facility] Video Visit from 07/14/2021 in [facility] Video Visit from 04/14/2021 in [facility] Office Visit from 10/04/2020 in [facility]  Total GAD-7 Score 18 17 21 21       PHQ2-9    Flowsheet Row Video Visit from 10/05/2021 in [facility] Video Visit from 07/14/2021 in [facility] Office Visit from 05/05/2021 in [facility] Video Visit from 04/14/2021 in [facility] Office Visit from 10/04/2020 in [facility]  PHQ-2 Total Score 3 6 0 6 6  PHQ-9 Total Score 14 18 -- 23 21      Flowsheet Row Admission (Discharged) from 02/15/2022 in [facility] [place] [facility] to Hosp-Admission (Discharged) from 12/15/2021 in [facility] Video Visit from 07/14/2021 in [facility]  C-SSRS RISK CATEGORY No Risk No Risk Error: Q7 should not be populated when Q6 is No       Collaboration of Care: Collaboration of Care: Medication Management AEB ongoing medication management,  Psychiatrist AEB established with this provider, and Referral or follow-up with counselor/therapist AEB patient to follow-up with individual therapist  Patient/Guardian was advised Release of Information  must be obtained prior to any record release in order to collaborate their care with an outside provider. Patient/Guardian was advised if they have not already done so to contact the registration department to sign all necessary forms in order for [place] to release information regarding their care.   Consent: Patient/Guardian gives verbal consent for treatment and assignment of benefits for services provided during this visit. Patient/Guardian expressed understanding and agreed to proceed.   Televisit via video: I connected with patient on 03/21/23 at  1:00 PM EDT by a video enabled telemedicine application and verified that I am speaking with the correct person using two identifiers.  Location: Patient: Home address in [place] Provider: remote office in [place]   I discussed the limitations of evaluation and management by telemedicine and the availability of in person appointments. The patient expressed understanding and agreed to proceed.  I discussed the assessment and treatment plan with the patient. The patient was provided an opportunity to ask questions and all were answered. The patient agreed with the plan and demonstrated an understanding of the instructions.   The patient was advised to call back or seek an in-person evaluation if the symptoms worsen or if the condition fails to improve as anticipated.  I provided 40 minutes of non-face-to-face time during this encounter.  Heriberto Stmartin A  03/21/2023, 1:43 PM

## 2023-03-21 ENCOUNTER — Telehealth (HOSPITAL_COMMUNITY): Payer: Medicare HMO | Admitting: Psychiatry

## 2023-03-21 ENCOUNTER — Encounter (HOSPITAL_COMMUNITY): Payer: Self-pay | Admitting: Psychiatry

## 2023-03-21 DIAGNOSIS — F1290 Cannabis use, unspecified, uncomplicated: Secondary | ICD-10-CM | POA: Diagnosis not present

## 2023-03-21 DIAGNOSIS — F1994 Other psychoactive substance use, unspecified with psychoactive substance-induced mood disorder: Secondary | ICD-10-CM

## 2023-03-21 DIAGNOSIS — F411 Generalized anxiety disorder: Secondary | ICD-10-CM

## 2023-03-21 MED ORDER — SERTRALINE HCL 100 MG PO TABS: 150.0000 mg | ORAL_TABLET | Freq: Every day | ORAL | 2 refills | Status: DC

## 2023-03-21 NOTE — Patient Instructions (Signed)
Thank you for attending your appointment today.  -- We did not make any medication changes today. Please continue medications as prescribed.  Please do not make any changes to medications without first discussing with your provider. If you are experiencing a psychiatric emergency, please call 911 or present to your nearest emergency department. Additional crisis, medication management, and therapy resources are included below.  [facility]  [address], [place], [place] [address] [phone] WALK-IN URGENT CARE 24/7 FOR ANYONE [address], [place], [place]  [phone] Fax: [phone] [email or web address] *Interpreters available *Accepts all insurance and uninsured for Urgent Care needs *Accepts Medicaid and uninsured for outpatient treatment (below)      ONLY FOR [place] Residents  Below:    Outpatient New Patient Assessment/Therapy Walk-ins:        Monday -Thursday 8am until slots are full.        Every Friday 1pm-4pm  (first come, first served)                   New Patient Psychiatry/Medication Management        Monday-Friday 8am-11am (first come, first served)               For all walk-ins we ask that you arrive by 7:15am, because patients will be seen in the order of arrival.   

## 2023-04-03 ENCOUNTER — Ambulatory Visit (HOSPITAL_COMMUNITY): Payer: Medicare HMO | Admitting: Mental Health

## 2023-04-03 ENCOUNTER — Encounter (HOSPITAL_COMMUNITY): Payer: Self-pay

## 2023-04-18 ENCOUNTER — Other Ambulatory Visit: Payer: Self-pay | Admitting: Gastroenterology

## 2023-04-18 DIAGNOSIS — K219 Gastro-esophageal reflux disease without esophagitis: Secondary | ICD-10-CM

## 2023-05-19 NOTE — Progress Notes (Signed)
MD Outpatient Progress Note  05/21/2023 11:26 AM Kyle Gonzales  MRN:  [id number]  Assessment:  Kyle Gonzales presents for follow-up evaluation. Today, 05/21/23, patient reports mild improvement in mood associated with initial steps of behavioral activation although continues to experience low energy/motivation related to daily cannabis use. Endorses continued inconsistent medication adherence; explored whether change in regimen or consolidation of medications may promote adherence however he declines any changes today. He opts to focus on starting psychotherapy to promote behavioral activation and medication adherence.   Plan to RTC in 3 months by video.  Identifying Information: Kyle Gonzales is a 32 y.o. male with a history of GAD, MDD vs. 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   Plan:  # MDD vs. Substance induced mood disorder  GAD Past medication trials: Prozac, Celexa, Lexapro, Buspar, Xanax Status of problem: chronic Interventions: -- Continue Abilify 10 mg daily -- Continue Zoloft 150 mg daily  -- Continue Atarax 50 mg BID PRN anxiety **Expressed importance of daily adherence to psychiatric medications -- Patient is scheduled for individual psychotherapy with Kyle Gonzales [facility] on 05/23/23; reminded of this appointment and provided with clinic address  # Cannabis use disorder Past medication trials: none Status of problem: acute; patient contemplative Interventions: -- Continue to provide psychoeducation and promote reduction/cessation of use  # Medication monitoring Interventions: -- Lipid profile revealing for elevated CH, LDL, TG (11/25/21)  and Hgb A1c of 6.0 (prediabetes) (08/10/22); followed by PCP  Patient was given contact information for behavioral health clinic and was instructed to call 911 for emergencies.   Subjective:  Chief Complaint:  Chief Complaint  Patient presents with   Medication Management   Interval History:   Kyle Gonzales reports he has been "alright" although nothing too new. States he has been going to the gym more with friends and feeling "a little better." Reports he has continued to have trouble taking medications every day; taking 3-4 times per week. Takes Synthroid a little bit more, about 5 days out of the week. Dislikes that medications have to be taken every day. Does feel that when he takes medications, they work well.   Continues to smoke cannabis and feels it has negative impact on mood and motivation. Continues to use as it has become a habit and experiences withdrawal when he stops.    Denies SI, HI. Sleeping better as he has stopped sleeping on the couch and is back to sleeping in his room.   Discussed role that ongoing cannabis use and nonadherence to medications especially Synthroid have on mood symptoms. Patient identifies he needs to take medications consistently in order to see if they will work. Discussed that medication nonadherence has been persistent issue and again explored strategies for promoting adherence. Discussed option to make changes to regimen to ascertain if different medications may better improve mood and motivation however he declines and prefers to remain on current regimen. Reminded of upcoming therapy appointment this week and made aware of clinic address; he expresses desire to start therapy and intent to attend.   Visit Diagnosis:    ICD-10-CM   1. Substance induced mood disorder (HCC)  F19.94 ARIPiprazole (ABILIFY) 10 MG tablet    sertraline (ZOLOFT) 100 MG tablet    2. Generalized anxiety disorder  F41.1 sertraline (ZOLOFT) 100 MG tablet    3. Cannabis use disorder   F12.90       Past Psychiatric History:  Diagnoses: GAD, MDD vs. substance induced mood disorder, cannabis use Medication trials: Prozac, Celexa, Lexapro, Buspar, Xanax Hospitalizations:  yes - May 2019 for depression and passive SI Suicide attempts: denies Substance use:   -- Cannabis: 2-3 grams daily; last use yesterday  -- Denies other illicit drug use  -- Etoh: denies  -- Tobacco: 2 cigarettes a day; does not smoke every day  Past Medical History:  Past Medical History:  Diagnosis Date   Anxiety    Clotting disorder (HCC)    Depression    GERD (gastroesophageal reflux disease)    Graves disease 08/18/2015   Hyperlipidemia    Hypertension    Hypothyroid    Palpitations    PSC (primary sclerosing cholangitis)    Substance abuse (HCC)    marijuana   Thrombosis    superior mesenteric vein - 2019   Vitamin D deficiency     Past Surgical History:  Procedure Laterality Date   ANTERIOR CRUCIATE LIGAMENT REPAIR  2012   CHOLECYSTECTOMY  2022   ERCP N/A 08/19/2018   Procedure: ENDOSCOPIC RETROGRADE CHOLANGIOPANCREATOGRAPHY (ERCP);  Surgeon: Rachael Fee, MD;  Location: [facility];  Service: Endoscopy;  Laterality: N/A;   LAPAROSCOPIC APPENDECTOMY N/A 05/11/2020   Procedure: APPENDECTOMY LAPAROSCOPIC;  Surgeon: Luretha Murphy, MD;  Location: [facility];  Service: General;  Laterality: N/A;   LYSIS OF ADHESION N/A 02/15/2022   Procedure: ROBOTIC LYSIS OF ADHESIONS;  Surgeon: Karie Soda, MD;  Location: [facility];  Service: General;  Laterality: N/A;   REMOVAL OF STONES  08/19/2018   Procedure: REMOVAL OF STONES;  Surgeon: Rachael Fee, MD;  Location: [facility];  Service: Endoscopy;;   SPHINCTEROTOMY  08/19/2018   Procedure: Dennison Mascot;  Surgeon: Rachael Fee, MD;  Location: [facility];  Service: Endoscopy;;   WISDOM TOOTH EXTRACTION     age 32   XI ROBOTIC LAPAROSCOPIC ASSISTED APPENDECTOMY N/A 02/15/2022   Procedure: XI ROBOTIC LAPAROSCOPIC ASSISTED  APPENDECTOMY PARTIAL CECECTOMY;  Surgeon: Karie Soda, MD;  Location: [facility];  Service: General;  Laterality: N/A;    Family Psychiatric History: denies  Family History:  Family History  Problem Relation Age of Onset   Diabetes Mother    Stomach cancer Maternal Grandfather    Diabetes Maternal Grandfather     Social History:  Social History   Socioeconomic History   Marital status: Single    Spouse name: Not on file   Number of children: 0   Years of education: Not on file   Highest education level: Not on file  Occupational History   Occupation: unemployed  Tobacco Use   Smoking status: Some Days    Current packs/day: 0.25    Average packs/day: 0.3 packs/day for 2.0 years (0.5 ttl pk-yrs)    Types: Cigarettes   Smokeless tobacco: Never   Tobacco comments:    Marijuana  Vaping Use   Vaping status: Never Used  Substance and Sexual Activity   Alcohol use: No   Drug use: Yes    Frequency: 7.0 times per week    Types: Marijuana    Comment: 2-3 grams daily   Sexual activity: Yes  Other Topics Concern   Not on file  Social History Narrative   Not on file   Social Determinants of Health   Financial Resource Strain: Not on file  Food Insecurity: Medium  Risk (12/28/2022)   [facility]   Food vital sign    Within the past 12 months, you worried that your food would run out before you got money to buy more: Sometimes true    Within the past 12 months, the food you bought just didn't last and you didn't have money to get more. : Never true  Transportation Needs: Not on file (12/28/2022)  Physical Activity: Not on file  Stress: Not on file  Social Connections: Not on file    Allergies:  Allergies  Allergen Reactions   Tylenol [Acetaminophen] Other (See Comments)    r/t elevated LFTs     Current Medications: Current Outpatient Medications  Medication Sig Dispense Refill   apixaban (ELIQUIS) 5 MG TABS tablet TAKE 1 TABLET(5 MG) BY  MOUTH TWICE DAILY 180 tablet 0   ARIPiprazole (ABILIFY) 10 MG tablet Take 1 tablet (10 mg total) by mouth daily. 30 tablet 3   bisoprolol-hydrochlorothiazide (ZIAC) 10-6.25 MG tablet Take 1 tablet Daily for BP - Last Refill . Chart closed . 30 tablet 0   Cholecalciferol (VITAMIN D3) 5000 units CAPS Take 1 capsule (5,000 Units total) by mouth every other day. (Patient taking differently: Take 5,000 Units by mouth daily.)     ezetimibe (ZETIA) 10 MG tablet Take  1 tablet  Daily  for Cholesterol 90 tablet 1   hydrOXYzine (ATARAX) 50 MG tablet Take 1 tablet (50 mg total) by mouth 2 (two) times daily as needed for anxiety. 60 tablet 3   levothyroxine (SYNTHROID) 100 MCG tablet Take 1 tablet   Daily   on an empty stomach with only water for 30 minutes & no Antacid meds, Calcium or Magnesium for 4 hours & avoid Biotin 90 tablet 1   Multiple Vitamins-Minerals (MENS MULTIVITAMIN) TABS Take 1 tablet by mouth daily.     omeprazole (PRILOSEC) 40 MG capsule TAKE 1 CAPSULE BY MOUTH DAILY **PLEASE SCHEDULE OFFICE VISITS FOR REFILLS** 30 capsule 1   oxyCODONE (OXY IR/ROXICODONE) 5 MG immediate release tablet Take 1 tablet (5 mg total) by mouth every 6 (six) hours as needed for moderate pain, severe pain or breakthrough pain. 20 tablet 0   rosuvastatin (CRESTOR) 10 MG tablet Take 1 tablet Daily for Cholesterol 90 tablet 1   sertraline (ZOLOFT) 100 MG tablet Take 1.5 tablets (150 mg total) by mouth daily. 45 tablet 3   tadalafil (CIALIS) 20 MG tablet Take  1/2 to 1 tablet  every 2 to 3 days  as needed for XXXX 30 tablet 0   No current facility-administered medications for this visit.    ROS: Does not endorse any physical complaints  Objective:  Psychiatric Specialty Exam: There were no vitals taken for this visit.There is no height or weight on file to calculate BMI.  General Appearance: Casual and Fairly Groomed  Eye Contact:  Good  Speech:  Clear and Coherent; slowed rate and decreased variation in tone   Volume:  Normal  Mood:   "a little better"  Affect:   Euthymic; blunted  Thought Content:  Denies AVH, IOR, paranoia    Suicidal Thoughts:  No  Homicidal Thoughts:  No  Thought Process:  Overall Linear however vague at times  Orientation:  Full (Time, Place, and Person)    Memory:   Grossly intact however has difficulty recalling details of medication regimen  Judgment:  Impaired  Insight:   Impaired  Concentration:  Concentration: Poor  Recall:  NA  Fund of Knowledge:  Good  Language: Good  Psychomotor Activity:  Normal  Akathisia:  No  AIMS (if indicated): not done  Assets:  Communication Skills Desire for Improvement Housing Leisure Time Social Support Transportation  ADL's:  Intact  Cognition: WNL  Sleep:  Good   PE: General: sits comfortably in view of camera; no acute distress  Pulm: no increased work of breathing on room air  MSK: all extremity movements appear intact  Neuro: no focal neurological deficits observed  Gait & Station: unable to assess by video    Metabolic Disorder Labs: Lab Results  Component Value Date   HGBA1C 5.9 (H) 11/25/2021   MPG 123 11/25/2021   MPG 117 05/05/2020   No results found for: "PROLACTIN" Lab Results  Component Value Date   CHOL 221 (H) 11/25/2021   TRIG 232 (H) 11/25/2021   HDL 53 11/25/2021   CHOLHDL 4.2 11/25/2021   VLDL 21 04/09/2017   LDLCALC 131 (H) 11/25/2021   LDLCALC 168 (H) 05/05/2020   Lab Results  Component Value Date   TSH 79.34 (H) 11/25/2021   TSH 98.31 (H) 05/05/2021    Therapeutic Level Labs: No results found for: "LITHIUM" No results found for: "VALPROATE" No results found for: "CBMZ"  Screenings:  AIMS    Flowsheet Row [facility]  AIMS Total Score 0      AUDIT    Flowsheet Row [facility]  Alcohol Use Disorder Identification Test  Final Score (AUDIT) 0      GAD-7    Flowsheet Row Video Visit from 10/05/2021 in [facility] Video Visit from 07/14/2021 in [facility] Video Visit from 04/14/2021 in [facility] Office Visit from 10/04/2020 in [facility]  Total GAD-7 Score 18 17 21 21       PHQ2-9    Flowsheet Row Video Visit from 10/05/2021 in [facility] Visit from 07/14/2021 in [facility] Office Visit from 05/05/2021 in [facility] Video Visit from 04/14/2021 in [facility] Office Visit from 10/04/2020 in [facility]  PHQ-2 Total Score 3 6 0 6 6  PHQ-9 Total Score 14 18 -- 23 21      Flowsheet Row Admission (Discharged) from 02/15/2022 in [facility] [place] [facility] to Hosp-Admission (Discharged) from 12/15/2021 in [facility] Video Visit from 07/14/2021 in [facility]  C-SSRS RISK CATEGORY No Risk No Risk Error: Q7 should not be populated when Q6 is No       Collaboration of Care: Collaboration of Care: Medication Management AEB ongoing medication management, Psychiatrist AEB established with this provider, and Referral or follow-up with counselor/therapist AEB patient to follow-up with individual therapist  Patient/Guardian was advised Release of Information must be obtained prior to any record release in order to collaborate their care with an outside provider. Patient/Guardian was advised if they have not already done so to contact the registration department to sign all necessary forms in order for [place] to release information regarding their care.   Consent: Patient/Guardian gives verbal consent for treatment and assignment of benefits for services provided during this visit.  Patient/Guardian expressed understanding and agreed to proceed.   Televisit via video: I connected with patient on 05/21/23 at 10:30 AM  EDT by a video enabled telemedicine application and verified that I am speaking with the correct person using two identifiers.  Location: Patient: parked car; in [place] Provider: remote office in    I discussed the limitations of evaluation and management by telemedicine and the availability of in person appointments. The patient expressed understanding and agreed to proceed.  I discussed the assessment and treatment plan with the patient. The patient was provided an opportunity to ask questions and all were answered. The patient agreed with the plan and demonstrated an understanding of the instructions.   The patient was advised to call back or seek an in-person evaluation if the symptoms worsen or if the condition fails to improve as anticipated.  I provided 30 minutes dedicated to the care of this patient via video on the date of this encounter to include chart review, face-to-face time with the patient, medication management/counseling, documentation.  Jaleia Hanke A  05/21/2023, 11:26 AM

## 2023-05-21 ENCOUNTER — Telehealth (INDEPENDENT_AMBULATORY_CARE_PROVIDER_SITE_OTHER): Payer: Medicare HMO | Admitting: Psychiatry

## 2023-05-21 ENCOUNTER — Encounter (HOSPITAL_COMMUNITY): Payer: Self-pay | Admitting: Psychiatry

## 2023-05-21 DIAGNOSIS — F1994 Other psychoactive substance use, unspecified with psychoactive substance-induced mood disorder: Secondary | ICD-10-CM

## 2023-05-21 DIAGNOSIS — F411 Generalized anxiety disorder: Secondary | ICD-10-CM | POA: Diagnosis not present

## 2023-05-21 DIAGNOSIS — F1290 Cannabis use, unspecified, uncomplicated: Secondary | ICD-10-CM

## 2023-05-21 MED ORDER — HYDROXYZINE HCL 50 MG PO TABS: 50.0000 mg | ORAL_TABLET | Freq: Two times a day (BID) | ORAL | 3 refills | Status: AC | PRN

## 2023-05-21 MED ORDER — SERTRALINE HCL 100 MG PO TABS: 150.0000 mg | ORAL_TABLET | Freq: Every day | ORAL | 3 refills | Status: DC

## 2023-05-21 MED ORDER — ARIPIPRAZOLE 10 MG PO TABS: 10.0000 mg | ORAL_TABLET | Freq: Every day | ORAL | 3 refills | Status: DC

## 2023-05-21 NOTE — Patient Instructions (Signed)
Thank you for attending your appointment today.  -- We did not make any medication changes today. Please continue medications as prescribed.  Please do not make any changes to medications without first discussing with your provider. If you are experiencing a psychiatric emergency, please call 911 or present to your nearest emergency department. Additional crisis, medication management, and therapy resources are included below.  [facility]  [address], [place], [place] [address] [phone] WALK-IN URGENT CARE 24/7 FOR ANYONE [address], [place], [place]  [phone] Fax: [phone] [email or web address] *Interpreters available *Accepts all insurance and uninsured for Urgent Care needs *Accepts Medicaid and uninsured for outpatient treatment (below)      ONLY FOR [place] Residents  Below:    Outpatient New Patient Assessment/Therapy Walk-ins:        Monday -Thursday 8am until slots are full.        Every Friday 1pm-4pm  (first come, first served)                   New Patient Psychiatry/Medication Management        Monday-Friday 8am-11am (first come, first served)               For all walk-ins we ask that you arrive by 7:15am, because patients will be seen in the order of arrival.   

## 2023-05-23 ENCOUNTER — Ambulatory Visit (HOSPITAL_COMMUNITY): Payer: Medicare HMO | Admitting: Mental Health

## 2023-05-23 ENCOUNTER — Encounter (HOSPITAL_COMMUNITY): Payer: Self-pay

## 2023-05-24 ENCOUNTER — Other Ambulatory Visit: Payer: Self-pay | Admitting: Internal Medicine

## 2023-06-28 ENCOUNTER — Telehealth: Payer: Self-pay | Admitting: Gastroenterology

## 2023-06-28 ENCOUNTER — Ambulatory Visit: Payer: Medicare HMO | Admitting: Gastroenterology

## 2023-06-28 NOTE — Telephone Encounter (Signed)
Okay thanks for letting me know.  He has not showed up for 3 of the last 4 appointments, hopefully he can keep his next appointment.  Thanks

## 2023-06-28 NOTE — Telephone Encounter (Signed)
PT called and cancelled appointment for today at 210pm. He is sick. He has rescheduled for November

## 2023-06-28 NOTE — Progress Notes (Deleted)
HPI :  Last seen 77/2943:  32 year old male here for a follow-up visit for small duct PSC.  I have not seen him since January 2020.  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 He's had a complicated course over the past year. 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 Placed on Eliquis at that time. He was readmitted in November of that year with jaundice and underwent MRCP with subsequent ERCP. He had intrahepatic dilation with a stricture noted, brushed and dilated. He also had gallstones noted and was referred to general surgery for cholecystectomy which he underwent. CA 19-9 was elevated at the time of hospitalization. AFP normal. Brushings of the stricture were benign.  He has been established with Annamarie Major and Dr. Hamilton Capri of the hepatology clinic locally.  In 2021 he was admitted to the hospital with a perforated appendix and underwent surgery. He had his last colonoscopy with me in September 2018 which was normal and no evidence of IBD.   More recently he is felt pretty well.  He denies any abdominal pains.  No jaundice.  No pruritus.  He denies any diarrhea.  No blood in his stools.  He is compliant with his Eliquis.  In general he is doing pretty well physically.  He endorses ongoing issues with anxiety and depression which he has been struggling with, he is established with psychiatry.  Last year he did not have insurance but has it for this year.  He most recently had an MRCP with hepatology last month.  This was reviewed with them and he has no dominant stricture that warrants intervention at this time.  He has not had LFTs since July of last year.  He is due for DEXA scan which she has not been able to schedule yet and is also due for follow-up surveillance colonoscopy.  He had some thickening of his colon on prior CT scan in 2021 when he was admitted for his ruptured  appendix.  Again he denies any bowel changes.   Endoscopic history: Colonoscopy 06/25/2017 - normal colon and ileum, no evidence of IBD.       CT scan 06/16/20 - IMPRESSION: 1. There is nonspecific inflammatory changes which appears centered in the low pelvis predominantly upon the cecum and terminal ileum as well as upon what appears to be the base of an appendiceal remnant. 2. Review of the operative report reveals a complex procedure with features of a ruptured appendicitis and of inflamed peritoneal tissue. However, there is a persistent tubular structure which extends from the tip of the cecum in a retrocecal fashion towards the midline pelvis raising the question question whether the appendix was only partially resected as the more distended fluid-filled segment of the appendix is less readily apparent on this examination. 3. Additional likely reactive thickening is seen of the rectosigmoid. Some mild focal narrowing at the rectosigmoid junction could be inflammatory though normal peristalsis would present similarly. Consider visualization. 4. Asymmetric thickening towards the anterior bladder dome with a small tubular structure arising from the anterior midline bladder possibly reflecting a distal urachal remnant with some mucosal hyperemia and surrounding inflammation which could reflect superimposed infection or reactive change. Correlate with urinalysis. 5. Mild heterogeneity of the prostate and seminal vesicles, possibly reactive. 6. 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 7. Stable hypoattenuating foci in the  right lobe liver, previously characterized as hemangiomata on prior MRI.     MRCP 10/22/21: IMPRESSION: 1. There is minimal intrahepatic biliary ductal dilatation, the intrahepatic portion of the common bile duct somewhat irregular and distorted in contour, although not overtly strictured, with effacement of the  common bile duct within the pancreatic head. 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 2. Hypertrophy of the caudate lobe without overt cirrhotic morphology of the liver, this appearance likewise characteristic of primary sclerosing cholangitis. 3. 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 4. Status post cholecystectomy.      32 year old male here for reassessment of following issues:   PSC   History as outlined above.  Diagnosed with PSC, he has had history of biliary stricture relieved with ERCP and balloon dilation with negative brushings.  Follow-up MRCP most recently has not shown any high-grade or dominant strictures.  He is asymptomatic from this at present time.  We discussed PSC in general and risks of progressive disease including cirrhosis, as well as other comorbidities to include osteoporosis, bone fracture, IBD.  He is overdue for basic labs asked him to go to the lab today.  He is agreeable for a DEXA scan to assess for osteoporosis.  He is due for a colonoscopy every 3 to 5 years to assess for presence of IBD given this is quite commonly associated with PSC.  He has no symptoms currently.  We discussed colonoscopy, risks and benefits of that and anesthesia and he wants to proceed.  He will need clearance to hold his Eliquis for 2 days prior to the exam.  He will continue to have imaging yearly of his liver coordinated by hepatology, and will continue to see them as well.  He will continue to follow-up with behavioral health for underlying anxiety depression.   Plan: - lab today - CBC, CMET - DEXA scan - rule out osteoporosis / osteopenia - Colonoscopy - will get approval to hold Eliquis for 2 days - continue to follow up with Hepatology, needs yearly imaging of his liver - follow up with behavioral health for anxiety / depression    Had  appendicitis in 2023  Colonoscopy 02/14/22: Dr. Myrtie Neither -   - The examined portion of the ileum was normal. - Normal mucosa in the entire examined colon. - The entire examined colon is normal on direct and retroflexion views. - Several biopsies were obtained in the sigmoid colon, in the transverse colon and in the ascending colon.   Diagnosis 1. Surgical [P], colon, ascending - UNREMARKABLE COLONIC MUCOSA. - NO ACTIVE INFLAMMATION, CHRONIC CHANGES OR GRANULOMAS. 2. Surgical [P], colon, tranverse - UNREMARKABLE COLONIC MUCOSA. - NO ACTIVE INFLAMMATION, CHRONIC CHANGES OR GRANULOMAS. 3. Surgical [P], colon, sigmoid - UNREMARKABLE COLONIC MUCOSA. - NO ACTIVE INFLAMMATION, CHRONIC CHANGES OR GRANULOMAS.   Repeat colonoscopy in 3-5 years  Seen by HEpatology in March - Dawn Drazek  Problem List Items Addressed This Visit   Primary sclerosing cholangitis - Primary  Patient with [facility] based on radiologic findings. Liver biopsy was stage 0 fibrosis and no abnormal findings.   He has cavernous transformation of the portal vein.  I again reviewed with the patient the natural history and progression of PSC. I explained that most patients have advanced fibrosis at time of diagnosis whereas in his case liver biopsy estimated no fibrosis, that in his situation with no evidence of fibrosis on  biopsy he is very likely to not require transplant any time in the near future. I did discuss that he will need MRI/MRCP annually for cholangiocarcinoma screening. He should also undergo bone density testing which can be done by primary care there is a higher risk of osteoporosis in patients with PSC.   It is important to note that the patient does have a chronic medical condition which will require lifelong follow-up.    Relevant Orders  MRI Abdomen W And WO Contrast  Liver Function Panel (Completed)  Sodium (Completed)  Prothrombin Time (PT) with INR (Completed)  Creatinine (Completed)  Carbohydrate Antigen  (CA) 19-9 (Completed)  Alpha-Fetoprotein (AFP), Tumor Marker (Completed)   Reviewed patient's social determinants of health screening. Provided patient with information for [email or web address]. This information was also printed with their discharge summary.  PLAN: Labs MRCP Follow up in 6 month(s)   MRI liver - 02/20/23: IMPRESSION: Stable findings of sclerosing cholangitis. No evidence of malignancy or other acute findings. Stable small benign hepatic hemangiomas. Chronic portal vein thrombosis with cavernous transformation.     Past Medical History:  Diagnosis Date   Anxiety    Clotting disorder (HCC)    Depression    GERD (gastroesophageal reflux disease)    Graves disease 08/18/2015   Hyperlipidemia    Hypertension    Hypothyroid    Palpitations    PSC (primary sclerosing cholangitis)    Substance abuse (HCC)    marijuana   Thrombosis    superior mesenteric vein - 2019   Vitamin D deficiency      Past Surgical History:  Procedure Laterality Date   ANTERIOR CRUCIATE LIGAMENT REPAIR  2012   CHOLECYSTECTOMY  2022   ERCP N/A 08/19/2018   Procedure: ENDOSCOPIC RETROGRADE CHOLANGIOPANCREATOGRAPHY (ERCP);  Surgeon: Rachael Fee, MD;  Location: [facility];  Service: Endoscopy;  Laterality: N/A;   LAPAROSCOPIC APPENDECTOMY N/A 05/11/2020   Procedure: APPENDECTOMY LAPAROSCOPIC;  Surgeon: Luretha Murphy, MD;  Location: [facility];  Service: General;  Laterality: N/A;   LYSIS OF ADHESION N/A 02/15/2022   Procedure: ROBOTIC LYSIS OF ADHESIONS;  Surgeon: Karie Soda, MD;  Location: [facility];  Service: General;  Laterality: N/A;   REMOVAL OF STONES  08/19/2018   Procedure: REMOVAL OF STONES;  Surgeon: Rachael Fee, MD;  Location: [facility];  Service: Endoscopy;;   SPHINCTEROTOMY  08/19/2018   Procedure: Dennison Mascot;  Surgeon: Rachael Fee, MD;  Location: [facility];  Service: Endoscopy;;   WISDOM TOOTH EXTRACTION     age 13   XI ROBOTIC LAPAROSCOPIC ASSISTED  APPENDECTOMY N/A 02/15/2022   Procedure: XI ROBOTIC LAPAROSCOPIC ASSISTED APPENDECTOMY PARTIAL CECECTOMY;  Surgeon: Karie Soda, MD;  Location: [facility];  Service: General;  Laterality: N/A;   Family History  Problem Relation Age of Onset   Diabetes Mother    Stomach cancer Maternal Grandfather    Diabetes Maternal Grandfather    Social History   Tobacco Use   Smoking status: Some Days    Current packs/day: 0.25    Average packs/day: 0.3 packs/day for 2.0 years (0.5 ttl pk-yrs)    Types: Cigarettes   Smokeless tobacco: Never   Tobacco comments:    Marijuana  Vaping Use   Vaping status: Never Used  Substance Use Topics   Alcohol use: No   Drug use: Yes    Frequency: 7.0 times per week    Types: Marijuana    Comment: 2-3 grams daily   Current Outpatient Medications  Medication Sig Dispense Refill  apixaban (ELIQUIS) 5 MG TABS tablet TAKE 1 TABLET(5 MG) BY MOUTH TWICE DAILY 180 tablet 0   ARIPiprazole (ABILIFY) 10 MG tablet Take 1 tablet (10 mg total) by mouth daily. 30 tablet 3   bisoprolol-hydrochlorothiazide (ZIAC) 10-6.25 MG tablet Take 1 tablet Daily for BP - Last Refill . Chart closed . 30 tablet 0   Cholecalciferol (VITAMIN D3) 5000 units CAPS Take 1 capsule (5,000 Units total) by mouth every other day. (Patient taking differently: Take 5,000 Units by mouth daily.)     ezetimibe (ZETIA) 10 MG tablet Take  1 tablet  Daily  for Cholesterol 90 tablet 1   hydrOXYzine (ATARAX) 50 MG tablet Take 1 tablet (50 mg total) by mouth 2 (two) times daily as needed for anxiety. 60 tablet 3   levothyroxine (SYNTHROID) 100 MCG tablet Take 1 tablet   Daily   on an empty stomach with only water for 30 minutes & no Antacid meds, Calcium or Magnesium for 4 hours & avoid Biotin 90 tablet 1   Multiple Vitamins-Minerals (MENS MULTIVITAMIN) TABS Take 1 tablet by mouth daily.     omeprazole (PRILOSEC) 40 MG capsule TAKE 1 CAPSULE BY MOUTH DAILY **PLEASE SCHEDULE OFFICE VISITS FOR REFILLS** 30 capsule  1   oxyCODONE (OXY IR/ROXICODONE) 5 MG immediate release tablet Take 1 tablet (5 mg total) by mouth every 6 (six) hours as needed for moderate pain, severe pain or breakthrough pain. 20 tablet 0   rosuvastatin (CRESTOR) 10 MG tablet Take 1 tablet Daily for Cholesterol 90 tablet 1   sertraline (ZOLOFT) 100 MG tablet Take 1.5 tablets (150 mg total) by mouth daily. 45 tablet 3   tadalafil (CIALIS) 20 MG tablet Take  1/2 to 1 tablet  every 2 to 3 days  as needed for XXXX 30 tablet 0   No current facility-administered medications for this visit.   Allergies  Allergen Reactions   Tylenol [Acetaminophen] Other (See Comments)    r/t elevated LFTs      Review of Systems: All systems reviewed and negative except where noted in HPI.    No results found.  Physical Exam: There were no vitals taken for this visit. Constitutional: Pleasant,well-developed, ***male in no acute distress. HEENT: Normocephalic and atraumatic. Conjunctivae are normal. No scleral icterus. Neck supple.  Cardiovascular: Normal rate, regular rhythm.  Pulmonary/chest: Effort normal and breath sounds normal. No wheezing, rales or rhonchi. Abdominal: Soft, nondistended, nontender. Bowel sounds active throughout. There are no masses palpable. No hepatomegaly. Extremities: no edema Lymphadenopathy: No cervical adenopathy noted. Neurological: Alert and oriented to person place and time. Skin: Skin is warm and dry. No rashes noted. Psychiatric: Normal mood and affect. Behavior is normal.   ASSESSMENT: 32 y.o. male here for assessment of the following  No diagnosis found.  PLAN:   No ref. provider found

## 2023-08-15 ENCOUNTER — Other Ambulatory Visit (HOSPITAL_COMMUNITY): Payer: Self-pay | Admitting: Psychiatry

## 2023-08-16 NOTE — Progress Notes (Deleted)
MD Outpatient Progress Note  08/16/2023 12:55 PM Kyle Gonzales  MRN:  [id number]  Assessment:  Kyle Gonzales presents for follow-up evaluation. Today, 08/16/23, patient reports   --- mild improvement in mood associated with initial steps of behavioral activation although continues to experience low energy/motivation related to daily cannabis use. Endorses continued inconsistent medication adherence; explored whether change in regimen or consolidation of medications may promote adherence however he declines any changes today. He opts to focus on starting psychotherapy to promote behavioral activation and medication adherence.   Plan to RTC in 3 months by video.  Identifying Information: Kyle Gonzales is a 32 y.o. male with a history of GAD, MDD vs. 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   Plan:  # MDD vs. Substance induced mood disorder  GAD Past medication trials: Prozac, Celexa, Lexapro, Buspar, Xanax Status of problem: chronic Interventions: -- Continue Abilify 10 mg daily -- Continue Zoloft 150 mg daily  -- Continue Atarax 50 mg BID PRN anxiety **Expressed importance of daily adherence to psychiatric medications -- Patient is scheduled for individual psychotherapy with [facility] on 05/23/23; reminded of this appointment and provided with clinic address  # Cannabis use disorder Past medication trials: none Status of problem: acute; patient contemplative Interventions: -- Continue to provide psychoeducation and promote reduction/cessation of use  # Medication monitoring Interventions: -- Lipid profile revealing for  elevated CH, LDL, TG (11/25/21) and Hgb V4Q of 6.2 (prediabetes) (05/22/23); followed by PCP  Patient was given contact information for behavioral health clinic and was instructed to call 911 for emergencies.   Subjective:  Chief Complaint:  No chief complaint on file.  Interval History:   TSH 178 05/22/23 No-show therapy appointment 8/7 meds Cannabis use  Next appt in person   Visit Diagnosis:  No diagnosis found.   Past Psychiatric History:  Diagnoses: GAD, MDD vs. substance induced mood disorder, cannabis use Medication trials: Prozac, Celexa, Lexapro, Buspar, Xanax Hospitalizations:  yes - May 2019 for depression and passive SI Suicide attempts: denies Substance use:   -- Cannabis: 2-3 grams daily; last use yesterday  -- Denies other illicit drug use  -- Etoh: denies  -- Tobacco: 2 cigarettes a day; does not smoke every day  Past Medical History:  Past Medical History:  Diagnosis Date   Anxiety    Clotting disorder (HCC)    Depression    GERD (gastroesophageal reflux disease)    Graves disease 08/18/2015   Hyperlipidemia    Hypertension    Hypothyroid    Palpitations    PSC (primary sclerosing cholangitis)    Substance abuse (HCC)    marijuana   Thrombosis    superior mesenteric vein - 2019   Vitamin D deficiency     Past Surgical History:  Procedure Laterality Date   ANTERIOR CRUCIATE LIGAMENT REPAIR  2012   CHOLECYSTECTOMY  2022   ERCP N/A 08/19/2018   Procedure: ENDOSCOPIC RETROGRADE CHOLANGIOPANCREATOGRAPHY (ERCP);  Surgeon: Rachael Fee, MD;  Location: [facility];  Service: Endoscopy;  Laterality: N/A;   LAPAROSCOPIC APPENDECTOMY N/A 05/11/2020   Procedure: APPENDECTOMY LAPAROSCOPIC;  Surgeon: Luretha Murphy, MD;  [facility];  Service: General;  Laterality: N/A;   LYSIS OF ADHESION N/A 02/15/2022  Procedure: ROBOTIC LYSIS OF ADHESIONS;  Surgeon: Karie Soda, MD;  [facility];  Service: General;  Laterality: N/A;   REMOVAL OF STONES   08/19/2018   Procedure: REMOVAL OF STONES;  Surgeon: Rachael Fee, MD;  [facility];  Service: Endoscopy;;   SPHINCTEROTOMY  08/19/2018   Procedure: Dennison Mascot;  Surgeon: Rachael Fee, MD;  [facility];  Service: Endoscopy;;   WISDOM TOOTH EXTRACTION     age 46   XI ROBOTIC LAPAROSCOPIC ASSISTED APPENDECTOMY N/A 02/15/2022   Procedure: XI ROBOTIC LAPAROSCOPIC ASSISTED APPENDECTOMY PARTIAL CECECTOMY;  Surgeon: Karie Soda, MD;  [facility];  Service: General;  Laterality: N/A;    Family Psychiatric History: denies  Family History:  Family History  Problem Relation Age of Onset   Diabetes Mother    Stomach cancer Maternal Grandfather    Diabetes Maternal Grandfather     Social History:  Social History   Socioeconomic History   Marital status: Single    Spouse name: Not on file   Number of children: 0   Years of education: Not on file   Highest education level: Not on file  Occupational History   Occupation: unemployed  Tobacco Use   Smoking status: Some Days    Current packs/day: 0.25    Average packs/day: 0.3 packs/day for 2.0 years (0.5 ttl pk-yrs)    Types: Cigarettes   Smokeless tobacco: Never   Tobacco comments:    Marijuana  Vaping Use   Vaping status: Never Used  Substance and Sexual Activity   Alcohol use: No   Drug use: Yes    Frequency: 7.0 times per week    Types: Marijuana    Comment: 2-3 grams daily   Sexual activity: Yes  Other Topics Concern   Not on file  Social History Narrative   Not on file   Social Determinants of Health   Financial Resource Strain: Not on file  Food Insecurity: Medium Risk (12/28/2022)   [facility]   Hunger Vital Sign    Worried About Running Out of Food in the Last Year: Sometimes true    Ran Out of Food in the Last Year: Never true  Transportation Needs: Not on file (12/28/2022)  Physical Activity: Not on file  Stress: Not on file  Social  Connections: Not on file    Allergies:  Allergies  Allergen Reactions   Tylenol [Acetaminophen] Other (See Comments)    r/t elevated LFTs     Current Medications: Current Outpatient Medications  Medication Sig Dispense Refill   apixaban (ELIQUIS) 5 MG TABS tablet TAKE 1 TABLET(5 MG) BY MOUTH TWICE DAILY 180 tablet 0   ARIPiprazole (ABILIFY) 10 MG tablet Take 1 tablet (10 mg total) by mouth daily. 30 tablet 3   bisoprolol-hydrochlorothiazide (ZIAC) 10-6.25 MG tablet Take 1 tablet Daily for BP - Last Refill . Chart closed . 30 tablet 0   Cholecalciferol (VITAMIN D3) 5000 units CAPS Take 1 capsule (5,000 Units total) by mouth every other day. (Patient taking differently: Take 5,000 Units by mouth daily.)     ezetimibe (ZETIA) 10 MG tablet Take  1 tablet  Daily  for Cholesterol 90 tablet 1   hydrOXYzine (ATARAX) 50 MG tablet Take 1 tablet (50 mg total) by mouth 2 (two) times daily as needed for anxiety. 60 tablet 3   levothyroxine (SYNTHROID) 100 MCG tablet Take 1 tablet   Daily   on an empty stomach with only water for 30  minutes & no Antacid meds, Calcium or Magnesium for 4 hours & avoid Biotin 90 tablet 1   Multiple Vitamins-Minerals (MENS MULTIVITAMIN) TABS Take 1 tablet by mouth daily.     omeprazole (PRILOSEC) 40 MG capsule TAKE 1 CAPSULE BY MOUTH DAILY **PLEASE SCHEDULE OFFICE VISITS FOR REFILLS** 30 capsule 1   oxyCODONE (OXY IR/ROXICODONE) 5 MG immediate release tablet Take 1 tablet (5 mg total) by mouth every 6 (six) hours as needed for moderate pain, severe pain or breakthrough pain. 20 tablet 0   rosuvastatin (CRESTOR) 10 MG tablet Take 1 tablet Daily for Cholesterol 90 tablet 1   sertraline (ZOLOFT) 100 MG tablet Take 1.5 tablets (150 mg total) by mouth daily. 45 tablet 3   tadalafil (CIALIS) 20 MG tablet Take  1/2 to 1 tablet  every 2 to 3 days  as needed for XXXX 30 tablet 0   No current facility-administered medications for this visit.    ROS: Does not endorse any physical  complaints  Objective:  Psychiatric Specialty Exam: There were no vitals taken for this visit.There is no height or weight on file to calculate BMI.  General Appearance: Casual and Fairly Groomed  Eye Contact:  Good  Speech:  Clear and Coherent; slowed rate and decreased variation in tone  Volume:  Normal  Mood:   "a little better"  Affect:   Euthymic; blunted  Thought Content:  Denies AVH, IOR, paranoia    Suicidal Thoughts:  No  Homicidal Thoughts:  No  Thought Process:  Overall Linear however vague at times  Orientation:  Full (Time, Place, and Person)    Memory:   Grossly intact however has difficulty recalling details of medication regimen  Judgment:  Impaired  Insight:   Impaired  Concentration:  Concentration: Poor  Recall:  NA  Fund of Knowledge: Good  Language: Good  Psychomotor Activity:  Normal  Akathisia:  No  AIMS (if indicated): not done  Assets:  Communication Skills Desire for Improvement Housing Leisure Time Social Support Transportation  ADL's:  Intact  Cognition: WNL  Sleep:  Good   PE: General: sits comfortably in view of camera; no acute distress  Pulm: no increased work of breathing on room air  MSK: all extremity movements appear intact  Neuro: no focal neurological deficits observed  Gait & Station: unable to assess by video    Metabolic Disorder Labs: Lab Results  Component Value Date   HGBA1C 5.9 (H) 11/25/2021   MPG 123 11/25/2021   MPG 117 05/05/2020   No results found for: "PROLACTIN" Lab Results  Component Value Date   CHOL 221 (H) 11/25/2021   TRIG 232 (H) 11/25/2021   HDL 53 11/25/2021   CHOLHDL 4.2 11/25/2021   VLDL 21 04/09/2017   LDLCALC 131 (H) 11/25/2021   LDLCALC 168 (H) 05/05/2020   Lab Results  Component Value Date   TSH 79.34 (H) 11/25/2021   TSH 98.31 (H) 05/05/2021    Therapeutic Level Labs: No results found for: "LITHIUM" No results found for: "VALPROATE" No results found for: "CBMZ"  Screenings:   AIMS    Flowsheet Row [facility]  AIMS Total Score 0      AUDIT    Flowsheet Row [facility]  Alcohol Use Disorder Identification Test Final Score (AUDIT) 0      GAD-7    Flowsheet Row Video Visit from 10/05/2021 in [facility] Video Visit from 07/14/2021 in [facility] Video Visit from 04/14/2021 in [facility] Office Visit from 10/04/2020 in [facility]  Total GAD-7 Score 18 17 21 21       PHQ2-9    Flowsheet Row Video Visit from 10/05/2021 in [facility] Video Visit from 07/14/2021 in [facility] Office Visit from 05/05/2021 in [facility] Video Visit from 04/14/2021 in [facility] Office Visit from 10/04/2020 in [facility]  PHQ-2 Total Score 3 6 0 6 6  PHQ-9 Total Score 14 18 -- 23 21      Flowsheet Row Admission (Discharged) from 02/15/2022 in [facility] [place] [facility] to Hosp-Admission (Discharged) from 12/15/2021 in [facility] Video Visit from 07/14/2021 in [facility]  C-SSRS RISK CATEGORY No Risk No Risk Error: Q7 should not be populated when Q6 is No       Collaboration of Care: Collaboration of Care: Medication Management AEB ongoing medication management, Psychiatrist AEB established with this provider, and Referral or follow-up with counselor/therapist AEB patient to follow-up with individual therapist  Patient/Guardian was advised Release of Information must be obtained prior to any record release in order to collaborate their care with an outside provider. Patient/Guardian was advised if  they have not already done so to contact the registration department to sign all necessary forms in order for [place] to release information regarding their care.   Consent: Patient/Guardian gives verbal consent for treatment and assignment of benefits for services provided during this visit. Patient/Guardian expressed understanding and agreed to proceed.   Televisit via video: I connected with patient on 08/16/23 at 11:00 AM EST by a video enabled telemedicine application and verified that I am speaking with the correct person using two identifiers.  Location: Patient: *** Provider: remote office in [place]   I discussed the limitations of evaluation and management by telemedicine and the availability of in person appointments. The patient expressed understanding and agreed to proceed.  I discussed the assessment and treatment plan with the patient. The patient was provided an opportunity to ask questions and all were answered. The patient agreed with the plan and demonstrated an understanding of the instructions.   The patient was advised to call back or seek an in-person evaluation if the symptoms worsen or if the condition fails to improve as anticipated.  I provided *** minutes dedicated to the care of this patient via video on the date of this encounter to include chart review, face-to-face time with the patient, medication management/counseling, documentation.  Kaysea Raya A Kimbely Whiteaker 08/16/2023, 12:55 PM

## 2023-08-20 ENCOUNTER — Encounter (HOSPITAL_COMMUNITY): Payer: Self-pay

## 2023-08-20 ENCOUNTER — Telehealth (HOSPITAL_COMMUNITY): Payer: Medicare HMO | Admitting: Psychiatry

## 2023-09-05 NOTE — Progress Notes (Signed)
Patient did not connect for virtual psychiatric medication management appointment on 09/07/23 at 10:30AM. Sent secure video link with no response. Called phone x2 with no answer; unable to leave VM.  Daine Gip, MD 09/07/23

## 2023-09-07 ENCOUNTER — Encounter (HOSPITAL_COMMUNITY): Payer: Medicare HMO | Admitting: Psychiatry

## 2023-09-07 ENCOUNTER — Encounter (HOSPITAL_COMMUNITY): Payer: Self-pay

## 2023-09-11 ENCOUNTER — Ambulatory Visit: Payer: Medicare HMO | Admitting: Nurse Practitioner

## 2023-10-18 ENCOUNTER — Telehealth: Payer: Self-pay | Admitting: Psychiatry

## 2023-10-18 ENCOUNTER — Other Ambulatory Visit (HOSPITAL_COMMUNITY): Payer: Self-pay | Admitting: Psychiatry

## 2023-10-18 DIAGNOSIS — F1994 Other psychoactive substance use, unspecified with psychoactive substance-induced mood disorder: Secondary | ICD-10-CM

## 2023-10-18 DIAGNOSIS — F411 Generalized anxiety disorder: Secondary | ICD-10-CM

## 2023-10-18 NOTE — Telephone Encounter (Signed)
 I understand. Could you notify the patient about the above so that he is aware of the available resources (if it has not been done yet)? I will CC Dr. Josephina Shih to keep her informed as well.

## 2023-10-18 NOTE — Telephone Encounter (Signed)
 I will defer further guidance to Dr. Josephina Shih.

## 2023-10-18 NOTE — Telephone Encounter (Signed)
 The refill has been ordered as requested. The patient missed their appointment with Dr. Josephina Shih. Could you please contact the patient to schedule a follow-up appointment with her? Thanks.

## 2023-10-19 ENCOUNTER — Encounter (HOSPITAL_COMMUNITY): Payer: Self-pay

## 2023-11-29 ENCOUNTER — Other Ambulatory Visit: Payer: 59

## 2023-11-29 ENCOUNTER — Ambulatory Visit: Payer: 59 | Admitting: Nurse Practitioner

## 2023-11-29 ENCOUNTER — Encounter: Payer: Self-pay | Admitting: Nurse Practitioner

## 2023-11-29 VITALS — BP 120/78 | HR 116 | Ht 74.0 in | Wt 284.0 lb

## 2023-11-29 DIAGNOSIS — Z86718 Personal history of other venous thrombosis and embolism: Secondary | ICD-10-CM

## 2023-11-29 DIAGNOSIS — K8301 Primary sclerosing cholangitis: Secondary | ICD-10-CM | POA: Diagnosis not present

## 2023-11-29 DIAGNOSIS — Z7901 Long term (current) use of anticoagulants: Secondary | ICD-10-CM | POA: Diagnosis not present

## 2023-11-29 DIAGNOSIS — K219 Gastro-esophageal reflux disease without esophagitis: Secondary | ICD-10-CM

## 2023-11-29 LAB — COMPREHENSIVE METABOLIC PANEL
ALT: 37 U/L (ref 0–53)
AST: 29 U/L (ref 0–37)
Albumin: 5 g/dL (ref 3.5–5.2)
Alkaline Phosphatase: 43 U/L (ref 39–117)
BUN: 11 mg/dL (ref 6–23)
CO2: 27 meq/L (ref 19–32)
Calcium: 9.6 mg/dL (ref 8.4–10.5)
Chloride: 103 meq/L (ref 96–112)
Creatinine, Ser: 1.24 mg/dL (ref 0.40–1.50)
GFR: 77.65 mL/min (ref >60.00)
Glucose, Bld: 107 mg/dL — ABNORMAL HIGH (ref 70–99)
Potassium: 3.9 meq/L (ref 3.5–5.1)
Sodium: 138 meq/L (ref 135–145)
Total Bilirubin: 0.7 mg/dL (ref 0.2–1.2)
Total Protein: 8.4 g/dL — ABNORMAL HIGH (ref 6.0–8.3)

## 2023-11-29 LAB — CBC
HCT: 45.1 % (ref 39.0–52.0)
Hemoglobin: 15 g/dL (ref 13.0–17.0)
MCHC: 33.3 g/dL (ref 30.0–36.0)
MCV: 92.5 fL (ref 78.0–100.0)
Platelets: 209 10*3/uL (ref 150.0–400.0)
RBC: 4.87 Mil/uL (ref 4.22–5.81)
RDW: 14.7 % (ref 11.5–15.5)
WBC: 7.3 10*3/uL (ref 4.0–10.5)

## 2023-11-29 LAB — PROTIME-INR
INR: 1.1 {ratio} — ABNORMAL HIGH (ref 0.8–1.0)
Prothrombin Time: 11.6 s (ref 9.6–13.1)

## 2023-11-29 NOTE — Progress Notes (Signed)
ASSESSMENT    Brief Narrative:  33 y.o.  male known to Dr. Adela Lank  with a past medical history not limited to small duct PSC, SMV thrombosis,  HTN, Graves disease s/p ablation with resultant hypothyroidism, cholelithiasis s/p cholecystectomy in 2020, perforated appendix s/p appendectomy and partial cecectomy  2021, depression, daily THC use.   Small duct PSC diagnosed around 2018. No associated UC Stable PSC , chronic PVT with cavernous transformation and  hypertrophied caudate lobe of liver on most recent MRI / MRCP May 2024.  LFTs in November 2024 were normal. Follows with [facility] ( last seen Sept 2024).    History of SMV thrombosis, on Eliquis  See PMH for any additional medical & surgical history   PLAN   --He is up to date on yearly MRI / MRCP ( last one May 2024) . [facility] has already placed order for the next one --Up to date on Dexa scan ( done Feb 2025). Bone mineral density normal for age. Taking daily Vit D  --Will obtain cbc, cmet, inr, CA 19-9  --Up to date on colonoscopy for IBD screening, last one was May 2023. Next colonoscopy due May 2026.  --He should already be scheduled with [facility] around March or April for a 6 month follow up visit but he will call to confirm --Follow up with Dr. Adela Lank in 6 months   HPI   Chief complaint : Follow up on PSC  Brief GI History Kyle Gonzales was diagnosed with PSC around 2018 ( by imaging). Liver biopsy at the time was non-diagnostic and negative for cirrhosis. In 2019 he had a biliary stricture relieved with ERCP and balloon dilation. Biliary sludge / small stones were removed form the CBD during the procedure. Bile duct brushings were negative for malignancy.  In early 2020 he underwent cholecystectomy.  We last saw him in Jan 2023.  He is still being followed by [facility].  Most recent MRI / MRCP May 2024 showing stable mild dilation of intrahepatic bile ducts, irregular contour of intra / extra hepatic bile  duct, marked caudate lobe hypertrophy, chronic portal vein thrombosis with cavernous transformation.  LFTs in November 2024 were normal.   INTERVAL HISTORY Kyle Gonzales is alone today. He is feeling okay. He has no abdominal pain. No N/V. Bowel movements are normal. No blood in stool. He is on Eliquis. No pruritus. He is smoking THC on a daily basis. No Etoh use. He is unsure when next appt with [facility] is ( mother keeps up with his appts)    TSH has been markedly elevated,  followed by PCP.   GI History / Pertinent GI Studies   **All endoscopic studies may not be included here    02/16/2023 MRI ABDOMEN WITHOUT AND WITH CONTRAST  TECHNIQUE:  Multiplanar multisequence MR imaging of the abdomen was performed  both before and after the administration of intravenous contrast.   CONTRAST: 10 mL Vueway   COMPARISON: 10/22/2021   FINDINGS:  Lower chest: No acute findings.   Hepatobiliary: Several small benign hemangiomas in the posterior  right and central left hepatic lobes remains stable. No suspicious  liver masses are identified. Marked caudate lobe hypertrophy is  again seen. Chronic portal vein thrombosis with cavernous  transformation is again seen.   Prior cholecystectomy noted. Mild dilatation of intrahepatic bile  ducts is again seen. Irregular contour of intra and extrahepatic  bile ducts again noted, consistent with history of sclerosing  cholangitis.  Pancreas: No mass or inflammatory changes. No evidence of pancreatic  ductal dilatation.   Spleen: Within normal limits in size and appearance.   Adrenals/Urinary Tract: No suspicious masses identified. No evidence  of hydronephrosis.   Stomach/Bowel: Unremarkable.   Vascular/Lymphatic: No pathologically enlarged lymph nodes  identified. No acute vascular findings.   Other: None.   Musculoskeletal: No suspicious bone lesions identified.   IMPRESSION:  Stable findings of sclerosing cholangitis. No evidence of  malignancy  or other acute findings. Stable small benign hepatic hemangiomas.  Chronic portal vein thrombosis with cavernous transformation.   May 2023 colonoscopy for IBD screening in setting of PSC - The examined portion of the ileum was normal. - Normal mucosa in the entire examined colon. - The entire examined colon is normal on direct and retroflexion views. - Several biopsies were obtained in the sigmoid colon, in the transverse colon and in the ascending colon.   1. Surgical [P], colon, ascending - UNREMARKABLE COLONIC MUCOSA. - NO ACTIVE INFLAMMATION, CHRONIC CHANGES OR GRANULOMAS. 2. Surgical [P], colon, tranverse - UNREMARKABLE COLONIC MUCOSA. - NO ACTIVE INFLAMMATION, CHRONIC CHANGES OR GRANULOMAS. 3. Surgical [P], colon, sigmoid - UNREMARKABLE COLONIC MUCOSA. - NO ACTIVE INFLAMMATION, CHRONIC CHANGES OR GRANULOMAS.   DEXA 11/23/23 IMPRESSION:  Bone mineral density is within expected range for age.    Past Medical History:  Diagnosis Date   Anxiety    Clotting disorder (HCC)    Depression    GERD (gastroesophageal reflux disease)    Graves disease 08/18/2015   Hyperlipidemia    Hypertension    Hypothyroid    Palpitations    PSC (primary sclerosing cholangitis)    Substance abuse (HCC)    marijuana   Thrombosis    superior mesenteric vein - 2019   Vitamin D deficiency     Past Surgical History:  Procedure Laterality Date   ANTERIOR CRUCIATE LIGAMENT REPAIR  2012   CHOLECYSTECTOMY  2022   ERCP N/A 08/19/2018   Procedure: ENDOSCOPIC RETROGRADE CHOLANGIOPANCREATOGRAPHY (ERCP);  Surgeon: Rachael Fee, MD;  Location: [facility];  Service: Endoscopy;  Laterality: N/A;   LAPAROSCOPIC APPENDECTOMY N/A 05/11/2020   Procedure: APPENDECTOMY LAPAROSCOPIC;  Surgeon: Luretha Murphy, MD;  [facility];  Service: General;  Laterality: N/A;   LYSIS OF ADHESION N/A 02/15/2022   Procedure: ROBOTIC LYSIS OF ADHESIONS;  Surgeon: Karie Soda, MD;  [facility];   Service: General;  Laterality: N/A;   REMOVAL OF STONES  08/19/2018   Procedure: REMOVAL OF STONES;  Surgeon: Rachael Fee, MD;  [facility];  Service: Endoscopy;;   SPHINCTEROTOMY  08/19/2018   Procedure: Dennison Mascot;  Surgeon: Rachael Fee, MD;  [facility];  Service: Endoscopy;;   WISDOM TOOTH EXTRACTION     age 52   XI ROBOTIC LAPAROSCOPIC ASSISTED APPENDECTOMY N/A 02/15/2022   Procedure: XI ROBOTIC LAPAROSCOPIC ASSISTED APPENDECTOMY PARTIAL CECECTOMY;  Surgeon: Karie Soda, MD;  [facility];  Service: General;  Laterality: N/A;    Family History  Problem Relation Age of Onset   Diabetes Mother    Stomach cancer Maternal Grandfather    Diabetes Maternal Grandfather     Current Medications, Allergies, Family History and Social History were reviewed in [employer] electronic medical record.     Current Outpatient Medications  Medication Sig Dispense Refill   apixaban (ELIQUIS) 5 MG TABS tablet TAKE 1 TABLET(5 MG) BY MOUTH TWICE DAILY 180 tablet 0   ARIPiprazole (ABILIFY) 10 MG tablet Take 1 tablet (10 mg  total) by mouth daily. 30 tablet 3   bisoprolol-hydrochlorothiazide (ZIAC) 10-6.25 MG tablet Take 1 tablet Daily for BP - Last Refill . Chart closed . 30 tablet 0   Cholecalciferol (VITAMIN D3) 5000 units CAPS Take 1 capsule (5,000 Units total) by mouth every other day. (Patient taking differently: Take 5,000 Units by mouth daily.)     ezetimibe (ZETIA) 10 MG tablet Take  1 tablet  Daily  for Cholesterol 90 tablet 1   levothyroxine (SYNTHROID) 100 MCG tablet Take 1 tablet   Daily   on an empty stomach with only water for 30 minutes & no Antacid meds, Calcium or Magnesium for 4 hours & avoid Biotin 90 tablet 1   Multiple Vitamins-Minerals (MENS MULTIVITAMIN) TABS Take 1 tablet by mouth daily.     omeprazole (PRILOSEC) 40 MG capsule TAKE 1 CAPSULE BY MOUTH DAILY **PLEASE SCHEDULE OFFICE VISITS FOR REFILLS** 30 capsule 1   oxyCODONE (OXY  IR/ROXICODONE) 5 MG immediate release tablet Take 1 tablet (5 mg total) by mouth every 6 (six) hours as needed for moderate pain, severe pain or breakthrough pain. 20 tablet 0   rosuvastatin (CRESTOR) 10 MG tablet Take 1 tablet Daily for Cholesterol 90 tablet 1   sertraline (ZOLOFT) 100 MG tablet Take 1.5 tablets (150 mg total) by mouth daily. 45 tablet 0   tadalafil (CIALIS) 20 MG tablet Take  1/2 to 1 tablet  every 2 to 3 days  as needed for XXXX 30 tablet 0   No current facility-administered medications for this visit.    Review of Systems: No chest pain. No shortness of breath. No urinary complaints.    Physical Exam  There were no vitals filed for this visit. Wt Readings from Last 3 Encounters:  02/16/22 291 lb 7.2 oz (132.2 kg)  02/14/22 273 lb (123.8 kg)  02/03/22 290 lb (131.5 kg)    BP 120/78   Pulse (!) 116   Ht 6\' 2"  (1.88 m)   Wt 284 lb (128.8 kg)   BMI 36.46 kg/m  Constitutional:  Pleasant, generally well appearing male in no acute distress. Psychiatric: Normal mood and affect. Behavior is normal. EENT: Pupils normal.  Conjunctivae are normal. No scleral icterus. Neck supple.  Cardiovascular: Normal rate, regular rhythm.  Pulmonary/chest: Effort normal and breath sounds normal. No wheezing, rales or rhonchi. Abdominal: Soft, nondistended, nontender. Bowel sounds active throughout. There are no masses palpable. No hepatomegaly. Neurological: Alert and oriented to person place and time.    Willette Cluster, NP  11/29/2023, 9:38 AM

## 2023-11-29 NOTE — Progress Notes (Signed)
Agree with assessment and plan as outlined.

## 2023-11-29 NOTE — Patient Instructions (Addendum)
Your provider has requested that you go to the basement level for lab work before leaving today. Press "B" on the elevator. The lab is located at the first door on the left as you exit the elevator.  Due to recent changes in healthcare laws, you may see the results of your imaging and laboratory studies on MyChart before your provider has had a chance to review them.  We understand that in some cases there may be results that are confusing or concerning to you. Not all laboratory results come back in the same time frame and the provider may be waiting for multiple results in order to interpret others.  Please give [place] 48 hours in order for your provider to thoroughly review all the results before contacting the office for clarification of your results.   Please contact [facility] to find out when your next follow up appointment is with them. There phone # is [phone].   Call us in May to set up an August appointment with Dr Ileene Patrick.  I appreciate the opportunity to care for you. Willette Cluster, NP

## 2023-11-30 LAB — CANCER ANTIGEN 19-9: CA 19-9: 25 U/mL (ref <34)

## 2023-12-11 NOTE — Progress Notes (Signed)
 Assessment/Plan   1. Postprocedural hypothyroidism TSH still out of range. Has been working on compliance. Was taking most days but recently down to 3-4 days per week. Will increase dose slightly to 175mcg and have him work on taking med every day Recheck labs in 6 weeks    2. Prediabetes 3. Body mass index (BMI) 35.0-35.9, adult 4. Essential (primary) hypertension 5. Hyperlipidemia, unspecified hyperlipidemia type Norvasc ,  bisoprolol -hydrochlorothiazide , Crestor ,  zetia  Not always complaint with meds Update labs   6. Primary sclerosing cholangitis 7. 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 Cigarette nicotine dependence without complication 10. Cannabis use, unspecified, uncomplicated Daily use   11. Anxiety and Depression Followed by [facility]. Has rx for Abilify  and Zoloft . Atarax  PRN  12. Erectile Dysfunction Change to Viagra 50mg  PRN  Patient expresses understanding of their current medications and use.  If a new prescription was given today, then I discussed potential side effects, drug interactions, instructions for taking the medication, and the consequences of not taking it.   Patient verbalized an understanding of these instructions.  Patient is able to verbalize understanding of the care plan discussed today.   Follow up: 3 months Call sooner if needed.  Subjective   HPI:  Kyle Gonzales is a 33 y.o. male who  presents to the office today for: chronic care follow up.  Thyroid  Hx of Graves and status post radioactive ablation Only taking the thyroid  medication 3-4 days per week and has been trying to be more compliant TSH out of range    Hx of mesenteric thrombosis Not always compliant with eliquis    Primary sclerosis cholangitis,  Chronic Liver disease Followed by hepatic specialist. No fibrosis Will need yearly MRCP and MRI. Completed DEXA No ascites, leg swelling, skin changes.      Obesity/Pre-DM/HTN/HLD Not following diabetic diet.  Not on meds for glycemic control.  Mother has T2DM. Has rx for statin and zetia   Has been on antihypertensives off/on He is currently taking norvasc  and bisoprolol -hctz   BH Followed by [facility].  Has rx for Abilify  and Zoloft . Atarax  PRN Again, he is not always complaint with meds PHQ=20 Stated he does to want to talk about it today   Smoker daily cigarettes and marijuana.    ED Has Rx for cialis  PRN but not helping.  Will change to Viagra 50mg     Review of Systems:  Review of Systems  Constitutional:  Negative for chills, fatigue and fever.  HENT:  Negative for congestion.   Eyes:  Negative for visual disturbance.  Respiratory:  Negative for shortness of breath.   Cardiovascular:  Negative for chest pain and palpitations.  Gastrointestinal:  Negative for abdominal pain.  Endocrine: Negative for cold intolerance and heat intolerance.  Genitourinary:  Negative for difficulty urinating.  Musculoskeletal:  Negative for arthralgias.  Skin:  Negative for rash.  Allergic/Immunologic: Negative for immunocompromised state.  Neurological:  Negative for dizziness.  Hematological:  Negative for adenopathy.  Psychiatric/Behavioral:  Negative for dysphoric mood.       The following portions of the patient's history were reviewed and updated as appropriate: allergies, current medications, past family history, past medical history, past social history, past surgical history and problem list.  Past Medical History:  Diagnosis Date  . Obesity Resolved: [id number]   Obesity     Medications:   Current Outpatient Medications:  .  amLODIPine  (NORVASC ) 2.5 mg tablet, TAKE 1 TABLET BY MOUTH DAILY, Disp:  90 tablet, Rfl: 0 .  ARIPiprazole  (ABILIFY ) 10 mg tablet, Take 10 mg by mouth Once Daily., Disp: , Rfl:  .  bisoprolol -hydroCHLOROthiazide  (ZIAC ) 10-6.25 mg per tablet, TAKE 1 TABLET BY MOUTH DAILY, Disp: 90 tablet,  Rfl: 1 .  Eliquis  5 mg tab, TAKE 1 TABLET(5 MG) BY MOUTH TWICE DAILY, Disp: 60 tablet, Rfl: 3 .  ezetimibe  (ZETIA ) 10 mg tablet, Take      1 tablet       Daily        for Cholesterol    - Must have Office Visit before Refill, Disp: , Rfl:  .  hydrOXYzine  (ATARAX ) 50 mg tablet, Take 50 mg by mouth., Disp: , Rfl:  .  omeprazole  (PriLOSEC) 40 mg DR capsule, Take 1 capsule (40 mg total) by mouth in the morning., Disp: 90 capsule, Rfl: 1 .  rosuvastatin  (CRESTOR ) 10 mg tablet, TAKE 1 TABLET BY MOUTH DAILY, Disp: 90 tablet, Rfl: 0 .  sertraline  (ZOLOFT ) 100 mg tablet, Take 150 mg by mouth., Disp: , Rfl:  .  traZODone  (DESYREL ) 100 mg tablet, Take 100 mg by mouth at bedtime., Disp: , Rfl:  .  levothyroxine  (Synthroid ) 175 mcg tablet, Take 1 tablet (175 mcg total) by mouth in the morning., Disp: 30 tablet, Rfl: 2 .  sildenafiL (Viagra) 50 mg tablet, Take 1 tablet (50 mg total) by mouth daily as needed for erectile dysfunction., Disp: 10 tablet, Rfl: 0   Allergies  Allergen Reactions  . Acetaminophen      Other reaction(s): Other (See Comments) r/t elevated LFTs potential increased LFT's      Social History   Socioeconomic History  . Marital status: Single    Spouse name: Not on file  . Number of children: Not on file  . Years of education: Not on file  . Highest education level: Not on file  Occupational History  . Not on file  Tobacco Use  . Smoking status: Some Days    Types: Cigarettes  . Smokeless tobacco: Former  Substance and Sexual Activity  . Alcohol use: Yes    Comment: 07/04/23: every couple months or so  . Drug use: Yes    Types: Marijuana    Comment: 07/04/23: daily  . Sexual activity: Not on file  Other Topics Concern  . Not on file  Social History Narrative  . Not on file   Social Drivers of Health   Food Insecurity: Medium Risk (12/28/2022)   Food vital sign   . Within the past 12 months, you worried that your food would run out before you got money to buy more:  Sometimes true   . Within the past 12 months, the food you bought just didn't last and you didn't have money to get more: Never true  Transportation Needs: No Transportation Needs (12/28/2022)   Transportation   . In the past 12 months, has lack of reliable transportation kept you from medical appointments, meetings, work or from getting things needed for daily living? : No  Safety: Low Risk  (12/11/2023)   Safety   . How often does anyone, including family and friends, physically hurt you?: Never   . How often does anyone, including family and friends, insult or talk down to you?: Never   . How often does anyone, including family and friends, threaten you with harm?: Never   . How often does anyone, including family and friends, scream or curse at you?: Never  Living Situation: Low Risk  (12/28/2022)  Living Situation   . What is your living situation today?: I have a steady place to live   . Think about the place you live. Do you have problems with any of the following? Choose all that apply:: None/None on this list     Past Surgical History:  Procedure Laterality Date  . APPENDECTOMY  05/2020  . CHOLECYSTECTOMY  09/2018  . COLONOSCOPY  2018  . ERCP W/ SPHINCTEROTOMY AND BALLOON DILATION  2019        Objective   Vitals:   12/11/23 1132  BP: 125/81  Pulse: 98  Temp: 98.6 F (37 C)  SpO2: 93%    Physical Exam:   Physical Exam Vitals reviewed.  HENT:     Head: Normocephalic.  Eyes:     Conjunctiva/sclera: Conjunctivae normal.  Pulmonary:     Effort: Pulmonary effort is normal.  Skin:    General: Skin is warm and dry.  Neurological:     Mental Status: He is alert and oriented to person, place, and time.  Psychiatric:        Mood and Affect: Mood normal.        Behavior: Behavior normal.       Relevant portions of the electronic records were reviewed. Recent and/or relevant labs and imaging are listed below.    Recent Labs:  CrCl cannot be calculated  (Patient's most recent lab result is older than the maximum 10 days allowed.).   Lab Results  Component Value Date   WBC 7.40 08/28/2023   HGB 14.3 08/28/2023   HCT 43.4 08/28/2023   PLT 179 08/28/2023   CHOL 181 08/28/2023   TRIG 77 08/28/2023   HDL 40 (L) 08/28/2023   ALT 40 08/28/2023   AST 35 08/28/2023   NA 139 08/28/2023   K 4.4 08/28/2023   CL 103 08/28/2023   CREATININE 1.15 08/28/2023   BUN 10 08/28/2023   CO2 27 08/28/2023   TSH 36.548 (H) 12/07/2023   INR 1.0 12/28/2022   GLUCOSE 90 08/28/2023   HGBA1C 6.2 (H) 08/28/2023       Recent Imaging:    DEXA Scan 1 or More Sites Axial Narrative: EXAM: DUAL X-RAY ABSORPTIOMETRY (DXA) FOR BONE MINERAL DENSITY 11/23/2023 2:49 pm  CLINICAL DATA:  33 year old Male primary sclerosis cholangitis and increased risk of osteoporosis, Primary sclerosing cholangitis  TECHNIQUE: An axial (e.g., hips, spine) and/or appendicular (e.g., radius) exam was performed, as appropriate, using [employer] forces technical [employer] at [employer]. Images are obtained for bone mineral density measurement and are not obtained for diagnostic purposes. [id number]  Exclusions: None.  COMPARISON:  None.  FINDINGS: Scan quality: Good.  LUMBAR SPINE (L1-L4):  BMD (in g/cm2): 1.314  Z-score: 1.1  LEFT FEMORAL NECK:  BMD (in g/cm2): 0.961  Z-score: -0.5  LEFT TOTAL HIP:  BMD (in g/cm2): 1.162  Z-score: 0.1 Impression: Bone mineral density is within expected range for age.  RECOMMENDATIONS: 1. All patients should optimize calcium  and vitamin D  intake.  2. Patients with diagnosis of osteoporosis or at high risk for fracture should have regular bone mineral density tests. For patients eligible for Medicare, routine testing is allowed once every 2 years. The testing frequency can be increased to one year for patients who have rapidly progressing disease, those who are receiving or discontinuing medical therapy to restore bone  mass or have additional risk factors.  Electronically Signed   By: Reyes Phi M.D.   On: 11/23/2023 15:22  Signature   I agree that the documentation is accurate and complete.  Electronically signed by: Franky Ozell Molt, PA-C 12/11/2023 12:11 PM  This document serves as a record of services personally performed by Franky Molt, PA-C.  It was created on their behalf by Mitzie LITTIE Amabile, CMA, a trained medical scribe, and Certified Medical Assistant (CMA). During the course of documenting the history, physical exam and medical decision making, I was functioning as a Stage manager. The creation of this record is the provider's dictation and/or activities during the visit.  Electronically signed by Mitzie LITTIE Amabile, CMA 12/11/2023 11:27 AM

## 2023-12-18 ENCOUNTER — Other Ambulatory Visit: Payer: Self-pay | Admitting: Gastroenterology

## 2023-12-18 DIAGNOSIS — K219 Gastro-esophageal reflux disease without esophagitis: Secondary | ICD-10-CM

## 2023-12-27 NOTE — Progress Notes (Signed)
 [facility] - [facility]  Patient Information  Location Information: Patient State (at time of visit): [place]  Patient Location (at time of visit):Home/Other Non-Medical  Provider Location: Home Is provider licensed to provide clinical care in the current location/state of the patient? Yes  Consent  Consent:  Patient's identity was confirmed. Presenting condition or illness was discussed with the patient/personal representative. Current proposed treatment for presenting condition or illness was explained to patient/personal representative along with the likely benefits and any significant risks or complications associated with the provision of treatment by audio/video means. The patient/personal representative verbally authorized treatment to be provided by audio/video, which may include a limited review of patient's current health status, medication, or other treatment recommendations, patient education, and an opportunity to ask questions about condition and treatment. Verbal Consent Granted by Patient/Personal Representative:Yes   Visit Information: Video start/stop time: Start time: 12/27/2023  1:21 PM EDT End time: 12/27/2023  1:29 PM EDT  Video duration: 54m 00s  History of Present Illness   History of Present Illness The patient is a 33 year old male presenting via virtual visit for right-sided neck pain.   He reports experiencing pain in the right side of his neck, which is exacerbated during coughing. The pain, described as achy, extends into his right collar bone and neck area. He does not experience any associated headaches, dizziness, or shortness of breath. He has not attempted any heat or ice therapy for relief. This discomfort, which began approximately 3 days ago, is also triggered by certain movements or positions, particularly when his arm is extended in a specific manner. The pain intensifies when he raises his arm above his head  and experiences discomfort with turning his head. Despite some relief from ibuprofen , the pain persists. He notes that he sleeps on his couch at night but does not believe this to be the cause of his symptoms. He does not experience any tenderness upon palpation of the affected area and reports no stiffness compared to the contralateral side. He has not experienced similar symptoms in the past.  Video Exam  Physical Exam Constitutional:      Appearance: Normal appearance.  HENT:     Nose: Nose normal. No rhinorrhea.  Eyes:     General:        Right eye: No discharge.        Left eye: No discharge.     Extraocular Movements: Extraocular movements intact.     Conjunctiva/sclera: Conjunctivae normal.  Pulmonary:     Effort: Pulmonary effort is normal. No respiratory distress.     Breath sounds: No stridor.     Comments: Able to speak in fluid sentences with no gasping.  No audible wheeze. Musculoskeletal:     Cervical back: No tenderness. Pain with movement present.  Skin:    Coloration: Skin is not jaundiced or pale.     Findings: No erythema or rash.  Neurological:     General: No focal deficit present.     Mental Status: He is alert and oriented to person, place, and time.  Psychiatric:        Mood and Affect: Mood normal.        Behavior: Behavior normal.        Thought Content: Thought content normal.    Diagnosis and Medical Decision Making  1. Neck pain on right side (Primary) - methylPREDNISolone (Medrol, Pak,) 4 mg 6 day dose pack; Take As Directed On Package  Dispense: 21 tablet;  Refill: 0   Assessment & Plan 1. Right-sided neck pain: Acute. - Prescribe a steroid for anti-inflammatory purposes, to be sent to [facility] on [address]. - Discontinue ibuprofen  while on the steroid; may resume post-treatment. - Provide stretches and exercises via the online portal. - Consider physical therapy if no improvement.  Follow-up - Follow up with PCP  as previously scheduled on 03/11/2024.   Disposition  If condition(s) worsens, or not improving Pietro needs re-evaluation Disposition:  Patient to continue care at home.

## 2024-01-02 ENCOUNTER — Other Ambulatory Visit: Payer: Self-pay | Admitting: Nurse Practitioner

## 2024-01-02 DIAGNOSIS — K8301 Primary sclerosing cholangitis: Secondary | ICD-10-CM

## 2024-01-02 NOTE — Progress Notes (Signed)
 [facility] [place]  Patient: Kyle Gonzales Age: 33 y.o.        DOB: May 14, 1991  MRN: [id number] Sex: male      DATE: 01/02/24    Referring Provider:  Leigh Elspeth SQUIBB, MD   [address]   [address]   [place], [place] [address]   [phone] (Work)   [phone] (Fax)   Primary Care Provider:  Franky Ozell Molt, PA-C [address] [place] [address] Phone: [phone] Fax: [phone]   Subjective   Kyle Gonzales is a 33 y.o. Black or [place] American male who is alone and serves as his own historian.  Kyle Gonzales was last seen by me at [facility] on 07/04/2023 and is here today for follow up.  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  Patient has a long history of abnormal liver enzymes.  He had an MRCP in May 2018 for evaluation for [facility] which noted mild diffuse intrahepatic biliary ductal dilatation and cavernous transformation of the portal vein and splenomegaly.  Imaging was reviewed at [facility] and was felt to be consistent with PSC.  He previously had a percutaneous liver biopsy with no abnormality and no fibrosis.      Patient had colonoscopy on 06/25/2017 to screen for inflammatory bowel disease.  Colonoscopy was unremarkable with no evidence of inflammatory disease and random biopsy showed benign colonic mucosa.  He had an ERCP on 08/19/2018 with a 6-8 mm long focal CBD stricture that was dilated and a sphincterotomy was also performed.  At that time his CA 19-9 was elevated at 207.  Brushings performed at time of that ERCP showed no malignant cells.  He had a lap cholecystectomy in December 2019.  Recent CA 19-9 was within normal limits.  Admits to only taking his medications 2-3 days a week.  I just get tired of taking them.  He complains of significant fatigue.  He continues to have an elevated TSH but admits to not taking his medications routinely.  He  denies any hospital or emergency room visits.  He is currently seeing a psychiatrist for management of depression and cannabis addiction.  He reports he is also trying to find a therapist to work with.   History of Present Illness The patient is a 33 year old male who presents for follow-up of primary sclerosing cholangitis.  General Well-being - Reports a general sense of well-being since his last visit in the fall - No hospital or emergency room admissions - No abdominal pain, right-sided discomfort, or fevers - Does not have any metallic implants or pacemakers - Uncertain about his primary care physician but believes he is affiliated with [facility] [address] in [place]  Nausea - Occasionally experiences nausea, predominantly in the mornings - Nausea tends to subside after eating or drinking - Does not experience any associated headaches  Substance Use - Admits to smoking marijuana cigarettes - Does not use vape pens  Supplemental information: He had a cramping pain in his collar bone area and had a virtual call with a doctor who said it was a muscle issue and prescribed him some medicine. It has gotten better.  SOCIAL HISTORY The patient smokes marijuana.      Patient Active Problem List  Diagnosis  . Drug addiction (CMD)  . Gastroesophageal reflux disease  . Generalized anxiety disorder  . Graves' disease  . Hyperlipidemia  .  Hypertension  . Hypothyroidism  . 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 Mesenteric thrombosis (CMD)  . Primary sclerosing cholangitis  . Protein S deficiency (CMD)  . Class 2 obesity due to excess calories with body mass index (BMI) of 35.0 to 35.9 in adult    Past Surgical History:  Procedure Laterality Date  . APPENDECTOMY  05/2020  . CHOLECYSTECTOMY  09/2018  . COLONOSCOPY  2018  . ERCP W/ SPHINCTEROTOMY AND BALLOON DILATION  2019    Family History  Problem Relation Name Age of Onset   . Diabetes Mother    . Hypertrophic cardiomyopathy Mother      Tobacco Use     Some Days; Types: Cigarettes   Smokeless Tobacco: Former user of smokeless tobacco.      Alcohol Use     Yes.   Comments: 07/04/23: every couple months or so      Drug Use     Yes; Marijuana.   Comments: 07/04/23: daily       Review of Systems  Constitutional:  Negative for fatigue, fever and unexpected weight change.  Respiratory:  Negative for cough and shortness of breath.   Cardiovascular:  Negative for chest pain.  Gastrointestinal:  Negative for abdominal distention, abdominal pain, blood in stool and vomiting.  Skin:  Negative for color change.  Neurological:  Negative for tremors and speech difficulty.  Psychiatric/Behavioral:  Positive for dysphoric mood. Negative for confusion, decreased concentration, sleep disturbance and suicidal ideas.   All other systems reviewed and are negative.    Allergies  Allergen Reactions  . Acetaminophen      Other reaction(s): Other (See Comments) r/t elevated LFTs potential increased LFT's     Current Outpatient Medications  Medication Instructions  . amLODIPine  (NORVASC ) 2.5 mg, oral, Daily  . ARIPiprazole  (ABILIFY ) 10 mg, Daily  . bisoprolol -hydroCHLOROthiazide  (ZIAC ) 10-6.25 mg per tablet 1 tablet, oral, Daily  . Eliquis  5 mg, oral  . ezetimibe  (ZETIA ) 10 mg tablet Take      1 tablet       Daily        for Cholesterol    - Must have Office Visit before Refill  . hydrOXYzine  (ATARAX ) 50 mg  . levothyroxine  (SYNTHROID ) 175 mcg, oral, Daily at 0600  . omeprazole  (PRILOSEC) 40 mg, oral, Daily at 0600  . rosuvastatin  (CRESTOR ) 10 mg, oral, Daily  . sertraline  (ZOLOFT ) 150 mg  . sildenafiL (VIAGRA) 50 mg, oral, Daily PRN  . traZODone  (DESYREL ) 100 mg, oral, As needed    Objective:    Vitals:   01/02/24 1138  BP: 111/67  Pulse: 81  Temp: 99 F (37.2 C)  SpO2: 100%    Height: 1.905 m (6' 3) (01/02/2024 11:38 AM)  Weight: 130 kg  (286 lb 9 oz) (01/02/2024 11:38 AM)  Body mass index is 35.82 kg/m.  Physical Exam Vitals reviewed.  Constitutional:      General: He is not in acute distress.    Appearance: Normal appearance.  Eyes:     General: No scleral icterus.    Extraocular Movements: Extraocular movements intact.     Pupils: Pupils are equal, round, and reactive to light.  Cardiovascular:     Rate and Rhythm: Normal rate and regular rhythm.     Heart sounds: No murmur heard. Pulmonary:     Effort: Pulmonary effort is normal. No respiratory distress.     Breath sounds: Normal breath sounds.  Abdominal:     General: There  is no distension.     Palpations: Abdomen is soft. There is no shifting dullness, fluid wave, hepatomegaly, splenomegaly or mass.     Tenderness: There is no abdominal tenderness.     Hernia: No hernia is present.  Musculoskeletal:     Right lower leg: No edema.     Left lower leg: No edema.  Lymphadenopathy:     Cervical: No cervical adenopathy.  Skin:    General: Skin is warm and dry.     Coloration: Skin is not jaundiced.  Neurological:     Mental Status: He is alert and oriented to person, place, and time.     Motor: No tremor.     Gait: Gait is intact.  Psychiatric:        Mood and Affect: Mood is depressed. Affect is flat.        Behavior: Behavior is slowed and withdrawn.        Thought Content: Thought content does not include suicidal ideation.        Judgment: Judgment normal.      LABS:       IMAGING:   02/16/2023 MRI ABDOMEN WITHOUT AND WITH CONTRAST   TECHNIQUE:  Multiplanar multisequence MR imaging of the abdomen was performed  both before and after the administration of intravenous contrast.   CONTRAST:  10 mL Vueway    COMPARISON:  10/22/2021   FINDINGS:  Lower chest: No acute findings.   Hepatobiliary: Several small benign hemangiomas in the posterior  right and central left hepatic lobes remains stable. No suspicious  liver masses are  identified. Marked caudate lobe hypertrophy is  again seen. Chronic portal vein thrombosis with cavernous  transformation is again seen.   Prior cholecystectomy noted. Mild dilatation of intrahepatic bile  ducts is again seen. Irregular contour of intra and extrahepatic  bile ducts again noted, consistent with history of sclerosing  cholangitis.   Pancreas: No mass or inflammatory changes. No evidence of pancreatic  ductal dilatation.   Spleen:  Within normal limits in size and appearance.   Adrenals/Urinary Tract: No suspicious masses identified. No evidence  of hydronephrosis.   Stomach/Bowel: Unremarkable.   Vascular/Lymphatic: No pathologically enlarged lymph nodes  identified. No acute vascular findings.   Other:  None.   Musculoskeletal:  No suspicious bone lesions identified.   IMPRESSION:  Stable findings of sclerosing cholangitis. No evidence of malignancy  or other acute findings.   Stable small benign hepatic hemangiomas.   Chronic portal vein thrombosis with cavernous transformation.   PROCEDURES: 11/23/2023 DEXA scan: Bone mineral density is within expected range for age.   02/2022 Colonoscopy - The examined portion of the ileum was normal. - Normal mucosa in the entire examined colon. - The entire examined colon is normal on direct and retroflexion views. - Several biopsies were obtained in the sigmoid colon, in the transverse colon and in the ascending colon.  1. Surgical [p], colon, ascending - unremarkable colonic mucosa. - no active inflammation, chronic changes or granulomas. 2. Surgical [p], colon, tranverse - unremarkable colonic mucosa. - no active inflammation, chronic changes or granulomas. 3. Surgical [p], colon, sigmoid - unremarkable colonic mucosa. - no active inflammation, chronic changes or granulomas.    Assessment / Plan:   Problem List Items Addressed This Visit     Drug addiction (CMD)   He reports using marijuana but does not use  a vape pen. He is advised against the use of vape pens due to the risk of severe acute  liver injury. General health risks associated with marijuana use, including mental health issues, addiction, and potential lung infections, were discussed. There is currently no literature establishing an association between marijuana use and cholangiocarcinoma in [facility].      Primary sclerosing cholangitis - Primary   Patient with PSC based on radiologic findings.  Liver biopsy was stage 0 fibrosis and no abnormal findings.         He has cavernous transformation of the portal vein.        I again reviewed with the patient the natural history and progression of PSC.  I explained that most patients have advanced fibrosis at time of diagnosis whereas in his case liver biopsy estimated no fibrosis, that in his situation with no evidence of fibrosis on biopsy he is very likely to not require transplant any time in the near future.  I did discuss that he will need MRI/MRCP annually for cholangiocarcinoma screening.  He should also undergo bone density testing which can be done by primary care there is a higher risk of osteoporosis in patients with PSC.    He is currently due for annual surveillance MRI and will be scheduled.        It is important to note that the patient does have a chronic medical condition which will require lifelong follow-up.       Relevant Orders   MRI Abdomen W And WO Contrast    PLAN: MRI/MRCP Follow up in 1 year  Stephane Quest, MSN, NP, A-GPCNP-BC Nurse Practitioner  [facility] [place] Office: [phone]  Fax: [phone]   [facility]  [facility] is [facility]  Mailing: [address], [place], [place] [address]

## 2024-01-30 ENCOUNTER — Other Ambulatory Visit

## 2024-02-01 ENCOUNTER — Other Ambulatory Visit

## 2024-02-05 ENCOUNTER — Inpatient Hospital Stay: Admission: RE | Admit: 2024-02-05 | Source: Ambulatory Visit

## 2024-02-15 ENCOUNTER — Other Ambulatory Visit

## 2024-02-22 ENCOUNTER — Other Ambulatory Visit: Payer: Self-pay

## 2024-02-22 DIAGNOSIS — K219 Gastro-esophageal reflux disease without esophagitis: Secondary | ICD-10-CM

## 2024-02-22 MED ORDER — OMEPRAZOLE 40 MG PO CPDR: 40.0000 mg | DELAYED_RELEASE_CAPSULE | Freq: Every day | ORAL | 5 refills | Status: AC

## 2024-02-22 NOTE — Telephone Encounter (Signed)
 Omeprazole  refilled as pharmacy requested. Seen in Jan 2025.

## 2024-03-16 ENCOUNTER — Ambulatory Visit
Admission: RE | Admit: 2024-03-16 | Discharge: 2024-03-16 | Disposition: A | Discharge status: Home / Self Care | Source: Ambulatory Visit | Attending: Nurse Practitioner

## 2024-03-16 DIAGNOSIS — K8301 Primary sclerosing cholangitis: Secondary | ICD-10-CM

## 2024-03-16 MED ORDER — GADOPICLENOL 0.5 MMOL/ML IV SOLN
10.0000 mL | Freq: Once | INTRAVENOUS | Status: AC | PRN
  Administered 2024-03-16: 10 mL via INTRAVENOUS

## 2024-03-18 NOTE — Progress Notes (Signed)
 Assessment/Plan   1. Postprocedural hypothyroidism (Primary) Not taking his thyroid  medication on a consistent basis Will defer rechecking TFTs today and not making further dose changes  2. Body mass index (BMI) 35.0-35.9, adult 3. Prediabetes 4. Essential (primary) hypertension 5. Hyperlipidemia, unspecified hyperlipidemia type Norvasc ,  bisoprolol -hydrochlorothiazide , Crestor ,  zetia  Not always complaint with meds  6. Primary sclerosing cholangitis (CMD) 7. Liver disease, unspecified 8. Mesenteric venous thrombosis (CMD) Per GI   9. Cigarette nicotine dependence without complication Recently stopped  10. Cannabis use, unspecified, uncomplicated Still smoking marijuana  11. Anxiety and depression Previously followed by [facility].  Has rx for Abilify  and Zoloft . Atarax  PRN  Plans to find new [facility] provider and already called new location yesterday  12. Erectile dysfunction, unspecified erectile dysfunction type  Viagra 50mg  PRN   Patient expresses understanding of their current medications and use.  If a new prescription was given today, then I discussed potential side effects, drug interactions, instructions for taking the medication, and the consequences of not taking it.   Patient verbalized an understanding of these instructions.  Patient is able to verbalize understanding of the care plan discussed today.   Follow up: 3 months Call sooner if needed.  Subjective   HPI:  Kyle Gonzales is a 33 y.o. male who  presents to the office today for: chronic care management.  Thyroid  Hx of Graves and status post radioactive ablation TSH consistently out of range. He again tells me that he is not taking his synthroid  daily. May only take a few days in the week. His mother has contacted us  previously to state that patient is taking med and wanted referral to endocrinology but patient told me today that if he was being honest, he does not take the medicaiton and does  not want referral. His reasoning is that he simply does not care about taking his medications anymore.   BH Previously Followed by [facility] but told me that he contacted a new BH provider yesterday and plans to complete intake forms today Has rx for Abilify  and Zoloft . Atarax  PRN Again, he is not always complaint with meds  Hx of mesenteric thrombosis Not always compliant with eliquis    Primary sclerosis cholangitis,  Chronic Liver disease Followed by hepatic specialist. No fibrosis Will need yearly MRCP and MRI. Completed DEXA No ascites, leg swelling, skin changes.      Obesity/Pre-DM/HTN/HLD Not following diabetic diet.  Not on meds for glycemic control.  Mother has T2DM. Has rx for statin and zetia   Has been on antihypertensives off/on He is currently taking norvasc  and bisoprolol -hctz    Smoker Previously daily cigarettes and marijuana but today tells me that he does not use cigarettes anymore only using marijuana   ED Changed from cialis  to Viagra 50mg  last time     Review of Systems:  Review of Systems  Constitutional:  Negative for chills, fatigue and fever.  HENT:  Negative for congestion.   Eyes:  Negative for visual disturbance.  Respiratory:  Negative for shortness of breath.   Cardiovascular:  Negative for chest pain and palpitations.  Gastrointestinal:  Negative for abdominal pain.  Endocrine: Negative for cold intolerance and heat intolerance.  Genitourinary:  Negative for difficulty urinating.  Musculoskeletal:  Negative for arthralgias.  Skin:  Negative for rash.  Allergic/Immunologic: Negative for immunocompromised state.  Neurological:  Negative for dizziness.  Hematological:  Negative for adenopathy.  Psychiatric/Behavioral:  Negative for dysphoric mood.       The  following portions of the patient's history were reviewed and updated as appropriate: allergies, current medications, past family history, past medical history, past  social history, past surgical history and problem list.  Past Medical History:  Diagnosis Date  . Obesity Resolved: [id number]   Obesity     Medications:   Current Outpatient Medications:  .  amLODIPine  (NORVASC ) 2.5 mg tablet, TAKE 1 TABLET BY MOUTH DAILY, Disp: 90 tablet, Rfl: 0 .  ARIPiprazole  (ABILIFY ) 10 mg tablet, Take 10 mg by mouth Once Daily., Disp: , Rfl:  .  bisoprolol -hydroCHLOROthiazide  (ZIAC ) 10-6.25 mg per tablet, TAKE 1 TABLET BY MOUTH DAILY, Disp: 90 tablet, Rfl: 1 .  Eliquis  5 mg tab, TAKE 1 TABLET(5 MG) BY MOUTH TWICE DAILY, Disp: 60 tablet, Rfl: 3 .  ezetimibe  (ZETIA ) 10 mg tablet, Take      1 tablet       Daily        for Cholesterol    - Must have Office Visit before Refill, Disp: , Rfl:  .  hydrOXYzine  (ATARAX ) 50 mg tablet, Take 50 mg by mouth., Disp: , Rfl:  .  levothyroxine  (Synthroid ) 200 mcg tablet, Take 1 tablet (200 mcg total) by mouth in the morning., Disp: 90 tablet, Rfl: 3 .  omeprazole  (PriLOSEC) 40 mg DR capsule, Take 1 capsule (40 mg total) by mouth in the morning., Disp: 90 capsule, Rfl: 1 .  rosuvastatin  (CRESTOR ) 10 mg tablet, TAKE 1 TABLET BY MOUTH DAILY, Disp: 90 tablet, Rfl: 0 .  sertraline  (ZOLOFT ) 100 mg tablet, Take 150 mg by mouth., Disp: , Rfl:  .  sildenafiL (Viagra) 50 mg tablet, Take 1 tablet (50 mg total) by mouth daily as needed for erectile dysfunction., Disp: 10 tablet, Rfl: 0 .  traZODone  (DESYREL ) 100 mg tablet, Take 100 mg by mouth as needed., Disp: , Rfl:    Allergies  Allergen Reactions  . Acetaminophen      Other reaction(s): Other (See Comments) r/t elevated LFTs potential increased LFT's      Social History   Socioeconomic History  . Marital status: Single    Spouse name: Not on file  . Number of children: Not on file  . Years of education: Not on file  . Highest education level: Not on file  Occupational History  . Not on file  Tobacco Use  . Smoking status: Former    Types: Cigarettes  . Smokeless tobacco:  Former  . Tobacco comments:    Quit smoking cigarettes October 2024.  Substance and Sexual Activity  . Alcohol use: Yes    Comment: every couple months or so  . Drug use: Yes    Types: Marijuana    Comment: daily  . Sexual activity: Not on file  Other Topics Concern  . Not on file  Social History Narrative  . Not on file   Social Drivers of Health   Food Insecurity: Medium Risk (12/28/2022)   Food vital sign   . Within the past 12 months, you worried that your food would run out before you got money to buy more: Sometimes true   . Within the past 12 months, the food you bought just didn't last and you didn't have money to get more: Never true  Transportation Needs: No Transportation Needs (12/28/2022)   Transportation   . In the past 12 months, has lack of reliable transportation kept you from medical appointments, meetings, work or from getting things needed for daily living? : No  Safety: Low Risk  (  12/11/2023)   Safety   . How often does anyone, including family and friends, physically hurt you?: Never   . How often does anyone, including family and friends, insult or talk down to you?: Never   . How often does anyone, including family and friends, threaten you with harm?: Never   . How often does anyone, including family and friends, scream or curse at you?: Never  Living Situation: Low Risk  (12/28/2022)   Living Situation   . What is your living situation today?: I have a steady place to live   . Think about the place you live. Do you have problems with any of the following? Choose all that apply:: None/None on this list     Past Surgical History:  Procedure Laterality Date  . APPENDECTOMY  05/2020  . CHOLECYSTECTOMY  09/2018  . COLONOSCOPY  2018  . ERCP W/ SPHINCTEROTOMY AND BALLOON DILATION  2019        Objective   Vitals:   03/18/24 1156  BP: 135/85  Pulse: 100  Temp: 99.1 F (37.3 C)  SpO2: 95%    Physical Exam:   Physical Exam Vitals reviewed.   HENT:     Head: Normocephalic.  Eyes:     Conjunctiva/sclera: Conjunctivae normal.  Pulmonary:     Effort: Pulmonary effort is normal.  Skin:    General: Skin is warm and dry.  Neurological:     Mental Status: He is alert and oriented to person, place, and time.  Psychiatric:        Mood and Affect: Mood is depressed.        Behavior: Behavior is withdrawn.       Relevant portions of the electronic records were reviewed. Recent and/or relevant labs and imaging are listed below.    Recent Labs:  CrCl cannot be calculated (Patient's most recent lab result is older than the maximum 10 days allowed.).   Lab Results  Component Value Date   WBC 7.40 08/28/2023   HGB 14.3 08/28/2023   HCT 43.4 08/28/2023   PLT 179 08/28/2023   CHOL 207 (H) 01/25/2024   TRIG 136 01/25/2024   HDL 43 (L) 01/25/2024   ALT 31 01/25/2024   AST 23 01/25/2024   NA 139 01/25/2024   K 4.0 01/25/2024   CL 106 01/25/2024   CREATININE 1.07 01/25/2024   BUN 12 01/25/2024   CO2 26 01/25/2024   TSH 15.099 (H) 01/25/2024   INR 1.0 12/28/2022   GLUCOSE 106 (H) 01/25/2024   HGBA1C 6.1 (H) 01/25/2024       Recent Imaging:    DEXA Scan 1 or More Sites Axial Narrative: EXAM: DUAL X-RAY ABSORPTIOMETRY (DXA) FOR BONE MINERAL DENSITY 11/23/2023 2:49 pm  CLINICAL DATA:  33 year old Male primary sclerosis cholangitis and increased risk of osteoporosis, Primary sclerosing cholangitis  TECHNIQUE: An axial (e.g., hips, spine) and/or appendicular (e.g., radius) exam was performed, as appropriate, using Armed forces technical officer at [employer]. Images are obtained for bone mineral density measurement and are not obtained for diagnostic purposes. [id number]  Exclusions: None.  COMPARISON:  None.  FINDINGS: Scan quality: Good.  LUMBAR SPINE (L1-L4):  BMD (in g/cm2): 1.314  Z-score: 1.1  LEFT FEMORAL NECK:  BMD (in g/cm2): 0.961  Z-score: -0.5  LEFT TOTAL HIP:  BMD (in g/cm2):  1.162  Z-score: 0.1 Impression: Bone mineral density is within expected range for age.  RECOMMENDATIONS: 1. All patients should optimize calcium  and vitamin D  intake.  2. Patients with diagnosis of osteoporosis or at high risk for fracture should have regular bone mineral density tests. For patients eligible for Medicare, routine testing is allowed once every 2 years. The testing frequency can be increased to one year for patients who have rapidly progressing disease, those who are receiving or discontinuing medical therapy to restore bone mass or have additional risk factors.  Electronically Signed   By: Reyes Phi M.D.   On: 11/23/2023 15:22     Signature   I agree that the documentation is accurate and complete.  Electronically signed by: Franky Ozell Molt, PA-C 03/18/2024 12:18 PM     This document serves as a record of services personally performed by Franky Molt, PA-C.  It was created on their behalf by Mitzie LITTIE Amabile, CMA, a trained medical scribe, and Certified Medical Assistant (CMA). During the course of documenting the history, physical exam and medical decision making, I was functioning as a Stage manager. The creation of this record is the provider's dictation and/or activities during the visit.  Electronically signed by Mitzie LITTIE Amabile, CMA 03/18/2024 11:52 AM

## 2024-03-23 ENCOUNTER — Other Ambulatory Visit (HOSPITAL_COMMUNITY): Payer: Self-pay | Admitting: Psychiatry

## 2024-03-23 DIAGNOSIS — F1994 Other psychoactive substance use, unspecified with psychoactive substance-induced mood disorder: Secondary | ICD-10-CM

## 2024-05-07 ENCOUNTER — Ambulatory Visit (HOSPITAL_COMMUNITY)
Admission: EM | Admit: 2024-05-07 | Discharge: 2024-05-08 | Disposition: A | Discharge status: Home / Self Care | Attending: Psychiatry | Admitting: Psychiatry

## 2024-05-07 DIAGNOSIS — F22 Delusional disorders: Secondary | ICD-10-CM | POA: Diagnosis present

## 2024-05-07 DIAGNOSIS — F1994 Other psychoactive substance use, unspecified with psychoactive substance-induced mood disorder: Secondary | ICD-10-CM | POA: Diagnosis not present

## 2024-05-07 DIAGNOSIS — F333 Major depressive disorder, recurrent, severe with psychotic symptoms: Secondary | ICD-10-CM | POA: Insufficient documentation

## 2024-05-07 LAB — CBC WITH DIFFERENTIAL/PLATELET
Abs Immature Granulocytes: 0.08 K/uL — ABNORMAL HIGH (ref 0.00–0.07)
Basophils Absolute: 0 K/uL (ref 0.0–0.1)
Basophils Relative: 0 %
Eosinophils Absolute: 0 K/uL (ref 0.0–0.5)
Eosinophils Relative: 1 %
HCT: 47.1 % (ref 39.0–52.0)
Hemoglobin: 15.3 g/dL (ref 13.0–17.0)
Immature Granulocytes: 1 %
Lymphocytes Relative: 12 %
Lymphs Abs: 1 K/uL (ref 0.7–4.0)
MCH: 29.8 pg (ref 26.0–34.0)
MCHC: 32.5 g/dL (ref 30.0–36.0)
MCV: 91.8 fL (ref 80.0–100.0)
Monocytes Absolute: 0.8 K/uL (ref 0.1–1.0)
Monocytes Relative: 9 %
Neutro Abs: 6.7 K/uL (ref 1.7–7.7)
Neutrophils Relative %: 77 %
Platelets: 224 K/uL (ref 150–400)
RBC: 5.13 MIL/uL (ref 4.22–5.81)
RDW: 14.4 % (ref 11.5–15.5)
WBC: 8.6 K/uL (ref 4.0–10.5)
nRBC: 0 % (ref 0.0–0.2)

## 2024-05-07 LAB — URINALYSIS, ROUTINE W REFLEX MICROSCOPIC
Bilirubin Urine: NEGATIVE
Glucose, UA: NEGATIVE mg/dL
Hgb urine dipstick: NEGATIVE
Ketones, ur: NEGATIVE mg/dL
Leukocytes,Ua: NEGATIVE
Nitrite: NEGATIVE
Protein, ur: NEGATIVE mg/dL
Specific Gravity, Urine: 1.023 (ref 1.005–1.030)
pH: 5 (ref 5.0–8.0)

## 2024-05-07 LAB — COMPREHENSIVE METABOLIC PANEL WITH GFR
ALT: 63 U/L — ABNORMAL HIGH (ref 0–44)
AST: 39 U/L (ref 15–41)
Albumin: 4.4 g/dL (ref 3.5–5.0)
Alkaline Phosphatase: 45 U/L (ref 38–126)
Anion gap: 13 (ref 5–15)
BUN: 7 mg/dL (ref 6–20)
CO2: 21 mmol/L — ABNORMAL LOW (ref 22–32)
Calcium: 10 mg/dL (ref 8.9–10.3)
Chloride: 103 mmol/L (ref 98–111)
Creatinine, Ser: 1.04 mg/dL (ref 0.61–1.24)
GFR, Estimated: >60 mL/min (ref >60)
Glucose, Bld: 100 mg/dL — ABNORMAL HIGH (ref 70–99)
Potassium: 4.3 mmol/L (ref 3.5–5.1)
Sodium: 137 mmol/L (ref 135–145)
Total Bilirubin: 0.8 mg/dL (ref 0.0–1.2)
Total Protein: 7.8 g/dL (ref 6.5–8.1)

## 2024-05-07 LAB — TSH: TSH: 26.518 u[IU]/mL — ABNORMAL HIGH (ref 0.350–4.500)

## 2024-05-07 LAB — LIPID PANEL
Cholesterol: 233 mg/dL — ABNORMAL HIGH (ref 0–200)
HDL: 46 mg/dL (ref >40)
LDL Cholesterol: 158 mg/dL — ABNORMAL HIGH (ref 0–99)
Total CHOL/HDL Ratio: 5.1 ratio
Triglycerides: 144 mg/dL (ref <150)
VLDL: 29 mg/dL (ref 0–40)

## 2024-05-07 LAB — POCT URINE DRUG SCREEN - MANUAL ENTRY (I-SCREEN)
POC Amphetamine UR: NOT DETECTED
POC Buprenorphine (BUP): NOT DETECTED
POC Cocaine UR: NOT DETECTED
POC Marijuana UR: POSITIVE — AB
POC Methadone UR: NOT DETECTED
POC Methamphetamine UR: NOT DETECTED
POC Morphine: NOT DETECTED
POC Oxazepam (BZO): NOT DETECTED
POC Oxycodone UR: NOT DETECTED
POC Secobarbital (BAR): NOT DETECTED

## 2024-05-07 LAB — MAGNESIUM: Magnesium: 1.9 mg/dL (ref 1.7–2.4)

## 2024-05-07 LAB — HEMOGLOBIN A1C
Hgb A1c MFr Bld: 6.1 % — ABNORMAL HIGH (ref 4.8–5.6)
Mean Plasma Glucose: 128.37 mg/dL

## 2024-05-07 LAB — ETHANOL: Alcohol, Ethyl (B): <15 mg/dL (ref <15)

## 2024-05-07 MED ORDER — LORAZEPAM 2 MG/ML IJ SOLN: 2.0000 mg | Freq: Three times a day (TID) | INTRAMUSCULAR | Status: DC | PRN

## 2024-05-07 MED ORDER — ARIPIPRAZOLE 10 MG PO TABS
10.0000 mg | ORAL_TABLET | Freq: Every day | ORAL | Status: DC
  Administered 2024-05-07 – 2024-05-08 (×2): 10 mg via ORAL
  Filled 2024-05-07 (×2): qty 1

## 2024-05-07 MED ORDER — HALOPERIDOL 5 MG PO TABS: 5.0000 mg | ORAL_TABLET | Freq: Three times a day (TID) | ORAL | Status: DC | PRN

## 2024-05-07 MED ORDER — DIPHENHYDRAMINE HCL 50 MG PO CAPS: 50.0000 mg | ORAL_CAPSULE | Freq: Three times a day (TID) | ORAL | Status: DC | PRN

## 2024-05-07 MED ORDER — HALOPERIDOL LACTATE 5 MG/ML IJ SOLN: 5.0000 mg | Freq: Three times a day (TID) | INTRAMUSCULAR | Status: DC | PRN

## 2024-05-07 MED ORDER — LEVOTHYROXINE SODIUM 100 MCG PO TABS
100.0000 ug | ORAL_TABLET | Freq: Every day | ORAL | Status: DC
  Administered 2024-05-08: 100 ug via ORAL
  Filled 2024-05-07: qty 1

## 2024-05-07 MED ORDER — RISPERIDONE 1 MG PO TABS
1.0000 mg | ORAL_TABLET | Freq: Two times a day (BID) | ORAL | Status: DC
  Administered 2024-05-07 – 2024-05-08 (×3): 1 mg via ORAL
  Filled 2024-05-07 (×3): qty 1

## 2024-05-07 MED ORDER — DIPHENHYDRAMINE HCL 50 MG/ML IJ SOLN: 50.0000 mg | Freq: Three times a day (TID) | INTRAMUSCULAR | Status: DC | PRN

## 2024-05-07 MED ORDER — EZETIMIBE 10 MG PO TABS
10.0000 mg | ORAL_TABLET | Freq: Every day | ORAL | Status: DC
  Administered 2024-05-07 – 2024-05-08 (×2): 10 mg via ORAL
  Filled 2024-05-07 (×2): qty 1

## 2024-05-07 MED ORDER — HYDROXYZINE HCL 25 MG PO TABS: 25.0000 mg | ORAL_TABLET | Freq: Three times a day (TID) | ORAL | Status: DC | PRN

## 2024-05-07 MED ORDER — APIXABAN 5 MG PO TABS
5.0000 mg | ORAL_TABLET | Freq: Two times a day (BID) | ORAL | Status: DC
  Administered 2024-05-07 – 2024-05-08 (×3): 5 mg via ORAL
  Filled 2024-05-07 (×3): qty 1

## 2024-05-07 MED ORDER — ALUM & MAG HYDROXIDE-SIMETH 200-200-20 MG/5ML PO SUSP: 30.0000 mL | ORAL | Status: DC | PRN

## 2024-05-07 MED ORDER — ROSUVASTATIN CALCIUM 5 MG PO TABS
10.0000 mg | ORAL_TABLET | Freq: Every day | ORAL | Status: DC
  Administered 2024-05-07 – 2024-05-08 (×2): 10 mg via ORAL
  Filled 2024-05-07 (×2): qty 2

## 2024-05-07 MED ORDER — SERTRALINE HCL 100 MG PO TABS
100.0000 mg | ORAL_TABLET | Freq: Every day | ORAL | Status: DC
  Administered 2024-05-07 – 2024-05-08 (×2): 100 mg via ORAL
  Filled 2024-05-07 (×2): qty 1

## 2024-05-07 MED ORDER — HALOPERIDOL LACTATE 5 MG/ML IJ SOLN: 10.0000 mg | Freq: Three times a day (TID) | INTRAMUSCULAR | Status: DC | PRN

## 2024-05-07 MED ORDER — MAGNESIUM HYDROXIDE 400 MG/5ML PO SUSP: 30.0000 mL | Freq: Every day | ORAL | Status: DC | PRN

## 2024-05-07 NOTE — ED Provider Notes (Signed)
 [facility] Continuous Assessment Admission H&P  Date: 05/07/24 Patient Name: Kyle Gonzales MRN: [id number] Chief Complaint: Honestly...people are talking behind my back and it brings bad energy  Diagnoses:  Final diagnoses:  Paranoid state (HCC)    HPI: Per triage: Pt presents to [facility] accompanied by his mother. Pt states he is depressed for a while now and has not been taking his medication consistently. Pt mentions that he is looking for therapy and to get back on his medications so that he doesnt feel such a depressed mood. Pt states that he is dealing with a lot of intrusive thoughts and just wants to talk to one person about his probelms. Pt denies alcohol use, Si, Hi and AVH. However, pt does reports smoking marijuana daily.   Per chart review, patient has a medical hx of Graves disease , Hypothyroidism and HTN. Previously serviced at [facility] .  Hx of mesenteric thrombosis. Not always compliant with eliquis . Chronic liver disease. Obesity and high cholesterol. Also prescribed viagra 50 mg .   Per patient's mother Kyle Gonzales [phone] Patient has a hx of depression and anxiety and was prescribed Abilify , Zoloft  and Hydroxyzine . He also takes Syntroid, Eliquis , and other BP medications.  Kyle Gonzales does not take his medications on a regular basis and starting him on injection before he comes back home would help.  His thinking has not been right lately, he needs help, his is not living on this planet, his thinking is odd, he does not take care of himself and ...not doing well he needs help.   Assessment: Patient is evaluated face-to-face by this clinician after chart/nursing note review. 33 year old male pacing in the assessment room, restless, anxious. He is claiming that  honestly, people can pick on your energy, judge you, they come into my head, they know what I am thinking.  Patient reports that he is afraid to get around other people because they steal his thoughts. When  asked how long this has been happening, patient states  since [place] died on 3rd eye, when he died, that's when it happened.  Patient's thought process is disorganized and speech is pressured. Reports that he is currently unemployed because people read his mind  I don't like to be around many people. Admits to smoking Marijuana heavily, and drinking alcohol occasionally. Speech is pressured, disorganized. Eye contact is fair. Patient appears preoccupied, anxious and restless. He denies SI/HI. Denies hearing voices but reports that his mind is hacked I want to know if I am alright, I feel like I am  getting judged, everywhere.SABRASABRAPatient denies thoughts of self-harm. Denies homicidal ideations. Denies hx of abuse and has no legal issues.   Patient presents with symptoms of disturbed thought process. He is experiencing active paranoia along with recurrent depression. He meets criteria for Inpatient stabilization. We will admit him to Observation unit while working on disposition. We will restart his home medications. We will add Risperidone  1 mg PO BID to address his psychotic symptoms and will continue to provide support and encouragements.   Total Time spent with patient: 1 hour  Musculoskeletal  Strength & Muscle Tone: within normal limits Gait & Station: normal Patient leans: N/A  Psychiatric Specialty Exam  Presentation General Appearance:  Casual  Eye Contact: Fair  Speech: Pressured  Speech Volume: Decreased  Handedness: Right   Mood and Affect  Mood: Anxious  Affect: Blunt   Thought Process  Thought Processes: Irrevelant  Descriptions of Associations:No data recorded Orientation:Partial  Thought Content:Illogical    Hallucinations:Hallucinations: None  Ideas of Reference:None  Suicidal Thoughts:Suicidal Thoughts: No  Homicidal Thoughts:Homicidal Thoughts: No   Sensorium  Memory: Immediate Fair; Recent Fair; Remote  Fair  Judgment: Poor  Insight: Poor   Executive Functions  Concentration: Poor  Attention Span: Fair  Recall: [employer] of Knowledge: Fair  Language: Fair   Psychomotor Activity  Psychomotor Activity:No data recorded  Assets  Assets: Communication Skills; Desire for Improvement; Housing; Physical Health; Social Support   Sleep  Sleep: Sleep: Good   Nutritional Assessment (For OBS and FBC admissions only) Has the patient had a weight loss or gain of 10 pounds or more in the last 3 months?: No Has the patient had a decrease in food intake/or appetite?: Yes Does the patient have dental problems?: No Does the patient have eating habits or behaviors that may be indicators of an eating disorder including binging or inducing vomiting?: No Has the patient recently lost weight without trying?: 0 Has the patient been eating poorly because of a decreased appetite?: 1 Malnutrition Screening Tool Score: 1    Physical Exam Vitals and nursing note reviewed.  HENT:     Head: Normocephalic and atraumatic.     Right Ear: Tympanic membrane normal.     Left Ear: Tympanic membrane normal.     Nose: Nose normal.     Mouth/Throat:     Mouth: Mucous membranes are moist.  Eyes:     Extraocular Movements: Extraocular movements intact.     Pupils: Pupils are equal, round, and reactive to light.  Cardiovascular:     Rate and Rhythm: Normal rate.     Pulses: Normal pulses.  Pulmonary:     Effort: Pulmonary effort is normal.  Musculoskeletal:        General: Normal range of motion.     Cervical back: Normal range of motion and neck supple.  Neurological:     General: No focal deficit present.     Mental Status: He is alert and oriented to person, place, and time.    Review of Systems  Constitutional: Negative.   Eyes: Negative.   Respiratory: Negative.    Cardiovascular: Negative.   Gastrointestinal: Negative.   Genitourinary: Negative.   Musculoskeletal:  Negative.   Skin: Negative.   Neurological: Negative.   Endo/Heme/Allergies: Negative.   Psychiatric/Behavioral:  Positive for depression and hallucinations. The patient is nervous/anxious.     Blood pressure 139/79, pulse 75, temperature 98.4 F (36.9 C), resp. rate 17, SpO2 100%. There is no height or weight on file to calculate BMI.  Past Psychiatric History: MDD, Anxiety   Is the patient at risk to self? No  Has the patient been a risk to self in the past 6 months? No .    Has the patient been a risk to self within the distant past? No   Is the patient a risk to others? No   Has the patient been a risk to others in the past 6 months? No   Has the patient been a risk to others within the distant past? No   Past Medical History: Hypothyroidism, Hyperlipidemia, Chronic anticoagulation, DVT, HTN  Family History: Mom has anxiety  Social History: Lives with mom, unemployed  Last Labs:  Appointment on 11/29/2023  Component Date Value Ref Range Status   CA 19-9 11/29/2023 25  <34 U/mL Final   Comment: . This test was performed using the Siemens  chemiluminescent method. Values obtained from different assay methods  cannot be used interchangeably. CA 19-9 levels, regardless of value, should not be interpreted as absolute evidence of the presence or absence of disease. .    INR 11/29/2023 1.1 (H)  0.8 - 1.0 ratio Final   Prothrombin Time 11/29/2023 11.6  9.6 - 13.1 sec Final   WBC 11/29/2023 7.3  4.0 - 10.5 K/uL Final   RBC 11/29/2023 4.87  4.22 - 5.81 Mil/uL Final   Platelets 11/29/2023 209.0  150.0 - 400.0 K/uL Final   Hemoglobin 11/29/2023 15.0  13.0 - 17.0 g/dL Final   HCT 97/86/7974 45.1  39.0 - 52.0 % Final   MCV 11/29/2023 92.5  78.0 - 100.0 fl Final   MCHC 11/29/2023 33.3  30.0 - 36.0 g/dL Final   RDW 97/86/7974 14.7  11.5 - 15.5 % Final   Sodium 11/29/2023 138  135 - 145 mEq/L Final   Potassium 11/29/2023 3.9  3.5 - 5.1 mEq/L Final   Chloride 11/29/2023 103  96 -  112 mEq/L Final   CO2 11/29/2023 27  19 - 32 mEq/L Final   Glucose, Bld 11/29/2023 107 (H)  70 - 99 mg/dL Final   BUN 97/86/7974 11  6 - 23 mg/dL Final   Creatinine, Ser 11/29/2023 1.24  0.40 - 1.50 mg/dL Final   Total Bilirubin 11/29/2023 0.7  0.2 - 1.2 mg/dL Final   Alkaline Phosphatase 11/29/2023 43  39 - 117 U/L Final   AST 11/29/2023 29  0 - 37 U/L Final   ALT 11/29/2023 37  0 - 53 U/L Final   Total Protein 11/29/2023 8.4 (H)  6.0 - 8.3 g/dL Final   Albumin 97/86/7974 5.0  3.5 - 5.2 g/dL Final   GFR 97/86/7974 77.65  >60.00 mL/min Final   Calculated using the CKD-EPI Creatinine Equation (2021)   Calcium  11/29/2023 9.6  8.4 - 10.5 mg/dL Final    Allergies: Tylenol  [acetaminophen ]  Medications:  Facility Ordered Medications  Medication   alum & mag hydroxide-simeth (MAALOX/MYLANTA) 200-200-20 MG/5ML suspension 30 mL   magnesium  hydroxide (MILK OF MAGNESIA) suspension 30 mL   haloperidol  (HALDOL ) tablet 5 mg   And   diphenhydrAMINE  (BENADRYL ) capsule 50 mg   haloperidol  lactate (HALDOL ) injection 5 mg   And   diphenhydrAMINE  (BENADRYL ) injection 50 mg   And   LORazepam  (ATIVAN ) injection 2 mg   haloperidol  lactate (HALDOL ) injection 10 mg   And   diphenhydrAMINE  (BENADRYL ) injection 50 mg   And   LORazepam  (ATIVAN ) injection 2 mg   hydrOXYzine  (ATARAX ) tablet 25 mg   ARIPiprazole  (ABILIFY ) tablet 10 mg   sertraline  (ZOLOFT ) tablet 100 mg   [START ON 05/08/2024] levothyroxine  (SYNTHROID ) tablet 100 mcg   ezetimibe  (ZETIA ) tablet 10 mg   rosuvastatin  (CRESTOR ) tablet 10 mg   apixaban  (ELIQUIS ) tablet 5 mg   risperiDONE  (RISPERDAL ) tablet 1 mg   PTA Medications  Medication Sig   Cholecalciferol  (VITAMIN D3) 5000 units CAPS Take 1 capsule (5,000 Units total) by mouth every other day. (Patient taking differently: Take 5,000 Units by mouth daily.)   apixaban  (ELIQUIS ) 5 MG TABS tablet TAKE 1 TABLET(5 MG) BY MOUTH TWICE DAILY   levothyroxine  (SYNTHROID ) 100 MCG tablet Take  1 tablet   Daily   on an empty stomach with only water for 30 minutes & no Antacid meds, Calcium  or Magnesium  for 4 hours & avoid Biotin   ezetimibe  (ZETIA ) 10 MG tablet Take  1 tablet  Daily  for Cholesterol   rosuvastatin  (CRESTOR ) 10 MG tablet Take  1 tablet Daily for Cholesterol   tadalafil  (CIALIS ) 20 MG tablet Take  1/2 to 1 tablet  every 2 to 3 days  as needed for XXXX   Multiple Vitamins-Minerals (MENS MULTIVITAMIN) TABS Take 1 tablet by mouth daily.   bisoprolol -hydrochlorothiazide  (ZIAC ) 10-6.25 MG tablet Take 1 tablet Daily for BP - Last Refill . Chart closed .   oxyCODONE  (OXY IR/ROXICODONE ) 5 MG immediate release tablet Take 1 tablet (5 mg total) by mouth every 6 (six) hours as needed for moderate pain, severe pain or breakthrough pain.   ARIPiprazole  (ABILIFY ) 10 MG tablet Take 1 tablet (10 mg total) by mouth daily.   sertraline  (ZOLOFT ) 100 MG tablet Take 1.5 tablets (150 mg total) by mouth daily.   omeprazole  (PRILOSEC) 40 MG capsule Take 1 capsule (40 mg total) by mouth daily.      Medical Decision Making  Admit to Observation unit. Inpatient recommended Initiate agitation protocol PRN: Maalox 30 ml PO Q 4 PRN, Milk of Magnesia 30 ml PO Daily PRN, Hydroxyzine  25 mg PO TID PRN.   New medication: Risperidone  1 mg PO BID  Home medications:  Abilify  10 mg PO Daily Levothyroxine  100 mcg PO Daily before breakfast Sertraline  100 mg PO Daily Apixaban  5 mg PO BID Ezetimibe  10 mg PO daily Rosuvastatin  10 mg PO Daily     Recommendations  Based on my evaluation the patient does not appear to have an emergency medical condition.  Randall Bouquet, NP 05/07/24  1:16 PM

## 2024-05-07 NOTE — Progress Notes (Signed)
 Pt is admitted to [facility] due to depression. Pt is alert and oriented with flat affect. Pt is ambulatory and is oriented to staff/unit. Pt was cooperative with labs and skin assessment. Small rash were noted on pt's bilateral upper arms. Pt denies pain and current SI/HI/AVH, plan or intent. Staff will monitor for pt's safety.

## 2024-05-07 NOTE — Progress Notes (Signed)
   05/07/24 1107  [facility] Triage Screening (Walk-ins at [facility] only)  How Did You Hear About Us ? Family/Friend  What Is the Reason for Your Visit/Call Today? Pt presents to [facility] accompanied by his mother. Pt states he is depressed for a while now and has not been taking his medication consistently. Pt mentions that he is looking for therapy and to get back on his medications so that he doesnt feel such a depressed mood. Pt states that he is dealing with a lot of intrusive thoughts and just wants to talk to one person about his probelms. Pt denies alcohol use, Si, Hi and AVH. However, pt does reports smoking marijuana daily.  How Long Has This Been Causing You Problems? <Week  Have You Recently Had Any Thoughts About Hurting Yourself? No  Are You Planning to Commit Suicide/Harm Yourself At This time? No  Have you Recently Had Thoughts About Hurting Someone Sherral? No  Are You Planning To Harm Someone At This Time? No  Physical Abuse Denies  Verbal Abuse Denies  Sexual Abuse Denies  Exploitation of patient/patient's resources Denies  Self-Neglect Denies  Possible abuse reported to: Other (Comment)  Are you currently experiencing any auditory, visual or other hallucinations? No  Have You Used Any Alcohol or Drugs in the Past 24 Hours? Yes  What Did You Use and How Much? marijuana  Do you have any current medical co-morbidities that require immediate attention? No  Clinician description of patient physical appearance/behavior: tearful, cooperative  What Do You Feel Would Help You the Most Today? Medication(s)  If access to [facility] was not available, would you have sought care in the Emergency Department? No  Determination of Need Routine (7 days)  Options For Referral Outpatient Therapy;Medication Management

## 2024-05-08 DIAGNOSIS — F333 Major depressive disorder, recurrent, severe with psychotic symptoms: Secondary | ICD-10-CM | POA: Diagnosis not present

## 2024-05-08 LAB — RPR: RPR Ser Ql: NONREACTIVE

## 2024-05-08 MED ORDER — ARIPIPRAZOLE 5 MG PO TABS: 5.0000 mg | ORAL_TABLET | Freq: Every day | ORAL | 0 refills | Status: AC

## 2024-05-08 MED ORDER — ARIPIPRAZOLE ER 400 MG IM PRSY
400.0000 mg | PREFILLED_SYRINGE | INTRAMUSCULAR | Status: DC
  Administered 2024-05-08: 400 mg via INTRAMUSCULAR

## 2024-05-08 NOTE — ED Notes (Signed)
 Patient alert & oriented x4. Denies intent to harm self or others when asked. Denies A/VH. Patient denies any physical complaints when asked. No acute distress noted. Support and encouragement provided. Patient observed in milieu. No inappropriate behaviors seen or reported. Patient polite and respectful with a pleasant demeanor. Routine safety checks conducted per facility protocol. Encouraged patient to notify staff if any thoughts of harm towards self or others arise. Patient verbalizes understanding and agreement.

## 2024-05-08 NOTE — ED Provider Notes (Signed)
 FBC/OBS ASAP Discharge Summary  Date and Time: 05/09/2024 12:53 PM  Name: Kyle Gonzales  MRN:  [id number]   Discharge Diagnoses:  Final diagnoses:  Severe episode of recurrent major depressive disorder, with psychotic features (HCC)   Subjective: I will get the injection because I don't do good taking pills  Stay Summary:  Pt admitted to [facility] observation unit due to worsening depression and medication management at the urging of his mother who accompanied patient during initial presentation. Provider admitted patient, he a few hours later requested discharge, however, admission provider was able to talk patient into remaining over for observation. Patient on reassessment, denies any suicidal or homicidal thoughts today and denies during admission to observation. Patient again voices that he needs more appropriate outpatient follow-up with therapists and psychiatrist. Patient home medications include Abilify  and he suffers form severe hypothyroidism and endorses poor compliance with bot medications. Discussed at length with patient the importance of taking Synthroid  for hypothyroidism as untreated thyroid  disease can contribute significantly to worsening depression. Patient verbalizes understanding. Patient gives this Clinical research associate to call mom to discuss plan of transitioning to Abilify  LAI. Patient has been prescribed Abilify  for >6 months without any adverse effects or identifiable intolerances.    Spoke with mother Henning Ehle to discuss, discharge, LAI transition and follow-up outpatient treatment. Patient has been referred and scheduled with [facility] 05/09/2024 to establish with new medication management provider and therapist. Next dose of Abilify  Maintena scheduled for 06/05/2024. Mother verbalized understanding and denies any opposition to discharge.   The patient is currently stable for discharge.  The patient offers no new complaints on the day of discharge.  Although  there is no empirical evidence to suggest that safety contracting is an effective deterrent, it was used with this patient as a means of further assessing any underlying ambivalence towards their own safety. Patient did not appear ambivalent or apprehensive. Patient currently verbally contracts for safety outside the structure of the hospital setting.  While contracting for safety the patient maintained genuine affect, appropriate eye contact, and voice was natural without hesitance.  Patient has been provided with ER precautions concerning new or worsening depression and suicidal ideation. Patient presents with upbeat attitude and is forward focused.  Patient is considered psychiatrically stable for discharge today. Total Time spent with patient: 45 minutes  Past Psychiatric History:  Past Medical History:  Family History:  Family Psychiatric History:  Social History:  Tobacco Cessation:  N/A, patient does not currently use tobacco products  Current Medications:  No current facility-administered medications for this encounter.   Current Outpatient Medications  Medication Sig Dispense Refill   amLODipine  (NORVASC ) 2.5 MG tablet Take 2.5 mg by mouth daily.     apixaban  (ELIQUIS ) 5 MG TABS tablet TAKE 1 TABLET(5 MG) BY MOUTH TWICE DAILY 180 tablet 0   Cholecalciferol  (VITAMIN D3) 5000 units CAPS Take 1 capsule (5,000 Units total) by mouth every other day. (Patient taking differently: Take 5,000 Units by mouth daily.)     levothyroxine  (SYNTHROID ) 200 MCG tablet Take 200 mcg by mouth daily.     rosuvastatin  (CRESTOR ) 10 MG tablet Take 1 tablet Daily for Cholesterol 90 tablet 1   ARIPiprazole  (ABILIFY ) 5 MG tablet Take 1 tablet (5 mg total) by mouth daily for 15 days. 15 tablet 0   bisoprolol -hydrochlorothiazide  (ZIAC ) 10-6.25 MG tablet Take 1 tablet Daily for BP - Last Refill . Chart closed . 30 tablet 0   ezetimibe  (ZETIA ) 10 MG tablet Take  1 tablet  Daily  for Cholesterol 90 tablet 1    Multiple Vitamins-Minerals (MENS MULTIVITAMIN) TABS Take 1 tablet by mouth daily.     omeprazole  (PRILOSEC) 40 MG capsule Take 1 capsule (40 mg total) by mouth daily. 30 capsule 5   sildenafil (VIAGRA) 50 MG tablet Take 50 mg by mouth as needed for erectile dysfunction.      PTA Medications:  PTA Medications  Medication Sig   Cholecalciferol  (VITAMIN D3) 5000 units CAPS Take 1 capsule (5,000 Units total) by mouth every other day. (Patient taking differently: Take 5,000 Units by mouth daily.)   apixaban  (ELIQUIS ) 5 MG TABS tablet TAKE 1 TABLET(5 MG) BY MOUTH TWICE DAILY   rosuvastatin  (CRESTOR ) 10 MG tablet Take 1 tablet Daily for Cholesterol   amLODipine  (NORVASC ) 2.5 MG tablet Take 2.5 mg by mouth daily.   ezetimibe  (ZETIA ) 10 MG tablet Take  1 tablet  Daily  for Cholesterol   Multiple Vitamins-Minerals (MENS MULTIVITAMIN) TABS Take 1 tablet by mouth daily.   bisoprolol -hydrochlorothiazide  (ZIAC ) 10-6.25 MG tablet Take 1 tablet Daily for BP - Last Refill . Chart closed .   omeprazole  (PRILOSEC) 40 MG capsule Take 1 capsule (40 mg total) by mouth daily.   sildenafil (VIAGRA) 50 MG tablet Take 50 mg by mouth as needed for erectile dysfunction.   ARIPiprazole  (ABILIFY ) 5 MG tablet Take 1 tablet (5 mg total) by mouth daily for 15 days.       05/07/2024    1:16 PM 10/05/2021   10:42 AM 07/14/2021   11:40 AM  Depression screen PHQ 2/9  Decreased Interest 1 1 3   Down, Depressed, Hopeless 1 2 3   PHQ - 2 Score 2 3 6   Altered sleeping 1 1 2   Tired, decreased energy 1 1 1   Change in appetite 1 2 2   Feeling bad or failure about yourself  0 3 3  Trouble concentrating 1 3 2   Moving slowly or fidgety/restless 1 1 1   Suicidal thoughts 0 0 1  PHQ-9 Score 7 14 18   Difficult doing work/chores Somewhat difficult Somewhat difficult Very difficult    Flowsheet Row ED from 05/07/2024 in [facility] Admission (Discharged) from 02/15/2022 in [facility] [place] [facility] ED to  Hosp-Admission (Discharged) from 12/15/2021 in [facility] MEDICAL UNIT  C-SSRS RISK CATEGORY No Risk No Risk No Risk    Musculoskeletal  Strength & Muscle Tone: within normal limits Gait & Station: normal Patient leans: None   Psychiatric Specialty Exam  Presentation  General Appearance:  Casual  Eye Contact: Fair  Speech: Pressured  Speech Volume: Decreased  Handedness: Right   Mood and Affect  Mood: Anxious  Affect: Blunt   Thought Process  Thought Processes:  Descriptions of Associations: Intact   Orientation: x 3   Thought Content: Linear      Hallucinations: None   Ideas of Reference:None  Suicidal Thoughts: None  Homicidal Thoughts: None    Sensorium  Memory: Immediate Fair; Recent Fair; Remote Fair  Judgment: Poor  Insight: Poor   Executive Functions  Concentration: Fair   Attention Span: Fair  Recall: [employer] of Knowledge: Fair  Language: Fair   Psychomotor Activity  Psychomotor Activity:No data recorded  Assets  Assets: Communication Skills; Desire for Improvement; Housing; Physical Health; Social Support   Sleep  Sleep: Fair      Physical Exam  Physical Exam Constitutional:      General: He is not in  acute distress.    Appearance: Normal appearance. He is not ill-appearing.  HENT:     Head: Normocephalic and atraumatic.     Nose: Nose normal.  Eyes:     Extraocular Movements: Extraocular movements intact.     Conjunctiva/sclera: Conjunctivae normal.     Pupils: Pupils are equal, round, and reactive to light.  Cardiovascular:     Rate and Rhythm: Normal rate and regular rhythm.  Pulmonary:     Effort: Pulmonary effort is normal.     Breath sounds: Normal breath sounds.  Musculoskeletal:     Cervical back: Normal range of motion and neck supple.  Skin:    General: Skin is warm and dry.  Neurological:     General: No focal deficit present.     Mental Status: He is  alert and oriented to person, place, and time.     Review of Systems  Psychiatric/Behavioral:  Positive for depression and substance abuse. Negative for hallucinations and suicidal ideas. The patient is not nervous/anxious and does not have insomnia.     Blood pressure 135/83, pulse 98, temperature 98.1 F (36.7 C), temperature source Oral, resp. rate 18, SpO2 99%. There is no height or weight on file to calculate BMI.  Demographic Factors:  NA  Loss Factors: NA  Historical Factors: NA  Risk Reduction Factors:   NA  Continued Clinical Symptoms:  More than one psychiatric diagnosis  Cognitive Features That Contribute To Risk:  None    Suicide Risk:  Minimal: No identifiable suicidal ideation.  Patients presenting with no risk factors but with morbid ruminations; may be classified as minimal risk based on the severity of the depressive symptoms  Plan Of Care/Follow-up recommendations:  -[facility] 05/09/2024,  -Abilify  400 mg Maintena IM LAI, next 8/21 -Abilify  PO reduced from 10 mg - 5 mg, continue for 15 days. -Patient tolerated injection and was monitored for 45 minutes following injections. -Return if any worsening mental health symptoms develop.  Disposition: Home to Self   Suzen Lesches, NP 05/09/2024, 12:53 PM

## 2024-05-08 NOTE — ED Notes (Signed)
 Patient denied breakfast

## 2024-05-08 NOTE — ED Notes (Signed)
 Patient discharged home w/ OP services. AVS reviewed, no questions at this time. Patient belongings retunred from locker #13 complete and intact. Staff escorted patient to lobby for transport to their destination. Safety maintained.

## 2024-05-08 NOTE — ED Notes (Signed)
 Patient observed/assessed in bed/chair resting quietly appearing in no distress and verbalizing no complaints at this time. Will continue to monitor.

## 2024-05-08 NOTE — Progress Notes (Signed)
 Pt has been accepted to [facility] on 05/08/2024 Bed assignment:   Pt meets inpatient criteria per: Starlyn Patron NP  Attending Physician will be: Dr. Nash MD  Report can be called to: [phone]  Pt can arrive after 2 PM today   Care Team Notified: Suzen Lesches NP, [place] Ward RN  [place] Veronika Heard LCSW-A   05/08/2024 10:13 AM

## 2024-05-08 NOTE — Discharge Instructions (Addendum)
 Long-acting injectable given today, Abilify  400 mg next dose due on 06/05/2024.  [facility] and they will set you up an appointment to have your next injection.  Abilify  injectable has been sent to your pharmacy and will be ready for pickup on or around August 20th to fill at pharmacy and take to your next appointment.  You will continue taking Abilify  5 mg daily for 15 days to allow time for the injection to reach effectiveness.  Continuing any other medical medications that you are currently prescribed.  If you have any worsening symptoms of depression or develop any other acute psychiatric symptoms you may always return here to [facility] behavioral health or office is available 24 hours a day 7 days a week.

## 2024-05-08 NOTE — ED Notes (Signed)
 Patient resting in lounger with eyes closed, respirations even and unlabored. Patient in no apparent acute distress. Environment secured. Safety checks in place per facility protocol.

## 2024-05-08 NOTE — Progress Notes (Signed)
 At this time patient wants to DC. Per provider patient does not meet criteria for IVC at this time.  Patient does not have any current SI/HI/ .CSW has updated the facility.    [place] Nitisha Civello LCSW-A   05/08/2024 10:48 AM

## 2024-05-08 NOTE — Progress Notes (Signed)
 At this time patient wants to DC. Per provider patient does not meet criteria for IVC at this time.  Patient does not have any current SI/HI/ .CSW has updated the facility.    [place] Jaquille Kau LCSW-A   05/08/2024 10:49 AM

## 2024-05-08 NOTE — Progress Notes (Signed)
 Inpatient Psychiatric Referral  Patient was recommended inpatient per Starlyn Patron, NP . There are no available beds at [facility], per [facility]. Patient was referred to the following out of network facilities:  Destination  Service Provider Request Status Address Phone Fax  [facility]  Pending - Request Sent [address] [place] [address] [phone]  [facility]  Pending - Request Sent [address] [place] [address] [phone]  [facility]  Pending - Request Sent [address] [place] [address] [phone]  [facility]  Pending - Request Sent [address], [place] [address] [phone]  [facility]  Pending - Request Sent [address] [place] [address] [phone]  [facility]  Pending - Request Sent [address] [place] [phone]  [facility]  Pending - Request Sent [address] [place] [address] [phone]    Situation ongoing, CSW to continue following and update chart as more information becomes available.   Harrie Sofia MSW, LCSWA 05/08/2024  12:21AM

## 2024-08-11 ENCOUNTER — Other Ambulatory Visit (HOSPITAL_COMMUNITY): Payer: Self-pay | Admitting: Psychiatry

## 2024-08-11 DIAGNOSIS — F1994 Other psychoactive substance use, unspecified with psychoactive substance-induced mood disorder: Secondary | ICD-10-CM
